# Patient Record
Sex: Female | Born: 1950
Health system: Southern US, Community
[De-identification: ages and names within clinical notes are randomized; demographics above are authoritative.]

## PROBLEM LIST (undated history)

## (undated) DIAGNOSIS — I2510 Atherosclerotic heart disease of native coronary artery without angina pectoris: Secondary | ICD-10-CM

## (undated) DIAGNOSIS — IMO0001 Reserved for inherently not codable concepts without codable children: Secondary | ICD-10-CM

## (undated) DIAGNOSIS — F419 Anxiety disorder, unspecified: Secondary | ICD-10-CM

## (undated) DIAGNOSIS — M2550 Pain in unspecified joint: Secondary | ICD-10-CM

## (undated) DIAGNOSIS — I509 Heart failure, unspecified: Secondary | ICD-10-CM

## (undated) DIAGNOSIS — G43019 Migraine without aura, intractable, without status migrainosus: Secondary | ICD-10-CM

## (undated) DIAGNOSIS — D751 Secondary polycythemia: Secondary | ICD-10-CM

## (undated) DIAGNOSIS — G43909 Migraine, unspecified, not intractable, without status migrainosus: Secondary | ICD-10-CM

## (undated) DIAGNOSIS — D649 Anemia, unspecified: Secondary | ICD-10-CM

## (undated) DIAGNOSIS — M533 Sacrococcygeal disorders, not elsewhere classified: Secondary | ICD-10-CM

## (undated) DIAGNOSIS — Z9289 Personal history of other medical treatment: Secondary | ICD-10-CM

## (undated) DIAGNOSIS — I7300 Raynaud's syndrome without gangrene: Secondary | ICD-10-CM

## (undated) DIAGNOSIS — T8859XA Other complications of anesthesia, initial encounter: Secondary | ICD-10-CM

## (undated) DIAGNOSIS — Z860100 Personal history of colon polyps, unspecified: Secondary | ICD-10-CM

## (undated) DIAGNOSIS — R200 Anesthesia of skin: Secondary | ICD-10-CM

## (undated) DIAGNOSIS — R112 Nausea with vomiting, unspecified: Secondary | ICD-10-CM

## (undated) DIAGNOSIS — I219 Acute myocardial infarction, unspecified: Secondary | ICD-10-CM

## (undated) DIAGNOSIS — Z9889 Other specified postprocedural states: Secondary | ICD-10-CM

## (undated) DIAGNOSIS — F329 Major depressive disorder, single episode, unspecified: Secondary | ICD-10-CM

## (undated) DIAGNOSIS — M797 Fibromyalgia: Secondary | ICD-10-CM

## (undated) DIAGNOSIS — K219 Gastro-esophageal reflux disease without esophagitis: Secondary | ICD-10-CM

## (undated) DIAGNOSIS — Z973 Presence of spectacles and contact lenses: Secondary | ICD-10-CM

## (undated) DIAGNOSIS — M419 Scoliosis, unspecified: Secondary | ICD-10-CM

## (undated) DIAGNOSIS — K589 Irritable bowel syndrome without diarrhea: Secondary | ICD-10-CM

## (undated) DIAGNOSIS — M2540 Effusion, unspecified joint: Secondary | ICD-10-CM

## (undated) DIAGNOSIS — F32A Depression, unspecified: Secondary | ICD-10-CM

## (undated) DIAGNOSIS — Z87442 Personal history of urinary calculi: Secondary | ICD-10-CM

## (undated) HISTORY — PX: TONSILLECTOMY AND ADENOIDECTOMY: SUR1326

## (undated) HISTORY — DX: Acute myocardial infarction, unspecified: I21.9

## (undated) HISTORY — PX: ABDOMINAL HYSTERECTOMY: SHX81

## (undated) HISTORY — PX: COLONOSCOPY: SHX174

## (undated) HISTORY — PX: KNEE DEBRIDEMENT: SHX1894

## (undated) HISTORY — PX: DEBRIDEMENT TENNIS ELBOW: SHX1442

## (undated) HISTORY — PX: DILATION AND CURETTAGE OF UTERUS: SHX78

## (undated) HISTORY — PX: STAPEDECTOMY: SHX2435

## (undated) HISTORY — PX: LAPAROTOMY: SHX154

## (undated) HISTORY — PX: TYMPANOPLASTY: SHX33

## (undated) HISTORY — PX: DIAGNOSTIC LAPAROSCOPY: SUR761

## (undated) HISTORY — PX: TUBAL LIGATION: SHX77

## (undated) HISTORY — DX: Migraine without aura, intractable, without status migrainosus: G43.019

## (undated) HISTORY — PX: CARDIAC CATHETERIZATION: SHX172

## (undated) HISTORY — PX: ELBOW SURGERY: SHX618

## (undated) HISTORY — PX: APPENDECTOMY: SHX54

---

## 1999-09-17 ENCOUNTER — Other Ambulatory Visit: Admission: RE | Admit: 1999-09-17 | Discharge: 1999-09-17 | Payer: Self-pay | Admitting: Orthopedic Surgery

## 2000-11-30 ENCOUNTER — Encounter (INDEPENDENT_AMBULATORY_CARE_PROVIDER_SITE_OTHER): Payer: Self-pay | Admitting: *Deleted

## 2000-11-30 ENCOUNTER — Ambulatory Visit (HOSPITAL_COMMUNITY): Admission: RE | Admit: 2000-11-30 | Discharge: 2000-11-30 | Payer: Self-pay | Admitting: Gastroenterology

## 2002-02-06 ENCOUNTER — Encounter: Admission: RE | Admit: 2002-02-06 | Discharge: 2002-02-06 | Payer: Self-pay | Admitting: Otolaryngology

## 2002-02-06 ENCOUNTER — Encounter: Payer: Self-pay | Admitting: Otolaryngology

## 2002-10-10 ENCOUNTER — Encounter: Payer: Self-pay | Admitting: Gastroenterology

## 2002-10-10 ENCOUNTER — Encounter: Admission: RE | Admit: 2002-10-10 | Discharge: 2002-10-10 | Payer: Self-pay | Admitting: Gastroenterology

## 2002-10-28 ENCOUNTER — Encounter (INDEPENDENT_AMBULATORY_CARE_PROVIDER_SITE_OTHER): Payer: Self-pay

## 2002-10-28 ENCOUNTER — Observation Stay (HOSPITAL_COMMUNITY): Admission: RE | Admit: 2002-10-28 | Discharge: 2002-10-29 | Payer: Self-pay | Admitting: Surgery

## 2002-10-28 HISTORY — PX: CHOLECYSTECTOMY: SHX55

## 2003-04-14 ENCOUNTER — Encounter: Payer: Self-pay | Admitting: Gastroenterology

## 2003-04-14 ENCOUNTER — Ambulatory Visit (HOSPITAL_COMMUNITY): Admission: RE | Admit: 2003-04-14 | Discharge: 2003-04-14 | Payer: Self-pay | Admitting: Gastroenterology

## 2003-04-30 ENCOUNTER — Ambulatory Visit (HOSPITAL_COMMUNITY): Admission: RE | Admit: 2003-04-30 | Discharge: 2003-04-30 | Payer: Self-pay | Admitting: Gastroenterology

## 2003-06-23 ENCOUNTER — Encounter: Payer: Self-pay | Admitting: Gastroenterology

## 2003-06-23 ENCOUNTER — Encounter: Admission: RE | Admit: 2003-06-23 | Discharge: 2003-06-23 | Payer: Self-pay | Admitting: Gastroenterology

## 2004-08-06 ENCOUNTER — Other Ambulatory Visit: Admission: RE | Admit: 2004-08-06 | Discharge: 2004-08-06 | Payer: Self-pay | Admitting: Obstetrics and Gynecology

## 2007-03-09 ENCOUNTER — Ambulatory Visit (HOSPITAL_COMMUNITY): Admission: RE | Admit: 2007-03-09 | Discharge: 2007-03-09 | Payer: Self-pay | Admitting: Gastroenterology

## 2007-03-09 ENCOUNTER — Encounter (INDEPENDENT_AMBULATORY_CARE_PROVIDER_SITE_OTHER): Payer: Self-pay | Admitting: Gastroenterology

## 2007-11-02 ENCOUNTER — Emergency Department (HOSPITAL_COMMUNITY): Admission: EM | Admit: 2007-11-02 | Discharge: 2007-11-02 | Payer: Self-pay | Admitting: Emergency Medicine

## 2008-01-08 ENCOUNTER — Encounter: Admission: RE | Admit: 2008-01-08 | Discharge: 2008-01-08 | Payer: Self-pay | Admitting: Internal Medicine

## 2008-02-29 ENCOUNTER — Emergency Department (HOSPITAL_COMMUNITY): Admission: EM | Admit: 2008-02-29 | Discharge: 2008-02-29 | Payer: Self-pay | Admitting: Emergency Medicine

## 2009-03-30 ENCOUNTER — Inpatient Hospital Stay (HOSPITAL_COMMUNITY): Admission: EM | Admit: 2009-03-30 | Discharge: 2009-03-31 | Payer: Self-pay | Admitting: Emergency Medicine

## 2010-04-28 ENCOUNTER — Encounter (HOSPITAL_COMMUNITY)
Admission: RE | Admit: 2010-04-28 | Discharge: 2010-05-28 | Payer: Self-pay | Source: Home / Self Care | Admitting: Oncology

## 2010-05-14 ENCOUNTER — Ambulatory Visit (HOSPITAL_COMMUNITY): Payer: Self-pay | Admitting: Oncology

## 2010-10-10 ENCOUNTER — Encounter: Payer: Self-pay | Admitting: Gastroenterology

## 2010-10-10 ENCOUNTER — Encounter (HOSPITAL_COMMUNITY): Payer: Self-pay | Admitting: Oncology

## 2010-12-03 LAB — COMPREHENSIVE METABOLIC PANEL
Albumin: 4.1 g/dL (ref 3.5–5.2)
Alkaline Phosphatase: 88 U/L (ref 39–117)
BUN: 13 mg/dL (ref 6–23)
Calcium: 8.9 mg/dL (ref 8.4–10.5)
Glucose, Bld: 111 mg/dL — ABNORMAL HIGH (ref 70–99)
Potassium: 4 mEq/L (ref 3.5–5.1)
Sodium: 136 mEq/L (ref 135–145)
Total Protein: 6.6 g/dL (ref 6.0–8.3)

## 2010-12-03 LAB — URINALYSIS, MICROSCOPIC ONLY
Glucose, UA: NEGATIVE mg/dL
Hgb urine dipstick: NEGATIVE
Leukocytes, UA: NEGATIVE
Specific Gravity, Urine: >1.03 — ABNORMAL HIGH
pH: 5.5

## 2010-12-03 LAB — BLOOD GAS, ARTERIAL
FIO2: 0.21 %
Patient temperature: 37
pCO2 arterial: 34.6 mmHg — ABNORMAL LOW (ref 35.0–45.0)
pH, Arterial: 7.404 — ABNORMAL HIGH (ref 7.350–7.400)

## 2010-12-03 LAB — DIFFERENTIAL
Basophils Relative: 1 % (ref 0–1)
Lymphs Abs: 2.1 10*3/uL (ref 0.7–4.0)
Monocytes Absolute: 0.5 10*3/uL (ref 0.1–1.0)
Monocytes Relative: 10 % (ref 3–12)
Neutro Abs: 2.6 10*3/uL (ref 1.7–7.7)
Neutrophils Relative %: 48 % (ref 43–77)

## 2010-12-03 LAB — CBC
MCHC: 34 g/dL (ref 30.0–36.0)
MCV: 87.8 fL (ref 78.0–100.0)
Platelets: 214 10*3/uL (ref 150–400)
RDW: 13.1 % (ref 11.5–15.5)
WBC: 5.4 10*3/uL (ref 4.0–10.5)

## 2010-12-26 LAB — COMPREHENSIVE METABOLIC PANEL
Albumin: 3.8 g/dL (ref 3.5–5.2)
Alkaline Phosphatase: 102 U/L (ref 39–117)
BUN: 11 mg/dL (ref 6–23)
BUN: 12 mg/dL (ref 6–23)
CO2: 27 mEq/L (ref 19–32)
Calcium: 7.7 mg/dL — ABNORMAL LOW (ref 8.4–10.5)
Chloride: 104 mEq/L (ref 96–112)
Creatinine, Ser: 0.72 mg/dL (ref 0.4–1.2)
Glucose, Bld: 105 mg/dL — ABNORMAL HIGH (ref 70–99)
Glucose, Bld: 174 mg/dL — ABNORMAL HIGH (ref 70–99)
Potassium: 4.5 mEq/L (ref 3.5–5.1)
Sodium: 138 mEq/L (ref 135–145)
Total Bilirubin: 0.8 mg/dL (ref 0.3–1.2)
Total Protein: 5.7 g/dL — ABNORMAL LOW (ref 6.0–8.3)

## 2010-12-26 LAB — CBC
HCT: 40 % (ref 36.0–46.0)
HCT: 45.3 % (ref 36.0–46.0)
Hemoglobin: 13.6 g/dL (ref 12.0–15.0)
MCHC: 34 g/dL (ref 30.0–36.0)
MCV: 90 fL (ref 78.0–100.0)
Platelets: 184 10*3/uL (ref 150–400)
RBC: 5.09 MIL/uL (ref 3.87–5.11)
RDW: 14 % (ref 11.5–15.5)
WBC: 7.9 10*3/uL (ref 4.0–10.5)

## 2010-12-26 LAB — SEDIMENTATION RATE: Sed Rate: 2 mm/hr (ref 0–22)

## 2010-12-26 LAB — URINE CULTURE

## 2010-12-26 LAB — LIPID PANEL
Cholesterol: 170 mg/dL (ref 0–200)
LDL Cholesterol: 99 mg/dL (ref 0–99)
Total CHOL/HDL Ratio: 2.9 RATIO
Triglycerides: 59 mg/dL (ref <150)
VLDL: 12 mg/dL (ref 0–40)

## 2010-12-26 LAB — POCT I-STAT 3, ART BLOOD GAS (G3+)
Acid-base deficit: 3 mmol/L — ABNORMAL HIGH (ref 0.0–2.0)
Bicarbonate: 25 mEq/L — ABNORMAL HIGH (ref 20.0–24.0)
O2 Saturation: 97 %
TCO2: 27 mmol/L (ref 0–100)
pO2, Arterial: 89 mmHg (ref 80.0–100.0)

## 2010-12-26 LAB — PROTIME-INR
INR: 1 (ref 0.00–1.49)
Prothrombin Time: 13 seconds (ref 11.6–15.2)

## 2010-12-26 LAB — URINALYSIS, ROUTINE W REFLEX MICROSCOPIC
Hgb urine dipstick: NEGATIVE
Nitrite: NEGATIVE
Protein, ur: NEGATIVE mg/dL
Specific Gravity, Urine: 1.025 (ref 1.005–1.030)
Urobilinogen, UA: 0.2 mg/dL (ref 0.0–1.0)

## 2010-12-26 LAB — DIFFERENTIAL
Lymphocytes Relative: 21 % (ref 12–46)
Lymphs Abs: 1.2 10*3/uL (ref 0.7–4.0)
Lymphs Abs: 1.6 10*3/uL (ref 0.7–4.0)
Monocytes Relative: 5 % (ref 3–12)
Monocytes Relative: 6 % (ref 3–12)
Neutro Abs: 6.7 10*3/uL (ref 1.7–7.7)
Neutrophils Relative %: 70 % (ref 43–77)
Neutrophils Relative %: 80 % — ABNORMAL HIGH (ref 43–77)

## 2010-12-26 LAB — BASIC METABOLIC PANEL
BUN: 13 mg/dL (ref 6–23)
GFR calc Af Amer: >60 mL/min (ref >60)
GFR calc non Af Amer: >60 mL/min (ref >60)
Potassium: 4.2 mEq/L (ref 3.5–5.1)

## 2010-12-26 LAB — GLUCOSE, CAPILLARY

## 2010-12-26 LAB — TSH: TSH: 3.191 u[IU]/mL (ref 0.350–4.500)

## 2010-12-26 LAB — LACTIC ACID, PLASMA: Lactic Acid, Venous: 1.2 mmol/L (ref 0.5–2.2)

## 2010-12-26 LAB — HEMOGLOBIN A1C: Hgb A1c MFr Bld: 5.8 % (ref 4.6–6.1)

## 2010-12-26 LAB — CULTURE, BLOOD (ROUTINE X 2): Culture: NO GROWTH

## 2010-12-26 LAB — MRSA PCR SCREENING: MRSA by PCR: NEGATIVE

## 2010-12-29 ENCOUNTER — Inpatient Hospital Stay (INDEPENDENT_AMBULATORY_CARE_PROVIDER_SITE_OTHER)
Admission: RE | Admit: 2010-12-29 | Discharge: 2010-12-29 | Disposition: A | Discharge status: Home / Self Care | Payer: 59 | Source: Ambulatory Visit | Attending: Emergency Medicine | Admitting: Emergency Medicine

## 2010-12-29 DIAGNOSIS — J019 Acute sinusitis, unspecified: Secondary | ICD-10-CM

## 2010-12-29 DIAGNOSIS — J40 Bronchitis, not specified as acute or chronic: Secondary | ICD-10-CM

## 2010-12-29 DIAGNOSIS — J309 Allergic rhinitis, unspecified: Secondary | ICD-10-CM

## 2011-02-01 NOTE — Discharge Summary (Signed)
NAMEJAWANDA, Stacey Lawrence              ACCOUNT NO.:  [id number]   MEDICAL RECORD NO.:  [id number]          PATIENT TYPE:  INP   LOCATION:  2105                         FACILITY:  [facility]   PHYSICIAN:  Michelene Gardener, MD    DATE OF BIRTH:  11-Dec-1950   DATE OF ADMISSION:  03/30/2009  DATE OF DISCHARGE:  03/31/2009                               DISCHARGE SUMMARY   DISCHARGE DIAGNOSES:  1. Hypothermia of unknown etiology.  2. Right ureteric stone with mild to moderate hydroureter and      hydronephrosis and patient passed the stone during this      hospitalization.  3. Chronic hypotension with baseline systolic blood pressure in the      90s.  4. First-degree heart block.  5. Fibromyalgia.  6. History of diabetes mellitus which is rate controlled.   DISCHARGE MEDICATIONS:  Patient will go home on all home medications  without new admissions that include:  1. Flexeril 10 mg 3 times daily as needed.  2. Phenergan 25 mg every 6 hours as needed.  3. Zoloft 50 mg once a day.  4. Pamelor 25 mg at bedtime.  5. Timolol 10 mg twice daily.  Timolol will be discontinued.   CONSULTATIONS:  None.   PROCEDURES:  None.   DIAGNOSTIC STUDIES:  1. CT scan of the abdomen without contrast showed mild to moderate      right hydronephrosis and hydroureter and a 1.5-cm right lobe liver      mass.  2. CT scan of the pelvis without contrast showed 4-mm distal right      ureteral calculus.  3. Abdominal ultrasound on March 30, 2009, showed a right lobe liver      1.6 x 1.5-cm echogenic mass suggestive of hemangioma.  Follow up      with the primary doctor within a week.   COURSE OF HOSPITALIZATION:  1. Hypothermia.  This is of unclear etiology and that only lasted for      a short period of time following her vomiting.  The patient was      monitored in the ICU and did not require further intervention.  2. Nephrolithiasis with right hydroureter and right hydronephrosis.      Patient has been treated  with IV fluids and pain medications.  She      passed the stone around 3 a.m. on March 31, 2009.  Prior to that,      she underwent evaluation with CT scan of the pelvis, abdomen, and      abdominal ultrasound and detailed results were mentioned above.      Currently, she is totally asymptomatic and does not require pain      medications.  3. First-degree heart block, might be related to beta-blockers.  I      stopped her timolol and I suggested that she get an echocardiogram.      Patient refused to get the echocardiogram at the present and she      preferred to do that with her primary doctor.  4. Liver mass.  This is most likely hemangioma.  It  was seen initially      in her CT scan of the abdomen and pelvis and then was further      evaluated by abdominal ultrasound and it showed hemangioma.   Otherwise, other medical conditions remained stable.  Patient will be  discharged home today and will follow with her primary doctor within a  week.   TOTAL ASSESSMENT TIME:  40 minutes.      Michelene Gardener, MD  Electronically Signed     NAE/MEDQ  D:  03/31/2009  T:  03/31/2009  Job:  [id number]

## 2011-02-01 NOTE — H&P (Signed)
NAMELAVEAH, GLOSTER NO.:  [id number]   MEDICAL RECORD NO.:  [id number]          PATIENT TYPE:  INP   LOCATION:  2105                         FACILITY:  [facility]   PHYSICIAN:  Oswald Hillock, MD        DATE OF BIRTH:  [birth year]   DATE OF ADMISSION:  03/30/2009  DATE OF DISCHARGE:                              HISTORY & PHYSICAL   CHIEF COMPLAINT:  Right flank pain.   HISTORY OF PRESENT ILLNESS:  The patient is a 60 year old Caucasian  female with known history of fibromyalgia, migraine headaches, and  irritable bowel syndrome who presents to the emergency room with  complaints of right flank pain that started earlier today, intermittent  in nature, radiating to the right lower quadrant.  Pain is described as  severe by the patient associated with nausea and vomiting.  No reports  of any chest pain, shortness of breath, palpitations, dizziness, loss of  consciousness, or any focal weakness of any part of the body.  No  history of similar episodes in the past.   PAST MEDICAL HISTORY:  Significant for:  1. Fibromyalgia.  2. Irritable bowel syndrome.  3. Sjogren disease.  4. Migraine headaches.   PAST SURGICAL HISTORY:  1. Appendectomy.  2. Hysterectomy.   CURRENT MEDICATIONS:  1. Flexeril 10 mg 3 times a day.  2. Phenergan 25 mg every 6 hours as needed.  3. Zoloft 50 mg at bedtime.  4. Pamelor 25 mg bedtime.  5. Timolol 10 mg 1 p.o. b.i.d.   SOCIAL HISTORY:  The patient denies any alcohol, tobacco, or drug use.  Lives with family.   ALLERGIES:  CODEINE.   FAMILY HISTORY:  Maternal grandmother has a history of hypothyroidism.  No history of diabetes, coronary artery disease, or hypertension in the  family.  No history of kidney stones in the family either.   REVIEW OF SYSTEMS:  An extensive review of system was done.  All systems  are negative except for the positives mentioned in the history of  present illness.  The patient does admit to being  borderline diabetic.   EMERGENCY ROOM COURSE:  Initial eval in the ER included a CT scan of the  abdomen and pelvis that revealed a right lobe liver mass measuring about  1.5 cm in addition to 4-mm distal right ureteral calculus causing mild-  to-moderate right hydronephrosis and hydroureter.  The patient developed  hypothermia in the ER as her temperature drop down to 93.  She was put  on a Lawyer and at the time of this interview continues to be  hypothermic.   PHYSICAL EXAMINATION:  VITAL SIGNS:  On admission, pulse 49, blood  pressure 147/77, respiratory rate of 16, and temperature was initially  97 and then dropped to 93 rectally.  GENERAL:  The patient is somnolent though easily arousable, alert and  oriented to time, place, and person, in no acute distress at the time of  this interview.  HEENT:  No scleral icterus.  No pallor.  Ears negative.  No significant  oropharyngeal lesions.  NECK:  Supple.  No  lymphadenopathy.  No JVD.  No carotid bruits.  CHEST:  Breath sounds heard bilaterally.  Good air entry and diminished  breath sounds at bases.  No added sounds.  CVS:  S1 and S2 plus regular bradycardia.  No gallop, rub, or murmur  appreciated.  ABDOMEN:  Soft, nontender, and nondistended.  Bowel sounds are present.  No hepatosplenomegaly.  EXTREMITIES:  No cyanosis, clubbing, or edema.  NEUROLOGIC:  Cranial nerves II through XII are grossly intact.  The  patient has no focal motor or sensory deficits on gross examination.  Plantars are bilaterally downgoing.   LABORATORY DATA:  Her sodium is 136, potassium 4.2, chloride 104, CO2 of  26, glucose 191, BUN 13, and creatinine 0.92.  WBC count is 7.9,  hemoglobin 15.6, hematocrit 45.3, and platelet count of 184.  Urine  pregnancy was negative.  Her CT abdomen and pelvis showed mild-to-  moderate right hydronephrosis and hydroureter, 1.5-cm lobe liver mass,  mild bilateral atelectasis or scarring, and 4-mm distal right  ureteral  calculus.  Urinalysis was negative.  EKG showed sinus rhythm with first-  degree AV block, poor R-wave progression in anterior leads, RSR pattern  in leads V1 and V2 consistent with the right bundle-branch block.  No  changes consistent with hypothermia noted on EKG.   IMPRESSION AND PLAN:  This is a case of 60 year old Caucasian female who  presents with right ureterocolic with hydronephrosis and hydroureter and  significant hypothermia.  1. Hypothermia.  Etiology is unclear.  However, the patient did      mention that she has had spells wherein she has felt her body to be      very cold in the past but never been hospitalized or evaluated for      it.  She is bradycardic though that could be likely from the beta-      blocker, timolol, that she is on twice a day for migraine      prophylaxis.  We will order a panculture.  She will empirically get      a dose of ciprofloxacin and we will monitor in the step-down unit      overnight.  She is on a Lawyer at this time and we will      continue that until her temperature is above 98.5.  She does have a      liver mass which will be qualified further by getting an ultrasound      and we will follow up with the results.  We will also check      baseline labs including a TSH, ESR, CRP level, lactic acid level,      and PT/INR in addition to baseline arterial blood gas.  We will      also get a cortisone level and follow up with the results.  2. Right ureterocolic.  We will need to consult Urology in the morning      for their input.  However, for now, we will hold off on putting her      on prednisone.  We will use Vicodin and/or Dilaudid for pain      control.  3. Fibromyalgia.  Continue current medications.  4. Hyperglycemia/diabetes mellitus.  The patient has had history of      elevated blood sugars in the recent past.  We will put her on      sliding scale insulin coverage with Regular insulin sensitive scale      and  check hemoglobin  A1c.  5. Deep vein thrombosis/seizure prophylaxis.  Protonix and subcu      heparin.  6. Code status.  Full code.   Case was discussed in detail with the patient and her husband who is  present at bedside.  The differential of her current condition, i.e.,  hypothymia was discussed in detail and the seriousness of the condition  was made clear to the family.      Oswald Hillock, MD  Electronically Signed     BA/MEDQ  D:  03/30/2009  T:  03/31/2009  Job:  [id number]

## 2011-02-01 NOTE — Op Note (Signed)
NAME:  Stacey Lawrence, Stacey Lawrence             ACCOUNT NO.:  [id number]   MEDICAL RECORD NO.:  [id number]          PATIENT TYPE:  AMB   LOCATION:  ENDO                         FACILITY:  [facility]   PHYSICIAN:  Anselmo Rod, M.D.  DATE OF BIRTH:  12/04/1950   DATE OF PROCEDURE:  03/09/2007  DATE OF DISCHARGE:                               OPERATIVE REPORT   PROCEDURE PERFORMED:  Esophagogastroduodenoscopy with cold biopsies x 3.   ENDOSCOPIST:  Dr. Anselmo Rod.   INSTRUMENT USED:  Pentax video panendoscope.   INDICATIONS FOR PROCEDURE:  60 year old white female undergoing EGD for  abdominal pain, epigastric burning not responding to PPI, rule out  peptic ulcer disease, esophagitis, gastritis, etc.   PREPROCEDURE PREPARATION:  Informed consent was procured from the  patient.  The patient was fasted for 8 hours prior to the procedure.  Risks and benefits of the procedure were discussed with the patient in  detail.   PREPROCEDURE PHYSICAL:  The patient had stable vital signs.  NECK:  Supple.  Chest clear to auscultation. S1 and S2 regular.  Abdomen soft  with normal bowel sounds.   DESCRIPTION OF PROCEDURE:  The patient was placed in the left lateral  decubitus position and sedated with 50 mcg of Fentanyl and 6 mg of  Versed given intravenously in slow incremental doses. Once the patient  was adequately sedated and maintained on low-flow oxygen and continuous  cardiac monitoring the Pentax video panendoscope was advanced through  the mouthpiece over the tongue into the esophagus under direct vision.  The entire esophagus widely patent with no evidence of ring, stricture,  mass, esophagitis, Barrett's mucosa.  The Z-line appeared healthy.  The  scope was then advanced into the stomach.  A small hiatal hernia was  seen on high retroflexion. Mild gastritis was noted.  Biopsies were done  to rule out presence of H pylori by pathology.  The proximal small bowel  appeared normal.  No ulcers or  masses were seen.  There were no polyps  noted.  A couple of antral erosions were present.  The patient tolerated  the procedure well without complications.   IMPRESSION:  1. Healthy appearing esophagus and Z-line. No stricture, esophagitis      or Barrett's mucosa noted.  2. Small hiatal hernia seen on high retroflexion.  3. Few antral erosions with mild gastritis.  Biopsies done for H      pylori.  4. Normal proximal small bowel.   RECOMMENDATIONS:  1. Continue PPI's.  2. Avoid nonsteroidals.  3. Antibiotics if H pylori present on biopsies.  4. Outpatient follow-up in the next 4 weeks, earlier if need be.      Anselmo Rod, M.D.  Electronically Signed     JNM/MEDQ  D:  03/09/2007  T:  03/09/2007  Job:  [id number]   cc:   Olene Craven, M.D.  Fax: [phone]

## 2011-02-04 NOTE — Op Note (Signed)
NAME:  Stacey Lawrence, Stacey Lawrence                       ACCOUNT NO.:  [id number]   MEDICAL RECORD NO.:  [id number]                   PATIENT TYPE:  AMB   LOCATION:  ENDO                                 FACILITY:  [facility]   PHYSICIAN:  Anselmo Rod, M.D.               DATE OF BIRTH:  August 18, 1951   DATE OF PROCEDURE:  04/30/2003  DATE OF DISCHARGE:                                 OPERATIVE REPORT   PROCEDURE PERFORMED:  Colonoscopy.   ENDOSCOPIST:  Anselmo Rod, M.D.   INSTRUMENT USED:  Olympus video colonoscope.   INDICATION FOR PROCEDURE:  Family history of colon cancer in mother and  history of rectal bleeding with personal history of colonic polyps in a 61-  year-old white female.  Rule out recurrent polyps.   PREPROCEDURE PREPARATION:  Informed consent was procured from the patient.  The patient was fasted for eight hours prior to the procedure and prepped  with a bottle of magnesium citrate and a gallon of GoLYTELY the night prior  to the procedure.   PREPROCEDURE PHYSICAL:  VITAL SIGNS:  The patient had stable vital signs.  NECK:  Supple.  CHEST:  Clear to auscultation.  S1, S2 regular.  ABDOMEN:  Soft with some right lower quadrant tenderness on palpation with  guarding, no rebound or rigidity, no hepatosplenomegaly.   DESCRIPTION OF PROCEDURE:  The patient was placed in the left lateral  decubitus position.  Given 80 mg of Demerol and 8 mg of Versed  intravenously.  Once the patient was adequately sedate and maintained on low-  flow oxygen and continuous cardiac monitoring, the Olympus video colonoscope  was advanced from the rectum to the cecum.  The patient had a fairly good  prep.  The appendiceal orifice and the ileocecal valve were clearly  visualized and photographed.  A diverticulum was seen in the left colon.  Small internal and external hemorrhoids were present.  No masses or polyps  were seen.  The patient tolerated the procedure well without complication.   IMPRESSION:  1. Small internal and external hemorrhoid.  2. Isolated diverticulum in the left colon.  3. No masses or polyps seen.    RECOMMENDATIONS:  1. A high-fiber diet with liberal fluid intake.  2. Repeat CRC screening in the next five years unless the patient develops     any abnormal symptoms in the interim.  3. Outpatient follow-up in the next two weeks for further recommendations.                                               Anselmo Rod, M.D.    JNM/MEDQ  D:  04/30/2003  T:  05/01/2003  Job:  [id number]   cc:   Olene Craven, M.D.  [address]  [address]  [place]  [place] [address]  Fax: [phone]

## 2011-02-04 NOTE — Procedures (Signed)
[place]  Patient:    Stacey Lawrence, Stacey Lawrence                       MRN: [id number] Proc. Date: 11/30/00 Adm. Date:  [id number] Attending:  Charna Elizabeth CC:         Keturah Barre, M.D.   Procedure Report  DATE OF BIRTH: [birth year]  PROCEDURE: Colonoscopy with hot biopsy x 2.  ENDOSCOPIST: Anselmo Rod, M.D.  INSTRUMENT USED: Olympus video colonoscope.  INDICATIONS FOR PROCEDURE: Occasional blood in stool with change in bowel habits in a 60 year old white female with a history of severe IBS.  Rule out colonic polyps, masses, hemorrhoids, etc.  PREPROCEDURE PREPARATION: Informed consent was procured from the patient.  The patient fasted for eight hours prior to the procedure and was prepped with a bottle of magnesium citrate and a gallon of NuLytely the night prior to the procedure.  PREPROCEDURE PHYSICAL EXAMINATION:  VITAL SIGNS: Stable.  NECK: Supple.  CHEST: Clear to auscultation.  HEART: S1 and S2, regular.  ABDOMEN: Soft, normal bowel sounds.  DESCRIPTION OF PROCEDURE: The patient was placed in the left lateral decubitus position and sedated with 50 mg of Demerol and 5 mg of Versed intravenously. Once the patient was adequately sedated and maintained on low-flow oxygen and continuous cardiac monitoring the Olympus video colonoscope was advanced from the rectum to the cecum and terminal ileum without difficulty.  The entire colonic mucosa appeared healthy.  A small flat polyp was seen at 30 cm and this was biopsied for pathology.  There were no other masses, polyps, erosions, ulcerations, or diverticula seen.  The patient tolerated the procedure well without complications.  Small internal hemorrhoids were appreciated on retroflexion in the rectum.  IMPRESSION:  1. Normal colonoscopy up to terminal ileum except for small flap polyp     removed by hot biopsy from 30 cm.  2. Small nonbleeding internal  hemorrhoids.  RECOMMENDATIONS:  1. I suspect the patient has a problem with irritable bowel syndrome and a     high fiber diet with liberal fluid intake has been advocated.  2. Await pathology results.  3. Repeat colorectal cancer screening depending on pathology results.  4. Outpatient follow-up in the next two to four weeks. DD:  11/30/00 TD:  12/01/00 Job: [id number]

## 2011-02-04 NOTE — Op Note (Signed)
NAME:  Stacey Lawrence, Stacey Lawrence                       ACCOUNT NO.:  [id number]   MEDICAL RECORD NO.:  [id number]                   PATIENT TYPE:  AMB   LOCATION:  DAY                                  FACILITY:  [facility]   PHYSICIAN:  Abigail Miyamoto, M.D.              DATE OF BIRTH:  12-20-1950   DATE OF PROCEDURE:  10/28/2002  DATE OF DISCHARGE:                                 OPERATIVE REPORT   PREOPERATIVE DIAGNOSIS:  Symptomatic cholelithiasis.   POSTOPERATIVE DIAGNOSIS:  Symptomatic cholelithiasis.   OPERATION PERFORMED:  Laparoscopic cholecystectomy.   SURGEON:  Douglas A. Magnus Ivan, M.D.   ASSISTANT:  Rose Phi. Maple Hudson, M.D.   ANESTHESIA:  General endotracheal.   ESTIMATED BLOOD LOSS:  Minimal.   OPERATIVE FINDINGS:  The patient was found to have a chronically scarred-  appearing gallbladder with small gallstones.   DESCRIPTION OF PROCEDURE:  The patient was brought to the operating room and  identified.  She was placed supine on the operating table and general  anesthesia was induced.  Her abdomen was then prepped and draped in the  usual sterile fashion.  Using a #15 blade, a small transverse incision was  made below the umbilicus through a previous incision.  The incision was  carried down through the fascia which was then opened with a scalpel.  A  hemostat was then used to pass into the peritoneal cavity.  Next, a 0 Vicryl  pursestring suture was placed around the fascia opening.  The Hasson port  was placed through the opening and insufflation of the abdomen was begun.  Next, an 11 mm port was placed in the patient's epigastrium and two 5 mm  ports were placed in the patient's right flank under direct vision.  The  gallbladder was then grasped and retracted above the liver bed.  Dissection  was then carried out at the base of the gallbladder.  The cystic duct was  then easily dissected out and then clipped three times proximally and once  distally and transected with  scissors.  The cystic artery was identified and  clipped twice proximally and once distally and transected as well.  The  gallbladder was then easily dissected free from the liver bed with the  electrocautery.  Hemostasis appeared to be achieved in the liver bed with  the cautery.  Once the gallbladder was freed from the liver bed, it was  pulled out through the incision at the umbilicus.  The 0 Vicryl at the  umbilicus was tied in place closing the fascial defects.  Again, the liver  bed was examined and hemostasis was achieved.  All ports were then removed  under direct vision and the abdomen was deflated.  All incisions were then  anesthetized with 0.25% Marcaine and then closed with 4-0 Monocryl  subcuticular sutures.  Steri-Strips, gauze and tape were then applied.  The  patient tolerated the procedure well.  All sponge, needle and  instrument  counts were correct at the end of the procedure.  The patient was then  extubated in the operating room and taken in stable condition to the  recovery room.                                                   Abigail Miyamoto, M.D.    DB/MEDQ  D:  10/28/2002  T:  10/28/2002  Job:  [id number]   cc:   Anselmo Rod, M.D.  [address]  [address]  [place]  [place] [address]  Fax: [phone]   Olene Craven, M.D.  [address]  [address]  [place]  [place] [address]  Fax: [phone]

## 2011-05-18 ENCOUNTER — Ambulatory Visit (HOSPITAL_COMMUNITY): Payer: Self-pay | Admitting: Oncology

## 2011-06-16 LAB — POCT I-STAT, CHEM 8
BUN: 16
Calcium, Ion: 1.2
Chloride: 105
Creatinine, Ser: 1
Glucose, Bld: 91
HCT: 44
Hemoglobin: 15
Potassium: 4.2
Sodium: 138
TCO2: 27

## 2011-06-16 LAB — DIFFERENTIAL
Basophils Absolute: 0.1
Eosinophils Relative: 3
Lymphocytes Relative: 37
Monocytes Absolute: 0.6
Monocytes Relative: 8

## 2011-06-16 LAB — CBC
HCT: 42.5
Hemoglobin: 14.3
MCHC: 33.6
RBC: 4.89
RDW: 13.1

## 2011-07-04 ENCOUNTER — Other Ambulatory Visit (HOSPITAL_COMMUNITY): Payer: Self-pay | Admitting: Obstetrics and Gynecology

## 2011-07-04 DIAGNOSIS — Z139 Encounter for screening, unspecified: Secondary | ICD-10-CM

## 2011-07-25 ENCOUNTER — Ambulatory Visit (HOSPITAL_COMMUNITY)
Admission: RE | Admit: 2011-07-25 | Discharge: 2011-07-25 | Disposition: A | Discharge status: Home / Self Care | Payer: 59 | Source: Ambulatory Visit | Attending: Obstetrics and Gynecology | Admitting: Obstetrics and Gynecology

## 2011-07-25 DIAGNOSIS — Z139 Encounter for screening, unspecified: Secondary | ICD-10-CM

## 2011-07-25 DIAGNOSIS — Z1231 Encounter for screening mammogram for malignant neoplasm of breast: Secondary | ICD-10-CM | POA: Insufficient documentation

## 2011-07-27 ENCOUNTER — Encounter: Payer: Self-pay | Admitting: *Deleted

## 2011-07-27 ENCOUNTER — Emergency Department (HOSPITAL_COMMUNITY)
Admission: EM | Admit: 2011-07-27 | Discharge: 2011-07-27 | Disposition: A | Discharge status: Home / Self Care | Payer: 59 | Source: Home / Self Care | Attending: Family Medicine | Admitting: Family Medicine

## 2011-07-27 ENCOUNTER — Emergency Department (INDEPENDENT_AMBULATORY_CARE_PROVIDER_SITE_OTHER): Payer: 59

## 2011-07-27 DIAGNOSIS — S5002XA Contusion of left elbow, initial encounter: Secondary | ICD-10-CM

## 2011-07-27 DIAGNOSIS — S139XXA Sprain of joints and ligaments of unspecified parts of neck, initial encounter: Secondary | ICD-10-CM

## 2011-07-27 DIAGNOSIS — S5000XA Contusion of unspecified elbow, initial encounter: Secondary | ICD-10-CM

## 2011-07-27 DIAGNOSIS — S161XXA Strain of muscle, fascia and tendon at neck level, initial encounter: Secondary | ICD-10-CM

## 2011-07-27 HISTORY — DX: Fibromyalgia: M79.7

## 2011-07-27 MED ORDER — CYCLOBENZAPRINE HCL 5 MG PO TABS: 5.0000 mg | ORAL_TABLET | Freq: Three times a day (TID) | ORAL | Status: AC | PRN

## 2011-07-27 MED ORDER — NAPROXEN 500 MG PO TABS: 500.0000 mg | ORAL_TABLET | Freq: Two times a day (BID) | ORAL | Status: DC

## 2011-07-27 NOTE — ED Provider Notes (Signed)
History     CSN: [id number] Arrival date & time: 07/27/2011  4:02 PM   First MD Initiated Contact with Patient 07/27/11 1645      Chief Complaint  Patient presents with  . Arm Injury  . Neck Injury    (Consider location/radiation/quality/duration/timing/severity/associated sxs/prior treatment) Patient is a 60 y.o. female presenting with arm injury and neck injury. The history is provided by the patient.  Arm Injury  The incident occurred more than 2 days ago. The incident occurred at home. The injury mechanism was a fall. There is an injury to the left shoulder and left elbow. The pain is moderate. Associated symptoms include nausea, neck pain and tingling. Pertinent negatives include no visual disturbance, no headaches, no focal weakness and no weakness. The neck pain is moderate. The quality of the neck pain is shooting. There is left-sided and posterior neck pain.  Neck Injury Pertinent negatives include no headaches.    Past Medical History  Diagnosis Date  . Fibromyalgia     Past Surgical History  Procedure Date  . Tonsillectomy   . Knee arthroscopy   . Cholecystectomy     History reviewed. No pertinent family history.  History  Substance Use Topics  . Smoking status: Never Smoker   . Smokeless tobacco: Not on file  . Alcohol Use: No    OB History    Grav Para Term Preterm Abortions TAB SAB Ect Mult Living                  Review of Systems  HENT: Positive for neck pain.   Eyes: Negative for visual disturbance.  Gastrointestinal: Positive for nausea.  Neurological: Positive for tingling. Negative for focal weakness, weakness and headaches.    Allergies  Codeine and Dilaudid  Home Medications   Current Outpatient Rx  Name Route Sig Dispense Refill  . IBUPROFEN 200 MG PO TABS Oral Take 200 mg by mouth every 6 (six) hours as needed.        BP 139/70  Pulse 72  Temp(Src) 98.1 F (36.7 C) (Oral)  Resp 17  SpO2 98%  Physical Exam    Constitutional: She is oriented to person, place, and time. She appears well-developed and well-nourished.  HENT:  Head: Normocephalic and atraumatic.  Neck: Muscular tenderness present. No spinous process tenderness present. Decreased range of motion present. No edema present.  Musculoskeletal:       Left shoulder: She exhibits pain. She exhibits normal range of motion and normal strength.  Neurological: She is alert and oriented to person, place, and time. She has normal strength. No cranial nerve deficit or sensory deficit. GCS eye subscore is 4. GCS verbal subscore is 5. GCS motor subscore is 6.    ED Course  Procedures (including critical care time)  Labs Reviewed - No data to display No results found.   No diagnosis found.    MDM  Cervical xrays: no acute findings LEFT elbow xray: no acute findings        Richardo Priest, MD 07/27/11 1750

## 2011-07-27 NOTE — ED Notes (Signed)
Pt fell at home on Sunday and injured neck, left arm, and left leg.  Pt states increasing pain and stiffness in left neck with shooting pain and occasional numbness in left arm.

## 2011-08-16 ENCOUNTER — Encounter (HOSPITAL_BASED_OUTPATIENT_CLINIC_OR_DEPARTMENT_OTHER): Payer: Self-pay | Admitting: *Deleted

## 2011-08-16 NOTE — Pre-Procedure Instructions (Signed)
EKG req. from Dr. Jennye Boroughs office

## 2011-08-19 ENCOUNTER — Ambulatory Visit (HOSPITAL_BASED_OUTPATIENT_CLINIC_OR_DEPARTMENT_OTHER): Payer: 59 | Admitting: Certified Registered"

## 2011-08-19 ENCOUNTER — Encounter (HOSPITAL_BASED_OUTPATIENT_CLINIC_OR_DEPARTMENT_OTHER): Payer: Self-pay

## 2011-08-19 ENCOUNTER — Encounter (HOSPITAL_BASED_OUTPATIENT_CLINIC_OR_DEPARTMENT_OTHER)
Admission: RE | Disposition: A | Discharge status: Home / Self Care | Payer: Self-pay | Source: Ambulatory Visit | Attending: Orthopedic Surgery

## 2011-08-19 ENCOUNTER — Encounter (HOSPITAL_BASED_OUTPATIENT_CLINIC_OR_DEPARTMENT_OTHER): Payer: Self-pay | Admitting: Certified Registered"

## 2011-08-19 ENCOUNTER — Ambulatory Visit (HOSPITAL_BASED_OUTPATIENT_CLINIC_OR_DEPARTMENT_OTHER)
Admission: RE | Admit: 2011-08-19 | Discharge: 2011-08-19 | Disposition: A | Discharge status: Home / Self Care | Payer: 59 | Source: Ambulatory Visit | Attending: Orthopedic Surgery | Admitting: Orthopedic Surgery

## 2011-08-19 DIAGNOSIS — M65849 Other synovitis and tenosynovitis, unspecified hand: Secondary | ICD-10-CM | POA: Insufficient documentation

## 2011-08-19 DIAGNOSIS — R51 Headache: Secondary | ICD-10-CM | POA: Insufficient documentation

## 2011-08-19 DIAGNOSIS — K219 Gastro-esophageal reflux disease without esophagitis: Secondary | ICD-10-CM | POA: Insufficient documentation

## 2011-08-19 DIAGNOSIS — IMO0001 Reserved for inherently not codable concepts without codable children: Secondary | ICD-10-CM | POA: Insufficient documentation

## 2011-08-19 DIAGNOSIS — M65839 Other synovitis and tenosynovitis, unspecified forearm: Secondary | ICD-10-CM | POA: Insufficient documentation

## 2011-08-19 HISTORY — DX: Other specified postprocedural states: R11.2

## 2011-08-19 HISTORY — PX: TRIGGER FINGER RELEASE: SHX641

## 2011-08-19 HISTORY — DX: Gastro-esophageal reflux disease without esophagitis: K21.9

## 2011-08-19 HISTORY — DX: Other specified postprocedural states: Z98.890

## 2011-08-19 SURGERY — RELEASE, A1 PULLEY, FOR TRIGGER FINGER
Anesthesia: Monitor Anesthesia Care | Site: Hand | Laterality: Right | Wound class: Clean

## 2011-08-19 MED ORDER — BUPIVACAINE HCL (PF) 0.25 % IJ SOLN
INTRAMUSCULAR | Status: DC | PRN
  Administered 2011-08-19: 5 mg

## 2011-08-19 MED ORDER — DROPERIDOL 2.5 MG/ML IJ SOLN: 0.6250 mg | INTRAMUSCULAR | Status: DC | PRN

## 2011-08-19 MED ORDER — FENTANYL CITRATE 0.05 MG/ML IJ SOLN: 100.0000 ug | INTRAMUSCULAR | Status: DC | PRN

## 2011-08-19 MED ORDER — PROPOFOL 10 MG/ML IV EMUL
INTRAVENOUS | Status: DC | PRN
  Administered 2011-08-19: 130 ug/kg/min via INTRAVENOUS

## 2011-08-19 MED ORDER — HYDROCODONE-ACETAMINOPHEN 5-500 MG PO TABS: 2.0000 | ORAL_TABLET | ORAL | Status: AC | PRN

## 2011-08-19 MED ORDER — MIDAZOLAM HCL 5 MG/5ML IJ SOLN
INTRAMUSCULAR | Status: DC | PRN
  Administered 2011-08-19: 2 mg via INTRAVENOUS

## 2011-08-19 MED ORDER — HYDROMORPHONE HCL PF 1 MG/ML IJ SOLN: 0.2500 mg | INTRAMUSCULAR | Status: DC | PRN

## 2011-08-19 MED ORDER — CEFAZOLIN SODIUM 1-5 GM-% IV SOLN
1.0000 g | Freq: Once | INTRAVENOUS | Status: AC
  Administered 2011-08-19: 1 g via INTRAVENOUS

## 2011-08-19 MED ORDER — LACTATED RINGERS IV SOLN
INTRAVENOUS | Status: DC
  Administered 2011-08-19: 07:00:00 via INTRAVENOUS

## 2011-08-19 MED ORDER — ONDANSETRON HCL 4 MG/2ML IJ SOLN
INTRAMUSCULAR | Status: DC | PRN
  Administered 2011-08-19: 4 mg via INTRAVENOUS

## 2011-08-19 MED ORDER — TRAMADOL HCL 50 MG PO TABS: ORAL_TABLET | ORAL | Status: DC

## 2011-08-19 MED ORDER — FENTANYL CITRATE 0.05 MG/ML IJ SOLN
INTRAMUSCULAR | Status: DC | PRN
  Administered 2011-08-19: 25 ug via INTRAVENOUS

## 2011-08-19 MED ORDER — LIDOCAINE HCL (PF) 1 % IJ SOLN
INTRAMUSCULAR | Status: DC | PRN
  Administered 2011-08-19: 5 mL via SUBCUTANEOUS

## 2011-08-19 SURGICAL SUPPLY — 51 items
APL SKNCLS STERI-STRIP NONHPOA (GAUZE/BANDAGES/DRESSINGS) ×1
BANDAGE CONFORM 3  STR LF (GAUZE/BANDAGES/DRESSINGS) ×2 IMPLANT
BANDAGE ELASTIC 3 VELCRO ST LF (GAUZE/BANDAGES/DRESSINGS) ×2 IMPLANT
BENZOIN TINCTURE PRP APPL 2/3 (GAUZE/BANDAGES/DRESSINGS) ×2 IMPLANT
BLADE SURG 15 STRL LF DISP TIS (BLADE) ×2 IMPLANT
BLADE SURG 15 STRL SS (BLADE) ×4
BRUSH SCRUB EZ PLAIN DRY (MISCELLANEOUS) ×2 IMPLANT
CLOTH BEACON ORANGE TIMEOUT ST (SAFETY) ×2 IMPLANT
CORDS BIPOLAR (ELECTRODE) ×1 IMPLANT
COVER MAYO STAND STRL (DRAPES) ×2 IMPLANT
COVER TABLE BACK 60X90 (DRAPES) ×2 IMPLANT
CUFF TOURNIQUET SINGLE 18IN (TOURNIQUET CUFF) IMPLANT
DRAPE EXTREMITY T 121X128X90 (DRAPE) ×2 IMPLANT
DRAPE SURG 17X23 STRL (DRAPES) ×2 IMPLANT
DRSG EMULSION OIL 3X3 NADH (GAUZE/BANDAGES/DRESSINGS) ×2 IMPLANT
GAUZE SPONGE 4X4 16PLY XRAY LF (GAUZE/BANDAGES/DRESSINGS) IMPLANT
GLOVE BIOGEL M STRL SZ7.5 (GLOVE) ×2 IMPLANT
GLOVE EXAM NITRILE MD LF STRL (GLOVE) ×1 IMPLANT
GLOVE SKINSENSE NS SZ7.0 (GLOVE) ×1
GLOVE SKINSENSE STRL SZ7.0 (GLOVE) IMPLANT
GLOVE SS BIOGEL STRL SZ 8 (GLOVE) ×1 IMPLANT
GLOVE SUPERSENSE BIOGEL SZ 8 (GLOVE) ×1
GOWN PREVENTION PLUS XLARGE (GOWN DISPOSABLE) ×3 IMPLANT
GOWN PREVENTION PLUS XXLARGE (GOWN DISPOSABLE) ×1 IMPLANT
NDL HYPO 25X1 1.5 SAFETY (NEEDLE) ×1 IMPLANT
NDL SAFETY ECLIPSE 18X1.5 (NEEDLE) IMPLANT
NEEDLE HYPO 18GX1.5 SHARP (NEEDLE) ×2
NEEDLE HYPO 25X1 1.5 SAFETY (NEEDLE) ×4 IMPLANT
NS IRRIG 1000ML POUR BTL (IV SOLUTION) ×2 IMPLANT
PACK BASIN DAY SURGERY FS (CUSTOM PROCEDURE TRAY) ×2 IMPLANT
PAD CAST 3X4 CTTN HI CHSV (CAST SUPPLIES) ×2 IMPLANT
PADDING CAST ABS 3INX4YD NS (CAST SUPPLIES)
PADDING CAST ABS 4INX4YD NS (CAST SUPPLIES) ×1
PADDING CAST ABS COTTON 3X4 (CAST SUPPLIES) IMPLANT
PADDING CAST ABS COTTON 4X4 ST (CAST SUPPLIES) ×1 IMPLANT
PADDING CAST COTTON 3X4 STRL (CAST SUPPLIES) ×2
SPLINT PLASTER CAST XFAST 3X15 (CAST SUPPLIES) IMPLANT
SPLINT PLASTER XTRA FASTSET 3X (CAST SUPPLIES)
SPONGE GAUZE 4X4 12PLY (GAUZE/BANDAGES/DRESSINGS) ×1 IMPLANT
STOCKINETTE 4X48 STRL (DRAPES) ×2 IMPLANT
STOCKINETTE TUBULAR COTT 4X25 (GAUZE/BANDAGES/DRESSINGS) IMPLANT
STOCKINETTE TUBULAR COTTON 2IN (GAUZE/BANDAGES/DRESSINGS) IMPLANT
STRIP CLOSURE SKIN 1/2X4 (GAUZE/BANDAGES/DRESSINGS) ×2 IMPLANT
SUT PROLENE 4 0 PS 2 18 (SUTURE) ×2 IMPLANT
SYR BULB 3OZ (MISCELLANEOUS) ×2 IMPLANT
SYR CONTROL 10ML LL (SYRINGE) ×3 IMPLANT
TOWEL OR 17X24 6PK STRL BLUE (TOWEL DISPOSABLE) ×2 IMPLANT
TOWEL OR NON WOVEN STRL DISP B (DISPOSABLE) ×2 IMPLANT
TRAY DSU PREP LF (CUSTOM PROCEDURE TRAY) ×2 IMPLANT
UNDERPAD 30X30 INCONTINENT (UNDERPADS AND DIAPERS) ×2 IMPLANT
WATER STERILE IRR 1000ML POUR (IV SOLUTION) ×2 IMPLANT

## 2011-08-19 NOTE — Anesthesia Postprocedure Evaluation (Signed)
Anesthesia Post Note  Patient: Stacey Lawrence  Procedure(s) Performed:  RELEASE TRIGGER FINGER/A-1 PULLEY - right middle finger a-1 pulley release with tenosynovectomy  Anesthesia type: MAC  Patient location: PACU  Post pain: Pain level controlled  Post assessment: Patient's Cardiovascular Status Stable  Last Vitals:  Filed Vitals:   08/19/11 0854  BP:   Pulse: 67  Temp:   Resp: 14    Post vital signs: Reviewed and stable  Level of consciousness: sedated  Complications: No apparent anesthesia complications

## 2011-08-19 NOTE — Anesthesia Procedure Notes (Addendum)
Procedure Name: MAC Date/Time: 08/19/2011 8:01 AM Performed by: Radford Pax Pre-anesthesia Checklist: Patient identified, Emergency Drugs available, Suction available, Patient being monitored and Timeout performed Patient Re-evaluated:Patient Re-evaluated prior to inductionOxygen Delivery Method: Simple face mask Intubation Type: IV induction

## 2011-08-19 NOTE — Anesthesia Preprocedure Evaluation (Signed)
Anesthesia Evaluation  Patient identified by MRN, date of birth, ID band Patient awake    Reviewed: Allergy & Precautions, H&P , NPO status , Patient's Chart, lab work & pertinent test results  History of Anesthesia Complications (+) PONV and Family history of anesthesia reaction  Airway       Dental   Pulmonary          Cardiovascular     Neuro/Psych  Headaches,    GI/Hepatic GERD-  Controlled,  Endo/Other    Renal/GU      Musculoskeletal  (+) Fibromyalgia -  Abdominal   Peds  Hematology   Anesthesia Other Findings   Reproductive/Obstetrics                           Anesthesia Physical Anesthesia Plan  ASA: II  Anesthesia Plan: Bier Block and MAC   Post-op Pain Management:    Induction:   Airway Management Planned:   Additional Equipment:   Intra-op Plan:   Post-operative Plan:   Informed Consent: I have reviewed the patients History and Physical, chart, labs and discussed the procedure including the risks, benefits and alternatives for the proposed anesthesia with the patient or authorized representative who has indicated his/her understanding and acceptance.     Plan Discussed with: Anesthesiologist and Surgeon  Anesthesia Plan Comments:         Anesthesia Quick Evaluation

## 2011-08-19 NOTE — H&P (Signed)
Stacey Lawrence is an 60 y.o. female.   Chief Complaint: Right middle finger pain  Presents for surgeryHPI: Pleasant female who is here for right middle finger reconstruction.   Past Medical History  Diagnosis Date  . PONV (postoperative nausea and vomiting)   . Headache     migraines and cluster  . Fibromyalgia   . GERD (gastroesophageal reflux disease)     OTC as needed  . Kidney stones     Past Surgical History  Procedure Date  . Abdominal hysterectomy     part. hyst.  . Tubal ligation   . Cholecystectomy 10/28/2002    lap.  . Tonsillectomy and adenoidectomy   . Debridement tennis elbow   . Elbow surgery     golfer's elbow  . Knee debridement     left  . Appendectomy     with explor. lap.  . Stapedectomy     right    History reviewed. No pertinent family history. Social History:  reports that she has never smoked. She has never used smokeless tobacco. She reports that she drinks alcohol. She reports that she does not use illicit drugs.  Allergies:  Allergies  Allergen Reactions  . Dilaudid (Hydromorphone Hcl) Other (See Comments)    Resp. arrest  . Codeine Nausea And Vomiting and Rash    Medications Prior to Admission  Medication Dose Route Frequency Provider Last Rate Last Dose  . lactated ringers infusion   Intravenous Continuous Constance Goltz, MD 20 mL/hr at 08/19/11 [phone]    . DISCONTD: HYDROmorphone (DILAUDID) injection 0.25-0.5 mg  0.25-0.5 mg Intravenous Q5 min PRN Remonia Richter, MD       Medications Prior to Admission  Medication Sig Dispense Refill  . guaiFENesin 200 MG tablet Take 400 mg by mouth as needed.          No results found for this or any previous visit (from the past 48 hour(s)). No results found.  ROS  Blood pressure 114/68, pulse 69, temperature 98.5 F (36.9 C), temperature source Oral, resp. rate 18, height 5\' 8"  (1.727 m), weight 74.844 kg (165 lb), SpO2 96.00%. Physical Exam Right middle finger A1 pulley  tenosynovitis with locking and catching heent wnl Lungs CTA ABD Nl BLE WNL  Assessment/Plan Right middle finger A 1 pulley release and tenosynovectomy Will proceed accordingly  Ruwayda Curet III,Ragnar Waas M 08/19/2011, 7:37 AM

## 2011-08-19 NOTE — Brief Op Note (Signed)
08/19/2011  8:35 AM  PATIENT:  Stacey Lawrence  60 y.o. female  PRE-OPERATIVE DIAGNOSIS:  A-1 pulley index finger right hand  POST-OPERATIVE DIAGNOSIS:  A-1 pulley index finger right hand  PROCEDURE:  Procedure(s): RELEASE TRIGGER FINGER/A-1 PULLEY  SURGEON:  Surgeon(s): Oletta Cohn III  PHYSICIAN ASSISTANT:   ASSISTANTS: none   ANESTHESIA:   IV sedation  EBL:  Total I/O In: 600 [I.V.:600] Out: -   BLOOD ADMINISTERED:none  DRAINS: none   LOCAL MEDICATIONS USED:  MARCAINE 5 CC and LIDOCAINE 5 CC  SPECIMEN:  No Specimen  DISPOSITION OF SPECIMEN:  N/A  COUNTS:  YES  TOURNIQUET:   Total Tourniquet Time Documented: Upper Arm (Right) - 4 minutes  DICTATION: .Other Dictation: Dictation Number [id number]  PLAN OF CARE: Discharge to home after PACU  PATIENT DISPOSITION:  PACU - hemodynamically stable.   Delay start of Pharmacological VTE agent (>24hrs) due to surgical blood loss or risk of bleeding:  {YES/NO/NOT APPLICABLE:20182

## 2011-08-19 NOTE — Progress Notes (Signed)
Called in Rx for Pt per their request.  Spoke with Control and instrumentation engineer at [facility].  Vicodin 5/500 2 tabs q 4 hours as needed for pain. 45 dispensed no refills Ultram 50 mg tab, 1-2 q6hrs as needed for lesser pain.  No refills, 45 dispensed.

## 2011-08-19 NOTE — Transfer of Care (Signed)
Immediate Anesthesia Transfer of Care Note  Patient: Stacey Lawrence  Procedure(s) Performed:  RELEASE TRIGGER FINGER/A-1 PULLEY - right middle finger a-1 pulley release with tenosynovectomy  Patient Location: PACU  Anesthesia Type: MAC  Level of Consciousness: awake, alert , oriented and patient cooperative  Airway & Oxygen Therapy: Patient Spontanous Breathing  Post-op Assessment: Report given to PACU RN and Post -op Vital signs reviewed and stable  Post vital signs: Reviewed and stable  Complications: No apparent anesthesia complications

## 2011-08-22 NOTE — Op Note (Signed)
Stacey Lawrence, Stacey Lawrence NO.:  [id number]  MEDICAL RECORD NO.:  [id number]  LOCATION:                                 FACILITY:  PHYSICIAN:  Dionne Ano. Vaniah Chambers, M.D.DATE OF BIRTH:  03-03-1951  DATE OF PROCEDURE: DATE OF DISCHARGE:                              OPERATIVE REPORT   PREOPERATIVE DIAGNOSIS:  Right middle finger chronic A1 pulley tenosynovitis.  POSTOP DIAGNOSIS:  Right middle finger chronic A1 pulley tenosynovitis.  PROCEDURE: 1. A1 pulley release and extensor tenosynovectomy, flexor digitorum     profundus and flexor digitorum superficialis tendons, right middle     finger. 2. Manipulation under anesthesia, right middle finger PIP and DIP     joints.  SURGEON:  Dionne Ano. Amanda Pea, M.D.  ASSISTANTS:  None.  COMPLICATIONS:  None.  ANESTHESIA:  Local with IV sedation.  TOURNIQUET TIME:  Less than 10 minutes.  INDICATIONS FOR THE PROCEDURE:  A 60 year old female who presents with above-mentioned diagnosis.  I have counseled in regard to risks and benefits of the surgery.  She desires to proceed with the above- mentioned operative intervention.  All questions have been encouraged and answered preoperatively.  OPERATIVE PROCEDURE:  The patient was seen by myself and Anesthesia, taken to the operative suite and underwent smooth induction of intermetacarpal/field block.  Under IV sedation, she was prepped and draped in a usual sterile fashion.  Arm was marked, time-out was called. All questions were encouraged and answered preoperatively.  Once under a sterile field after prepped and draped, a modified Bruner incision was made.  Dissection was carried down.  A1 pulley was released, and following this, tenosynovectomy of the flexor digitorum profundus, followed by flexor digitorum superficialis tendons were accomplished about the right middle finger.  Once this was complete, I then performed manipulation under anesthesia of the PIP and DIP joints  eliminating the chronic flexion contracture.  She tolerated this well.  Tourniquet was deflated.  Hemostasis secured.  The wound closed after hemostasis was secured with Prolene suture.  She was dressed sterilely, taken to recovery room.  All sponge, needle, and instrument counts were correct. She was given Ancef preoperatively.  Postoperatively, she will be discharged home on Vicodin for severe pain, tramadol for less severe pain.  Notify me should any problems occur.  These notes have been discussed and all questions have been encouraged and answered.  We will see her back in the office in 7 days, therapy in 12 days.     Dionne Ano. Amanda Pea, M.D.     [facility]  D:  08/19/2011  T:  08/19/2011  Job:  [id number]

## 2011-08-23 ENCOUNTER — Encounter (HOSPITAL_BASED_OUTPATIENT_CLINIC_OR_DEPARTMENT_OTHER): Payer: Self-pay | Admitting: Orthopedic Surgery

## 2011-09-30 ENCOUNTER — Ambulatory Visit (HOSPITAL_COMMUNITY)
Admission: RE | Admit: 2011-09-30 | Discharge: 2011-09-30 | Disposition: A | Discharge status: Home / Self Care | Payer: 59 | Source: Ambulatory Visit | Attending: Orthopedic Surgery | Admitting: Orthopedic Surgery

## 2011-09-30 DIAGNOSIS — IMO0001 Reserved for inherently not codable concepts without codable children: Secondary | ICD-10-CM | POA: Insufficient documentation

## 2011-09-30 DIAGNOSIS — M25449 Effusion, unspecified hand: Secondary | ICD-10-CM | POA: Insufficient documentation

## 2011-09-30 DIAGNOSIS — M25549 Pain in joints of unspecified hand: Secondary | ICD-10-CM | POA: Insufficient documentation

## 2011-09-30 DIAGNOSIS — M25649 Stiffness of unspecified hand, not elsewhere classified: Secondary | ICD-10-CM | POA: Insufficient documentation

## 2011-09-30 NOTE — Patient Instructions (Addendum)
Patient instructed to get a paraffin machine for self heating before movement Instructed in use of Kinesio to wear 3 days and shower with it on and gentle AROM of hand.

## 2011-09-30 NOTE — Progress Notes (Signed)
Occupational Therapy Evaluation  Patient Details  Name: Stacey Lawrence MRN: [id number] Date of Birth: 1951-03-14  Today's Date: 09/30/2011 Time: 2952-8413 OT Evaluation 2440-1027 20' Manual Therapy Scar Massage, edema control 1125-1135 10' Therapeutic Exercise 1135-1145 10' Time Calculation (min): 40 min  Visit#: 1  of 4   Re-eval: 10/06/11  Assessment Diagnosis:  (s/p Right Middle Finger A1 pully Release) Surgical Date:  (08/19/11) Next MD Visit:  (10/06/10) Prior Therapy:  (Home Exercises only)  Past Medical History:  Past Medical History  Diagnosis Date  . PONV (postoperative nausea and vomiting)   . Headache     migraines and cluster  . Fibromyalgia   . GERD (gastroesophageal reflux disease)     OTC as needed  . Kidney stones    Past Surgical History:  Past Surgical History  Procedure Date  . Abdominal hysterectomy     part. hyst.  . Tubal ligation   . Cholecystectomy 10/28/2002    lap.  . Tonsillectomy and adenoidectomy   . Debridement tennis elbow   . Elbow surgery     golfer's elbow  . Knee debridement     left  . Appendectomy     with explor. lap.  . Stapedectomy     right  . Trigger finger release 08/19/2011    Procedure: RELEASE TRIGGER FINGER/A-1 PULLEY;  Surgeon: Oletta Cohn III;  Location: [facility];  Service: Orthopedics;  Laterality: Right;  right middle finger a-1 pulley release with tenosynovectomy    Subjective Symptoms/Limitations Symptoms: S: "My finger is swollen, painful and can not close my hand (stiff). Pain Assessment Currently in Pain?: Yes Pain Score:   8 Pain Location: Finger (Comment which one) (right middle) Pain Type: Chronic pain;Surgical pain;Acute pain Pain Onset: More than a month ago Pain Frequency: Intermittent Pain Relieving Factors: rest Effect of Pain on Daily Activities: movement, use and stretch increase pain.  Prior Functioning Independent with all ADL's; IADL's and Driving      Assessment ADL/Vision/Perception Vision - History Baseline Vision: Wears contacts Vision - Assessment Eye Alignment: Within Functional Limits Perception Perception: Within Functional Limits Praxis Praxis: Intact  Cognition/Observation Cognition Overall Cognitive Status: Appears within functional limits for tasks assessed Arousal/Alertness: Awake/alert Orientation Level: Oriented X4 Observation/Other Assessments Observations:  (Swelling 1 cm base, .5 cm middle , and .25 at tip of R MF)  Sensation/Coordination/Edema Sensation Light Touch: Appears Intact Stereognosis: Appears Intact Hot/Cold: Appears Intact Proprioception: Appears Intact Coordination Gross Motor Movements are Fluid and Coordinated: Yes Fine Motor Movements are Fluid and Coordinated: No Edema Edema: Slight edema through RMF 1-.25 cm   Additional Assessments RUE Assessment RUE Assessment: Exceptions to [facility] RUE AROM (degrees) Right Composite Finger Extension: 25% (R MF DIP Flex 40 ; PIP Flex 90) Right Composite Finger Flexion: 25% LUE Assessment LUE Assessment: Within Functional Limits  Exercise/Treatments Hand Exercises MCPJ Flexion: AROM;AAROM;15 reps;Self ROM MCPJ Extension: AROM;AAROM;15 reps;Strengthening PIPJ Flexion: AROM;AAROM;Self ROM;15 reps PIPJ Extension: AROM;AAROM;Self ROM;15 reps DIPJ Flexion: AROM;AAROM;Self ROM;15 reps DIPJ Extension: AROM;AAROM;Self ROM;15 reps     Manual Therapy Manual Therapy: Edema management Edema Management: scar and edema massage; placement of Kinesio tape to R MF Massage: Light Joint ROM  Occupational Therapy Assessment and Plan OT Assessment and Plan Clinical Impression Statement: A: Patient has increased swelling. scar adhesions, pain on movement and decreased A/PROM R MF s/p A1 realease Rehab Potential: Excellent Clinical Impairments Affecting Rehab Potential: Pateint unable to use right hand fully and has decreased ability to perform ADL's and all  work with pain OT Frequency: Min 2X/week OT Duration: 2 weeks OT Treatment/Interventions: Therapeutic exercise;Manual therapy;Self-care/ADL training;Therapeutic activities;Patient/family education OT Plan: P: OT to see 2x a week x 2 weeks to desensitize, increase A/PROM, decrease edema and pain and return to prior  function   Goals Home Exercise Program Pt will Perform Home Exercise Program: Independently Short Term Goals Time to Complete Short Term Goals: 2 weeks Short Term Goal 1: Resolved edema R MF Short Term Goal 2: Decrease pain with movement 1-1/10 Short Term Goal 3: Resolved sensitivity over R MF and palm Short Term Goal 4: Return of full functioanal mobilit with full composite finger flexion Short Term Goal 5: Softened and lengthened scar tissue over incision site Right palm Additional Short Term Goals?: Yes Short Term Goal 6: Return to full functional use during ADL's and normal job activities  Problem List There is no problem list on file for this patient.   End of Session Equipment Utilized During Treatment: Paraffin; kinesio tape Activity Tolerance: Patient tolerated treatment well General Behavior During Session: [facility] for tasks performed Cognition: [facility] for tasks performed OT Plan of Care OT Home Exercise Plan: Heat with paraffin, gentle AROM and edema control and desensitization and pain control  with keniso tape Consulted and Agree with Plan of Care: Patient   Lisa Roca OTR/L 09/30/2011, 12:17 PM  Physician Documentation Your signature is required to indicate approval of the treatment plan as stated above.  Please sign and either send electronically or make a copy of this report for your files and return this physician signed original.  Please mark one 1.__approve of plan  2. ___approve of plan with the following conditions.   ______________________________                                                          _____________________ Physician Signature                                                                                                              Date

## 2011-10-05 ENCOUNTER — Ambulatory Visit (HOSPITAL_COMMUNITY): Payer: 59 | Admitting: Occupational Therapy

## 2011-10-06 ENCOUNTER — Ambulatory Visit (HOSPITAL_COMMUNITY)
Admission: RE | Admit: 2011-10-06 | Discharge: 2011-10-06 | Disposition: A | Discharge status: Home / Self Care | Payer: 59 | Source: Ambulatory Visit | Attending: Internal Medicine | Admitting: Internal Medicine

## 2011-10-06 NOTE — Progress Notes (Signed)
Occupational Therapy Treatment  Patient Details  Name: Stacey Lawrence MRN: [id number] Date of Birth: 10-28-1950  Today's Date: 10/06/2011 Time: 6295-2841 Time Calculation (min): 45 min  Visit#: 2  of 4   Re-eval: 10/13/11 [facility] Therapy [phone] 02' Therapeutic Exercise [phone] 19'     Subjective Symptoms/Limitations Symptoms: S:It locked two nights ago, it hurt very bad 10/10. Pain Assessment Currently in Pain?: No/denies  Precautions/Restrictions     Exercise/Treatments Hand Exercises MCPJ Flexion: AROM;10 reps MCPJ Extension: AROM;10 reps PIPJ Flexion: AROM;10 reps PIPJ Extension: AROM;10 reps DIPJ Flexion: AROM;10 reps DIPJ Extension: AROM;10 reps Theraputty - Flatten: yellow Theraputty - Roll: yellow Theraputty - Grip: yellow Theraputty - Pinch: yellow Theraputty - Locate Pegs: yellow x 12 Sponges: 20,21     Modalities Modalities: Moist Heat Manual Therapy Manual Therapy: Myofascial release Myofascial Release: MFR and manual stretching to increase ROM, decrease restrictions to increase strength and decrease pain. Moist Heat Therapy Number Minutes Moist Heat: 1 Minutes Moist Heat Location: Hand;Other (comment) (with paraffin)  Occupational Therapy Assessment and Plan OT Assessment and Plan Clinical Impression Statement: A:  Added sponges and yellow theraputty ex. Rehab Potential: Excellent OT Plan: P: Con't to continue to increase prom and decrease restrictions.   Goals Home Exercise Program Pt will Perform Home Exercise Program: Independently Short Term Goals Time to Complete Short Term Goals: 2 weeks Short Term Goal 1: Resolved edema R MF Short Term Goal 2: Decrease pain with movement 1-1/10 Short Term Goal 3: Resolved sensitivity over R MF and palm Short Term Goal 4: Return of full functioanal mobilit with full composite finger flexion Short Term Goal 5: Softened and lengthened scar tissue over incision site Right palm Additional Short Term  Goals?: Yes Short Term Goal 6: Return to full functional use during ADL's and normal job activities  Problem List There is no problem list on file for this patient.   End of Session Activity Tolerance: Patient tolerated treatment well General Behavior During Session: [facility] for tasks performed Cognition: [facility] for tasks performed   Sriansh Farra L. Delisha Peaden, COTA/L  10/06/2011, 4:42 PM

## 2011-10-10 ENCOUNTER — Ambulatory Visit (HOSPITAL_COMMUNITY)
Admission: RE | Admit: 2011-10-10 | Discharge: 2011-10-10 | Disposition: A | Discharge status: Home / Self Care | Payer: 59 | Source: Ambulatory Visit | Attending: Internal Medicine | Admitting: Internal Medicine

## 2011-10-10 NOTE — Progress Notes (Signed)
Occupational Therapy Treatment  Patient Details  Name: Stacey Lawrence MRN: [id number] Date of Birth: 1951-09-15  Today's Date: 10/10/2011 Time: 4098-1191 Time Calculation (min): 60 min Paraffin 1525-1530 5' Manual Therapy 4782-9562  25' Splinting and Patient instruction [phone] 21' Visit#: 3  of 4   Re-eval: 10/13/11   Subjective Symptoms/Limitations Symptoms: S: Patient states that she hasn't had any locking since last visits but taht she still has pain and triggering only this time it is at the PIP joint. Patient notes that the scar has softened and lengthened. Repetition: Decreases Symptoms Pain Assessment Currently in Pain?: Yes Pain Score:   4 Pain Location: Finger (Comment which one) Pain Orientation: Right (middle) Pain Type: Chronic pain;Surgical pain;Acute pain Pain Onset: More than a month ago Pain Frequency: Intermittent Pain Relieving Factors: Rest and decreased full extension of digit  Precautions/Restrictions     Exercise/Treatments    Modalities Modalities:  (paraffin to hand) Manual Therapy Manual Therapy: Edema management Edema Management: edema and scar massage. Massage: gentle Active joint ROM Splinting Splinting: fabrication of a ring splint to keep her from fully extending her hand or another to wear to keep her from fully flexing. trigger happens with full ext @ MP. most  Occupational Therapy Assessment and Plan OT Assessment and Plan Clinical Impression Statement: A: Trained patient not to actkively trigger finger and attempted to fabricate a ring spint to assist this. Rehab Potential: Excellent Clinical Impairments Affecting Rehab Potential: Patient has decreased trigger with wearing of ring splint and may need silver ring anti trigger splint needs re-eval at next visit. OT Frequency: Min 2X/week OT Duration: 2 weeks OT Treatment/Interventions: Self-care/ADL training;Therapeutic exercise;Therapeutic activities;Splinting;Manual  therapy;Patient/family education OT Plan: P: increase Active use without triggering.   Goals Home Exercise Program Pt will Perform Home Exercise Program: Independently Short Term Goals Time to Complete Short Term Goals: 2 weeks Short Term Goal 1: Resolved edema R MF Short Term Goal 1 Progress: Progressing toward goal Short Term Goal 2: Decrease pain with movement 1-1/10 Short Term Goal 2 Progress: Progressing toward goal Short Term Goal 3: Resolved sensitivity over R MF and palm Short Term Goal 3 Progress: Met Short Term Goal 4: Return of full functioanal mobilit with full composite finger flexion Short Term Goal 4 Progress: Progressing toward goal Short Term Goal 5: Softened and lengthened scar tissue over incision site Right palm Short Term Goal 5 Progress: Met Additional Short Term Goals?: Yes Short Term Goal 6: Return to full functional use during ADL's and normal job activities  Problem List There is no problem list on file for this patient.   End of Session Activity Tolerance: Patient tolerated treatment well General Behavior During Session: [facility] for tasks performed Cognition: [facility] for tasks performed OT Plan of Care OT Home Exercise Plan: Patient trained in wear, care, donn and doff and function of ring splints. demonstrates understanding. Consulted and Agree with Plan of Care: Patient   Lisa Roca OTR/L 10/10/2011, 4:37 PM

## 2011-10-12 ENCOUNTER — Ambulatory Visit (HOSPITAL_COMMUNITY)
Admission: RE | Admit: 2011-10-12 | Discharge: 2011-10-12 | Disposition: A | Discharge status: Home / Self Care | Payer: 59 | Source: Ambulatory Visit | Attending: Orthopedic Surgery | Admitting: Orthopedic Surgery

## 2011-10-12 NOTE — Progress Notes (Signed)
Occupational Therapy Treatment  Patient Details  Name: Stacey Lawrence MRN: [id number] Date of Birth: [birth year]  Today's Date: 10/12/2011 Time: 7829-5621 Paraffin Treatment 1350-1400 10' Manual Therapy 1400-1420 20' Therapeutic Exercise 1420-1435 15' Time Calculation (min): 45 min  Visit#: 4  of 4   Re-eval: 10/12/11   Subjective Symptoms/Limitations Symptoms: S: Pateint has been wearing anti-locking splint and keeping finger from locking. Repetition: Decreases Symptoms Pain Assessment Currently in Pain?: Yes Pain Score:   3 Pain Location: Hand Pain Orientation: Right Pain Type: Chronic pain Pain Onset: More than a month ago Pain Frequency: Intermittent  Precautions/Restrictions No Triggering    Exercise/Treatments Hand Exercises Patient given blocking exercises, AAROM and PROM to digits and was able to bring finger to palm by end of treatment         Occupational Therapy Assessment and Plan OT Assessment and Plan Clinical Impression Statement: A: Pateint no longer actively triggering.   Met Goals for decreased swelling ,decreased pain, increased AROM DIP 50, PIP104 and MP 80 Rehab Potential: Excellent Clinical Impairments Affecting Rehab Potential: Pateint unable to use right hand fully and has decreased ability to perform ADL's and all work with pain OT Frequency: Min 2X/week OT Duration: 2 weeks OT Treatment/Interventions: Therapeutic exercise;Manual therapy;Self-care/ADL training;Therapeutic activities;Patient/family education OT Plan: P: OT to see 2x a week x 2 weeks to desensitize, increase A/PROM, decrease edema and pain and return to prior  function      Problem List There is no problem list on file for this patient.   End of Session Equipment Utilized During Treatment: Paraffin; kinesio tape Activity Tolerance: Patient tolerated treatment well General Behavior During Session: [facility] for tasks performed Cognition: [facility] for tasks performed OT Plan of  Care OT Home Exercise Plan: Heat with paraffin, gentle AROM and edema control and desensitization and pain control  with keniso tape Consulted and Agree with Plan of Care: Patient   Lisa Roca OTR/L 10/12/2011, 6:38 PM

## 2011-10-17 ENCOUNTER — Ambulatory Visit (HOSPITAL_COMMUNITY)
Admission: RE | Admit: 2011-10-17 | Discharge: 2011-10-17 | Disposition: A | Discharge status: Home / Self Care | Payer: 59 | Source: Ambulatory Visit | Attending: Internal Medicine | Admitting: Internal Medicine

## 2011-10-17 NOTE — Progress Notes (Signed)
Occupational Therapy Treatment  Patient Details  Name: SHARNITA BOGUCKI MRN: [id number] Date of Birth: 1950-11-21  Today's Date: 10/17/2011 Time: 1478-2956 Time Calculation (min): 24 min  Visit#: 5  of 4   Re-eval: 10/12/11    Subjective Symptoms/Limitations Symptoms: S;  The doctor said it is his last day.  I have a cyst in my finger. Pain Assessment Currently in Pain?: No/denies  Precautions/Restrictions     Exercise/Treatments Hand Exercises       Modalities Modalities: Moist Heat Moist Heat Therapy Number Minutes Moist Heat: 10 Minutes (with paraffin bath) Moist Heat Location: Hand  Occupational Therapy Assessment and Plan OT Assessment and Plan Clinical Impression Statement: A: Patient independent with HEP.  MD going to aspirate cyst whe it gets large enough. Rehab Potential: Excellent OT Plan: P: D/C   Goals Home Exercise Program Pt will Perform Home Exercise Program: Independently Short Term Goals Time to Complete Short Term Goals: 2 weeks Short Term Goal 1: Resolved edema R MF Short Term Goal 1 Progress: Met Short Term Goal 2: Decrease pain with movement 1-1/10 Short Term Goal 2 Progress: Met Short Term Goal 3: Resolved sensitivity over R MF and palm Short Term Goal 3 Progress: Met Short Term Goal 4: Return of full functioanal mobilit with full composite finger flexion Short Term Goal 4 Progress: Met Short Term Goal 5: Softened and lengthened scar tissue over incision site Right palm Short Term Goal 5 Progress: Met Additional Short Term Goals?: Yes Short Term Goal 6: Return to full functional use during ADL's and normal job activities Short Term Goal 6 Progress: Met  Problem List There is no problem list on file for this patient.   End of Session Activity Tolerance: Patient tolerated treatment well General Behavior During Session: [facility] for tasks performed Cognition: [facility] for tasks performed  Noa Constante L. Kamilo Och, COTA/L  10/17/2011, 6:09 PM  No  charge for this visit.  Patient administered her own paraffin bath and is independent with HEP.

## 2011-10-19 ENCOUNTER — Ambulatory Visit (HOSPITAL_COMMUNITY): Payer: 59 | Admitting: Occupational Therapy

## 2011-10-24 ENCOUNTER — Ambulatory Visit (HOSPITAL_COMMUNITY): Payer: 59 | Admitting: Occupational Therapy

## 2011-10-26 ENCOUNTER — Ambulatory Visit (HOSPITAL_COMMUNITY): Payer: 59 | Admitting: Occupational Therapy

## 2011-10-31 ENCOUNTER — Ambulatory Visit (HOSPITAL_COMMUNITY): Payer: 59 | Admitting: Occupational Therapy

## 2011-11-02 ENCOUNTER — Ambulatory Visit (HOSPITAL_COMMUNITY): Payer: 59 | Admitting: Occupational Therapy

## 2012-02-11 ENCOUNTER — Encounter (HOSPITAL_COMMUNITY): Payer: Self-pay | Admitting: *Deleted

## 2012-02-11 ENCOUNTER — Emergency Department (HOSPITAL_COMMUNITY)
Admission: EM | Admit: 2012-02-11 | Discharge: 2012-02-11 | Disposition: A | Discharge status: Home / Self Care | Payer: 59 | Attending: Emergency Medicine | Admitting: Emergency Medicine

## 2012-02-11 DIAGNOSIS — W268XXA Contact with other sharp object(s), not elsewhere classified, initial encounter: Secondary | ICD-10-CM | POA: Insufficient documentation

## 2012-02-11 DIAGNOSIS — S61219A Laceration without foreign body of unspecified finger without damage to nail, initial encounter: Secondary | ICD-10-CM

## 2012-02-11 DIAGNOSIS — S61209A Unspecified open wound of unspecified finger without damage to nail, initial encounter: Secondary | ICD-10-CM | POA: Insufficient documentation

## 2012-02-11 MED ORDER — TRAMADOL HCL 50 MG PO TABS: 50.0000 mg | ORAL_TABLET | Freq: Four times a day (QID) | ORAL | Status: AC | PRN

## 2012-02-11 MED ORDER — LIDOCAINE HCL (PF) 1 % IJ SOLN
INTRAMUSCULAR | Status: AC
  Filled 2012-02-11: qty 5

## 2012-02-11 NOTE — Discharge Instructions (Signed)

## 2012-02-11 NOTE — ED Notes (Signed)
Lac to right thumb caused by a piece of glass while washing dishes

## 2012-02-11 NOTE — ED Notes (Signed)
Pt has laceration on her right thumb. Pt was washing dishes and a glass broke. Bleeding controlled at present.

## 2012-02-11 NOTE — ED Provider Notes (Signed)
History     CSN: [id number]  Arrival date & time 02/11/12  1147   First MD Initiated Contact with Patient 02/11/12 1237      Chief Complaint  Patient presents with  . Extremity Laceration    (Consider location/radiation/quality/duration/timing/severity/associated sxs/prior treatment) Patient is a 61 y.o. female presenting with skin laceration. The history is provided by the patient.  Laceration  The incident occurred 1 to 2 hours ago. Pain location: right thumb. The laceration is 3 cm in size. The laceration mechanism was a broken glass. The pain is mild. The pain has been constant since onset. She reports no foreign bodies present. Her tetanus status is UTD.    Past Medical History  Diagnosis Date  . PONV (postoperative nausea and vomiting)   . Headache     migraines and cluster  . Fibromyalgia   . GERD (gastroesophageal reflux disease)     OTC as needed  . Kidney stones     Past Surgical History  Procedure Date  . Abdominal hysterectomy     part. hyst.  . Tubal ligation   . Cholecystectomy 10/28/2002    lap.  . Tonsillectomy and adenoidectomy   . Debridement tennis elbow   . Elbow surgery     golfer's elbow  . Knee debridement     left  . Appendectomy     with explor. lap.  . Stapedectomy     right  . Trigger finger release 08/19/2011    Procedure: RELEASE TRIGGER FINGER/A-1 PULLEY;  Surgeon: Oletta Cohn III;  Location: [facility];  Service: Orthopedics;  Laterality: Right;  right middle finger a-1 pulley release with tenosynovectomy    History reviewed. No pertinent family history.  History  Substance Use Topics  . Smoking status: Never Smoker   . Smokeless tobacco: Never Used  . Alcohol Use: Yes     rare    OB History    Grav Para Term Preterm Abortions TAB SAB Ect Mult Living                  Review of Systems  Musculoskeletal: Negative for joint swelling and arthralgias.  Skin: Negative for color change.       Laceration  of the right thumb  Neurological: Negative for weakness and numbness.  All other systems reviewed and are negative.    Allergies  Dilaudid and Codeine  Home Medications   Current Outpatient Rx  Name Route Sig Dispense Refill  . DOXYCYCLINE HYCLATE 100 MG PO TABS Oral Take 100 mg by mouth 2 (two) times daily. Three week course for tick fever    . GUAIFENESIN 200 MG PO TABS Oral Take 400 mg by mouth as needed. For allergies    . IBUPROFEN 200 MG PO TABS Oral Take 400 mg by mouth every 6 (six) hours as needed. For pain/headache      BP 121/68  Pulse 76  Temp(Src) 98.1 F (36.7 C) (Oral)  Resp 16  Ht 5\' 8"  (1.727 m)  Wt 165 lb (74.844 kg)  BMI 25.09 kg/m2  SpO2 99%  Physical Exam  Nursing note and vitals reviewed. Constitutional: She is oriented to person, place, and time. She appears well-developed and well-nourished. No distress.  HENT:  Head: Normocephalic and atraumatic.  Cardiovascular: Normal rate, regular rhythm and normal heart sounds.   Pulmonary/Chest: Effort normal and breath sounds normal.  Musculoskeletal: She exhibits tenderness. She exhibits no edema.       Right hand: She exhibits  laceration. She exhibits normal range of motion, no bony tenderness, normal two-point discrimination, normal capillary refill, no deformity and no swelling. normal sensation noted. Normal strength noted.       Hands:      3 cm superficial laceration to the right thumb. Bleeding controlled  Radial pulse is brisk, sensation intact.  CR< 2 sec.  No bruising or deformity.  Patient has full ROM.  Neurological: She is alert and oriented to person, place, and time. She exhibits normal muscle tone. Coordination normal.  Skin: Skin is warm and dry.    ED Course  Procedures (including critical care time)       MDM    Wound(s) explored with adequate hemostasis through ROM, no apparent gross foreign body retained, no significant involvement of deep structures such as bone / joint /  tendon / or neurovascular involvement noted.  Baseline Strength and Sensation to affected extremity(ies) with normal light touch for Pt, distal NVI with CR< 2 secs and pulse(s) intact to affected extremity(ies).   LACERATION REPAIR Performed by: TRIPLETT,TAMMY L. Authorized by: Maxwell Caul Consent: Verbal consent obtained. Risks and benefits: risks, benefits and alternatives were discussed Consent given by: patient Patient identity confirmed: provided demographic data Prepped and Draped in normal sterile fashion Wound explored  Laceration Location:right thumb  Laceration Length: 3cm  No Foreign Bodies seen or palpated  Anesthesia: local infiltration  Local anesthetic: lidocaine 1% w/o epinephrine  Anesthetic total: 2 ml  Irrigation method: syringe Amount of cleaning: standard  Skin closure: 4-0 ethilon  Number of sutures: 6 Technique: simple interrupted Patient tolerance: Patient tolerated the procedure well with no immediate complications.      Patient / Family / Caregiver understand and agree with initial ED impression and plan with expectations set for ED visit. Pt stable in ED with no significant deterioration in condition. Pt feels improved after observation and/or treatment in ED.     Tammy L. Trisha Mangle, [place] 02/17/12 1359                  Medical screening examination/treatment/procedure(s) were performed by non-physician practitioner and as supervising physician I was immediately available for consultation/collaboration.   Benny Lennert, MD 02/17/12 [phone]

## 2012-02-23 ENCOUNTER — Other Ambulatory Visit: Payer: Self-pay | Admitting: Orthopedic Surgery

## 2012-02-24 ENCOUNTER — Encounter (HOSPITAL_COMMUNITY): Payer: Self-pay | Admitting: Respiratory Therapy

## 2012-02-24 ENCOUNTER — Inpatient Hospital Stay (HOSPITAL_COMMUNITY): Admission: RE | Admit: 2012-02-24 | Discharge: 2012-02-24 | Payer: 59 | Source: Ambulatory Visit

## 2012-02-24 ENCOUNTER — Encounter (HOSPITAL_COMMUNITY): Payer: Self-pay | Admitting: *Deleted

## 2012-02-24 LAB — CBC
HCT: 45.3 % (ref 36.0–46.0)
MCHC: 33.6 g/dL (ref 30.0–36.0)
MCV: 83.7 fL (ref 78.0–100.0)
Platelets: 213 10*3/uL (ref 150–400)
RDW: 14.1 % (ref 11.5–15.5)

## 2012-02-24 LAB — BASIC METABOLIC PANEL
BUN: 14 mg/dL (ref 6–23)
CO2: 26 mEq/L (ref 19–32)
Calcium: 10.3 mg/dL (ref 8.4–10.5)
Chloride: 101 mEq/L (ref 96–112)
Creatinine, Ser: 0.84 mg/dL (ref 0.50–1.10)

## 2012-02-24 MED ORDER — CEFAZOLIN SODIUM 1-5 GM-% IV SOLN
1.0000 g | INTRAVENOUS | Status: AC
  Administered 2012-02-25: 1 g via INTRAVENOUS
  Filled 2012-02-24: qty 50

## 2012-02-24 NOTE — Progress Notes (Signed)
Pt doesn't have a cardiologist  Denies ever having a stress test/echo/heart cath  Medical MD is Dr Dwana Melena  cxr . 46yr ago

## 2012-02-24 NOTE — Pre-Procedure Instructions (Signed)
20 Stacey Lawrence  02/24/2012   Your procedure is scheduled on:  Sat, June 8 @ 7:30 AM  Report to [facility] at 6:00 AM.  Call this number if you have problems the morning of surgery: [phone]   Remember:   Do not eat food:After Midnight.  May have clear liquids: up to 4 Hours before arrival.(until 2:00 am)  Clear liquids include soda, tea, black coffee, apple or grape juice, broth,water  Take these medicines the morning of surgery with A SIP OF WATER:    Do not wear jewelry, make-up or nail polish.  Do not wear lotions, powders, or perfumes.  Do not shave 48 hours prior to surgery.   Do not bring valuables to the hospital.  Contacts, dentures or bridgework may not be worn into surgery.  Leave suitcase in the car. After surgery it may be brought to your room.  For patients admitted to the hospital, checkout time is 11:00 AM the day of discharge.   Patients discharged the day of surgery will not be allowed to drive home.  Special Instructions: CHG Shower Use Special Wash: 1/2 bottle night before surgery and 1/2 bottle morning of surgery.

## 2012-02-25 ENCOUNTER — Encounter (HOSPITAL_COMMUNITY): Payer: Self-pay | Admitting: Critical Care Medicine

## 2012-02-25 ENCOUNTER — Ambulatory Visit (HOSPITAL_COMMUNITY)
Admission: RE | Admit: 2012-02-25 | Discharge: 2012-02-25 | Disposition: A | Discharge status: Home / Self Care | Payer: 59 | Source: Ambulatory Visit | Attending: Orthopedic Surgery | Admitting: Orthopedic Surgery

## 2012-02-25 ENCOUNTER — Encounter (HOSPITAL_COMMUNITY)
Admission: RE | Disposition: A | Discharge status: Home / Self Care | Payer: Self-pay | Source: Ambulatory Visit | Attending: Orthopedic Surgery

## 2012-02-25 ENCOUNTER — Encounter (HOSPITAL_COMMUNITY): Payer: Self-pay | Admitting: *Deleted

## 2012-02-25 ENCOUNTER — Ambulatory Visit (HOSPITAL_COMMUNITY): Payer: 59 | Admitting: Critical Care Medicine

## 2012-02-25 DIAGNOSIS — X58XXXA Exposure to other specified factors, initial encounter: Secondary | ICD-10-CM | POA: Insufficient documentation

## 2012-02-25 DIAGNOSIS — IMO0001 Reserved for inherently not codable concepts without codable children: Secondary | ICD-10-CM | POA: Insufficient documentation

## 2012-02-25 DIAGNOSIS — S5420XA Injury of radial nerve at forearm level, unspecified arm, initial encounter: Secondary | ICD-10-CM | POA: Insufficient documentation

## 2012-02-25 DIAGNOSIS — K219 Gastro-esophageal reflux disease without esophagitis: Secondary | ICD-10-CM | POA: Insufficient documentation

## 2012-02-25 HISTORY — DX: Irritable bowel syndrome, unspecified: K58.9

## 2012-02-25 HISTORY — DX: Pain in unspecified joint: M25.50

## 2012-02-25 HISTORY — DX: Personal history of colonic polyps: Z86.010

## 2012-02-25 HISTORY — PX: I & D EXTREMITY: SHX5045

## 2012-02-25 HISTORY — DX: Secondary polycythemia: D75.1

## 2012-02-25 HISTORY — DX: Scoliosis, unspecified: M41.9

## 2012-02-25 HISTORY — DX: Personal history of other medical treatment: Z92.89

## 2012-02-25 HISTORY — DX: Personal history of colon polyps, unspecified: Z86.0100

## 2012-02-25 HISTORY — DX: Effusion, unspecified joint: M25.40

## 2012-02-25 SURGERY — IRRIGATION AND DEBRIDEMENT EXTREMITY
Anesthesia: General | Site: Thumb | Laterality: Right | Wound class: Clean

## 2012-02-25 MED ORDER — EPHEDRINE SULFATE 50 MG/ML IJ SOLN
INTRAMUSCULAR | Status: DC | PRN
  Administered 2012-02-25 (×2): 5 mg via INTRAVENOUS

## 2012-02-25 MED ORDER — LIDOCAINE HCL (CARDIAC) 20 MG/ML IV SOLN
INTRAVENOUS | Status: DC | PRN
  Administered 2012-02-25: 100 mg via INTRAVENOUS

## 2012-02-25 MED ORDER — CHLORHEXIDINE GLUCONATE 4 % EX LIQD: 60.0000 mL | Freq: Once | CUTANEOUS | Status: DC

## 2012-02-25 MED ORDER — MUPIROCIN 2 % EX OINT
TOPICAL_OINTMENT | Freq: Two times a day (BID) | CUTANEOUS | Status: DC
  Filled 2012-02-25: qty 22

## 2012-02-25 MED ORDER — PROPOFOL 10 MG/ML IV EMUL
INTRAVENOUS | Status: DC | PRN
  Administered 2012-02-25: 200 mg via INTRAVENOUS

## 2012-02-25 MED ORDER — ONDANSETRON HCL 4 MG/2ML IJ SOLN
INTRAMUSCULAR | Status: DC | PRN
  Administered 2012-02-25 (×2): 4 mg via INTRAVENOUS

## 2012-02-25 MED ORDER — MUPIROCIN 2 % EX OINT
TOPICAL_OINTMENT | CUTANEOUS | Status: AC
  Filled 2012-02-25: qty 22

## 2012-02-25 MED ORDER — SODIUM CHLORIDE 0.45 % IV SOLN: INTRAVENOUS | Status: DC

## 2012-02-25 MED ORDER — PROMETHAZINE HCL 25 MG/ML IJ SOLN
12.5000 mg | Freq: Once | INTRAMUSCULAR | Status: AC
  Administered 2012-02-25: 12.5 mg via INTRAVENOUS
  Filled 2012-02-25: qty 1

## 2012-02-25 MED ORDER — DEXAMETHASONE SODIUM PHOSPHATE 4 MG/ML IJ SOLN
INTRAMUSCULAR | Status: DC | PRN
  Administered 2012-02-25: 4 mg via INTRAVENOUS

## 2012-02-25 MED ORDER — LACTATED RINGERS IV SOLN
INTRAVENOUS | Status: DC | PRN
  Administered 2012-02-25: 07:00:00 via INTRAVENOUS

## 2012-02-25 MED ORDER — DROPERIDOL 2.5 MG/ML IJ SOLN
0.6250 mg | INTRAMUSCULAR | Status: DC | PRN
  Administered 2012-02-25: 0.625 mg via INTRAVENOUS

## 2012-02-25 MED ORDER — FENTANYL CITRATE 0.05 MG/ML IJ SOLN: 25.0000 ug | INTRAMUSCULAR | Status: DC | PRN

## 2012-02-25 MED ORDER — SODIUM CHLORIDE 0.9 % IR SOLN
Status: DC | PRN
  Administered 2012-02-25: 1000 mL

## 2012-02-25 MED ORDER — SODIUM CHLORIDE 0.9 % IV SOLN
INTRAVENOUS | Status: DC
  Administered 2012-02-25: 11:00:00 via INTRAVENOUS

## 2012-02-25 MED ORDER — FENTANYL CITRATE 0.05 MG/ML IJ SOLN
INTRAMUSCULAR | Status: DC | PRN
  Administered 2012-02-25: 100 ug via INTRAVENOUS
  Administered 2012-02-25: 25 ug via INTRAVENOUS

## 2012-02-25 MED ORDER — DROPERIDOL 2.5 MG/ML IJ SOLN
INTRAMUSCULAR | Status: AC
  Filled 2012-02-25: qty 2

## 2012-02-25 MED ORDER — MIDAZOLAM HCL 5 MG/5ML IJ SOLN
INTRAMUSCULAR | Status: DC | PRN
  Administered 2012-02-25: 2 mg via INTRAVENOUS

## 2012-02-25 MED ORDER — BUPIVACAINE HCL (PF) 0.25 % IJ SOLN
INTRAMUSCULAR | Status: DC | PRN
  Administered 2012-02-25: 10 mL

## 2012-02-25 SURGICAL SUPPLY — 47 items
BANDAGE CONFORM 2  STR LF (GAUZE/BANDAGES/DRESSINGS) IMPLANT
BANDAGE ELASTIC 4 VELCRO ST LF (GAUZE/BANDAGES/DRESSINGS) ×2 IMPLANT
BANDAGE GAUZE 4  KLING STR (GAUZE/BANDAGES/DRESSINGS) ×1 IMPLANT
BANDAGE GAUZE ELAST BULKY 4 IN (GAUZE/BANDAGES/DRESSINGS) ×2 IMPLANT
CLOTH BEACON ORANGE TIMEOUT ST (SAFETY) ×2 IMPLANT
CORDS BIPOLAR (ELECTRODE) ×2 IMPLANT
CUFF TOURNIQUET SINGLE 18IN (TOURNIQUET CUFF) ×2 IMPLANT
CUFF TOURNIQUET SINGLE 24IN (TOURNIQUET CUFF) IMPLANT
CUFF TOURNIQUET SINGLE 34IN LL (TOURNIQUET CUFF) IMPLANT
CUFF TOURNIQUET SINGLE 44IN (TOURNIQUET CUFF) IMPLANT
DRSG ADAPTIC 3X8 NADH LF (GAUZE/BANDAGES/DRESSINGS) ×2 IMPLANT
DRSG EMULSION OIL 3X3 NADH (GAUZE/BANDAGES/DRESSINGS) ×1 IMPLANT
ELECT REM PT RETURN 9FT ADLT (ELECTROSURGICAL)
ELECTRODE REM PT RTRN 9FT ADLT (ELECTROSURGICAL) IMPLANT
GAUZE XEROFORM 1X8 LF (GAUZE/BANDAGES/DRESSINGS) ×2 IMPLANT
GLOVE BIOGEL M STRL SZ7.5 (GLOVE) ×2 IMPLANT
GLOVE SS BIOGEL STRL SZ 8 (GLOVE) ×1 IMPLANT
GLOVE SUPERSENSE BIOGEL SZ 8 (GLOVE) ×1
GOWN PREVENTION PLUS XLARGE (GOWN DISPOSABLE) ×2 IMPLANT
GOWN STRL NON-REIN LRG LVL3 (GOWN DISPOSABLE) ×6 IMPLANT
GOWN STRL REIN XL XLG (GOWN DISPOSABLE) ×4 IMPLANT
HANDPIECE INTERPULSE COAX TIP (DISPOSABLE)
KIT BASIN OR (CUSTOM PROCEDURE TRAY) ×2 IMPLANT
KIT ROOM TURNOVER OR (KITS) ×2 IMPLANT
MANIFOLD NEPTUNE II (INSTRUMENTS) ×2 IMPLANT
NDL HYPO 25GX1X1/2 BEV (NEEDLE) IMPLANT
NEEDLE HYPO 25GX1X1/2 BEV (NEEDLE) IMPLANT
NS IRRIG 1000ML POUR BTL (IV SOLUTION) ×2 IMPLANT
PACK ORTHO EXTREMITY (CUSTOM PROCEDURE TRAY) ×2 IMPLANT
PAD ARMBOARD 7.5X6 YLW CONV (MISCELLANEOUS) ×4 IMPLANT
PAD CAST 4YDX4 CTTN HI CHSV (CAST SUPPLIES) ×1 IMPLANT
PADDING CAST COTTON 4X4 STRL (CAST SUPPLIES) ×2
SET HNDPC FAN SPRY TIP SCT (DISPOSABLE) IMPLANT
SPLINT FIBERGLASS 3X35 (CAST SUPPLIES) ×1 IMPLANT
SPLINT FINGER (SOFTGOODS) ×1 IMPLANT
SPONGE GAUZE 4X4 12PLY (GAUZE/BANDAGES/DRESSINGS) ×2 IMPLANT
SPONGE LAP 18X18 X RAY DECT (DISPOSABLE) ×2 IMPLANT
SPONGE LAP 4X18 X RAY DECT (DISPOSABLE) ×2 IMPLANT
SUT ETHILON 8 0 BV130 4 (SUTURE) ×2 IMPLANT
SUT PROLENE 5 0 PS 2 (SUTURE) ×1 IMPLANT
SYR CONTROL 10ML LL (SYRINGE) IMPLANT
TOWEL OR 17X24 6PK STRL BLUE (TOWEL DISPOSABLE) ×2 IMPLANT
TOWEL OR 17X26 10 PK STRL BLUE (TOWEL DISPOSABLE) ×2 IMPLANT
TUBE ANAEROBIC SPECIMEN COL (MISCELLANEOUS) IMPLANT
TUBE CONNECTING 12X1/4 (SUCTIONS) ×2 IMPLANT
WATER STERILE IRR 1000ML POUR (IV SOLUTION) ×2 IMPLANT
YANKAUER SUCT BULB TIP NO VENT (SUCTIONS) ×2 IMPLANT

## 2012-02-25 NOTE — Preoperative (Signed)
Beta Blockers   Reason not to administer Beta Blockers:Not Applicable 

## 2012-02-25 NOTE — Progress Notes (Signed)
Patient reports that nausea is improved. Color is improved from being pale to pink. Patient reports no pain. Patient sitting on side of bed with husband at bedside. Patient drinking ginger ale.

## 2012-02-25 NOTE — Progress Notes (Signed)
Patient remains with nausea intermittently resting and sipping on ginger ale

## 2012-02-25 NOTE — H&P (Signed)
Stacey Lawrence is an 61 y.o. female.   Chief Complaint: right thumb laceration with nerve HPI: Marland KitchenMarland KitchenPatient presents for evaluation and treatment of the of their upper extremity predicament. The patient denies neck back chest or of abdominal pain. The patient notes that they have no lower extremity problems. The patient from primarily complains of the upper extremity pain noted.  Patient presents for right thumb repair-digital nerve  Past Medical History  Diagnosis Date  . PONV (postoperative nausea and vomiting)   . Fibromyalgia   . Headache     migraines and cluster;last migraine 3 mon ago  . Joint pain   . Joint swelling   . Scoliosis   . GERD (gastroesophageal reflux disease)     hasn't started taking any meds  . IBS (irritable bowel syndrome)     constipation  . Hx of colonic polyps   . Kidney stones 03/2010  . History of blood transfusion     74yrs ago  . Polycythemia     Past Surgical History  Procedure Date  . Abdominal hysterectomy     part. hyst.  . Tubal ligation   . Cholecystectomy 10/28/2002    lap.  . Debridement tennis elbow   . Elbow surgery     golfer's elbow-bilateral  . Knee debridement     right  . Appendectomy     with explor. lap.  . Stapedectomy     right  . Trigger finger release 08/19/2011    Procedure: RELEASE TRIGGER FINGER/A-1 PULLEY;  Surgeon: Oletta Cohn III;  Location: [facility];  Service: Orthopedics;  Laterality: Right;  right middle finger a-1 pulley release with tenosynovectomy  . Tonsillectomy and adenoidectomy   . Diagnostic laparoscopy   . Dilation and curettage of uterus   . Tympanoplasty   . Colonoscopy     No family history on file. Social History:  reports that she has never smoked. She has never used smokeless tobacco. She reports that she does not drink alcohol or use illicit drugs.  Allergies:  Allergies  Allergen Reactions  . Dilaudid (Hydromorphone Hcl) Other (See Comments)    Resp. arrest  .  Morphine And Related Nausea And Vomiting  . Codeine Nausea And Vomiting and Rash    Medications Prior to Admission  Medication Sig Dispense Refill  . doxycycline (VIBRA-TABS) 100 MG tablet Take 100 mg by mouth 2 (two) times daily. Three week course for tick fever        Results for orders placed during the hospital encounter of 02/24/12 (from the past 48 hour(s))  SURGICAL PCR SCREEN     Status: Normal   Collection Time   02/24/12 11:25 AM      Component Value Range Comment   MRSA, PCR NEGATIVE  NEGATIVE     Staphylococcus aureus NEGATIVE  NEGATIVE    BASIC METABOLIC PANEL     Status: Abnormal   Collection Time   02/24/12 12:00 PM      Component Value Range Comment   Sodium 138  135 - 145 (mEq/L)    Potassium 3.9  3.5 - 5.1 (mEq/L)    Chloride 101  96 - 112 (mEq/L)    CO2 26  19 - 32 (mEq/L)    Glucose, Bld 109 (*) 70 - 99 (mg/dL)    BUN 14  6 - 23 (mg/dL)    Creatinine, Ser 1.91  0.50 - 1.10 (mg/dL)    Calcium 47.8  8.4 - 10.5 (mg/dL)  GFR calc non Af Amer 75 (*) >90 (mL/min)    GFR calc Af Amer 86 (*) >90 (mL/min)   CBC     Status: Abnormal   Collection Time   02/24/12 12:00 PM      Component Value Range Comment   WBC 6.9  4.0 - 10.5 (K/uL)    RBC 5.41 (*) 3.87 - 5.11 (MIL/uL)    Hemoglobin 15.2 (*) 12.0 - 15.0 (g/dL)    HCT 11.9  14.7 - 82.9 (%)    MCV 83.7  78.0 - 100.0 (fL)    MCH 28.1  26.0 - 34.0 (pg)    MCHC 33.6  30.0 - 36.0 (g/dL)    RDW 56.2  13.0 - 86.5 (%)    Platelets 213  150 - 400 (K/uL)    No results found.  Review of Systems  HENT: Negative.   Eyes: Negative.   Cardiovascular: Negative.   Gastrointestinal: Negative.   Genitourinary: Negative.   Neurological: Negative.   Endo/Heme/Allergies: Negative.   Psychiatric/Behavioral: Negative.     Blood pressure 120/75, pulse 70, temperature 97.7 F (36.5 C), temperature source Oral, resp. rate 18, height 5\' 8"  (1.727 m), weight 73.936 kg (163 lb), SpO2 97.00%. Physical Exam ..The patient is alert and  oriented in no acute distress the patient complains of pain in the affected upper extremity. The patient is noted to have a normal HEENT exam. Lung fields show equal chest expansion and no shortness of breath abdomen exam is nontender without distention. Lower extremity examination does not show any fracture dislocation or blood clot symptoms. Pelvis is stable neck and back are stable and nontender  Right thumb SRN laceration  Assessment/Plan .Marland KitchenWe are planning surgery for your upper extremity. The risk and benefits of surgery include risk of bleeding infection anesthesia damage to normal structures and failure of the surgery to accomplish its intended goals of relieving symptoms and restoring function with this in mind we'll going to proceed. I have specifically discussed with the patient the pre-and postoperative regime and the does and don'ts and risk and benefits in great detail. Risk and benefits of surgery also include risk of dystrophy chronic nerve pain failure of the healing process to go onto completion and other inherent risks of surgery The relavent the pathophysiology of the disease/injury process, as well as the alternatives for treatment and postoperative course of action has been discussed in great detail with the patient who desires to proceed.  We will do everything in our power to help you (the patient) restore function to the upper extremity. Is a pleasure to see this patient today.   Karen Chafe 02/25/2012, 7:25 AM

## 2012-02-25 NOTE — Anesthesia Postprocedure Evaluation (Signed)
Anesthesia Post Note  Patient: Stacey Lawrence  Procedure(s) Performed: Procedure(s) (LRB): IRRIGATION AND DEBRIDEMENT EXTREMITY (Right)  Anesthesia type: general  Patient location: PACU  Post pain: Pain level controlled  Post assessment: Patient's Cardiovascular Status Stable  Last Vitals:  Filed Vitals:   02/25/12 0850  BP: 126/63  Pulse: 102  Temp: 36.1 C  Resp: 10    Post vital signs: Reviewed and stable  Level of consciousness: sedated  Complications: No apparent anesthesia complications

## 2012-02-25 NOTE — Transfer of Care (Signed)
Immediate Anesthesia Transfer of Care Note  Patient: Stacey Lawrence  Procedure(s) Performed: Procedure(s) (LRB): IRRIGATION AND DEBRIDEMENT EXTREMITY (Right)  Patient Location: PACU  Anesthesia Type: General  Level of Consciousness: awake, alert  and oriented  Airway & Oxygen Therapy: Patient Spontanous Breathing and Patient connected to nasal cannula oxygen  Post-op Assessment: Report given to PACU RN, Post -op Vital signs reviewed and stable and Patient moving all extremities X 4  Post vital signs: Reviewed and stable  Complications: No apparent anesthesia complications

## 2012-02-25 NOTE — Op Note (Signed)
See dictation #[id number] Dominica Severin MD

## 2012-02-25 NOTE — Discharge Instructions (Signed)
We recommend that you to take vitamin C 1000 mg a day to promote healing we also recommend that if you require her pain medicine that he take a stool softener to prevent constipation as most pain medicines will have constipation side effects. We recommend either Peri-Colace or Senokot and recommend that you also consider adding MiraLAX to prevent the constipation affects from pain medicine if you are required to use them. These medicines are over the counter and maybe purchased at a local pharmacy.  Keep bandage clean and dry.  Call for any problems.  No smoking.  Criteria for driving a car: you should be off your pain medicine for 7-8 hours, able to drive one handed(confident), thinking clearly and feeling able in your judgement to drive. Continue elevation as it will decrease swelling.  If instructed by MD move your fingers within the confines of the bandage/splint.  Use ice if instructed by your MD. Call immediately for any sudden loss of feeling in your hand/arm or change in functional abilities of the extremity.   Your surgery went great

## 2012-02-25 NOTE — Progress Notes (Signed)
Patient arrives from PACU with nausea patient given ginger ale. Will monitor

## 2012-02-25 NOTE — Op Note (Signed)
Stacey Lawrence, Stacey Lawrence NO.:  [id number]  MEDICAL RECORD NO.:  [id number]  LOCATION:  [facility]                         FACILITY:  [facility]  PHYSICIAN:  Dionne Ano. [name], M.D.DATE OF BIRTH:  July 28, 1951  DATE OF PROCEDURE: DATE OF DISCHARGE:                              OPERATIVE REPORT   PREOPERATIVE DIAGNOSIS:  Right thumb superficial radial nerve laceration.  POSTOPERATIVE DIAGNOSIS:  Right thumb superficial radial nerve laceration.  PROCEDURE:  Irrigation and debridement with exploration, right thumb dorsal wound.  The patient had some partial injury to the extensor apparatus but the EPL was intact.  She had a complete superficial radial nerve laceration.  SURGICAL PROCEDURE PERFORMED: 1. Irrigation and debridement of skin, subcutaneous tissue, tendon and     associated soft tissues about the dorsal aspect of the thumb with     exploration of the tendon and nerve apparatus. 2. Superficial radial nerve repair with epineurial microsurgical     technique.  SURGEON:  Dionne Ano. Amanda Pea, MD  ASSISTANT:  Karie Chimera, who was instrumental with retraction and handling of the nerve as an assistant.  TOURNIQUET TIME:  Less than an hour.  INDICATIONS FOR THE PROCEDURE:  This patient is a pleasant female, presents with above-mentioned diagnosis.  I have counseled in regards to risks and benefits of surgery.  She desires to proceed with the above mentioned operative intervention.  OPERATION DETAILS:  The patient was seen by myself and Anesthesia, taken to the operative suite, underwent smooth induction of general anesthesia, laid supine, appropriately padded, prepped and draped in usual sterile fashion with Betadine scrub and paint.  Following this, a 1 mm rim of skin on either side of the laceration was excised sharply with knife blade under 4.0 loupe magnification.  Dissection was carried down.  I very carefully explored the area.  The proximal and distal aspects  of the superficial radial nerve were completely lacerated. There was some partial fraying to the extensor apparatus but there was not an injury to the EPL. This was more of the extensor hood and it was stable.  I did not choose to perform any surgical debridement here.  Following this, the patient underwent copious irrigation and debridement of the wound.  This was an excisional debridement of course.  Following this, the patient then underwent coaptation of the superficial radial nerve.  I took straight serrated scissors and under loupe magnification, cut back until fascicles were stable.  Following this, the nerve was repaired with a 8-0 nylon interrupted microsurgical repair technique of the epineurium.  Three stitches were placed to coapt the nerve ends under no tension.  The patient tolerated this well.  Following repair of the nerve, I took pictures intraoperatively for the patient and closed the wound with tourniquet deflated and hemostasis secured.  The patient was placed in a thumb spica splint with the thumb in extension.  She will remain in this until first postop visit.  At first postop visit, we will have a therapy appointment so that we can make her a splint.  I want to keep the IP joint at 0 to slight hyperextension for 4 weeks so that we will have a tension-free repair of  the nerve at 4 weeks.  Begin active range of motion as tolerated.  It was a pleasure to see her.  This note was discussed and all questions have been encouraged and answered.     Dionne Ano. Amanda Pea, M.D.     [facility]  D:  02/25/2012  T:  02/25/2012  Job:  [id number]

## 2012-02-25 NOTE — Anesthesia Preprocedure Evaluation (Addendum)
Anesthesia Evaluation  Patient identified by MRN, date of birth, ID band Patient awake    History of Anesthesia Complications (+) PONV  Airway Mallampati: I  Neck ROM: Full    Dental  (+) Dental Advisory Given and Teeth Intact   Pulmonary    Pulmonary exam normal       Cardiovascular     Neuro/Psych    GI/Hepatic Neg liver ROS, GERD-  ,  Endo/Other  negative endocrine ROS  Renal/GU negative Renal ROS     Musculoskeletal  (+) Fibromyalgia -  Abdominal   Peds  Hematology negative hematology ROS (+) polycythemia   Anesthesia Other Findings   Reproductive/Obstetrics                         Anesthesia Physical Anesthesia Plan  ASA: II  Anesthesia Plan: General   Post-op Pain Management:    Induction: Intravenous  Airway Management Planned: LMA  Additional Equipment:   Intra-op Plan:   Post-operative Plan: Extubation in OR  Informed Consent: I have reviewed the patients History and Physical, chart, labs and discussed the procedure including the risks, benefits and alternatives for the proposed anesthesia with the patient or authorized representative who has indicated his/her understanding and acceptance.   Dental advisory given  Plan Discussed with: Anesthesiologist, Surgeon and CRNA  Anesthesia Plan Comments:        Anesthesia Quick Evaluation

## 2012-02-25 NOTE — Anesthesia Procedure Notes (Signed)
Procedure Name: LMA Insertion Date/Time: 02/25/2012 7:37 AM Performed by: Elon Alas Pre-anesthesia Checklist: Patient identified, Timeout performed, Emergency Drugs available, Suction available and Patient being monitored Patient Re-evaluated:Patient Re-evaluated prior to inductionOxygen Delivery Method: Circle system utilized Preoxygenation: Pre-oxygenation with 100% oxygen Intubation Type: IV induction Ventilation: Mask ventilation without difficulty LMA: LMA inserted LMA Size: 4.0 Number of attempts: 1 Placement Confirmation: positive ETCO2 and breath sounds checked- equal and bilateral Tube secured with: Tape Dental Injury: Teeth and Oropharynx as per pre-operative assessment

## 2012-02-27 ENCOUNTER — Encounter (HOSPITAL_COMMUNITY): Payer: Self-pay | Admitting: Orthopedic Surgery

## 2012-03-27 ENCOUNTER — Ambulatory Visit (HOSPITAL_COMMUNITY)
Admission: RE | Admit: 2012-03-27 | Discharge: 2012-03-27 | Disposition: A | Discharge status: Home / Self Care | Payer: 59 | Source: Ambulatory Visit | Attending: Orthopedic Surgery | Admitting: Orthopedic Surgery

## 2012-03-27 DIAGNOSIS — M6281 Muscle weakness (generalized): Secondary | ICD-10-CM | POA: Insufficient documentation

## 2012-03-27 DIAGNOSIS — IMO0001 Reserved for inherently not codable concepts without codable children: Secondary | ICD-10-CM | POA: Insufficient documentation

## 2012-03-27 DIAGNOSIS — R279 Unspecified lack of coordination: Secondary | ICD-10-CM | POA: Insufficient documentation

## 2012-03-27 DIAGNOSIS — M25649 Stiffness of unspecified hand, not elsewhere classified: Secondary | ICD-10-CM | POA: Insufficient documentation

## 2012-03-27 DIAGNOSIS — S5420XA Injury of radial nerve at forearm level, unspecified arm, initial encounter: Secondary | ICD-10-CM | POA: Insufficient documentation

## 2012-03-27 NOTE — Evaluation (Signed)
Occupational Therapy Evaluation  Patient Details  Name: Stacey Lawrence MRN: [id number] Date of Birth: Sep 14, 1951  Today's Date: 03/27/2012 Time: 4098-1191 OT Time Calculation (min): 40 min OT Eval 20' Manual Therapy 20' Visit#: 1  of 12   Re-eval: 04/24/12  Assessment Diagnosis: SP Right Thumb I&D of Superficial Radial Nerve Surgical Date: 02/25/12 Next MD Visit: 04/11/12 Prior Therapy: not for this injury  Past Medical History:  Past Medical History  Diagnosis Date  . PONV (postoperative nausea and vomiting)   . Fibromyalgia   . Headache     migraines and cluster;last migraine 3 mon ago  . Joint pain   . Joint swelling   . Scoliosis   . GERD (gastroesophageal reflux disease)     hasn't started taking any meds  . IBS (irritable bowel syndrome)     constipation  . Hx of colonic polyps   . Kidney stones 03/2010  . History of blood transfusion     34yrs ago  . Polycythemia    Past Surgical History:  Past Surgical History  Procedure Date  . Abdominal hysterectomy     part. hyst.  . Tubal ligation   . Cholecystectomy 10/28/2002    lap.  . Debridement tennis elbow   . Elbow surgery     golfer's elbow-bilateral  . Knee debridement     right  . Appendectomy     with explor. lap.  . Stapedectomy     right  . Trigger finger release 08/19/2011    Procedure: RELEASE TRIGGER FINGER/A-1 PULLEY;  Surgeon: Oletta Cohn III;  Location: [facility];  Service: Orthopedics;  Laterality: Right;  right middle finger a-1 pulley release with tenosynovectomy  . Tonsillectomy and adenoidectomy   . Diagnostic laparoscopy   . Dilation and curettage of uterus   . Tympanoplasty   . Colonoscopy   . I&d extremity 02/25/2012    Procedure: IRRIGATION AND DEBRIDEMENT EXTREMITY;  Surgeon: Dominica Severin, MD;  Location: [facility] OR;  Service: Orthopedics;  Laterality: Right;  Irrigation and Debridement and Repair Right Thumb Nerve Laceration and Assoiated Structures      Subjective S:  I was washing dishes on 02/11/12 and a glass broke and cut my thumb open. Pertinent History: Mrs. Neomia Dear was washing dishes on 02/11/12, when a broken glass cut her thumb.  She went to the ER and had it sewn up.  She consulted with Dr. Amanda Pea 2 weeks later and had emergency surgery on 02/25/12 to repair the superficial radial nerve, which was completely severed, and a ligament and tendon that were partially severed.  After her surgery, her hand was casted x 2 weeks and she has been in a removable thumb spica splint for 1 week.  Per Dr. Carlos Levering order on 03/21/12, she is to transition to a neoprene splint in 2 weeks (04/04/12).  Initial orders are for gentle IROM out of splint to IPJ, MCPJ, and CMC of right thumb.  She returns to the MD on 04/11/12 and we will progress with her therapy once additional orders are recieved. Patient Stated Goals: I want to get it done and get back to work.  I don't like missing work. Pain Assessment Currently in Pain?: No/denies  Precautions/Restrictions  Precautions Precautions:  (IROM out of splint x 2 weeks)  Prior Functioning  Home Living Lives With: Spouse Available Help at Discharge: Family Prior Function Comments: Independent with all B/IADLS, work, leisure, driving.  Enjoys sewing, cooking, gardening, and spending time with her grandchildren.  Assessment ADL/Vision/Perception ADL ADL Comments: Unable to use her right thumb when manipulating objects.  She is able to use her IF and LF to pick up items by adducting the digits together. Dominant Hand: Right Vision - History Baseline Vision: No visual deficits Perception Perception: Within Functional Limits Praxis Praxis: Intact  Cognition/Observation Cognition Overall Cognitive Status: Appears within functional limits for tasks assessed Observation/Other Assessments Observations: Surgical incision is healing well. Other Assessments: Administer  DASH  Sensation/Coordination/Edema Sensation Light Touch: Impaired Detail Light Touch Impaired Details:  (4.31 (dimished protective sensation) in thumb, distal to inc) Coordination Fine Motor Movements are Fluid and Coordinated:  (webspace R:  4.8 cm L: 6.0 cm) 9 Hole Peg Test: will administer at 6 weeks postop Edema Edema: assessed at wrist crease:  Right 15.9 cm Left 15.5 cm.  IPJ of thumb: Right 6.5 cm  Left 6.0 cm.  Additional Assessments RUE AROM (degrees) RUE Overall AROM Comments: Assessed in seated, PROM WFL Right Wrist Extension: 34 Degrees Right Wrist Flexion: 30 Degrees Right Composite Finger Extension:  (thumb MCPJ 30 IPJ 20) Right Thumb Opposition: Digit 4 RUE Strength RUE Overall Strength Comments: strength testing deferred until 6 weeks post op Palpation Palpation: moderate fascial and scar restrictions noted in her right thumb, thenar eminence and wrist     Exercise/Treatments    Manual Therapy Manual Therapy: Myofascial release Myofascial Release: MFR and gentle scar release to right flexor and extensor forearm, wrist, and thumb with very gentle PROM within available ROM to wrist, MCPJ, and IPJ.  Occupational Therapy Assessment and Plan OT Assessment and Plan Clinical Impression Statement: A:  61 year old with laceration of superficial radial nerve along right thumb.  Patient unable to use her right thumb actively with any daily activities.  Patient will benefit from skilled OT to address following deficits:   Pt will benefit from skilled therapeutic intervention in order to improve on the following deficits: Decreased range of motion;Decreased strength;Impaired sensation;Increased fascial restricitons;Increased edema;Decreased coordination;Pain Rehab Potential: Excellent OT Frequency: Min 2X/week OT Duration: 6 weeks OT Treatment/Interventions: Self-care/ADL training;Therapeutic exercise;Neuromuscular education;Manual therapy;Splinting;Patient/family  education OT Plan: P:  Skilled OT intervention to decrease pain, edema, and fascial restrictions and increase ROM, strength, coordination in RUE in order to use RUE as dominant with all daily activities.  Treatment Plan:  MFR and very gentle PROM to flexor and extensor forearm, wrist, and thumb.  AROM/IROM of wrist and thumb.     Goals Short Term Goals Time to Complete Short Term Goals: 3 weeks Short Term Goal 1: Patient will be educated on HEP. Short Term Goal 2: FMC, grip and pinch strength will be assessed. Short Term Goal 3: Decrease fascial restrictions from moderate to min-mod in her right forearm, wrist, and thumb. Short Term Goal 4: Increase sensation along superficial radial nerve path from decreased protective sensation to diminished light touch sensation for increased safety when cooking. Short Term Goal 5: Decrease edema by .15 in her right hand and wrist. Long Term Goals Time to Complete Long Term Goals: 6 weeks Long Term Goal 1: Patient will return to prior level of I with all B/IADLs, work, and leisure activities using right hand as dominant. Long Term Goal 2: Patient will increase right wrist and thumb AROM to WNL for increased ability to thread items through scopes at work. Long Term Goal 4: Patient will increase right grip and pinch strength to 90% of left for increased ability to squeeze out washclothes. Long Term Goal 5: Patient will complete NHPT  in 25 seconds or less for increased ability to fasten earrings. Additional Long Term Goals?: Yes Long Term Goal 6: Patient will increase sensation along radial nerve path to diminished light touch sensation or better for increased safety during cooking and cleaning tasks. Long Term Goal 7: Patient will decrease edema by .25 cm in her right hand.  Problem List Patient Active Problem List  Diagnosis  . Radial nerve laceration  . Lack of coordination  . Muscle weakness (generalized)    End of Session Activity Tolerance:  Patient tolerated treatment well General Behavior During Session: [facility] for tasks performed Cognition: [facility] for tasks performed OT Plan of Care OT Home Exercise Plan: Educated patient on HEP For wrist AROM and thumb IROM. Consulted and Agree with Plan of Care: Patient  GO    Shirlean Mylar, OTR/L  03/27/2012, 3:17 PM  Physician Documentation Your signature is required to indicate approval of the treatment plan as stated above.  Please sign and either send electronically or make a copy of this report for your files and return this physician signed original.  Please mark one 1.__approve of plan  2. ___approve of plan with the following conditions.   ______________________________                                                          _____________________ Physician Signature                                                                                                             Date

## 2012-03-28 ENCOUNTER — Ambulatory Visit (HOSPITAL_COMMUNITY)
Admission: RE | Admit: 2012-03-28 | Discharge: 2012-03-28 | Disposition: A | Discharge status: Home / Self Care | Payer: 59 | Source: Ambulatory Visit | Attending: Internal Medicine | Admitting: Internal Medicine

## 2012-03-28 DIAGNOSIS — R279 Unspecified lack of coordination: Secondary | ICD-10-CM

## 2012-03-28 DIAGNOSIS — M6281 Muscle weakness (generalized): Secondary | ICD-10-CM

## 2012-03-28 DIAGNOSIS — S5420XA Injury of radial nerve at forearm level, unspecified arm, initial encounter: Secondary | ICD-10-CM

## 2012-03-28 NOTE — Progress Notes (Signed)
Note reviewed by clinical instructor and accurately reflects treatment session.  Ernestina Joe L. Maximilliano Kersh, COTA/L  

## 2012-03-28 NOTE — Progress Notes (Signed)
Occupational Therapy Treatment Patient Details  Name: Stacey Lawrence MRN: [id number] Date of Birth: [birth year]  Today's Date: 03/28/2012 Time: 4098-1191 OT Time Calculation (min): 37 min  Manual Therapy: 4782-9562 22' Therapeutic Exercise: 1323-1340 15' Visit#: 2  of 12   Re-eval: 04/24/12    Subjective Symptoms/Limitations Symptoms: S: My thumb has a weird sensation, like caterpillars are crawling all over it. I'm sure to be real careful with this hand at home. Pain Assessment Currently in Pain?: No/denies  Precautions/Restrictions   IROM out of splint x 2 weeks   Exercise/Treatments Wrist Exercises Forearm Supination: AROM;10 reps Forearm Pronation: AROM;10 reps Wrist Flexion: AROM;10 reps Wrist Extension: AROM;10 reps Wrist Radial Deviation: AROM;10 reps Wrist Ulnar Deviation: AROM;10 reps   Sponges: 6,9,7      Hand Exercises MCPJ Flexion: PROM;Other (comment);10 reps (IROM) MCPJ Extension: PROM;Other (comment);10 reps (IROM) PIPJ Flexion: PROM;Other (comment);10 reps (IROM) PIPJ Extension: PROM;Other (comment);10 reps (IROM) DIPJ Flexion: PROM;Other (comment);10 reps (IROM) DIPJ Extension: PROM;Other (comment);10 reps (IROM) Sponges: 6,9,7     Manual Therapy Manual Therapy: Myofascial release Myofascial Release: MFR and gentle scar release to right flexor and extensor forearm, anterior and posterior wrist, and thumb with very gentle PROM within available ROM to wrist, MCPJ, and IPJ.  Occupational Therapy Assessment and Plan OT Assessment and Plan Clinical Impression Statement: A: IROM of all thumb joints (CMC, MCP, PIP) within patient's tolerance. Gentle PROM also performed. Patient tolerated movements well; no reports of pain just a tingling/ticking sensation in thumb with P/IROM and MFR. Patient has difficulty keeping sponges in her palm when picking up more sponges.  OT Plan: P: Continue with P/IROM within patient's tolerance.    Goals Short Term  Goals Time to Complete Short Term Goals: 3 weeks Short Term Goal 1: Patient will be educated on HEP. Short Term Goal 1 Progress: Progressing toward goal Short Term Goal 2: FMC, grip and pinch strength will be assessed. Short Term Goal 2 Progress: Progressing toward goal Short Term Goal 3: Decrease fascial restrictions from moderate to min-mod in her right forearm, wrist, and thumb. Short Term Goal 3 Progress: Progressing toward goal Short Term Goal 4: Increase sensation along superficial radial nerve path from decreased protective sensation to diminished light touch sensation for increased safety when cooking. Short Term Goal 4 Progress: Progressing toward goal Short Term Goal 5: Decrease edema by .15 in her right hand and wrist. Short Term Goal 5 Progress: Progressing toward goal Long Term Goals Time to Complete Long Term Goals: 6 weeks Long Term Goal 1: Patient will return to prior level of I with all B/IADLs, work, and leisure activities using right hand as dominant. Long Term Goal 1 Progress: Progressing toward goal Long Term Goal 2: Patient will increase right wrist and thumb AROM to WNL for increased ability to thread items through scopes at work. Long Term Goal 2 Progress: Progressing toward goal Long Term Goal 3 Progress: Progressing toward goal Long Term Goal 4: Patient will increase right grip and pinch strength to 90% of left for increased ability to squeeze out washclothes. Long Term Goal 4 Progress: Progressing toward goal Long Term Goal 5: Patient will complete NHPT in 25 seconds or less for increased ability to fasten earrings. Long Term Goal 5 Progress: Progressing toward goal Additional Long Term Goals?: Yes Long Term Goal 6: Patient will increase sensation along radial nerve path to diminished light touch sensation or better for increased safety during cooking and cleaning tasks. Long Term Goal 6 Progress: Progressing toward  goal Long Term Goal 7: Patient will decrease  edema by .25 cm in her right hand. Long Term Goal 7 Progress: Progressing toward goal  Problem List Patient Active Problem List  Diagnosis  . Radial nerve laceration  . Lack of coordination  . Muscle weakness (generalized)    End of Session Activity Tolerance: Patient tolerated treatment well General Behavior During Session: [facility] for tasks performed Cognition: [facility] for tasks performed  GO   Laverta Baltimore, OTS Occupational Therapy Student  03/28/2012, 3:33 PM

## 2012-04-02 ENCOUNTER — Ambulatory Visit (HOSPITAL_COMMUNITY)
Admission: RE | Admit: 2012-04-02 | Discharge: 2012-04-02 | Disposition: A | Discharge status: Home / Self Care | Payer: 59 | Source: Ambulatory Visit | Attending: Specialist | Admitting: Specialist

## 2012-04-02 DIAGNOSIS — M6281 Muscle weakness (generalized): Secondary | ICD-10-CM

## 2012-04-02 DIAGNOSIS — S5420XA Injury of radial nerve at forearm level, unspecified arm, initial encounter: Secondary | ICD-10-CM

## 2012-04-02 DIAGNOSIS — R279 Unspecified lack of coordination: Secondary | ICD-10-CM

## 2012-04-02 NOTE — Progress Notes (Signed)
Occupational Therapy Treatment Patient Details  Name: Stacey Lawrence MRN: [id number] Date of Birth: [birth year]  Today's Date: 04/02/2012 Time: 4098-1191 OT Time Calculation (min): 43 min  Manual Therapy: 4782-9562 23' Therapeutic Exercise: 1405-1425 20' Visit#: 3  of 12   Re-eval: 04/24/12    Subjective Symptoms/Limitations Symptoms: S: I hit my R hand as I was reaching into the freezer last night - right at the base of my thumb. It hurt for about a minute but then went away. Pain Assessment Currently in Pain?: Other (Comment) (No pain; an "odd" tingling sensation)  Precautions/Restrictions   P/IROM until 7/22  Exercise/Treatments Wrist Exercises Forearm Supination: AROM;Other reps (comment);Seated (12) Forearm Pronation: AROM;Other reps (comment);Seated (12) Wrist Flexion: AROM;Other reps (comment);Seated (12) Wrist Extension: AROM;Other reps (comment);Seated (12) Wrist Radial Deviation: AROM;Other reps (comment);Seated (12) Wrist Ulnar Deviation: AROM;Other reps (comment);Seated (12)   Sponges: 10, 6, 7     Hand Exercises MCPJ Flexion: PROM;Other (comment) (IROM, 12 reps) MCPJ Extension: PROM;Other (comment) (IROM, 12 reps) PIPJ Flexion: PROM;Other (comment) (IROM, 12 reps) PIPJ Extension: PROM;Other (comment) (IROM, 12 reps) DIPJ Flexion: PROM;Other (comment) (IROM, 12 reps) DIPJ Extension: PROM;Other (comment) (IROM, 12 reps) Sponges: 10, 6, 7 Large Pegboard: x 12 placed and took out Other Hand Exercises: Groove pegboard - put all pegs in then took out     Manual Therapy Manual Therapy: Myofascial release Myofascial Release: MFR and gentle scar release to right flexor and extensor forearm, anterior and posterior wrist and thumb. Increased focus on posterior wrist this visit as patient reported pain and tightness from that region. Very gentle PROM also performed within available ROM to wrist, MCP and IP joints of R thumb.  Occupational Therapy Assessment and  Plan OT Assessment and Plan Clinical Impression Statement: A: Patient demonstrates increase in IROM and is able to oppose thumb to all digits. Added 2 fine motor coordination and in-hand manipulation exercises this visit. Patient reports extreme sensitivity to pressure from DIP joint to tip of thumb, sending a painful ticking sesnation down thumb and into wrist. Very gentle MFR and PROM performed in this region due to sensitivity. OT Plan: P: Continue with P/IROM until 7/22. Administer 9-hole peg test to assess [facility].    Goals Short Term Goals Time to Complete Short Term Goals: 3 weeks Short Term Goal 1: Patient will be educated on HEP. Short Term Goal 1 Progress: Progressing toward goal Short Term Goal 2: FMC, grip and pinch strength will be assessed. Short Term Goal 2 Progress: Progressing toward goal Short Term Goal 3: Decrease fascial restrictions from moderate to min-mod in her right forearm, wrist, and thumb. Short Term Goal 3 Progress: Progressing toward goal Short Term Goal 4: Increase sensation along superficial radial nerve path from decreased protective sensation to diminished light touch sensation for increased safety when cooking. Short Term Goal 4 Progress: Progressing toward goal Short Term Goal 5: Decrease edema by .15 in her right hand and wrist. Short Term Goal 5 Progress: Progressing toward goal Long Term Goals Time to Complete Long Term Goals: 6 weeks Long Term Goal 1: Patient will return to prior level of I with all B/IADLs, work, and leisure activities using right hand as dominant. Long Term Goal 1 Progress: Progressing toward goal Long Term Goal 2: Patient will increase right wrist and thumb AROM to WNL for increased ability to thread items through scopes at work. Long Term Goal 2 Progress: Progressing toward goal Long Term Goal 4: Patient will increase right grip and pinch strength to  90% of left for increased ability to squeeze out washclothes. Long Term Goal 4  Progress: Progressing toward goal Long Term Goal 5: Patient will complete NHPT in 25 seconds or less for increased ability to fasten earrings. Long Term Goal 5 Progress: Progressing toward goal Additional Long Term Goals?: Yes Long Term Goal 6: Patient will increase sensation along radial nerve path to diminished light touch sensation or better for increased safety during cooking and cleaning tasks. Long Term Goal 6 Progress: Progressing toward goal Long Term Goal 7: Patient will decrease edema by .25 cm in her right hand. Long Term Goal 7 Progress: Progressing toward goal  Problem List Patient Active Problem List  Diagnosis  . Radial nerve laceration  . Lack of coordination  . Muscle weakness (generalized)    End of Session Activity Tolerance: Patient tolerated treatment well General Behavior During Session: [facility] for tasks performed Cognition: [facility] for tasks performed  GO   Laverta Baltimore, OTS Occupational Therapy Student  04/02/2012, 3:35 PM

## 2012-04-02 NOTE — Progress Notes (Signed)
Note reviewed by clinical instructor and accurately reflects treatment session.  Natacha Jepsen H. Marlyss Cissell, OTR/L  

## 2012-04-03 ENCOUNTER — Ambulatory Visit (HOSPITAL_COMMUNITY): Payer: 59 | Admitting: Occupational Therapy

## 2012-04-05 ENCOUNTER — Ambulatory Visit (HOSPITAL_COMMUNITY)
Admission: RE | Admit: 2012-04-05 | Discharge: 2012-04-05 | Disposition: A | Discharge status: Home / Self Care | Payer: 59 | Source: Ambulatory Visit | Attending: Internal Medicine | Admitting: Internal Medicine

## 2012-04-05 DIAGNOSIS — M6281 Muscle weakness (generalized): Secondary | ICD-10-CM

## 2012-04-05 DIAGNOSIS — S5420XA Injury of radial nerve at forearm level, unspecified arm, initial encounter: Secondary | ICD-10-CM

## 2012-04-05 DIAGNOSIS — R279 Unspecified lack of coordination: Secondary | ICD-10-CM

## 2012-04-05 NOTE — Progress Notes (Signed)
Occupational Therapy Treatment Patient Details  Name: Stacey Lawrence MRN: [id number] Date of Birth: [birth year]  Today's Date: 04/05/2012 Time: 6578-4696 OT Time Calculation (min): 45 min Manual Therapy 1030-1057 27' Therapeutic Exercises [phone] 18' Visit#: 4  of 12   Re-eval: 04/24/12 (Reassess next visit for MD appointment)    Subjective S:  I haven't worn the neoprene brace yet because I went to [employer].  I'll start wearing it today. Pain Assessment Currently in Pain?: No/denies  Precautions/Restrictions   PROM and IROM through 04/09/12  Exercise/Treatments Wrist Exercises Forearm Supination: AROM;Right;15 reps;Seated Forearm Pronation: AROM;Right;15 reps;Seated Wrist Flexion: AROM;Right;15 reps;Seated Wrist Extension: AROM;Right;15 reps;Seated Wrist Radial Deviation: AROM;Right;15 reps;Seated Wrist Ulnar Deviation: AROM;Right;15 reps;Seated   Sponges: 10, 13, 15  Hand Exercises MCPJ Flexion: PROM;Self ROM;10 reps MCPJ Extension: PROM;Self ROM;10 reps PIPJ Flexion: PROM;Self ROM;10 reps PIPJ Extension: PROM;Self ROM;10 reps Sponges: 10, 13, 15 Large Pegboard: x 15 - minimal discomfort when placing pegs Small Pegboard: grooved pegboard without difficulty - add in hand manipulation of pegs at next visit Fine Motor Coordination: Small Pegboard     Manual Therapy Manual Therapy: Myofascial release Myofascial Release: MFR and manual stretching to right flexor, extensor, and radial border of forearm, wrist joint, and thumb to decrease pain and restrictions and increase PROM.  Gentle PROM into wrist flexion, extension, radial deviation, thumb MCPJ and IPJ flexion. 1030-1057 Splinting Splinting: Applied neoprene CMC brace provided by physicians office today.  Patient demonstrated independence donning the brace and reported that it felt "different" than her previous splint.  Occupational Therapy Assessment and Plan OT Assessment and Plan Clinical Impression Statement:  A:  Assessed FMC with Nine Hole Peg Test:  Right 22.59" Left 18.43".  Transitioned to neoprene CMC brace from hard thumb spica splint this date. OT Plan: P:  Continue PROM through 04/09/12, Reassess for MD visit, Add weighted stretch for wrist flexion, extension, radial deviation.   Goals Short Term Goals Time to Complete Short Term Goals: 3 weeks Short Term Goal 1: Patient will be educated on HEP. Short Term Goal 2: FMC, grip and pinch strength will be assessed. Short Term Goal 3: Decrease fascial restrictions from moderate to min-mod in her right forearm, wrist, and thumb. Short Term Goal 4: Increase sensation along superficial radial nerve path from decreased protective sensation to diminished light touch sensation for increased safety when cooking. Short Term Goal 5: Decrease edema by .15 in her right hand and wrist. Long Term Goals Time to Complete Long Term Goals: 6 weeks Long Term Goal 1: Patient will return to prior level of I with all B/IADLs, work, and leisure activities using right hand as dominant. Long Term Goal 2: Patient will increase right wrist and thumb AROM to WNL for increased ability to thread items through scopes at work. Long Term Goal 4: Patient will increase right grip and pinch strength to 90% of left for increased ability to squeeze out washclothes. Long Term Goal 5: Patient will complete NHPT in 25 seconds or less for increased ability to fasten earrings. Additional Long Term Goals?: Yes Long Term Goal 6: Patient will increase sensation along radial nerve path to diminished light touch sensation or better for increased safety during cooking and cleaning tasks. Long Term Goal 7: Patient will decrease edema by .25 cm in her right hand.  Problem List Patient Active Problem List  Diagnosis  . Radial nerve laceration  . Lack of coordination  . Muscle weakness (generalized)    End of Session Activity Tolerance: Patient  tolerated treatment well General Behavior  During Session: [facility] for tasks performed Cognition: [facility] for tasks performed  GO    Shirlean Mylar, OTR/L  04/05/2012, 11:56 AM

## 2012-04-10 ENCOUNTER — Ambulatory Visit (HOSPITAL_COMMUNITY)
Admission: RE | Admit: 2012-04-10 | Discharge: 2012-04-10 | Disposition: A | Discharge status: Home / Self Care | Payer: 59 | Source: Ambulatory Visit | Attending: Internal Medicine | Admitting: Internal Medicine

## 2012-04-10 NOTE — Progress Notes (Signed)
Note reviewed by clinical instructor and accurately reflects treatment session.  Bethany H. Murray, OTR/L  

## 2012-04-10 NOTE — Progress Notes (Addendum)
Occupational Therapy Treatment Patient Details  Name: Stacey Lawrence MRN: [id number] Date of Birth: [birth year]  Today's Date: 04/10/2012 Time: 2130-8657 OT Time Calculation (min): 43 min Manual Therapy: 20 min Therapeutic Exercise: 23 min   Visit#: 5  of 12   Re-eval: 05/08/12     Subjective Symptoms/Limitations Symptoms: S: I had to cut my splint because it was too tight. Special Tests: DASH score: 45.45 Pain Assessment Currently in Pain?: No/denies  Precautions/Restrictions     Exercise/Treatments Wrist Exercises Forearm Supination: AROM;Right;15 reps;Seated Forearm Pronation: AROM;Right;15 reps;Seated Wrist Flexion: AROM;Right;15 reps;Seated Wrist Extension: AROM;Right;15 reps;Seated Wrist Radial Deviation: AROM;Right;15 reps;Seated Wrist Ulnar Deviation: AROM;Right;15 reps;Seated    Wrist Weighted Stretch Wrist Flexion - Weighted Stretch: 60 seconds (1lb) Wrist Extension - Weighted Stretch: 60 seconds (1lb) Radial Deviation - Weighted Stretch: 60 seconds;Other (comment) (1lb)  Fine Motor Coordination  Hand Exercises Sponges: 15,18,12 Fine Motor Coordination:  (Screwed and unscrewed small and large nuts. Min difficulty)     Manual Therapy Manual Therapy: Myofascial release Myofascial Release: MFR and manual stretching to right flexor, extensor, and radial border of forearm, wrist joint, and thumb to decrease pain and restrictions and increase PROM.  Occupational Therapy Assessment and Plan OT Assessment and Plan Clinical Impression Statement: A: Refer to MD progress note for details. Added weighted stretch for wrist to gain mobility. OT Plan: P: Continue 2x/week for 4 weeks. Begin strengthening and continue to increase thumb ROM.   Goals Short Term Goals Time to Complete Short Term Goals: 3 weeks Short Term Goal 1: Patient will be educated on HEP. Short Term Goal 1 Progress: Met Short Term Goal 2: FMC, grip and pinch strength will be assessed. Short Term  Goal 2 Progress: Progressing toward goal Short Term Goal 3: Decrease fascial restrictions from moderate to min-mod in her right forearm, wrist, and thumb. Short Term Goal 3 Progress: Met Short Term Goal 4: Increase sensation along superficial radial nerve path from decreased protective sensation to diminished light touch sensation for increased safety when cooking. Short Term Goal 4 Progress: Met Short Term Goal 5: Decrease edema by .15 in her right hand and wrist. Short Term Goal 5 Progress: Met Long Term Goals Time to Complete Long Term Goals: 6 weeks Long Term Goal 1: Patient will return to prior level of I with all B/IADLs, work, and leisure activities using right hand as dominant. Long Term Goal 1 Progress: Progressing toward goal Long Term Goal 2: Patient will increase right wrist and thumb AROM to WNL for increased ability to thread items through scopes at work. Long Term Goal 2 Progress: Progressing toward goal Long Term Goal 4: Patient will increase right grip and pinch strength to 90% of left for increased ability to squeeze out washclothes. Long Term Goal 4 Progress: Progressing toward goal Long Term Goal 5: Patient will complete NHPT in 25 seconds or less for increased ability to fasten earrings. Long Term Goal 5 Progress: Progressing toward goal Additional Long Term Goals?: Yes Long Term Goal 6: Patient will increase sensation along radial nerve path to diminished light touch sensation or better for increased safety during cooking and cleaning tasks. Long Term Goal 6 Progress: Progressing toward goal Long Term Goal 7: Patient will decrease edema by .25 cm in her right hand. Long Term Goal 7 Progress: Progressing toward goal  Problem List Patient Active Problem List  Diagnosis  . Radial nerve laceration  . Lack of coordination  . Muscle weakness (generalized)    End of Session Activity  Tolerance: Patient tolerated treatment well General Behavior During Session: [facility] for  tasks performed Cognition: [facility] for tasks performed   Laverta Baltimore 04/10/2012, 3:24 PM

## 2012-04-12 ENCOUNTER — Ambulatory Visit (HOSPITAL_COMMUNITY)
Admission: RE | Admit: 2012-04-12 | Discharge: 2012-04-12 | Disposition: A | Discharge status: Home / Self Care | Payer: 59 | Source: Ambulatory Visit | Attending: Internal Medicine | Admitting: Internal Medicine

## 2012-04-12 DIAGNOSIS — S5420XA Injury of radial nerve at forearm level, unspecified arm, initial encounter: Secondary | ICD-10-CM

## 2012-04-12 DIAGNOSIS — R279 Unspecified lack of coordination: Secondary | ICD-10-CM

## 2012-04-12 DIAGNOSIS — M6281 Muscle weakness (generalized): Secondary | ICD-10-CM

## 2012-04-12 NOTE — Progress Notes (Signed)
Occupational Therapy Treatment Patient Details  Name: Stacey Lawrence MRN: [id number] Date of Birth: Jul 10, 1951  Today's Date: 04/12/2012 Time: 6295-2841 OT Time Calculation (min): 32 min Manual Therapy: 1305-1320 15 min Therapeutic Activities: 1320-1337 17 min  Visit#: 6  of 12    Subjective Symptoms/Limitations Symptoms: S: The doctor told me to take my splint off and use my hand as much as possible. I go back to work next week.   Exercise/Treatments Wrist Exercises Wrist Flexion: AROM;Right;15 reps;Seated Wrist Extension: AROM;Right;15 reps;Seated Wrist Radial Deviation: AROM;Right;15 reps;Seated Wrist Ulnar Deviation: AROM;Right;15 reps;Seated   Wrist Weighted Stretch Wrist Flexion - Weighted Stretch: 60 seconds (2lb) Wrist Extension - Weighted Stretch: 60 seconds (2lb) Radial Deviation - Weighted Stretch: 60 seconds;Other (comment) (2lb)   Hand Exercises Sponges: 32,44,01 Fine Motor Coordination:  (Pinched wooden and yellow clothespins; RUE; 10 reps; 1 set) Other Hand Exercises: Picked up 10 large beads and 15 small beads with right hand. Finger to palm manipulation then palm of hand to finger bead sequence. min difficulty with large beads. mod difficulty with small beads. Other Hand Exercises: 2 silver Chinese balls; 5 complete rotations made in palm of hand.     Manual Therapy Manual Therapy: Myofascial release Myofascial Release: MFR and manual stretching to right flexor, extensor, and radial border of forearm, wrist joint, and thumb to decrease pain and restrictions and increase PROM.  Occupational Therapy Assessment and Plan OT Assessment and Plan Clinical Impression Statement: A: Increased edema in thumb this visit. Due to increased edema defer strengthening exercises until next session. Patient had difficulty tolerating sponges activity due to edema.  OT Plan: P: Begin strengthening and continue to increase thumb ROM. Try small in hand manipulation task (ie.  pennies).   Goals Short Term Goals Time to Complete Short Term Goals: 3 weeks Short Term Goal 1: Patient will be educated on HEP. Short Term Goal 2: FMC, grip and pinch strength will be assessed. Short Term Goal 3: Decrease fascial restrictions from moderate to min-mod in her right forearm, wrist, and thumb. Short Term Goal 4: Increase sensation along superficial radial nerve path from decreased protective sensation to diminished light touch sensation for increased safety when cooking. Short Term Goal 5: Decrease edema by .15 in her right hand and wrist. Long Term Goals Time to Complete Long Term Goals: 6 weeks Long Term Goal 1: Patient will return to prior level of I with all B/IADLs, work, and leisure activities using right hand as dominant. Long Term Goal 2: Patient will increase right wrist and thumb AROM to WNL for increased ability to thread items through scopes at work. Long Term Goal 4: Patient will increase right grip and pinch strength to 90% of left for increased ability to squeeze out washclothes. Long Term Goal 5: Patient will complete NHPT in 25 seconds or less for increased ability to fasten earrings. Additional Long Term Goals?: Yes Long Term Goal 6: Patient will increase sensation along radial nerve path to diminished light touch sensation or better for increased safety during cooking and cleaning tasks. Long Term Goal 7: Patient will decrease edema by .25 cm in her right hand.  Problem List Patient Active Problem List  Diagnosis  . Radial nerve laceration  . Lack of coordination  . Muscle weakness (generalized)    End of Session Activity Tolerance: Patient tolerated treatment well General Behavior During Session: [facility] for tasks performed Cognition: [facility] for tasks performed   Laverta Baltimore 04/12/2012, 1:42 PM

## 2012-04-12 NOTE — Progress Notes (Signed)
Note reviewed by clinical instructor and accurately reflects treatment session.  Bethany H. Murray, OTR/L  

## 2012-04-17 ENCOUNTER — Inpatient Hospital Stay (HOSPITAL_COMMUNITY): Admission: RE | Admit: 2012-04-17 | Payer: 59 | Source: Ambulatory Visit | Admitting: Occupational Therapy

## 2012-04-20 ENCOUNTER — Inpatient Hospital Stay (HOSPITAL_COMMUNITY)
Admission: RE | Admit: 2012-04-20 | Discharge: 2012-04-20 | Payer: 59 | Source: Ambulatory Visit | Attending: Occupational Therapy | Admitting: Occupational Therapy

## 2012-04-20 DIAGNOSIS — R279 Unspecified lack of coordination: Secondary | ICD-10-CM

## 2012-04-20 DIAGNOSIS — M6281 Muscle weakness (generalized): Secondary | ICD-10-CM

## 2012-04-20 DIAGNOSIS — S5420XA Injury of radial nerve at forearm level, unspecified arm, initial encounter: Secondary | ICD-10-CM

## 2012-06-19 ENCOUNTER — Other Ambulatory Visit (HOSPITAL_COMMUNITY): Payer: Self-pay | Admitting: Obstetrics and Gynecology

## 2012-06-19 DIAGNOSIS — Z139 Encounter for screening, unspecified: Secondary | ICD-10-CM

## 2012-07-23 ENCOUNTER — Ambulatory Visit (HOSPITAL_COMMUNITY)
Admission: RE | Admit: 2012-07-23 | Discharge: 2012-07-23 | Disposition: A | Discharge status: Home / Self Care | Payer: 59 | Source: Ambulatory Visit | Attending: Obstetrics and Gynecology | Admitting: Obstetrics and Gynecology

## 2012-07-23 ENCOUNTER — Telehealth: Payer: Self-pay | Admitting: Gastroenterology

## 2012-07-23 DIAGNOSIS — Z139 Encounter for screening, unspecified: Secondary | ICD-10-CM

## 2012-07-23 DIAGNOSIS — Z1231 Encounter for screening mammogram for malignant neoplasm of breast: Secondary | ICD-10-CM | POA: Insufficient documentation

## 2012-07-23 MED ORDER — LUBIPROSTONE 24 MCG PO CAPS: ORAL_CAPSULE | ORAL | Status: DC

## 2012-07-23 NOTE — Telephone Encounter (Signed)
PT C/O CONSTIPATION. FAILED 8 MCG BID.

## 2012-08-22 ENCOUNTER — Other Ambulatory Visit (INDEPENDENT_AMBULATORY_CARE_PROVIDER_SITE_OTHER): Payer: Self-pay | Admitting: *Deleted

## 2012-08-22 DIAGNOSIS — R1013 Epigastric pain: Secondary | ICD-10-CM

## 2012-08-22 DIAGNOSIS — R634 Abnormal weight loss: Secondary | ICD-10-CM

## 2012-08-27 ENCOUNTER — Encounter (HOSPITAL_COMMUNITY): Payer: Self-pay | Admitting: Pharmacy Technician

## 2012-09-10 ENCOUNTER — Encounter (HOSPITAL_COMMUNITY)
Admission: RE | Disposition: A | Discharge status: Home / Self Care | Payer: Self-pay | Source: Ambulatory Visit | Attending: Internal Medicine

## 2012-09-10 ENCOUNTER — Ambulatory Visit (HOSPITAL_COMMUNITY)
Admission: RE | Admit: 2012-09-10 | Discharge: 2012-09-10 | Disposition: A | Discharge status: Home / Self Care | Payer: 59 | Source: Ambulatory Visit | Attending: Internal Medicine | Admitting: Internal Medicine

## 2012-09-10 ENCOUNTER — Encounter (HOSPITAL_COMMUNITY): Payer: Self-pay

## 2012-09-10 DIAGNOSIS — R1013 Epigastric pain: Secondary | ICD-10-CM

## 2012-09-10 DIAGNOSIS — K2940 Chronic atrophic gastritis without bleeding: Secondary | ICD-10-CM | POA: Insufficient documentation

## 2012-09-10 DIAGNOSIS — R634 Abnormal weight loss: Secondary | ICD-10-CM

## 2012-09-10 DIAGNOSIS — K228 Other specified diseases of esophagus: Secondary | ICD-10-CM

## 2012-09-10 DIAGNOSIS — D131 Benign neoplasm of stomach: Secondary | ICD-10-CM

## 2012-09-10 DIAGNOSIS — K449 Diaphragmatic hernia without obstruction or gangrene: Secondary | ICD-10-CM

## 2012-09-10 DIAGNOSIS — K2289 Other specified disease of esophagus: Secondary | ICD-10-CM

## 2012-09-10 DIAGNOSIS — R110 Nausea: Secondary | ICD-10-CM

## 2012-09-10 HISTORY — PX: ESOPHAGOGASTRODUODENOSCOPY: SHX5428

## 2012-09-10 SURGERY — EGD (ESOPHAGOGASTRODUODENOSCOPY)
Anesthesia: Moderate Sedation

## 2012-09-10 MED ORDER — DICYCLOMINE HCL 10 MG PO CAPS: 10.0000 mg | ORAL_CAPSULE | Freq: Two times a day (BID) | ORAL | Status: DC

## 2012-09-10 MED ORDER — PANTOPRAZOLE SODIUM 40 MG PO TBEC: 40.0000 mg | DELAYED_RELEASE_TABLET | Freq: Every day | ORAL | Status: DC

## 2012-09-10 MED ORDER — MIDAZOLAM HCL 5 MG/5ML IJ SOLN
INTRAMUSCULAR | Status: AC
  Filled 2012-09-10: qty 10

## 2012-09-10 MED ORDER — STERILE WATER FOR IRRIGATION IR SOLN
Status: DC | PRN
  Administered 2012-09-10: 08:00:00

## 2012-09-10 MED ORDER — MEPERIDINE HCL 25 MG/ML IJ SOLN
INTRAMUSCULAR | Status: DC | PRN
  Administered 2012-09-10: 25 mg via INTRAVENOUS

## 2012-09-10 MED ORDER — SODIUM CHLORIDE 0.45 % IV SOLN
INTRAVENOUS | Status: DC
  Administered 2012-09-10: 07:00:00 via INTRAVENOUS

## 2012-09-10 MED ORDER — BUTAMBEN-TETRACAINE-BENZOCAINE 2-2-14 % EX AERO
INHALATION_SPRAY | CUTANEOUS | Status: DC | PRN
  Administered 2012-09-10: 1 via TOPICAL

## 2012-09-10 MED ORDER — MIDAZOLAM HCL 5 MG/5ML IJ SOLN
INTRAMUSCULAR | Status: DC | PRN
  Administered 2012-09-10: 1 mg via INTRAVENOUS
  Administered 2012-09-10: 2 mg via INTRAVENOUS

## 2012-09-10 MED ORDER — MEPERIDINE HCL 50 MG/ML IJ SOLN
INTRAMUSCULAR | Status: AC
  Filled 2012-09-10: qty 1

## 2012-09-10 NOTE — Op Note (Signed)
EGD PROCEDURE REPORT  PATIENT:  Stacey Lawrence  MR#:  [id number] Birthdate:  01-11-1951, 61 y.o., female Endoscopist:  Dr. Malissa Hippo, MD Referred By:  Dr. Dwana Melena, MD Procedure Date: 09/10/2012  Procedure:   EGD  Indications:  Patient is 76 Caucasian female who presents with 6 month history of intermittent epigastric pain nausea anorexia and 30 pound weight loss. She had lab studies Dr. Catalina Pizza these were within normal limits including H. pylori serology. She is undergoing diagnostic EGD.            Informed Consent:  The risks, benefits, alternatives & imponderables which include, but are not limited to, bleeding, infection, perforation, drug reaction and potential missed lesion have been reviewed.  The potential for biopsy, lesion removal, esophageal dilation, etc. have also been discussed.  Questions have been answered.  All parties agreeable.  Please see history & physical in medical record for more information.  Medications:  Demerol 25 mg IV Versed 3 mg IV Cetacaine spray topically for oropharyngeal anesthesia  Description of procedure:  The endoscope was introduced through the mouth and advanced to the second portion of the duodenum without difficulty or limitations. The mucosal surfaces were surveyed very carefully during advancement of the scope and upon withdrawal.  Findings:  Esophagus:  Mucosa of the esophagus was normal. GE junction was unremarkable. GEJ:  38 cm Hiatus:  40 cm Stomach:  Stomach was empty and distended very well insufflation. Folds in the proximal stomach were normal. There was 3 mm hyperplastic appearing polyp in gastric body which was left alone. 2 small antral scars noted along with erythema and granularity. Pyloric channel was patent. Angularis fundus and cardia were examined by retroflexing the scope and were normal. Duodenum:  Normal bulbar and post bulbar mucosa.  Therapeutic/Diagnostic Maneuvers Performed:  Antral biopsies are taken for  routine histology.  Complications:  None  Impression: Small sliding heart hernia. 3 mm hyperplastic appearing polyp at gastric body which was left alone. Nonerosive antral gastritis along with 2 scars. Gastric biopsy taken from antrum for routine histology. These findings would not explain patient's symptoms.  Recommendations:  Pantoprazole 40 mg by mouth every morning. Dicyclomine 10 mg by mouth twice a day. I will be contacting patient with results of biopsy and further recommendations.  REHMAN,NAJEEB U  09/10/2012  8:03 AM  CC: Dr. Dwana Melena, MD & Dr. Bonnetta Barry ref. provider found

## 2012-09-10 NOTE — H&P (Addendum)
Stacey Lawrence is an 61 y.o. female.   Chief Complaint:  Patient is here for EGD. HPI: Patient is 61 year old Caucasian female who presents with 6 month history of intermittent nausea epigastric pain anorexia and 30 pound weight loss. She did try OTC medications without and relief of her symptoms. She's also been seen by Dr. Dwana Melena and lab studies been unremarkable. She denies melena or rectal bleeding. He has chronic constipation and takes Metamucil as well as OTC laxatives or stool softeners. She was tried on Amitiza but it didn't work.  Past Medical History  Diagnosis Date  . PONV (postoperative nausea and vomiting)   . Fibromyalgia   . Headache     migraines and cluster;last migraine 3 mon ago  . Joint pain   . Joint swelling   . Scoliosis   . GERD (gastroesophageal reflux disease)     hasn't started taking any meds  . IBS (irritable bowel syndrome)     constipation  . Hx of colonic polyps   . Kidney stones 03/2010  . History of blood transfusion     67yrs ago  . Polycythemia     Past Surgical History  Procedure Date  . Abdominal hysterectomy     part. hyst.  . Tubal ligation   . Cholecystectomy 10/28/2002    lap.  . Debridement tennis elbow   . Elbow surgery     golfer's elbow-bilateral  . Knee debridement     right  . Appendectomy     with explor. lap.  . Stapedectomy     right  . Trigger finger release 08/19/2011    Procedure: RELEASE TRIGGER FINGER/A-1 PULLEY;  Surgeon: Oletta Cohn III;  Location: [facility];  Service: Orthopedics;  Laterality: Right;  right middle finger a-1 pulley release with tenosynovectomy  . Tonsillectomy and adenoidectomy   . Diagnostic laparoscopy   . Dilation and curettage of uterus   . Tympanoplasty   . Colonoscopy   . I&d extremity 02/25/2012    Procedure: IRRIGATION AND DEBRIDEMENT EXTREMITY;  Surgeon: Dominica Severin, MD;  Location[facility] OR;  Service: Orthopedics;  Laterality: Right;  Irrigation and Debridement and  Repair Right Thumb Nerve Laceration and Assoiated Structures   . Laparotomy     History reviewed. No pertinent family history. Social History:  reports that she has never smoked. She has never used smokeless tobacco. She reports that she drinks alcohol. She reports that she does not use illicit drugs.  Allergies:  Allergies  Allergen Reactions  . Dilaudid (Hydromorphone Hcl) Other (See Comments)    Resp. arrest  . Morphine And Related Nausea And Vomiting  . Codeine Nausea And Vomiting and Rash    Medications Prior to Admission  Medication Sig Dispense Refill  . ibuprofen (ADVIL,MOTRIN) 200 MG tablet Take 200 mg by mouth every 6 (six) hours as needed. Pain.      . lubiprostone (AMITIZA) 24 MCG capsule Take 24 mcg by mouth 2 (two) times daily with a meal.        No results found for this or any previous visit (from the past 48 hour(s)). No results found.  ROS  Blood pressure 112/64, pulse 64, temperature 97.6 F (36.4 C), temperature source Oral, resp. rate 16, height 5\' 8"  (1.727 m), weight 163 lb (73.936 kg), SpO2 95.00%. Physical Exam  Constitutional: She appears well-developed and well-nourished.  HENT:  Mouth/Throat: Oropharynx is clear and moist.  Eyes: Conjunctivae normal are normal. No scleral icterus.  Neck: No  thyromegaly present.  Cardiovascular: Normal rate, regular rhythm and normal heart sounds.   No murmur heard. Respiratory: Effort normal and breath sounds normal.  GI: She exhibits no mass.  Musculoskeletal: She exhibits no edema.  Lymphadenopathy:    She has no cervical adenopathy.  Neurological: She is alert.  Skin: Skin is warm and dry.     Assessment/Plan Recurrent epigastric pain with nausea anorexia and weight loss. Diagnostic EGD.  REHMAN,NAJEEB U 09/10/2012, 7:37 AM

## 2012-09-14 ENCOUNTER — Encounter (HOSPITAL_COMMUNITY): Payer: Self-pay | Admitting: Internal Medicine

## 2012-09-17 ENCOUNTER — Ambulatory Visit (HOSPITAL_COMMUNITY)
Admission: RE | Admit: 2012-09-17 | Discharge: 2012-09-17 | Disposition: A | Discharge status: Home / Self Care | Payer: 59 | Source: Ambulatory Visit | Attending: Internal Medicine | Admitting: Internal Medicine

## 2012-09-17 ENCOUNTER — Telehealth (INDEPENDENT_AMBULATORY_CARE_PROVIDER_SITE_OTHER): Payer: Self-pay | Admitting: Internal Medicine

## 2012-09-17 DIAGNOSIS — R1013 Epigastric pain: Secondary | ICD-10-CM | POA: Insufficient documentation

## 2012-09-17 DIAGNOSIS — D1809 Hemangioma of other sites: Secondary | ICD-10-CM | POA: Insufficient documentation

## 2012-09-17 DIAGNOSIS — R630 Anorexia: Secondary | ICD-10-CM

## 2012-09-17 DIAGNOSIS — R634 Abnormal weight loss: Secondary | ICD-10-CM | POA: Insufficient documentation

## 2012-09-17 MED ORDER — IOHEXOL 300 MG/ML  SOLN
100.0000 mL | Freq: Once | INTRAMUSCULAR | Status: AC | PRN
  Administered 2012-09-17: 100 mL via INTRAVENOUS

## 2012-09-17 NOTE — Telephone Encounter (Signed)
Orders for CT abdomen/pelvis with CM

## 2012-09-17 NOTE — Telephone Encounter (Signed)
Creatinine ordered for Ct abdomen/pelvis

## 2012-09-19 DIAGNOSIS — I219 Acute myocardial infarction, unspecified: Secondary | ICD-10-CM

## 2012-09-19 HISTORY — DX: Acute myocardial infarction, unspecified: I21.9

## 2012-09-21 ENCOUNTER — Encounter (INDEPENDENT_AMBULATORY_CARE_PROVIDER_SITE_OTHER): Payer: Self-pay | Admitting: *Deleted

## 2013-01-16 ENCOUNTER — Emergency Department (HOSPITAL_COMMUNITY): Payer: 59

## 2013-01-16 ENCOUNTER — Encounter (HOSPITAL_COMMUNITY)
Admission: EM | Disposition: A | Discharge status: Home / Self Care | Payer: Self-pay | Source: Home / Self Care | Attending: Internal Medicine

## 2013-01-16 ENCOUNTER — Inpatient Hospital Stay (HOSPITAL_COMMUNITY)
Admission: EM | Admit: 2013-01-16 | Discharge: 2013-01-18 | DRG: 282 | Disposition: A | Discharge status: Home / Self Care | Payer: 59 | Attending: Internal Medicine | Admitting: Internal Medicine

## 2013-01-16 ENCOUNTER — Encounter (HOSPITAL_COMMUNITY): Payer: Self-pay

## 2013-01-16 ENCOUNTER — Inpatient Hospital Stay (HOSPITAL_COMMUNITY): Payer: 59

## 2013-01-16 DIAGNOSIS — Z79899 Other long term (current) drug therapy: Secondary | ICD-10-CM

## 2013-01-16 DIAGNOSIS — M797 Fibromyalgia: Secondary | ICD-10-CM | POA: Diagnosis present

## 2013-01-16 DIAGNOSIS — Z860100 Personal history of colon polyps, unspecified: Secondary | ICD-10-CM

## 2013-01-16 DIAGNOSIS — IMO0002 Reserved for concepts with insufficient information to code with codable children: Secondary | ICD-10-CM

## 2013-01-16 DIAGNOSIS — I2510 Atherosclerotic heart disease of native coronary artery without angina pectoris: Secondary | ICD-10-CM

## 2013-01-16 DIAGNOSIS — R7989 Other specified abnormal findings of blood chemistry: Secondary | ICD-10-CM

## 2013-01-16 DIAGNOSIS — I214 Non-ST elevation (NSTEMI) myocardial infarction: Principal | ICD-10-CM | POA: Diagnosis present

## 2013-01-16 DIAGNOSIS — R072 Precordial pain: Secondary | ICD-10-CM

## 2013-01-16 DIAGNOSIS — M6281 Muscle weakness (generalized): Secondary | ICD-10-CM

## 2013-01-16 DIAGNOSIS — K219 Gastro-esophageal reflux disease without esophagitis: Secondary | ICD-10-CM | POA: Diagnosis present

## 2013-01-16 DIAGNOSIS — R079 Chest pain, unspecified: Secondary | ICD-10-CM

## 2013-01-16 DIAGNOSIS — G43909 Migraine, unspecified, not intractable, without status migrainosus: Secondary | ICD-10-CM | POA: Diagnosis present

## 2013-01-16 HISTORY — PX: LEFT HEART CATHETERIZATION WITH CORONARY ANGIOGRAM: SHX5451

## 2013-01-16 HISTORY — DX: Atherosclerotic heart disease of native coronary artery without angina pectoris: I25.10

## 2013-01-16 LAB — BASIC METABOLIC PANEL
BUN: 18 mg/dL (ref 6–23)
CO2: 23 mEq/L (ref 19–32)
Calcium: 8.8 mg/dL (ref 8.4–10.5)
GFR calc non Af Amer: 89 mL/min — ABNORMAL LOW (ref >90)
Glucose, Bld: 173 mg/dL — ABNORMAL HIGH (ref 70–99)

## 2013-01-16 LAB — TSH: TSH: 2.09 u[IU]/mL (ref 0.350–4.500)

## 2013-01-16 LAB — PROTIME-INR: Prothrombin Time: 12.3 seconds (ref 11.6–15.2)

## 2013-01-16 LAB — CBC WITH DIFFERENTIAL/PLATELET
Eosinophils Absolute: 0.2 10*3/uL (ref 0.0–0.7)
Eosinophils Relative: 3 % (ref 0–5)
HCT: 43.9 % (ref 36.0–46.0)
Hemoglobin: 15.4 g/dL — ABNORMAL HIGH (ref 12.0–15.0)
Lymphs Abs: 2.3 10*3/uL (ref 0.7–4.0)
MCH: 30.9 pg (ref 26.0–34.0)
MCV: 88 fL (ref 78.0–100.0)
Monocytes Relative: 6 % (ref 3–12)
RBC: 4.99 MIL/uL (ref 3.87–5.11)

## 2013-01-16 LAB — TROPONIN I: Troponin I: 2.77 ng/mL

## 2013-01-16 LAB — CBC
Hemoglobin: 16.2 g/dL — ABNORMAL HIGH (ref 12.0–15.0)
MCHC: 35.2 g/dL (ref 30.0–36.0)
Platelets: 201 10*3/uL (ref 150–400)
RBC: 5.29 MIL/uL — ABNORMAL HIGH (ref 3.87–5.11)

## 2013-01-16 LAB — CREATININE, SERUM
Creatinine, Ser: 0.69 mg/dL (ref 0.50–1.10)
GFR calc non Af Amer: >90 mL/min (ref >90)

## 2013-01-16 SURGERY — LEFT HEART CATHETERIZATION WITH CORONARY ANGIOGRAM
Anesthesia: LOCAL

## 2013-01-16 MED ORDER — ONDANSETRON HCL 4 MG/2ML IJ SOLN
4.0000 mg | Freq: Four times a day (QID) | INTRAMUSCULAR | Status: DC | PRN
  Administered 2013-01-17: 4 mg via INTRAVENOUS
  Filled 2013-01-16 (×2): qty 2

## 2013-01-16 MED ORDER — IBUPROFEN 200 MG PO TABS
200.0000 mg | ORAL_TABLET | Freq: Four times a day (QID) | ORAL | Status: DC | PRN
  Administered 2013-01-18: 200 mg via ORAL
  Filled 2013-01-16 (×2): qty 1

## 2013-01-16 MED ORDER — HEPARIN BOLUS VIA INFUSION
4000.0000 [IU] | Freq: Once | INTRAVENOUS | Status: AC
  Administered 2013-01-16: 4000 [IU] via INTRAVENOUS
  Filled 2013-01-16: qty 4000

## 2013-01-16 MED ORDER — SODIUM CHLORIDE 0.9 % IJ SOLN: 3.0000 mL | INTRAMUSCULAR | Status: DC | PRN

## 2013-01-16 MED ORDER — CLOPIDOGREL BISULFATE 75 MG PO TABS
75.0000 mg | ORAL_TABLET | Freq: Every day | ORAL | Status: DC
  Administered 2013-01-16 – 2013-01-18 (×3): 75 mg via ORAL
  Filled 2013-01-16 (×5): qty 1

## 2013-01-16 MED ORDER — ACETAMINOPHEN 325 MG PO TABS
650.0000 mg | ORAL_TABLET | ORAL | Status: DC | PRN
  Administered 2013-01-16 – 2013-01-17 (×2): 650 mg via ORAL

## 2013-01-16 MED ORDER — MIDAZOLAM HCL 2 MG/2ML IJ SOLN
INTRAMUSCULAR | Status: AC
  Filled 2013-01-16: qty 2

## 2013-01-16 MED ORDER — SODIUM CHLORIDE 0.9 % IV SOLN: INTRAVENOUS | Status: AC

## 2013-01-16 MED ORDER — HEPARIN SODIUM (PORCINE) 1000 UNIT/ML IJ SOLN
INTRAMUSCULAR | Status: AC
  Filled 2013-01-16: qty 1

## 2013-01-16 MED ORDER — ASPIRIN EC 325 MG PO TBEC
325.0000 mg | DELAYED_RELEASE_TABLET | Freq: Every day | ORAL | Status: DC
  Filled 2013-01-16: qty 1

## 2013-01-16 MED ORDER — METOPROLOL TARTRATE 12.5 MG HALF TABLET
12.5000 mg | ORAL_TABLET | Freq: Two times a day (BID) | ORAL | Status: DC
  Administered 2013-01-16 – 2013-01-18 (×3): 12.5 mg via ORAL
  Filled 2013-01-16 (×5): qty 1

## 2013-01-16 MED ORDER — FENTANYL CITRATE 0.05 MG/ML IJ SOLN
INTRAMUSCULAR | Status: AC
  Filled 2013-01-16: qty 2

## 2013-01-16 MED ORDER — SODIUM CHLORIDE 0.9 % IJ SOLN
3.0000 mL | Freq: Two times a day (BID) | INTRAMUSCULAR | Status: DC
  Administered 2013-01-16 – 2013-01-17 (×2): 3 mL via INTRAVENOUS
  Administered 2013-01-18: 10:00:00 via INTRAVENOUS

## 2013-01-16 MED ORDER — ATORVASTATIN CALCIUM 80 MG PO TABS
80.0000 mg | ORAL_TABLET | Freq: Every day | ORAL | Status: DC
  Administered 2013-01-16 – 2013-01-17 (×2): 80 mg via ORAL
  Filled 2013-01-16 (×3): qty 1

## 2013-01-16 MED ORDER — ASPIRIN 81 MG PO CHEW
324.0000 mg | CHEWABLE_TABLET | Freq: Once | ORAL | Status: AC
  Administered 2013-01-16: 324 mg via ORAL
  Filled 2013-01-16: qty 4

## 2013-01-16 MED ORDER — PANTOPRAZOLE SODIUM 40 MG PO TBEC
40.0000 mg | DELAYED_RELEASE_TABLET | Freq: Every day | ORAL | Status: DC
  Administered 2013-01-16 – 2013-01-18 (×3): 40 mg via ORAL
  Filled 2013-01-16 (×3): qty 1

## 2013-01-16 MED ORDER — ASPIRIN 81 MG PO CHEW: 324.0000 mg | CHEWABLE_TABLET | ORAL | Status: DC

## 2013-01-16 MED ORDER — HEPARIN SODIUM (PORCINE) 5000 UNIT/ML IJ SOLN
5000.0000 [IU] | Freq: Three times a day (TID) | INTRAMUSCULAR | Status: DC
  Administered 2013-01-16 – 2013-01-18 (×5): 5000 [IU] via SUBCUTANEOUS
  Filled 2013-01-16 (×8): qty 1

## 2013-01-16 MED ORDER — NITROGLYCERIN 0.4 MG SL SUBL
0.4000 mg | SUBLINGUAL_TABLET | SUBLINGUAL | Status: AC | PRN
  Administered 2013-01-16 – 2013-01-17 (×4): 0.4 mg via SUBLINGUAL
  Filled 2013-01-16: qty 75
  Filled 2013-01-16 (×2): qty 25

## 2013-01-16 MED ORDER — VERAPAMIL HCL 2.5 MG/ML IV SOLN
INTRAVENOUS | Status: AC
  Filled 2013-01-16: qty 2

## 2013-01-16 MED ORDER — SODIUM CHLORIDE 0.9 % IV SOLN: 1.0000 mL/kg/h | INTRAVENOUS | Status: DC

## 2013-01-16 MED ORDER — ACETAMINOPHEN 325 MG PO TABS
650.0000 mg | ORAL_TABLET | ORAL | Status: DC | PRN
  Administered 2013-01-17 – 2013-01-18 (×2): 650 mg via ORAL
  Filled 2013-01-16 (×4): qty 2

## 2013-01-16 MED ORDER — SODIUM CHLORIDE 0.9 % IV SOLN: 250.0000 mL | INTRAVENOUS | Status: DC | PRN

## 2013-01-16 MED ORDER — SODIUM CHLORIDE 0.9 % IJ SOLN: 3.0000 mL | Freq: Two times a day (BID) | INTRAMUSCULAR | Status: DC

## 2013-01-16 MED ORDER — HEPARIN SODIUM (PORCINE) 5000 UNIT/ML IJ SOLN: 5000.0000 [IU] | Freq: Three times a day (TID) | INTRAMUSCULAR | Status: DC

## 2013-01-16 MED ORDER — ONDANSETRON HCL 4 MG/2ML IJ SOLN
4.0000 mg | Freq: Three times a day (TID) | INTRAMUSCULAR | Status: AC | PRN
  Administered 2013-01-16: 4 mg via INTRAVENOUS

## 2013-01-16 MED ORDER — LIDOCAINE HCL (PF) 1 % IJ SOLN
INTRAMUSCULAR | Status: AC
  Filled 2013-01-16: qty 30

## 2013-01-16 MED ORDER — HEPARIN (PORCINE) IN NACL 2-0.9 UNIT/ML-% IJ SOLN
INTRAMUSCULAR | Status: AC
  Filled 2013-01-16: qty 1000

## 2013-01-16 MED ORDER — IOHEXOL 350 MG/ML SOLN
100.0000 mL | Freq: Once | INTRAVENOUS | Status: AC | PRN
  Administered 2013-01-16: 100 mL via INTRAVENOUS

## 2013-01-16 MED ORDER — HEPARIN (PORCINE) IN NACL 100-0.45 UNIT/ML-% IJ SOLN
800.0000 [IU]/h | INTRAMUSCULAR | Status: DC
  Administered 2013-01-16: 800 [IU]/h via INTRAVENOUS
  Filled 2013-01-16: qty 250

## 2013-01-16 NOTE — H&P (View-Only) (Signed)
CARDIOLOGY CONSULT NOTE  Patient ID: Stacey Lawrence MRN: [id number] DOB/AGE: 03/15/1951 62 y.o.  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 The pain radiated to the left shoulder down the left arm with numbness and tingling. She went straight to the emergency room she was treated with nitroglycerin, and aspirin after chest discomfort had persisted for 90 minutes, resulting in resolution of her discomfort.  CT scan of the chest was negative for PE, CT of the abdomen was negative as well. I-stat was negative. Chest x-ray revealed underlying emphysema but no edema or consolidation. Initial cardiac enzyme on first set was found to be elevated at 2.77 with an EKG demonstrating T wave flattening in V2 with T-wave inversion noted in V4 with flattening in V5, heart rate 56 beats per minute with an incomplete right bundle branch block.  She is since been started on heparin after IV bolus, and is pain-free at this time.  She has been transferred to ICU.    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   Review of systems complete and found to be negative unless listed above   Past Medical History  Diagnosis Date  . PONV (postoperative nausea and vomiting)   . Fibromyalgia   . Headache     migraines and cluster;last migraine 3 mon ago  .  Joint pain   . Joint swelling   . Scoliosis   . GERD (gastroesophageal reflux disease)     hasn't started taking any meds  . IBS (irritable bowel syndrome)     constipation  . Hx of colonic polyps   . Kidney stones 03/2010  . History of blood transfusion     25yrs ago  . Polycythemia     Family History  Problem Relation Age of Onset  . Cancer Mother     Deceased with lung CA  . Cancer Father     Deceased with lung CA    History   Social History  . Marital Status: Married    Spouse Name: N/A    Number of Children: N/A  . Years of Education: N/A   Occupational History  . Not on file.   Social History Main Topics  . Smoking status: Never Smoker   . Smokeless tobacco: Never Used  . Alcohol Use: Yes     Comment: rarely  . Drug Use: No  . Sexually Active: Yes    Birth Control/ Protection: Surgical   Other Topics Concern  . Not on file   Social History Narrative  . No narrative on file    Past Surgical History  Procedure Laterality Date  . Abdominal hysterectomy      part. hyst.  . Tubal ligation    . Cholecystectomy  10/28/2002    lap.  . Debridement tennis elbow    . Elbow surgery      golfer's elbow-bilateral  .   Knee debridement      right  . Appendectomy      with explor. lap.  . Stapedectomy      right  . Trigger finger release  08/19/2011    Procedure: RELEASE TRIGGER FINGER/A-1 PULLEY;  Surgeon: William M Gramig III;  Location: [facility];  Service: Orthopedics;  Laterality: Right;  right middle finger a-1 pulley release with tenosynovectomy  . Tonsillectomy and adenoidectomy    . Diagnostic laparoscopy    . Dilation and curettage of uterus    . Tympanoplasty    . Colonoscopy    . I&d extremity  02/25/2012    Procedure: IRRIGATION AND DEBRIDEMENT EXTREMITY;  Surgeon: William Gramig, MD;  Location: [facility] OR;  Service: Orthopedics;  Laterality: Right;  Irrigation and Debridement and Repair Right Thumb Nerve Laceration and Assoiated Structures    . Laparotomy    . Esophagogastroduodenoscopy  09/10/2012    Procedure: ESOPHAGOGASTRODUODENOSCOPY (EGD);  Surgeon: Najeeb U Rehman, MD;  Location: [facility];  Service: Endoscopy;  Laterality: N/A;  730     Prescriptions prior to admission  Medication Sig Dispense Refill  . pantoprazole (PROTONIX) 40 MG tablet Take 1 tablet (40 mg total) by mouth daily.  30 tablet  2  . ibuprofen (ADVIL,MOTRIN) 200 MG tablet Take 200 mg by mouth every 6 (six) hours as needed. Pain.       Physical Exam: Blood pressure 123/66, pulse 62, temperature 97.8 F (36.6 C), temperature source Oral, resp. rate 18, height 5' 8" (1.727 m), weight 150 lb (68.04 kg), SpO2 98.00%.  General: Well developed, well nourished, in no acute distress Head: Eyes PERRLA, No xanthomas.   Normal cephalic and atramatic  Lungs: Clear bilaterally to auscultation and percussion. Heart: HRRR S1 S2, without MRG.  Pulses are 2+ & equal.            No carotid bruit. No JVD.  No abdominal bruits. No femoral bruits. Abdomen: Bowel sounds are positive, abdomen soft and non-tender without masses or                  Hernia's noted. Msk:  Back normal, normal gait. Normal strength and tone for age. Extremities: No clubbing, cyanosis or edema.  DP +1 Neuro: Alert and oriented X 3. Psych:  Good affect, responds appropriately  Echocardiogram: Small akinetic segment in the distal inferolateral wall; normal overall EF; no valvular abnormalities.  Labs:   Lab Results  Component Value Date   WBC 7.1 01/16/2013   HGB 15.4* 01/16/2013   HCT 43.9 01/16/2013   MCV 88.0 01/16/2013   PLT 188 01/16/2013     Recent Labs Lab 01/16/13 0719  NA 135  K 3.9  CL 102  CO2 23  BUN 18  CREATININE 0.79  CALCIUM 8.8  GLUCOSE 173*   Lab Results  Component Value Date   TROPONINI 2.77* 01/16/2013    Lipids: 170,?TG, 59, 99  Radiology:  Dg Chest  01/16/2013  Underlying emphysema.  No edema or consolidation.   Ct Angio Chest  01/16/2013   Unremarkable CTA of the chest, abdomen and pelvis.   EKG: Tracing performed in the ED not available for review. EKG of 01/16/13: Sinus bradycardia; first-degree AV block; left atrial abnormality; delayed R-wave progression; right ventricular conduction delay; no previous tracing for comparison.  ASSESSMENT AND PLAN:   1. NSTEMI: Patient with symptoms of left-sided chest pain, positive troponin of 2.77, with EKG changes.  She has been pain-free since nitroglycerin sublingual relieve   chest discomfort in the ED. Will most likely need to be transferred to [place] for cardiac catheterization. Pressure is stable at 123/66, creatinine of 0.79  Cardiac cath procedure, risks and benefits have been discussed with the patient. She is willing to be transferred for cardiac cath. Discussed case with Dr.Luca Burston.  2. History of Fibromyalgia: Recently treated with steroid Dosepak.  3. History of migraine  4. Hx of GERD  Kathryn M. Lawrence NP [facility] 01/16/2013, 12:13 PM  Cardiology Attending Patient interviewed and examined. Discussed with Kathryn Lawrence, NP.  Above note annotated and modified based upon my findings.  Patient presents with acute coronary syndrome, minor troponin elevation, no prominent EKG abnormalities and a small segmental wall motion abnormality.  Although mildly hyperglycemic at admission, she has no history of diabetes, hypertension or hyperlipidemia, nor do she use tobacco products. Although coronary artery disease is the underlying entity causing acute coronary syndrome is statistically most likely, this presentation could reflect an alternative mechanism such as her nares dissection, coronary embolization or spasm. Cardiac catheterization scheduled for today to further elucidate the nature magnitude of her cardiac disease.  Maziyah Vessel, MD 01/16/2013, 1:25 PM        

## 2013-01-16 NOTE — H&P (Signed)
[facility] History and Physical  Stacey Lawrence ZOX:[id number] DOB: 1951/05/04 DOA: 01/16/2013  Referring physician: ER physician. PCP: Dwana Melena, MD    Chief Complaint: Left chest pain.  HPI: Stacey Lawrence is a 62 y.o. female complains of left sided chest pain which started suddenly at 6:15 AM this morning on her way to work. It was a sharp pain which lasted at least 3 hours or so eventually. It has subsided to some degree with sublingual nitroglycerin. It radiated down the left arm making her left arm feel normal. In fact she also noticed that the left arm and left leg have become weaker. The pain was associated with sweating and dyspnea. There was no cough. Yesterday she had been moving some boxes, although not heavy in her house. She has not been doing any other heavy lifting to speak of. She has been evaluated in the emergency room and CT angiogram of the chest does not show evidence of pulmonary embolism or any of the aortic problem. CT brain scan is unremarkable. She does not particularly have coronary risk factors apart from being postmenopausal.   Review of Systems:  Apart from a symptoms above, other systems negative.  Past Medical History  Diagnosis Date  . PONV (postoperative nausea and vomiting)   . Fibromyalgia   . Headache     migraines and cluster;last migraine 3 mon ago  . Joint pain   . Joint swelling   . Scoliosis   . GERD (gastroesophageal reflux disease)     hasn't started taking any meds  . IBS (irritable bowel syndrome)     constipation  . Hx of colonic polyps   . Kidney stones 03/2010  . History of blood transfusion     74yrs ago  . Polycythemia    Past Surgical History  Procedure Laterality Date  . Abdominal hysterectomy      part. hyst.  . Tubal ligation    . Cholecystectomy  10/28/2002    lap.  . Debridement tennis elbow    . Elbow surgery      golfer's elbow-bilateral  . Knee debridement      right  . Appendectomy      with explor.  lap.  . Stapedectomy      right  . Trigger finger release  08/19/2011    Procedure: RELEASE TRIGGER FINGER/A-1 PULLEY;  Surgeon: Oletta Cohn III;  Location: [facility];  Service: Orthopedics;  Laterality: Right;  right middle finger a-1 pulley release with tenosynovectomy  . Tonsillectomy and adenoidectomy    . Diagnostic laparoscopy    . Dilation and curettage of uterus    . Tympanoplasty    . Colonoscopy    . I&d extremity  02/25/2012    Procedure: IRRIGATION AND DEBRIDEMENT EXTREMITY;  Surgeon: Dominica Severin, MD;  Location: [facility] OR;  Service: Orthopedics;  Laterality: Right;  Irrigation and Debridement and Repair Right Thumb Nerve Laceration and Assoiated Structures   . Laparotomy    . Esophagogastroduodenoscopy  09/10/2012    Procedure: ESOPHAGOGASTRODUODENOSCOPY (EGD);  Surgeon: Malissa Hippo, MD;  [facility];  Service: Endoscopy;  Laterality: N/A;  730   Social History:  She is married and lives with her husband. She does not smoke cigarettes. She does not drink alcohol on a regular basis. She works as a Magazine features editor at [employer].  Past Medical History  Diagnosis Date  . PONV (postoperative nausea and vomiting)   . Fibromyalgia   . Headache  migraines and cluster;last migraine 3 mon ago  . Joint pain   . Joint swelling   . Scoliosis   . GERD (gastroesophageal reflux disease)     hasn't started taking any meds  . IBS (irritable bowel syndrome)     constipation  . Hx of colonic polyps   . Kidney stones 03/2010  . History of blood transfusion     42yrs ago  . Polycythemia    Past Surgical History  Procedure Laterality Date  . Abdominal hysterectomy      part. hyst.  . Tubal ligation    . Cholecystectomy  10/28/2002    lap.  . Debridement tennis elbow    . Elbow surgery      golfer's elbow-bilateral  . Knee debridement      right  . Appendectomy      with explor. lap.  . Stapedectomy      right  . Trigger finger  release  08/19/2011    Procedure: RELEASE TRIGGER FINGER/A-1 PULLEY;  Surgeon: Oletta Cohn III;  Location: [facility];  Service: Orthopedics;  Laterality: Right;  right middle finger a-1 pulley release with tenosynovectomy  . Tonsillectomy and adenoidectomy    . Diagnostic laparoscopy    . Dilation and curettage of uterus    . Tympanoplasty    . Colonoscopy    . I&d extremity  02/25/2012    Procedure: IRRIGATION AND DEBRIDEMENT EXTREMITY;  Surgeon: Dominica Severin, MD;  Location: [facility] OR;  Service: Orthopedics;  Laterality: Right;  Irrigation and Debridement and Repair Right Thumb Nerve Laceration and Assoiated Structures   . Laparotomy    . Esophagogastroduodenoscopy  09/10/2012    Procedure: ESOPHAGOGASTRODUODENOSCOPY (EGD);  Surgeon: Malissa Hippo, MD;  [facility];  Service: Endoscopy;  Laterality: N/A;  730          Allergies:  Allergies  Allergen Reactions  . Dilaudid (Hydromorphone Hcl) Other (See Comments)    Resp. arrest  . Morphine And Related Nausea And Vomiting  . Codeine Nausea And Vomiting and Rash    Medications Prior to Admission  Medication Sig Dispense Refill  . pantoprazole (PROTONIX) 40 MG tablet Take 1 tablet (40 mg total) by mouth daily.  30 tablet  2  . ibuprofen (ADVIL,MOTRIN) 200 MG tablet Take 200 mg by mouth every 6 (six) hours as needed. Pain.          ?  Allergies  Allergen Reactions  . Dilaudid (Hydromorphone Hcl) Other (See Comments)    Resp. arrest  . Morphine And Related Nausea And Vomiting  . Codeine Nausea And Vomiting and Rash    No family history on file. noncontributory.   Prior to Admission medications   Medication Sig Start Date End Date Taking? Authorizing Provider  pantoprazole (PROTONIX) 40 MG tablet Take 1 tablet (40 mg total) by mouth daily. 09/10/12  Yes Malissa Hippo, MD  ibuprofen (ADVIL,MOTRIN) 200 MG tablet Take 200 mg by mouth every 6 (six) hours as needed. Pain.    Historical  Provider, MD   Physical Exam: Filed Vitals:   01/16/13 0654 01/16/13 0756 01/16/13 0845 01/16/13 0953  BP: 140/76 128/71 115/71 123/66  Pulse: 83 85 64 62  Temp: 97.5 F (36.4 C)   97.8 F (36.6 C)  TempSrc: Oral     Resp: 20 16 18 18   Height: 5\' 8"  (1.727 m)     Weight: 68.04 kg (150 lb)     SpO2: 100% 100% 100% 98%  General:  She looks systemically well. She does not appear to be in pain at the present time.  Eyes: No jaundice. No pallor.  ENT: No known allergies.  Neck: Tender in the left sternocleidomastoid. No lymphadenopathy.  Cardiovascular: Heart sounds are present and normal without murmurs or pericardial rubs.  Respiratory: Lung fields are clear. There is no pleural rub.  Abdomen: Soft, nontender. No masses. No hepatosplenomegaly.  Skin: No rash.  Musculoskeletal: Tender in the left anterior chest wall, reproducing her pain.  Psychiatric: Appropriate affect.  Neurologic: She appears to be weak in the left arm and left leg. There are no cranial nerve abnormalities. She is alert and orientated.  Labs on Admission:  Basic Metabolic Panel:  Recent Labs Lab 01/16/13 0719  NA 135  K 3.9  CL 102  CO2 23  GLUCOSE 173*  BUN 18  CREATININE 0.79  CALCIUM 8.8   Liver Function Tests: No results found for this basename: AST, ALT, ALKPHOS, BILITOT, PROT, ALBUMIN,  in the last 168 hours No results found for this basename: LIPASE, AMYLASE,  in the last 168 hours No results found for this basename: AMMONIA,  in the last 168 hours CBC:  Recent Labs Lab 01/16/13 0719  WBC 7.1  NEUTROABS 4.1  HGB 15.4*  HCT 43.9  MCV 88.0  PLT 188   Cardiac Enzymes: No results found for this basename: CKTOTAL, CKMB, CKMBINDEX, TROPONINI,  in the last 168 hours  BNP (last 3 results) No results found for this basename: PROBNP,  in the last 8760 hours CBG: No results found for this basename: GLUCAP,  in the last 168 hours  Radiological Exams on Admission: Ct Head  Wo Contrast  01/16/2013  *RADIOLOGY REPORT*  Clinical Data: Headache.  CT HEAD WITHOUT CONTRAST  Technique:  Contiguous axial images were obtained from the base of the skull through the vertex without contrast.  Comparison: 01/29/2008  Findings:  No acute intracranial abnormality.  Specifically, no hemorrhage, hydrocephalus, mass lesion, acute infarction, or significant intracranial injury.  No acute calvarial abnormality. Visualized paranasal sinuses and mastoids clear.  Orbital soft tissues unremarkable.  IMPRESSION: Normal study.   Original Report Authenticated By: Charlett Nose, M.D.    Dg Chest Portable 1 View  01/16/2013  *RADIOLOGY REPORT*  Clinical Data: Chest pain  PORTABLE CHEST - 1 VIEW  Comparison: May 11, 2010  Findings:  There is underlying emphysematous change.  There is no edema or consolidation.  The heart size is normal.  The pulmonary vascularity reflects underlying emphysema.  No adenopathy.  No bone lesions.  IMPRESSION: Underlying emphysema.  No edema or consolidation.   Original Report Authenticated By: Bretta Bang, M.D.    Ct Angio Chest Aorta W/cm &/or Wo/cm  01/16/2013  *RADIOLOGY REPORT*  Clinical Data:  Chest pain radiating into arm.  CT ANGIOGRAPHY CHEST, ABDOMEN AND PELVIS  Technique:  Multidetector CT imaging through the chest, abdomen and pelvis was performed using the standard protocol during bolus administration of intravenous contrast.  Multiplanar reconstructed images including MIPs were obtained and reviewed to evaluate the vascular anatomy.  Contrast: OMNIPAQUE IOHEXOL 350 MG/ML SOLN  Comparison:  CT of the abdomen and pelvis dated 09/17/2012  CTA CHEST  Findings:  The thoracic aorta is well opacified, as are the pulmonary arteries.  There is no evidence of aortic dissection or thoracic aortic aneurysmal disease.  Pulmonary arteries are normally patent.  Lung windows demonstrate scattered areas of parenchymal scarring.  There is no evidence of pulmonary  infiltrate,  edema, pneumothorax or nodule.  No pleural or pericardial fluid is seen.  The heart size is normal. The bony thorax demonstrates no focal lesions.  There is a minimal rightward convex scoliosis of the thoracic spine.   Review of the MIP images confirms the above findings.  IMPRESSION: Unremarkable CTA of the chest demonstrating no evidence of aortic pathology.  The pulmonary arteries are also well evaluated and there is no evidence of pulmonary embolism.  CTA ABDOMEN AND PELVIS  Findings:  The abdominal aorta is normally patent and shows no evidence of aneurysmal disease or dissection.  Major branch vessels including the celiac axis, superior mesenteric artery, bilateral single renal arteries and the inferior mesenteric arteries show normal patency.  Iliac and femoral arteries in the pelvis are within normal limits and normally patent.  Nonvascular evaluation during arterial phase imaging shows an unremarkable appearance to the solid organs including the liver, pancreas, spleen, adrenal glands and kidneys.  The gallbladder has been removed.  No biliary ductal dilatation is identified.  No masses or enlarged lymph nodes are seen.  Unopacified bowel loops are unremarkable.  No abnormal fluid collections.  The bladder appears normal.  The uterus appears to have been removed previously.  No hernias are identified.  Bony structures are unremarkable.   Review of the MIP images confirms the above findings.  IMPRESSION: Normal CTA of the abdomen and pelvis.   Original Report Authenticated By: Irish Lack, M.D.    Ct Cta Abd/pel W/cm &/or W/o Cm  01/16/2013  *RADIOLOGY REPORT*  Clinical Data:  Chest pain radiating into arm.  CT ANGIOGRAPHY CHEST, ABDOMEN AND PELVIS  Technique:  Multidetector CT imaging through the chest, abdomen and pelvis was performed using the standard protocol during bolus administration of intravenous contrast.  Multiplanar reconstructed images including MIPs were obtained and reviewed  to evaluate the vascular anatomy.  Contrast: OMNIPAQUE IOHEXOL 350 MG/ML SOLN  Comparison:  CT of the abdomen and pelvis dated 09/17/2012  CTA CHEST  Findings:  The thoracic aorta is well opacified, as are the pulmonary arteries.  There is no evidence of aortic dissection or thoracic aortic aneurysmal disease.  Pulmonary arteries are normally patent.  Lung windows demonstrate scattered areas of parenchymal scarring.  There is no evidence of pulmonary infiltrate, edema, pneumothorax or nodule.  No pleural or pericardial fluid is seen.  The heart size is normal. The bony thorax demonstrates no focal lesions.  There is a minimal rightward convex scoliosis of the thoracic spine.   Review of the MIP images confirms the above findings.  IMPRESSION: Unremarkable CTA of the chest demonstrating no evidence of aortic pathology.  The pulmonary arteries are also well evaluated and there is no evidence of pulmonary embolism.  CTA ABDOMEN AND PELVIS  Findings:  The abdominal aorta is normally patent and shows no evidence of aneurysmal disease or dissection.  Major branch vessels including the celiac axis, superior mesenteric artery, bilateral single renal arteries and the inferior mesenteric arteries show normal patency.  Iliac and femoral arteries in the pelvis are within normal limits and normally patent.  Nonvascular evaluation during arterial phase imaging shows an unremarkable appearance to the solid organs including the liver, pancreas, spleen, adrenal glands and kidneys.  The gallbladder has been removed.  No biliary ductal dilatation is identified.  No masses or enlarged lymph nodes are seen.  Unopacified bowel loops are unremarkable.  No abnormal fluid collections.  The bladder appears normal.  The uterus appears to have been removed previously.  No hernias are identified.  Bony structures are unremarkable.   Review of the MIP images confirms the above findings.  IMPRESSION: Normal CTA of the abdomen and pelvis.    Original Report Authenticated By: Irish Lack, M.D.     EKG: Independently reviewed. Normal sinus rhythm, no acute ST-T wave changes.  Assessment/Plan Active Problems:   Chest pain   Muscle weakness (generalized)   1. Left-sided chest pain, appears to be musculoskeletal at this point. 2. Left arm and left leg weakness, unclear etiology.  Plan: 1. Admit to telemetry. 2. Stable cardiac enzymes. 3. MRI of the cervical spine. 4. Neurology consultation. Further recommendations will depend on patient's hospital progress.   Code Status: Full code.  Family Communication: Discussed the plan with patient at the bedside.   Disposition Plan: Home when medically stable.   Time spent: 45 minutes.  Wilson Singer [facility] Pager [phone].  If 7PM-7AM, please contact night-coverage [email or web address] Password [facility] 01/16/2013, 10:42 AM

## 2013-01-16 NOTE — Consult Note (Signed)
CARDIOLOGY CONSULT NOTE  Patient ID: Stacey Lawrence MRN: [id number] DOB/AGE: Jan 30, 1951 62 y.o.  Admit date: 01/16/2013 Referring Physician:Gosrani Primary Dierdre Forth, MD Primary Cardiologist:(New) [place] Bing MD Reason for Consultation: Chest Pain with positive troponin and abnormal EKG  HPI: 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 The pain radiated to the left shoulder down the left arm with numbness and tingling. She went straight to the emergency room she was treated with nitroglycerin, and aspirin after chest discomfort had persisted for 90 minutes, resulting in resolution of her discomfort.  CT scan of the chest was negative for PE, CT of the abdomen was negative as well. I-stat was negative. Chest x-ray revealed underlying emphysema but no edema or consolidation. Initial cardiac enzyme on first set was found to be elevated at 2.77 with an EKG demonstrating T wave flattening in V2 with T-wave inversion noted in V4 with flattening in V5, heart rate 56 beats per minute with an incomplete right bundle branch block.  She is since been started on heparin after IV bolus, and is pain-free at this time.  She has been transferred to ICU.    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   Review of systems complete and found to be negative unless listed above   Past Medical History  Diagnosis Date  . PONV (postoperative nausea and vomiting)   . Fibromyalgia   . Headache     migraines and cluster;last migraine 3 mon ago  .  Joint pain   . Joint swelling   . Scoliosis   . GERD (gastroesophageal reflux disease)     hasn't started taking any meds  . IBS (irritable bowel syndrome)     constipation  . Hx of colonic polyps   . Kidney stones 03/2010  . History of blood transfusion     52yrs ago  . Polycythemia     Family History  Problem Relation Age of Onset  . Cancer Mother     Deceased with lung CA  . Cancer Father     Deceased with lung CA    History   Social History  . Marital Status: Married    Spouse Name: N/A    Number of Children: N/A  . Years of Education: N/A   Occupational History  . Not on file.   Social History Main Topics  . Smoking status: Never Smoker   . Smokeless tobacco: Never Used  . Alcohol Use: Yes     Comment: rarely  . Drug Use: No  . Sexually Active: Yes    Birth Control/ Protection: Surgical   Other Topics Concern  . Not on file   Social History Narrative  . No narrative on file    Past Surgical History  Procedure Laterality Date  . Abdominal hysterectomy      part. hyst.  . Tubal ligation    . Cholecystectomy  10/28/2002    lap.  . Debridement tennis elbow    . Elbow surgery      golfer's elbow-bilateral  .  Knee debridement      right  . Appendectomy      with explor. lap.  . Stapedectomy      right  . Trigger finger release  08/19/2011    Procedure: RELEASE TRIGGER FINGER/A-1 PULLEY;  Surgeon: Oletta Cohn III;  Location: [facility];  Service: Orthopedics;  Laterality: Right;  right middle finger a-1 pulley release with tenosynovectomy  . Tonsillectomy and adenoidectomy    . Diagnostic laparoscopy    . Dilation and curettage of uterus    . Tympanoplasty    . Colonoscopy    . I&d extremity  02/25/2012    Procedure: IRRIGATION AND DEBRIDEMENT EXTREMITY;  Surgeon: Dominica Severin, MD;  Location: [facility] OR;  Service: Orthopedics;  Laterality: Right;  Irrigation and Debridement and Repair Right Thumb Nerve Laceration and Assoiated Structures    . Laparotomy    . Esophagogastroduodenoscopy  09/10/2012    Procedure: ESOPHAGOGASTRODUODENOSCOPY (EGD);  Surgeon: Malissa Hippo, MD;  [facility];  Service: Endoscopy;  Laterality: N/A;  730     Prescriptions prior to admission  Medication Sig Dispense Refill  . pantoprazole (PROTONIX) 40 MG tablet Take 1 tablet (40 mg total) by mouth daily.  30 tablet  2  . ibuprofen (ADVIL,MOTRIN) 200 MG tablet Take 200 mg by mouth every 6 (six) hours as needed. Pain.       Physical Exam: Blood pressure 123/66, pulse 62, temperature 97.8 F (36.6 C), temperature source Oral, resp. rate 18, height 5\' 8"  (1.727 m), weight 150 lb (68.04 kg), SpO2 98.00%.  General: Well developed, well nourished, in no acute distress Head: Eyes PERRLA, No xanthomas.   Normal cephalic and atramatic  Lungs: Clear bilaterally to auscultation and percussion. Heart: HRRR S1 S2, without MRG.  Pulses are 2+ & equal.            No carotid bruit. No JVD.  No abdominal bruits. No femoral bruits. Abdomen: Bowel sounds are positive, abdomen soft and non-tender without masses or                  Hernia's noted. Msk:  Back normal, normal gait. Normal strength and tone for age. Extremities: No clubbing, cyanosis or edema.  DP +1 Neuro: Alert and oriented X 3. Psych:  Good affect, responds appropriately  Echocardiogram: Small akinetic segment in the distal inferolateral wall; normal overall EF; no valvular abnormalities.  Labs:   Lab Results  Component Value Date   WBC 7.1 01/16/2013   HGB 15.4* 01/16/2013   HCT 43.9 01/16/2013   MCV 88.0 01/16/2013   PLT 188 01/16/2013     Recent Labs Lab 01/16/13 0719  NA 135  K 3.9  CL 102  CO2 23  BUN 18  CREATININE 0.79  CALCIUM 8.8  GLUCOSE 173*   Lab Results  Component Value Date   TROPONINI 2.77* 01/16/2013    Lipids: 170,?TG, 59, 99  Radiology:  Dg Chest  01/16/2013  Underlying emphysema.  No edema or consolidation.   Ct Angio Chest  01/16/2013   Unremarkable CTA of the chest, abdomen and pelvis.   EKG: Tracing performed in the ED not available for review. EKG of 01/16/13: Sinus bradycardia; first-degree AV block; left atrial abnormality; delayed R-wave progression; right ventricular conduction delay; no previous tracing for comparison.  ASSESSMENT AND PLAN:   1. NSTEMI: Patient with symptoms of left-sided chest pain, positive troponin of 2.77, with EKG changes.  She has been pain-free since nitroglycerin sublingual relieve  chest discomfort in the ED. Will most likely need to be transferred to [facility] hospital for cardiac catheterization. Pressure is stable at 123/66, creatinine of 0.79  Cardiac cath procedure, risks and benefits have been discussed with the patient. She is willing to be transferred for cardiac cath. Discussed case with Dr.Sayla Golonka.  2. History of Fibromyalgia: Recently treated with steroid Dosepak.  3. History of migraine  4. Hx of GERD  Bettey Mare. Lyman Bishop NP [facility] 01/16/2013, 12:13 PM  Cardiology Attending Patient interviewed and examined. Discussed with Joni Reining, NP.  Above note annotated and modified based upon my findings.  Patient presents with acute coronary syndrome, minor troponin elevation, no prominent EKG abnormalities and a small segmental wall motion abnormality.  Although mildly hyperglycemic at admission, she has no history of diabetes, hypertension or hyperlipidemia, nor do she use tobacco products. Although coronary artery disease is the underlying entity causing acute coronary syndrome is statistically most likely, this presentation could reflect an alternative mechanism such as her nares dissection, coronary embolization or spasm. Cardiac catheterization scheduled for today to further elucidate the nature magnitude of her cardiac disease.  Butler Bing, MD 01/16/2013, 1:25 PM

## 2013-01-16 NOTE — ED Provider Notes (Signed)
History    This chart was scribed for Vida Roller, MD by Marlyne Beards, ED Scribe. The patient was seen in room APA02/APA02. Patient's care was started at 7:03 AM.    CSN: [id number]  Arrival date & time 01/16/13  7829   None     Chief Complaint  Patient presents with  . Chest Pain    (Consider location/radiation/quality/duration/timing/severity/associated sxs/prior treatment) The history is provided by the patient. No language interpreter was used.   Stacey Lawrence is a 62 y.o. female with h/o migraines who presents to the Emergency Department complaining of moderate constant chest pain onset earlier this morning. Pt states that she began having left center chest pain this morning on her way to work. She states the pain is tight and burning and reports that it radiates into her left arm. She complains that there is some numbness and tingling in her left arm. Pt denies fever, chills, cough, nausea, vomiting, diarrhea, SOB, weakness, and any other associated symptoms. Pt states that she was seen in the hospital for kidney stones in the past and was given Dilaudid. Shortly after pt when into respiratory arrest . Pt is also allergic to Morphine and Codeine. Pt denies having any medical hx, but does state that her parents both died of lung cancer. Pt's current PCP is Dr. Margo Aye. Pt currently has a reading of 149/85 in the ED.  62 year old female with no significant past medical history presents with a complaint of chest tightness which she states started just prior to arrival while she was driving to work. This was acute in onset, persistent, severe and associated with nausea and diaphoresis. She has no shortness of breath, no fevers, no cough, no swelling of her legs, no recent travel or immobilization, surgery or trauma. She denies headaches but does have a history of migraines, none of which presented this way. In addition to her chest pain she complains of an abnormal feeling in her left arm  with some numbness tingling and weakness.  She has no history of cardiac disease, does not take any medication for hypertension diabetes cholesterol and has never been a smoker. She has no family history of heart disease either. She does report lifting heavy items in her attic yesterday but does not remember injuring her chest or pulling a muscle.  Past Medical History  Diagnosis Date  . PONV (postoperative nausea and vomiting)   . Fibromyalgia   . Headache     migraines and cluster;last migraine 3 mon ago  . Joint pain   . Joint swelling   . Scoliosis   . GERD (gastroesophageal reflux disease)     hasn't started taking any meds  . IBS (irritable bowel syndrome)     constipation  . Hx of colonic polyps   . Kidney stones 03/2010  . History of blood transfusion     73yrs ago  . Polycythemia     Past Surgical History  Procedure Laterality Date  . Abdominal hysterectomy      part. hyst.  . Tubal ligation    . Cholecystectomy  10/28/2002    lap.  . Debridement tennis elbow    . Elbow surgery      golfer's elbow-bilateral  . Knee debridement      right  . Appendectomy      with explor. lap.  . Stapedectomy      right  . Trigger finger release  08/19/2011    Procedure: RELEASE TRIGGER FINGER/A-1 PULLEY;  Surgeon:  Oletta Cohn III;  Location: [facility];  Service: Orthopedics;  Laterality: Right;  right middle finger a-1 pulley release with tenosynovectomy  . Tonsillectomy and adenoidectomy    . Diagnostic laparoscopy    . Dilation and curettage of uterus    . Tympanoplasty    . Colonoscopy    . I&d extremity  02/25/2012    Procedure: IRRIGATION AND DEBRIDEMENT EXTREMITY;  Surgeon: Dominica Severin, MD;  Location: [facility] OR;  Service: Orthopedics;  Laterality: Right;  Irrigation and Debridement and Repair Right Thumb Nerve Laceration and Assoiated Structures   . Laparotomy    . Esophagogastroduodenoscopy  09/10/2012    Procedure: ESOPHAGOGASTRODUODENOSCOPY (EGD);   Surgeon: Malissa Hippo, MD;  Location: [facility];  Service: Endoscopy;  Laterality: N/A;  730    No family history on file.  History  Substance Use Topics  . Smoking status: Never Smoker   . Smokeless tobacco: Never Used  . Alcohol Use: Yes     Comment: rarely    OB History   Grav Para Term Preterm Abortions TAB SAB Ect Mult Living                  Review of Systems  Respiratory: Positive for chest tightness.   Cardiovascular: Positive for chest pain.  Neurological: Positive for weakness and numbness.  All other systems reviewed and are negative.    Allergies  Dilaudid; Morphine and related; and Codeine  Home Medications   Current Outpatient Rx  Name  Route  Sig  Dispense  Refill  . pantoprazole (PROTONIX) 40 MG tablet   Oral   Take 1 tablet (40 mg total) by mouth daily.   30 tablet   2   . ibuprofen (ADVIL,MOTRIN) 200 MG tablet   Oral   Take 200 mg by mouth every 6 (six) hours as needed. Pain.           BP 140/76  Pulse 83  Temp(Src) 97.5 F (36.4 C) (Oral)  Resp 20  Ht 5\' 8"  (1.727 m)  Wt 150 lb (68.04 kg)  BMI 22.81 kg/m2  SpO2 100%  Physical Exam  Nursing note and vitals reviewed. Constitutional: She is oriented to person, place, and time. She appears well-developed and well-nourished.  Uncomfortable appearing  HENT:  Head: Normocephalic and atraumatic.  Right Ear: External ear normal.  Left Ear: External ear normal.  Nose: Nose normal.  Mouth/Throat: Oropharynx is clear and moist.  Eyes: Conjunctivae and EOM are normal. Pupils are equal, round, and reactive to light.  Neck: Neck supple. No tracheal deviation present.  Cardiovascular: Normal rate.   No murmurs rubs or gallops, no JVD, normal pulses at the radial arteries  Pulmonary/Chest: Effort normal. No respiratory distress. She exhibits tenderness ( Mild chest tenderness over the left sternal border).  Abdominal: Soft. Bowel sounds are normal.  Musculoskeletal: Normal range of  motion.  Neurological: She is alert and oriented to person, place, and time.  Left upper extremity and left lower extremity with some giveaway weakness, slight decreased sensation to the left upper and left lower extremities.  Cranial nerves III through XII intact though she has some difficulty with shoulder shrug on the left  Skin: Skin is warm and dry.  Psychiatric: She has a normal mood and affect. Her behavior is normal.    ED Course  Procedures (including critical care time) DIAGNOSTIC STUDIES: Oxygen Saturation is 100% on room air, normal by my interpretation.    COORDINATION OF CARE: 7:12  AM Discussed ED treatment with pt and pt agrees.     Labs Reviewed  CBC WITH DIFFERENTIAL - Abnormal; Notable for the following:    Hemoglobin 15.4 (*)    All other components within normal limits  BASIC METABOLIC PANEL - Abnormal; Notable for the following:    Glucose, Bld 173 (*)    GFR calc non Af Amer 89 (*)    All other components within normal limits  POCT I-STAT TROPONIN I   Ct Head Wo Contrast  01/16/2013  *RADIOLOGY REPORT*  Clinical Data: Headache.  CT HEAD WITHOUT CONTRAST  Technique:  Contiguous axial images were obtained from the base of the skull through the vertex without contrast.  Comparison: 01/29/2008  Findings:  No acute intracranial abnormality.  Specifically, no hemorrhage, hydrocephalus, mass lesion, acute infarction, or significant intracranial injury.  No acute calvarial abnormality. Visualized paranasal sinuses and mastoids clear.  Orbital soft tissues unremarkable.  IMPRESSION: Normal study.   Original Report Authenticated By: Charlett Nose, M.D.    Dg Chest Portable 1 View  01/16/2013  *RADIOLOGY REPORT*  Clinical Data: Chest pain  PORTABLE CHEST - 1 VIEW  Comparison: May 11, 2010  Findings:  There is underlying emphysematous change.  There is no edema or consolidation.  The heart size is normal.  The pulmonary vascularity reflects underlying emphysema.  No  adenopathy.  No bone lesions.  IMPRESSION: Underlying emphysema.  No edema or consolidation.   Original Report Authenticated By: Bretta Bang, M.D.    Ct Angio Chest Aorta W/cm &/or Wo/cm  01/16/2013  *RADIOLOGY REPORT*  Clinical Data:  Chest pain radiating into arm.  CT ANGIOGRAPHY CHEST, ABDOMEN AND PELVIS  Technique:  Multidetector CT imaging through the chest, abdomen and pelvis was performed using the standard protocol during bolus administration of intravenous contrast.  Multiplanar reconstructed images including MIPs were obtained and reviewed to evaluate the vascular anatomy.  Contrast: OMNIPAQUE IOHEXOL 350 MG/ML SOLN  Comparison:  CT of the abdomen and pelvis dated 09/17/2012  CTA CHEST  Findings:  The thoracic aorta is well opacified, as are the pulmonary arteries.  There is no evidence of aortic dissection or thoracic aortic aneurysmal disease.  Pulmonary arteries are normally patent.  Lung windows demonstrate scattered areas of parenchymal scarring.  There is no evidence of pulmonary infiltrate, edema, pneumothorax or nodule.  No pleural or pericardial fluid is seen.  The heart size is normal. The bony thorax demonstrates no focal lesions.  There is a minimal rightward convex scoliosis of the thoracic spine.   Review of the MIP images confirms the above findings.  IMPRESSION: Unremarkable CTA of the chest demonstrating no evidence of aortic pathology.  The pulmonary arteries are also well evaluated and there is no evidence of pulmonary embolism.  CTA ABDOMEN AND PELVIS  Findings:  The abdominal aorta is normally patent and shows no evidence of aneurysmal disease or dissection.  Major branch vessels including the celiac axis, superior mesenteric artery, bilateral single renal arteries and the inferior mesenteric arteries show normal patency.  Iliac and femoral arteries in the pelvis are within normal limits and normally patent.  Nonvascular evaluation during arterial phase imaging shows an  unremarkable appearance to the solid organs including the liver, pancreas, spleen, adrenal glands and kidneys.  The gallbladder has been removed.  No biliary ductal dilatation is identified.  No masses or enlarged lymph nodes are seen.  Unopacified bowel loops are unremarkable.  No abnormal fluid collections.  The bladder appears normal.  The  uterus appears to have been removed previously.  No hernias are identified.  Bony structures are unremarkable.   Review of the MIP images confirms the above findings.  IMPRESSION: Normal CTA of the abdomen and pelvis.   Original Report Authenticated By: Irish Lack, M.D.    Ct Cta Abd/pel W/cm &/or W/o Cm  01/16/2013  *RADIOLOGY REPORT*  Clinical Data:  Chest pain radiating into arm.  CT ANGIOGRAPHY CHEST, ABDOMEN AND PELVIS  Technique:  Multidetector CT imaging through the chest, abdomen and pelvis was performed using the standard protocol during bolus administration of intravenous contrast.  Multiplanar reconstructed images including MIPs were obtained and reviewed to evaluate the vascular anatomy.  Contrast: OMNIPAQUE IOHEXOL 350 MG/ML SOLN  Comparison:  CT of the abdomen and pelvis dated 09/17/2012  CTA CHEST  Findings:  The thoracic aorta is well opacified, as are the pulmonary arteries.  There is no evidence of aortic dissection or thoracic aortic aneurysmal disease.  Pulmonary arteries are normally patent.  Lung windows demonstrate scattered areas of parenchymal scarring.  There is no evidence of pulmonary infiltrate, edema, pneumothorax or nodule.  No pleural or pericardial fluid is seen.  The heart size is normal. The bony thorax demonstrates no focal lesions.  There is a minimal rightward convex scoliosis of the thoracic spine.   Review of the MIP images confirms the above findings.  IMPRESSION: Unremarkable CTA of the chest demonstrating no evidence of aortic pathology.  The pulmonary arteries are also well evaluated and there is no evidence of pulmonary  embolism.  CTA ABDOMEN AND PELVIS  Findings:  The abdominal aorta is normally patent and shows no evidence of aneurysmal disease or dissection.  Major branch vessels including the celiac axis, superior mesenteric artery, bilateral single renal arteries and the inferior mesenteric arteries show normal patency.  Iliac and femoral arteries in the pelvis are within normal limits and normally patent.  Nonvascular evaluation during arterial phase imaging shows an unremarkable appearance to the solid organs including the liver, pancreas, spleen, adrenal glands and kidneys.  The gallbladder has been removed.  No biliary ductal dilatation is identified.  No masses or enlarged lymph nodes are seen.  Unopacified bowel loops are unremarkable.  No abnormal fluid collections.  The bladder appears normal.  The uterus appears to have been removed previously.  No hernias are identified.  Bony structures are unremarkable.   Review of the MIP images confirms the above findings.  IMPRESSION: Normal CTA of the abdomen and pelvis.   Original Report Authenticated By: Irish Lack, M.D.      1. Chest pain       MDM  The patient has no risk factors for heart disease, her blood pressure is 149 over 80, no tachycardia, no fever, no hypoxia. Her EKG is normal, no signs of acute coronary syndrome, no signs of right heart strain and no tachycardia. She has no risk factors for pulmonary embolism, no risk factors for acute coronary syndrome but does have chest pain with left upper and left lower extremity symptoms. Focal neurologic deficits with chest pain mandated rule out for aortic dissection. Anticoagulation held until CT scan performed. The patient does have a history of migraines though she does not complain of headache at this time thus would be difficult to correlate this with migraine. She also has some acid reflux in the past but states this is different than her indigestion.  ED ECG REPORT  I personally interpreted this  EKG   Date: 01/16/2013  Rate: 86  Rhythm: normal sinus rhythm  QRS Axis: normal  Intervals: normal  ST/T Wave abnormalities: normal  Conduction Disutrbances:none  Narrative Interpretation:   Old EKG Reviewed: none available   Pt has no findings to suggest aortic dissection or pulmonary embolism on the CT angiogram of the chest abdomen and pelvis. Her CT scan of the head revealed no acute infarct. The patient is having ongoing symptoms which have slightly improved with nitroglycerin and the she will be admitted to the hospital for further evaluation of her chest pain. I have discussed her care with the hospitalist who agrees.  Labs do not reveal any significant abnormalities including troponin, basic metabolic panel or CBC. Aspirin has been given in the emergency department  I personally performed the services described in this documentation, which was scribed in my presence. The recorded information has been reviewed and is accurate.           Vida Roller, MD 01/16/13 [phone]

## 2013-01-16 NOTE — ED Notes (Signed)
Pt returned fron ct scan, iv rechecked per jennifer request in ct, no signs of infiltration, fluids infusing. Pt denies any pain to the area.   She does c/o pain to the chest wall area. Sl ntg given.

## 2013-01-16 NOTE — ED Notes (Signed)
Pt ambulated to bathroom, voices not complaints.

## 2013-01-16 NOTE — Progress Notes (Signed)
ANTICOAGULATION CONSULT NOTE - Initial Consult  Pharmacy Consult for Heparin Indication: chest pain/ACS  Allergies  Allergen Reactions  . Dilaudid (Hydromorphone Hcl) Other (See Comments)    Resp. arrest  . Morphine And Related Nausea And Vomiting  . Codeine Nausea And Vomiting and Rash   Patient Measurements: Height: 5\' 8"  (172.7 cm) Weight: 150 lb (68.04 kg) IBW/kg (Calculated) : 63.9  Vital Signs: Temp: 97.8 F (36.6 C) (04/30 0953) Temp src: Oral (04/30 0654) BP: 123/66 mmHg (04/30 0953) Pulse Rate: 62 (04/30 0953)  Labs:  Recent Labs  01/16/13 0719 01/16/13 1046  HGB 15.4*  --   HCT 43.9  --   PLT 188  --   CREATININE 0.79  --   TROPONINI  --  2.77*   Estimated Creatinine Clearance: 75.4 ml/min (by C-G formula based on Cr of 0.79).  Medical History: Past Medical History  Diagnosis Date  . PONV (postoperative nausea and vomiting)   . Fibromyalgia   . Headache     migraines and cluster;last migraine 3 mon ago  . Joint pain   . Joint swelling   . Scoliosis   . GERD (gastroesophageal reflux disease)     hasn't started taking any meds  . IBS (irritable bowel syndrome)     constipation  . Hx of colonic polyps   . Kidney stones 03/2010  . History of blood transfusion     41yrs ago  . Polycythemia    Medications:  Infusions:  . heparin      Assessment: 62yo female c/o left sided chest pain that radiated down the left arm. Goal of Therapy:  Heparin level 0.3-0.7 units/ml Monitor platelets by anticoagulation protocol: Yes   Plan:  Heparin 4000 unit bolus x 1 now Heparin infusion at 12 units/Kg/hr Check heparin level in 6-8 hours then daily while on heparin CBC daily while on Heparin  Margo Aye, Earline Stiner A 01/16/2013,11:54 AM

## 2013-01-16 NOTE — Progress Notes (Signed)
Called and gave report to receiving nurse getting pt going to 2916. Family is at bedside and aware of transfer. Personal belongings were given to spouse. Pt transferred via [facility].

## 2013-01-16 NOTE — Progress Notes (Signed)
Report called to Sherron in Stepdown/ICU.

## 2013-01-16 NOTE — Progress Notes (Signed)
*  PRELIMINARY RESULTS* Echocardiogram 2D Echocardiogram has been performed.  Stacey Lawrence 01/16/2013, 12:28 PM

## 2013-01-16 NOTE — Care Management Note (Signed)
    Page 1 of 1   01/16/2013     3:28:50 PM   CARE MANAGEMENT NOTE 01/16/2013  Patient:  Stacey Lawrence, Stacey Lawrence   Account Number:  [id number]  Date Initiated:  01/16/2013  Documentation initiated by:  Stacey Lawrence  Subjective/Objective Assessment:   adm w mi     Action/Plan:   lives w husband, pcp dr zack hall   Anticipated DC Date:     Anticipated DC Plan:        DC Planning Services  CM consult      Choice offered to / List presented to:             Status of service:   Medicare Important Message given?   (If response is "NO", the following Medicare IM given date fields will be blank) Date Medicare IM given:   Date Additional Medicare IM given:    Discharge Disposition:    Per UR Regulation:  Reviewed for med. necessity/level of care/duration of stay  If discussed at Long Length of Stay Meetings, dates discussed:    Comments:  4/30 1527 Stacey Gemma Ruan rn,bsn

## 2013-01-16 NOTE — CV Procedure (Signed)
Cardiac Cath Procedure Note:  Indication: NSTEMI  Procedures performed:  1) Selective coronary angiography 2) Left heart catheterization 3) Left ventriculogram  Description of procedure:   The risks and indication of the procedure were explained. Consent was signed and placed on the chart. An appropriate timeout was taken prior to the procedure. After a normal Allen's test was confirmed, the right wrist was prepped and draped in the routine sterile fashion and anesthetized with 1% local lidocaine.   A 5 FR arterial sheath was then placed in the right radial artery using a modified Seldinger technique. Systemic heparin was administered. 3mg  IV verapamil was given through the sheath. Standard catheters including a JL 3.5, JR4 and straight pigtail were used. All catheter exchanges were made over a wire.  Complications:  None apparent  Total contrast: 90 cc  Findings:  Ao Pressure: 129/78 (102) LV Pressure:  126/2/8 There was no signficant gradient across the aortic valve on pullback.  Left main: Anomalous. Arising in the right cusp anterior to RCA origin. Long. No stenosis. Gives off LAD, Ramus and LCX.   LAD:  Gives off 2 diagonals. There is an intramyocardial segment (bridge) in the mid LAD with 30% plaque. Otherwise normal.  RAMUS: Normal  LCX:  Gives off small OM-1, OM-2 and OM-3. OM-2 is a very small caliber vessel which extends out to the distal anterolateral wall and apex. In the middle to distal OM-2 the vessel is subtotally occluded with thrombus  RCA: Large dominant vessel gives off large PDA and PL.  LV-gram done in the RAO projection: Ejection fraction = 55% focal area of hypokinesis in mid to distal anterolateral wall  Assessment: 1. CAD with occlusion of a small OM-2 2. 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 EF 55% with focal area of hypokinesis in mid to distal anterolateral wall  Plan/Discussion:  The OM-2 is too small  for PCI. Will treat medically with aggressive RF modification. Start Plavix.  Stacey Lawrence 6:41 PM

## 2013-01-16 NOTE — ED Notes (Signed)
Ice chips given to pt.  

## 2013-01-16 NOTE — Interval H&P Note (Signed)
History and Physical Interval Note:  01/16/2013 5:56 PM  Stacey Lawrence  has presented today for surgery, with the diagnosis of NSTEMI  The various methods of treatment have been discussed with the patient and family. After consideration of risks, benefits and other options for treatment, the patient has consented to  Procedure(s): LEFT HEART CATHETERIZATION WITH CORONARY ANGIOGRAM (N/A) as a surgical intervention .  The patient's history has been reviewed, patient examined, no change in status, stable for surgery.  I have reviewed the patient's chart and labs.  Questions were answered to the patient's satisfaction.     Nikolaus Pienta

## 2013-01-16 NOTE — ED Notes (Signed)
Pt reports driving to work this am and began having severe cp that radiates to left arm, +nausea, no vomiting, sob a times.

## 2013-01-16 NOTE — ED Notes (Signed)
Pt now stating that she thinks that NTG did help her pain and "eased it some", took a while to dissolve completely because she had a dry mouth. No pain at present, chest "feels heavy", does not want o2 at present, "my breathing is ok", husband remains at bedside.

## 2013-01-16 NOTE — Progress Notes (Signed)
CRITICAL VALUE ALERT  Critical value received:  Troponin 2.77 1137  Date of notification:  [id number]  Time of notification:  1137  Critical value read back:yes  Nurse who received alert:  Fara Chute, RN  MD notified (1st page):  Dr. Karilyn Cota   Time of first page:  1137  MD notified (2nd page):  Time of second page:  Responding MD:  Dr. Karilyn Cota  Time MD responded:  1140  Dr. Karilyn Cota gave orders for patient to receive stat ECG, Cardiology consult stat and move patient to stepdown.

## 2013-01-16 NOTE — ED Notes (Signed)
Pt stated that NTG did not help her pain, no c/o headache. Husband at bedside.

## 2013-01-17 DIAGNOSIS — I214 Non-ST elevation (NSTEMI) myocardial infarction: Secondary | ICD-10-CM

## 2013-01-17 LAB — CBC
Hemoglobin: 15.5 g/dL — ABNORMAL HIGH (ref 12.0–15.0)
MCH: 30 pg (ref 26.0–34.0)
MCV: 87.6 fL (ref 78.0–100.0)
Platelets: 208 10*3/uL (ref 150–400)
RBC: 5.17 MIL/uL — ABNORMAL HIGH (ref 3.87–5.11)
WBC: 7 10*3/uL (ref 4.0–10.5)

## 2013-01-17 LAB — TROPONIN I: Troponin I: 8.43 ng/mL (ref <0.30)

## 2013-01-17 MED ORDER — ASPIRIN EC 81 MG PO TBEC
81.0000 mg | DELAYED_RELEASE_TABLET | Freq: Every day | ORAL | Status: DC
  Administered 2013-01-17 – 2013-01-18 (×2): 81 mg via ORAL
  Filled 2013-01-17 (×2): qty 1

## 2013-01-17 MED ORDER — PROMETHAZINE HCL 25 MG/ML IJ SOLN
12.5000 mg | Freq: Four times a day (QID) | INTRAMUSCULAR | Status: DC | PRN
  Administered 2013-01-17: 12.5 mg via INTRAVENOUS
  Filled 2013-01-17: qty 1

## 2013-01-17 MED ORDER — PROMETHAZINE HCL 25 MG PO TABS
25.0000 mg | ORAL_TABLET | Freq: Four times a day (QID) | ORAL | Status: DC | PRN
  Administered 2013-01-17 – 2013-01-18 (×3): 25 mg via ORAL
  Filled 2013-01-17 (×3): qty 1

## 2013-01-17 NOTE — Progress Notes (Signed)
  Reviewed cath films today with Dr. Excell Seltzer who feels OM lesion and NSTEMI related to SCAD (spontaneous coronary artery dissection). Continue current therapy.   Truman Hayward 5:35 PM

## 2013-01-17 NOTE — Progress Notes (Signed)
Brief Nutrition Note:   RD pulled to pt for malnutrition screening tool report of unintentional weight loss. Pt s/p cardiac cath, with a headache and unable to talk at this time. Spoke with family in room who states pt has been cutting back on the amount she eats to help with GI distress. Pt is being followed by GI for recent stomach pain.  Wt Readings from Last 5 Encounters:  01/16/13 148 lb 5.9 oz (67.3 kg)  01/16/13 148 lb 5.9 oz (67.3 kg)  09/10/12 163 lb (73.936 kg)  09/10/12 163 lb (73.936 kg)  02/24/12 163 lb (73.936 kg)   Body mass index is 22.56 kg/(m^2). WNL  Chart reviewed, no nutrition interventions warranted at this time. Please consult as needed.    Clarene Duke RD, LDN Pager [phone] After Hours pager [phone]

## 2013-01-17 NOTE — Progress Notes (Signed)
CARDIAC REHAB PHASE I   PRE:  Rate/Rhythm: 78 SR  BP:  Supine: 107/65  Sitting:   Standing:    SaO2:   MODE:  Ambulation: 350 ft   POST:  Rate/Rhythm: 88 SR  BP:  Supine:   Sitting: 120/59  Standing:    SaO2:  1330-1420 On arrival pt continues with c/o of migraine  Headache. She is willing to ambulate. Gait steady, slow pace. Pt denies any cp or SOB. VS stable. Pt back to bed after walk with call light in reach. Started MI education with pt. She voices understanding. Discussed Outpt. CRP with pt, she agrees to referral to [facility]. Will follow pt tomorrow to continue education.  Melina Copa RN 01/17/2013 2:10 PM

## 2013-01-17 NOTE — Progress Notes (Signed)
   Subjective:  The patient had chest discomfort twice during the night which was relieved by sublingual nitroglycerin.  Today she is experiencing a migraine headache.  Normally she will take a Phenergan and go to sleep and when she wakes up the headache will be gone.  Objective:  Vital Signs in the last 24 hours: Temp:  [97.8 F (36.6 C)-99 F (37.2 C)] 98.1 F (36.7 C) (05/01 0700) Pulse Rate:  [62-90] 72 (05/01 0700) Resp:  [11-19] 15 (05/01 0700) BP: (99-140)/(54-73) 105/68 mmHg (05/01 0700) SpO2:  [97 %-100 %] 99 % (05/01 0700) Weight:  [148 lb 5.9 oz (67.3 kg)] 148 lb 5.9 oz (67.3 kg) (04/30 1510)  Intake/Output from previous day: 04/30 0701 - 05/01 0700 In: 1784.9 [P.O.:1080; I.V.:704.9] Out: -  Intake/Output from this shift:    . aspirin EC  81 mg Oral Daily  . atorvastatin  80 mg Oral q1800  . clopidogrel  75 mg Oral Q breakfast  . heparin  5,000 Units Subcutaneous Q8H  . metoprolol tartrate  12.5 mg Oral BID  . pantoprazole  40 mg Oral Daily  . sodium chloride  3 mL Intravenous Q12H      Physical Exam: The patient appears to be in no distress.  Head and neck exam reveals that the pupils are equal and reactive.  The extraocular movements are full.  There is no scleral icterus.  Mouth and pharynx are benign.  No lymphadenopathy.  No carotid bruits.  The jugular venous pressure is normal.  Thyroid is not enlarged or tender.  Chest is clear to percussion and auscultation.  No rales or rhonchi.  Expansion of the chest is symmetrical.  Heart reveals no abnormal lift or heave.  First and second heart sounds are normal.  There is no murmur gallop rub or click.  The abdomen is soft and nontender.  Bowel sounds are normoactive.  There is no hepatosplenomegaly or mass.  There are no abdominal bruits.  Extremities reveal no phlebitis or edema.  Pedal pulses are good.  There is no cyanosis or clubbing.  Neurologic exam is normal strength and no lateralizing weakness.  No  sensory deficits.  Integument reveals no rash  Lab Results:  Recent Labs  01/16/13 1956 01/17/13 0450  WBC 9.0 7.0  HGB 16.2* 15.5*  PLT 201 208    Recent Labs  01/16/13 0719 01/16/13 1956  NA 135  --   K 3.9  --   CL 102  --   CO2 23  --   GLUCOSE 173*  --   BUN 18  --   CREATININE 0.79 0.69    Recent Labs  01/16/13 1046  TROPONINI 2.77*   Hepatic Function Panel No results found for this basename: PROT, ALBUMIN, AST, ALT, ALKPHOS, BILITOT, BILIDIR, IBILI,  in the last 72 hours No results found for this basename: CHOL,  in the last 72 hours No results found for this basename: PROTIME,  in the last 72 hours  Imaging: Imaging results have been reviewed  Cardiac Studies:  Telemetry shows normal sinus rhythm Assessment/Plan:  1. NSTEMI: Doing well after cardiac catheterization.  Medical treatment indicated. 2. History of Fibromyalgia: Recently treated with steroid Dosepak.  3. History of migraine we will treat with Phenergan.  4. Hx of GERD  Plan: Cardiac rehabilitation to see.  Ambulate in the hall today.  Anticipate home tomorrow on dual antiplatelet therapy.  LOS: 1 day    Cassell Clement 01/17/2013, 8:11 AM

## 2013-01-18 ENCOUNTER — Encounter (HOSPITAL_COMMUNITY): Payer: Self-pay | Admitting: Nurse Practitioner

## 2013-01-18 ENCOUNTER — Telehealth: Payer: Self-pay | Admitting: Adult Health

## 2013-01-18 DIAGNOSIS — M797 Fibromyalgia: Secondary | ICD-10-CM | POA: Diagnosis present

## 2013-01-18 DIAGNOSIS — I2510 Atherosclerotic heart disease of native coronary artery without angina pectoris: Secondary | ICD-10-CM | POA: Diagnosis present

## 2013-01-18 DIAGNOSIS — G43909 Migraine, unspecified, not intractable, without status migrainosus: Secondary | ICD-10-CM | POA: Diagnosis present

## 2013-01-18 DIAGNOSIS — K219 Gastro-esophageal reflux disease without esophagitis: Secondary | ICD-10-CM | POA: Diagnosis present

## 2013-01-18 LAB — CBC
HCT: 44.6 % (ref 36.0–46.0)
MCH: 30.5 pg (ref 26.0–34.0)
MCHC: 35 g/dL (ref 30.0–36.0)
RDW: 13.6 % (ref 11.5–15.5)

## 2013-01-18 MED ORDER — METOPROLOL TARTRATE 25 MG PO TABS: 12.5000 mg | ORAL_TABLET | Freq: Two times a day (BID) | ORAL | Status: DC

## 2013-01-18 MED ORDER — ATORVASTATIN CALCIUM 80 MG PO TABS: 80.0000 mg | ORAL_TABLET | Freq: Every day | ORAL | Status: DC

## 2013-01-18 MED ORDER — CLOPIDOGREL BISULFATE 75 MG PO TABS: 75.0000 mg | ORAL_TABLET | Freq: Every day | ORAL | Status: DC

## 2013-01-18 MED ORDER — ASPIRIN 81 MG PO TBEC: 81.0000 mg | DELAYED_RELEASE_TABLET | Freq: Every day | ORAL | Status: DC

## 2013-01-18 MED ORDER — NITROGLYCERIN 0.4 MG SL SUBL: 0.4000 mg | SUBLINGUAL_TABLET | SUBLINGUAL | Status: DC | PRN

## 2013-01-18 NOTE — Telephone Encounter (Signed)
TCM BEING D/C'D ON 01/18/13 / TGS

## 2013-01-18 NOTE — Progress Notes (Signed)
0905-1000 Cardiac Rehab On arrival pt up in room at sink, brushing teeth. States that she feels better but still has a headache. Completed discharge education with pt. She voices understanding. Pt states that she felt like she was having some indigestion, then stated she had pain in her left jaw. BP 114/64 placed O2 on pt 2L, reported to RN. RN in room with pt.  Melina Copa RN

## 2013-01-18 NOTE — Discharge Summary (Signed)
Patient ID: Stacey Lawrence,  MRN: [id number], DOB/AGE: September 16, 1951 62 y.o.  Admit date: 01/16/2013 Discharge date: 01/18/2013  Primary Care Provider: [facility] Primary Cardiologist: R. Dietrich Pates, MD  Discharge Diagnoses Principal Problem:   Acute non-STEMI < 8 weeks prior, subsequent admission after initial  **s/p cath this admission revealing subtotal, thrombotic occlusion of a small second obtuse marginal, which was felt to be secondary to spontaneous coronary artery dissection->Med Rx.  Active Problems:   CAD (coronary artery disease)   Fibromyalgia   GERD (gastroesophageal reflux disease)   Migraine headache  Allergies Allergies  Allergen Reactions  . Dilaudid (Hydromorphone Hcl) Other (See Comments)    Resp. arrest  . Morphine And Related Nausea And Vomiting  . Codeine Nausea And Vomiting and Rash   Procedures  Cardiac Catheterization 01/16/2013  Findings:  Ao Pressure: 129/78 (102) LV Pressure:  126/2/8 There was no signficant gradient across the aortic valve on pullback.  Left main: Anomalous. Arising in the right cusp anterior to RCA origin. Long. No stenosis. Gives off LAD, Ramus and LCX.  LAD:  Gives off 2 diagonals. There is an intramyocardial segment (bridge) in the mid LAD with 30% plaque. Otherwise normal. RAMUS: Normal LCX:  Gives off small OM-1, OM-2 and OM-3. OM-2 is a very small caliber vessel which extends out to the distal anterolateral wall and apex. In the middle to distal OM-2 the vessel is subtotally occluded with thrombus RCA: Large dominant vessel gives off large PDA and PL.  LV-gram done in the RAO projection: Ejection fraction = 55% focal area of hypokinesis in mid to distal anterolateral wall  Assessment: 1. CAD with occlusion of a small OM-2 - felt to be secondary to spontaneous coronary artery dissection. 2. 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 EF 55% with focal area of hypokinesis in mid to distal  anterolateral wall  Plan/Discussion:  The OM-2 is too small for PCI. Will treat medically with aggressive RF modification. Start Plavix _____________  History of Present Illness  62 year old female without prior history of coronary artery disease who was in her usual state of health until 01/16/2013 when while driving to work she had sudden onset of substernal chest heaviness associated with diaphoresis, nausea, and mild dyspnea. She presented to the emergency department at [facility] where CT of the chest and abdomen were negative. Troponin was found to be elevated at 2.77. ECG showed anterior ST and T changes. Cardiology was consulted and felt that she required transfer to [facility] for further evaluation and management of non-ST segment elevation myocardial infarction.  Hospital Course  Following arrival to [facility], patient underwent diagnostic cardiac catheterization which revealed a subtotal occlusion of the distal second obtuse marginal with evidence of thrombus burden. This was a very small vessel and was not felt to be amenable to PCI. She otherwise had nonobstructive CAD and normal LV function. Films were reviewed and it was felt that her distal obtuse marginal disease likely represented spontaneous coronary artery dissection however because of the small size of the vessel, continue medical therapy was recommended. As a result, she was maintained on aspirin, Plavix, statin, and low-dose beta blocker therapy as her blood pressure tolerated. Long-acting nitrates were avoided in the setting of prior history of migraine headaches with headaches occurring after catheterization.  She was monitored in the cardiac step down where she eventually peaked her troponin at 11.02.  She was seen by cardiac rehabilitation and was able to ambulate without difficulty.  An outpatient referral for cardiac rehabilitation has been made. We plan to discharge Stacey Lawrence today in good condition. We have  arranged for followup in our [place] office within the next 7 days.  Discharge Vitals Blood pressure 123/42, pulse 67, temperature 98.1 F (36.7 C), temperature source Oral, resp. rate 11, height 5\' 8"  (1.727 m), weight 148 lb 5.9 oz (67.3 kg), SpO2 96.00%.  Filed Weights   01/16/13 0654 01/16/13 1510  Weight: 150 lb (68.04 kg) 148 lb 5.9 oz (67.3 kg)   Labs  CBC  Recent Labs  01/16/13 0719  01/17/13 0450 01/18/13 0444  WBC 7.1  < > 7.0 7.9  NEUTROABS 4.1  --   --   --   HGB 15.4*  < > 15.5* 15.6*  HCT 43.9  < > 45.3 44.6  MCV 88.0  < > 87.6 87.1  PLT 188  < > 208 182  < > = values in this interval not displayed. Basic Metabolic Panel  Recent Labs  01/16/13 0719 01/16/13 1956  NA 135  --   K 3.9  --   CL 102  --   CO2 23  --   GLUCOSE 173*  --   BUN 18  --   CREATININE 0.79 0.69  CALCIUM 8.8  --    Cardiac Enzymes  Recent Labs  01/16/13 1046 01/17/13 0815 01/17/13 1429  TROPONINI 2.77* 11.02* 8.43*   Thyroid Function Tests  Recent Labs  01/16/13 1046  TSH 2.090   Disposition  Pt is being discharged home today in good condition.  Follow-up Plans & Appointments  Follow-up Information   Follow up with Joni Reining, NP On 01/24/2013. (1 PM)    Contact information:   [address] [place] [address] [phone]       Follow up with [facility], MD. (as scheduled)    Contact information:   [address] [place] [address] [phone]     Discharge Medications    Medication List    STOP taking these medications       ibuprofen 200 MG tablet  Commonly known as:  ADVIL,MOTRIN      TAKE these medications       aspirin 81 MG EC tablet  Take 1 tablet (81 mg total) by mouth daily.     atorvastatin 80 MG tablet  Commonly known as:  LIPITOR  Take 1 tablet (80 mg total) by mouth daily at 6 PM.     clopidogrel 75 MG tablet  Commonly known as:  PLAVIX  Take 1 tablet (75 mg total) by mouth daily with breakfast.      metoprolol tartrate 25 MG tablet  Commonly known as:  LOPRESSOR  Take 0.5 tablets (12.5 mg total) by mouth 2 (two) times daily.     nitroGLYCERIN 0.4 MG SL tablet  Commonly known as:  NITROSTAT  Place 1 tablet (0.4 mg total) under the tongue every 5 (five) minutes as needed for chest pain.     pantoprazole 40 MG tablet  Commonly known as:  PROTONIX  Take 1 tablet (40 mg total) by mouth daily.      Outstanding Labs/Studies  F/u lipids/lft's in 8 wks.  Duration of Discharge Encounter   Greater than 30 minutes including physician time.  Signed, Nicolasa Ducking NP 01/18/2013, 10:27 AM

## 2013-01-18 NOTE — Progress Notes (Signed)
Came to visit patient on behalf of [employer] as a benefit of having [employer]. However, she was already discharged. Will follow up post discharge telephonically.   Raiford Noble, MSN- Ed, RN,BSN- [facility] [phone]

## 2013-01-18 NOTE — Progress Notes (Signed)
   Subjective:  No chest pain since yesterday morning. Still has headache which is not unusual for her.  Tolerated ambulation yesterday. Plans to enroll in outpatient rehab in [place].  Objective:  Vital Signs in the last 24 hours: Temp:  [97.9 F (36.6 C)-98.3 F (36.8 C)] 98.1 F (36.7 C) (05/01 2330) Pulse Rate:  [63-86] 67 (05/02 0555) Resp:  [11-23] 14 (05/02 0555) BP: (87-124)/(42-58) 96/51 mmHg (05/02 0555) SpO2:  [96 %-100 %] 96 % (05/02 0555)  Intake/Output from previous day: 05/01 0701 - 05/02 0700 In: 330 [P.O.:330] Out: -  Intake/Output from this shift:    . aspirin EC  81 mg Oral Daily  . atorvastatin  80 mg Oral q1800  . clopidogrel  75 mg Oral Q breakfast  . heparin  5,000 Units Subcutaneous Q8H  . metoprolol tartrate  12.5 mg Oral BID  . pantoprazole  40 mg Oral Daily  . sodium chloride  3 mL Intravenous Q12H      Physical Exam: The patient appears to be in no distress.  Head and neck exam reveals that the pupils are equal and reactive.  The extraocular movements are full.  There is no scleral icterus.  Mouth and pharynx are benign.  No lymphadenopathy.  No carotid bruits.  The jugular venous pressure is normal.  Thyroid is not enlarged or tender.  Chest is clear to percussion and auscultation.  No rales or rhonchi.  Expansion of the chest is symmetrical.  Heart reveals no abnormal lift or heave.  First and second heart sounds are normal.  There is no murmur gallop rub or click.  The abdomen is soft and nontender.  Bowel sounds are normoactive.  There is no hepatosplenomegaly or mass.  There are no abdominal bruits.  Extremities reveal no phlebitis or edema.  Pedal pulses are good.  There is no cyanosis or clubbing.  Neurologic exam is normal strength and no lateralizing weakness.  No sensory deficits.  Integument reveals no rash  Lab Results:  Recent Labs  01/17/13 0450 01/18/13 0444  WBC 7.0 7.9  HGB 15.5* 15.6*  PLT 208 182     Recent Labs  01/16/13 0719 01/16/13 1956  NA 135  --   K 3.9  --   CL 102  --   CO2 23  --   GLUCOSE 173*  --   BUN 18  --   CREATININE 0.79 0.69    Recent Labs  01/17/13 0815 01/17/13 1429  TROPONINI 11.02* 8.43*   Hepatic Function Panel No results found for this basename: PROT, ALBUMIN, AST, ALT, ALKPHOS, BILITOT, BILIDIR, IBILI,  in the last 72 hours No results found for this basename: CHOL,  in the last 72 hours No results found for this basename: PROTIME,  in the last 72 hours  Imaging: Imaging results have been reviewed  Cardiac Studies:  Telemetry shows normal sinus rhythm Assessment/Plan:  1. NSTEMI: Doing well after cardiac catheterization.  Medical treatment indicated. Troponins trending down. Her MI was secondary to SCAD per Dr. Earmon Phoenix review of films. BP still soft.  Continue BB 2. History of Fibromyalgia: Recently treated with steroid Dosepak.  3. History of migraine Continue phenergan which she uses chronically at home. Follow-up with PCP 4. Hx of GERD  Plan: Okay for discharge today.  Follow-up with Dr. Dietrich Pates in several weeks.   LOS: 2 days    Cassell Clement 01/18/2013, 7:35 AM

## 2013-01-21 NOTE — Telephone Encounter (Signed)
Called pt home number and noted someone picked up phone, hung up, times 2, called mobile and noted no vm capability noted, will try to call again   Patient contacted regarding discharge from [facility]  Patient understands to follow up with provider KL 01-22-13 2PM [place] OFFICE Patient understands discharge instructions?  Patient understands medications and regiment?  Patient understands to bring all medications to this visit?

## 2013-01-21 NOTE — Telephone Encounter (Signed)
Called pt on mobile and home and still noted no pick up

## 2013-01-22 ENCOUNTER — Ambulatory Visit (INDEPENDENT_AMBULATORY_CARE_PROVIDER_SITE_OTHER): Payer: 59 | Admitting: Adult Health

## 2013-01-22 ENCOUNTER — Encounter: Payer: Self-pay | Admitting: Adult Health

## 2013-01-22 VITALS — BP 108/76 | HR 65 | Ht 68.0 in | Wt 148.1 lb

## 2013-01-22 DIAGNOSIS — I2510 Atherosclerotic heart disease of native coronary artery without angina pectoris: Secondary | ICD-10-CM

## 2013-01-22 DIAGNOSIS — M797 Fibromyalgia: Secondary | ICD-10-CM

## 2013-01-22 DIAGNOSIS — IMO0001 Reserved for inherently not codable concepts without codable children: Secondary | ICD-10-CM

## 2013-01-22 NOTE — Patient Instructions (Addendum)
Your physician recommends that you continue on your current medications as directed. Please refer to the Current Medication list given to you today.  You have been referred to [facility]   Your physician recommends that you schedule a follow-up appointment in: ONE MONTH WITH Alveria Apley, NP

## 2013-01-22 NOTE — Progress Notes (Signed)
HPI Stacey Lawrence is a 62 year old female patient of Dr. Buxton Bing we are following posthospitalization in the setting of non-ST elevation MI, after transfer from [facility] in the setting of chest pain. Patient underwent cardiac catheterization demonstrating a subtotal thrombotic occlusion of the small second obtuse marginal which was felt to be secondary to spontaneous coronary artery dissection. Due to its small size, the patient was to be treated medically, and started on dual antiplatelet therapy with aspirin and Plavix. The patient was also started low-dose beta blocker and nitrates were avoided in the setting of prior history of migraines. She was placed on a statin as well.   She comes today without cardiac complaints. She is concerned about returning to work and restrictions. He is interested in cardiac rehabilitation. She is not having taken a nitroglycerin sublingual secondary to discomfort, and is reluctant to do so secondary to history of migraines.     Allergies  Allergen Reactions  . Dilaudid (Hydromorphone Hcl) Other (See Comments)    Resp. arrest  . Morphine And Related Nausea And Vomiting  . Codeine Nausea And Vomiting and Rash    Current Outpatient Prescriptions  Medication Sig Dispense Refill  . aspirin EC 81 MG EC tablet Take 1 tablet (81 mg total) by mouth daily.      Marland Kitchen atorvastatin (LIPITOR) 80 MG tablet Take 1 tablet (80 mg total) by mouth daily at 6 PM.  30 tablet  6  . clopidogrel (PLAVIX) 75 MG tablet Take 1 tablet (75 mg total) by mouth daily with breakfast.  30 tablet  6  . metoprolol tartrate (LOPRESSOR) 25 MG tablet Take 0.5 tablets (12.5 mg total) by mouth 2 (two) times daily.  30 tablet  6  . nitroGLYCERIN (NITROSTAT) 0.4 MG SL tablet Place 1 tablet (0.4 mg total) under the tongue every 5 (five) minutes as needed for chest pain.  25 tablet  3  . pantoprazole (PROTONIX) 40 MG tablet Take 1 tablet (40 mg total) by mouth daily.  30 tablet  2   No current  facility-administered medications for this visit.    Past Medical History  Diagnosis Date  . PONV (postoperative nausea and vomiting)   . Fibromyalgia   . Migraine headache     migraines and cluster;last migraine 3 mon ago  . Joint pain   . Joint swelling   . Scoliosis   . GERD (gastroesophageal reflux disease)     hasn't started taking any meds  . IBS (irritable bowel syndrome)     constipation  . Hx of colonic polyps   . Kidney stones 03/2010  . History of blood transfusion     69yrs ago  . Polycythemia   . CAD (coronary artery disease)     a. 12/2012 NSTEMI/Cath: LM anomalous, arising in R cusp ant to RCA, otw nl, LAD 47m with bridge, RI nl, LCX nl, OM2 small, subtl occl w/ thrombus distally - felt to be spont dissection, too small for PCI->Med Rx, RCA large, dom, nl, PDA/PD nl, EF 55% w/ focal HK in mid-dist antlat wall.    Past Surgical History  Procedure Laterality Date  . Abdominal hysterectomy      part. hyst.  . Tubal ligation    . Cholecystectomy  10/28/2002    lap.  . Debridement tennis elbow    . Elbow surgery      golfer's elbow-bilateral  . Knee debridement      right  . Appendectomy  with explor. lap.  . Stapedectomy      right  . Trigger finger release  08/19/2011    Procedure: RELEASE TRIGGER FINGER/A-1 PULLEY;  Surgeon: Oletta Cohn III;  Location: [facility];  Service: Orthopedics;  Laterality: Right;  right middle finger a-1 pulley release with tenosynovectomy  . Tonsillectomy and adenoidectomy    . Diagnostic laparoscopy    . Dilation and curettage of uterus    . Tympanoplasty    . Colonoscopy    . I&d extremity  02/25/2012    Procedure: IRRIGATION AND DEBRIDEMENT EXTREMITY;  Surgeon: Dominica Severin, MD;  Location: [facility] OR;  Service: Orthopedics;  Laterality: Right;  Irrigation and Debridement and Repair Right Thumb Nerve Laceration and Assoiated Structures   . Laparotomy    . Esophagogastroduodenoscopy  09/10/2012    Procedure:  ESOPHAGOGASTRODUODENOSCOPY (EGD);  Surgeon: Malissa Hippo, MD;  Location: [facility];  Service: Endoscopy;  Laterality: N/A;  730    ZOX:WRUEAV of systems complete and found to be negative unless listed above  PHYSICAL EXAM BP 108/76  Pulse 65  Ht 5\' 8"  (1.727 m)  Wt 148 lb 1.9 oz (67.187 kg)  BMI 22.53 kg/m2  General: Well developed, well nourished, in no acute distress Head: Eyes PERRLA, No xanthomas.   Normal cephalic and atramatic  Lungs: Clear bilaterally to auscultation and percussion. Heart: HRRR S1 S2, without MRG.  Pulses are 2+ & equal.            No carotid bruit. No JVD.  No abdominal bruits. No femoral bruits. Abdomen: Bowel sounds are positive, abdomen soft and non-tender without masses or                  Hernia's noted. Msk:  Back normal, normal gait. Normal strength and tone for age. Extremities: No clubbing, cyanosis or edema.  DP +1 Neuro: Alert and oriented X 3. Psych:  Good affect, responds appropriately  EKG: Normal sinus rhythm with first degree AV block PR interval 0.234 ms heart rate of 65 beats per minute. Incomplete right bundle branch block.  ASSESSMENT AND PLAN

## 2013-01-22 NOTE — Addendum Note (Signed)
Addended by: Derry Lory A on: 01/22/2013 03:34 PM   Modules accepted: Orders

## 2013-01-22 NOTE — Assessment & Plan Note (Addendum)
The patient is doing well from a cardiac standpoint. She is tolerating dual antiplatelet therapy without evidence of bleeding. There is some bruising noted on her right wrist at catheter insertion site. The patient will be referred to cardiac rehabilitation. FMLA forms are completed. Will continued risk modification She can return to work on May 12th without restriction.

## 2013-01-22 NOTE — Assessment & Plan Note (Signed)
Continue some muscle aches and pains in her lower extremities and arms. We will defer to primary care for ongoing assessment and treatment.

## 2013-01-22 NOTE — Telephone Encounter (Signed)
Noted pt was evaluated today via KL in office visit

## 2013-01-24 ENCOUNTER — Encounter: Payer: 59 | Admitting: Adult Health

## 2013-01-31 ENCOUNTER — Encounter (HOSPITAL_COMMUNITY): Payer: Self-pay

## 2013-01-31 ENCOUNTER — Encounter (HOSPITAL_COMMUNITY)
Admission: RE | Admit: 2013-01-31 | Discharge: 2013-01-31 | Disposition: A | Discharge status: Home / Self Care | Payer: 59 | Source: Ambulatory Visit | Attending: Cardiology | Admitting: Cardiology

## 2013-01-31 VITALS — BP 100/62 | HR 55 | Ht 68.0 in | Wt 153.1 lb

## 2013-01-31 DIAGNOSIS — Z5189 Encounter for other specified aftercare: Secondary | ICD-10-CM | POA: Insufficient documentation

## 2013-01-31 DIAGNOSIS — I214 Non-ST elevation (NSTEMI) myocardial infarction: Secondary | ICD-10-CM

## 2013-01-31 DIAGNOSIS — I252 Old myocardial infarction: Secondary | ICD-10-CM | POA: Insufficient documentation

## 2013-01-31 NOTE — Progress Notes (Addendum)
Referral sent by Dr. Patty Sermons due to NSTEMI 410.70. During orientation advised patient on arrival and appointment times what to wear, what to do before, during and after exercise. Reviewed attendance and class policy. Talked about inclement weather and class consultation policy. Pt is scheduled to start Cardiac Rehab on 02/04/13 at 6:45 am. Pt was advised to come to class 5 minutes before class starts. He was also given instructions on meeting with the dietician and attending the Family Structure classes. Pt is eager to get started. Patient was able to complete 6 minute pre-walk test.

## 2013-01-31 NOTE — Patient Instructions (Signed)
Pt has finished orientation and is scheduled to start CR on 02/04/13 at 6:45 am. Pt has been instructed to arrive to class 15 minutes early for scheduled class. Pt has been instructed to wear comfortable clothing and shoes with rubber soles. Pt has been told to take their medications 1 hour prior to coming to class.  If the patient is not going to attend class, he/she has been instructed to call.

## 2013-02-04 ENCOUNTER — Encounter (HOSPITAL_COMMUNITY)
Admission: RE | Admit: 2013-02-04 | Discharge: 2013-02-04 | Disposition: A | Discharge status: Home / Self Care | Payer: 59 | Source: Ambulatory Visit | Attending: Cardiology | Admitting: Cardiology

## 2013-02-06 ENCOUNTER — Encounter (HOSPITAL_COMMUNITY)
Admission: RE | Admit: 2013-02-06 | Discharge: 2013-02-06 | Disposition: A | Discharge status: Home / Self Care | Payer: 59 | Source: Ambulatory Visit | Attending: Cardiology | Admitting: Cardiology

## 2013-02-08 ENCOUNTER — Encounter (HOSPITAL_COMMUNITY)
Admission: RE | Admit: 2013-02-08 | Discharge: 2013-02-08 | Disposition: A | Discharge status: Home / Self Care | Payer: 59 | Source: Ambulatory Visit | Attending: Cardiology | Admitting: Cardiology

## 2013-02-11 ENCOUNTER — Encounter (HOSPITAL_COMMUNITY): Payer: 59

## 2013-02-13 ENCOUNTER — Encounter (HOSPITAL_COMMUNITY)
Admission: RE | Admit: 2013-02-13 | Discharge: 2013-02-13 | Disposition: A | Discharge status: Home / Self Care | Payer: 59 | Source: Ambulatory Visit | Attending: Cardiology | Admitting: Cardiology

## 2013-02-14 ENCOUNTER — Encounter: Payer: Self-pay | Admitting: Adult Health

## 2013-02-14 NOTE — Progress Notes (Signed)
HPI: Stacey Lawrence is a 62 year old female patient of Dr. [place] Bing we are following posthospitalization in the setting of non-ST elevation MI, after transfer from [facility] in the setting of chest pain. Patient underwent cardiac catheterization demonstrating a subtotal thrombotic occlusion of the small second obtuse marginal which was felt to be secondary to spontaneous coronary artery dissection. Due to its small size, the patient was to be treated medically, and started on dual antiplatelet therapy with aspirin and Plavix. The patient was also started low-dose beta blocker and nitrates were avoided in the setting of prior history of migraines. She was placed on a statin as well. She was last in the office on 01/22/2013 and was doing very well. She is released to return to work on May 12 without restriction.   She comes today with complaints of overall fatigue. No recurrent chest pain. She is back to work full time.    Allergies  Allergen Reactions  . Dilaudid (Hydromorphone Hcl) Other (See Comments)    Resp. arrest  . Morphine And Related Nausea And Vomiting  . Codeine Nausea And Vomiting and Rash    Current Outpatient Prescriptions  Medication Sig Dispense Refill  . aspirin EC 81 MG EC tablet Take 1 tablet (81 mg total) by mouth daily.      Marland Kitchen atorvastatin (LIPITOR) 80 MG tablet Take 1 tablet (80 mg total) by mouth daily at 6 PM.  30 tablet  6  . clopidogrel (PLAVIX) 75 MG tablet Take 1 tablet (75 mg total) by mouth daily with breakfast.  30 tablet  6  . metoprolol tartrate (LOPRESSOR) 25 MG tablet Take 0.5 tablets (12.5 mg total) by mouth 2 (two) times daily.  30 tablet  6  . nitroGLYCERIN (NITROSTAT) 0.4 MG SL tablet Place 1 tablet (0.4 mg total) under the tongue every 5 (five) minutes as needed for chest pain.  25 tablet  3  . pantoprazole (PROTONIX) 40 MG tablet Take 1 tablet (40 mg total) by mouth daily.  30 tablet  2  . metoprolol succinate (TOPROL-XL) 25 MG 24 hr tablet Take 1  tablet (25 mg total) by mouth at bedtime.  90 tablet  1   No current facility-administered medications for this visit.    Past Medical History  Diagnosis Date  . PONV (postoperative nausea and vomiting)   . Fibromyalgia   . Migraine headache     migraines and cluster;last migraine 3 mon ago  . Joint pain   . Joint swelling   . Scoliosis   . GERD (gastroesophageal reflux disease)     hasn't started taking any meds  . IBS (irritable bowel syndrome)     constipation  . Hx of colonic polyps   . Kidney stones 03/2010  . History of blood transfusion     74yrs ago  . Polycythemia   . CAD (coronary artery disease)     a. 12/2012 NSTEMI/Cath: LM anomalous, arising in R cusp ant to RCA, otw nl, LAD 57m with bridge, RI nl, LCX nl, OM2 small, subtl occl w/ thrombus distally - felt to be spont dissection, too small for PCI->Med Rx, RCA large, dom, nl, PDA/PD nl, EF 55% w/ focal HK in mid-dist antlat wall.    Past Surgical History  Procedure Laterality Date  . Abdominal hysterectomy      part. hyst.  . Tubal ligation    . Cholecystectomy  10/28/2002    lap.  . Debridement tennis elbow    . Elbow surgery  golfer's elbow-bilateral  . Knee debridement      right  . Appendectomy      with explor. lap.  . Stapedectomy      right  . Trigger finger release  08/19/2011    Procedure: RELEASE TRIGGER FINGER/A-1 PULLEY;  Surgeon: Oletta Cohn III;  Location: [facility];  Service: Orthopedics;  Laterality: Right;  right middle finger a-1 pulley release with tenosynovectomy  . Tonsillectomy and adenoidectomy    . Diagnostic laparoscopy    . Dilation and curettage of uterus    . Tympanoplasty    . Colonoscopy    . I&d extremity  02/25/2012    Procedure: IRRIGATION AND DEBRIDEMENT EXTREMITY;  Surgeon: Dominica Severin, MD;  Location: [facility];  Service: Orthopedics;  Laterality: Right;  Irrigation and Debridement and Repair Right Thumb Nerve Laceration and Assoiated Structures   .  Laparotomy    . Esophagogastroduodenoscopy  09/10/2012    Procedure: ESOPHAGOGASTRODUODENOSCOPY (EGD);  Surgeon: Malissa Hippo, MD;  [facility];  Service: Endoscopy;  Laterality: N/A;  730    NWG:NFAOZH of systems complete and found to be negative unless listed above  PHYSICAL EXAM BP 116/68  Pulse 64  Ht 5\' 8"  (1.727 m)  Wt 153 lb (69.4 kg)  BMI 23.27 kg/m2  SpO2 97% General: Well developed, well nourished, in no acute distress Head: Eyes PERRLA, No xanthomas.   Normal cephalic and atramatic  Lungs: Clear bilaterally to auscultation and percussion. Heart: HRRR S1 S2, without MRG.  Pulses are 2+ & equal.            No carotid bruit. No JVD.  No abdominal bruits. No femoral bruits. Abdomen: Bowel sounds are positive, abdomen soft and non-tender without masses or                  Hernia's noted. Msk:  Back normal, normal gait. Normal strength and tone for age. Extremities: No clubbing, cyanosis or edema.  DP +1 Neuro: Alert and oriented X 3. Psych:  Good affect, responds appropriately    ASSESSMENT AND PLAN

## 2013-02-15 ENCOUNTER — Ambulatory Visit (INDEPENDENT_AMBULATORY_CARE_PROVIDER_SITE_OTHER): Payer: 59 | Admitting: Adult Health

## 2013-02-15 ENCOUNTER — Encounter: Payer: Self-pay | Admitting: Adult Health

## 2013-02-15 ENCOUNTER — Encounter (HOSPITAL_COMMUNITY)
Admission: RE | Admit: 2013-02-15 | Discharge: 2013-02-15 | Disposition: A | Discharge status: Home / Self Care | Payer: 59 | Source: Ambulatory Visit | Attending: Cardiology | Admitting: Cardiology

## 2013-02-15 VITALS — BP 116/68 | HR 64 | Ht 68.0 in | Wt 153.0 lb

## 2013-02-15 DIAGNOSIS — I2510 Atherosclerotic heart disease of native coronary artery without angina pectoris: Secondary | ICD-10-CM

## 2013-02-15 MED ORDER — METOPROLOL SUCCINATE ER 25 MG PO TB24: 25.0000 mg | ORAL_TABLET | Freq: Every day | ORAL | Status: DC

## 2013-02-15 NOTE — Patient Instructions (Addendum)
Your physician recommends that you schedule a follow-up appointment in: 6 MONTHS  Your physician has recommended you make the following change in your medication:   1) STOP TAKING METOPROLOL 12.5MG  TWICE DAILY 2) START TAKING METOPROLOL XL 25MG  EVERY NIGHT

## 2013-02-15 NOTE — Progress Notes (Deleted)
Name: Stacey Lawrence    DOB: [birth year]  Age: 62 y.o.  MR#: [id number]       PCP:  Catalina Pizza, MD      Insurance: Payor: [employer] / Plan: [employer] / Product Type: *No Product type* /   CC:   No chief complaint on file.   VS Filed Vitals:   02/15/13 1507  BP: 116/68  Pulse: 64  Height: 5\' 8"  (1.727 m)  Weight: 153 lb (69.4 kg)  SpO2: 97%    Weights Current Weight  02/15/13 153 lb (69.4 kg)  01/31/13 153 lb 1.6 oz (69.446 kg)  01/22/13 148 lb 1.9 oz (67.187 kg)    Blood Pressure  BP Readings from Last 3 Encounters:  02/15/13 116/68  01/31/13 100/62  01/22/13 108/76     Admit date:  (Not on file) Last encounter with [facility]:  01/24/2013   Allergy Dilaudid; Morphine and related; and Codeine  Current Outpatient Prescriptions  Medication Sig Dispense Refill  . aspirin EC 81 MG EC tablet Take 1 tablet (81 mg total) by mouth daily.      Marland Kitchen atorvastatin (LIPITOR) 80 MG tablet Take 1 tablet (80 mg total) by mouth daily at 6 PM.  30 tablet  6  . clopidogrel (PLAVIX) 75 MG tablet Take 1 tablet (75 mg total) by mouth daily with breakfast.  30 tablet  6  . metoprolol tartrate (LOPRESSOR) 25 MG tablet Take 0.5 tablets (12.5 mg total) by mouth 2 (two) times daily.  30 tablet  6  . nitroGLYCERIN (NITROSTAT) 0.4 MG SL tablet Place 1 tablet (0.4 mg total) under the tongue every 5 (five) minutes as needed for chest pain.  25 tablet  3  . pantoprazole (PROTONIX) 40 MG tablet Take 1 tablet (40 mg total) by mouth daily.  30 tablet  2   No current facility-administered medications for this visit.    Discontinued Meds:   There are no discontinued medications.  Patient Active Problem List   Diagnosis Date Noted  . CAD (coronary artery disease)   . Fibromyalgia   . GERD (gastroesophageal reflux disease)   . Migraine headache   . Acute non-STEMI < 8 weeks prior, subsequent admission after initial 01/16/2013  . Radial nerve laceration 03/27/2012  . Lack of coordination  03/27/2012  . Muscle weakness (generalized) 03/27/2012    LABS    Component Value Date/Time   NA 135 01/16/2013 0719   NA 138 02/24/2012 1200   NA 136 04/29/2010 1200   K 3.9 01/16/2013 0719   K 3.9 02/24/2012 1200   K 4.0 04/29/2010 1200   CL 102 01/16/2013 0719   CL 101 02/24/2012 1200   CL 107 04/29/2010 1200   CO2 23 01/16/2013 0719   CO2 26 02/24/2012 1200   CO2 23 04/29/2010 1200   GLUCOSE 173* 01/16/2013 0719   GLUCOSE 109* 02/24/2012 1200   GLUCOSE 111* 04/29/2010 1200   BUN 18 01/16/2013 0719   BUN 14 02/24/2012 1200   BUN 13 04/29/2010 1200   CREATININE 0.69 01/16/2013 1956   CREATININE 0.79 01/16/2013 0719   CREATININE 0.88 09/17/2012 0818   CREATININE 0.84 02/24/2012 1200   CALCIUM 8.8 01/16/2013 0719   CALCIUM 10.3 02/24/2012 1200   CALCIUM 8.9 04/29/2010 1200   GFRNONAA >90 01/16/2013 1956   GFRNONAA 89* 01/16/2013 0719   GFRNONAA 75* 02/24/2012 1200   GFRAA >90 01/16/2013 1956   GFRAA >90 01/16/2013 0719   GFRAA 86* 02/24/2012 1200  CMP     Component Value Date/Time   NA 135 01/16/2013 0719   K 3.9 01/16/2013 0719   CL 102 01/16/2013 0719   CO2 23 01/16/2013 0719   GLUCOSE 173* 01/16/2013 0719   BUN 18 01/16/2013 0719   CREATININE 0.69 01/16/2013 1956   CREATININE 0.88 09/17/2012 0818   CALCIUM 8.8 01/16/2013 0719   PROT 6.6 04/29/2010 1200   ALBUMIN 4.1 04/29/2010 1200   AST 20 04/29/2010 1200   ALT 12 04/29/2010 1200   ALKPHOS 88 04/29/2010 1200   BILITOT 0.5 04/29/2010 1200   GFRNONAA >90 01/16/2013 1956   GFRAA >90 01/16/2013 1956       Component Value Date/Time   WBC 7.9 01/18/2013 0444   WBC 7.0 01/17/2013 0450   WBC 9.0 01/16/2013 1956   HGB 15.6* 01/18/2013 0444   HGB 15.5* 01/17/2013 0450   HGB 16.2* 01/16/2013 1956   HCT 44.6 01/18/2013 0444   HCT 45.3 01/17/2013 0450   HCT 46.0 01/16/2013 1956   MCV 87.1 01/18/2013 0444   MCV 87.6 01/17/2013 0450   MCV 87.0 01/16/2013 1956    Lipid Panel     Component Value Date/Time   CHOL  Value: 170        ATP III CLASSIFICATION:  <200     mg/dL    Desirable  409-811  mg/dL   Borderline High  >=914    mg/dL   High        7/82/9562 0310   TRIG 59 03/31/2009 0310   HDL 59 03/31/2009 0310   CHOLHDL 2.9 03/31/2009 0310   VLDL 12 03/31/2009 0310   LDLCALC  Value: 99        Total Cholesterol/HDL:CHD Risk Coronary Heart Disease Risk Table                     Men   Women  1/2 Average Risk   3.4   3.3  Average Risk       5.0   4.4  2 X Average Risk   9.6   7.1  3 X Average Risk  23.4   11.0        Use the calculated Patient Ratio above and the CHD Risk Table to determine the patient's CHD Risk.        ATP III CLASSIFICATION (LDL):  <100     mg/dL   Optimal  130-865  mg/dL   Near or Above                    Optimal  130-159  mg/dL   Borderline  784-696  mg/dL   High  >295     mg/dL   Very High 2/84/1324 4010    ABG    Component Value Date/Time   PHART 7.404* 05/12/2010 1350   PCO2ART 34.6* 05/12/2010 1350   PO2ART 98.3 05/12/2010 1350   HCO3 21.2 05/12/2010 1350   TCO2 18.5 05/12/2010 1350   ACIDBASEDEF 2.8* 05/12/2010 1350   O2SAT 97.9 05/12/2010 1350     Lab Results  Component Value Date   TSH 2.090 01/16/2013   BNP (last 3 results) No results found for this basename: PROBNP,  in the last 8760 hours Cardiac Panel (last 3 results) No results found for this basename: CKTOTAL, CKMB, TROPONINI, RELINDX,  in the last 72 hours  Iron/TIBC/Ferritin No results found for this basename: iron, tibc, ferritin     EKG Orders placed in visit on 01/22/13  .  EKG 12-LEAD     Prior Assessment and Plan Problem List as of 02/15/2013   Radial nerve laceration   Lack of coordination   Muscle weakness (generalized)   Acute non-STEMI < 8 weeks prior, subsequent admission after initial   CAD (coronary artery disease)   Last Assessment & Plan   01/22/2013 Office Visit Edited 01/22/2013  3:13 PM by Jodelle Gross, NP     The patient is doing well from a cardiac standpoint. She is tolerating dual antiplatelet therapy without evidence of bleeding. There is some  bruising noted on her right wrist at catheter insertion site. The patient will be referred to cardiac rehabilitation. FMLA forms are completed. Will continued risk modification She can return to work on May 12th without restriction.    Fibromyalgia   Last Assessment & Plan   01/22/2013 Office Visit Written 01/22/2013  3:12 PM by Jodelle Gross, NP     Continue some muscle aches and pains in her lower extremities and arms. We will defer to primary care for ongoing assessment and treatment.    GERD (gastroesophageal reflux disease)   Migraine headache       Imaging: No results found.

## 2013-02-15 NOTE — Assessment & Plan Note (Signed)
She continues to have fatigue. This may be in relation to BB therapy. I will change her BB to long acting metoprolol 25 mg at HS to assist with daytime fatigue. Otherwise, will continue plavix and ASA. She will follow up in 6 months unless symptomatic.

## 2013-02-18 ENCOUNTER — Encounter (HOSPITAL_COMMUNITY): Payer: 59

## 2013-02-20 ENCOUNTER — Encounter (HOSPITAL_COMMUNITY)
Admission: RE | Admit: 2013-02-20 | Discharge: 2013-02-20 | Disposition: A | Discharge status: Home / Self Care | Payer: 59 | Source: Ambulatory Visit | Attending: Cardiology | Admitting: Cardiology

## 2013-02-20 DIAGNOSIS — I252 Old myocardial infarction: Secondary | ICD-10-CM | POA: Insufficient documentation

## 2013-02-20 DIAGNOSIS — Z5189 Encounter for other specified aftercare: Secondary | ICD-10-CM | POA: Insufficient documentation

## 2013-02-22 ENCOUNTER — Encounter (HOSPITAL_COMMUNITY)
Admission: RE | Admit: 2013-02-22 | Discharge: 2013-02-22 | Disposition: A | Discharge status: Home / Self Care | Payer: 59 | Source: Ambulatory Visit | Attending: Cardiology | Admitting: Cardiology

## 2013-02-25 ENCOUNTER — Encounter (HOSPITAL_COMMUNITY)
Admission: RE | Admit: 2013-02-25 | Discharge: 2013-02-25 | Disposition: A | Discharge status: Home / Self Care | Payer: 59 | Source: Ambulatory Visit | Attending: Cardiology | Admitting: Cardiology

## 2013-02-27 ENCOUNTER — Encounter (HOSPITAL_COMMUNITY)
Admission: RE | Admit: 2013-02-27 | Discharge: 2013-02-27 | Disposition: A | Discharge status: Home / Self Care | Payer: 59 | Source: Ambulatory Visit | Attending: Cardiology | Admitting: Cardiology

## 2013-03-01 ENCOUNTER — Encounter (HOSPITAL_COMMUNITY)
Admission: RE | Admit: 2013-03-01 | Discharge: 2013-03-01 | Disposition: A | Discharge status: Home / Self Care | Payer: 59 | Source: Ambulatory Visit | Attending: Cardiology | Admitting: Cardiology

## 2013-03-04 ENCOUNTER — Encounter (HOSPITAL_COMMUNITY): Payer: 59

## 2013-03-06 ENCOUNTER — Encounter (HOSPITAL_COMMUNITY): Payer: 59

## 2013-03-08 ENCOUNTER — Encounter (HOSPITAL_COMMUNITY)
Admission: RE | Admit: 2013-03-08 | Discharge: 2013-03-08 | Disposition: A | Discharge status: Home / Self Care | Payer: 59 | Source: Ambulatory Visit | Attending: Cardiology | Admitting: Cardiology

## 2013-03-11 ENCOUNTER — Encounter (HOSPITAL_COMMUNITY)
Admission: RE | Admit: 2013-03-11 | Discharge: 2013-03-11 | Disposition: A | Discharge status: Home / Self Care | Payer: 59 | Source: Ambulatory Visit | Attending: Cardiology | Admitting: Cardiology

## 2013-03-13 ENCOUNTER — Encounter (HOSPITAL_COMMUNITY)
Admission: RE | Admit: 2013-03-13 | Discharge: 2013-03-13 | Disposition: A | Discharge status: Home / Self Care | Payer: 59 | Source: Ambulatory Visit | Attending: Cardiology | Admitting: Cardiology

## 2013-03-15 ENCOUNTER — Encounter (HOSPITAL_COMMUNITY): Payer: 59

## 2013-03-18 ENCOUNTER — Encounter (HOSPITAL_COMMUNITY): Payer: 59

## 2013-03-20 ENCOUNTER — Encounter (HOSPITAL_COMMUNITY)
Admission: RE | Admit: 2013-03-20 | Discharge: 2013-03-20 | Disposition: A | Discharge status: Home / Self Care | Payer: 59 | Source: Ambulatory Visit | Attending: Cardiology | Admitting: Cardiology

## 2013-03-20 DIAGNOSIS — Z5189 Encounter for other specified aftercare: Secondary | ICD-10-CM | POA: Insufficient documentation

## 2013-03-20 DIAGNOSIS — I252 Old myocardial infarction: Secondary | ICD-10-CM | POA: Insufficient documentation

## 2013-03-22 ENCOUNTER — Encounter (HOSPITAL_COMMUNITY): Payer: 59

## 2013-03-25 ENCOUNTER — Encounter (HOSPITAL_COMMUNITY)
Admission: RE | Admit: 2013-03-25 | Discharge: 2013-03-25 | Disposition: A | Discharge status: Home / Self Care | Payer: 59 | Source: Ambulatory Visit | Attending: Cardiology | Admitting: Cardiology

## 2013-03-27 ENCOUNTER — Encounter (HOSPITAL_COMMUNITY)
Admission: RE | Admit: 2013-03-27 | Discharge: 2013-03-27 | Disposition: A | Discharge status: Home / Self Care | Payer: 59 | Source: Ambulatory Visit | Attending: Cardiology | Admitting: Cardiology

## 2013-03-29 ENCOUNTER — Encounter (HOSPITAL_COMMUNITY)
Admission: RE | Admit: 2013-03-29 | Discharge: 2013-03-29 | Disposition: A | Discharge status: Home / Self Care | Payer: 59 | Source: Ambulatory Visit | Attending: Cardiology | Admitting: Cardiology

## 2013-04-01 ENCOUNTER — Encounter (HOSPITAL_COMMUNITY)
Admission: RE | Admit: 2013-04-01 | Discharge: 2013-04-01 | Disposition: A | Discharge status: Home / Self Care | Payer: 59 | Source: Ambulatory Visit | Attending: Cardiology | Admitting: Cardiology

## 2013-04-03 ENCOUNTER — Encounter (HOSPITAL_COMMUNITY): Payer: 59

## 2013-04-05 ENCOUNTER — Encounter (HOSPITAL_COMMUNITY)
Admission: RE | Admit: 2013-04-05 | Discharge: 2013-04-05 | Disposition: A | Discharge status: Home / Self Care | Payer: 59 | Source: Ambulatory Visit | Attending: Cardiology | Admitting: Cardiology

## 2013-04-08 ENCOUNTER — Encounter (HOSPITAL_COMMUNITY)
Admission: RE | Admit: 2013-04-08 | Discharge: 2013-04-08 | Disposition: A | Discharge status: Home / Self Care | Payer: 59 | Source: Ambulatory Visit | Attending: Cardiology | Admitting: Cardiology

## 2013-04-10 ENCOUNTER — Encounter (HOSPITAL_COMMUNITY)
Admission: RE | Admit: 2013-04-10 | Discharge: 2013-04-10 | Disposition: A | Discharge status: Home / Self Care | Payer: 59 | Source: Ambulatory Visit | Attending: Cardiology | Admitting: Cardiology

## 2013-04-12 ENCOUNTER — Encounter (HOSPITAL_COMMUNITY)
Admission: RE | Admit: 2013-04-12 | Discharge: 2013-04-12 | Disposition: A | Discharge status: Home / Self Care | Payer: 59 | Source: Ambulatory Visit | Attending: Cardiology | Admitting: Cardiology

## 2013-04-15 ENCOUNTER — Encounter (HOSPITAL_COMMUNITY)
Admission: RE | Admit: 2013-04-15 | Discharge: 2013-04-15 | Disposition: A | Discharge status: Home / Self Care | Payer: 59 | Source: Ambulatory Visit | Attending: Cardiology | Admitting: Cardiology

## 2013-04-17 ENCOUNTER — Encounter (HOSPITAL_COMMUNITY)
Admission: RE | Admit: 2013-04-17 | Discharge: 2013-04-17 | Disposition: A | Discharge status: Home / Self Care | Payer: 59 | Source: Ambulatory Visit | Attending: Cardiology | Admitting: Cardiology

## 2013-04-19 ENCOUNTER — Encounter (HOSPITAL_COMMUNITY)
Admission: RE | Admit: 2013-04-19 | Discharge: 2013-04-19 | Disposition: A | Discharge status: Home / Self Care | Payer: 59 | Source: Ambulatory Visit | Attending: Cardiology | Admitting: Cardiology

## 2013-04-19 DIAGNOSIS — I252 Old myocardial infarction: Secondary | ICD-10-CM | POA: Insufficient documentation

## 2013-04-19 DIAGNOSIS — Z5189 Encounter for other specified aftercare: Secondary | ICD-10-CM | POA: Insufficient documentation

## 2013-04-22 ENCOUNTER — Encounter (HOSPITAL_COMMUNITY): Payer: 59

## 2013-04-24 ENCOUNTER — Encounter (HOSPITAL_COMMUNITY)
Admission: RE | Admit: 2013-04-24 | Discharge: 2013-04-24 | Disposition: A | Discharge status: Home / Self Care | Payer: 59 | Source: Ambulatory Visit | Attending: Cardiology | Admitting: Cardiology

## 2013-04-26 ENCOUNTER — Encounter (HOSPITAL_COMMUNITY)
Admission: RE | Admit: 2013-04-26 | Discharge: 2013-04-26 | Disposition: A | Discharge status: Home / Self Care | Payer: 59 | Source: Ambulatory Visit | Attending: Cardiology | Admitting: Cardiology

## 2013-04-29 ENCOUNTER — Encounter (HOSPITAL_COMMUNITY)
Admission: RE | Admit: 2013-04-29 | Discharge: 2013-04-29 | Disposition: A | Discharge status: Home / Self Care | Payer: 59 | Source: Ambulatory Visit | Attending: Cardiology | Admitting: Cardiology

## 2013-05-01 ENCOUNTER — Encounter (HOSPITAL_COMMUNITY)
Admission: RE | Admit: 2013-05-01 | Discharge: 2013-05-01 | Disposition: A | Discharge status: Home / Self Care | Payer: 59 | Source: Ambulatory Visit | Attending: Cardiology | Admitting: Cardiology

## 2013-05-03 ENCOUNTER — Encounter (HOSPITAL_COMMUNITY)
Admission: RE | Admit: 2013-05-03 | Discharge: 2013-05-03 | Disposition: A | Discharge status: Home / Self Care | Payer: 59 | Source: Ambulatory Visit | Attending: Cardiology | Admitting: Cardiology

## 2013-05-06 ENCOUNTER — Encounter (HOSPITAL_COMMUNITY)
Admission: RE | Admit: 2013-05-06 | Discharge: 2013-05-06 | Disposition: A | Discharge status: Home / Self Care | Payer: 59 | Source: Ambulatory Visit | Attending: Cardiology | Admitting: Cardiology

## 2013-05-08 ENCOUNTER — Encounter (HOSPITAL_COMMUNITY): Payer: 59

## 2013-05-10 ENCOUNTER — Encounter (HOSPITAL_COMMUNITY): Payer: 59

## 2013-05-13 ENCOUNTER — Encounter (HOSPITAL_COMMUNITY): Payer: 59

## 2013-05-15 ENCOUNTER — Encounter (HOSPITAL_COMMUNITY): Payer: 59

## 2013-05-17 ENCOUNTER — Encounter (HOSPITAL_COMMUNITY)
Admission: RE | Admit: 2013-05-17 | Discharge: 2013-05-17 | Disposition: A | Discharge status: Home / Self Care | Payer: 59 | Source: Ambulatory Visit | Attending: Cardiology | Admitting: Cardiology

## 2013-05-20 ENCOUNTER — Encounter (HOSPITAL_COMMUNITY): Payer: 59

## 2013-05-22 ENCOUNTER — Encounter (HOSPITAL_COMMUNITY)
Admission: RE | Admit: 2013-05-22 | Discharge: 2013-05-22 | Disposition: A | Discharge status: Home / Self Care | Payer: 59 | Source: Ambulatory Visit | Attending: Cardiology | Admitting: Cardiology

## 2013-05-22 DIAGNOSIS — I252 Old myocardial infarction: Secondary | ICD-10-CM | POA: Insufficient documentation

## 2013-05-22 DIAGNOSIS — Z5189 Encounter for other specified aftercare: Secondary | ICD-10-CM | POA: Insufficient documentation

## 2013-05-24 ENCOUNTER — Encounter (HOSPITAL_COMMUNITY)
Admission: RE | Admit: 2013-05-24 | Discharge: 2013-05-24 | Disposition: A | Discharge status: Home / Self Care | Payer: 59 | Source: Ambulatory Visit | Attending: Cardiology | Admitting: Cardiology

## 2013-05-27 ENCOUNTER — Encounter (HOSPITAL_COMMUNITY)
Admission: RE | Admit: 2013-05-27 | Discharge: 2013-05-27 | Disposition: A | Discharge status: Home / Self Care | Payer: 59 | Source: Ambulatory Visit | Attending: Cardiology | Admitting: Cardiology

## 2013-05-29 ENCOUNTER — Encounter (HOSPITAL_COMMUNITY)
Admission: RE | Admit: 2013-05-29 | Discharge: 2013-05-29 | Disposition: A | Discharge status: Home / Self Care | Payer: 59 | Source: Ambulatory Visit | Attending: Cardiology | Admitting: Cardiology

## 2013-05-31 ENCOUNTER — Encounter (HOSPITAL_COMMUNITY): Payer: 59

## 2013-06-03 ENCOUNTER — Emergency Department (HOSPITAL_COMMUNITY): Payer: 59

## 2013-06-03 ENCOUNTER — Encounter (HOSPITAL_COMMUNITY): Payer: Self-pay

## 2013-06-03 ENCOUNTER — Ambulatory Visit (HOSPITAL_COMMUNITY): Admit: 2013-06-03 | Payer: Self-pay | Admitting: Cardiovascular Disease

## 2013-06-03 ENCOUNTER — Telehealth: Payer: Self-pay | Admitting: *Deleted

## 2013-06-03 ENCOUNTER — Encounter (HOSPITAL_COMMUNITY)
Admission: EM | Disposition: A | Discharge status: Home / Self Care | Payer: Self-pay | Source: Home / Self Care | Attending: Cardiology

## 2013-06-03 ENCOUNTER — Inpatient Hospital Stay (HOSPITAL_COMMUNITY)
Admission: EM | Admit: 2013-06-03 | Discharge: 2013-06-04 | DRG: 313 | Disposition: A | Discharge status: Home / Self Care | Payer: 59 | Attending: Cardiology | Admitting: Cardiology

## 2013-06-03 DIAGNOSIS — Z79899 Other long term (current) drug therapy: Secondary | ICD-10-CM

## 2013-06-03 DIAGNOSIS — G43909 Migraine, unspecified, not intractable, without status migrainosus: Secondary | ICD-10-CM | POA: Diagnosis present

## 2013-06-03 DIAGNOSIS — I200 Unstable angina: Secondary | ICD-10-CM

## 2013-06-03 DIAGNOSIS — IMO0001 Reserved for inherently not codable concepts without codable children: Secondary | ICD-10-CM | POA: Diagnosis present

## 2013-06-03 DIAGNOSIS — K219 Gastro-esophageal reflux disease without esophagitis: Secondary | ICD-10-CM | POA: Diagnosis present

## 2013-06-03 DIAGNOSIS — R0789 Other chest pain: Secondary | ICD-10-CM

## 2013-06-03 DIAGNOSIS — M4120 Other idiopathic scoliosis, site unspecified: Secondary | ICD-10-CM | POA: Diagnosis present

## 2013-06-03 DIAGNOSIS — K589 Irritable bowel syndrome without diarrhea: Secondary | ICD-10-CM | POA: Diagnosis present

## 2013-06-03 DIAGNOSIS — I252 Old myocardial infarction: Secondary | ICD-10-CM

## 2013-06-03 DIAGNOSIS — M797 Fibromyalgia: Secondary | ICD-10-CM | POA: Diagnosis present

## 2013-06-03 DIAGNOSIS — E71318 Other disorders of fatty-acid oxidation: Secondary | ICD-10-CM | POA: Diagnosis present

## 2013-06-03 DIAGNOSIS — I2510 Atherosclerotic heart disease of native coronary artery without angina pectoris: Secondary | ICD-10-CM | POA: Diagnosis present

## 2013-06-03 DIAGNOSIS — K5900 Constipation, unspecified: Secondary | ICD-10-CM | POA: Diagnosis present

## 2013-06-03 DIAGNOSIS — R072 Precordial pain: Principal | ICD-10-CM | POA: Diagnosis present

## 2013-06-03 DIAGNOSIS — Z7982 Long term (current) use of aspirin: Secondary | ICD-10-CM

## 2013-06-03 DIAGNOSIS — I440 Atrioventricular block, first degree: Secondary | ICD-10-CM | POA: Diagnosis present

## 2013-06-03 LAB — BASIC METABOLIC PANEL
BUN: 11 mg/dL (ref 6–23)
CO2: 28 mEq/L (ref 19–32)
Chloride: 103 mEq/L (ref 96–112)
Creatinine, Ser: 0.86 mg/dL (ref 0.50–1.10)

## 2013-06-03 LAB — PROTIME-INR: INR: 1.07 (ref 0.00–1.49)

## 2013-06-03 LAB — POCT I-STAT TROPONIN I: Troponin i, poc: 0.01 ng/mL (ref 0.00–0.08)

## 2013-06-03 LAB — CBC WITH DIFFERENTIAL/PLATELET
HCT: 44.8 % (ref 36.0–46.0)
Hemoglobin: 15.1 g/dL — ABNORMAL HIGH (ref 12.0–15.0)
Lymphocytes Relative: 35 % (ref 12–46)
MCHC: 33.7 g/dL (ref 30.0–36.0)
MCV: 89.4 fL (ref 78.0–100.0)
Monocytes Absolute: 0.7 10*3/uL (ref 0.1–1.0)
Monocytes Relative: 10 % (ref 3–12)
Neutro Abs: 3.3 10*3/uL (ref 1.7–7.7)
WBC: 6.5 10*3/uL (ref 4.0–10.5)

## 2013-06-03 LAB — D-DIMER, QUANTITATIVE: D-Dimer, Quant: <0.27 ug/mL-FEU (ref 0.00–0.48)

## 2013-06-03 LAB — HEPARIN LEVEL (UNFRACTIONATED): Heparin Unfractionated: 0.69 IU/mL (ref 0.30–0.70)

## 2013-06-03 SURGERY — LEFT HEART CATHETERIZATION WITH CORONARY ANGIOGRAM
Anesthesia: LOCAL

## 2013-06-03 MED ORDER — ASPIRIN 81 MG PO CHEW: 324.0000 mg | CHEWABLE_TABLET | ORAL | Status: DC

## 2013-06-03 MED ORDER — ASPIRIN EC 81 MG PO TBEC: 81.0000 mg | DELAYED_RELEASE_TABLET | Freq: Every day | ORAL | Status: DC

## 2013-06-03 MED ORDER — HEPARIN (PORCINE) IN NACL 100-0.45 UNIT/ML-% IJ SOLN
INTRAMUSCULAR | Status: AC
  Filled 2013-06-03: qty 250

## 2013-06-03 MED ORDER — FENTANYL CITRATE 0.05 MG/ML IJ SOLN
25.0000 ug | INTRAMUSCULAR | Status: DC | PRN
  Administered 2013-06-03 (×2): 25 ug via INTRAVENOUS
  Filled 2013-06-03 (×2): qty 2

## 2013-06-03 MED ORDER — ASPIRIN EC 81 MG PO TBEC
81.0000 mg | DELAYED_RELEASE_TABLET | Freq: Every day | ORAL | Status: DC
  Administered 2013-06-04: 81 mg via ORAL
  Filled 2013-06-03: qty 1

## 2013-06-03 MED ORDER — ASPIRIN 81 MG PO CHEW
324.0000 mg | CHEWABLE_TABLET | Freq: Once | ORAL | Status: AC
  Administered 2013-06-03: 324 mg via ORAL
  Filled 2013-06-03: qty 4

## 2013-06-03 MED ORDER — ONDANSETRON HCL 4 MG/2ML IJ SOLN: 4.0000 mg | Freq: Four times a day (QID) | INTRAMUSCULAR | Status: DC | PRN

## 2013-06-03 MED ORDER — SODIUM CHLORIDE 0.9 % IJ SOLN: 3.0000 mL | INTRAMUSCULAR | Status: DC | PRN

## 2013-06-03 MED ORDER — HEPARIN (PORCINE) IN NACL 100-0.45 UNIT/ML-% IJ SOLN: 12.0000 [IU]/kg/h | INTRAMUSCULAR | Status: DC

## 2013-06-03 MED ORDER — ACETAMINOPHEN 325 MG PO TABS
650.0000 mg | ORAL_TABLET | ORAL | Status: DC | PRN
  Administered 2013-06-03 – 2013-06-04 (×2): 650 mg via ORAL
  Filled 2013-06-03 (×2): qty 2

## 2013-06-03 MED ORDER — CYCLOSPORINE 0.05 % OP EMUL
1.0000 [drp] | Freq: Two times a day (BID) | OPHTHALMIC | Status: DC
  Administered 2013-06-03 – 2013-06-04 (×2): 1 [drp] via OPHTHALMIC
  Filled 2013-06-03 (×3): qty 1

## 2013-06-03 MED ORDER — HEPARIN (PORCINE) IN NACL 100-0.45 UNIT/ML-% IJ SOLN
850.0000 [IU]/h | INTRAMUSCULAR | Status: DC
  Administered 2013-06-03: 850 [IU]/h via INTRAVENOUS
  Filled 2013-06-03: qty 250

## 2013-06-03 MED ORDER — NITROGLYCERIN 0.4 MG SL SUBL: 0.4000 mg | SUBLINGUAL_TABLET | SUBLINGUAL | Status: DC | PRN

## 2013-06-03 MED ORDER — SODIUM CHLORIDE 0.9 % IJ SOLN
3.0000 mL | Freq: Two times a day (BID) | INTRAMUSCULAR | Status: DC
  Administered 2013-06-03: 3 mL via INTRAVENOUS

## 2013-06-03 MED ORDER — METOPROLOL SUCCINATE ER 25 MG PO TB24
25.0000 mg | ORAL_TABLET | Freq: Every day | ORAL | Status: DC
  Administered 2013-06-03: 25 mg via ORAL
  Filled 2013-06-03 (×2): qty 1

## 2013-06-03 MED ORDER — NITROGLYCERIN 2 % TD OINT
0.5000 [in_us] | TOPICAL_OINTMENT | Freq: Once | TRANSDERMAL | Status: AC
  Administered 2013-06-03: 0.5 [in_us] via TOPICAL
  Filled 2013-06-03: qty 1

## 2013-06-03 MED ORDER — OCUVITE PO TABS
1.0000 | ORAL_TABLET | Freq: Every day | ORAL | Status: DC
  Administered 2013-06-03 – 2013-06-04 (×2): 1 via ORAL
  Filled 2013-06-03 (×2): qty 1

## 2013-06-03 MED ORDER — HEPARIN BOLUS VIA INFUSION
4000.0000 [IU] | Freq: Once | INTRAVENOUS | Status: AC
  Administered 2013-06-03: 4000 [IU] via INTRAVENOUS

## 2013-06-03 MED ORDER — PROMETHAZINE HCL 25 MG/ML IJ SOLN
INTRAMUSCULAR | Status: AC
  Filled 2013-06-03: qty 1

## 2013-06-03 MED ORDER — ONDANSETRON HCL 4 MG/2ML IJ SOLN: 4.0000 mg | Freq: Three times a day (TID) | INTRAMUSCULAR | Status: DC | PRN

## 2013-06-03 MED ORDER — CLOPIDOGREL BISULFATE 75 MG PO TABS
75.0000 mg | ORAL_TABLET | Freq: Every day | ORAL | Status: DC
  Administered 2013-06-04: 75 mg via ORAL
  Filled 2013-06-03: qty 1

## 2013-06-03 MED ORDER — PROMETHAZINE HCL 25 MG/ML IJ SOLN
12.5000 mg | INTRAMUSCULAR | Status: DC | PRN
  Administered 2013-06-03: 12.5 mg via INTRAVENOUS
  Filled 2013-06-03: qty 1

## 2013-06-03 MED ORDER — SODIUM CHLORIDE 0.9 % IV SOLN: 250.0000 mL | INTRAVENOUS | Status: DC | PRN

## 2013-06-03 MED ORDER — ATORVASTATIN CALCIUM 80 MG PO TABS
80.0000 mg | ORAL_TABLET | Freq: Every day | ORAL | Status: DC
  Administered 2013-06-03 – 2013-06-04 (×2): 80 mg via ORAL
  Filled 2013-06-03 (×2): qty 1

## 2013-06-03 MED ORDER — PANTOPRAZOLE SODIUM 40 MG PO TBEC: 40.0000 mg | DELAYED_RELEASE_TABLET | Freq: Every day | ORAL | Status: DC | PRN

## 2013-06-03 MED ORDER — PROMETHAZINE HCL 25 MG/ML IJ SOLN
12.5000 mg | Freq: Once | INTRAMUSCULAR | Status: AC
  Administered 2013-06-03: 12.5 mg via INTRAVENOUS

## 2013-06-03 MED ORDER — ASPIRIN 300 MG RE SUPP: 300.0000 mg | RECTAL | Status: DC

## 2013-06-03 NOTE — Telephone Encounter (Addendum)
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 however did not wait stop to take a nitro, pt advised she knows she should have stopped driving but did not want to,thus pt finally took a nitro at and noted this helped slightly after walking around but pt felt like it was more indigestion and took 2 tablespoons of indigestion medication and went to sleep however pt woke again at 4am with another pain, took nitro again with some relief, noted some left arm pain however this did subside, pt is currently working with a notation of dull aching pain in her chest currently, denies SOB/swelling, notes slight headache, pt advised to call her in Endo at 4545 with instructions once received, pt aware to go to the ED with any worsening sxs, pt also noted her EKG was good this am however her heart attacks have not been detectable via EKG, only through Troponin lab draws, please advise

## 2013-06-03 NOTE — Telephone Encounter (Signed)
Verbal orders given from Dr. Purvis Sheffield for pt to go to the ED to be evaluated, this nurse called pt and advised Dr. Purvis Sheffield instructions to go to the ED, pt understood

## 2013-06-03 NOTE — ED Provider Notes (Addendum)
CSN: [id number]     Arrival date & time 06/03/13  0919 History  This chart was scribed for Vida Roller, MD by Quintella Reichert, ED scribe.  This patient was seen in room APA11/[facility]11 and the patient's care was started at 9:41 AM.   Chief Complaint  Patient presents with  . Chest Pain    The history is provided by the patient. No language interpreter was used.    HPI Comments: Stacey Lawrence is a 62 y.o. female with h/o NSTEMI and CAD who presents to the Emergency Department complaining of constant moderate central CP that began last night.  Pt describes pain as a constant tightness in the center of her chest that does not radiate.  She states it feels similar to her prior MI in April 2014.  Symptoms began while she was driving.  She took 1 nitro tablet and 2 tbsp gas medication which relieved pain for a few hours before it returned "like someone punched me in the chest."  She denies SOB, nausea, vomiting or diaphoresis.  Pain is exacerbated by sitting up and relieved slightly by lying down.  She is not sure whether it is exacerbated by exertion.  Pt takes Plavix, Lopressor and Lipitor which were all started in April after her MI.  She denies h/o DM or HTN.  She does not smoke.  She admits to maternal h/o CHF.   Past Medical History  Diagnosis Date  . PONV (postoperative nausea and vomiting)   . Fibromyalgia   . Migraine headache     a. migraines and cluster;last migraine 3 mon ago  . Joint pain   . Joint swelling   . Scoliosis   . GERD (gastroesophageal reflux disease)     hasn't started taking any meds  . IBS (irritable bowel syndrome)     constipation  . Hx of colonic polyps   . Kidney stones 03/2010  . History of blood transfusion     69yrs ago  . Polycythemia   . CAD (coronary artery disease)     a. 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    Past Surgical History  Procedure Laterality Date  . Abdominal hysterectomy      part. hyst.  . Tubal ligation    . Cholecystectomy  10/28/2002    lap.  . Debridement tennis elbow    . Elbow surgery      golfer's elbow-bilateral  . Knee debridement      right  . Appendectomy      with explor. lap.  . Stapedectomy      right  . Trigger finger release  08/19/2011    Procedure: RELEASE TRIGGER FINGER/A-1 PULLEY;  Surgeon: Oletta Cohn III;  Location: [facility];  Service: Orthopedics;  Laterality: Right;  right middle finger a-1 pulley release with tenosynovectomy  . Tonsillectomy and adenoidectomy    . Diagnostic laparoscopy    . Dilation and curettage of uterus    . Tympanoplasty    . Colonoscopy    . I&d extremity  02/25/2012    Procedure: IRRIGATION AND DEBRIDEMENT EXTREMITY;  Surgeon: Dominica Severin, MD;  Location: [facility] OR;  Service: Orthopedics;  Laterality: Right;  Irrigation and  Debridement and Repair Right Thumb Nerve Laceration and Assoiated Structures   . Laparotomy    . Esophagogastroduodenoscopy  09/10/2012    Procedure: ESOPHAGOGASTRODUODENOSCOPY (EGD);  Surgeon: Malissa Hippo, MD;  [facility]: [facility];  Service: Endoscopy;  Laterality: N/A;  730    Family History  Problem Relation Age of Onset  . Cancer Mother     Deceased with lung CA  . Cancer Father     Deceased with lung CA  . Heart failure Mother     History  Substance Use Topics  . Smoking status: Never Smoker   . Smokeless tobacco: Never Used  . Alcohol Use: Yes     Comment: rarely    OB History   Grav Para Term Preterm Abortions TAB SAB Ect Mult Living                  Review of Systems  All other systems reviewed and are negative.     Allergies  Dilaudid; Morphine and related; and Codeine  Home Medications   No current outpatient prescriptions on  file. BP 120/70  Pulse 66  Temp(Src) 97.5 F (36.4 C) (Oral)  Resp 16  SpO2 99%  Physical Exam  Nursing note and vitals reviewed. Constitutional: She appears well-developed and well-nourished. No distress.  HENT:  Head: Normocephalic and atraumatic.  Mouth/Throat: Oropharynx is clear and moist. No oropharyngeal exudate.  Eyes: Conjunctivae and EOM are normal. Pupils are equal, round, and reactive to light. Right eye exhibits no discharge. Left eye exhibits no discharge. No scleral icterus.  Neck: Normal range of motion. Neck supple. No JVD present. No thyromegaly present.  Cardiovascular: Normal rate, regular rhythm, normal heart sounds and intact distal pulses.  Exam reveals no gallop and no friction rub.   No murmur heard. Pulmonary/Chest: Effort normal and breath sounds normal. No respiratory distress. She has no wheezes. She has no rales.  Abdominal: Soft. Bowel sounds are normal. She exhibits no distension and no mass. There is no tenderness.  Musculoskeletal: Normal range of motion. She exhibits no edema and no tenderness.  Lymphadenopathy:    She has no cervical adenopathy.  Neurological: She is alert. Coordination normal.  Skin: Skin is warm and dry. No rash noted. No erythema.  Psychiatric: She has a normal mood and affect. Her behavior is normal.    ED Course  Procedures (including critical care time)  DIAGNOSTIC STUDIES: Oxygen Saturation is 99% on room air, normal by my interpretation.    COORDINATION OF CARE: 9:46 AM-Discussed treatment plan which includes cardiac workup with pt at bedside and pt agreed to plan.    Labs Review Labs Reviewed  CBC WITH DIFFERENTIAL - Abnormal; Notable for the following:    Hemoglobin 15.1 (*)    All other components within normal limits  BASIC METABOLIC PANEL - Abnormal; Notable for the following:    Glucose, Bld 103 (*)    GFR calc non Af Amer 72 (*)    GFR calc Af Amer 83 (*)    All other components within normal limits   MRSA PCR SCREENING  TROPONIN I  APTT  PROTIME-INR  HEPARIN LEVEL (UNFRACTIONATED)  D-DIMER, QUANTITATIVE  TROPONIN I  TROPONIN I  TROPONIN I  POCT I-STAT TROPONIN I    Imaging Review Dg Chest 2 View  06/03/2013   *RADIOLOGY REPORT*  Clinical Data:   chest pain  CHEST - 2 VIEW  Comparison: 4/30 2014  Findings: No pneumothorax. Lungs clear.  Heart size and pulmonary  vascularity normal.  No effusion.  Visualized bones unremarkable.  IMPRESSION: No acute disease   Original Report Authenticated By: D. Andria Rhein, MD    MDM   1. Unstable angina    Trop neg - pt has increased risk given last cath - ongoing pain this AM - ECG with PRWP, consult cardiology, nitro paste, asa given.  Assessment:  1. CAD with occlusion of a small OM-2  2. 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 EF 55% with focal area of hypokinesis in mid to distal anterolateral wall  Plan/Discussion:  The OM-2 is too small for PCI. Will treat medically with aggressive RF modification. Start Plavix.  ED ECG REPORT  I personally interpreted this EKG   Date: 06/03/2013   Rate: 61  Rhythm: normal sinus rhythm  QRS Axis: normal  Intervals: PR prolonged  ST/T Wave abnormalities: normal  Conduction Disutrbances:first-degree A-V block   Narrative Interpretation:   Old EKG Reviewed: Compared with 01/18/2013, no significant changes other than slight prolongation of the PR interval   Pt has ongoing pain, ECG without ST elevation and trop normal - concern that ongoing pain is ischemic given hx and cath above - d/w Dr. Myrtis Ser of cardiology - agrees with bringing to ICU setting initially.  EMTALA form completed.  CRITICAL CARE Performed by: Vida Roller Total critical care time: 35 Critical care time was exclusive of separately billable procedures and treating other patients. Critical care was necessary to treat or prevent imminent or life-threatening deterioration. Critical care was  time spent personally by me on the following activities: development of treatment plan with patient and/or surrogate as well as nursing, discussions with consultants, evaluation of patient's response to treatment, examination of patient, obtaining history from patient or surrogate, ordering and performing treatments and interventions, ordering and review of laboratory studies, ordering and review of radiographic studies, pulse oximetry and re-evaluation of patient's condition.   I personally performed the services described in this documentation, which was scribed in my presence. The recorded information has been reviewed and is accurate.      Vida Roller, MD 06/03/13 1205  Vida Roller, MD 06/03/13 [phone]

## 2013-06-03 NOTE — Telephone Encounter (Signed)
Noted pt was evaluated in the ED and later admitted

## 2013-06-03 NOTE — H&P (Signed)
Patient ID: Stacey Lawrence MRN: [id number], DOB/AGE: Sep 17, 1951   Admit date: 06/03/2013  Primary Physician: Catalina Pizza, MD Primary Cardiologist: previously R. Dietrich Pates, MD  Pt. 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  Problem List  Past Medical History  Diagnosis Date  . PONV (postoperative nausea and vomiting)   . Fibromyalgia   . Migraine headache     a. migraines and cluster;last migraine 3 mon ago  . Joint pain   . Joint swelling   . Scoliosis   . GERD (gastroesophageal reflux disease)     hasn't started taking any meds  . IBS (irritable bowel syndrome)     constipation  . Hx of colonic polyps   . Kidney stones 03/2010  . History of blood transfusion     9yrs ago  . Polycythemia   . CAD (coronary artery disease)     a. 12/2012 NSTEMI/Cath: LM anomalous, arising in R cusp ant to RCA, otw nl, LAD 60m with bridge, RI nl, LCX nl, OM2 small, subtl occl w/ thrombus distally - felt to be spont dissection, too small for PCI->Med Rx, RCA large, dom, nl, PDA/PD nl, EF 55% w/ focal HK in mid-dist antlat wall.    Past Surgical History  Procedure Laterality Date  . Abdominal hysterectomy      part. hyst.  . Tubal ligation    . Cholecystectomy  10/28/2002    lap.  . Debridement tennis elbow    . Elbow surgery      golfer's elbow-bilateral  . Knee debridement      right  . Appendectomy      with explor. lap.  . Stapedectomy      right  . Trigger finger release  08/19/2011    Procedure: RELEASE TRIGGER FINGER/A-1 PULLEY;  Surgeon: Oletta Cohn III;  Location: [facility];  Service: Orthopedics;  Laterality: Right;  right middle finger a-1 pulley release with tenosynovectomy  . Tonsillectomy and adenoidectomy    . Diagnostic laparoscopy    . Dilation and curettage of uterus    . Tympanoplasty    . Colonoscopy    . I&d extremity  02/25/2012   Procedure: IRRIGATION AND DEBRIDEMENT EXTREMITY;  Surgeon: Dominica Severin, MD;  Location: [facility] OR;  Service: Orthopedics;  Laterality: Right;  Irrigation and Debridement and Repair Right Thumb Nerve Laceration and Assoiated Structures   . Laparotomy    . Esophagogastroduodenoscopy  09/10/2012    Procedure: ESOPHAGOGASTRODUODENOSCOPY (EGD);  Surgeon: Malissa Hippo, MD;  Location: [facility];  Service: Endoscopy;  Laterality: N/A;  730    Allergies  Allergies  Allergen Reactions  . Dilaudid [Hydromorphone Hcl] Other (See Comments)    Resp. arrest  . Morphine And Related Nausea And Vomiting  . Codeine Nausea And Vomiting and Rash   HPI  62 y/o female with the above problem list.  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  This was felt to be 2/2 spont coronary artery dissection and was managed medically 2/2 small caliber of the OM2.  She has done well w/o recurrent Ss but last night, while driving back from [place] @ approximately 9:30 PM, she began to experience substernal chest pressure/heaviness w/o associated Ss.  This was worse  with deep breathing and sitting up.  At one point, she stopped a a Sonic and got a cherry Coke from the drive-thru and felt that Ss eased up some but then quickly returned.  She continued on her drive home to [place] and upon arriving home around midnight, she took sl NTG with partial relief ("enough so that [she] could get to sleep.").  She was re-awaked by chest pain @ ~ 4AM, when it recurred and was more sharp and severe (9-10/10).  This abated relatively quickly with sitting up but she was never quite able to get back to sleep.  She went to work later in the morning and called into our office with more mild, yet ongoing, discomfort.  She was advised to present to the [facility] ED where ECG was nonacute and initial troponin was normal.  She was  treated with NTP, which has resulted in headache and nausea.  Heparin was started and she was transferred to [facility] for further evaluation.  She continues to c/o mild chest pressure, headache, and nausea.  Home Medications  Prior to Admission medications   Medication Sig Start Date End Date Taking? Authorizing Provider  aspirin EC 81 MG EC tablet Take 1 tablet (81 mg total) by mouth daily. 01/18/13  Yes Ok Anis, NP  atorvastatin (LIPITOR) 80 MG tablet Take 1 tablet (80 mg total) by mouth daily at 6 PM. 01/18/13  Yes Ok Anis, NP  beta carotene w/minerals (OCUVITE) tablet Take 1 tablet by mouth daily.   Yes Historical Provider, MD  clopidogrel (PLAVIX) 75 MG tablet Take 1 tablet (75 mg total) by mouth daily with breakfast. 01/18/13  Yes Ok Anis, NP  cycloSPORINE (RESTASIS) 0.05 % ophthalmic emulsion Place 1 drop into both eyes 2 (two) times daily.   Yes Historical Provider, MD  ibuprofen (ADVIL,MOTRIN) 200 MG tablet Take 400 mg by mouth every 6 (six) hours as needed for pain.   Yes Historical Provider, MD  metoprolol succinate (TOPROL-XL) 25 MG 24 hr tablet Take 1 tablet (25 mg total) by mouth at bedtime. 02/15/13  Yes Jodelle Gross, NP  nitroGLYCERIN (NITROSTAT) 0.4 MG SL tablet Place 1 tablet (0.4 mg total) under the tongue every 5 (five) minutes as needed for chest pain. 01/18/13  Yes Ok Anis, NP  pantoprazole (PROTONIX) 40 MG tablet Take 40 mg by mouth daily as needed (Heartburn). 09/10/12  Yes Malissa Hippo, MD   Family History  Family History  Problem Relation Age of Onset  . Cancer Mother     Deceased with lung CA  . Cancer Father     Deceased with lung CA  . Heart failure Mother    Social History  History   Social History  . Marital Status: Married    Spouse Name: N/A    Number of Children: N/A  . Years of Education: N/A   Occupational History  . Not on file.   Social History Main Topics  . Smoking status: Never Smoker   .  Smokeless tobacco: Never Used  . Alcohol Use: Yes     Comment: rarely  . Drug Use: No  . Sexual Activity: Yes    Birth Control/ Protection: Surgical   Other Topics Concern  . Not on file   Social History Narrative   Lives in [place] with husband.  Grown children.  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    Review of Systems General:  No chills, fever, night sweats  or weight changes.  Cardiovascular:  +++ chest pain, no dyspnea on exertion, edema, orthopnea, palpitations, paroxysmal nocturnal dyspnea. Dermatological: No rash, lesions/masses Respiratory: No cough, dyspnea Urologic: No hematuria, dysuria Abdominal:   +++ nausea, chronic constipation.  No vomiting, diarrhea, bright red blood per rectum, melena, or hematemesis Neurologic:  No visual changes, wkns, changes in mental status. All other systems reviewed and are otherwise negative except as noted above.  Physical Exam  Blood pressure 119/52, pulse 59, temperature 97.5 F (36.4 C), temperature source Oral, resp. rate 14, height 5' 8.11" (1.73 m), SpO2 98.00%.  General: Pleasant, NAD Psych: flat affect. Neuro: Alert and oriented X 3. Moves all extremities spontaneously. HEENT: Normal  Neck: Supple without bruits or JVD. Lungs:  Resp regular and unlabored, CTA. Heart: RRR no s3, s4, or murmurs. Abdomen: Soft, non-tender, non-distended, BS + x 4.  Extremities: No clubbing, cyanosis or edema. DP/PT/Radials 2+ and equal bilaterally.  Labs   Recent Labs  06/03/13 0957  TROPONINI <0.30   Lab Results  Component Value Date   WBC 6.5 06/03/2013   HGB 15.1* 06/03/2013   HCT 44.8 06/03/2013   MCV 89.4 06/03/2013   PLT 183 06/03/2013     Recent Labs Lab 06/03/13 0957  NA 140  K 3.9  CL 103  CO2 28  BUN 11  CREATININE 0.86  CALCIUM 9.9  GLUCOSE 103*    Radiology/Studies  Dg Chest 2 View  06/03/2013   *RADIOLOGY REPORT*  Clinical Data:   chest pain  CHEST - 2 VIEW  Comparison: 4/30 2014   Findings: No pneumothorax. Lungs clear.  Heart size and pulmonary vascularity normal.  No effusion.  Visualized bones unremarkable.  IMPRESSION: No acute disease   Original Report Authenticated By: D. Hassell III, MD   ECG  Rsr, 61, 1st deg avb, no acute st/t changes.  ASSESSMENT AND PLAN  1.  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  Has been having midsternal chest pain since last night @ approx 9:30 PM, with pleuritic component.  This began after driving back from [place] (5 hr round trip).   Despite prolonged duration of pain, initial troponin is nl and ECG is non-acute.  Admit and cycle CE.  Check D-dimer.  Cont heparin.  No ntg in setting of progressive headache and nausea.  Cont asa, statin, bb, plavix, and ppi.  If she rules in or pain unrelenting, will need to pursue cath.  2.  GERD:  Cont ppi.  3.  Migraine H/A's:  Exacerbated by NTG.  D/C ntp started in ED.  Prn Fentanyl/phenergan.  Signed, Nicolasa Ducking, NP 06/03/2013, 2:30 PM Patient seen and examined. I agree with the assessment and plan as detailed above. See also my additional thoughts below.   The patient is stable at this time. Because she had a small spontaneous dissection previously, we need to be sure that her current symptoms are not representing cardiac ischemia. At this point we are not seeing any proof of ischemia. We will plan to follow her troponins and her overall symptoms. At this time there is no plan for catheterization.  Willa Rough, MD, [facility] 06/03/2013 3:23 PM

## 2013-06-03 NOTE — ED Notes (Signed)
Patient state she has a migraine and needs something as soon as possible for pain. RN made aware.

## 2013-06-03 NOTE — Progress Notes (Signed)
ANTICOAGULATION CONSULT NOTE - Follow Up Consult  Pharmacy Consult for Heparin Indication: chest pain/ACS  Allergies  Allergen Reactions  . Dilaudid [Hydromorphone Hcl] Other (See Comments)    Resp. arrest  . Morphine And Related Nausea And Vomiting  . Codeine Nausea And Vomiting and Rash    Patient Measurements: Height: 5\' 8"  (172.7 cm) Weight: 153 lb 7 oz (69.6 kg) IBW/kg (Calculated) : 63.9 Heparin Dosing Weight: 69.6 kg  Vital Signs: Temp: 98.6 F (37 C) (09/15 2000) Temp src: Oral (09/15 2000) BP: 91/50 mmHg (09/15 2200) Pulse Rate: 80 (09/15 2210)  Labs:  Recent Labs  06/03/13 0957 06/03/13 1643 06/03/13 2140  HGB 15.1*  --   --   HCT 44.8  --   --   PLT 183  --   --   APTT 29  --   --   LABPROT 13.7  --   --   INR 1.07  --   --   HEPARINUNFRC  --   --  0.69  CREATININE 0.86  --   --   TROPONINI <0.30 <0.30  --     Estimated Creatinine Clearance: 70.2 ml/min (by C-G formula based on Cr of 0.86).  Assessment:  Heparin drip begun at [facility] ED today ~1pm with 4000 units IV bolus, then ~12 units/kg/hr = 850 units/hr.  Heparin level is upper-range therapeutic tonight.   Goal of Therapy:  Heparin level 0.3-0.7 units/ml Monitor platelets by anticoagulation protocol: Yes   Plan:   Continue heparin drip at 850 units/hr.  Next heparin level and CBC in am.  Dennie Fetters, RPh Pager: [phone] 06/03/2013,10:30 PM

## 2013-06-03 NOTE — ED Notes (Signed)
Pt reports hx of MI.  Pt reports coming into work today with CP that started last night.  Pt reports taking 1 nitro tablet last night with relief, but woke at 3am with pain again.  Pt reports some jaw pain but denies sob, n/v, diaphoresis.

## 2013-06-03 NOTE — Care Management Note (Signed)
    Page 1 of 1   06/03/2013     2:41:58 PM   CARE MANAGEMENT NOTE 06/03/2013  Patient:  Stacey Lawrence, Stacey Lawrence   Account Number:  [id number]  Date Initiated:  06/03/2013  Documentation initiated by:  Junius Creamer  Subjective/Objective Assessment:   adm w angina     Action/Plan:   lives w husband, pcp dr Ian Malkin hall   Anticipated DC Date:     Anticipated DC Plan:  HOME/SELF CARE      DC Planning Services  CM consult      Choice offered to / List presented to:             Status of service:   Medicare Important Message given?   (If response is "NO", the following Medicare IM given date fields will be blank) Date Medicare IM given:   Date Additional Medicare IM given:    Discharge Disposition:  HOME/SELF CARE  Per UR Regulation:  Reviewed for med. necessity/level of care/duration of stay  If discussed at Long Length of Stay Meetings, dates discussed:    Comments:

## 2013-06-04 ENCOUNTER — Inpatient Hospital Stay (HOSPITAL_COMMUNITY): Payer: 59

## 2013-06-04 ENCOUNTER — Encounter (HOSPITAL_COMMUNITY): Payer: Self-pay | Admitting: Nurse Practitioner

## 2013-06-04 DIAGNOSIS — R079 Chest pain, unspecified: Secondary | ICD-10-CM

## 2013-06-04 DIAGNOSIS — R0789 Other chest pain: Secondary | ICD-10-CM

## 2013-06-04 LAB — CBC
Hemoglobin: 15 g/dL (ref 12.0–15.0)
MCH: 30 pg (ref 26.0–34.0)
MCV: 89.2 fL (ref 78.0–100.0)
Platelets: 171 10*3/uL (ref 150–400)
RBC: 5 MIL/uL (ref 3.87–5.11)
WBC: 7 10*3/uL (ref 4.0–10.5)

## 2013-06-04 LAB — LIPID PANEL
Cholesterol: 122 mg/dL (ref 0–200)
HDL: 68 mg/dL (ref >39)
Total CHOL/HDL Ratio: 1.8 RATIO
VLDL: 11 mg/dL (ref 0–40)

## 2013-06-04 LAB — TROPONIN I: Troponin I: <0.3 ng/mL (ref <0.30)

## 2013-06-04 MED ORDER — TECHNETIUM TC 99M SESTAMIBI GENERIC - CARDIOLITE
10.0000 | Freq: Once | INTRAVENOUS | Status: AC | PRN
  Administered 2013-06-04: 10 via INTRAVENOUS

## 2013-06-04 MED ORDER — TECHNETIUM TC 99M SESTAMIBI GENERIC - CARDIOLITE
30.0000 | Freq: Once | INTRAVENOUS | Status: AC | PRN
  Administered 2013-06-04: 30 via INTRAVENOUS

## 2013-06-04 MED ORDER — REGADENOSON 0.4 MG/5ML IV SOLN
0.4000 mg | Freq: Once | INTRAVENOUS | Status: AC
  Administered 2013-06-04: 0.4 mg via INTRAVENOUS
  Filled 2013-06-04: qty 5

## 2013-06-04 MED ORDER — REGADENOSON 0.4 MG/5ML IV SOLN
INTRAVENOUS | Status: AC
  Administered 2013-06-04: 0.4 mg via INTRAVENOUS
  Filled 2013-06-04: qty 5

## 2013-06-04 MED ORDER — BIOTENE DRY MOUTH MT LIQD
15.0000 mL | Freq: Two times a day (BID) | OROMUCOSAL | Status: DC
  Administered 2013-06-04: 15 mL via OROMUCOSAL

## 2013-06-04 NOTE — Progress Notes (Addendum)
RN discharged patient via wheelchair at 1630. Patients husband had car waiting at main entrance and drove patient home. Patients belongings, AVS, and script for one day out of work were sent with husband when he went to drive car to front of building.

## 2013-06-04 NOTE — Discharge Summary (Signed)
Patient ID: Stacey Lawrence,  MRN: [id number], DOB/AGE: [birth year] 62 y.o.  Admit date: 06/03/2013 Discharge date: 06/04/2013  Primary Care Provider: Margo [facility] Primary Cardiologist: Formerly R. Dietrich Pates, MD -->J. Branch, MD  Discharge Diagnoses Principal Problem:   Midsternal chest pain  **s/p low-risk cardiolite this admission.  Active Problems:   CAD with h/o spontaneous coronary artery dissection   Fibromyalgia   GERD (gastroesophageal reflux disease)   Migraine headache  Allergies Allergies  Allergen Reactions  . Dilaudid [Hydromorphone Hcl] Other (See Comments)    Resp. arrest  . Morphine And Related Nausea And Vomiting  . Codeine Nausea And Vomiting and Rash   Procedures  Lexiscan Cardiolite 9.16.2014  The LV ejection fraction is 77%. Wall motion is vigorous.  There are no wall motion abnormalities.  Impression : Low risk study. Old small inferolateral scar. No ischemia. Normal LV systolic function. _____________   History of Present Illness  62 year old female with prior history of non-ST segment elevation myocardial infarction in April 2014, secondary to spontaneous coronary artery dissection of a small second obtuse marginal branch.  She was medically managed. She did well post MI and was in her usual state of health until the evening prior to admission when while driving back from [facility], she developed substernal chest pressure and heaviness it became worse with deep breathing and sitting up. When she finally returned home, she took sublingual nitroglycerin tablets with just enough relief that she was able to fall off to sleep. Approximately 4 AM on the morning of admission, she awoke with sharp pain in her chest causing her to sit up abruptly but this resolved rather quickly. She was unable to fall back to sleep at that point she went on to work and continued to have chest discomfort and eventually called our office and was advised to present to  the emergency department. She presented to the [facility] emergency department where ECG was nonacute and initial troponin was normal. She was treated with nitroglycerin paste which resulted in headache and nausea. Heparin was also started and she was transferred to [facility] for further evaluation.   Hospital Course  Upon arrival to [facility], she continued to complain of chest pressure, headache, and nausea.  Nitroglycerin paste was discontinued. Heparin was maintained until the patient ruled out for myocardia infarction, which she did. This morning, she had resolution of chest discomfort as well as headache and nausea. In light of negative cardiac markers, decision was made to pursue noninvasive lexiscan Cardiolite. This was performed this morning and showed an EF of 77% with an old, small inferolateral scar and no evidence of acute ischemia. In light of this finding, patient will be discharged home today in good condition.  Discharge Vitals Blood pressure 101/44, pulse 68, temperature 97.6 F (36.4 C), temperature source Oral, resp. rate 11, height 5\' 8"  (1.727 m), weight 153 lb 7 oz (69.6 kg), SpO2 100.00%.  Filed Weights   06/03/13 1650 06/04/13 0600  Weight: 153 lb 7 oz (69.6 kg) 153 lb 7 oz (69.6 kg)   Labs  CBC  Recent Labs  06/03/13 0957 06/04/13 0440  WBC 6.5 7.0  NEUTROABS 3.3  --   HGB 15.1* 15.0  HCT 44.8 44.6  MCV 89.4 89.2  PLT 183 171   Basic Metabolic Panel  Recent Labs  06/03/13 0957  NA 140  K 3.9  CL 103  CO2 28  GLUCOSE 103*  BUN 11  CREATININE 0.86  CALCIUM 9.9  Cardiac Enzymes  Recent Labs  06/03/13 1643 06/03/13 2140 06/04/13 0440  TROPONINI <0.30 <0.30 <0.30   D-Dimer  Recent Labs  06/03/13 1630  DDIMER <0.27   Fasting Lipid Panel  Recent Labs  06/04/13 0440  CHOL 122  HDL 68  LDLCALC 43  TRIG 54  CHOLHDL 1.8   Disposition  Pt is being discharged home today in good condition.  Follow-up Plans &  Appointments  Follow-up Information   Follow up with Dina Rich, MD On 06/26/2013. (9:00 AM)    Contact information:   Architectural technologist [facility]) [address] [place] [address] [phone]      Discharge Medications    Medication List         aspirin 81 MG EC tablet  Take 1 tablet (81 mg total) by mouth daily.     atorvastatin 80 MG tablet  Commonly known as:  LIPITOR  Take 1 tablet (80 mg total) by mouth daily at 6 PM.     beta carotene w/minerals tablet  Take 1 tablet by mouth daily.     clopidogrel 75 MG tablet  Commonly known as:  PLAVIX  Take 1 tablet (75 mg total) by mouth daily with breakfast.     cycloSPORINE 0.05 % ophthalmic emulsion  Commonly known as:  RESTASIS  Place 1 drop into both eyes 2 (two) times daily.     ibuprofen 200 MG tablet  Commonly known as:  ADVIL,MOTRIN  Take 400 mg by mouth every 6 (six) hours as needed for pain.     metoprolol succinate 25 MG 24 hr tablet  Commonly known as:  TOPROL-XL  Take 1 tablet (25 mg total) by mouth at bedtime.     nitroGLYCERIN 0.4 MG SL tablet  Commonly known as:  NITROSTAT  Place 1 tablet (0.4 mg total) under the tongue every 5 (five) minutes as needed for chest pain.     pantoprazole 40 MG tablet  Commonly known as:  PROTONIX  Take 40 mg by mouth daily as needed (Heartburn).       Outstanding Labs/Studies  None  Duration of Discharge Encounter   Greater than 30 minutes including physician time.  Signed, Nicolasa Ducking NP 06/04/2013, 2:46 PM

## 2013-06-04 NOTE — Progress Notes (Signed)
ANTICOAGULATION CONSULT NOTE - Follow Up Consult  Pharmacy Consult for Heparin Indication: chest pain/ACS  Allergies  Allergen Reactions  . Dilaudid [Hydromorphone Hcl] Other (See Comments)    Resp. arrest  . Morphine And Related Nausea And Vomiting  . Codeine Nausea And Vomiting and Rash    Patient Measurements: Height: 5\' 8"  (172.7 cm) Weight: 153 lb 7 oz (69.6 kg) IBW/kg (Calculated) : 63.9 Heparin Dosing Weight: 69.6 kg  Vital Signs: Temp: 98.3 F (36.8 C) (09/16 0400) Temp src: Oral (09/16 0400) BP: 97/53 mmHg (09/16 0600) Pulse Rate: 49 (09/16 0600)  Labs:  Recent Labs  06/03/13 0957 06/03/13 1643 06/03/13 2140 06/04/13 0440  HGB 15.1*  --   --  15.0  HCT 44.8  --   --  44.6  PLT 183  --   --  171  APTT 29  --   --   --   LABPROT 13.7  --   --   --   INR 1.07  --   --   --   HEPARINUNFRC  --   --  0.69 0.64  CREATININE 0.86  --   --   --   TROPONINI <0.30 <0.30 <0.30 <0.30    Estimated Creatinine Clearance: 70.2 ml/min (by C-G formula based on Cr of 0.86).  Assessment: 62 yo female here with CP and history of NSTEMI for r/o ACS on heparin and noted at goal (HL=0.64). Noted no current plans for cath  Goal of Therapy:  Heparin level 0.3-0.7 units/ml Monitor platelets by anticoagulation protocol: Yes   Plan:  -No heparin changes needed -Daily heparin level and CBC

## 2013-06-04 NOTE — Progress Notes (Signed)
RN discussed discharge instructions with patient. All questions answered. Patient and RN signed AVS. Patients husband present during instructions.

## 2013-06-04 NOTE — Progress Notes (Signed)
Patient ID: Stacey Lawrence, female   DOB: [birth year], 62 y.o.   MRN: [id number]    Subjective:  Denies SSCP, palpitations or Dyspnea   Objective:  Filed Vitals:   06/04/13 0300 06/04/13 0400 06/04/13 0500 06/04/13 0600  BP: 108/40 81/36 99/55  97/53  Pulse: 61 53 47 49  Temp:  98.3 F (36.8 C)    TempSrc:  Oral    Resp: 17 15 14 15   Height:      Weight:    153 lb 7 oz (69.6 kg)  SpO2: 98% 95% 99% 99%    Intake/Output from previous day:  Intake/Output Summary (Last 24 hours) at 06/04/13 0803 Last data filed at 06/04/13 0600  Gross per 24 hour  Intake  467.5 ml  Output    775 ml  Net -307.5 ml    Physical Exam: Affect appropriate Healthy:  appears stated age HEENT: normal Neck supple with no adenopathy JVP normal no bruits no thyromegaly Lungs clear with no wheezing and good diaphragmatic motion Heart:  S1/S2 no murmur, no rub, gallop or click PMI normal Abdomen: benighn, BS positve, no tenderness, no AAA no bruit.  No HSM or HJR Distal pulses intact with no bruits No edema Neuro non-focal Skin warm and dry No muscular weakness   Lab Results: Basic Metabolic Panel:  Recent Labs  40/98/11 0957  NA 140  K 3.9  CL 103  CO2 28  GLUCOSE 103*  BUN 11  CREATININE 0.86  CALCIUM 9.9   CBC:  Recent Labs  06/03/13 0957 06/04/13 0440  WBC 6.5 7.0  NEUTROABS 3.3  --   HGB 15.1* 15.0  HCT 44.8 44.6  MCV 89.4 89.2  PLT 183 171   Cardiac Enzymes:  Recent Labs  06/03/13 1643 06/03/13 2140 06/04/13 0440  TROPONINI <0.30 <0.30 <0.30   D-Dimer:  Recent Labs  06/03/13 1630  DDIMER <0.27  Fasting Lipid Panel:  Recent Labs  06/04/13 0440  CHOL 122  HDL 68  LDLCALC 43  TRIG 54  CHOLHDL 1.8    Imaging: Dg Chest 2 View  06/03/2013   *RADIOLOGY REPORT*  Clinical Data:   chest pain  CHEST - 2 VIEW  Comparison: 4/30 2014  Findings: No pneumothorax. Lungs clear.  Heart size and pulmonary vascularity normal.  No effusion.  Visualized bones  unremarkable.  IMPRESSION: No acute disease   Original Report Authenticated By: D. Andria Rhein, MD    Cardiac Studies:  ECG:  SR rate 47 normal    Telemetry:  NSR no arrhythmia   Echo:   Medications:   . antiseptic oral rinse  15 mL Mouth Rinse BID  . aspirin EC  81 mg Oral Daily  . atorvastatin  80 mg Oral q1800  . beta carotene w/minerals  1 tablet Oral Daily  . clopidogrel  75 mg Oral Q breakfast  . cycloSPORINE  1 drop Both Eyes BID  . metoprolol succinate  25 mg Oral QHS  . sodium chloride  3 mL Intravenous Q12H       Assessment/Plan:  Chest Pain:  R/O with normal ECG  Not clear that pain is cardiac CXR and d dimer normal.  D/C heparin and nitro (headache)  Lexiscan myovue today History of small vessel disection and coronary anomaly with last cath just done 4/14 Chol:  Continue statin.    Charlton Haws 06/04/2013, 8:03 AM

## 2013-06-26 ENCOUNTER — Encounter: Payer: 59 | Admitting: Cardiology

## 2013-07-03 ENCOUNTER — Ambulatory Visit (INDEPENDENT_AMBULATORY_CARE_PROVIDER_SITE_OTHER): Payer: 59 | Admitting: Cardiovascular Disease

## 2013-07-03 ENCOUNTER — Encounter: Payer: Self-pay | Admitting: Cardiovascular Disease

## 2013-07-03 VITALS — BP 99/59 | HR 64 | Ht 68.0 in | Wt 155.2 lb

## 2013-07-03 DIAGNOSIS — I2510 Atherosclerotic heart disease of native coronary artery without angina pectoris: Secondary | ICD-10-CM

## 2013-07-03 DIAGNOSIS — R5381 Other malaise: Secondary | ICD-10-CM

## 2013-07-03 DIAGNOSIS — R5383 Other fatigue: Secondary | ICD-10-CM

## 2013-07-03 DIAGNOSIS — R079 Chest pain, unspecified: Secondary | ICD-10-CM

## 2013-07-03 MED ORDER — RANOLAZINE ER 500 MG PO TB12: 500.0000 mg | ORAL_TABLET | Freq: Two times a day (BID) | ORAL | Status: DC

## 2013-07-03 MED ORDER — NITROGLYCERIN 0.4 MG SL SUBL: 0.4000 mg | SUBLINGUAL_TABLET | SUBLINGUAL | Status: DC | PRN

## 2013-07-03 NOTE — Progress Notes (Signed)
Patient ID: Stacey Lawrence, female   DOB: Feb 04, 1951, 62 y.o.   MRN: [id number]      SUBJECTIVE: [name] is a 62 year old female with a prior history of non-ST segment elevation myocardial infarction in April 2014, secondary to spontaneous coronary artery dissection of a small second obtuse marginal branch, which was medically managed. She was recently admitted to [facility] and then transferred to [facility] for chest pain. She ruled out for an ACS and underwent a Lexiscan Cardiolite stress test which revealed a small area of inferolateral scar with no evidence of ischemia, EF 77%.  A relatively recent echocardiogram revealed the following: - Left ventricle: The cavity size was at the upper limits of normal. Wall thickness was normal. Systolic function was normal. The estimated ejection fraction was in the range of 55% to 60%. Akinesis of the distal inferolateral myocardium. - Right ventricle: The cavity size was mildly dilated. Wall thickness was normal.  She is now off of beta blockers due to hypotension. She says her BP normally runs 90/60 mmHg. 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 However, fairly recently, she had another bout of chest pain for which she took 3 SL nitro and the pain subsided after 7 hours. It also caused her to experience a bad headache, and she has a h/o migraines. She also has Raynaud's phenomenon and fibromyalgia.  She denies shortness of breath, lightheadedness, leg swelling, and palpitations. She does feel fatigued more and hopes the cessation of metoprolol will help. She denies snoring or a h/o sleep apnea.  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      Allergies  Allergen Reactions  . Dilaudid [Hydromorphone Hcl] Other (See Comments)    Resp. arrest  . Morphine And Related Nausea And Vomiting  . Codeine Nausea And Vomiting and Rash     Current Outpatient Prescriptions  Medication Sig Dispense Refill  . aspirin EC 81 MG EC tablet Take 1 tablet (81 mg total) by mouth daily.      Marland Kitchen atorvastatin (LIPITOR) 80 MG tablet Take 1 tablet (80 mg total) by mouth daily at 6 PM.  30 tablet  6  . beta carotene w/minerals (OCUVITE) tablet Take 1 tablet by mouth daily.      . clopidogrel (PLAVIX) 75 MG tablet Take 1 tablet (75 mg total) by mouth daily with breakfast.  30 tablet  6  . cycloSPORINE (RESTASIS) 0.05 % ophthalmic emulsion Place 1 drop into both eyes 2 (two) times daily.      Marland Kitchen ibuprofen (ADVIL,MOTRIN) 200 MG tablet Take 400 mg by mouth every 6 (six) hours as needed for pain.      . metoprolol tartrate (LOPRESSOR) 25 MG tablet Take 25 mg by mouth daily.      . nitroGLYCERIN (NITROSTAT) 0.4 MG SL tablet Place 1 tablet (0.4 mg total) under the tongue every 5 (five) minutes as needed for chest pain.  90 tablet  0  . pantoprazole (PROTONIX) 40 MG tablet Take 40 mg by mouth daily as needed (Heartburn).       No current facility-administered medications for this visit.    Past Medical History  Diagnosis Date  . PONV (postoperative nausea and vomiting)   . Fibromyalgia   . Migraine headache     a. migraines and cluster;last migraine 3 mon ago  . Joint pain   .  Joint swelling   . Scoliosis   . GERD (gastroesophageal reflux disease)     hasn't started taking any meds  . IBS (irritable bowel syndrome)     constipation  . Hx of colonic polyps   . Kidney stones 03/2010  . History of blood transfusion     13yrs ago  . Polycythemia   . CAD (coronary artery disease)     a. 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 05/2013 Lexi CL: EF 77%, old small inferolat scar, no ischemia.    Past  Surgical History  Procedure Laterality Date  . Abdominal hysterectomy      part. hyst.  . Tubal ligation    . Cholecystectomy  10/28/2002    lap.  . Debridement tennis elbow    . Elbow surgery      golfer's elbow-bilateral  . Knee debridement      right  . Appendectomy      with explor. lap.  . Stapedectomy      right  . Trigger finger release  08/19/2011    Procedure: RELEASE TRIGGER FINGER/A-1 PULLEY;  Surgeon: Oletta Cohn III;  Location: [facility];  Service: Orthopedics;  Laterality: Right;  right middle finger a-1 pulley release with tenosynovectomy  . Tonsillectomy and adenoidectomy    . Diagnostic laparoscopy    . Dilation and curettage of uterus    . Tympanoplasty    . Colonoscopy    . I&d extremity  02/25/2012    Procedure: IRRIGATION AND DEBRIDEMENT EXTREMITY;  Surgeon: Dominica Severin, MD;  Location: [facility] OR;  Service: Orthopedics;  Laterality: Right;  Irrigation and Debridement and Repair Right Thumb Nerve Laceration and Assoiated Structures   . Laparotomy    . Esophagogastroduodenoscopy  09/10/2012    Procedure: ESOPHAGOGASTRODUODENOSCOPY (EGD);  Surgeon: Malissa Hippo, MD;  Location: [facility];  Service: Endoscopy;  Laterality: N/A;  730    History   Social History  . Marital Status: Married    Spouse Name: N/A    Number of Children: N/A  . Years of Education: N/A   Occupational History  . Not on file.   Social History Main Topics  . Smoking status: Never Smoker   . Smokeless tobacco: Never Used  . Alcohol Use: Yes     Comment: rarely  . Drug Use: No  . Sexual Activity: Yes    Birth Control/ Protection: Surgical   Other Topics Concern  . Not on file   Social History Narrative   Lives in [place] with husband.  Grown children.  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     Filed Vitals:   07/03/13 1515  BP: 99/59  Pulse: 64  Height: 5\' 8"  (1.727 m)  Weight: 155 lb 4 oz (70.421 kg)    PHYSICAL  EXAM General: NAD Neck: No JVD, no thyromegaly or thyroid nodule.  Lungs: Clear to auscultation bilaterally with normal respiratory effort. CV: Nondisplaced PMI.  Heart regular S1/S2, no S3/S4, no murmur.  No peripheral edema.  No carotid bruit.  Normal pedal pulses.  Abdomen: Soft, nontender, no hepatosplenomegaly, no distention.  Neurologic: Alert and oriented x 3.  Psych: Normal affect. Extremities: No  clubbing or cyanosis.   ECG: reviewed and available in electronic records.      ASSESSMENT AND PLAN: 1. CAD: she continues to experience chest pain about once every 2 weeks, and does not tolerate nitrates well, due to her h/o migraines. I will prescribe Ranexa 500 mg bid to see if this helps to alleviate her symptoms. She is off of beta blockers due to hypotension. Continue ASA, Plavix, and high-dose Lipitor.   Prentice Docker, M.D., F.A.C.C.

## 2013-07-03 NOTE — Patient Instructions (Addendum)
Your physician recommends that you schedule a follow-up appointment in: DECEMBER WITH Dr. Purvis Sheffield   Your physician has recommended you make the following change in your medication:   1) START RANEXA 500MG  TWICE DAILY

## 2013-07-17 ENCOUNTER — Other Ambulatory Visit: Payer: Self-pay | Admitting: Obstetrics and Gynecology

## 2013-07-17 DIAGNOSIS — Z139 Encounter for screening, unspecified: Secondary | ICD-10-CM

## 2013-08-12 ENCOUNTER — Ambulatory Visit (HOSPITAL_COMMUNITY)
Admission: RE | Admit: 2013-08-12 | Discharge: 2013-08-12 | Disposition: A | Discharge status: Home / Self Care | Payer: 59 | Source: Ambulatory Visit | Attending: Obstetrics and Gynecology | Admitting: Obstetrics and Gynecology

## 2013-08-12 DIAGNOSIS — Z1231 Encounter for screening mammogram for malignant neoplasm of breast: Secondary | ICD-10-CM | POA: Insufficient documentation

## 2013-08-12 DIAGNOSIS — Z139 Encounter for screening, unspecified: Secondary | ICD-10-CM

## 2013-08-21 ENCOUNTER — Other Ambulatory Visit: Payer: Self-pay | Admitting: Nurse Practitioner

## 2013-08-29 ENCOUNTER — Ambulatory Visit (INDEPENDENT_AMBULATORY_CARE_PROVIDER_SITE_OTHER): Payer: 59 | Admitting: Cardiovascular Disease

## 2013-08-29 ENCOUNTER — Encounter: Payer: Self-pay | Admitting: Cardiovascular Disease

## 2013-08-29 VITALS — BP 104/57 | HR 82 | Ht 68.0 in | Wt 154.0 lb

## 2013-08-29 DIAGNOSIS — R079 Chest pain, unspecified: Secondary | ICD-10-CM

## 2013-08-29 DIAGNOSIS — I2510 Atherosclerotic heart disease of native coronary artery without angina pectoris: Secondary | ICD-10-CM

## 2013-08-29 MED ORDER — ATORVASTATIN CALCIUM 80 MG PO TABS: 80.0000 mg | ORAL_TABLET | Freq: Every day | ORAL | Status: DC

## 2013-08-29 MED ORDER — CLOPIDOGREL BISULFATE 75 MG PO TABS: 75.0000 mg | ORAL_TABLET | Freq: Every day | ORAL | Status: DC

## 2013-08-29 NOTE — Patient Instructions (Addendum)
Your physician wants you to follow-up in: 6 months  You will receive a reminder letter in the mail two months in advance. If you don't receive a letter, please call our office to schedule the follow-up appointment.  Your physician recommends that you continue on your current medications as directed. Please refer to the Current Medication list given to you today.  

## 2013-08-29 NOTE — Progress Notes (Signed)
Patient ID: Stacey Lawrence, female   DOB: [birth year], 62 y.o.   MRN: [id number]      SUBJECTIVE: [name] is a 62 year old female with a prior history of non-ST segment elevation myocardial infarction in April 2014, secondary to spontaneous coronary artery dissection of a small second obtuse marginal branch, which was medically managed.  She was recently admitted to [facility] and then transferred to [facility] for chest pain. She ruled out for an ACS and underwent a Lexiscan Cardiolite stress test which revealed a small area of inferolateral scar with no evidence of ischemia, EF 77%.   A relatively recent echocardiogram revealed the following:  - Left ventricle: The cavity size was at the upper limits of normal. Wall thickness was normal. Systolic function was normal. The estimated ejection fraction was in the range of 55% to 60%. Akinesis of the distal inferolateral myocardium. - Right ventricle: The cavity size was mildly dilated. Wall thickness was normal.   She continues to remain off of beta blockers due to hypotension. She says her BP normally runs 90/60 mmHg. She avoids taking SL nitro as it caused her to experience a bad headache, and she has a h/o migraines.   She also has Raynaud's phenomenon and fibromyalgia.   The Ranexa has alleviated her symptoms considerably. She also denies shortness of breath, lightheadedness, leg swelling, and palpitations.   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  Allergies  Allergen Reactions  . Dilaudid [Hydromorphone Hcl] Other (See Comments)    Resp. arrest  . Morphine And Related Nausea And Vomiting  . Codeine Nausea And Vomiting and Rash    Current Outpatient Prescriptions  Medication Sig Dispense Refill  . aspirin EC 81 MG EC tablet Take 1 tablet (81  mg total) by mouth daily.      Marland Kitchen atorvastatin (LIPITOR) 80 MG tablet TAKE 1 TABLET BY MOUTH DAILY AT 6 PM.  30 tablet  0  . beta carotene w/minerals (OCUVITE) tablet Take 1 tablet by mouth daily.      . clopidogrel (PLAVIX) 75 MG tablet Take 1 tablet (75 mg total) by mouth daily with breakfast.  30 tablet  6  . cycloSPORINE (RESTASIS) 0.05 % ophthalmic emulsion Place 1 drop into both eyes 2 (two) times daily.      Marland Kitchen ibuprofen (ADVIL,MOTRIN) 200 MG tablet Take 400 mg by mouth every 6 (six) hours as needed for pain.      . nitroGLYCERIN (NITROSTAT) 0.4 MG SL tablet Place 1 tablet (0.4 mg total) under the tongue every 5 (five) minutes as needed for chest pain.  90 tablet  0  . pantoprazole (PROTONIX) 40 MG tablet Take 40 mg by mouth daily as needed (Heartburn).      . ranolazine (RANEXA) 500 MG 12 hr tablet Take 1 tablet (500 mg total) by mouth 2 (two) times daily.  180 tablet  1   No current facility-administered medications for this visit.    Past Medical History  Diagnosis Date  . PONV (postoperative nausea and vomiting)   . Fibromyalgia   . Migraine headache     a. migraines and cluster;last migraine 3 mon ago  . Joint pain   . Joint swelling   . Scoliosis   . GERD (gastroesophageal reflux disease)  hasn't started taking any meds  . IBS (irritable bowel syndrome)     constipation  . Hx of colonic polyps   . Kidney stones 03/2010  . History of blood transfusion     41yrs ago  . Polycythemia   . CAD (coronary artery disease)     a. 12/2012 NSTEMI/Cath: LM anomalous, arising in R cusp ant to RCA, otw nl, LAD 27m with bridge, RI nl, LCX nl, OM2 small, subtl occl w/ thrombus distally - felt to be spont dissection, too small for PCI->Med Rx, RCA large, dom, nl, PDA/PD nl, EF 55% w/ focal HK in mid-dist antlat wall;  b. 05/2013 Lexi CL: EF 77%, old small inferolat scar, no ischemia.    Past Surgical History  Procedure Laterality Date  . Abdominal hysterectomy      part. hyst.  .  Tubal ligation    . Cholecystectomy  10/28/2002    lap.  . Debridement tennis elbow    . Elbow surgery      golfer's elbow-bilateral  . Knee debridement      right  . Appendectomy      with explor. lap.  . Stapedectomy      right  . Trigger finger release  08/19/2011    Procedure: RELEASE TRIGGER FINGER/A-1 PULLEY;  Surgeon: Oletta Cohn III;  [facility];  Service: Orthopedics;  Laterality: Right;  right middle finger a-1 pulley release with tenosynovectomy  . Tonsillectomy and adenoidectomy    . Diagnostic laparoscopy    . Dilation and curettage of uterus    . Tympanoplasty    . Colonoscopy    . I&d extremity  02/25/2012    Procedure: IRRIGATION AND DEBRIDEMENT EXTREMITY;  Surgeon: Dominica Severin, MD;  Location: [facility] OR;  Service: Orthopedics;  Laterality: Right;  Irrigation and Debridement and Repair Right Thumb Nerve Laceration and Assoiated Structures   . Laparotomy    . Esophagogastroduodenoscopy  09/10/2012    Procedure: ESOPHAGOGASTRODUODENOSCOPY (EGD);  Surgeon: Malissa Hippo, MD;  Location: [facility];  Service: Endoscopy;  Laterality: N/A;  730    History   Social History  . Marital Status: Married    Spouse Name: N/A    Number of Children: N/A  . Years of Education: N/A   Occupational History  . Not on file.   Social History Main Topics  . Smoking status: Never Smoker   . Smokeless tobacco: Never Used  . Alcohol Use: Yes     Comment: rarely  . Drug Use: No  . Sexual Activity: Yes    Birth Control/ Protection: Surgical   Other Topics Concern  . Not on file   Social History Narrative   Lives in [place] with husband.  Grown children.  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     Filed Vitals:   08/29/13 1520  BP: 104/57  Pulse: 82  Height: 5\' 8"  (1.727 m)  Weight: 154 lb (69.854 kg)    PHYSICAL EXAM General: NAD Neck: No JVD, no thyromegaly or thyroid nodule.  Lungs: Clear to auscultation  bilaterally with normal respiratory effort. CV: Nondisplaced PMI.  Heart regular S1/S2, no S3/S4, no murmur.  No peripheral edema.  No carotid bruit.  Normal pedal pulses.  Abdomen: Soft, nontender, no hepatosplenomegaly, no distention.  Neurologic: Alert and oriented x 3.  Psych: Normal affect. Extremities: No clubbing or cyanosis.   ECG: reviewed and available in electronic records.      ASSESSMENT AND  PLAN: 1. CAD: her chest pain has been considerably alleviated with Ranexa. She does not tolerate nitrates well, due to her h/o migraines, and has not had to use any recently. She is off of beta blockers due to hypotension. Continue ASA, Plavix, and high-dose Lipitor.  Dispo: f/u 6 months.  Prentice Docker, M.D., F.A.C.C.

## 2013-09-09 ENCOUNTER — Telehealth: Payer: Self-pay | Admitting: Cardiovascular Disease

## 2013-09-09 NOTE — Telephone Encounter (Signed)
Spoke to pt to advise results/instructions. Pt understood.  

## 2013-09-09 NOTE — Telephone Encounter (Signed)
Needs okay to stop Plavix for hand surgery scheduled Monday 09/16/13. / tgs

## 2013-09-09 NOTE — Telephone Encounter (Signed)
Please advise 

## 2013-09-09 NOTE — Telephone Encounter (Signed)
Can stop Plavix 5 days prior to surgery and restart afterwards, when deemed feasible by surgeon.

## 2013-09-09 NOTE — Telephone Encounter (Signed)
.  left message to have patient return my call with co-worker

## 2013-09-26 ENCOUNTER — Other Ambulatory Visit (INDEPENDENT_AMBULATORY_CARE_PROVIDER_SITE_OTHER): Payer: Self-pay | Admitting: Internal Medicine

## 2013-10-21 NOTE — Addendum Note (Signed)
Encounter addended by: Norlene Duel, RN on: 10/21/2013  8:03 AM<BR>     Documentation filed: Clinical Notes

## 2013-10-21 NOTE — Addendum Note (Signed)
Encounter addended by: Norlene Duel, RN on: 10/21/2013  3:40 PM<BR>     Documentation filed: Notes Section

## 2013-10-21 NOTE — Progress Notes (Signed)
Cardiac Rehabilitation Program Outcomes Report   Orientation:  01/31/2013 !st week Report: 02/08/2013 Graduate Date:  tbd Discharge Date:  tbd # of sessions completed: 3 DX: CAD/ acute NSTEMI  Cardiologist: Mare Ferrari Family MD:  Wende Neighbors Class Time:  06:45  A.  Exercise Program:  Tolerates exercise @ 2.40 METS for 15 minutes and Walk Test Results:  Pre: Pre walk Test: Rest HR 55, BP 100/62, O2 99%, RPE 6 and RPD 6,  6 minutes HR 74, BP 110/60, O2 98%, RPE 9 and RPD 9, Post HR 46, BP 90/60, O2 100, RPE 6 and RPD 6, Walked 1600 feet,at 3.0 mph, at 3.3METS.  B.  Mental Health:  Good mental attitude  C.  Education/Instruction/Skills  Accurately checks own pulse.  Rest:  63  Exercise:  86, Knows THR for exercise and Uses Perceived Exertion Scale and/or Dyspnea Scale  Uses Perceived Exertion Scale and/or Dyspnea Scale  D.  Nutrition/Weight Control/Body Composition:  Adherence to prescribed nutrition program: good    E.  Blood Lipids    Lab Results  Component Value Date   CHOL 122 06/04/2013   HDL 68 06/04/2013   LDLCALC 43 06/04/2013   TRIG 54 06/04/2013   CHOLHDL 1.8 06/04/2013    F.  Lifestyle Changes:  Making positive lifestyle changes and Not smoking:  Quit Never smoked  G.  Symptoms noted with exercise:  Asymptomatic  Report Completed By:  Oletta Lamas. Daylan Boggess RN   Comments:  This is patients 1st week report. She achieved a peak METS. Her resting HR was 63 and her resting BP was 99/60 Her peak HR was 86 and peak BP was 98/60.. A report will follow upon her 18 th visit.

## 2013-10-21 NOTE — Addendum Note (Signed)
Encounter addended by: Norlene Duel, RN on: 10/21/2013  3:42 PM<BR>     Documentation filed: Clinical Notes

## 2013-10-21 NOTE — Addendum Note (Signed)
Encounter addended by: Norlene Duel, RN on: 10/21/2013  8:02 AM<BR>     Documentation filed: Notes Section

## 2013-10-21 NOTE — Progress Notes (Signed)
Cardiac Rehabilitation Program Outcomes Report   Orientation:  01/31/2013 Halfway Report: 04/01/2013 Graduate Date:  tbd Discharge Date:  tbd # of sessions completed: 22 HX: CAD/ NSTEMI  Cardiologist: Mare Ferrari Family MD:  Wende Neighbors Class Time:  06:45  A.  Exercise Program:  Tolerates exercise @ 3.80 METS for 15 minutes  B.  Mental Health:  Good mental attitude  C.  Education/Instruction/Skills  Accurately checks own pulse.  Rest:  66  Exercise:  94, Knows THR for exercise and Uses Perceived Exertion Scale and/or Dyspnea Scale  Uses Perceived Exertion Scale and/or Dyspnea Scale  D.  Nutrition/Weight Control/Body Composition:  Adherence to prescribed nutrition program: good    E.  Blood Lipids    Lab Results  Component Value Date   CHOL 122 06/04/2013   HDL 68 06/04/2013   LDLCALC 43 06/04/2013   TRIG 54 06/04/2013   CHOLHDL 1.8 06/04/2013    F.  Lifestyle Changes:  Making positive lifestyle changes and Not smoking:  Quit Never smoked  G.  Symptoms noted with exercise:  Asymptomatic  Report Completed By:  Oletta Lamas. Dawne Casali RN   Comments:  This is patients halfway report. She achieved a peak METS of 3.80. Her resting HR was 66 and resting BP was 98/70 and peak HR was 94 and peak BP was 100/70. A report will follow upon patients 36 th visit, her graduation.

## 2013-10-22 NOTE — Progress Notes (Signed)
Cardiac Rehabilitation Program Outcomes Report   Orientation:  01/31/2013 Graduate Date:  05/29/2013 Discharge Date:  05/29/2013 # of sessions completed: 36 DX: CAD/NSTSEMI  Cardiologist: Mare Ferrari Family MD:  Hall,Zack Class Time:  06:45  A.  Exercise Program:  Tolerates exercise @ 4.16 METS for 15 minutes, Walk Test Results:  Post: Post test not performed patient had to go back to work. and Discharged to home exercise program.  Anticipated compliance:  good  B.  Mental Health:  Good mental attitude  C.  Education/Instruction/Skills  Accurately checks own pulse.  Rest:  69  Exercise: 96, Knows THR for exercise, Uses Perceived Exertion Scale and/or Dyspnea Scale and Attended 10 education classes  Uses Perceived Exertion Scale and/or Dyspnea Scale  D.  Nutrition/Weight Control/Body Composition:  Adherence to prescribed nutrition program: good    E.  Blood Lipids    Lab Results  Component Value Date   CHOL 122 06/04/2013   HDL 68 06/04/2013   LDLCALC 43 06/04/2013   TRIG 54 06/04/2013   CHOLHDL 1.8 06/04/2013    F.  Lifestyle Changes:  Making positive lifestyle changes and Not smoking:  Quit never smoked  G.  Symptoms noted with exercise:  Asymptomatic  Report Completed By:  Oletta Lamas. Deneen Slager RN   Comments:  This is patients graduation report. She achieved a peak METS of 4.16. Her resting HR was 69 and resting BP was 98/68, and her peak Hr was 96 and peak BP was 100/70. She has done well while in rehab. She was back to work when she was taking Rehab. A call will be made to patient upon her 1 month, ^ months and 1 year post graduation  To assure compliance with exercise.

## 2013-10-22 NOTE — Addendum Note (Signed)
Encounter addended by: Norlene Duel, RN on: 10/22/2013  6:52 AM<BR>     Documentation filed: Clinical Notes

## 2013-10-22 NOTE — Addendum Note (Signed)
Encounter addended by: Norlene Duel, RN on: 10/22/2013  6:50 AM<BR>     Documentation filed: Notes Section

## 2013-11-29 ENCOUNTER — Other Ambulatory Visit (INDEPENDENT_AMBULATORY_CARE_PROVIDER_SITE_OTHER): Payer: Self-pay | Admitting: Internal Medicine

## 2013-11-29 DIAGNOSIS — K219 Gastro-esophageal reflux disease without esophagitis: Secondary | ICD-10-CM

## 2013-11-29 MED ORDER — PANTOPRAZOLE SODIUM 40 MG PO TBEC: DELAYED_RELEASE_TABLET | ORAL | Status: DC

## 2013-12-24 ENCOUNTER — Other Ambulatory Visit: Payer: Self-pay | Admitting: Cardiovascular Disease

## 2014-01-03 ENCOUNTER — Encounter (HOSPITAL_COMMUNITY): Payer: Self-pay | Admitting: Emergency Medicine

## 2014-01-03 ENCOUNTER — Emergency Department (HOSPITAL_COMMUNITY): Payer: PRIVATE HEALTH INSURANCE

## 2014-01-03 ENCOUNTER — Emergency Department (HOSPITAL_COMMUNITY)
Admission: EM | Admit: 2014-01-03 | Discharge: 2014-01-03 | Disposition: A | Discharge status: Home / Self Care | Payer: PRIVATE HEALTH INSURANCE | Attending: Emergency Medicine | Admitting: Emergency Medicine

## 2014-01-03 DIAGNOSIS — S6390XA Sprain of unspecified part of unspecified wrist and hand, initial encounter: Secondary | ICD-10-CM | POA: Insufficient documentation

## 2014-01-03 DIAGNOSIS — Z79899 Other long term (current) drug therapy: Secondary | ICD-10-CM | POA: Insufficient documentation

## 2014-01-03 DIAGNOSIS — M4120 Other idiopathic scoliosis, site unspecified: Secondary | ICD-10-CM | POA: Insufficient documentation

## 2014-01-03 DIAGNOSIS — R079 Chest pain, unspecified: Secondary | ICD-10-CM | POA: Insufficient documentation

## 2014-01-03 DIAGNOSIS — I2510 Atherosclerotic heart disease of native coronary artery without angina pectoris: Secondary | ICD-10-CM | POA: Insufficient documentation

## 2014-01-03 DIAGNOSIS — Y9289 Other specified places as the place of occurrence of the external cause: Secondary | ICD-10-CM | POA: Insufficient documentation

## 2014-01-03 DIAGNOSIS — Z7982 Long term (current) use of aspirin: Secondary | ICD-10-CM | POA: Insufficient documentation

## 2014-01-03 DIAGNOSIS — Z7901 Long term (current) use of anticoagulants: Secondary | ICD-10-CM | POA: Insufficient documentation

## 2014-01-03 DIAGNOSIS — Z8739 Personal history of other diseases of the musculoskeletal system and connective tissue: Secondary | ICD-10-CM | POA: Insufficient documentation

## 2014-01-03 DIAGNOSIS — R296 Repeated falls: Secondary | ICD-10-CM | POA: Insufficient documentation

## 2014-01-03 DIAGNOSIS — K219 Gastro-esophageal reflux disease without esophagitis: Secondary | ICD-10-CM | POA: Insufficient documentation

## 2014-01-03 DIAGNOSIS — Y9389 Activity, other specified: Secondary | ICD-10-CM | POA: Insufficient documentation

## 2014-01-03 DIAGNOSIS — S63619A Unspecified sprain of unspecified finger, initial encounter: Secondary | ICD-10-CM

## 2014-01-03 DIAGNOSIS — Z87442 Personal history of urinary calculi: Secondary | ICD-10-CM | POA: Insufficient documentation

## 2014-01-03 MED ORDER — ACETAMINOPHEN 500 MG PO TABS
1000.0000 mg | ORAL_TABLET | Freq: Once | ORAL | Status: DC
  Filled 2014-01-03: qty 2

## 2014-01-03 NOTE — Discharge Instructions (Signed)
Your x-ray is negative for fracture or dislocation. Please see your primary physician, or return to the emergency department if swelling and discomfort not improving. Please apply ice. Please use Tylenol extra strength every 4 hours as needed for pain or discomfort.

## 2014-01-03 NOTE — ED Provider Notes (Signed)
CSN: [id number]     Arrival date & time 01/03/14  0806 History   First MD Initiated Contact with Patient 01/03/14 0813     Chief Complaint  Patient presents with  . Hand Pain     (Consider location/radiation/quality/duration/timing/severity/associated sxs/prior Treatment) HPI Comments: Patient sustained a mechanical fall in the parking lot at her job on last evening. She noted soreness involving the left fifth finger. She applied ice last night, but noticed even more swelling this morning. She also notices bruising of the dorsum of the fifth finger of the left hand. No other injury reported. It is of note that the patient is on Plavix. Patient presents at this time for evaluation for possible fracture or dislocation.  Patient is a 63 y.o. female presenting with hand pain. The history is provided by the patient.  Hand Pain This is a new problem. The current episode started yesterday. The problem occurs constantly. The problem has been gradually worsening. Associated symptoms include chest pain and myalgias. Pertinent negatives include no abdominal pain, arthralgias, coughing, nausea, neck pain or vomiting.    Past Medical History  Diagnosis Date  . PONV (postoperative nausea and vomiting)   . Fibromyalgia   . Migraine headache     a. migraines and cluster;last migraine 3 mon ago  . Joint pain   . Joint swelling   . Scoliosis   . GERD (gastroesophageal reflux disease)     hasn't started taking any meds  . IBS (irritable bowel syndrome)     constipation  . Hx of colonic polyps   . Kidney stones 03/2010  . History of blood transfusion     31yrs ago  . Polycythemia   . CAD (coronary artery disease)     a. 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 05/2013 Lexi CL: EF 77%, old small inferolat scar, no ischemia.   Past Surgical History  Procedure Laterality Date  . Abdominal hysterectomy      part. hyst.  . Tubal ligation    . Cholecystectomy  10/28/2002    lap.  . Debridement tennis elbow    . Elbow surgery      golfer's elbow-bilateral  . Knee debridement      right  . Appendectomy      with explor. lap.  . Stapedectomy      right  . Trigger finger release  08/19/2011    Procedure: RELEASE TRIGGER FINGER/A-1 PULLEY;  Surgeon: Willa Frater III;  Location: [place];  Service: Orthopedics;  Laterality: Right;  right middle finger a-1 pulley release with tenosynovectomy  . Tonsillectomy and adenoidectomy    . Diagnostic laparoscopy    . Dilation and curettage of uterus    . Tympanoplasty    . Colonoscopy    . I&d extremity  02/25/2012    Procedure: IRRIGATION AND DEBRIDEMENT EXTREMITY;  Surgeon: Roseanne Kaufman, MD;  Location: [place];  Service: Orthopedics;  Laterality: Right;  Irrigation and Debridement and Repair Right Thumb Nerve Laceration and Assoiated Structures   . Laparotomy    . Esophagogastroduodenoscopy  09/10/2012    Procedure: ESOPHAGOGASTRODUODENOSCOPY (EGD);  Surgeon: Rogene Houston, MD;  [facility];  Service: Endoscopy;  Laterality: N/A;  730   Family History  Problem Relation Age of Onset  . Cancer Mother     Deceased with lung CA  . Cancer Father     Deceased with lung CA  . Heart failure Mother    History  Substance Use Topics  . Smoking status: Never Smoker   . Smokeless tobacco: Never Used  . Alcohol Use: Yes     Comment: rarely   OB History   Grav Para Term Preterm Abortions TAB SAB Ect Mult Living                 Review of Systems  Constitutional: Negative for activity change.       All ROS Neg except as noted in HPI  HENT: Negative for nosebleeds.   Eyes: Negative for photophobia and discharge.  Respiratory: Negative for cough, shortness of breath and  wheezing.   Cardiovascular: Positive for chest pain. Negative for palpitations.  Gastrointestinal: Negative for nausea, vomiting, abdominal pain and blood in stool.  Genitourinary: Negative for dysuria, frequency and hematuria.  Musculoskeletal: Positive for myalgias. Negative for arthralgias, back pain and neck pain.  Skin: Negative.   Neurological: Negative for dizziness, seizures and speech difficulty.  Psychiatric/Behavioral: Negative for hallucinations and confusion.      Allergies  Dilaudid; Morphine and related; and Codeine  Home Medications   Prior to Admission medications   Medication Sig Start Date End Date Taking? Authorizing Provider  Ascorbic Acid (VITAMIN C PO) Take 2 tablets by mouth daily.   Yes Historical Provider, MD  aspirin EC 81 MG EC tablet Take 1 tablet (81 mg total) by mouth daily. 01/18/13  Yes Rogelia Mire, NP  atorvastatin (LIPITOR) 80 MG tablet Take 1 tablet (80 mg total) by mouth daily. 08/29/13  Yes Herminio Commons, MD  beta carotene w/minerals (OCUVITE) tablet Take 1 tablet by mouth daily.   Yes Historical Provider, MD  Calcium Citrate-Vitamin D (CALCITRATE/VITAMIN D PO) Take 1 tablet by mouth daily.   Yes Historical Provider, MD  clopidogrel (PLAVIX) 75 MG tablet Take 1 tablet (75 mg total) by mouth daily with breakfast. 08/29/13  Yes Herminio Commons, MD  cycloSPORINE (RESTASIS) 0.05 % ophthalmic emulsion Place 1 drop into both eyes 2 (two) times daily.   Yes Historical Provider, MD  ibuprofen (ADVIL,MOTRIN) 200 MG tablet Take 400 mg by mouth every 6 (six) hours as needed for pain.   Yes Historical Provider, MD  nitroGLYCERIN (NITROSTAT) 0.4 MG SL tablet Place 1 tablet (0.4 mg total) under the tongue every 5 (five) minutes as needed for chest pain. 07/03/13  Yes Herminio Commons, MD  pantoprazole (PROTONIX) 40 MG tablet TAKE 1 TABLET BY MOUTH ONCE DAILY 11/29/13  Yes Butch Penny, NP  RANEXA 500 MG 12 hr tablet TAKE 1 TABLET BY MOUTH TWICE  DAILY 12/24/13  Yes Herminio Commons, MD   BP 122/72  Pulse 77  Temp(Src) 98.3 F (36.8 C) (Oral)  Resp 16  Ht 5\' 8"  (1.727 m)  Wt 145 lb (65.772 kg)  BMI 22.05 kg/m2  SpO2 98% Physical Exam  Nursing note and vitals reviewed. Constitutional: She is oriented to person, place, and time. She appears well-developed and well-nourished.  Non-toxic appearance.  HENT:  Head: Normocephalic.  Right Ear: Tympanic membrane and external ear normal.  Left Ear: Tympanic membrane and external ear normal.  Eyes: EOM and lids are normal. Pupils are equal, round, and reactive to light.  Neck: Normal range of  motion. Neck supple. Carotid bruit is not present.  Cardiovascular: Normal rate, regular rhythm, normal heart sounds, intact distal pulses and normal pulses.   Pulmonary/Chest: Breath sounds normal. No respiratory distress.  Abdominal: Soft. Bowel sounds are normal. There is no tenderness. There is no guarding.  Musculoskeletal: Normal range of motion.  There is full range of motion of the left shoulder. Full range of motion of the left elbow and wrist. Is no forearm deformity appreciated. The radial pulses are 2+ bilaterally. There is swelling of the left fifth finger, there is bruising of the dorsum of the left fifth finger. There is pain with flexion and extension of the left fifth finger. Good range of motion of all of the fingers of the right and left hand. There is no bruising of the thenar eminence of the right or left hand. Capillary refill is less than 2 seconds bilaterally.  Lymphadenopathy:       Head (right side): No submandibular adenopathy present.       Head (left side): No submandibular adenopathy present.    She has no cervical adenopathy.  Neurological: She is alert and oriented to person, place, and time. She has normal strength. No cranial nerve deficit or sensory deficit.  Skin: Skin is warm and dry.  Psychiatric: She has a normal mood and affect. Her speech is normal.    ED  Course  Procedures (including critical care time) Labs Review Labs Reviewed - No data to display  Imaging Review Dg Finger Little Left  01/03/2014   CLINICAL DATA:  Left fifth finger pain and swelling after fall.  EXAM: LEFT LITTLE FINGER 2+V  COMPARISON:  None.  FINDINGS: There is no evidence of fracture or dislocation. There is no evidence of arthropathy or other focal bone abnormality. Soft tissues are unremarkable.  IMPRESSION: Normal left fifth finger.   Electronically Signed   By: Sabino Dick M.D.   On: 01/03/2014 08:32     EKG Interpretation None      MDM Patient sustained a mechanical fall in the parking lot at her job on last evening. She sustained injury to the left fifth finger. X-ray of the finger is negative for fracture or dislocation. The patient is on Plavix. She has been given instructions to observe this area carefully. She's given a finger splint. She's advised to apply ice and to elevate when possible. She is to return to the emergency department or see your primary physician for any excessive swelling, or not improving in general.    Final diagnoses:  None    *I have reviewed nursing notes, vital signs, and all appropriate lab and imaging results for this patient.Lenox Ahr, PA-C 01/03/14 [phone]

## 2014-01-03 NOTE — ED Notes (Signed)
Patient with no complaints at this time. Respirations even and unlabored. Skin warm/dry. Discharge instructions reviewed with patient at this time. Patient given opportunity to voice concerns/ask questions. Patient discharged at this time and left Emergency Department with steady gait.   

## 2014-01-03 NOTE — ED Notes (Signed)
Fell in parking lot last night, landing on left hand.  Denies hitting head/loc.  Bruising/swelling/deformity noted to left little finger.

## 2014-01-03 NOTE — ED Provider Notes (Signed)
Medical screening examination/treatment/procedure(s) were performed by non-physician practitioner and as supervising physician I was immediately available for consultation/collaboration.   EKG Interpretation None        Alfonzo Feller, DO 01/03/14 1555

## 2014-01-03 NOTE — ED Notes (Signed)
Patient refused for RN to place finger splint in ED prior to discharge because "I'll have OR staff do that because they are better at it than you are since they do them everyday". Finger splint provided to patient prior to discharge.

## 2014-01-24 ENCOUNTER — Telehealth: Payer: Self-pay | Admitting: *Deleted

## 2014-01-24 ENCOUNTER — Encounter: Payer: Self-pay | Admitting: Obstetrics and Gynecology

## 2014-01-24 NOTE — Telephone Encounter (Signed)
Pt needs to know what Rx she could be given by GYN doc to help her sleep. GYN told her to check with [place] first to make sure she would be able to take something

## 2014-01-24 NOTE — Telephone Encounter (Signed)
FYI: From a cardiac standpoint   Please advise

## 2014-01-24 NOTE — Telephone Encounter (Signed)
No further recs

## 2014-01-24 NOTE — Telephone Encounter (Signed)
Pt states she has already tried Ambien and Benadryl. This nurse asked what she has not took to see what we could offer. She states this is the only two medications she has tried. Please advise if any of mediation is recommened.

## 2014-01-24 NOTE — Telephone Encounter (Signed)
She could try taking Ambien. Sometimes simply taking Benadryl will help.

## 2014-02-05 ENCOUNTER — Ambulatory Visit (HOSPITAL_COMMUNITY)
Admission: RE | Admit: 2014-02-05 | Discharge: 2014-02-05 | Disposition: A | Discharge status: Home / Self Care | Payer: PRIVATE HEALTH INSURANCE | Source: Ambulatory Visit | Attending: Orthopedic Surgery | Admitting: Orthopedic Surgery

## 2014-02-05 DIAGNOSIS — IMO0001 Reserved for inherently not codable concepts without codable children: Secondary | ICD-10-CM | POA: Insufficient documentation

## 2014-02-05 DIAGNOSIS — M25649 Stiffness of unspecified hand, not elsewhere classified: Secondary | ICD-10-CM | POA: Diagnosis not present

## 2014-02-05 DIAGNOSIS — M6281 Muscle weakness (generalized): Secondary | ICD-10-CM

## 2014-02-05 DIAGNOSIS — M25549 Pain in joints of unspecified hand: Secondary | ICD-10-CM | POA: Insufficient documentation

## 2014-02-05 DIAGNOSIS — S63637A Sprain of interphalangeal joint of left little finger, initial encounter: Secondary | ICD-10-CM | POA: Insufficient documentation

## 2014-02-05 NOTE — Evaluation (Signed)
Occupational Therapy Evaluation  Patient Details  Name: Stacey Lawrence MRN: [id number] Date of Birth: May 15, 1951  Today's Date: 02/05/2014 Time: 1530-1605 OT Time Calculation (min): 35 min OT Evaluation 1530-1550 20' Manual therapy 1550-1605 15' Visit#: 1 of 12  Re-eval: 03/05/14  Assessment Diagnosis: S/P Left Small Finger PIPJ sprain with severe RCL sprain Next MD Visit: unknown Prior Therapy: not for this injury  Authorization: workers comp    Past Medical History:  Past Medical History  Diagnosis Date  . PONV (postoperative nausea and vomiting)   . Fibromyalgia   . Migraine headache     a. migraines and cluster;last migraine 3 mon ago  . Joint pain   . Joint swelling   . Scoliosis   . GERD (gastroesophageal reflux disease)     hasn't started taking any meds  . IBS (irritable bowel syndrome)     constipation  . Hx of colonic polyps   . Kidney stones 03/2010  . History of blood transfusion     65yrs ago  . Polycythemia   . CAD (coronary artery disease)     a. 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 05/2013 Lexi CL: EF 77%, old small inferolat scar, no ischemia.   Past Surgical History:  Past Surgical History  Procedure Laterality Date  . Abdominal hysterectomy      part. hyst.  . Tubal ligation    . Cholecystectomy  10/28/2002    lap.  . Debridement tennis elbow    . Elbow surgery      golfer's elbow-bilateral  . Knee debridement      right  . Appendectomy      with explor. lap.  . Stapedectomy      right  . Trigger finger release  08/19/2011    Procedure: RELEASE TRIGGER FINGER/A-1 PULLEY;  Surgeon: Willa Frater III;  Location: [place];  Service: Orthopedics;  Laterality: Right;  right middle  finger a-1 pulley release with tenosynovectomy  . Tonsillectomy and adenoidectomy    . Diagnostic laparoscopy    . Dilation and curettage of uterus    . Tympanoplasty    . Colonoscopy    . I&d extremity  02/25/2012    Procedure: IRRIGATION AND DEBRIDEMENT EXTREMITY;  Surgeon: Roseanne Kaufman, MD;  Location: [place];  Service: Orthopedics;  Laterality: Right;  Irrigation and Debridement and Repair Right Thumb Nerve Laceration and Assoiated Structures   . Laparotomy    . Esophagogastroduodenoscopy  09/10/2012    Procedure: ESOPHAGOGASTRODUODENOSCOPY (EGD);  Surgeon: Rogene Houston, MD;  [facility];  Service: Endoscopy;  Laterality: N/A;  730    Subjective  S:  I fell in the parking lot at work walking out at 2 am one morning while they were paving the parkinglot. 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  She reports that on 01/02/14, she was walking out after a 14 hour shift and tripped on uneven asphalt and fell on her outstretched hands, injuring her left small finger.  She consulted with Dr. Amedeo Plenty and was diagnosed with a SF PIPJ sprain and RCL sprain.  She presents today with orders for full ROM, FDS and FDP isolated exercises,  buddy tape RF to SF at all times during therapy. Limitations: buddy tape RF to SF at all times during therapy. Patient Stated Goals: I want to be able to use my pinky again. Pain Assessment Currently in Pain?: Yes Pain Score: 3  Pain Location: Finger (Comment which one) (left small) Pain Orientation: Left Pain Type: Acute pain Pain Onset: More than a month ago  Precautions/Restrictions  Precautions Precautions: None Restrictions Weight Bearing Restrictions: No  Balance Screening Balance Screen Has the patient fallen in the past 6 months: Yes How many times?: 1 Has the patient had a decrease in activity level because of a fear of falling? : No Is the patient reluctant to leave their home because of a fear of  falling? : No  Prior Functioning  Home Living Additional Comments: lives with her husband Prior Function Level of Independence: Independent with basic ADLs;Independent with homemaking with ambulation  Able to Take Stairs?: Reciprically Driving: Yes Vocation: Full time employment Vocation Requirements: endo nurse at [employer] - must use both hands at all times throughout the day Comments: enjoys crocheting and spending time with family  Assessment ADL/Vision/Perception ADL ADL Comments: unable to use small finger to grasp or manipulate any items. Dominant Hand: Right Vision - History Baseline Vision: Wears glasses all the time Perception Perception: Within Functional Limits Praxis Praxis: Intact  Cognition/Observation Cognition Orientation Level: Oriented X4 Observation/Other Assessments Observations: mild bruising and discoloration, slight misalignment ulnarly of digit  Sensation/Coordination/Edema Sensation Light Touch: Appears Intact Coordination Gross Motor Movements are Fluid and Coordinated: Yes Fine Motor Movements are Fluid and Coordinated: Yes Edema Edema: SF DIPJ right 4.2 cm left 5.9  SF PIPJ right 4.9 cm left 5.2 cm  Additional Assessments LUE AROM (degrees) LUE Overall AROM Comments: SF MCPJ 0, PIPJ 38, DIPJ 33 LUE PROM (degrees) LUE Overall PROM Comments: SF MCPJ 40 PIPJ 60 DIPJ 50 LUE Strength Grip (lbs): left 64# right 70# 3 Point Pinch:  (SF and thumb pinch left 2 # right 4 #) Palpation Palpation: moderate fasical restrictions noted in small finger and hand Left Hand Strength - Pinch (lbs) 3 Point Pinch:  (SF and thumb pinch left 2 # right 4 #)   Exercise/Treatments     Manual Therapy Manual Therapy: Myofascial release Myofascial Release: Myofascial release and manual stretching to left volar and dorsal palm, small finger to decrease pain and fascial restrictions and increase pain free mobiilty in her left hand and small finger.   Occupational  Therapy Assessment and Plan OT Assessment and Plan Clinical Impression Statement: A:  Patient presents s/p PIPJ sprain and RCL sprain of left small finger with the following defictis which are effecting her ability to complete B/IADLs, work and leisure activities.  Pt will benefit from skilled therapeutic intervention in order to improve on the following deficits: Decreased strength;Decreased range of motion;Pain;Increased edema;Increased fascial restricitons Rehab Potential: Excellent Clinical Impairments Affecting Rehab Potential: age, motiviation, severity of injury OT Frequency: Min 2X/week OT Duration: 6 weeks OT Treatment/Interventions: Self-care/ADL training;Therapeutic exercise;Modalities;Manual therapy;Therapeutic activities;Patient/family education;Splinting OT Plan: P:  Skilled OT intervention to improve upon the above listed deficits in order to return to prior level of independence with all B/IADLs, work and leisure activities. Treatment Plan:  MFR and manual stretching, PROM, AAROM, AROM with buddy tape in place.  tendon glides, joint blocking, exercises to isolate FDP and FDS with buddy tape in place.   Goals Short Term Goals Time to Complete Short Term Goals: 3 weeks Short Term Goal 1: Patient will  be educated on HEP. Short Term Goal 2: Patient will decrease edema in left small finger by .5 cm. Short Term Goal 3: Patient will improve PROM in left small finger to [facility] for increased ability to actively use small finger with functional activities.  Short Term Goal 4: Patient will use her left small digit when grasping objects 50% of the time. Short Term Goal 5: Patient will decrease pain to 2/10 in her left hand and small finger.  Long Term Goals Time to Complete Long Term Goals: 6 weeks Long Term Goal 1: Patient will return to prior level of independence with all B/IADLs, work, and leisure activities.  Long Term Goal 2: Patient will decrease edema in left small finger by .8 cm or  greater. Long Term Goal 3: Patient will have WNL AROM in her left small finger for increased independence crocheting.  Long Term Goal 4: Patient will have grip strength equal or greater than 70# for increased abilty to open containers and pinch strength of 3 pounds.  Long Term Goal 5: Patient will decrease pain in her hand to 1/10 or better.   Problem List Patient Active Problem List   Diagnosis Date Noted  . Sprain of interphalangeal joint of left little finger 02/05/2014  . Pain in joint, hand 02/05/2014  . Midsternal chest pain 06/04/2013  . CAD (coronary artery disease)   . Fibromyalgia   . GERD (gastroesophageal reflux disease)   . Migraine headache   . Acute non-STEMI < 8 weeks prior, subsequent admission after initial 01/16/2013  . Radial nerve laceration 03/27/2012  . Lack of coordination 03/27/2012  . Muscle weakness (generalized) 03/27/2012    End of Session Activity Tolerance: Patient tolerated treatment well General Behavior During Therapy: [facility] for tasks assessed/performed OT Plan of Care OT Home Exercise Plan: tendon glides, active hand exercises, [facility]  OT Patient Instructions: scanned  [place], OTR/L [phone]  02/05/2014, 5:34 PM  Physician Documentation Your signature is required to indicate approval of the treatment plan as stated above.  Please sign and either send electronically or make a copy of this report for your files and return this physician signed original.  Please mark one 1.__approve of plan  2. ___approve of plan with the following conditions.   ______________________________                                                          _____________________ Physician Signature                                                                                                             Date

## 2014-02-07 ENCOUNTER — Ambulatory Visit (HOSPITAL_COMMUNITY)
Admission: RE | Admit: 2014-02-07 | Discharge: 2014-02-07 | Disposition: A | Discharge status: Home / Self Care | Payer: PRIVATE HEALTH INSURANCE | Source: Ambulatory Visit | Attending: Internal Medicine | Admitting: Internal Medicine

## 2014-02-07 DIAGNOSIS — IMO0001 Reserved for inherently not codable concepts without codable children: Secondary | ICD-10-CM | POA: Diagnosis not present

## 2014-02-07 NOTE — Progress Notes (Signed)
Occupational Therapy Treatment Patient Details  Name: Stacey Lawrence MRN: [id number] Date of Birth: 02/26/1951  Today's Date: 02/07/2014 Time: 1512-1600 OT Time Calculation (min): 48 min Manual 1512-1532 (20') Therapeutic Exercises 1532-1600 (28')  Visit#: 2 of 12  Re-eval: 03/05/14    Authorization: workers comp  Authorization Time Period:    Authorization Visit#:   of    Subjective Symptoms/Limitations Symptoms: "its swollen more today than it was the other day. Taking glvoes onand off all day doesn't help either." Limitations: buddy tape RF to SF at all times during therapy. Pain Assessment Currently in Pain?: Yes Pain Score: 7   Precautions/Restrictions     Exercise/Treatments Hand Exercises MCPJ Flexion: PROM;10 reps MCPJ Extension: PROM;10 reps PIPJ Flexion: PROM;10 reps PIPJ Extension: PROM;10 reps DIPJ Flexion: PROM;10 reps DIPJ Extension: PROM;10 reps Joint Blocking Exercises: 5th left digit. blocked at DIP 10x, DIP 10x x2, and MCP 10x Theraputty - Grip: yellow putty - pinching in palm with 4th and 5th digits.  4th and 5th digit extension with yellow putty ring and thumb. Thumb Opposition: 5th opposition - to sponge at base of thumb 10x Sponges: picked up 1 at a time - all 33 sponges.  AROM with sponge - pressing 45h/5th digits into sponge in palm 20x Tendon Glides: 5x. pt verbalized understanding for tendon glides in HEP     Manual Therapy Manual Therapy: Myofascial release Myofascial Release: Myofascial release and manual stretching to left volar and dorsal palm, small finger to decrease pain and fascial restrictions and increase pain free mobiilty in her left hand and small finger.    Occupational Therapy Assessment and Plan OT Assessment and Plan Clinical Impression Statement: Initiated exercises this session with buddy strap on 4th and 5th digits.  Pt demonstrated good understanding of tendon glides for HEP.  pt had some tenderness with joint blocking,  but tolerated well.  Pt able to complete AROM to sponge on palm and strengthening with yellow putty for flexion and extension.  some increase inflammation this session - pt verbalized using ice early on after injury, but not recently . discussed with pt using ice on steering wheel of mower while mowing her lawn this evening. OT Plan: FDP, FDS exercises (resisted joint blocking of DIP and PIP); finding beads in putty with 4th/5th digits.  Follow up on use of ice for swelling.   Goals Short Term Goals Short Term Goal 1: Patient will be educated on HEP. Short Term Goal 1 Progress: Progressing toward goal Short Term Goal 2: Patient will decrease edema in left small finger by .5 cm. Short Term Goal 2 Progress: Progressing toward goal Short Term Goal 3: Patient will improve PROM in left small finger to [facility] for increased ability to actively use small finger with functional activities.  Short Term Goal 3 Progress: Progressing toward goal Short Term Goal 4: Patient will use her left small digit when grasping objects 50% of the time. Short Term Goal 4 Progress: Progressing toward goal Short Term Goal 5: Patient will decrease pain to 2/10 in her left hand and small finger.  Short Term Goal 5 Progress: Progressing toward goal Long Term Goals Long Term Goal 1: Patient will return to prior level of independence with all B/IADLs, work, and leisure activities.  Long Term Goal 1 Progress: Progressing toward goal Long Term Goal 2: Patient will decrease edema in left small finger by .8 cm or greater. Long Term Goal 2 Progress: Progressing toward goal Long Term Goal 3: Patient will have WNL  AROM in her left small finger for increased independence crocheting.  Long Term Goal 3 Progress: Progressing toward goal Long Term Goal 4: Patient will have grip strength equal or greater than 70# for increased abilty to open containers and pinch strength of 3 pounds.  Long Term Goal 4 Progress: Progressing toward goal Long  Term Goal 5: Patient will decrease pain in her hand to 1/10 or better.  Long Term Goal 5 Progress: Progressing toward goal  Problem List Patient Active Problem List   Diagnosis Date Noted  . Sprain of interphalangeal joint of left little finger 02/05/2014  . Pain in joint, hand 02/05/2014  . Midsternal chest pain 06/04/2013  . CAD (coronary artery disease)   . Fibromyalgia   . GERD (gastroesophageal reflux disease)   . Migraine headache   . Acute non-STEMI < 8 weeks prior, subsequent admission after initial 01/16/2013  . Radial nerve laceration 03/27/2012  . Lack of coordination 03/27/2012  . Muscle weakness (generalized) 03/27/2012    End of Session Activity Tolerance: Patient tolerated treatment well General Behavior During Therapy: [facility] for tasks assessed/performed  GO    Bea Graff, [place], OTR/L [phone]  02/07/2014, 4:20 PM

## 2014-02-11 ENCOUNTER — Ambulatory Visit (HOSPITAL_COMMUNITY): Payer: Self-pay

## 2014-02-19 ENCOUNTER — Ambulatory Visit (HOSPITAL_COMMUNITY)
Admission: RE | Admit: 2014-02-19 | Discharge: 2014-02-19 | Disposition: A | Discharge status: Home / Self Care | Payer: PRIVATE HEALTH INSURANCE | Source: Ambulatory Visit | Attending: Internal Medicine | Admitting: Internal Medicine

## 2014-02-19 DIAGNOSIS — M25549 Pain in joints of unspecified hand: Secondary | ICD-10-CM | POA: Insufficient documentation

## 2014-02-19 DIAGNOSIS — S63637A Sprain of interphalangeal joint of left little finger, initial encounter: Secondary | ICD-10-CM

## 2014-02-19 DIAGNOSIS — IMO0001 Reserved for inherently not codable concepts without codable children: Secondary | ICD-10-CM | POA: Insufficient documentation

## 2014-02-19 DIAGNOSIS — M25649 Stiffness of unspecified hand, not elsewhere classified: Secondary | ICD-10-CM | POA: Diagnosis not present

## 2014-02-19 NOTE — Progress Notes (Signed)
Occupational Therapy Treatment Patient Details  Name: Stacey Lawrence MRN: [id number] Date of Birth: 1951/07/13  Today's Date: 02/19/2014 Time: 1526-1600 OT Time Calculation (min): 34 min MFR 2992-4268 10' Therex 1536-1600 24'  Visit#: 3 of 12  Re-eval: 03/05/14    Authorization: workers comp  Authorization Time Period:    Authorization Visit#:   of    Subjective Symptoms/Limitations Symptoms: S: It was fine until I had a patient pull on it today. Pain Assessment Currently in Pain?: Yes Pain Score: 7  Pain Location: Finger (Comment which one) (5th metacarpal) Pain Orientation: Left Pain Type: Acute pain  Precautions/Restrictions  Precautions Precautions: None  Exercise/Treatments Hand Exercises MCPJ Flexion: PROM;10 reps MCPJ Extension: PROM;10 reps PIPJ Flexion: PROM;10 reps PIPJ Extension: PROM;10 reps DIPJ Flexion: PROM;10 reps DIPJ Extension: PROM;10 reps Joint Blocking Exercises: 5th left digit DIP, PIP, MCP x10 Theraputty - Grip: yellow Theraputty - Locate Pegs: 8 beads using 4th and 5th MCP Sponges: 11,12     Manual Therapy Manual Therapy: Myofascial release Myofascial Release: Myofascial release and manual stretching to left volar and dorsal palm, small finger to decrease pain and fascial restrictions and increase pain free mobiilty in her left hand and small finger.  Occupational Therapy Assessment and Plan OT Assessment and Plan Clinical Impression Statement: A: Attempted to have patient fill out FOTO. Website froze during Leisure centre manager. Patient completed all exercises without difficulty. Added bead locating with yellow putty. OT Plan: P: Have patient complete FOTO. Continue working on decreasing swelling and pain.    Goals Short Term Goals Time to Complete Short Term Goals: 3 weeks Short Term Goal 1: Patient will be educated on HEP. Short Term Goal 1 Progress: Progressing toward goal Short Term Goal 2: Patient will decrease edema in left small finger by .5  cm. Short Term Goal 2 Progress: Progressing toward goal Short Term Goal 3: Patient will improve PROM in left small finger to [facility] for increased ability to actively use small finger with functional activities.  Short Term Goal 3 Progress: Progressing toward goal Short Term Goal 4: Patient will use her left small digit when grasping objects 50% of the time. Short Term Goal 4 Progress: Progressing toward goal Short Term Goal 5: Patient will decrease pain to 2/10 in her left hand and small finger.  Short Term Goal 5 Progress: Progressing toward goal Long Term Goals Time to Complete Long Term Goals: 6 weeks Long Term Goal 1: Patient will return to prior level of independence with all B/IADLs, work, and leisure activities.  Long Term Goal 1 Progress: Progressing toward goal Long Term Goal 2: Patient will decrease edema in left small finger by .8 cm or greater. Long Term Goal 2 Progress: Progressing toward goal Long Term Goal 3: Patient will have WNL AROM in her left small finger for increased independence crocheting.  Long Term Goal 3 Progress: Progressing toward goal Long Term Goal 4: Patient will have grip strength equal or greater than 70# for increased abilty to open containers and pinch strength of 3 pounds.  Long Term Goal 4 Progress: Progressing toward goal Long Term Goal 5: Patient will decrease pain in her hand to 1/10 or better.  Long Term Goal 5 Progress: Progressing toward goal  Problem List Patient Active Problem List   Diagnosis Date Noted  . Sprain of interphalangeal joint of left little finger 02/05/2014  . Pain in joint, hand 02/05/2014  . Midsternal chest pain 06/04/2013  . CAD (coronary artery disease)   . Fibromyalgia   .  GERD (gastroesophageal reflux disease)   . Migraine headache   . Acute non-STEMI < 8 weeks prior, subsequent admission after initial 01/16/2013  . Radial nerve laceration 03/27/2012  . Lack of coordination 03/27/2012  . Muscle weakness (generalized)  03/27/2012    End of Session Activity Tolerance: Patient tolerated treatment well General Behavior During Therapy: [facility] for tasks assessed/performed   Ailene Ravel, OTR/L,CBIS   02/19/2014, 4:18 PM

## 2014-02-21 ENCOUNTER — Ambulatory Visit (HOSPITAL_COMMUNITY)
Admission: RE | Admit: 2014-02-21 | Discharge: 2014-02-21 | Disposition: A | Discharge status: Home / Self Care | Payer: PRIVATE HEALTH INSURANCE | Source: Ambulatory Visit | Attending: Orthopedic Surgery | Admitting: Orthopedic Surgery

## 2014-02-21 DIAGNOSIS — IMO0001 Reserved for inherently not codable concepts without codable children: Secondary | ICD-10-CM | POA: Diagnosis not present

## 2014-02-21 NOTE — Evaluation (Signed)
Occupational Therapy Re-Evaluation  Patient Details  Name: Stacey Lawrence MRN: [id number] Date of Birth: 1951-04-18  Today's Date: 02/21/2014 Time: 1607-3710 OT Time Calculation (min): 55 min Manual 1525-1525 (20') Therapeutic Exercises 1525-1558 (33') Self Care/HEP 1558-1605 (8') ROM Measurements 1605-1620 (15') Visit#: 4 of 12  Re-eval: 03/05/14     Authorization: workers comp  Authorization Time Period:    Authorization Visit#:   of     Past Medical History:  Past Medical History  Diagnosis Date  . PONV (postoperative nausea and vomiting)   . Fibromyalgia   . Migraine headache     a. migraines and cluster;last migraine 3 mon ago  . Joint pain   . Joint swelling   . Scoliosis   . GERD (gastroesophageal reflux disease)     hasn't started taking any meds  . IBS (irritable bowel syndrome)     constipation  . Hx of colonic polyps   . Kidney stones 03/2010  . History of blood transfusion     39yr ago  . Polycythemia   . CAD (coronary artery disease)     a. 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 05/2013 Lexi CL: EF 77%, old small inferolat scar, no ischemia.   Past Surgical History:  Past Surgical History  Procedure Laterality Date  . Abdominal hysterectomy      part. hyst.  . Tubal ligation    . Cholecystectomy  10/28/2002    lap.  . Debridement tennis elbow    . Elbow surgery      golfer's elbow-bilateral  . Knee debridement      right  . Appendectomy      with explor. lap.  . Stapedectomy      right  . Trigger finger release  08/19/2011    Procedure: RELEASE TRIGGER FINGER/A-1 PULLEY;  Surgeon: WiWilla FraterII;  [facility] Service: Orthopedics;  Laterality: Right;  right middle finger  a-1 pulley release with tenosynovectomy  . Tonsillectomy and adenoidectomy    . Diagnostic laparoscopy    . Dilation and curettage of uterus    . Tympanoplasty    . Colonoscopy    . I&d extremity  02/25/2012    Procedure: IRRIGATION AND DEBRIDEMENT EXTREMITY;  Surgeon: WiRoseanne KaufmanMD;  [place] [facility] Service: [facility];  Laterality: Right;  Irrigation and Debridement and Repair Right Thumb Nerve Laceration and Assoiated Structures   . Laparotomy    . Esophagogastroduodenoscopy  09/10/2012    Procedure: ESOPHAGOGASTRODUODENOSCOPY (EGD);  Surgeon: NaRogene HoustonMD;  Location: [facility];  Service: Endoscopy;  Laterality: N/A;  730    Subjective Symptoms/Limitations Symptoms: "I caught it in a trashbag this morning.. this mornign it was a 10" Special Tests: FOTO 59/100 Pain Assessment Currently in Pain?: Yes Pain Score: 7  Pain Location: Finger (Comment which one) (left 5th) Pain Orientation: Left Pain Type: Acute pain   Assessment Sensation/Coordination/Edema Edema Edema: Left 5th digit 4.5 cm DIP, 5.25cm PIP (Previous SF DIPJ right 4.2 cm left 5.9 SF PIPJ right 4.9 cm )  Additional Assessments LUE AROM (degrees) LUE Overall AROM Comments: left 5th digit MCP 87, PIP 71, DIP 47 (Previous SF MCPJ 0, PIPJ 38,  DIPJ 33) LUE PROM (degrees) LUE Overall PROM Comments: Left 5th digit: MCP 90, PIP 84, DIP 52 (SF MCPJ 0, PIPJ 38, DIPJ 33) LUE Strength Grip (lbs): Left 66 ( Previous left 64# right 70# ) 3 Point Pinch: 3 lbs (Previous (SF and thumb pinch left 2 # right 4 #)) Palpation Palpation: Minimal fascial restictions in left 5th digit and hand (moderate fasical restrictions noted in small finger and hand) Left Hand Strength - Pinch (lbs) 3 Point Pinch: 3 lbs (Previous (SF and thumb pinch left 2 # right 4 #))   Exercise/Treatments Hand Exercises MCPJ Flexion: PROM;10 reps MCPJ Extension: PROM;10 reps PIPJ Flexion: PROM;10 reps PIPJ Extension: PROM;10 reps DIPJ  Flexion: PROM;10 reps DIPJ Extension: PROM;10 reps Joint Blocking Exercises: 5th left digit DIP, PIP, MCP x10 with buddy strap on Theraputty - Roll: yellow Theraputty - Grip: yellow Theraputty - Pinch: yellow, with 1st, 4th, and 5th digits Theraputty - Locate Pegs: 10 beads in yellow putty, using 1st, 4th, and 5th digits     Manual Therapy Manual Therapy: Myofascial release Myofascial Release: Myofascial release and manual stretching to left volar and dorsal palm, small finger to decrease pain and fascial restrictions and increase pain free mobiilty in her left hand and small finger.   Activities of Daily Living Activities of Daily Living: Educated pt on contrast bath for decreased inflammation in left 5th digit. Pt verbalized taht she would try this weekend.  Occupational Therapy Assessment and Plan OT Assessment and Plan Clinical Impression Statement: MFR andedema managemnt completed this session due to increased swelling from pulling finger in trashbag today. pt's pain decreased from earlier this AM, but is still increased.  Educated pt on contrast bath to decrease edema for home use - pt verbalized understandinga and that she would use at home. Brief re-evaluation completed this session - pt has met 3/5 STG and is progressing towards remaining 2/5 STG and 5/5 LTG.   OT Plan: follow up on contrast bath and MD appointment.  continue manual therapy and digit strengthening.   Goals Short Term Goals Short Term Goal 1: Patient will be educated on HEP. Short Term Goal 1 Progress: Met Short Term Goal 2: Patient will decrease edema in left small finger by .5 cm. Short Term Goal 2 Progress: Progressing toward goal Short Term Goal 3: Patient will improve PROM in left small finger to [facility] for increased ability to actively use small finger with functional activities.  Short Term Goal 3 Progress: Met Short Term Goal 4: Patient will use her left small digit when grasping objects 50% of the  time. Short Term Goal 4 Progress: Met Short Term Goal 5: Patient will decrease pain to 2/10 in her left hand and small finger.  Short Term Goal 5 Progress: Progressing toward goal Long Term Goals Long Term Goal 1: Patient will return to prior level of independence with all B/IADLs, work, and leisure activities.  Long Term Goal 1 Progress: Progressing toward goal Long Term Goal 2: Patient will decrease edema in left small finger by .8 cm or greater. Long Term Goal 2 Progress: Progressing toward goal Long Term Goal 3: Patient will have WNL AROM in her left small finger for increased independence crocheting.  Long Term Goal 3 Progress: Progressing toward goal Long Term Goal 4: Patient will have grip strength equal or greater than 70# for increased abilty to open containers and pinch strength of 3 pounds.  Long Term Goal 4 Progress: Progressing toward goal Long Term Goal 5: Patient  will decrease pain in her hand to 1/10 or better.  Long Term Goal 5 Progress: Progressing toward goal  Problem List Patient Active Problem List   Diagnosis Date Noted  . Sprain of interphalangeal joint of left little finger 02/05/2014  . Pain in joint, hand 02/05/2014  . Midsternal chest pain 06/04/2013  . CAD (coronary artery disease)   . Fibromyalgia   . GERD (gastroesophageal reflux disease)   . Migraine headache   . Acute non-STEMI < 8 weeks prior, subsequent admission after initial 01/16/2013  . Radial nerve laceration 03/27/2012  . Lack of coordination 03/27/2012  . Muscle weakness (generalized) 03/27/2012    End of Session Activity Tolerance: Patient tolerated treatment well General Behavior During Therapy: WFL for tasks assessed/performed OT Plan of Care OT Home Exercise Plan: contrast bath for decreased 5th digit edema OT Patient Instructions: pt verbalized understanding. provided handout. (scanned) Consulted and Agree with Plan of Care: Patient  [place], [place], OTR/L [phone]  02/21/2014, 4:33 PM  Physician Documentation Your signature is required to indicate approval of the treatment plan as stated above.  Please sign and either send electronically or make a copy of this report for your files and return this physician signed original.  Please mark one 1.__approve of plan  2. ___approve of plan with the following conditions.   ______________________________                                                          _____________________ Physician Signature                                                                                                             Date

## 2014-02-25 ENCOUNTER — Ambulatory Visit (HOSPITAL_COMMUNITY)
Admission: RE | Admit: 2014-02-25 | Discharge: 2014-02-25 | Disposition: A | Discharge status: Home / Self Care | Payer: PRIVATE HEALTH INSURANCE | Source: Ambulatory Visit | Attending: Internal Medicine | Admitting: Internal Medicine

## 2014-02-25 DIAGNOSIS — IMO0001 Reserved for inherently not codable concepts without codable children: Secondary | ICD-10-CM | POA: Diagnosis not present

## 2014-02-25 NOTE — Progress Notes (Signed)
Occupational Therapy Treatment Patient Details  Name: FREDERICKA BOTTCHER MRN: [id number] Date of Birth: 12-23-1950  Today's Date: 02/25/2014 Time: 2992-4268 OT Time Calculation (min): 38 min Manual 1613-1640 (27') Therapeutic Exercises 3419-6222 (11')  Visit#: 5 of 12  Re-eval: 03/05/14    Authorization: workers comp  Authorization Time Period:    Authorization Visit#:   of    Subjective Symptoms/Limitations Symptoms: "he's not happy with how its looking - he says it'll be another 4 months before its right.  He's going to do an x-ray the next time." Limitations: buddy tape RF to SF at all times during therapy. Pain Assessment Currently in Pain?: Yes Pain Score: 3  Pain Location: Finger (Comment which one) (Left 5th digit) Pain Orientation: Left Pain Type: Acute pain  Precautions/Restrictions     Exercise/Treatments Hand Exercises MCPJ Flexion: PROM;10 reps MCPJ Extension: PROM;10 reps PIPJ Flexion: PROM;10 reps PIPJ Extension: PROM;10 reps DIPJ Flexion: PROM;10 reps DIPJ Extension: PROM;10 reps Joint Blocking Exercises: 5th left digit DIP, PIP, MCP x10 with buddy strap on Theraputty - Flatten: yellow putty.  in flattened putty, putting 4th, and 5th digits into putty and extending fingers outward. Theraputty - Grip: yellow Other Hand Exercises: Screw board - unscrewed 7 knuts using 1st, 4th,a nd 5th digits Other Hand Exercises: Resisted clothespins - red, green, and blue - squeexing with 5th, 4th, and 1st digits     Manual Therapy Manual Therapy: Myofascial release Myofascial Release: Myofascial release and manual stretching to left volar and dorsal palm, small finger to decrease pain and fascial restrictions and increase pain free mobiilty in her left hand and small finger.    Occupational Therapy Assessment and Plan OT Assessment and Plan Clinical Impression Statement: Pt verbalized that MD is still concered with edema in 5th digit, and will be taking x-ray at follow  up appointment in 1 month.  pt verbalizes use on contrast bath and that it helps some with swelling. Decreased pain overall, and pt is expressing attempts to be more cautions with use of finger in faily tasks. MD gave new buddy straps and requested to wear 2 at a time for increased support.  Session mocused on MFR and edema management, with introduction of resisted clothespins for strengthening. OT Plan: continue manual therapy and digit strengthening/stretches.   Goals Short Term Goals Short Term Goal 1: Patient will be educated on HEP. Short Term Goal 1 Progress: Met Short Term Goal 2: Patient will decrease edema in left small finger by .5 cm. Short Term Goal 2 Progress: Progressing toward goal Short Term Goal 3: Patient will improve PROM in left small finger to [facility] for increased ability to actively use small finger with functional activities.  Short Term Goal 3 Progress: Met Short Term Goal 4: Patient will use her left small digit when grasping objects 50% of the time. Short Term Goal 4 Progress: Met Short Term Goal 5: Patient will decrease pain to 2/10 in her left hand and small finger.  Short Term Goal 5 Progress: Progressing toward goal Long Term Goals Long Term Goal 1: Patient will return to prior level of independence with all B/IADLs, work, and leisure activities.  Long Term Goal 1 Progress: Progressing toward goal Long Term Goal 2: Patient will decrease edema in left small finger by .8 cm or greater. Long Term Goal 2 Progress: Progressing toward goal Long Term Goal 3: Patient will have WNL AROM in her left small finger for increased independence crocheting.  Long Term Goal 3 Progress: Progressing toward  goal Long Term Goal 4: Patient will have grip strength equal or greater than 70# for increased abilty to open containers and pinch strength of 3 pounds.  Long Term Goal 4 Progress: Progressing toward goal Long Term Goal 5: Patient will decrease pain in her hand to 1/10 or better.   Long Term Goal 5 Progress: Progressing toward goal  Problem List Patient Active Problem List   Diagnosis Date Noted  . Sprain of interphalangeal joint of left little finger 02/05/2014  . Pain in joint, hand 02/05/2014  . Midsternal chest pain 06/04/2013  . CAD (coronary artery disease)   . Fibromyalgia   . GERD (gastroesophageal reflux disease)   . Migraine headache   . Acute non-STEMI < 8 weeks prior, subsequent admission after initial 01/16/2013  . Radial nerve laceration 03/27/2012  . Lack of coordination 03/27/2012  . Muscle weakness (generalized) 03/27/2012    End of Session Activity Tolerance: Patient tolerated treatment well General Behavior During Therapy: [facility] for tasks assessed/performed  GO    Bea Graff, MS, OTR/L [phone]  02/25/2014, 4:57 PM

## 2014-02-26 ENCOUNTER — Inpatient Hospital Stay (HOSPITAL_COMMUNITY): Admission: RE | Admit: 2014-02-26 | Payer: Self-pay | Source: Ambulatory Visit | Admitting: Specialist

## 2014-03-31 ENCOUNTER — Ambulatory Visit (HOSPITAL_COMMUNITY)
Admission: RE | Admit: 2014-03-31 | Discharge: 2014-03-31 | Disposition: A | Discharge status: Home / Self Care | Payer: PRIVATE HEALTH INSURANCE | Source: Ambulatory Visit | Attending: Internal Medicine | Admitting: Internal Medicine

## 2014-03-31 DIAGNOSIS — M25549 Pain in joints of unspecified hand: Secondary | ICD-10-CM | POA: Insufficient documentation

## 2014-03-31 DIAGNOSIS — IMO0001 Reserved for inherently not codable concepts without codable children: Secondary | ICD-10-CM | POA: Insufficient documentation

## 2014-03-31 DIAGNOSIS — M25649 Stiffness of unspecified hand, not elsewhere classified: Secondary | ICD-10-CM | POA: Insufficient documentation

## 2014-03-31 NOTE — Evaluation (Signed)
Occupational Therapy Re-Evaluation  Patient Details  Name: Stacey Lawrence MRN: [id number] Date of Birth: 02-26-1951  Today's Date: 03/31/2014 Time: 6195-0932 OT Time Calculation (min): 37 min ROM Testing 1525-1535 (10') Manual 1535-1550 (15') Therapeutic Exercises 1550-1602 (12')  Visit#: 6 of 12  Re-eval: 03/05/14  Assessment Diagnosis: S/P Left Small Finger PIPJ sprain with severe RCL sprain Next MD Visit: 3 weeks - august 3rd  Authorization: workers comp  Authorization Time Period:    Authorization Visit#:   of     Past Medical History:  Past Medical History  Diagnosis Date  . PONV (postoperative nausea and vomiting)   . Fibromyalgia   . Migraine headache     a. migraines and cluster;last migraine 3 mon ago  . Joint pain   . Joint swelling   . Scoliosis   . GERD (gastroesophageal reflux disease)     hasn't started taking any meds  . IBS (irritable bowel syndrome)     constipation  . Hx of colonic polyps   . Kidney stones 03/2010  . History of blood transfusion     16yrs ago  . Polycythemia   . CAD (coronary artery disease)     a. 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 05/2013 Lexi CL: EF 77%, old small inferolat scar, no ischemia.   Past Surgical History:  Past Surgical History  Procedure Laterality Date  . Abdominal hysterectomy      part. hyst.  . Tubal ligation    . Cholecystectomy  10/28/2002    lap.  . Debridement tennis elbow    . Elbow surgery      golfer's elbow-bilateral  . Knee debridement      right  . Appendectomy      with explor. lap.  . Stapedectomy      right  . Trigger finger release  08/19/2011    Procedure: RELEASE TRIGGER FINGER/A-1 PULLEY;  Surgeon: Willa Frater III;  Location: [place];  Service: Orthopedics;  Laterality: Right;  right middle finger a-1 pulley release with tenosynovectomy  . Tonsillectomy and adenoidectomy    . Diagnostic laparoscopy    . Dilation and curettage of uterus    . Tympanoplasty    . Colonoscopy    . I&d extremity  02/25/2012    Procedure: IRRIGATION AND DEBRIDEMENT EXTREMITY;  Surgeon: Roseanne Kaufman, MD;  Location: [place];  Service: Orthopedics;  Laterality: Right;  Irrigation and Debridement and Repair Right Thumb Nerve Laceration and Assoiated Structures   . Laparotomy    . Esophagogastroduodenoscopy  09/10/2012    Procedure: ESOPHAGOGASTRODUODENOSCOPY (EGD);  Surgeon: Rogene Houston, MD;  [facility]: [facility];  Service: Endoscopy;  Laterality: N/A;  730    Subjective Symptoms/Limitations Symptoms: "they're not better, if not they're worse. he's going to do an MRI on it on 3 weeks." Pain Assessment Currently in Pain?: Yes Pain Score: 3  Pain Location: Finger (Comment which one) (Left 5th) Pain Orientation: Left Pain Type: Acute pain Pain Onset: More than a month ago  Assessment ADL/Vision/Perception ADL ADL Comments: Pt still reports difficulty manipulating objects with 5th digit - especially pills  Cognition/Observation Observation/Other Assessments Observations: trace bruising on medial and ulnar edge of  left 5th PIP. Slight misalignment oulnarly of digit  Sensation/Coordination/Edema Sensation Light Touch: Appears Intact Edema Edema: Left 5th digit 4.75 cm DIP, 5.5 cm PIP (02/21/14: Left 5th digit 4.5 cm DIP, 5.25cm PIP)  Additional Assessments LUE AROM (degrees) LUE Overall AROM Comments: left 5th digit MCP 98, PIP 64, DIP 52 (02/21/14: Left 5th digit: MCP 90, PIP 84, DIP 52) LUE PROM (degrees) LUE Overall PROM Comments: Left 5th digit: MCP 96, PIP 85, DIP 54 (02/21/14: Left 5th digit: MCP 90, PIP 84, DIP 52) LUE Strength Grip (lbs): Left 66 (Left 66) 3 Point Pinch: 3 lbs (02/21/14:  3#) Palpation Palpation: Moderate fascial restricions in left 5th digit. Trace restrictions in left hand. Left Hand Strength - Pinch (lbs) 3 Point Pinch: 3 lbs (02/21/14: 3#)   Exercise/Treatments Hand Exercises MCPJ Flexion: PROM;10 reps MCPJ Extension: PROM;10 reps PIPJ Flexion: PROM;10 reps PIPJ Extension: PROM;10 reps DIPJ Flexion: PROM;10 reps DIPJ Extension: PROM;10 reps Sponges: AROM to indent sponge - 15x each to firsta and second palmar creases and to thenar eminence     Manual Therapy Manual Therapy: Myofascial release Myofascial Release: Myofascial release and manual stretching to left volar and dorsal palm, small finger to decrease pain and fascial restrictions and increase pain free mobiilty in her left hand and small finger.     Occupational Therapy Assessment and Plan OT Assessment and Plan Clinical Impression Statement: Re-Evaluation completed this session.  Pt had not been seen in therapy since 6/9, due to being on vacation and schedulgin conflicts.  Pt reports no imporvement in her left 5th digit in that time, despite good use of edema management techniques and HEP.  Pt states she will be scheduled for an MRI in the next week and would like to continue therapy through when the results are in to determine next steps.  pt also has follow up appointment with MD in 3 weeks. Pt will benefit from skilled therapeutic intervention in order to improve on the following deficits: Decreased strength;Decreased range of motion;Pain;Increased edema;Increased fascial restricitons Rehab Potential: Good OT Frequency: Min 2X/week OT Duration: 4 weeks OT Treatment/Interventions: Self-care/ADL training;Therapeutic exercise;Modalities;Manual therapy;Therapeutic activities;Patient/family education;Splinting OT Plan: continue manual therapy and digit strengthening/stretches   Goals Short Term Goals Short Term Goal 1: Patient will be educated on HEP. Short Term Goal 1 Progress: Met Short  Term Goal 2: Patient will decrease edema in left small finger by .5 cm. Short Term Goal 2 Progress: Progressing toward goal Short Term Goal 3: Patient will improve PROM in left small finger to [facility] for increased ability to actively use small finger with functional activities.  Short Term Goal 3 Progress: Met Short Term Goal 4: Patient will use her left small digit when grasping objects 50% of the time. Short Term Goal 4 Progress: Progressing toward goal Short Term Goal 5: Patient will decrease pain to 2/10 in her left hand and small finger.  Short Term Goal 5 Progress: Progressing toward goal Long Term Goals Long Term Goal 1: Patient will return to prior level of independence with all B/IADLs, work, and leisure activities.  Long Term Goal 1 Progress: Progressing toward goal Long Term Goal 2: Patient will decrease edema in left small finger by .8 cm or greater. Long Term Goal 2 Progress: Progressing toward goal Long Term Goal 3: Patient will have WNL AROM in her left small finger for increased independence crocheting.  Long Term Goal 3 Progress: Progressing toward goal Long Term Goal 4: Patient will have grip strength equal or greater  than 70# for increased abilty to open containers and pinch strength of 3 pounds.  Long Term Goal 4 Progress: Progressing toward goal Long Term Goal 5: Patient will decrease pain in her hand to 1/10 or better.  Long Term Goal 5 Progress: Progressing toward goal  Problem List Patient Active Problem List   Diagnosis Date Noted  . Sprain of interphalangeal joint of left little finger 02/05/2014  . Pain in joint, hand 02/05/2014  . Midsternal chest pain 06/04/2013  . CAD (coronary artery disease)   . Fibromyalgia   . GERD (gastroesophageal reflux disease)   . Migraine headache   . Acute non-STEMI < 8 weeks prior, subsequent admission after initial 01/16/2013  . Radial nerve laceration 03/27/2012  . Lack of coordination 03/27/2012  . Muscle weakness  (generalized) 03/27/2012    End of Session Activity Tolerance: Patient tolerated treatment well  [place], [place], OTR/L [phone]  03/31/2014, 4:15 PM  Physician Documentation Your signature is required to indicate approval of the treatment plan as stated above.  Please sign and either send electronically or make a copy of this report for your files and return this physician signed original.  Please mark one 1.__approve of plan  2. ___approve of plan with the following conditions.   ______________________________                                                          _____________________ Physician Signature                                                                                                             Date

## 2014-04-03 ENCOUNTER — Ambulatory Visit (HOSPITAL_COMMUNITY): Payer: Self-pay | Admitting: Physical Therapy

## 2014-04-04 ENCOUNTER — Encounter: Payer: Self-pay | Admitting: Cardiovascular Disease

## 2014-04-04 ENCOUNTER — Ambulatory Visit (INDEPENDENT_AMBULATORY_CARE_PROVIDER_SITE_OTHER): Payer: 59 | Admitting: Cardiovascular Disease

## 2014-04-04 ENCOUNTER — Ambulatory Visit (HOSPITAL_COMMUNITY): Payer: Self-pay

## 2014-04-04 ENCOUNTER — Other Ambulatory Visit (HOSPITAL_COMMUNITY): Payer: Self-pay | Admitting: Orthopedic Surgery

## 2014-04-04 VITALS — BP 110/62 | HR 68 | Ht 68.0 in | Wt 150.0 lb

## 2014-04-04 DIAGNOSIS — I8393 Asymptomatic varicose veins of bilateral lower extremities: Secondary | ICD-10-CM

## 2014-04-04 DIAGNOSIS — I2510 Atherosclerotic heart disease of native coronary artery without angina pectoris: Secondary | ICD-10-CM

## 2014-04-04 DIAGNOSIS — G479 Sleep disorder, unspecified: Secondary | ICD-10-CM

## 2014-04-04 DIAGNOSIS — R079 Chest pain, unspecified: Secondary | ICD-10-CM

## 2014-04-04 DIAGNOSIS — M797 Fibromyalgia: Secondary | ICD-10-CM

## 2014-04-04 DIAGNOSIS — I8390 Asymptomatic varicose veins of unspecified lower extremity: Secondary | ICD-10-CM

## 2014-04-04 DIAGNOSIS — IMO0001 Reserved for inherently not codable concepts without codable children: Secondary | ICD-10-CM

## 2014-04-04 DIAGNOSIS — M79642 Pain in left hand: Secondary | ICD-10-CM

## 2014-04-04 DIAGNOSIS — Z79899 Other long term (current) drug therapy: Secondary | ICD-10-CM

## 2014-04-04 MED ORDER — ZOLPIDEM TARTRATE 5 MG PO TABS: 5.0000 mg | ORAL_TABLET | Freq: Every evening | ORAL | Status: DC | PRN

## 2014-04-04 MED ORDER — ZOLPIDEM TARTRATE 5 MG PO TABS
5.0000 mg | ORAL_TABLET | Freq: Every evening | ORAL | Status: DC | PRN
Start: 2014-04-04 — End: 2014-04-04

## 2014-04-04 NOTE — Progress Notes (Signed)
Patient ID: Stacey Lawrence, female   DOB: [birth year], 63 y.o.   MRN: [id number]      SUBJECTIVE: Stacey Lawrence is a 63 year old female with a prior history of non-ST segment elevation myocardial infarction in April 2014, secondary to subtotal occlusion of a small second obtuse marginal branch, which was medically managed as it was too small a vessel for percutaneous intervention. She also had a mid-LAD intramyocardial bridge with 30% disease. She was admitted to [facility] in 05/2013 and then transferred to [facility] for chest pain. She ruled out for an ACS and underwent a Lexiscan Cardiolite stress test which revealed a small area of inferolateral scar with no evidence of ischemia, EF 77%.   An echocardiogram in 12/2012 revealed the following:   - Left ventricle: The cavity size was at the upper limits of normal. Wall thickness was normal. Systolic function was normal. The estimated ejection fraction was in the range of 55% to 60%. Akinesis of the distal inferolateral myocardium. - Right ventricle: The cavity size was mildly dilated. Wall thickness was normal.   She continues to remain off of beta blockers due to hypotension. She avoids taking SL nitro as it caused her to experience a bad headache, and she has a h/o migraines.  She has chest pain roughly twice a week which lasts a few minutes and spontaneously resolves. It is in the right parasternal region and is associated with occasional tenderness.  She also has Raynaud's phenomenon and fibromyalgia.  The Ranexa has alleviated her symptoms considerably. She also denies shortness of breath, lightheadedness, leg swelling, and palpitations.  She does have varicose veins bilaterally but denies a history of thrombophlebitis and DVT. She has pain associated with them in bilateral popliteal fossa and lateral aspect of left thigh if she squats for too long while gardening. This may sometimes occur if she is standing in the OR too long.  She also  complains of difficulty sleeping.   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   Allergies  Allergen Reactions  . Dilaudid [Hydromorphone Hcl] Other (See Comments)    Resp. arrest  . Morphine And Related Nausea And Vomiting  . Codeine Nausea And Vomiting and Rash    Current Outpatient Prescriptions  Medication Sig Dispense Refill  . Ascorbic Acid (VITAMIN C PO) Take 2 tablets by mouth daily.      Marland Kitchen aspirin EC 81 MG EC tablet Take 1 tablet (81 mg total) by mouth daily.      Marland Kitchen atorvastatin (LIPITOR) 80 MG tablet Take 1 tablet (80 mg total) by mouth daily.  90 tablet  4  . beta carotene w/minerals (OCUVITE) tablet Take 1 tablet by mouth daily.      . Calcium Citrate-Vitamin D (CALCITRATE/VITAMIN D PO) Take 1 tablet by mouth daily.      Marland Kitchen ibuprofen (ADVIL,MOTRIN) 200 MG tablet Take 400 mg by mouth every 6 (six) hours as needed for pain.      . nitroGLYCERIN (NITROSTAT) 0.4 MG SL tablet Place 1 tablet (0.4 mg total) under the tongue every 5 (five) minutes as needed for chest pain.  90 tablet  0  . pantoprazole (PROTONIX) 40 MG tablet TAKE 1 TABLET BY MOUTH ONCE DAILY  30 tablet  6  . RANEXA 500 MG 12 hr tablet TAKE 1 TABLET BY MOUTH TWICE DAILY  180 tablet  3   No current facility-administered medications for this visit.    Past Medical History  Diagnosis Date  . PONV (postoperative nausea and vomiting)   . Fibromyalgia   . Migraine headache     a. migraines and cluster;last migraine 3 mon ago  . Joint pain   . Joint swelling   . Scoliosis   . GERD (gastroesophageal reflux disease)     hasn't started taking any meds  . IBS (irritable bowel syndrome)     constipation  . Hx of colonic polyps   . Kidney stones 03/2010  . History of blood  transfusion     59yrs ago  . Polycythemia   . CAD (coronary artery disease)     a. 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 05/2013 Lexi CL: EF 77%, old small inferolat scar, no ischemia.    Past Surgical History  Procedure Laterality Date  . Abdominal hysterectomy      part. hyst.  . Tubal ligation    . Cholecystectomy  10/28/2002    lap.  . Debridement tennis elbow    . Elbow surgery      golfer's elbow-bilateral  . Knee debridement      right  . Appendectomy      with explor. lap.  . Stapedectomy      right  . Trigger finger release  08/19/2011    Procedure: RELEASE TRIGGER FINGER/A-1 PULLEY;  Surgeon: Willa Frater III;  Location: [place];  Service: Orthopedics;  Laterality: Right;  right middle finger a-1 pulley release with tenosynovectomy  . Tonsillectomy and adenoidectomy    . Diagnostic laparoscopy    . Dilation and curettage of uterus    . Tympanoplasty    . Colonoscopy    . I&d extremity  02/25/2012    Procedure: IRRIGATION AND DEBRIDEMENT EXTREMITY;  Surgeon: Roseanne Kaufman, MD;  Location: [place];  Service: Orthopedics;  Laterality: Right;  Irrigation and Debridement and Repair Right Thumb Nerve Laceration and Assoiated Structures   . Laparotomy    . Esophagogastroduodenoscopy  09/10/2012    Procedure: ESOPHAGOGASTRODUODENOSCOPY (EGD);  Surgeon: Rogene Houston, MD;  [facility]: [facility];  Service: Endoscopy;  Laterality: N/A;  730    History   Social History  . Marital Status: Married    Spouse Name: N/A    Number of Children: N/A  . Years of Education: N/A   Occupational History  . Not on file.   Social History Main Topics  . Smoking status: Never Smoker   . Smokeless tobacco: Never Used  .  Alcohol Use: Yes     Comment: rarely  . Drug Use: No  . Sexual Activity: Yes    Birth Control/ Protection: Surgical   Other Topics Concern  . Not on file   Social History Narrative   Lives in [place] with husband.  Grown children.  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     Filed Vitals:   04/04/14 1620  BP: 110/62  Pulse: 68  Height: 5\' 8"  (1.727 m)  Weight: 150 lb (68.04 kg)    PHYSICAL EXAM General: NAD Neck: No JVD, no thyromegaly. Lungs: Clear to auscultation bilaterally with normal respiratory effort. CV: Nondisplaced PMI.  Regular rate and rhythm, normal S1/S2, no S3/S4, no murmur. No pretibial or periankle edema.  No carotid bruit.  Normal pedal pulses.  Abdomen: Soft, nontender, no hepatosplenomegaly, no distention.  Neurologic: Alert and oriented x 3.  Psych: Normal affect. Extremities: No clubbing or cyanosis. Varicosities in bilateral lower extremities. Mild tenderness to palpation in popliteal fossa b/l and along left lateral varicosities, no palpable cords, no erythema or warmth.  ECG: reviewed and available in electronic records.   Cath (01-16-2013): Left main: Anomalous. Arising in the right cusp anterior to RCA origin. Long. No stenosis. Gives off LAD, Ramus and LCX.  LAD: Gives off 2 diagonals. There is an intramyocardial segment (bridge) in the mid LAD with 30% plaque. Otherwise normal.  RAMUS: Normal  LCX: Gives off small OM-1, OM-2 and OM-3. OM-2 is a very small caliber vessel which extends out to the distal anterolateral wall and apex. In the middle to distal OM-2 the vessel is subtotally occluded with thrombus  RCA: Large dominant vessel gives off large PDA and PL.  LV-gram done in the RAO projection: Ejection fraction = 55% focal area of hypokinesis in mid to distal anterolateral wall   Assessment:  1. CAD with occlusion of a small OM-2  2. 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 EF 55% with  focal area of hypokinesis in mid to distal anterolateral wall    ASSESSMENT AND PLAN: 1. CAD: Her chest pain continues to be considerably alleviated with Ranexa. Her right parasternal pain is unlikely related to her CAD. She does not tolerate nitrates well, due to her h/o migraines, and has not had to use any recently. She is off of beta blockers due to hypotension. Continue ASA high-dose Lipitor. Will discontinue Plavix as it has been over 1 year since her NSTEMI. Will check lipids and LFT's.  2. Venous varicosities: No evidence for thrombophlebitis or DVT. Will continue to monitor.  3. Sleep disorder: I educated her on proper sleep hygiene. I will also prescribe Ambien 5 mg prn q pm for sleep.   Dispo: f/u 6 months.   Kate Sable, M.D., F.A.C.C.

## 2014-04-04 NOTE — Patient Instructions (Addendum)
Your physician wants you to follow-up in: 6 months You will receive a reminder letter in the mail two months in advance. If you don't receive a letter, please call our office to schedule the follow-up appointment.     Your physician has recommended you make the following change in your medication:    STOP Plavix   Please get fasting  blood work (Lipid,LFT'S)

## 2014-04-07 ENCOUNTER — Ambulatory Visit (HOSPITAL_COMMUNITY)
Admission: RE | Admit: 2014-04-07 | Discharge: 2014-04-07 | Disposition: A | Discharge status: Home / Self Care | Payer: PRIVATE HEALTH INSURANCE | Source: Ambulatory Visit | Attending: Internal Medicine | Admitting: Internal Medicine

## 2014-04-07 ENCOUNTER — Ambulatory Visit (HOSPITAL_COMMUNITY): Payer: Self-pay | Admitting: Physical Therapy

## 2014-04-07 NOTE — Progress Notes (Signed)
Occupational Therapy Treatment Patient Details  Name: Stacey Lawrence MRN: [id number] Date of Birth: 1951-05-03  Today's Date: 04/07/2014 Time: 4462-8638 OT Time Calculation (min): 38 min Manual 1771-1657 (24') Therapeutic Exercises [phone] (14')  Visit#: 7 of 12  Re-eval: 04/28/14    Authorization: workers comp  Authorization Time Period:    Authorization Visit#:   of    Subjective Symptoms/Limitations Symptoms: "Well, considering someone shook my handlast night at church, its not feeling too good." Limitations: buddy tape RF to SF at all times during therapy. Pain Assessment Currently in Pain?: Yes Pain Score: 7  Pain Location: Finger (Comment which one) (Left 5th) Pain Orientation: Left Pain Type: Acute pain  Precautions/Restrictions     Exercise/Treatments Hand Exercises MCPJ Flexion: PROM;10 reps MCPJ Extension: PROM;10 reps PIPJ Flexion: PROM;10 reps PIPJ Extension: PROM;10 reps DIPJ Flexion: PROM;10 reps DIPJ Extension: PROM;10 reps Joint Blocking Exercises: 5th left digit DIP, PIP, MCP x10 with buddy strap on Theraputty - Grip: yellow putty - pinching in palm with 4th and 5th digits. 4th and 5th digit extension with yellow putty ring and thumb. Theraputty - Pinch: yellow - with 1st, 4th, and 5th - flexing into putty in pal. 5 reps yellow. 10 reps red Other Hand Exercises: Resisted clothespins - green, blue, and black. pinching with 5th, 4th, and 1st digits.     Manual Therapy Manual Therapy: Myofascial release Myofascial Release: Myofascial release and manual stretching to left volar and dorsal palm, small finger to decrease pain and fascial restrictions and increase pain free mobiilty in her left hand and small finger.    Occupational Therapy Assessment and Plan OT Assessment and Plan Clinical Impression Statement: Pt is scheudled to receiv MRI on figner on July 31st (but it trying to get it scheduled sooner at [facility]).  Continues to have incresed pain and  edema in left 5th digit, exacerbated by occassional patient care or accidental bumps. MFR, manula stretching, and ther-ex compelted this session. pt able to tolerate black resisted clothespins.  Encouraged pt to share MRI results after they are in. pt verbalizes use of ice at home. OT Plan: Continue manual therapy/edema massage. Follow up on MRI appointment.   Goals Short Term Goals Short Term Goal 1: Patient will be educated on HEP. Short Term Goal 1 Progress: Met Short Term Goal 2: Patient will decrease edema in left small finger by .5 cm. Short Term Goal 2 Progress: Progressing toward goal Short Term Goal 3: Patient will improve PROM in left small finger to [facility] for increased ability to actively use small finger with functional activities.  Short Term Goal 3 Progress: Met Short Term Goal 4: Patient will use her left small digit when grasping objects 50% of the time. Short Term Goal 4 Progress: Progressing toward goal Short Term Goal 5: Patient will decrease pain to 2/10 in her left hand and small finger.  Short Term Goal 5 Progress: Progressing toward goal Long Term Goals Long Term Goal 1: Patient will return to prior level of independence with all B/IADLs, work, and leisure activities.  Long Term Goal 1 Progress: Progressing toward goal Long Term Goal 2: Patient will decrease edema in left small finger by .8 cm or greater. Long Term Goal 2 Progress: Progressing toward goal Long Term Goal 3: Patient will have WNL AROM in her left small finger for increased independence crocheting.  Long Term Goal 3 Progress: Progressing toward goal Long Term Goal 4: Patient will have grip strength equal or greater than 70# for increased  abilty to open containers and pinch strength of 3 pounds.  Long Term Goal 4 Progress: Progressing toward goal Long Term Goal 5: Patient will decrease pain in her hand to 1/10 or better.  Long Term Goal 5 Progress: Progressing toward goal  Problem List Patient Active  Problem List   Diagnosis Date Noted  . Sprain of interphalangeal joint of left little finger 02/05/2014  . Pain in joint, hand 02/05/2014  . Midsternal chest pain 06/04/2013  . CAD (coronary artery disease)   . Fibromyalgia   . GERD (gastroesophageal reflux disease)   . Migraine headache   . Acute non-STEMI < 8 weeks prior, subsequent admission after initial 01/16/2013  . Radial nerve laceration 03/27/2012  . Lack of coordination 03/27/2012  . Muscle weakness (generalized) 03/27/2012    End of Session Activity Tolerance: Patient tolerated treatment well General Behavior During Therapy: [facility] for tasks assessed/performed  GO    Bea Graff, MS, OTR/L [phone]  04/07/2014, 4:20 PM

## 2014-04-08 ENCOUNTER — Other Ambulatory Visit: Payer: Self-pay

## 2014-04-08 ENCOUNTER — Other Ambulatory Visit: Payer: 59

## 2014-04-08 DIAGNOSIS — E785 Hyperlipidemia, unspecified: Secondary | ICD-10-CM

## 2014-04-08 LAB — LIPID PANEL
Cholesterol: 123 mg/dL (ref 0–200)
HDL: 70 mg/dL (ref >39.00)
LDL Cholesterol: 47 mg/dL (ref 0–99)
NONHDL: 53
TRIGLYCERIDES: 32 mg/dL (ref 0.0–149.0)
Total CHOL/HDL Ratio: 2
VLDL: 6.4 mg/dL (ref 0.0–40.0)

## 2014-04-08 LAB — HEPATIC FUNCTION PANEL
ALK PHOS: 83 U/L (ref 39–117)
ALT: 20 U/L (ref 0–35)
AST: 27 U/L (ref 0–37)
Albumin: 3.8 g/dL (ref 3.5–5.2)
Bilirubin, Direct: 0.2 mg/dL (ref 0.0–0.3)
TOTAL PROTEIN: 6.5 g/dL (ref 6.0–8.3)
Total Bilirubin: 1 mg/dL (ref 0.2–1.2)

## 2014-04-09 ENCOUNTER — Other Ambulatory Visit (HOSPITAL_COMMUNITY): Payer: Self-pay | Admitting: Orthopedic Surgery

## 2014-04-09 DIAGNOSIS — M79642 Pain in left hand: Secondary | ICD-10-CM

## 2014-04-15 ENCOUNTER — Ambulatory Visit (HOSPITAL_COMMUNITY)
Admission: RE | Admit: 2014-04-15 | Discharge: 2014-04-15 | Disposition: A | Discharge status: Home / Self Care | Payer: PRIVATE HEALTH INSURANCE | Source: Ambulatory Visit | Attending: Orthopedic Surgery | Admitting: Orthopedic Surgery

## 2014-04-15 DIAGNOSIS — M79642 Pain in left hand: Secondary | ICD-10-CM

## 2014-04-15 DIAGNOSIS — M79609 Pain in unspecified limb: Secondary | ICD-10-CM | POA: Insufficient documentation

## 2014-04-17 ENCOUNTER — Ambulatory Visit (HOSPITAL_COMMUNITY): Payer: 59

## 2014-07-10 ENCOUNTER — Telehealth: Payer: Self-pay | Admitting: Cardiovascular Disease

## 2014-07-10 ENCOUNTER — Telehealth: Payer: Self-pay | Admitting: *Deleted

## 2014-07-10 DIAGNOSIS — M791 Myalgia, unspecified site: Secondary | ICD-10-CM

## 2014-07-10 NOTE — Telephone Encounter (Signed)
Check total CK. Reduce Lipitor to 40 mg daily. See if symptoms resolve after 7-10 days.

## 2014-07-10 NOTE — Telephone Encounter (Signed)
Pt called

## 2014-07-10 NOTE — Telephone Encounter (Signed)
Spoke with pt to ascertain more details. Asked pt to reduce Lipitor to 40 mg daily to see if this helps to resolve leg/feet cramping and pain. Pt noted to have worked a 12 hour shift yesterday which may have also led to lower extremity muscle fatigue. Informed that will be checking a serum CK level, BMET, and UA as well to see if there is any evidence of rhabdomyolysis. Informed pt I would be able to evaluate her in clinic next week in [place].

## 2014-07-10 NOTE — Telephone Encounter (Signed)
Pt thinks lipitor is causing pain in legs and feet, dark urine, confusion. Recommended pt see primary Dr to get UA. Pt wanted symptoms relayed to Dr. Bronson Ing. Will forward

## 2014-07-10 NOTE — Telephone Encounter (Signed)
Patient states that she is having the following problems and thinks that it is medication related.; Pain in legs and feet Urine is dark yellow to brown Memory loss and confusion

## 2014-07-11 MED ORDER — ATORVASTATIN CALCIUM 40 MG PO TABS: 40.0000 mg | ORAL_TABLET | Freq: Every day | ORAL | Status: DC

## 2014-07-11 NOTE — Telephone Encounter (Signed)
Lab orders placed,changed medication profile to reflect decrease in Lipitor dose,note that message was sent by Dr.koneswaran to [facility] staff to schedule apt for patient next week in [place] office, left message for pt to call back

## 2014-07-11 NOTE — Telephone Encounter (Signed)
Spoke with pt,she has company in town and will probably not get blood work done today.She also has number to [place] office to make f/u apt next week

## 2014-07-11 NOTE — Addendum Note (Signed)
Addended by: Barbarann Ehlers A on: 07/11/2014 07:53 AM   Modules accepted: Orders

## 2014-07-15 ENCOUNTER — Ambulatory Visit (INDEPENDENT_AMBULATORY_CARE_PROVIDER_SITE_OTHER): Payer: 59 | Admitting: Cardiovascular Disease

## 2014-07-15 ENCOUNTER — Encounter: Payer: Self-pay | Admitting: Cardiovascular Disease

## 2014-07-15 VITALS — BP 111/71 | HR 68 | Ht 68.0 in | Wt 148.0 lb

## 2014-07-15 DIAGNOSIS — M791 Myalgia, unspecified site: Secondary | ICD-10-CM

## 2014-07-15 DIAGNOSIS — R8299 Other abnormal findings in urine: Secondary | ICD-10-CM

## 2014-07-15 DIAGNOSIS — E785 Hyperlipidemia, unspecified: Secondary | ICD-10-CM

## 2014-07-15 DIAGNOSIS — Z79899 Other long term (current) drug therapy: Secondary | ICD-10-CM

## 2014-07-15 DIAGNOSIS — I2510 Atherosclerotic heart disease of native coronary artery without angina pectoris: Secondary | ICD-10-CM

## 2014-07-15 DIAGNOSIS — I8393 Asymptomatic varicose veins of bilateral lower extremities: Secondary | ICD-10-CM

## 2014-07-15 DIAGNOSIS — R82998 Other abnormal findings in urine: Secondary | ICD-10-CM

## 2014-07-15 NOTE — Patient Instructions (Signed)
   Continue Lipitor at 40mg  daily  Continue all other medications.   Labs for CK, UA, & BMET Office will contact with results via phone or letter.   Follow up in  January

## 2014-07-15 NOTE — Progress Notes (Signed)
Patient ID: Stacey Lawrence, female   DOB: 1951/01/07, 63 y.o.   MRN: [id number]      SUBJECTIVE: The patient recently experienced leg and feet cramping as well as pain, dark urine, and confusion. I had spoken with her over the phone and it appeared she had worked a 12 hour shift and I thought part of her symptoms may have been due to lower extremity muscle fatigue. I obtained a serum CK level, BMET, and urinalysis to evaluate for rhabdomyolysis, and had her reduce her statin to 40 mg daily. She is yet to have UA or blood tests. She has feet pain and also believes she has a left heel spur. She has not had any leg or ankle swelling. Her chest pain has been controlled with Ranexa.  ECG performed in the office today demonstrates normal sinus rhythm, with no ischemic ST segment or T-wave abnormalities noted.  Review of Systems: As per "subjective", otherwise negative.  Allergies  Allergen Reactions  . Dilaudid [Hydromorphone Hcl] Other (See Comments)    Resp. arrest  . Morphine And Related Nausea And Vomiting  . Codeine Nausea And Vomiting and Rash    Current Outpatient Prescriptions  Medication Sig Dispense Refill  . Ascorbic Acid (VITAMIN C PO) Take 2 tablets by mouth daily.      Marland Kitchen aspirin EC 81 MG EC tablet Take 1 tablet (81 mg total) by mouth daily.      Marland Kitchen atorvastatin (LIPITOR) 40 MG tablet Take 1 tablet (40 mg total) by mouth daily.  90 tablet  4  . beta carotene w/minerals (OCUVITE) tablet Take 1 tablet by mouth daily.      . Calcium Citrate-Vitamin D (CALCITRATE/VITAMIN D PO) Take 1 tablet by mouth daily.      Marland Kitchen ibuprofen (ADVIL,MOTRIN) 200 MG tablet Take 400 mg by mouth every 6 (six) hours as needed for pain.      . nitroGLYCERIN (NITROSTAT) 0.4 MG SL tablet Place 1 tablet (0.4 mg total) under the tongue every 5 (five) minutes as needed for chest pain.  90 tablet  0  . pantoprazole (PROTONIX) 40 MG tablet TAKE 1 TABLET BY MOUTH ONCE DAILY  30 tablet  6  . RANEXA 500 MG 12 hr tablet  TAKE 1 TABLET BY MOUTH TWICE DAILY  180 tablet  3  . zolpidem (AMBIEN) 5 MG tablet Take 1 tablet (5 mg total) by mouth at bedtime as needed for sleep.  30 tablet  3   No current facility-administered medications for this visit.    Past Medical History  Diagnosis Date  . PONV (postoperative nausea and vomiting)   . Fibromyalgia   . Migraine headache     a. migraines and cluster;last migraine 3 mon ago  . Joint pain   . Joint swelling   . Scoliosis   . GERD (gastroesophageal reflux disease)     hasn't started taking any meds  . IBS (irritable bowel syndrome)     constipation  . Hx of colonic polyps   . Kidney stones 03/2010  . History of blood transfusion     60yrs ago  . Polycythemia   . CAD (coronary artery disease)     a. 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 05/2013 [facility] CL: EF 77%, old small inferolat scar, no ischemia.    Past Surgical History  Procedure Laterality Date  . Abdominal hysterectomy      part. hyst.  . Tubal ligation    . Cholecystectomy  10/28/2002    lap.  . Debridement tennis elbow    . Elbow surgery      golfer's elbow-bilateral  . Knee debridement      right  . Appendectomy      with explor. lap.  . Stapedectomy      right  . Trigger finger release  08/19/2011    Procedure: RELEASE TRIGGER FINGER/A-1 PULLEY;  Surgeon: Willa Frater III;  Location: [facility];  Service: Orthopedics;  Laterality: Right;  right middle finger a-1 pulley release with tenosynovectomy  . Tonsillectomy and adenoidectomy    . Diagnostic laparoscopy    . Dilation and curettage of uterus    . Tympanoplasty    . Colonoscopy    . I&d extremity  02/25/2012    Procedure: IRRIGATION AND DEBRIDEMENT EXTREMITY;  Surgeon:  Roseanne Kaufman, MD;  Location: [facility];  Service: Orthopedics;  Laterality: Right;  Irrigation and Debridement and Repair Right Thumb Nerve Laceration and Assoiated Structures   . Laparotomy    . Esophagogastroduodenoscopy  09/10/2012    Procedure: ESOPHAGOGASTRODUODENOSCOPY (EGD);  Surgeon: Rogene Houston, MD;  [facility];  Service: Endoscopy;  Laterality: N/A;  730    History   Social History  . Marital Status: Married    Spouse Name: N/A    Number of Children: N/A  . Years of Education: N/A   Occupational History  . Not on file.   Social History Main Topics  . Smoking status: Never Smoker   . Smokeless tobacco: Never Used  . Alcohol Use: Yes     Comment: rarely  . Drug Use: No  . Sexual Activity: Yes    Birth Control/ Protection: Surgical   Other Topics Concern  . Not on file   Social History Narrative   Lives in [place] with husband.  Grown children.  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     Filed Vitals:   07/15/14 1620  BP: 111/71  Pulse: 68  Height: 5\' 8"  (1.727 m)  Weight: 148 lb (67.132 kg)    PHYSICAL EXAM General: NAD  Neck: No JVD, no thyromegaly.  Lungs: Clear to auscultation bilaterally with normal respiratory effort.  CV: Nondisplaced PMI. Regular rate and rhythm, normal S1/S2, no S3/S4, no murmur. No pretibial or periankle edema. No carotid bruit. Normal pedal pulses.  Abdomen: Soft, nontender, no hepatosplenomegaly, no distention.  Neurologic: Alert and oriented x 3.  Psych: Normal affect.  Extremities: No clubbing or cyanosis. Varicosities in bilateral lower extremities. TTP arch of feet b/l. Mild tenderness to palpation in popliteal fossa b/l and along left lateral varicosities, no palpable cords, no erythema or warmth.   ECG: Most recent ECG reviewed.      ASSESSMENT AND PLAN: 1. CAD: Her chest pain continues to be considerably alleviated with Ranexa. If it increased in frequency or severity, I would  consider increasing to 1000 mg bid. She does not tolerate nitrates well, due to her h/o migraines, and has not had to use any recently. She is off of beta blockers due to hypotension. Continue ASA and reduced dose of Lipitor 40 mg.   2. Venous varicosities: No evidence for thrombophlebitis or DVT. Will continue  to monitor.  3. Hyperlipidemia: HDL 70, LDL 47 in 03/2014. Will need to recheck at next visit given reduction of statin dose. 4. Dark urine with lower extremity muscle fatigue/cramping: This is likely secondary to fatigue from prolonged standing at work. However, given the dark urine she has been experiencing for two months, will obtain a UA, BMET, and total serum CK to evaluate for rhabdomyolysis.  Dispo: f/u January.   Kate Sable, M.D., F.A.C.C.

## 2014-07-16 ENCOUNTER — Other Ambulatory Visit: Payer: Self-pay | Admitting: Cardiovascular Disease

## 2014-07-17 LAB — BASIC METABOLIC PANEL
BUN: 13 mg/dL (ref 6–23)
CALCIUM: 9.4 mg/dL (ref 8.4–10.5)
CO2: 26 meq/L (ref 19–32)
Chloride: 103 mEq/L (ref 96–112)
Creat: 0.79 mg/dL (ref 0.50–1.10)
Glucose, Bld: 105 mg/dL — ABNORMAL HIGH (ref 70–99)
Potassium: 4.1 mEq/L (ref 3.5–5.3)
SODIUM: 139 meq/L (ref 135–145)

## 2014-07-17 LAB — URINALYSIS
Bilirubin Urine: NEGATIVE
Glucose, UA: NEGATIVE mg/dL
Hgb urine dipstick: NEGATIVE
KETONES UR: NEGATIVE mg/dL
LEUKOCYTES UA: NEGATIVE
NITRITE: NEGATIVE
PH: 5.5 (ref 5.0–8.0)
Protein, ur: NEGATIVE mg/dL
SPECIFIC GRAVITY, URINE: 1.022 (ref 1.005–1.030)
Urobilinogen, UA: 0.2 mg/dL (ref 0.0–1.0)

## 2014-07-17 LAB — CK TOTAL AND CKMB (NOT AT ARMC)
CK, MB: <0.7 ng/mL (ref 0.0–5.0)
Total CK: 69 U/L (ref 7–177)

## 2014-07-24 ENCOUNTER — Telehealth: Payer: Self-pay | Admitting: *Deleted

## 2014-07-24 NOTE — Telephone Encounter (Signed)
Notes Recorded by Laurine Blazer, LPN on 95/11/2021 at 3:43 PM Patient notified.

## 2014-07-24 NOTE — Telephone Encounter (Signed)
Notes Recorded by Laurine Blazer, LPN on 17/03/1164 at 79:03 AM Left message to return call.

## 2014-07-24 NOTE — Telephone Encounter (Signed)
-----   Message from Herminio Commons, MD sent at 07/17/2014 10:07 AM EDT ----- Please inform pt of normal results.

## 2014-08-12 ENCOUNTER — Encounter: Payer: Self-pay | Admitting: *Deleted

## 2014-08-28 ENCOUNTER — Encounter (HOSPITAL_COMMUNITY): Payer: Self-pay | Admitting: Internal Medicine

## 2014-09-30 ENCOUNTER — Telehealth: Payer: Self-pay | Admitting: Gastroenterology

## 2014-09-30 MED ORDER — AMOXICILLIN-POT CLAVULANATE 500-125 MG PO TABS: ORAL_TABLET | ORAL | Status: DC

## 2014-09-30 NOTE — Telephone Encounter (Signed)
PT UNABLE TO SIT DOWN DUE TO PAIN IN BUTTOCKS. EXAMINED AREA. FURUNCLE PRESENCE WITH MILD SURROUNDING ERYTHEMA. LESION LANCED WITH STERILE DEVICE. SML AMNT OF PURULENT DRAINAGE EXPRESSED. SEROSANGUINOUS DRAINAGE THEREAFTER.  AUGMENTIN 500 MG BID FOR 5 DAYS.

## 2014-10-13 ENCOUNTER — Other Ambulatory Visit (HOSPITAL_COMMUNITY): Payer: Self-pay | Admitting: Internal Medicine

## 2014-10-13 DIAGNOSIS — Z1231 Encounter for screening mammogram for malignant neoplasm of breast: Secondary | ICD-10-CM

## 2014-10-14 MED ORDER — DOXYCYCLINE HYCLATE 100 MG PO CAPS: 100.0000 mg | ORAL_CAPSULE | Freq: Two times a day (BID) | ORAL | Status: DC

## 2014-10-14 NOTE — Addendum Note (Signed)
Addended by: Danie Binder on: 10/14/2014 01:28 PM   Modules accepted: Orders

## 2014-10-14 NOTE — Telephone Encounter (Signed)
PARTIAL RESPONSE TO AUGMENTIN. SX PERSIST AFTER 5 DAYS ABX. CHANGE TO DOXYCYCLINE. IF SX RESOLVE BUT RETURN PT SHOULD SEE PCP TO DISCUSS SWAB FOR [facility] WITH PLAN TO ERADICATE.

## 2014-10-15 ENCOUNTER — Ambulatory Visit (INDEPENDENT_AMBULATORY_CARE_PROVIDER_SITE_OTHER): Payer: 59 | Admitting: Cardiovascular Disease

## 2014-10-15 ENCOUNTER — Encounter: Payer: Self-pay | Admitting: Cardiovascular Disease

## 2014-10-15 VITALS — BP 96/72 | HR 72 | Ht 68.0 in | Wt 148.0 lb

## 2014-10-15 DIAGNOSIS — R5383 Other fatigue: Secondary | ICD-10-CM

## 2014-10-15 DIAGNOSIS — I8393 Asymptomatic varicose veins of bilateral lower extremities: Secondary | ICD-10-CM

## 2014-10-15 DIAGNOSIS — Z79899 Other long term (current) drug therapy: Secondary | ICD-10-CM

## 2014-10-15 DIAGNOSIS — R631 Polydipsia: Secondary | ICD-10-CM

## 2014-10-15 DIAGNOSIS — E785 Hyperlipidemia, unspecified: Secondary | ICD-10-CM

## 2014-10-15 DIAGNOSIS — I2510 Atherosclerotic heart disease of native coronary artery without angina pectoris: Secondary | ICD-10-CM

## 2014-10-15 NOTE — Patient Instructions (Signed)
Your physician recommends that you schedule a follow-up appointment in: 3 months with Dr. Bronson Ing  Your physician recommends that you continue on your current medications as directed. Please refer to the Current Medication list given to you today.  Your physician recommends that you return for lab work in: Fasting  Thank you for choosing [facility]!

## 2014-10-15 NOTE — Progress Notes (Signed)
Patient ID: Stacey Lawrence, female   DOB: 1950-12-03, 64 y.o.   MRN: [id number]      SUBJECTIVE: The patient presents for follow up of nonobstructive CAD with h/o NSTEMI, venous varicosities, and hyperlipidemia. She occasionally has right-sided chest pain which is nonexertional. She denies shortness of breath and palpitations. She also denies leg swelling. She has had injections in her left heel and toes for feet pain. She has also been experiencing fatigue. She says she is constantly dehydrated in spite of drinking large amounts of water, tea, and orange juice. She said diabetes runs in her family.   Review of Systems: As per "subjective", otherwise negative.  Allergies  Allergen Reactions  . Dilaudid [Hydromorphone Hcl] Other (See Comments)    Resp. arrest  . Morphine And Related Nausea And Vomiting  . Codeine Nausea And Vomiting and Rash    Current Outpatient Prescriptions  Medication Sig Dispense Refill  . amoxicillin-clavulanate (AUGMENTIN) 500-125 MG per tablet 1 PO BID FOR 5 DAYS 10 tablet 0  . Ascorbic Acid (VITAMIN C PO) Take 2 tablets by mouth daily.    Marland Kitchen aspirin EC 81 MG EC tablet Take 1 tablet (81 mg total) by mouth daily.    Marland Kitchen atorvastatin (LIPITOR) 40 MG tablet Take 1 tablet (40 mg total) by mouth daily. 90 tablet 4  . beta carotene w/minerals (OCUVITE) tablet Take 1 tablet by mouth daily.    . Calcium Citrate-Vitamin D (CALCITRATE/VITAMIN D PO) Take 1 tablet by mouth daily.    Marland Kitchen doxycycline (VIBRAMYCIN) 100 MG capsule Take 1 capsule (100 mg total) by mouth 2 (two) times daily. 14 capsule 0  . ibuprofen (ADVIL,MOTRIN) 200 MG tablet Take 400 mg by mouth every 6 (six) hours as needed for pain.    . nitroGLYCERIN (NITROSTAT) 0.4 MG SL tablet Place 1 tablet (0.4 mg total) under the tongue every 5 (five) minutes as needed for chest pain. 90 tablet 0  . pantoprazole (PROTONIX) 40 MG tablet TAKE 1 TABLET BY MOUTH ONCE DAILY 30 tablet 6  . RANEXA 500 MG 12 hr tablet TAKE 1 TABLET  BY MOUTH TWICE DAILY 180 tablet 3  . zolpidem (AMBIEN) 5 MG tablet Take 1 tablet (5 mg total) by mouth at bedtime as needed for sleep. 30 tablet 3   No current facility-administered medications for this visit.    Past Medical History  Diagnosis Date  . PONV (postoperative nausea and vomiting)   . Fibromyalgia   . Migraine headache     a. migraines and cluster;last migraine 3 mon ago  . Joint pain   . Joint swelling   . Scoliosis   . GERD (gastroesophageal reflux disease)     hasn't started taking any meds  . IBS (irritable bowel syndrome)     constipation  . Hx of colonic polyps   . Kidney stones 03/2010  . History of blood transfusion     69yrs ago  . Polycythemia   . CAD (coronary artery disease)     a. 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 05/2013 [facility] CL: EF 77%, old small inferolat scar, no ischemia.    Past Surgical History  Procedure Laterality Date  . Abdominal hysterectomy  part. hyst.  . Tubal ligation    . Cholecystectomy  10/28/2002    lap.  . Debridement tennis elbow    . Elbow surgery      golfer's elbow-bilateral  . Knee debridement      right  . Appendectomy      with explor. lap.  . Stapedectomy      right  . Trigger finger release  08/19/2011    Procedure: RELEASE TRIGGER FINGER/A-1 PULLEY;  Surgeon: Willa Frater III;  Location: [facility];  Service: Orthopedics;  Laterality: Right;  right middle finger a-1 pulley release with tenosynovectomy  . Tonsillectomy and adenoidectomy    . Diagnostic laparoscopy    . Dilation and curettage of uterus    . Tympanoplasty    . Colonoscopy    . I&d extremity  02/25/2012    Procedure: IRRIGATION AND DEBRIDEMENT EXTREMITY;  Surgeon: Roseanne Kaufman,  MD;  Location: [facility];  Service: Orthopedics;  Laterality: Right;  Irrigation and Debridement and Repair Right Thumb Nerve Laceration and Assoiated Structures   . Laparotomy    . Esophagogastroduodenoscopy  09/10/2012    Procedure: ESOPHAGOGASTRODUODENOSCOPY (EGD);  Surgeon: Rogene Houston, MD;  [facility];  Service: Endoscopy;  Laterality: N/A;  730  . Left heart catheterization with coronary angiogram N/A 01/16/2013    Procedure: LEFT HEART CATHETERIZATION WITH CORONARY ANGIOGRAM;  Surgeon: Jolaine Artist, MD;  Location: [facility];  Service: Cardiovascular;  Laterality: N/A;    History   Social History  . Marital Status: Married    Spouse Name: N/A    Number of Children: N/A  . Years of Education: N/A   Occupational History  . Not on file.   Social History Main Topics  . Smoking status: Never Smoker   . Smokeless tobacco: Never Used  . Alcohol Use: Yes     Comment: rarely  . Drug Use: No  . Sexual Activity: Yes    Birth Control/ Protection: Surgical   Other Topics Concern  . Not on file   Social History Narrative   Lives in [place] with husband.  Grown children.  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    Weight 148 lb (67.132 kg) Height 5\' 8"  (1.727 m)  BP 96/72  Pulse 72  SpO2 97%    PHYSICAL EXAM General: NAD  Neck: No JVD, no thyromegaly.  Lungs: Clear to auscultation bilaterally with normal respiratory effort.  CV: Nondisplaced PMI. Regular rate and rhythm, normal S1/S2, no S3/S4, no murmur. No pretibial or periankle edema. No carotid bruit. Normal pedal pulses.  Abdomen: Soft, nontender, no hepatosplenomegaly, no distention.  Neurologic: Alert and oriented x 3.  Psych: Normal affect.  Extremities: No clubbing or cyanosis. Varicosities in bilateral lower extremities. Mild TTP arch of feet b/l. Mild tenderness to palpation in popliteal fossa b/l and along left lateral varicosities, no palpable cords, no erythema or  warmth.  Skin: Normal. Musculoskeletal: Normal range of motion, no gross deformities.   ECG: Most recent ECG reviewed.      ASSESSMENT AND PLAN: 1. CAD: Symptomatically stable with Ranexa. If it increased in frequency or severity, I would consider increasing to 1000 mg bid. She does not tolerate nitrates well, due to her history of migrainesmand has not had to use any recently. She is off of beta blockers due to hypotension. Continue ASA and reduced dose of Lipitor 40 mg.  2. Venous varicosities: No evidence for thrombophlebitis or DVT.  Will continue to monitor.  3. Hyperlipidemia: HDL 70, LDL 47 in 03/2014. Will need to recheck given reduction of statin dose at prior visit in 10/15. 4. Fatigue and dehydration with low BP with polydipsia: Will check HbA1C as per her request given family history of diabetes and her symptoms. Will also check TSH and free T4.  Dispo: f/u 3 months.  Kate Sable, M.D., F.A.C.C.

## 2014-10-17 ENCOUNTER — Encounter: Payer: Self-pay | Admitting: Cardiovascular Disease

## 2014-10-17 ENCOUNTER — Ambulatory Visit (INDEPENDENT_AMBULATORY_CARE_PROVIDER_SITE_OTHER): Payer: 59 | Admitting: Cardiovascular Disease

## 2014-10-17 VITALS — BP 112/76 | HR 71 | Ht 68.0 in | Wt 148.0 lb

## 2014-10-17 DIAGNOSIS — I25118 Atherosclerotic heart disease of native coronary artery with other forms of angina pectoris: Secondary | ICD-10-CM

## 2014-10-17 DIAGNOSIS — R079 Chest pain, unspecified: Secondary | ICD-10-CM

## 2014-10-17 MED ORDER — RANEXA 500 MG PO TB12: 1000.0000 mg | ORAL_TABLET | Freq: Two times a day (BID) | ORAL | Status: DC

## 2014-10-17 NOTE — Patient Instructions (Signed)
Your physician recommends that you schedule a follow-up appointment in: next week with Dr.Koneswaran    INCREASE Ranexa to 1,000 mg twice a day     Please call back later and give [place] your condition update     Thank you for choosing [facility] !

## 2014-10-17 NOTE — Progress Notes (Signed)
Patient ID: Stacey Lawrence, female   DOB: 30-Jul-1951, 64 y.o.   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 She began belching but this did not alleviate the pain. It was not made worse with deep inspiration. More recently her chest pains have been right-sided. She was afraid to take nitroglycerin because of her migraine headaches. She had a 3-lead ECG done which demonstrated normal sinus rhythm with no ischemic ST segment or T-wave abnormalities. She denies shortness of breath, palpitations, and lightheadedness. She feels some left hand numbness but no pain. She said the pain had been severe when at its onset approximately 40 minutes ago, but now rates it at 5/10.   Review of Systems: As per "subjective", otherwise negative.  Allergies  Allergen Reactions  . Dilaudid [Hydromorphone Hcl] Other (See Comments)    Resp. arrest  . Morphine And Related Nausea And Vomiting  . Codeine Nausea And Vomiting and Rash    Current Outpatient Prescriptions  Medication Sig Dispense Refill  . amoxicillin-clavulanate (AUGMENTIN) 500-125 MG per tablet 1 PO BID FOR 5 DAYS 10 tablet 0  . Ascorbic Acid (VITAMIN C PO) Take 2 tablets by mouth daily.    Marland Kitchen aspirin EC 81 MG EC tablet Take 1 tablet (81 mg total) by mouth daily.    Marland Kitchen atorvastatin (LIPITOR) 40 MG tablet Take 1 tablet (40 mg total) by mouth daily. 90 tablet 4  . beta carotene w/minerals (OCUVITE) tablet Take 1 tablet by mouth daily.    . Calcium Citrate-Vitamin D (CALCITRATE/VITAMIN D PO) Take 1 tablet by mouth daily.    Marland Kitchen doxycycline (VIBRAMYCIN) 100 MG capsule Take 1 capsule (100 mg total) by mouth 2 (two) times daily. 14 capsule 0  . ibuprofen (ADVIL,MOTRIN) 200 MG tablet Take 400 mg by mouth every 6 (six) hours as needed for pain.    . nitroGLYCERIN (NITROSTAT) 0.4 MG SL tablet Place 1  tablet (0.4 mg total) under the tongue every 5 (five) minutes as needed for chest pain. 90 tablet 0  . pantoprazole (PROTONIX) 40 MG tablet TAKE 1 TABLET BY MOUTH ONCE DAILY 30 tablet 6  . RANEXA 500 MG 12 hr tablet TAKE 1 TABLET BY MOUTH TWICE DAILY 180 tablet 3  . zolpidem (AMBIEN) 5 MG tablet Take 1 tablet (5 mg total) by mouth at bedtime as needed for sleep. 30 tablet 3   No current facility-administered medications for this visit.    Past Medical History  Diagnosis Date  . PONV (postoperative nausea and vomiting)   . Fibromyalgia   . Migraine headache     a. migraines and cluster;last migraine 3 mon ago  . Joint pain   . Joint swelling   . Scoliosis   . GERD (gastroesophageal reflux disease)     hasn't started taking any meds  . IBS (irritable bowel syndrome)     constipation  . Hx of colonic polyps   . Kidney stones 03/2010  . History of blood transfusion     34yrs ago  . Polycythemia   . CAD (coronary artery disease)     a. 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 05/2013 [facility] CL: EF 77%, old small inferolat scar, no ischemia.    Past Surgical History  Procedure Laterality Date  . Abdominal hysterectomy      part. hyst.  . Tubal ligation    . Cholecystectomy  10/28/2002    lap.  . Debridement tennis elbow    . Elbow surgery      golfer's elbow-bilateral  . Knee debridement      right  . Appendectomy      with explor. lap.  . Stapedectomy      right  . Trigger finger release  08/19/2011    Procedure: RELEASE TRIGGER FINGER/A-1 PULLEY;  Surgeon: Willa Frater III;  Location: [facility];  Service: Orthopedics;  Laterality: Right;  right middle finger a-1 pulley release with tenosynovectomy  . Tonsillectomy  and adenoidectomy    . Diagnostic laparoscopy    . Dilation and curettage of uterus    . Tympanoplasty    . Colonoscopy    . I&d extremity  02/25/2012    Procedure: IRRIGATION AND DEBRIDEMENT EXTREMITY;  Surgeon: Roseanne Kaufman, MD;  Location: [facility];  Service: Orthopedics;  Laterality: Right;  Irrigation and Debridement and Repair Right Thumb Nerve Laceration and Assoiated Structures   . Laparotomy    . Esophagogastroduodenoscopy  09/10/2012    Procedure: ESOPHAGOGASTRODUODENOSCOPY (EGD);  Surgeon: Rogene Houston, MD;  [facility];  Service: Endoscopy;  Laterality: N/A;  730  . Left heart catheterization with coronary angiogram N/A 01/16/2013    Procedure: LEFT HEART CATHETERIZATION WITH CORONARY ANGIOGRAM;  Surgeon: Jolaine Artist, MD;  Location: [facility] CATH [facility];  Service: Cardiovascular;  Laterality: N/A;    History   Social History  . Marital Status: Married    Spouse Name: N/A    Number of Children: N/A  . Years of Education: N/A   Occupational History  . Not on file.   Social History Main Topics  . Smoking status: Never Smoker   . Smokeless tobacco: Never Used  . Alcohol Use: Yes     Comment: rarely  . Drug Use: No  . Sexual Activity: Yes    Birth Control/ Protection: Surgical   Other Topics Concern  . Not on file   Social History Narrative   Lives in [place] with husband.  Grown children.  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     Filed Vitals:   10/17/14 1330  BP: 112/76  Pulse: 71  Height: 5\' 8"  (1.727 m)  Weight: 148 lb (67.132 kg)    PHYSICAL EXAM General: NAD HEENT: Normal. Neck: No JVD, no thyromegaly. Lungs: Clear to auscultation bilaterally with normal respiratory effort. CV: Nondisplaced PMI.  Regular rate and rhythm, normal S1/S2, no S3/S4, no murmur. No pretibial or periankle edema.  No carotid bruit.  Normal pedal pulses.  Abdomen: Soft, nontender, no hepatosplenomegaly, no distention.  Neurologic: Alert and  oriented x 3.  Psych: Normal affect, slightly tearful. Skin: Normal. Musculoskeletal: Normal range of motion, no gross deformities. Extremities: No clubbing or cyanosis.   ECG: Most recent ECG reviewed.      ASSESSMENT AND PLAN: 1. Chest pain in the context of CAD: I have asked her to take Tylenol and a full glass of water, and then to take SL nitroglycerin if the pain does not abate. I have asked her to lie down in the endoscopy area and  informed her should the pain not dissipate completely, to go to the ED. I will also increase Ranexa to 1000 mg bid. In the past, she has not tolerate nitrates well, due to her history of migraines. She is off of beta blockers due to hypotension. Continue ASA and Lipitor 40 mg.  2. Venous varicosities: No evidence for thrombophlebitis or DVT. Will continue to monitor.  3. Hyperlipidemia: HDL 70, LDL 47 in 03/2014. Will need to recheck given reduction of statin dose at prior visit in 10/15. 4. Fatigue and dehydration with low BP with polydipsia: Will check HbA1C as per her request given family history of diabetes and her symptoms. Will also check TSH and free T4.  Dispo: f/u next week.   Kate Sable, M.D., F.A.C.C.

## 2014-10-20 ENCOUNTER — Other Ambulatory Visit: Payer: Self-pay | Admitting: Cardiovascular Disease

## 2014-10-20 LAB — LIPID PANEL
Cholesterol: 140 mg/dL (ref 0–200)
HDL: 73 mg/dL (ref >39)
LDL CALC: 52 mg/dL (ref 0–99)
Total CHOL/HDL Ratio: 1.9 Ratio
Triglycerides: 76 mg/dL (ref <150)
VLDL: 15 mg/dL (ref 0–40)

## 2014-10-20 LAB — HEMOGLOBIN A1C
Hgb A1c MFr Bld: 5.7 % — ABNORMAL HIGH (ref <5.7)
Mean Plasma Glucose: 117 mg/dL — ABNORMAL HIGH (ref <117)

## 2014-10-20 LAB — HEPATIC FUNCTION PANEL
ALK PHOS: 101 U/L (ref 39–117)
ALT: 16 U/L (ref 0–35)
AST: 17 U/L (ref 0–37)
Albumin: 4 g/dL (ref 3.5–5.2)
Bilirubin, Direct: 0.1 mg/dL (ref 0.0–0.3)
Indirect Bilirubin: 0.6 mg/dL (ref 0.2–1.2)
TOTAL PROTEIN: 6.2 g/dL (ref 6.0–8.3)
Total Bilirubin: 0.7 mg/dL (ref 0.2–1.2)

## 2014-10-21 LAB — TSH: TSH: 3.061 u[IU]/mL (ref 0.350–4.500)

## 2014-10-21 LAB — T4, FREE: FREE T4: 1.06 ng/dL (ref 0.80–1.80)

## 2014-10-24 ENCOUNTER — Telehealth: Payer: Self-pay | Admitting: Cardiovascular Disease

## 2014-10-24 MED ORDER — RANEXA 500 MG PO TB12: 1000.0000 mg | ORAL_TABLET | Freq: Two times a day (BID) | ORAL | Status: DC

## 2014-10-24 NOTE — Telephone Encounter (Signed)
rx to [facility]

## 2014-10-27 ENCOUNTER — Ambulatory Visit (HOSPITAL_COMMUNITY)
Admission: RE | Admit: 2014-10-27 | Discharge: 2014-10-27 | Disposition: A | Discharge status: Home / Self Care | Payer: 59 | Source: Ambulatory Visit | Attending: Internal Medicine | Admitting: Internal Medicine

## 2014-10-27 DIAGNOSIS — Z1231 Encounter for screening mammogram for malignant neoplasm of breast: Secondary | ICD-10-CM | POA: Insufficient documentation

## 2014-11-10 ENCOUNTER — Telehealth: Payer: Self-pay | Admitting: *Deleted

## 2014-11-10 NOTE — Telephone Encounter (Signed)
Pt was told to increase renexa and she is having low BP every time she bends over and she has been feeling dizzy

## 2014-11-10 NOTE — Telephone Encounter (Signed)
Patient called and given Dr.Koneswaran notes. All questions answered. Patient voiced understanding.

## 2014-11-10 NOTE — Telephone Encounter (Signed)
Can reduce back to 500 mg bid and see how she does clinically with respect to chest pain.

## 2014-11-10 NOTE — Telephone Encounter (Signed)
Forward to Dr Koneswaran 

## 2014-11-11 ENCOUNTER — Telehealth (INDEPENDENT_AMBULATORY_CARE_PROVIDER_SITE_OTHER): Payer: Self-pay | Admitting: Internal Medicine

## 2014-11-11 DIAGNOSIS — K648 Other hemorrhoids: Secondary | ICD-10-CM

## 2014-11-11 MED ORDER — HYDROCORTISONE ACE-PRAMOXINE 1-1 % RE FOAM: 1.0000 | Freq: Two times a day (BID) | RECTAL | Status: DC

## 2014-11-11 NOTE — Telephone Encounter (Signed)
Called with c/o hemorrhoids

## 2014-11-17 ENCOUNTER — Other Ambulatory Visit (INDEPENDENT_AMBULATORY_CARE_PROVIDER_SITE_OTHER): Payer: Self-pay | Admitting: *Deleted

## 2014-11-17 DIAGNOSIS — Z1211 Encounter for screening for malignant neoplasm of colon: Secondary | ICD-10-CM

## 2014-11-19 ENCOUNTER — Ambulatory Visit: Payer: Self-pay | Admitting: Cardiovascular Disease

## 2014-11-21 ENCOUNTER — Encounter (INDEPENDENT_AMBULATORY_CARE_PROVIDER_SITE_OTHER): Payer: Self-pay

## 2014-12-05 ENCOUNTER — Telehealth (INDEPENDENT_AMBULATORY_CARE_PROVIDER_SITE_OTHER): Payer: Self-pay | Admitting: *Deleted

## 2014-12-05 NOTE — Telephone Encounter (Signed)
agree

## 2014-12-05 NOTE — Telephone Encounter (Signed)
Referring MD/PCP: hall   Procedure: tcs  Reason/Indication:  screening  Has patient had this procedure before?  Yes, 2010 -- scanned  If so, when, by whom and where?    Is there a family history of colon cancer?  no  Who?  What age when diagnosed?    Is patient diabetic?   no      Does patient have prosthetic heart valve?  no  Do you have a pacemaker?  no  Has patient ever had endocarditis? no  Has patient had joint replacement within last 12 months?  no  Does patient tend to be constipated or take laxatives? ocassionally  Is patient on Coumadin, Plavix and/or Aspirin? yes  Medications: see epic  Allergies: see epic  Medication Adjustment: asa 2 days  Procedure date & time: 12/29/14 at 730

## 2014-12-29 ENCOUNTER — Encounter (HOSPITAL_COMMUNITY): Admission: RE | Payer: Self-pay | Source: Ambulatory Visit

## 2014-12-29 ENCOUNTER — Ambulatory Visit (HOSPITAL_COMMUNITY): Admission: RE | Admit: 2014-12-29 | Payer: 59 | Source: Ambulatory Visit | Admitting: Internal Medicine

## 2014-12-29 SURGERY — COLONOSCOPY
Anesthesia: Moderate Sedation

## 2015-01-01 ENCOUNTER — Ambulatory Visit: Payer: Self-pay | Admitting: Cardiovascular Disease

## 2015-02-06 ENCOUNTER — Telehealth: Payer: Self-pay | Admitting: Cardiovascular Disease

## 2015-02-06 NOTE — Telephone Encounter (Signed)
[facility] patient - forwarding to MD for advice.  Patient was last seen 10/17/14 and was supposed to follow up in 1 week.  Patient cancelled on 3/2 (had to work) & no showed on 01/01/2015.  Also, looks like she has visit scheduled with Dr. Sherren Mocha Early for 03/24/15 for new varicose vein.

## 2015-02-06 NOTE — Telephone Encounter (Signed)
Will call her today

## 2015-02-06 NOTE — Telephone Encounter (Signed)
Still having leg pain  Wanted to know if she needs to start back on plavix. Call cell first then home number if you dont reach her on cell Cell # [phone] Home [phone]

## 2015-02-10 ENCOUNTER — Other Ambulatory Visit: Payer: Self-pay | Admitting: *Deleted

## 2015-02-10 DIAGNOSIS — I83893 Varicose veins of bilateral lower extremities with other complications: Secondary | ICD-10-CM

## 2015-02-11 ENCOUNTER — Other Ambulatory Visit: Payer: Self-pay | Admitting: *Deleted

## 2015-02-11 DIAGNOSIS — I83893 Varicose veins of bilateral lower extremities with other complications: Secondary | ICD-10-CM

## 2015-02-12 ENCOUNTER — Encounter: Payer: Self-pay | Admitting: Cardiovascular Disease

## 2015-02-12 ENCOUNTER — Ambulatory Visit (INDEPENDENT_AMBULATORY_CARE_PROVIDER_SITE_OTHER): Payer: 59 | Admitting: Cardiovascular Disease

## 2015-02-12 VITALS — BP 124/70 | HR 81 | Ht 68.0 in | Wt 150.0 lb

## 2015-02-12 DIAGNOSIS — R252 Cramp and spasm: Secondary | ICD-10-CM | POA: Diagnosis not present

## 2015-02-12 DIAGNOSIS — R079 Chest pain, unspecified: Secondary | ICD-10-CM

## 2015-02-12 DIAGNOSIS — I8393 Asymptomatic varicose veins of bilateral lower extremities: Secondary | ICD-10-CM | POA: Diagnosis not present

## 2015-02-12 DIAGNOSIS — I25118 Atherosclerotic heart disease of native coronary artery with other forms of angina pectoris: Secondary | ICD-10-CM

## 2015-02-12 NOTE — Progress Notes (Signed)
Patient ID: Stacey Lawrence, female   DOB: 1951/06/30, 64 y.o.   MRN: [id number]      SUBJECTIVE: Stacey Lawrence presents for follow-up for varicose veins, chest pain, and coronary disease. She also has a history of migraine headaches. She has been having pain and swelling with regards to venous varicosities, and is scheduled to see vascular surgery in July. The pain has worsened bilaterally behind her knees, calves, and up to the lateral aspect of the mid thigh. She has also had bilateral feet cramping and has been rolling them over Coke cans after work and occasionally a tennis ball for relief. She had a venous Doppler performed by a colleague which confirmed superficial thrombus. She has been applying ice packs.  Her chest pain has been stable. She has been having intermittent shortness of breath and thinks some of it may be due to allergies.   Review of Systems: As per "subjective", otherwise negative.  Allergies  Allergen Reactions  . Dilaudid [Hydromorphone Hcl] Other (See Comments)    Resp. arrest  . Morphine And Related Nausea And Vomiting  . Codeine Nausea And Vomiting and Rash    Current Outpatient Prescriptions  Medication Sig Dispense Refill  . amoxicillin-clavulanate (AUGMENTIN) 500-125 MG per tablet 1 PO BID FOR 5 DAYS 10 tablet 0  . Ascorbic Acid (VITAMIN C PO) Take 2 tablets by mouth daily.    Marland Kitchen aspirin EC 81 MG EC tablet Take 1 tablet (81 mg total) by mouth daily.    Marland Kitchen atorvastatin (LIPITOR) 40 MG tablet Take 1 tablet (40 mg total) by mouth daily. 90 tablet 4  . beta carotene w/minerals (OCUVITE) tablet Take 1 tablet by mouth daily.    . Calcium Citrate-Vitamin D (CALCITRATE/VITAMIN D PO) Take 1 tablet by mouth daily.    Marland Kitchen doxycycline (VIBRAMYCIN) 100 MG capsule Take 1 capsule (100 mg total) by mouth 2 (two) times daily. 14 capsule 0  . hydrocortisone-pramoxine (PROCTOFOAM HC) rectal foam Place 1 applicator rectally 2 (two) times daily. 10 g 0  . ibuprofen  (ADVIL,MOTRIN) 200 MG tablet Take 400 mg by mouth every 6 (six) hours as needed for pain.    . nitroGLYCERIN (NITROSTAT) 0.4 MG SL tablet Place 1 tablet (0.4 mg total) under the tongue every 5 (five) minutes as needed for chest pain. 90 tablet 0  . pantoprazole (PROTONIX) 40 MG tablet TAKE 1 TABLET BY MOUTH ONCE DAILY 30 tablet 6  . RANEXA 500 MG 12 hr tablet Take 2 tablets (1,000 mg total) by mouth 2 (two) times daily. 360 tablet 3  . zolpidem (AMBIEN) 5 MG tablet Take 1 tablet (5 mg total) by mouth at bedtime as needed for sleep. 30 tablet 3   No current facility-administered medications for this visit.    Past Medical History  Diagnosis Date  . PONV (postoperative nausea and vomiting)   . Fibromyalgia   . Migraine headache     a. migraines and cluster;last migraine 3 mon ago  . Joint pain   . Joint swelling   . Scoliosis   . GERD (gastroesophageal reflux disease)     hasn't started taking any meds  . IBS (irritable bowel syndrome)     constipation  . Hx of colonic polyps   . Kidney stones 03/2010  . History of blood transfusion     82yrs ago  . Polycythemia   . CAD (coronary artery disease)     a. 12/2012 NSTEMI/Cath: LM anomalous, arising in R cusp ant to RCA, otw  nl, LAD 43m with bridge, RI nl, LCX nl, OM2 small, subtl occl w/ thrombus distally - felt to be spont dissection, too small for PCI->Med Rx, RCA large, dom, nl, PDA/PD nl, EF 55% w/ focal HK in mid-dist antlat wall;  b. 05/2013 Lexi CL: EF 77%, old small inferolat scar, no ischemia.    Past Surgical History  Procedure Laterality Date  . Abdominal hysterectomy      part. hyst.  . Tubal ligation    . Cholecystectomy  10/28/2002    lap.  . Debridement tennis elbow    . Elbow surgery      golfer's elbow-bilateral  . Knee debridement      right  . Appendectomy      with explor. lap.  . Stapedectomy      right  . Trigger finger release  08/19/2011    Procedure: RELEASE TRIGGER FINGER/A-1 PULLEY;  Surgeon: Willa Frater III;  Location: [place];  Service: Orthopedics;  Laterality: Right;  right middle finger a-1 pulley release with tenosynovectomy  . Tonsillectomy and adenoidectomy    . Diagnostic laparoscopy    . Dilation and curettage of uterus    . Tympanoplasty    . Colonoscopy    . I&d extremity  02/25/2012    Procedure: IRRIGATION AND DEBRIDEMENT EXTREMITY;  Surgeon: Roseanne Kaufman, MD;  Location: [place];  Service: Orthopedics;  Laterality: Right;  Irrigation and Debridement and Repair Right Thumb Nerve Laceration and Assoiated Structures   . Laparotomy    . Esophagogastroduodenoscopy  09/10/2012    Procedure: ESOPHAGOGASTRODUODENOSCOPY (EGD);  Surgeon: Rogene Houston, MD;  [facility]: [facility];  Service: Endoscopy;  Laterality: N/A;  730  . Left heart catheterization with coronary angiogram N/A 01/16/2013    Procedure: LEFT HEART CATHETERIZATION WITH CORONARY ANGIOGRAM;  Surgeon: Jolaine Artist, MD;  Location: [facility] C[facility] LAB;  Service: Cardiovascular;  Laterality: N/A;    History   Social History  . Marital Status: Married    Spouse Name: N/A  . Number of Children: N/A  . Years of Education: N/A   Occupational History  . Not on file.   Social History Main Topics  . Smoking status: Never Smoker   . Smokeless tobacco: Never Used  . Alcohol Use: Yes     Comment: rarely  . Drug Use: No  . Sexual Activity: Yes    Birth Control/ Protection: Surgical   Other Topics Concern  . Not on file   Social History Narrative   Lives in [place] with husband.  Grown children.  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     Filed Vitals:   02/12/15 1123  BP: 124/70  Pulse: 81  Height: 5\' 8"  (1.727 m)  Weight: 150 lb (68.04 kg)  SpO2: 95%    PHYSICAL EXAM General: NAD HEENT: Normal. Neck: No JVD, no thyromegaly. Lungs: Clear to auscultation bilaterally with normal respiratory effort. CV: Nondisplaced PMI.  Regular rate and rhythm, normal S1/S2, no  S3/S4, no murmur. No pretibial or periankle edema.  No carotid bruit.  Normal pedal pulses.  Abdomen: Soft, nontender, no hepatosplenomegaly, no distention.  Neurologic: Alert and oriented x 3.  Psych: Normal affect. Skin: Multiple superficial regions of venous thrombophlebitis on both legs, popliteal fossa, and lateral aspect of mid-thigh bilaterally. Musculoskeletal: Normal range of motion, no gross deformities. Extremities: No clubbing or cyanosis.   ECG: Most recent ECG reviewed.      ASSESSMENT AND PLAN: 1.  Chest pain in the context of CAD: Symptomatically stable. Continue Ranexa 1000 mg bid. In the past, she has not tolerated nitrates well, due to her history of migraines. She is off of beta blockers due to hypotension. Continue ASA and Lipitor 40 mg.   2. Venous varicosities: Previously no evidence for DVT. Having significant pain and swelling, worse by the end of the day. Scheduled to see vascular surgery in July. Will see if she can be seen sooner. She may need vein sclerotherapy.  3. Hyperlipidemia: HDL 73, LDL 52 on 10/20/2014. Continue Lipitor 40 mg.  Dispo: f/u July.   Kate Sable, M.D., F.A.C.C.

## 2015-02-12 NOTE — Patient Instructions (Addendum)
Your physician recommends that you schedule a follow-up appointment in: July with Dr Joline Salt moved up to see Dr Ruta Hinds on June 8th at 10:15 am  Your physician recommends that you continue on your current medications as directed. Please refer to the Current Medication list given to you today.   Thank you for choosing [facility] !

## 2015-02-20 ENCOUNTER — Other Ambulatory Visit: Payer: Self-pay

## 2015-02-20 MED ORDER — ATORVASTATIN CALCIUM 40 MG PO TABS: 40.0000 mg | ORAL_TABLET | Freq: Every day | ORAL | Status: DC

## 2015-02-23 ENCOUNTER — Encounter: Payer: Self-pay | Admitting: Vascular Surgery

## 2015-02-24 ENCOUNTER — Encounter: Payer: Self-pay | Admitting: Vascular Surgery

## 2015-02-25 ENCOUNTER — Ambulatory Visit (INDEPENDENT_AMBULATORY_CARE_PROVIDER_SITE_OTHER): Payer: 59 | Admitting: Vascular Surgery

## 2015-02-25 ENCOUNTER — Encounter: Payer: Self-pay | Admitting: Vascular Surgery

## 2015-02-25 ENCOUNTER — Ambulatory Visit (HOSPITAL_COMMUNITY)
Admission: RE | Admit: 2015-02-25 | Discharge: 2015-02-25 | Disposition: A | Discharge status: Home / Self Care | Payer: 59 | Source: Ambulatory Visit | Attending: Vascular Surgery | Admitting: Vascular Surgery

## 2015-02-25 VITALS — BP 127/81 | HR 63 | Ht 68.0 in | Wt 150.6 lb

## 2015-02-25 DIAGNOSIS — I83813 Varicose veins of bilateral lower extremities with pain: Secondary | ICD-10-CM

## 2015-02-25 DIAGNOSIS — I83893 Varicose veins of bilateral lower extremities with other complications: Secondary | ICD-10-CM | POA: Insufficient documentation

## 2015-02-25 NOTE — Progress Notes (Signed)
[facility] [place] [facility] HISTORY AND PHYSICAL   History of Present Illness:  Patient is a 64 y.o. year old female who presents for evaluation of painful varicose veins. The patient complains of swelling achiness and fullness in both lower extremities that progresses as the day goes on. 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 She primarily complains of pain over the right anterior tibial region. She states that if she elevates her legs and applies ice this does help somewhat. She takes about 6 Aleve per day. She has worn compression stockings in the past but stopped wearing them about 3 years ago. She states her legs are better in the morning. She denies claudication. She did have a surface lasering of some veins on her left anterior thigh several years ago. She has a history of varicose veins in her mother and father.  Other medical problems include fibromyalgia, migraines, reflux, irritable bowel syndrome, coronary artery disease all of which are currently stable. She denies history of diabetes hypertension or elevated cholesterol. She has never used tobacco products. She denies history of DVT.  Past Medical History  Diagnosis Date  . PONV (postoperative nausea and vomiting)   . Fibromyalgia   . Migraine headache     a. migraines and cluster;last migraine 3 mon ago  . Joint pain   . Joint swelling   . Scoliosis   . GERD (gastroesophageal reflux disease)     hasn't started taking any meds  . IBS (irritable bowel syndrome)     constipation  . Hx of colonic polyps   . Kidney stones 03/2010  . History of blood transfusion     17yrs ago  . Polycythemia   . CAD (coronary artery disease)     a. 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 05/2013 Lexi CL: EF 77%, old small inferolat scar, no ischemia.  . Myocardial infarction     Past Surgical History  Procedure Laterality Date  . Abdominal hysterectomy      part. hyst.  . Tubal ligation    . Cholecystectomy  10/28/2002    lap.  . Debridement tennis elbow    . Elbow surgery      golfer's elbow-bilateral  . Knee debridement      right  . Appendectomy      with explor. lap.  . Stapedectomy      right  . Trigger finger release  08/19/2011    Procedure: RELEASE TRIGGER FINGER/A-1 PULLEY;  Surgeon: Willa Frater III;  Location: [place];  Service: Orthopedics;  Laterality: Right;  right middle finger a-1 pulley release with tenosynovectomy  . Tonsillectomy and adenoidectomy    . Diagnostic laparoscopy    . Dilation and curettage of uterus    . Tympanoplasty    . Colonoscopy    . I&d extremity  02/25/2012    Procedure: IRRIGATION AND DEBRIDEMENT EXTREMITY;  Surgeon: Roseanne Kaufman, MD;  Location: [place];  Service: Orthopedics;  Laterality: Right;  Irrigation and Debridement and Repair Right Thumb Nerve Laceration and Assoiated Structures   . Laparotomy    . Esophagogastroduodenoscopy  09/10/2012    Procedure: ESOPHAGOGASTRODUODENOSCOPY (EGD);  Surgeon: Rogene Houston, MD;  [facility];  Service: Endoscopy;  Laterality: N/A;  730  . Left heart catheterization with coronary angiogram N/A 01/16/2013    Procedure: LEFT HEART CATHETERIZATION WITH CORONARY ANGIOGRAM;  Surgeon: Jolaine Artist, MD;  [facility] C[facility];  Service: Cardiovascular;  Laterality: N/A;    Social History History  Substance Use Topics  . Smoking status: Never Smoker   . Smokeless tobacco: Never Used  . Alcohol Use: 0.0 oz/week    0 Standard drinks or equivalent per week     Comment: rarely    Family History Family History  Problem Relation Age of Onset  . Cancer Mother     Deceased with lung CA  . Heart failure Mother   . Heart  disease Mother   . COPD Mother   . Varicose Veins Mother   . Cancer Father     Deceased with lung CA  . Heart disease Father   . Varicose Veins Father     Allergies  Allergies  Allergen Reactions  . Dilaudid [Hydromorphone Hcl] Other (See Comments)    Resp. arrest  . Morphine And Related Nausea And Vomiting  . Codeine Nausea And Vomiting and Rash     Current Outpatient Prescriptions  Medication Sig Dispense Refill  . ALPRAZolam (XANAX) 0.25 MG tablet Take 0.25 mg by mouth at bedtime as needed for anxiety. Takes 1/2 tab hs    . Ascorbic Acid (VITAMIN C PO) Take 2 tablets by mouth daily.    Marland Kitchen aspirin EC 81 MG EC tablet Take 1 tablet (81 mg total) by mouth daily.    Marland Kitchen atorvastatin (LIPITOR) 40 MG tablet Take 1 tablet (40 mg total) by mouth daily. 90 tablet 4  . beta carotene w/minerals (OCUVITE) tablet Take 1 tablet by mouth daily.    . Calcium Citrate-Vitamin D (CALCITRATE/VITAMIN D PO) Take 1 tablet by mouth daily.    Marland Kitchen doxycycline (VIBRAMYCIN) 100 MG capsule Take 1 capsule (100 mg total) by mouth 2 (two) times daily. 14 capsule 0  . hydrocortisone-pramoxine (PROCTOFOAM HC) rectal foam Place 1 applicator rectally 2 (two) times daily. 10 g 0  . ibuprofen (ADVIL,MOTRIN) 200 MG tablet Take 400 mg by mouth every 6 (six) hours as needed for pain.    . nitroGLYCERIN (NITROSTAT) 0.4 MG SL tablet Place 1 tablet (0.4 mg total) under the tongue every 5 (five) minutes as needed for chest pain. 90 tablet 0  . RANEXA 500 MG 12 hr tablet Take 2 tablets (1,000 mg total) by mouth 2 (two) times daily. 360 tablet 3  . amoxicillin-clavulanate (AUGMENTIN) 500-125 MG per tablet 1 PO BID FOR 5 DAYS (Patient not taking: Reported on 02/25/2015) 10 tablet 0  . pantoprazole (PROTONIX) 40 MG tablet TAKE 1 TABLET BY MOUTH ONCE DAILY (Patient not taking: Reported on 02/25/2015) 30 tablet 6   No current facility-administered medications for this visit.    ROS:   General:  No weight loss, Fever,  chills  HEENT: No recent headaches, no nasal bleeding, no visual changes, no sore throat  Neurologic: No dizziness, blackouts, seizures. No recent symptoms of stroke or mini- stroke. No recent episodes of slurred speech, or temporary blindness.  Cardiac: + recent episodes of chest pain/pressure, no shortness of breath at rest.  No shortness of breath with exertion.  Denies history of atrial fibrillation or irregular heartbeat  Vascular: No history of rest pain in feet.  No history of claudication.  No history of non-healing  ulcer, No history of DVT   Pulmonary: No home oxygen, no productive cough, no hemoptysis,  No asthma or wheezing  Musculoskeletal:  [ ]  Arthritis, [ ]  Low back pain,  [ ]  Joint pain  Hematologic:No history of hypercoagulable state.  No history of easy bleeding.  No history of anemia  Gastrointestinal: No hematochezia or melena,  No gastroesophageal reflux, no trouble swallowing  Urinary: [ ]  chronic Kidney disease, [ ]  on HD - [ ]  MWF or [ ]  TTHS, [ ]  Burning with urination, [ ]  Frequent urination, [ ]  Difficulty urinating;   Skin: No rashes  Psychological: No history of anxiety,  + history of depression   Physical Examination  Filed Vitals:   02/25/15 1158 02/25/15 1204  BP: 146/91 127/81  Pulse: 63   Height: 5\' 8"  (1.727 m)   Weight: 68.312 kg (150 lb 9.6 oz)   SpO2: 99%     Body mass index is 22.9 kg/(m^2).  General:  Alert and oriented, no acute distress HEENT: Normal Neck: No bruit or JVD Pulmonary: Clear to auscultation bilaterally Cardiac: Regular Rate and Rhythm without murmur Abdomen: Soft, non-tender, non-distended, no mass, no scars Skin: No rash, multiple reticular and spider-type varicosities along the posterior knee and thigh diffuse reticular is along the medial ankle bilaterally greater saphenous vein not palpable Extremity Pulses:  2+ radial, brachial, femoral, dorsalis pedis, posterior tibial pulses bilaterally Musculoskeletal: No  deformity or edema  Neurologic: Upper and lower extremity motor 5/5 and symmetric  DATA:  Patient had bilateral lower extremity venous duplex exam today. This showed evidence of reflux in the common femoral vein however the remainder of the deep and superficial system was competent. There is no evidence of DVT. I reviewed and interpreted this study.   ASSESSMENT:  Bilateral varicose veins symptomatic. However, the patient does not have evidence of superficial venous reflux. I discussed with the patient today that there really no good interventions for deep vein reflux. I did offer her potential sclerotherapy to obliterate some of the varicosities that she states causes her pain. I also offered her bilateral lower extremity compression stockings. I did explain to her that these would give her symptomatic relief.   PLAN:  Patient given a prescription today for 20-30 mm knee-high compression stockings. She will consider going to 30-40 mmHg if these do not help. She will also consider sclerotherapy of her symptomatic varicose veins in the future if she wishes. She will otherwise follow-up on a when necessary basis.  Ruta Hinds, MD [facility] Office: [phone] Pager: [phone]

## 2015-02-26 ENCOUNTER — Telehealth: Payer: Self-pay | Admitting: Cardiovascular Disease

## 2015-02-26 NOTE — Telephone Encounter (Signed)
Patient wants to know if she can have Day[facility] called in for leg pain / tg

## 2015-02-26 NOTE — Telephone Encounter (Signed)
Lm for pt to call back-cc

## 2015-02-26 NOTE — Telephone Encounter (Signed)
Increased risk of MI and stroke with this class of medications. For this reason, would prefer compression stockings are initially tried as recommended by vascular surgery. Could try low dose ibuprofen as needed, but would also not use daily.

## 2015-02-26 NOTE — Telephone Encounter (Signed)
Will forward to Dr. Koneswaran  

## 2015-03-06 ENCOUNTER — Other Ambulatory Visit: Payer: Self-pay | Admitting: Cardiovascular Disease

## 2015-03-06 ENCOUNTER — Other Ambulatory Visit (INDEPENDENT_AMBULATORY_CARE_PROVIDER_SITE_OTHER): Payer: Self-pay | Admitting: Internal Medicine

## 2015-03-24 ENCOUNTER — Encounter: Payer: Self-pay | Admitting: Vascular Surgery

## 2015-03-24 ENCOUNTER — Encounter (HOSPITAL_COMMUNITY): Payer: Self-pay

## 2015-03-30 ENCOUNTER — Encounter: Payer: Self-pay | Admitting: Cardiovascular Disease

## 2015-03-30 ENCOUNTER — Other Ambulatory Visit (HOSPITAL_COMMUNITY)
Admission: RE | Admit: 2015-03-30 | Discharge: 2015-03-30 | Disposition: A | Discharge status: Home / Self Care | Payer: 59 | Source: Ambulatory Visit | Attending: Cardiovascular Disease | Admitting: Cardiovascular Disease

## 2015-03-30 ENCOUNTER — Ambulatory Visit (INDEPENDENT_AMBULATORY_CARE_PROVIDER_SITE_OTHER): Payer: 59 | Admitting: Cardiovascular Disease

## 2015-03-30 VITALS — BP 122/62 | HR 84 | Ht 68.0 in | Wt 149.0 lb

## 2015-03-30 DIAGNOSIS — E785 Hyperlipidemia, unspecified: Secondary | ICD-10-CM

## 2015-03-30 DIAGNOSIS — I25118 Atherosclerotic heart disease of native coronary artery with other forms of angina pectoris: Secondary | ICD-10-CM | POA: Diagnosis not present

## 2015-03-30 DIAGNOSIS — R079 Chest pain, unspecified: Secondary | ICD-10-CM | POA: Diagnosis not present

## 2015-03-30 DIAGNOSIS — Z79899 Other long term (current) drug therapy: Secondary | ICD-10-CM | POA: Insufficient documentation

## 2015-03-30 DIAGNOSIS — I83893 Varicose veins of bilateral lower extremities with other complications: Secondary | ICD-10-CM

## 2015-03-30 DIAGNOSIS — M25559 Pain in unspecified hip: Secondary | ICD-10-CM

## 2015-03-30 DIAGNOSIS — R252 Cramp and spasm: Secondary | ICD-10-CM

## 2015-03-30 DIAGNOSIS — Z791 Long term (current) use of non-steroidal anti-inflammatories (NSAID): Secondary | ICD-10-CM

## 2015-03-30 LAB — BASIC METABOLIC PANEL
ANION GAP: 7 (ref 5–15)
BUN: 12 mg/dL (ref 6–20)
CO2: 29 mmol/L (ref 22–32)
CREATININE: 0.91 mg/dL (ref 0.44–1.00)
Calcium: 8.8 mg/dL — ABNORMAL LOW (ref 8.9–10.3)
Chloride: 102 mmol/L (ref 101–111)
Glucose, Bld: 109 mg/dL — ABNORMAL HIGH (ref 65–99)
Potassium: 4.2 mmol/L (ref 3.5–5.1)
Sodium: 138 mmol/L (ref 135–145)

## 2015-03-30 MED ORDER — ISOSORBIDE DINITRATE 10 MG PO TABS: 10.0000 mg | ORAL_TABLET | Freq: Three times a day (TID) | ORAL | Status: DC

## 2015-03-30 NOTE — Patient Instructions (Addendum)
Your physician recommends that you schedule a follow-up appointment in: 1 month with Dr.Koneswaran  STOP Ranexa  START Isordil 10 mg three times a day    Get BMET labs      Thank you for choosing [facility] !

## 2015-03-30 NOTE — Progress Notes (Signed)
Patient ID: Stacey Lawrence, female   DOB: Nov 22, 1950, 64 y.o.   MRN: [id number]      SUBJECTIVE: Stacey Lawrence presents for follow-up for varicose veins, chest pain, and coronary disease. She also has a history of migraine headaches.  She saw Dr. Oneida Alar in vascular surgery. He recommended compression stockings and possibly sclerotherapy to obliterate symptomatic varicose veins.  She has been bothered by bilateral hip pain and recently saw orthopedic surgery (Dr. Gladstone Lighter) who injected her hips.  She has been having chest pain frequently over the last few days and taking sublingual nitroglycerin, which she attributes to anxiety and stress over her orthopedic and pain related to her varicose veins. She has taken Aleve with nitroglycerin and her headaches have been present but not as severe.   She has right calf and bilateral feet cramping. She has leg and ankle swelling and has been using compression stockings 20-30 mmHg.  She is applying for disability, as recommended by several of her healthcare providers.   Review of Systems: As per "subjective", otherwise negative.  Allergies  Allergen Reactions  . Dilaudid [Hydromorphone Hcl] Other (See Comments)    Resp. arrest  . Morphine And Related Nausea And Vomiting  . Codeine Nausea And Vomiting and Rash    Current Outpatient Prescriptions  Medication Sig Dispense Refill  . ALPRAZolam (XANAX) 0.25 MG tablet Take 0.25 mg by mouth at bedtime as needed for anxiety. Takes 1/2 tab hs    . Ascorbic Acid (VITAMIN C PO) Take 2 tablets by mouth daily.    Marland Kitchen aspirin EC 81 MG EC tablet Take 1 tablet (81 mg total) by mouth daily.    Marland Kitchen atorvastatin (LIPITOR) 40 MG tablet Take 1 tablet (40 mg total) by mouth daily. 90 tablet 4  . Calcium Citrate-Vitamin D (CALCITRATE/VITAMIN D PO) Take 1 tablet by mouth daily.    Marland Kitchen ibuprofen (ADVIL,MOTRIN) 200 MG tablet Take 400 mg by mouth every 6 (six) hours as needed for pain.    Marland Kitchen NITROSTAT 0.4 MG SL tablet PLACE 1  TABLET UNDER THE TONGUE EVERY 5 MINUTES AS NEEDED FOR CHEST PAIN 100 tablet 1  . pantoprazole (PROTONIX) 40 MG tablet TAKE 1 TABLET BY MOUTH ONCE DAILY 30 tablet 5  . RANEXA 500 MG 12 hr tablet Take 2 tablets (1,000 mg total) by mouth 2 (two) times daily. 360 tablet 3   No current facility-administered medications for this visit.    Past Medical History  Diagnosis Date  . PONV (postoperative nausea and vomiting)   . Fibromyalgia   . Migraine headache     a. migraines and cluster;last migraine 3 mon ago  . Joint pain   . Joint swelling   . Scoliosis   . GERD (gastroesophageal reflux disease)     hasn't started taking any meds  . IBS (irritable bowel syndrome)     constipation  . Hx of colonic polyps   . Kidney stones 03/2010  . History of blood transfusion     31yrs ago  . Polycythemia   . CAD (coronary artery disease)     a. 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 05/2013 Lexi CL: EF 77%, old small inferolat scar, no ischemia.  Marland Kitchen  Myocardial infarction     Past Surgical History  Procedure Laterality Date  . Abdominal hysterectomy      part. hyst.  . Tubal ligation    . Cholecystectomy  10/28/2002    lap.  . Debridement tennis elbow    . Elbow surgery      golfer's elbow-bilateral  . Knee debridement      right  . Appendectomy      with explor. lap.  . Stapedectomy      right  . Trigger finger release  08/19/2011    Procedure: RELEASE TRIGGER FINGER/A-1 PULLEY;  Surgeon: Willa Frater III;  Location: [facility];  Service: Orthopedics;  Laterality: Right;  right middle finger a-1 pulley release with tenosynovectomy  . Tonsillectomy and adenoidectomy    . Diagnostic laparoscopy    . Dilation and curettage of  uterus    . Tympanoplasty    . Colonoscopy    . I&d extremity  02/25/2012    Procedure: IRRIGATION AND DEBRIDEMENT EXTREMITY;  Surgeon: Roseanne Kaufman, MD;  Location: [facility];  Service: Orthopedics;  Laterality: Right;  Irrigation and Debridement and Repair Right Thumb Nerve Laceration and Assoiated Structures   . Laparotomy    . Esophagogastroduodenoscopy  09/10/2012    Procedure: ESOPHAGOGASTRODUODENOSCOPY (EGD);  Surgeon: Rogene Houston, MD;  [facility]: [facility];  Service: Endoscopy;  Laterality: Stacey Lawrence;  730  . Left heart catheterization with coronary angiogram Stacey Lawrence 01/16/2013    Procedure: LEFT HEART CATHETERIZATION WITH CORONARY ANGIOGRAM;  Surgeon: Jolaine Artist, MD;  Location: [facility] CATH LAB;  Service: Cardiovascular;  Laterality: Stacey Lawrence;    History   Social History  . Marital Status: Married    Spouse Name: Stacey Lawrence  . Number of Children: Stacey Lawrence  . Years of Education: Stacey Lawrence   Occupational History  . Not on file.   Social History Main Topics  . Smoking status: Never Smoker   . Smokeless tobacco: Never Used  . Alcohol Use: 0.0 oz/week    0 Standard drinks or equivalent per week     Comment: rarely  . Drug Use: No  . Sexual Activity: Yes    Birth Control/ Protection: Surgical   Other Topics Concern  . Not on file   Social History Narrative   Lives in [place] with husband.  Grown children.  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     Filed Vitals:   03/30/15 1615  BP: 122/62  Pulse: 84  Height: 5\' 8"  (1.727 m)  Weight: 149 lb (67.586 kg)  SpO2: 96%    PHYSICAL EXAM General: NAD HEENT: Normal. Neck: No JVD, no thyromegaly. Lungs: Clear to auscultation bilaterally with normal respiratory effort. CV: Nondisplaced PMI. Regular rate and rhythm, normal S1/S2, no S3/S4, no murmur. No pretibial or periankle edema. No carotid bruit. Normal pedal pulses.  Abdomen: Soft, nontender, no hepatosplenomegaly, no distention.  Neurologic: Alert and oriented x 3.   Psych: Normal affect. Skin: Multiple superficial regions of venous thrombophlebitis on both legs, popliteal fossa, and lateral aspect of mid-thigh bilaterally. Musculoskeletal: Normal range of motion, no gross deformities. Extremities: No clubbing or cyanosis.   ECG: Most recent ECG reviewed.      ASSESSMENT AND PLAN: 1. Chest pain in the context of CAD: Given chest pain which has been more frequent as of late, I will d/c Ranexa and try isosorbide dinitrate 10 mg three times a day. In the past, she has not tolerated nitrates well, due to her  history of migraines. She is off of beta blockers due to hypotension. Continue ASA and Lipitor 40 mg.   2. Venous varicosities: Saw vascular surgery. Wearing compression stockings 20-30 mmHg which provides some relief, and offered sclerotherapy for symptoms.  3. Hyperlipidemia: HDL 73, LDL 52 on 10/20/2014. Continue Lipitor 40 mg.  4. Multiple medical problems: Applying for disability.  5. Hip pain: Given frequent use of NSAID's (discouraged to do so), will check BMET to assess renal function.  Dispo: f/u 1 month.  Time spent: 40 minutes, of which greater than 50% was spent reviewing symptoms, relevant blood tests and studies, and discussing management plan with the patient.   Kate Sable, M.D., F.A.C.C.

## 2015-04-24 ENCOUNTER — Encounter: Payer: Self-pay | Admitting: Cardiovascular Disease

## 2015-04-24 ENCOUNTER — Ambulatory Visit (INDEPENDENT_AMBULATORY_CARE_PROVIDER_SITE_OTHER): Payer: 59 | Admitting: Cardiovascular Disease

## 2015-04-24 VITALS — BP 120/70 | HR 87 | Ht 68.0 in | Wt 147.0 lb

## 2015-04-24 DIAGNOSIS — I8393 Asymptomatic varicose veins of bilateral lower extremities: Secondary | ICD-10-CM

## 2015-04-24 DIAGNOSIS — I25118 Atherosclerotic heart disease of native coronary artery with other forms of angina pectoris: Secondary | ICD-10-CM

## 2015-04-24 DIAGNOSIS — I83893 Varicose veins of bilateral lower extremities with other complications: Secondary | ICD-10-CM

## 2015-04-24 DIAGNOSIS — M797 Fibromyalgia: Secondary | ICD-10-CM

## 2015-04-24 DIAGNOSIS — R252 Cramp and spasm: Secondary | ICD-10-CM

## 2015-04-24 DIAGNOSIS — R079 Chest pain, unspecified: Secondary | ICD-10-CM | POA: Diagnosis not present

## 2015-04-24 DIAGNOSIS — Z79899 Other long term (current) drug therapy: Secondary | ICD-10-CM | POA: Diagnosis not present

## 2015-04-24 DIAGNOSIS — E785 Hyperlipidemia, unspecified: Secondary | ICD-10-CM

## 2015-04-24 NOTE — Progress Notes (Signed)
Patient ID: RIANNON MUKHERJEE, female   DOB: 07/21/1951, 64 y.o.   MRN: [id number]      SUBJECTIVE: [name] presents for follow-up for varicose veins, chest pain, and coronary disease. She also has a history of migraine headaches.  She previously saw Dr. Oneida Alar in vascular surgery. He recommended compression stockings and possibly sclerotherapy to obliterate symptomatic varicose veins.  She was bothered by headaches and nausea with isosorbide dinitrate. She would like to go back to taking Ranexa 1000 mg bid.  However, she now has chest wall tenderness. She has a history of Raynaud's phenomenon as well, with cold nose, hands, and feet.   She has been having chest pain which she primarily attributes to work-related stress, each episode lasting up to 30 seconds.  She has right calf and bilateral feet cramping. She has leg and ankle swelling and has been using knee-high compression stockings 20-30 mmHg, but now requests waist-high due to inner thigh pain from venous varicosities.  She is applying for disability, as recommended by several of her healthcare providers.   Review of Systems: As per "subjective", otherwise negative.  Allergies  Allergen Reactions  . Dilaudid [Hydromorphone Hcl] Other (See Comments)    Resp. arrest  . Morphine And Related Nausea And Vomiting  . Codeine Nausea And Vomiting and Rash    Current Outpatient Prescriptions  Medication Sig Dispense Refill  . ALPRAZolam (XANAX) 0.25 MG tablet Take 0.25 mg by mouth at bedtime as needed for anxiety. Takes 1/2 tab hs    . Ascorbic Acid (VITAMIN C PO) Take 2 tablets by mouth daily.    Marland Kitchen aspirin EC 81 MG EC tablet Take 1 tablet (81 mg total) by mouth daily.    Marland Kitchen atorvastatin (LIPITOR) 40 MG tablet Take 1 tablet (40 mg total) by mouth daily. 90 tablet 4  . Calcium Citrate-Vitamin D (CALCITRATE/VITAMIN D PO) Take 1 tablet by mouth daily.    Marland Kitchen ibuprofen (ADVIL,MOTRIN) 200 MG tablet Take 400 mg by mouth every 6 (six) hours  as needed for pain.    Marland Kitchen NITROSTAT 0.4 MG SL tablet PLACE 1 TABLET UNDER THE TONGUE EVERY 5 MINUTES AS NEEDED FOR CHEST PAIN 100 tablet 1  . pantoprazole (PROTONIX) 40 MG tablet TAKE 1 TABLET BY MOUTH ONCE DAILY 30 tablet 5   No current facility-administered medications for this visit.    Past Medical History  Diagnosis Date  . PONV (postoperative nausea and vomiting)   . Fibromyalgia   . Migraine headache     a. migraines and cluster;last migraine 3 mon ago  . Joint pain   . Joint swelling   . Scoliosis   . GERD (gastroesophageal reflux disease)     hasn't started taking any meds  . IBS (irritable bowel syndrome)     constipation  . Hx of colonic polyps   . Kidney stones 03/2010  . History of blood transfusion     80yrs ago  . Polycythemia   . CAD (coronary artery disease)     a. 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 05/2013 Lexi CL: EF 77%, old small inferolat scar, no ischemia.  . Myocardial infarction     Past Surgical History  Procedure Laterality  Date  . Abdominal hysterectomy      part. hyst.  . Tubal ligation    . Cholecystectomy  10/28/2002    lap.  . Debridement tennis elbow    . Elbow surgery      golfer's elbow-bilateral  . Knee debridement      right  . Appendectomy      with explor. lap.  . Stapedectomy      right  . Trigger finger release  08/19/2011    Procedure: RELEASE TRIGGER FINGER/A-1 PULLEY;  Surgeon: Willa Frater III;  Location: [facility];  Service: Orthopedics;  Laterality: Right;  right middle finger a-1 pulley release with tenosynovectomy  . Tonsillectomy and adenoidectomy    . Diagnostic laparoscopy    . Dilation and curettage of uterus    . Tympanoplasty    . Colonoscopy    .  I&d extremity  02/25/2012    Procedure: IRRIGATION AND DEBRIDEMENT EXTREMITY;  Surgeon: Roseanne Kaufman, MD;  Location: [facility];  Service: Orthopedics;  Laterality: Right;  Irrigation and Debridement and Repair Right Thumb Nerve Laceration and Assoiated Structures   . Laparotomy    . Esophagogastroduodenoscopy  09/10/2012    Procedure: ESOPHAGOGASTRODUODENOSCOPY (EGD);  Surgeon: Rogene Houston, MD;  [facility]: [facility];  Service: Endoscopy;  Laterality: N/A;  730  . Left heart catheterization with coronary angiogram N/A 01/16/2013    Procedure: LEFT HEART CATHETERIZATION WITH CORONARY ANGIOGRAM;  Surgeon: Jolaine Artist, MD;  Location: [facility] CATH LAB;  Service: Cardiovascular;  Laterality: N/A;    History   Social History  . Marital Status: Married    Spouse Name: N/A  . Number of Children: N/A  . Years of Education: N/A   Occupational History  . Not on file.   Social History Main Topics  . Smoking status: Never Smoker   . Smokeless tobacco: Never Used  . Alcohol Use: 0.0 oz/week    0 Standard drinks or equivalent per week     Comment: rarely  . Drug Use: No  . Sexual Activity: Yes    Birth Control/ Protection: Surgical   Other Topics Concern  . Not on file   Social History Narrative   Lives in [place] with husband.  Grown children.  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     Filed Vitals:   04/24/15 1323  BP: 120/70  Pulse: 87  Height: 5\' 8"  (1.727 m)  Weight: 147 lb (66.679 kg)  SpO2: 96%    PHYSICAL EXAM General: NAD HEENT: Normal. Neck: No JVD, no thyromegaly. Lungs: Clear to auscultation bilaterally with normal respiratory effort. CV: Nondisplaced PMI. Regular rate and rhythm, normal S1/S2, no S3/S4, no murmur. No pretibial or periankle edema. No carotid bruit. Normal pedal pulses.  Abdomen: Soft, nontender, no hepatosplenomegaly, no distention.  Neurologic: Alert and oriented x 3.  Psych: Normal affect. Skin: Multiple superficial  regions of venous thrombophlebitis on both legs, popliteal fossa, and lateral aspect of mid-thigh bilaterally. Musculoskeletal: Normal range of motion, no gross deformities. Extremities: No clubbing or cyanosis.   ECG: Most recent ECG reviewed.      ASSESSMENT AND PLAN: 1. Chest pain in the context of CAD: Developed side effects from short-acting nitrates. Will d/c and restart Ranexa 1000 mg bid as per her request. However, she also has concomitant chest wall tenderness and I believe her pain is musculoskeletal and stress-related to a large degree.  She is off of beta blockers due to prior  issues with hypotension. Continue ASA and Lipitor 40 mg.   2. Venous varicosities: Saw vascular surgery. Wearing compression stockings 20-30 mmHg which provides some relief, and offered sclerotherapy for symptoms. Requests waist-high stockings due to medial thigh pain, which I will order. Has lidocaine ointment which she may try and use as well.  3. Hyperlipidemia: HDL 73, LDL 52 on 10/20/2014. Continue Lipitor 40 mg.  4. Multiple medical problems: Applying for disability.   Dispo: f/u 2 months.   Kate Sable, M.D., F.A.C.C.

## 2015-04-24 NOTE — Patient Instructions (Addendum)
Your physician recommends that you schedule a follow-up appointment in:  8 weeks with Dr Bronson Ing    STOP Isordil   Re-start Ranexa 1,000 mg twice a day    We have provided you with Rx for compression hose    Thank you for choosing [facility] !

## 2015-06-09 ENCOUNTER — Other Ambulatory Visit (HOSPITAL_COMMUNITY): Payer: Self-pay | Admitting: Orthopedic Surgery

## 2015-06-09 DIAGNOSIS — M5440 Lumbago with sciatica, unspecified side: Secondary | ICD-10-CM

## 2015-06-11 ENCOUNTER — Ambulatory Visit (HOSPITAL_COMMUNITY)
Admission: RE | Admit: 2015-06-11 | Discharge: 2015-06-11 | Disposition: A | Discharge status: Home / Self Care | Payer: 59 | Source: Ambulatory Visit | Attending: Orthopedic Surgery | Admitting: Orthopedic Surgery

## 2015-06-11 ENCOUNTER — Telehealth: Payer: Self-pay | Admitting: Cardiovascular Disease

## 2015-06-11 DIAGNOSIS — M5440 Lumbago with sciatica, unspecified side: Secondary | ICD-10-CM

## 2015-06-11 DIAGNOSIS — M545 Low back pain: Secondary | ICD-10-CM | POA: Diagnosis present

## 2015-06-11 NOTE — Telephone Encounter (Signed)
[place] paper work in Dr. Marthann Schiller folder to be filled out

## 2015-06-17 ENCOUNTER — Emergency Department (HOSPITAL_COMMUNITY): Payer: 59

## 2015-06-17 ENCOUNTER — Encounter (HOSPITAL_COMMUNITY): Payer: Self-pay | Admitting: Emergency Medicine

## 2015-06-17 ENCOUNTER — Emergency Department (HOSPITAL_COMMUNITY)
Admission: EM | Admit: 2015-06-17 | Discharge: 2015-06-17 | Disposition: A | Discharge status: Home / Self Care | Payer: 59 | Attending: Emergency Medicine | Admitting: Emergency Medicine

## 2015-06-17 DIAGNOSIS — M419 Scoliosis, unspecified: Secondary | ICD-10-CM | POA: Diagnosis not present

## 2015-06-17 DIAGNOSIS — Z9889 Other specified postprocedural states: Secondary | ICD-10-CM | POA: Insufficient documentation

## 2015-06-17 DIAGNOSIS — Z7982 Long term (current) use of aspirin: Secondary | ICD-10-CM | POA: Insufficient documentation

## 2015-06-17 DIAGNOSIS — Z79899 Other long term (current) drug therapy: Secondary | ICD-10-CM | POA: Insufficient documentation

## 2015-06-17 DIAGNOSIS — I252 Old myocardial infarction: Secondary | ICD-10-CM | POA: Insufficient documentation

## 2015-06-17 DIAGNOSIS — Z87442 Personal history of urinary calculi: Secondary | ICD-10-CM | POA: Diagnosis not present

## 2015-06-17 DIAGNOSIS — Z86010 Personal history of colonic polyps: Secondary | ICD-10-CM | POA: Insufficient documentation

## 2015-06-17 DIAGNOSIS — K219 Gastro-esophageal reflux disease without esophagitis: Secondary | ICD-10-CM | POA: Diagnosis not present

## 2015-06-17 DIAGNOSIS — I25118 Atherosclerotic heart disease of native coronary artery with other forms of angina pectoris: Secondary | ICD-10-CM

## 2015-06-17 DIAGNOSIS — R079 Chest pain, unspecified: Secondary | ICD-10-CM | POA: Diagnosis not present

## 2015-06-17 DIAGNOSIS — Z862 Personal history of diseases of the blood and blood-forming organs and certain disorders involving the immune mechanism: Secondary | ICD-10-CM | POA: Insufficient documentation

## 2015-06-17 DIAGNOSIS — I2510 Atherosclerotic heart disease of native coronary artery without angina pectoris: Secondary | ICD-10-CM | POA: Diagnosis not present

## 2015-06-17 DIAGNOSIS — I2119 ST elevation (STEMI) myocardial infarction involving other coronary artery of inferior wall: Secondary | ICD-10-CM | POA: Insufficient documentation

## 2015-06-17 LAB — COMPREHENSIVE METABOLIC PANEL
ALBUMIN: 3.8 g/dL (ref 3.5–5.0)
ALT: 16 U/L (ref 14–54)
ANION GAP: 10 (ref 5–15)
AST: 21 U/L (ref 15–41)
Alkaline Phosphatase: 76 U/L (ref 38–126)
BILIRUBIN TOTAL: 1.1 mg/dL (ref 0.3–1.2)
BUN: 15 mg/dL (ref 6–20)
CALCIUM: 9.1 mg/dL (ref 8.9–10.3)
CO2: 24 mmol/L (ref 22–32)
Chloride: 103 mmol/L (ref 101–111)
Creatinine, Ser: 0.85 mg/dL (ref 0.44–1.00)
GFR calc Af Amer: >60 mL/min (ref >60)
Glucose, Bld: 126 mg/dL — ABNORMAL HIGH (ref 65–99)
POTASSIUM: 3.6 mmol/L (ref 3.5–5.1)
Sodium: 137 mmol/L (ref 135–145)
TOTAL PROTEIN: 6.2 g/dL — AB (ref 6.5–8.1)

## 2015-06-17 LAB — CBC
HEMATOCRIT: 45.5 % (ref 36.0–46.0)
Hemoglobin: 15.6 g/dL — ABNORMAL HIGH (ref 12.0–15.0)
MCH: 31.4 pg (ref 26.0–34.0)
MCHC: 34.3 g/dL (ref 30.0–36.0)
MCV: 91.5 fL (ref 78.0–100.0)
PLATELETS: 194 10*3/uL (ref 150–400)
RBC: 4.97 MIL/uL (ref 3.87–5.11)
RDW: 12.6 % (ref 11.5–15.5)
WBC: 7.5 10*3/uL (ref 4.0–10.5)

## 2015-06-17 LAB — TROPONIN I

## 2015-06-17 LAB — I-STAT TROPONIN, ED: Troponin i, poc: 0 ng/mL (ref 0.00–0.08)

## 2015-06-17 NOTE — H&P (Signed)
Stacey Lawrence is an 64 y.o. female.    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 EF 55% LAD with 30% stenosis.  nuc several months later with EF 77% and low risk study, old sm inferolateral scar.   On Echo from 2014 EF 55-60% and PA pk pressure of 34 mmHg.  She has varicosities, migraines and was having chest pain when last saw Dr. Jacinta Shoe in August.  Ranexa was increased from once a day to twice a day at that time.  She also had chest wall tenderness.  She is off BB due to hypotension.  Lipids are controlled on lipitor 40.    Today presents with increased chest tightness 10/10 that occurred today.  first she had nausea and significant leg cramping then diaphoresis then chest pain with associated SOB.  Her HR went up to 90 usually not that fast.  Her hands began to tingle.  She talked to office nurse and EMS was called.  This pain was much more intense than her daily chest pain.  She normally has a few episode daily that only last seconds to minutes.  Over the last month she has taken a total of 4-5 NTG.  Other than today no SOB.  She does not like to take NTG due to her hx of migraines.  Currently she is pain free.  She was given 3 baby asa by EMS.    She has also rec'd zofran for nausea.    Troponin negative.  EKG without acute changes 1st degree AV block.   Past Medical History  Diagnosis Date  . PONV (postoperative nausea and vomiting)   . Fibromyalgia   . Migraine headache     a. migraines and cluster;last migraine 3 mon ago  . Joint pain   . Joint swelling   . Scoliosis   . GERD (gastroesophageal reflux disease)     hasn't started taking any meds  . IBS (irritable bowel syndrome)     constipation  . Hx of colonic polyps     . Kidney stones 03/2010  . History of blood transfusion     64yr ago  . Polycythemia   . CAD (coronary artery disease)     a. 12/2012 NSTEMI/Cath: LM anomalous, arising in R cusp ant to RCA, otw nl, LAD [address] bridge, RI nl, LCX nl, OM2 small, subtl occl w/ thrombus distally - felt to be spont dissection, too small for PCI->Med Rx, RCA large, dom, nl, PDA/PD nl, EF 55% w/ focal HK in mid-dist antlat wall;  b. 05/2013 Lexi CL: EF 77%, old small inferolat scar, no ischemia.  . Myocardial infarction     Past Surgical History  Procedure Laterality Date  . Abdominal hysterectomy      part. hyst.  . Tubal ligation    . Cholecystectomy  10/28/2002    lap.  . Debridement tennis elbow    . Elbow surgery      golfer's elbow-bilateral  . Knee debridement      right  . Appendectomy      with explor. lap.  . Stapedectomy      right  . Trigger finger release  08/19/2011    Procedure:  RELEASE TRIGGER FINGER/A-1 PULLEY;  Surgeon: Willa Frater III;  Location: [facility];  Service: Orthopedics;  Laterality: Right;  right middle finger a-1 pulley release with tenosynovectomy  . Tonsillectomy and adenoidectomy    . Diagnostic laparoscopy    . Dilation and curettage of uterus    . Tympanoplasty    . Colonoscopy    . I&d extremity  02/25/2012    Procedure: IRRIGATION AND DEBRIDEMENT EXTREMITY;  Surgeon: Roseanne Kaufman, MD;  Location: [facility];  Service: Orthopedics;  Laterality: Right;  Irrigation and Debridement and Repair Right Thumb Nerve Laceration and Assoiated Structures   . Laparotomy    . Esophagogastroduodenoscopy  09/10/2012    Procedure: ESOPHAGOGASTRODUODENOSCOPY (EGD);  Surgeon: Rogene Houston, MD;  [facility];  Service: Endoscopy;  Laterality: N/A;  730  . Left heart catheterization with coronary angiogram N/A 01/16/2013    Procedure: LEFT HEART CATHETERIZATION WITH CORONARY ANGIOGRAM;  Surgeon: Jolaine Artist, MD;  Location: [facility] C[facility];  Service:  Cardiovascular;  Laterality: N/A;    Family History  Problem Relation Age of Onset  . Cancer Mother     Deceased with lung CA  . Heart failure Mother   . Heart disease Mother   . COPD Mother   . Varicose Veins Mother   . Cancer Father     Deceased with lung CA  . Heart disease Father   . Varicose Veins Father    Social History:  reports that she has never smoked. She has never used smokeless tobacco. She reports that she drinks alcohol. She reports that she does not use illicit drugs.  Allergies:  Allergies  Allergen Reactions  . Dilaudid [Hydromorphone Hcl] Other (See Comments)    Resp. arrest  . Morphine And Related Nausea And Vomiting  . Codeine Nausea And Vomiting and Rash    OUTPATIENT MEDICATIONS: No current facility-administered medications on file prior to encounter.   Current Outpatient Prescriptions on File Prior to Encounter  Medication Sig Dispense Refill  . ALPRAZolam (XANAX) 0.25 MG tablet Take 0.25 mg by mouth at bedtime as needed for anxiety. Takes 1/2 tab qhs prn sleep    . Ascorbic Acid (VITAMIN C PO) Take 2 tablets by mouth daily.    Marland Kitchen aspirin EC 81 MG EC tablet Take 1 tablet (81 mg total) by mouth daily.    Marland Kitchen atorvastatin (LIPITOR) 40 MG tablet Take 1 tablet (40 mg total) by mouth daily. 90 tablet 4  . Calcium Citrate-Vitamin D (CALCITRATE/VITAMIN D PO) Take 1 tablet by mouth daily.    Marland Kitchen ibuprofen (ADVIL,MOTRIN) 200 MG tablet Take 400 mg by mouth every 6 (six) hours as needed for pain.    Marland Kitchen NITROSTAT 0.4 MG SL tablet PLACE 1 TABLET UNDER THE TONGUE EVERY 5 MINUTES AS NEEDED FOR CHEST PAIN 100 tablet 1  . pantoprazole (PROTONIX) 40 MG tablet TAKE 1 TABLET BY MOUTH ONCE DAILY 30 tablet 5  . ranolazine (RANEXA) 1000 MG SR tablet Take 1,000 mg by mouth 2 (two) times daily.       Results for orders placed or performed during the hospital encounter of 06/17/15 (from the past 48 hour(s))  I-stat troponin, ED (not at [facility])     Status: None   Collection  Time: 06/17/15  2:05 PM  Result Value Ref Range   Troponin i, poc 0.00 0.00 - 0.08 ng/mL   Comment 3            Comment: Due to  the release kinetics of cTnI, a negative result within the first hours of the onset of symptoms does not rule out myocardial infarction with certainty. If myocardial infarction is still suspected, repeat the test at appropriate intervals.   CBC     Status: Abnormal   Collection Time: 06/17/15  2:08 PM  Result Value Ref Range   WBC 7.5 4.0 - 10.5 K/uL   RBC 4.97 3.87 - 5.11 MIL/uL   Hemoglobin 15.6 (H) 12.0 - 15.0 g/dL   HCT 45.5 36.0 - 46.0 %   MCV 91.5 78.0 - 100.0 fL   MCH 31.4 26.0 - 34.0 pg   MCHC 34.3 30.0 - 36.0 g/dL   RDW 12.6 11.5 - 15.5 %   Platelets 194 150 - 400 K/uL  Comprehensive metabolic panel     Status: Abnormal   Collection Time: 06/17/15  2:08 PM  Result Value Ref Range   Sodium 137 135 - 145 mmol/L   Potassium 3.6 3.5 - 5.1 mmol/L   Chloride 103 101 - 111 mmol/L   CO2 24 22 - 32 mmol/L   Glucose, Bld 126 (H) 65 - 99 mg/dL   BUN 15 6 - 20 mg/dL   Creatinine, Ser 0.85 0.44 - 1.00 mg/dL   Calcium 9.1 8.9 - 10.3 mg/dL   Total Protein 6.2 (L) 6.5 - 8.1 g/dL   Albumin 3.8 3.5 - 5.0 g/dL   AST 21 15 - 41 U/L   ALT 16 14 - 54 U/L   Alkaline Phosphatase 76 38 - 126 U/L   Total Bilirubin 1.1 0.3 - 1.2 mg/dL   GFR calc non Af Amer >60 >60 mL/min   GFR calc Af Amer >60 >60 mL/min    Comment: (NOTE) The eGFR has been calculated using the CKD EPI equation. This calculation has not been validated in all clinical situations. eGFR's persistently <60 mL/min signify possible Chronic Kidney Disease.    Anion gap 10 5 - 15   Dg Chest 2 View  06/17/2015   CLINICAL DATA:  Chest pain, shortness of breath and vomiting today.  EXAM: CHEST  2 VIEW  COMPARISON:  PA and lateral chest 06/03/2013.  FINDINGS: The lungs are clear. Heart size is normal. No pneumothorax or pleural effusion. No focal bony abnormality.  IMPRESSION: Negative chest.    Electronically Signed   By: Inge Rise M.D.   On: 06/17/2015 15:20    ROS: General:no colds or fevers, no weight changes, only sleeps 4 hours a night even with xanax Skin:no rashes or ulcers HEENT:no blurred vision, no congestion CV:see HPI PUL:see HPI GI:no diarrhea constipation or melena, no indigestion GU:no hematuria, no dysuria MS:no joint pain, recent back pain, no claudication Neuro:no syncope, no lightheadedness Endo:no diabetes, no thyroid disease   Blood pressure 119/67, pulse 73, temperature 98.2 F (36.8 C), temperature source Oral, resp. rate 16, height 5' 8"  (1.727 m), weight 146 lb (66.225 kg), SpO2 97 %. PE:  General:Pleasant affect, NAD Skin:Warm and dry, brisk capillary refill HEENT:normocephalic, sclera clear, mucus membranes moist Neck:supple, no JVD, no bruits  Heart:S1S2 RRR without murmur, gallup, rub or click Lungs:clear without rales, rhonchi, or wheezes WVP:XTGG, non tender, + BS, do not palpate liver spleen or masses Ext:no lower ext edema, 2+ pedal pulses, 2+ radial pulses Neuro:alert and oriented X 3, MAE, follows commands, + facial symmetry   Assessment/Plan Principal Problem:   Chest pain at rest- began with nausea and leg cramps.  Chest pain came later.  Will check another troponin  now   Active Problems:   CAD (coronary artery disease)    Hx MI with closure of OM2 in 2014- too small to intervene upon.  nuc 05/2013 without ischemia but + scar.    [place] Nurse Practitioner Certified [place] Pager [phone] or after 5pm or weekends call [phone] 06/17/2015, 3:59 PM  Patient seen and examined and history reviewed. Agree with above findings and plan. Pleasant 64 yo WF with history noted above. Today after bending over she developed acute nausea and leg cramps. She then complained of diaphoresis and sweating. She then developed chest pain that was more severe than her usual pain. She is now pain free. Noted  symptoms were improving when she came to ED. She does have chronic chest pain on a daily basis. This usually lasts less than 2 minutes. She does use occasional Ntg. She has a history of GERD but states she stays on top of this with a PPI.  On exam she has clear lungs. No gallop, murmur, or rub. No abdominal or chest wall pain to palpation. Ecg is normal. Initial troponin is normal.  Her pain is quite atypical. I doubt this is cardiac related. Sounds more GI in etiology. She does have an interesting cardiac history with small inferolateral MI in April 2014. I reviewed her films and I suspect she may have had spontaneous coronary dissection (SCAD) of the OM. Follow up Myoview 5 months later showed a small scar with no ischemia. 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 Review of her CT at that time indicates there is a posterior course around the aorta and not intra-arterial. We will check another troponin level now. If normal then she can be DC from the ED. She has follow up appointment with Dr Bronson Ing on Friday.  Peter Martinique, [place] 06/17/2015 5:19 PM

## 2015-06-17 NOTE — ED Notes (Signed)
Gave pt sprite. Pt resting in bed with family at bedside

## 2015-06-17 NOTE — ED Provider Notes (Signed)
CSN: [id number]     Arrival date & time 06/17/15  1332 History   First MD Initiated Contact with Patient 06/17/15 1348     Chief Complaint  Patient presents with  . Chest Pain      Patient is a 64 y.o. female presenting with chest pain. The history is provided by the patient. No language interpreter was used.  Chest Pain  Ms. Darryl Lent has a history of coronary artery disease is not amenable to stenting. Today around 1210 she developed nausea diaphoresis, central chest discomfort described as a tightness. Symptoms occurred with minimal activity. She did have associated shortness of breath. Her symptoms stopped on ED arrival. She did receive 4 baby aspirin by EMS prior to ED arrival. Symptoms are moderate, ,  Currently resolved. Past Medical History  Diagnosis Date  . PONV (postoperative nausea and vomiting)   . Fibromyalgia   . Migraine headache     a. migraines and cluster;last migraine 3 mon ago  . Joint pain   . Joint swelling   . Scoliosis   . GERD (gastroesophageal reflux disease)     hasn't started taking any meds  . IBS (irritable bowel syndrome)     constipation  . Hx of colonic polyps   . Kidney stones 03/2010  . History of blood transfusion     42yrs ago  . Polycythemia   . CAD (coronary artery disease)     a. 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 05/2013 Lexi CL: EF 77%, old small inferolat scar, no ischemia.  . Myocardial infarction    Past Surgical History  Procedure Laterality Date  . Abdominal hysterectomy      part. hyst.  . Tubal ligation    . Cholecystectomy  10/28/2002    lap.  . Debridement tennis elbow    . Elbow surgery      golfer's elbow-bilateral  . Knee debridement      right  . Appendectomy      with  explor. lap.  . Stapedectomy      right  . Trigger finger release  08/19/2011    Procedure: RELEASE TRIGGER FINGER/A-1 PULLEY;  Surgeon: Willa Frater III;  Location: [facility];  Service: Orthopedics;  Laterality: Right;  right middle finger a-1 pulley release with tenosynovectomy  . Tonsillectomy and adenoidectomy    . Diagnostic laparoscopy    . Dilation and curettage of uterus    . Tympanoplasty    . Colonoscopy    . I&d extremity  02/25/2012    Procedure: IRRIGATION AND DEBRIDEMENT EXTREMITY;  Surgeon: Roseanne Kaufman, MD;  Location: [facility];  Service: Orthopedics;  Laterality: Right;  Irrigation and Debridement and Repair Right Thumb Nerve Laceration and Assoiated Structures   . Laparotomy    . Esophagogastroduodenoscopy  09/10/2012    Procedure: ESOPHAGOGASTRODUODENOSCOPY (EGD);  Surgeon: Rogene Houston, MD;  [facility];  Service: Endoscopy;  Laterality: N/A;  730  . Left heart catheterization with coronary angiogram N/A 01/16/2013    Procedure: LEFT HEART CATHETERIZATION WITH CORONARY ANGIOGRAM;  Surgeon: Jolaine Artist, MD;  Location: [facility] CATH LAB;  Service: Cardiovascular;  Laterality: N/A;   Family History  Problem Relation Age  of Onset  . Cancer Mother     Deceased with lung CA  . Heart failure Mother   . Heart disease Mother   . COPD Mother   . Varicose Veins Mother   . Cancer Father     Deceased with lung CA  . Heart disease Father   . Varicose Veins Father    Social History  Substance Use Topics  . Smoking status: Never Smoker   . Smokeless tobacco: Never Used  . Alcohol Use: 0.0 oz/week    0 Standard drinks or equivalent per week     Comment: rarely   OB History    No data available     Review of Systems  Cardiovascular: Positive for chest pain.  All other systems reviewed and are negative.     Allergies  Dilaudid; Morphine and related; and Codeine  Home Medications   Prior to Admission medications   Medication Sig  Start Date End Date Taking? Authorizing Provider  ALPRAZolam Duanne Moron) 0.25 MG tablet Take 0.25 mg by mouth at bedtime as needed for anxiety. Takes 1/2 tab hs    Historical Provider, MD  Ascorbic Acid (VITAMIN C PO) Take 2 tablets by mouth daily.    Historical Provider, MD  aspirin EC 81 MG EC tablet Take 1 tablet (81 mg total) by mouth daily. 01/18/13   Rogelia Mire, NP  atorvastatin (LIPITOR) 40 MG tablet Take 1 tablet (40 mg total) by mouth daily. 02/20/15   Herminio Commons, MD  Calcium Citrate-Vitamin D (CALCITRATE/VITAMIN D PO) Take 1 tablet by mouth daily.    Historical Provider, MD  ibuprofen (ADVIL,MOTRIN) 200 MG tablet Take 400 mg by mouth every 6 (six) hours as needed for pain.    Historical Provider, MD  NITROSTAT 0.4 MG SL tablet PLACE 1 TABLET UNDER THE TONGUE EVERY 5 MINUTES AS NEEDED FOR CHEST PAIN 03/06/15   Herminio Commons, MD  pantoprazole (PROTONIX) 40 MG tablet TAKE 1 TABLET BY MOUTH ONCE DAILY 03/06/15   Rogene Houston, MD  ranolazine (RANEXA) 1000 MG SR tablet Take 1,000 mg by mouth 2 (two) times daily.    Historical Provider, MD   BP 131/76 mmHg  Temp(Src) 98.2 F (36.8 C) (Oral)  Resp 12  Ht 5\' 8"  (1.727 m)  Wt 146 lb (66.225 kg)  BMI 22.20 kg/m2  SpO2 99% Physical Exam  Constitutional: She is oriented to person, place, and time. She appears well-developed and well-nourished.  HENT:  Head: Normocephalic and atraumatic.  Cardiovascular: Normal rate and regular rhythm.   No murmur heard. Pulmonary/Chest: Effort normal and breath sounds normal. No respiratory distress.  Abdominal: Soft. There is no tenderness. There is no rebound and no guarding.  Musculoskeletal: She exhibits no edema or tenderness.  Neurological: She is alert and oriented to person, place, and time.  Skin: Skin is warm and dry.  Psychiatric: She has a normal mood and affect. Her behavior is normal.  Nursing note and vitals reviewed.   ED Course  Procedures (including critical care  time) Labs Review Labs Reviewed  CBC - Abnormal; Notable for the following:    Hemoglobin 15.6 (*)    All other components within normal limits  COMPREHENSIVE METABOLIC PANEL - Abnormal; Notable for the following:    Glucose, Bld 126 (*)    Total Protein 6.2 (*)    All other components within normal limits  Randolm Idol, ED    Imaging Review Dg Chest 2 View  06/17/2015   CLINICAL  DATA:  Chest pain, shortness of breath and vomiting today.  EXAM: CHEST  2 VIEW  COMPARISON:  PA and lateral chest 06/03/2013.  FINDINGS: The lungs are clear. Heart size is normal. No pneumothorax or pleural effusion. No focal bony abnormality.  IMPRESSION: Negative chest.   Electronically Signed   By: Inge Rise M.D.   On: 06/17/2015 15:20   I have personally reviewed and evaluated these images and lab results as part of my medical decision-making.   EKG Interpretation   Date/Time:  Wednesday June 17 2015 13:46:53 EDT Ventricular Rate:  67 PR Interval:  217 QRS Duration: 108 QT Interval:  402 QTC Calculation: 424 R Axis:   -36 Text Interpretation:  Sinus rhythm Borderline prolonged PR interval Left  axis deviation RSR' in V1 or V2, right VCD or RVH Confirmed by Hazle Coca  [phone]) on 06/17/2015 2:13:06 PM      MDM   Final diagnoses:  Chest pain, unspecified chest pain type    Patient here with chest pain, has a history of coronary artery disease that is not amenable to stenting. Her pain is free on emergency department evaluation. Cardiology consultation for evaluation. On repeat evaluation in the emergency Department she continues to be pain-free. History and presentation is not consistent with pulmonary embolism, CHF.    Quintella Reichert, MD 06/17/15 [phone]

## 2015-06-17 NOTE — ED Notes (Signed)
Pt has sudden onset of nausea then pain that shot down to her legs. Pt complains of chest tightness. Pt felt SOB and diaphoretic. Pt received 324mg  ASA, 4mg  zofran, refused nitro due to it causing migraine. BP 125/76, HR 76, no EKG changes per EMS

## 2015-06-17 NOTE — Discharge Instructions (Signed)

## 2015-06-18 ENCOUNTER — Telehealth: Payer: Self-pay | Admitting: Cardiovascular Disease

## 2015-06-18 NOTE — Telephone Encounter (Signed)
Per Dr Bronson Ing, pt does not need to repeat  ,pt made aware

## 2015-06-18 NOTE — Telephone Encounter (Signed)
Potassium yesterday 3.6,does it need repeating

## 2015-06-18 NOTE — Telephone Encounter (Signed)
Pt was at [facility] all day yesterday and she would like her potassium drawn prior to her apt tomorrow

## 2015-06-19 ENCOUNTER — Encounter: Payer: Self-pay | Admitting: Cardiovascular Disease

## 2015-06-19 ENCOUNTER — Ambulatory Visit (INDEPENDENT_AMBULATORY_CARE_PROVIDER_SITE_OTHER): Payer: 59 | Admitting: Cardiovascular Disease

## 2015-06-19 ENCOUNTER — Ambulatory Visit (HOSPITAL_COMMUNITY): Payer: 59

## 2015-06-19 VITALS — BP 110/64 | HR 73 | Ht 68.0 in | Wt 144.6 lb

## 2015-06-19 DIAGNOSIS — I25118 Atherosclerotic heart disease of native coronary artery with other forms of angina pectoris: Secondary | ICD-10-CM | POA: Diagnosis not present

## 2015-06-19 DIAGNOSIS — R079 Chest pain, unspecified: Secondary | ICD-10-CM

## 2015-06-19 DIAGNOSIS — E785 Hyperlipidemia, unspecified: Secondary | ICD-10-CM | POA: Diagnosis not present

## 2015-06-19 DIAGNOSIS — Z79899 Other long term (current) drug therapy: Secondary | ICD-10-CM

## 2015-06-19 DIAGNOSIS — I83893 Varicose veins of bilateral lower extremities with other complications: Secondary | ICD-10-CM

## 2015-06-19 DIAGNOSIS — Z9289 Personal history of other medical treatment: Secondary | ICD-10-CM

## 2015-06-19 DIAGNOSIS — Z87898 Personal history of other specified conditions: Secondary | ICD-10-CM

## 2015-06-19 MED ORDER — RANOLAZINE ER 500 MG PO TB12: 500.0000 mg | ORAL_TABLET | Freq: Two times a day (BID) | ORAL | Status: DC

## 2015-06-19 NOTE — Progress Notes (Signed)
Patient ID: Stacey Lawrence, female   DOB: July 10, 1951, 64 y.o.   MRN: [id number]      SUBJECTIVE: Stacey Lawrence presents for follow-up for varicose veins, chest pain, and coronary disease. She also has a history of migraine headaches.  She previously saw Stacey Lawrence in vascular surgery. He recommended compression stockings and possibly sclerotherapy to obliterate symptomatic varicose veins.  She was bothered by headaches and nausea with isosorbide dinitrate and is taking Ranexa 1000 mg bid.  She also has a history of Raynaud's phenomenon as well, with cold nose, hands, and feet.   She has right calf and bilateral feet cramping. She has leg and ankle swelling and has been using waist-high compression stockings due to inner thigh pain from venous varicosities.  She is applying for disability, as recommended by several of her healthcare providers.  She was evaluated in the ED two days ago for chest pain, diaphoresis, and bilateral hand tingling/numbness which was deemed atypical for a cardiac etiology. Stacey Lawrence evaluated her. Troponins were normal as was chest xray. Upon further discussion, she had had a very emotionally stressful week as she had helped arrange a wedding as well as cook for the wedding reception. When asked her if it could've been a panic attack, she says it was not similar to prior panic attacks.   Review of Systems: As per "subjective", otherwise negative.  Allergies  Allergen Reactions  . Dilaudid [Hydromorphone Hcl] Other (See Comments)    Resp. arrest  . Morphine And Related Nausea And Vomiting  . Codeine Nausea And Vomiting and Rash    Current Outpatient Prescriptions  Medication Sig Dispense Refill  . ALPRAZolam (XANAX) 0.25 MG tablet Take 0.25 mg by mouth at bedtime as needed for anxiety. Takes 1/2 tab qhs prn sleep    . Ascorbic Acid (VITAMIN C PO) Take 2 tablets by mouth daily.    Marland Kitchen aspirin EC 81 MG EC tablet Take 1 tablet (81 mg total) by mouth daily.    Marland Kitchen  atorvastatin (LIPITOR) 40 MG tablet Take 1 tablet (40 mg total) by mouth daily. 90 tablet 4  . Calcium Citrate-Vitamin D (CALCITRATE/VITAMIN D PO) Take 1 tablet by mouth daily.    Marland Kitchen ibuprofen (ADVIL,MOTRIN) 200 MG tablet Take 400 mg by mouth every 6 (six) hours as needed for pain.    Marland Kitchen NITROSTAT 0.4 MG SL tablet PLACE 1 TABLET UNDER THE TONGUE EVERY 5 MINUTES AS NEEDED FOR CHEST PAIN 100 tablet 1  . pantoprazole (PROTONIX) 40 MG tablet TAKE 1 TABLET BY MOUTH ONCE DAILY 30 tablet 5  . ranolazine (RANEXA) 1000 MG SR tablet Take 1,000 mg by mouth 2 (two) times daily.     No current facility-administered medications for this visit.    Past Medical History  Diagnosis Date  . PONV (postoperative nausea and vomiting)   . Fibromyalgia   . Migraine headache     a. migraines and cluster;last migraine 3 mon ago  . Joint pain   . Joint swelling   . Scoliosis   . GERD (gastroesophageal reflux disease)     hasn't started taking any meds  . IBS (irritable bowel syndrome)     constipation  . Hx of colonic polyps   . Kidney stones 03/2010  . History of blood transfusion     34yrs ago  . Polycythemia   . CAD (coronary artery disease)     a. 12/2012 NSTEMI/Cath: LM anomalous, arising in R cusp ant to RCA, otw nl, LAD 88m  with bridge, RI nl, LCX nl, OM2 small, subtl occl w/ thrombus distally - felt to be spont dissection, too small for PCI->Med Rx, RCA large, dom, nl, PDA/PD nl, EF 55% w/ focal HK in mid-dist antlat wall;  b. 05/2013 Lexi CL: EF 77%, old small inferolat scar, no ischemia.  . Myocardial infarction     Past Surgical History  Procedure Laterality Date  . Abdominal hysterectomy      part. hyst.  . Tubal ligation    . Cholecystectomy  10/28/2002    lap.  . Debridement tennis elbow    . Elbow surgery      golfer's elbow-bilateral  . Knee debridement      right  . Appendectomy      with explor. lap.  . Stapedectomy      right  . Trigger finger release  08/19/2011    Procedure:  RELEASE TRIGGER FINGER/A-1 PULLEY;  Surgeon: Stacey Lawrence;  Location: [place];  Service: Orthopedics;  Laterality: Right;  right middle finger a-1 pulley release with tenosynovectomy  . Tonsillectomy and adenoidectomy    . Diagnostic laparoscopy    . Dilation and curettage of uterus    . Tympanoplasty    . Colonoscopy    . I&d extremity  02/25/2012    Procedure: IRRIGATION AND DEBRIDEMENT EXTREMITY;  Surgeon: Stacey Kaufman, MD;  Location: [facility];  Service: Orthopedics;  Laterality: Right;  Irrigation and Debridement and Repair Right Thumb Nerve Laceration and Assoiated Structures   . Laparotomy    . Esophagogastroduodenoscopy  09/10/2012    Procedure: ESOPHAGOGASTRODUODENOSCOPY (EGD);  Surgeon: Stacey Houston, MD;  [facility];  Service: Endoscopy;  Laterality: N/A;  730  . Left heart catheterization with coronary angiogram N/A 01/16/2013    Procedure: LEFT HEART CATHETERIZATION WITH CORONARY ANGIOGRAM;  Surgeon: Stacey Artist, MD;  Location: [facility] CATH LAB;  Service: Cardiovascular;  Laterality: N/A;    Social History   Social History  . Marital Status: Married    Spouse Name: N/A  . Number of Children: N/A  . Years of Education: N/A   Occupational History  . Not on file.   Social History Main Topics  . Smoking status: Never Smoker   . Smokeless tobacco: Never Used  . Alcohol Use: 0.0 oz/week    0 Standard drinks or equivalent per week     Comment: rarely  . Drug Use: No  . Sexual Activity: Yes    Birth Control/ Protection: Surgical   Other Topics Concern  . Not on file   Social History Narrative   Lives in [place] with husband.  Grown children.  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     Filed Vitals:   06/19/15 1356  BP: 110/64  Pulse: 73  Height: 5\' 8"  (1.727 m)  Weight: 144 lb 9.6 oz (65.59 kg)  SpO2: 98%    PHYSICAL EXAM General: NAD HEENT: Normal. Neck: No JVD, no thyromegaly. Lungs: Clear to  auscultation bilaterally with normal respiratory effort. CV: Nondisplaced PMI. Regular rate and rhythm, normal S1/S2, no S3/S4, no murmur. No pretibial or periankle edema. No carotid bruit. Normal pedal pulses.  Abdomen: Soft, nontender, no hepatosplenomegaly, no distention.  Neurologic: Alert and oriented x 3.  Psych: Normal affect. Skin: Wearing compression stockings. Musculoskeletal: Normal range of motion, no gross deformities. Extremities: No clubbing or cyanosis.   ECG: Most recent ECG reviewed.      ASSESSMENT AND PLAN: 1. Chest  pain in the context of CAD: Chest pain earlier this week atypical for cardiac etiology. It may very well have been a panic attack given the prior week's emotion stressors. Taking Ranexa 1000 mg bid but wants to try 500 mg bid. She is off of beta blockers due to prior issues with hypotension. Continue ASA and Lipitor 40 mg.   2. Venous varicosities: Saw vascular surgery. Wears waist-high compression stockings 20-30 mmHg when temps are colder (now wearing knee-high) which provides some relief, and offered sclerotherapy for symptoms.   3. Hyperlipidemia: HDL 73, LDL 52 on 10/20/2014. Continue Lipitor 40 mg.  4. Multiple medical problems: Applying for disability.   Dispo: f/u 6 months.    Kate Sable, M.D., F.A.C.C.

## 2015-06-19 NOTE — Patient Instructions (Signed)
Your physician wants you to follow-up in: 6 months You will receive a reminder letter in the mail two months in advance. If you don't receive a letter, please call our office to schedule the follow-up appointment.    DECREASE Ranexa to 500 mg twice a day    Thank you for choosing [facility] !

## 2015-06-26 ENCOUNTER — Other Ambulatory Visit (HOSPITAL_COMMUNITY): Payer: 59

## 2015-08-20 DIAGNOSIS — Z0279 Encounter for issue of other medical certificate: Secondary | ICD-10-CM | POA: Diagnosis not present

## 2015-09-23 MED FILL — CELECOXIB 200 MG CAPSULE: 200 | 90 days supply | Qty: 90 | Fill #0

## 2015-09-24 ENCOUNTER — Telehealth: Payer: Self-pay | Admitting: *Deleted

## 2015-09-24 NOTE — Telephone Encounter (Signed)
Stacey Lawrence calling in requesting prescription be sent to [facility] for knee high compression hose 20-30 mm HG.  Stacey Lawrence saw Dr. Oneida Alar on 02-25-2015 for painful varicose veins with deep venous reflux and he recommended knee high compression hose 20-30 mm HG.  Prescription faxed to [facility] [phone].

## 2015-09-30 MED FILL — NITROGLYCERIN 0.4 MG TAB SL: 0.4 | 90 days supply | Qty: 100 | Fill #1

## 2015-10-01 ENCOUNTER — Telehealth: Payer: Self-pay | Admitting: Cardiovascular Disease

## 2015-10-01 NOTE — Telephone Encounter (Signed)
Ok to change maker of NTG

## 2015-10-01 NOTE — Telephone Encounter (Signed)
Manuela Schwartz the pharmacist from [facility] called and said Nitro Stat tabs are no preferred on our ins any longer due to generic. She's wanting to know if she can substitue to Nitro .[place] can be reached @ [phone]

## 2015-10-02 ENCOUNTER — Telehealth: Payer: Self-pay | Admitting: Cardiovascular Disease

## 2015-10-02 NOTE — Telephone Encounter (Signed)
Stacey Lawrence called to let us know she does not quality to make enough on disability and she needs to be cleared to go back to work starting on February 1st, 2017 for 2 half days (4-6 hours) each week.  Dr. Bronson Ing approved of this via staff message. Cathey, RN will draft a letter for Dr. Raliegh Ip to sign giving this patient clearance to work part-time starting 10/21/15.

## 2015-10-07 DIAGNOSIS — M79641 Pain in right hand: Secondary | ICD-10-CM | POA: Diagnosis not present

## 2015-10-07 DIAGNOSIS — Z9889 Other specified postprocedural states: Secondary | ICD-10-CM | POA: Diagnosis not present

## 2015-10-10 DIAGNOSIS — M5441 Lumbago with sciatica, right side: Secondary | ICD-10-CM | POA: Diagnosis not present

## 2015-10-10 DIAGNOSIS — M5442 Lumbago with sciatica, left side: Secondary | ICD-10-CM | POA: Diagnosis not present

## 2015-10-12 ENCOUNTER — Other Ambulatory Visit: Payer: Self-pay | Admitting: Orthopedic Surgery

## 2015-10-12 DIAGNOSIS — M5441 Lumbago with sciatica, right side: Principal | ICD-10-CM

## 2015-10-12 DIAGNOSIS — G8929 Other chronic pain: Secondary | ICD-10-CM

## 2015-10-12 DIAGNOSIS — M5442 Lumbago with sciatica, left side: Principal | ICD-10-CM

## 2015-10-12 MED FILL — predniSONE 10 MG (21) TBPK: 10 | 6 days supply | Qty: 21 | Fill #0

## 2015-10-12 MED FILL — traMADol HCL 50 MG TABS: 50 | 7 days supply | Qty: 40 | Fill #0

## 2015-10-19 ENCOUNTER — Ambulatory Visit
Admission: RE | Admit: 2015-10-19 | Discharge: 2015-10-19 | Disposition: A | Discharge status: Home / Self Care | Payer: 59 | Source: Ambulatory Visit | Attending: Orthopedic Surgery | Admitting: Orthopedic Surgery

## 2015-10-19 DIAGNOSIS — M4806 Spinal stenosis, lumbar region: Secondary | ICD-10-CM | POA: Diagnosis not present

## 2015-10-19 DIAGNOSIS — M5441 Lumbago with sciatica, right side: Principal | ICD-10-CM

## 2015-10-19 DIAGNOSIS — G8929 Other chronic pain: Secondary | ICD-10-CM

## 2015-10-19 DIAGNOSIS — M5442 Lumbago with sciatica, left side: Principal | ICD-10-CM

## 2015-10-19 MED ORDER — IOHEXOL 180 MG/ML  SOLN
15.0000 mL | Freq: Once | INTRAMUSCULAR | Status: AC | PRN
  Administered 2015-10-19: 15 mL via EPIDURAL

## 2015-10-19 MED ORDER — DIAZEPAM 5 MG PO TABS
10.0000 mg | ORAL_TABLET | Freq: Once | ORAL | Status: AC
  Administered 2015-10-19: 10 mg via ORAL

## 2015-10-19 MED FILL — PROMETHAZINE 25 MG TABLET: 25 | 3 days supply | Qty: 10 | Fill #0

## 2015-10-19 NOTE — Progress Notes (Signed)
Pt states she took the last dose of Tramadol on Friday.   Discharge instructions explained to pt.

## 2015-10-19 NOTE — Discharge Instructions (Signed)
Myelogram Discharge Instructions  1. Go home and rest quietly for the next 24 hours.  It is important to lie flat for the next 24 hours.  Get up only to go to the restroom.  You may lie in the bed or on a couch on your back, your stomach, your left side or your right side.  You may have one pillow under your head.  You may have pillows between your knees while you are on your side or under your knees while you are on your back.  2. DO NOT drive today.  Recline the seat as far back as it will go, while still wearing your seat belt, on the way home.  3. You may get up to go to the bathroom as needed.  You may sit up for 10 minutes to eat.  You may resume your normal diet and medications unless otherwise indicated.  Drink lots of extra fluids today and tomorrow.  4. The incidence of headache, nausea, or vomiting is about 5% (one in 20 patients).  If you develop a headache, lie flat and drink plenty of fluids until the headache goes away.  Caffeinated beverages may be helpful.  If you develop severe nausea and vomiting or a headache that does not go away with flat bed rest, call [phone].  5. You may resume normal activities after your 24 hours of bed rest is over; however, do not exert yourself strongly or do any heavy lifting tomorrow. If when you get up you have a headache when standing, go back to bed and force fluids for another 24 hours.  6. Call your physician for a follow-up appointment.  The results of your myelogram will be sent directly to your physician by the following day.  7. If you have any questions or if complications develop after you arrive home, please call [phone].  Discharge instructions have been explained to the patient.  The patient, or the person responsible for the patient, fully understands these instructions.       May resume Tramadol on Jan. 31, 2017, after 1:00 pm

## 2015-10-26 DIAGNOSIS — M5442 Lumbago with sciatica, left side: Secondary | ICD-10-CM | POA: Diagnosis not present

## 2015-10-26 DIAGNOSIS — M7071 Other bursitis of hip, right hip: Secondary | ICD-10-CM | POA: Diagnosis not present

## 2015-10-26 DIAGNOSIS — M5441 Lumbago with sciatica, right side: Secondary | ICD-10-CM | POA: Diagnosis not present

## 2015-10-26 MED FILL — MELOXICAM 15 MG TABLET: 15 | 30 days supply | Qty: 30 | Fill #0

## 2015-10-26 MED FILL — ALPRAZolam 0.25 MG TABS: 0.25 | 30 days supply | Qty: 30 | Fill #0

## 2015-10-29 ENCOUNTER — Encounter: Payer: Self-pay | Admitting: Orthopaedic Surgery

## 2015-10-29 ENCOUNTER — Ambulatory Visit (INDEPENDENT_AMBULATORY_CARE_PROVIDER_SITE_OTHER): Payer: 59 | Admitting: Orthopaedic Surgery

## 2015-10-29 ENCOUNTER — Ambulatory Visit: Payer: 59 | Admitting: Orthopaedic Surgery

## 2015-10-29 VITALS — BP 143/75 | HR 75 | Temp 97.7°F | Ht 68.0 in | Wt 150.2 lb

## 2015-10-29 DIAGNOSIS — M7061 Trochanteric bursitis, right hip: Secondary | ICD-10-CM | POA: Diagnosis not present

## 2015-10-29 DIAGNOSIS — M25552 Pain in left hip: Secondary | ICD-10-CM

## 2015-10-29 DIAGNOSIS — M7062 Trochanteric bursitis, left hip: Secondary | ICD-10-CM | POA: Diagnosis not present

## 2015-10-29 DIAGNOSIS — M25551 Pain in right hip: Secondary | ICD-10-CM

## 2015-10-29 NOTE — Patient Instructions (Signed)
We will send order for tens unit to the therapy dept  No lifting greater than 35 lbs, no prolonged standing, no twisting or turning, no prolonged sitting, no squatting

## 2015-10-31 DIAGNOSIS — M7061 Trochanteric bursitis, right hip: Secondary | ICD-10-CM | POA: Insufficient documentation

## 2015-10-31 DIAGNOSIS — M7062 Trochanteric bursitis, left hip: Secondary | ICD-10-CM

## 2015-10-31 NOTE — Progress Notes (Signed)
Patient Stacey Lawrence, female DOB:08/23/1951, 65 y.o. [id number]  Chief Complaint  Patient presents with  . Back Pain  . Hip Pain    HPI  Stacey Lawrence is a 65 y.o. female who presents with multiple problems of bilateral hip pain from chronic unrelenting bursitis of the hips trochanteric area and back pain.  HPI  She has significant trochanteric bursitis of both hips, about equal.  She has been evaluated multiple times and has had multiple treatments including ice, heat, injections, physical therapy, rest with no relief.  She is followed by Dr. Louanne Skye.  I have reviewed his reports.  He has told her that he wants to try ionophoresis in physical therapy and if that does not work, he recommends excision of the bursas.  She wants another opinion.  She has read that the surgery may or may not be successful.  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  She has also developed back pain in the last three to four years.  MRI shows degeneration of the discs present with no HNP or nerve root involvement.  She is unable to move patients in the endoscopy area now secondary to the back pain.  She has no paresthesias, no bowel or bladder problems and no weakness.  Her pain is sharp, dull, throbbing, burning, stabbing and concentrated in the lateral hip areas.  Her pan is constant and more at night and with activity or bending or twisting.  She says the pain is 8 of 10 at times but more at a 5 to 6 of 10 most of the time.  Again she has had CT, MRI and PT.  Nothing seems to make her pain of her back or hips any better.  She is very frustrated.  Body mass index is 22.84 kg/(m^2).  Review of Systems  Patient does not have Diabetes Mellitus. Patient does not have hypertension. Patient has COPD or shortness of breath. Patient does not have BMI > 35. Patient does not have current smoking history.  Review of Systems   Constitutional: Positive for fatigue.  HENT: Positive for hearing loss (right ear) and tinnitus.   Respiratory: Positive for shortness of breath.   Gastrointestinal: Positive for nausea, abdominal pain and constipation.  Endocrine: Positive for cold intolerance and heat intolerance.  Musculoskeletal: Positive for myalgias, back pain, joint swelling and neck pain.  Neurological: Positive for numbness.  Psychiatric/Behavioral: The patient is nervous/anxious.   She has had six myocardial infarctions and is seeing cardiologist.  Past Medical History  Diagnosis Date  . PONV (postoperative nausea and vomiting)   . Fibromyalgia   . Migraine headache     a. migraines and cluster;last migraine 3 mon ago  . Joint pain   . Joint swelling   . Scoliosis   . GERD (gastroesophageal reflux disease)     hasn't started taking any meds  . IBS (irritable bowel syndrome)     constipation  . Hx of colonic polyps   . Kidney stones 03/2010  . History of blood transfusion     64yrs ago  . Polycythemia   . CAD (coronary artery disease)     a. 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 05/2013 Lexi CL: EF 77%, old small inferolat scar, no ischemia.  . Myocardial infarction [facility])     Past Surgical History  Procedure Laterality Date  . Abdominal hysterectomy      part. hyst.  . Tubal ligation    . Cholecystectomy  10/28/2002    lap.  . Debridement tennis elbow    . Elbow surgery      golfer's elbow-bilateral  . Knee debridement      right  . Appendectomy      with explor. lap.  . Stapedectomy      right  . Trigger finger release  08/19/2011    Procedure: RELEASE TRIGGER FINGER/A-1 PULLEY;  Surgeon: Willa Frater III;  Location: [place];   Service: Orthopedics;  Laterality: Right;  right middle finger a-1 pulley release with tenosynovectomy  . Tonsillectomy and adenoidectomy    . Diagnostic laparoscopy    . Dilation and curettage of uterus    . Tympanoplasty    . Colonoscopy    . I&d extremity  02/25/2012    Procedure: IRRIGATION AND DEBRIDEMENT EXTREMITY;  Surgeon: Roseanne Kaufman, MD;  Location: [place];  Service: Orthopedics;  Laterality: Right;  Irrigation and Debridement and Repair Right Thumb Nerve Laceration and Assoiated Structures   . Laparotomy    . Esophagogastroduodenoscopy  09/10/2012    Procedure: ESOPHAGOGASTRODUODENOSCOPY (EGD);  Surgeon: Rogene Houston, MD;  [facility];  Service: Endoscopy;  Laterality: N/A;  730  . Left heart catheterization with coronary angiogram N/A 01/16/2013    Procedure: LEFT HEART CATHETERIZATION WITH CORONARY ANGIOGRAM;  Surgeon: Jolaine Artist, MD;  Location: [facility] CATH LAB;  Service: Cardiovascular;  Laterality: N/A;    Family History  Problem Relation Age of Onset  . Cancer Mother     Deceased with lung CA  . Heart failure Mother   . Heart disease Mother   . COPD Mother   . Varicose Veins Mother   . Cancer Father     Deceased with lung CA  . Heart disease Father   . Varicose Veins Father     Social History Social History  Substance Use Topics  . Smoking status: Never Smoker   . Smokeless tobacco: Never Used  . Alcohol Use: 0.0 oz/week    0 Standard drinks or equivalent per week     Comment: rarely    Allergies  Allergen Reactions  . Dilaudid [Hydromorphone Hcl] Other (See Comments)    Resp. arrest  . Codeine Nausea And Vomiting and Rash  . Morphine And Related Nausea And Vomiting    Current Outpatient Prescriptions  Medication Sig Dispense Refill  . ALPRAZolam (XANAX) 0.25 MG tablet Take 0.25 mg by mouth at bedtime as needed for anxiety. Takes 1/2 tab qhs prn sleep    . Ascorbic Acid (VITAMIN C PO) Take 2 tablets by mouth daily.    Marland Kitchen aspirin EC  81 MG EC tablet Take 1 tablet (81 mg total) by mouth daily.    Marland Kitchen atorvastatin (LIPITOR) 40 MG tablet Take 1 tablet (40 mg total) by mouth daily. 90 tablet 4  . Calcium Citrate-Vitamin D (CALCITRATE/VITAMIN D PO) Take 1 tablet by mouth daily.    . indomethacin (INDOCIN) 25 MG capsule   2  . meloxicam (MOBIC) 15 MG tablet Take 15 mg by mouth daily.    Marland Kitchen NITROSTAT 0.4 MG SL tablet PLACE 1 TABLET UNDER THE TONGUE EVERY  5 MINUTES AS NEEDED FOR CHEST PAIN 100 tablet 1  . pantoprazole (PROTONIX) 40 MG tablet TAKE 1 TABLET BY MOUTH ONCE DAILY 30 tablet 5  . ranolazine (RANEXA) 500 MG 12 hr tablet Take 1 tablet (500 mg total) by mouth 2 (two) times daily. 180 tablet 3  . traMADol (ULTRAM) 50 MG tablet Take by mouth every 6 (six) hours as needed.    . celecoxib (CELEBREX) 200 MG capsule Reported on 10/29/2015  4  . ibuprofen (ADVIL,MOTRIN) 200 MG tablet Take 400 mg by mouth every 6 (six) hours as needed for pain. Reported on 10/29/2015     No current facility-administered medications for this visit.     Physical Exam  Blood pressure 143/75, pulse 75, temperature 97.7 F (36.5 C), height 5\' 8"  (1.727 m), weight 150 lb 3.2 oz (68.13 kg).  Constitutional: overall normal hygiene, normal nutrition, well developed, normal grooming, normal body habitus. Assistive device:none  Musculoskeletal: gait and station Limp none, muscle tone and strength are normal, no tremors or atrophy is present.  .  Neurological: coordination overall normal.  Deep tendon reflex/nerve stretch intact.  Sensation normal.  Cranial nerves II-XII intact.   Skin:normal overall no scars, lesions, ulcers or rash es. No psoriasis.  Psychiatric: Alert and oriented x 3.  Recent memory intact, remote memory unclear.  Normal mood and affect. Well groomed.  Good eye contact.  Cardiovascular: overall no swelling, no varicosities, no edema bilaterally, normal temperatures of the legs and arms, no clubbing, cyanosis and good capillary  refill.  Lymphatic: palpation is normal.   Extremities:Both hips are very tender over the trochanteric areas.  She has no redness.  She has no wound or drainage.  Her back is diffusely tender in the lumbar area with no spasm or deformity. Inspection normal of the hips and back Strength and tone normal Range of motion full of her hips bilaterally.  Her back has flexion of only 30 and lateral bend of only 5 in each direction and extension of only 5.  Reflexes are normal.  Additional services performed: Review of MRI and CT; Review of notes from other physicians.  I have told her that she has indeed tried most everything for her hip pains.  I told her to also add TENS unit as a trial to see if it helps her hips and her back.  It is worth a try.  She may be successful then that is great.  If not, then surgery or no surgery is her options.  I will see how she does with the TENS and PT and see her back int two weeks.  PLAN Call if any problems.  Precautions discussed.  Continue current medications.   Return to clinic 2 wks

## 2015-11-06 DIAGNOSIS — M2550 Pain in unspecified joint: Secondary | ICD-10-CM | POA: Diagnosis not present

## 2015-11-06 DIAGNOSIS — I2510 Atherosclerotic heart disease of native coronary artery without angina pectoris: Secondary | ICD-10-CM | POA: Diagnosis not present

## 2015-11-06 DIAGNOSIS — M25559 Pain in unspecified hip: Secondary | ICD-10-CM | POA: Diagnosis not present

## 2015-11-06 DIAGNOSIS — G589 Mononeuropathy, unspecified: Secondary | ICD-10-CM | POA: Diagnosis not present

## 2015-11-06 DIAGNOSIS — F5101 Primary insomnia: Secondary | ICD-10-CM | POA: Diagnosis not present

## 2015-11-06 DIAGNOSIS — G4700 Insomnia, unspecified: Secondary | ICD-10-CM | POA: Diagnosis not present

## 2015-11-06 DIAGNOSIS — G43001 Migraine without aura, not intractable, with status migrainosus: Secondary | ICD-10-CM | POA: Diagnosis not present

## 2015-11-06 MED FILL — CARISOPRODOL 350 MG TABLET: 350 | 20 days supply | Qty: 60 | Fill #0

## 2015-11-06 MED FILL — ALPRAZolam 0.5 MG TABS: 0.5 | 30 days supply | Qty: 60 | Fill #0

## 2015-11-09 ENCOUNTER — Ambulatory Visit (HOSPITAL_COMMUNITY): Payer: 59 | Attending: Orthopaedic Surgery | Admitting: Physical Therapy

## 2015-11-09 DIAGNOSIS — R29898 Other symptoms and signs involving the musculoskeletal system: Secondary | ICD-10-CM | POA: Diagnosis not present

## 2015-11-09 DIAGNOSIS — M25552 Pain in left hip: Secondary | ICD-10-CM | POA: Insufficient documentation

## 2015-11-09 DIAGNOSIS — M25551 Pain in right hip: Secondary | ICD-10-CM | POA: Diagnosis not present

## 2015-11-09 DIAGNOSIS — M25659 Stiffness of unspecified hip, not elsewhere classified: Secondary | ICD-10-CM | POA: Diagnosis not present

## 2015-11-09 DIAGNOSIS — R6889 Other general symptoms and signs: Secondary | ICD-10-CM | POA: Insufficient documentation

## 2015-11-09 NOTE — Patient Instructions (Signed)
Hamstring Stretch: Active    Support behind right knee. Starting with knee bent, attempt to straighten knee until a comfortable stretch is felt in back of thigh. Hold __30__ seconds. Repeat ___3_ times per set. Do __1__ sets per session. Do _2___ sessions per day.   .  http://orth.exer.us/94   Copyright  VHI. All rights reserved.  On Elbows (Prone)    Rise up on elbows as high as possible, keeping hips on floor. Hold __60__ seconds. Repeat 1____ times per set. Do __1__ sets per session. Do _2___ sessions per day.  http://orth.exer.us/92   Copyright  VHI. All rights reserved.  Isometric Abdominal    Lying on back with knees bent, tighten stomach by pressing elbows down. Hold __3__ seconds. Repeat __10__ times per set. Do __1__ sets per session. Do ____ sessions per day. 3 http://orth.exer.us/1086   Copyright  VHI. All rights reserved.  Bridging    Slowly raise buttocks from floor, keeping stomach tight. Repeat _10___ times per set. Do __1__ sets per session. Do 2____ sessions per day.  http://orth.exer.us/1096   Copyright  VHI. All rights reserved.  Backward Bend (Standing)    Arch backward to make hollow of back deeper. Hold _2___ seconds. Repeat ___5_ times per set. Do _1___ sets per session. Do __3__ sessions per day.  http://orth.exer.us/178   Copyright  VHI. All rights reserved.

## 2015-11-09 NOTE — Therapy (Signed)
[place], [name], [address] Phone: [phone]   Fax:  [phone]  Physical Therapy Evaluation  Patient Details  Name: Stacey Lawrence MRN: [id number] Date of Birth: [birth year] Referring Provider: Sanjuana Kava   Encounter Date: 11/09/2015      PT End of Session - 11/09/15 0940    Visit Number 1   Number of Visits 16   Date for PT Re-Evaluation 12/09/15   Authorization Type UMR   Authorization - Visit Number 1   Authorization - Number of Visits 10   PT Start Time 0830   PT Stop Time 0930   PT Time Calculation (min) 60 min   Activity Tolerance Patient tolerated treatment well   Behavior During Therapy [facility] for tasks assessed/performed      Past Medical History  Diagnosis Date  . PONV (postoperative nausea and vomiting)   . Fibromyalgia   . Migraine headache     a. migraines and cluster;last migraine 3 mon ago  . Joint pain   . Joint swelling   . Scoliosis   . GERD (gastroesophageal reflux disease)     hasn't started taking any meds  . IBS (irritable bowel syndrome)     constipation  . Hx of colonic polyps   . Kidney stones 03/2010  . History of blood transfusion     104yrs ago  . Polycythemia   . CAD (coronary artery disease)     a. 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 05/2013 Lexi CL: EF 77%, old small inferolat scar, no ischemia.  . Myocardial infarction [facility])     Past Surgical History  Procedure Laterality Date  . Abdominal hysterectomy      part. hyst.  . Tubal ligation    . Cholecystectomy  10/28/2002    lap.  . Debridement tennis elbow    . Elbow surgery      golfer's elbow-bilateral  . Knee debridement      right  . Appendectomy       with explor. lap.  . Stapedectomy      right  . Trigger finger release  08/19/2011    Procedure: RELEASE TRIGGER FINGER/A-1 PULLEY;  Surgeon: Willa Frater III;  Location: [place];  Service: Orthopedics;  Laterality: Right;  right middle finger a-1 pulley release with tenosynovectomy  . Tonsillectomy and adenoidectomy    . Diagnostic laparoscopy    . Dilation and curettage of uterus    . Tympanoplasty    . Colonoscopy    . I&d extremity  02/25/2012    Procedure: IRRIGATION AND DEBRIDEMENT EXTREMITY;  Surgeon: Roseanne Kaufman, MD;  Location: [place];  Service: Orthopedics;  Laterality: Right;  Irrigation and Debridement and Repair Right Thumb Nerve Laceration and Assoiated Structures   . Laparotomy    . Esophagogastroduodenoscopy  09/10/2012    Procedure: ESOPHAGOGASTRODUODENOSCOPY (EGD);  Surgeon: Rogene Houston, MD;  [facility];  Service: Endoscopy;  Laterality: N/A;  730  . Left heart catheterization with coronary angiogram N/A 01/16/2013    Procedure: LEFT HEART CATHETERIZATION WITH CORONARY ANGIOGRAM;  Surgeon: Jolaine Artist, MD;  Location: [facility] CATH LAB;  Service: Cardiovascular;  Laterality:  N/A;    There were no vitals filed for this visit.  Visit Diagnosis:  Leg weakness, bilateral - Plan: PT plan of care cert/re-cert  Hip pain, bilateral - Plan: PT plan of care cert/re-cert  Stiffness of hip joint, unspecified laterality - Plan: PT plan of care cert/re-cert  Decreased activity tolerance - Plan: PT plan of care cert/re-cert      Subjective Assessment - 11/09/15 1005    Subjective [name] states that she has been having hip pain for over seven  years now.  She states that her left hip initially was injured seven years ago when she fell down her steps.  She has had seven  injections in her left hip and her physician does not want her to have any more injections.  Her right hip began bothering her approximately three years ago and has had five  injections in her right hip.  She states that due to her pain she can only sleep for one hour at a time.  She is now being referred to physical therapy to try and decrease her hip pain.  She states she has applied for disability but has been denied.  She has a 35# llifting limitation due to previous MI's   She has been referred to physical therapy to attempt to decrease her pain.   Pertinent History five MI's; HNP, varicose insufficiency,    How long can you sit comfortably? 20-30 minutes then she needs to move due to back and hip pain    How long can you stand comfortably? Stands for 15-20 minutes due to back and hip pain.    How long can you walk comfortably? Pt is able to walk for about 10-15 mintues before she needs to sit down.    Patient Stated Goals be able to sleep longer, decrease pain,    Currently in Pain? Yes   Pain Score 8    Pain Location Hip   Pain Orientation Right;Left   Pain Descriptors / Indicators Aching;Burning;Throbbing   Pain Onset More than a month ago   Pain Frequency Intermittent   Aggravating Factors  walking , standing   Pain Relieving Factors lying down, ice packs.    Effect of Pain on Daily Activities increases             [facility] PT Assessment - 11/09/15 0001    Assessment   Medical Diagnosis Bilateral  hip bursitis    Referring Provider Sanjuana Kava    Onset Date/Surgical Date 11/08/08  with acute exacerbation 07/21/2015   Hand Dominance Right   Next MD Visit 11/14/2015   Prior Therapy none    Precautions   Precautions Other (comment)   Precaution Comments --  35# lifting limitation due to heart attacks   Restrictions   Weight Bearing Restrictions No   Balance Screen   Has the patient fallen in the past 6 months No   Has the patient had a decrease in activity level because of a fear of falling?  No   Is the patient reluctant to leave their home because of a fear of falling?  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    Leisure sewing, woodwork, walking    Cognition   Overall Cognitive Status Within Functional Limits for tasks assessed  Observation/Other Assessments   Focus on Therapeutic Outcomes (FOTO)  35   ROM / Strength   AROM / PROM / Strength AROM;Strength   AROM   AROM Assessment Site Hip;Lumbar   Right/Left Hip Right;Left   Lumbar Flexion 45    Lumbar Extension 12   Strength   Strength Assessment Site Hip;Knee;Ankle   Right/Left Hip Right;Left   Right Hip Flexion 3+/5   Right Hip Extension 3/5   Right Hip ABduction 3+/5   Left Hip Flexion 4-/5   Left Hip Extension 3+/5   Right/Left Knee Right;Left   Right Knee Flexion 3/5   Right Knee Extension 3/5   Left Knee Flexion 3+/5   Left Knee Extension 5/5   Right/Left Ankle Right;Left   Right Ankle Dorsiflexion 3/5   Left Ankle Dorsiflexion 4-/5   Flexibility   Soft Tissue Assessment /Muscle Length yes   Hamstrings moderate restriction    Piriformis Lt 48; Rt  40    Palpation   Palpation comment tight paraspinal mm bilateral    6 Minute Walk- Baseline   6 Minute Walk- Baseline yes  900 ft in 6 minutes                    [facility] Adult PT Treatment/Exercise - 11/09/15 0001    Exercises   Exercises Lumbar;Knee/Hip   Lumbar Exercises: Stretches   Active Hamstring Stretch 3 reps;30 seconds   Standing Extension 5 reps   Prone on Elbows Stretch 2 reps;60 seconds                PT Education - 11/09/15 0938    Education provided Yes   Education Details HEP    Person(s) Educated Patient   Methods Explanation;Tactile cues;Handout   Comprehension Verbalized understanding          PT Short Term Goals - 11/09/15 0951    PT SHORT TERM GOAL #1   Title Pt will improve ROM to within functional limits to be able to pick items off the floor independently.    Time 3    Period Weeks   Status New   PT SHORT TERM GOAL #2   Title Pt will improve LE strenth to 4/5 to allow pt to ambulate 1,000 feet with equal step length in six minutes to feel confident going walking in her yard.   Time 3   Period Weeks   Status New   PT SHORT TERM GOAL #3   Title Pt to be knowledgable about engaging core musculature while completing household activities to allow pt to be able to mop and vacuum .   Time 3   Period Weeks   PT SHORT TERM GOAL #4   Title Pt pain to decrease to 6/10 to allow pt to be able to sleep for two hours at a time.    Time 3   Period Weeks           PT Long Term Goals - 11/09/15 1012    PT LONG TERM GOAL #1   Title Pt strength to be at least 4+/5 to allow pt to walk on uneven surfaces for 20 minutes at a time    Time 8   Period Weeks   Status New   PT LONG TERM GOAL #2   Title PT to be able to demonstrate good body mechanics with lifting to prevent increase stress on back and hips while lifting.    Period Weeks   Status New   PT  LONG TERM GOAL #3   Title Pt to be able to verbalize ways to decrease her pain besides medication to include but not limited to TENS, ice, stretching, postioning to allow minimal medication intake.    Time 8   Period Weeks   Status New   PT LONG TERM GOAL #4   Title Pt to be able to ambulate 1200 ft in six minutes to demonstrate improved activity tolerance    Time 8   Period Weeks   Status New   PT LONG TERM GOAL #5   Title Patient pain to be no greater than 4/10 to allow pt to be able to sleep three hours straight.    Time 8   Period Weeks   Status New               Plan - 11/09/15 0941    Clinical Impression Statement [name] is a 65 yo female who has chronic bilateral hip pain.  Her physician states that she has had too many injections and therefore he has referred her to physical therapy.  Examination demonstrates decreased ROM, decreased strength, decreased activity tolerance and increased  pain.  She will benefit from skilled physical therapy to improve her ROM to allow her to complete household chores, improve her strength to allow her to walk for prolong periods for community ambulation, decrease her pain to allow her to sleep several hours at a time, and increase her functional activity tolerance to allow her to complete her hobbies.     Pt will benefit from skilled therapeutic intervention in order to improve on the following deficits Decreased activity tolerance;Decreased mobility;Decreased strength;Difficulty walking;Impaired perceived functional ability;Impaired flexibility;Pain   Rehab Potential Fair   PT Frequency 2x / week   PT Duration 8 weeks   PT Treatment/Interventions Patient/family education;Gait training;Functional mobility training;Therapeutic activities;Therapeutic exercise;Balance training;Ultrasound;Moist Heat;Electrical Stimulation;Cryotherapy;Iontophoresis 4mg /ml Dexamethasone   PT Next Visit Plan Begin piriformis stretching, Iliotibial band stretching, Single leg stance, education on bed mobility, and pulsed ultrasound to bilateral hips.  Progress to advance stabilization, 3-D hip excursion, and proper lifting techniques.    Consulted and Agree with Plan of Care Patient         Problem List Patient Active Problem List   Diagnosis Date Noted  . Trochanteric bursitis of both hips 10/31/2015  . Chest pain at rest 06/17/2015  . Inferior myocardial infarction ([place]) 06/17/2015  . Sprain of interphalangeal joint of left little finger 02/05/2014  . Pain in joint, hand 02/05/2014  . Midsternal chest pain 06/04/2013  . CAD (coronary artery disease)   . Fibromyalgia   . GERD (gastroesophageal reflux disease)   . Migraine headache   . Acute non-STEMI < 8 weeks prior, subsequent admission after initial 01/16/2013  . Radial nerve laceration 03/27/2012  . Lack of coordination 03/27/2012  . Muscle weakness (generalized) 03/27/2012   Rayetta Humphrey, PT  CLT [phone] 11/09/2015, 10:12 AM  [place] [address] [place], [name], [address] Phone: [phone]   Fax:  [phone]  Name: AKYIA DAULTON MRN: [id number] Date of Birth: [birth year]

## 2015-11-11 ENCOUNTER — Encounter: Payer: Self-pay | Admitting: Orthopaedic Surgery

## 2015-11-12 ENCOUNTER — Ambulatory Visit (INDEPENDENT_AMBULATORY_CARE_PROVIDER_SITE_OTHER): Payer: 59 | Admitting: Orthopaedic Surgery

## 2015-11-12 ENCOUNTER — Encounter: Payer: Self-pay | Admitting: Orthopaedic Surgery

## 2015-11-12 VITALS — BP 125/81 | HR 69 | Temp 98.1°F | Resp 16 | Ht 68.0 in | Wt 150.0 lb

## 2015-11-12 DIAGNOSIS — M7061 Trochanteric bursitis, right hip: Secondary | ICD-10-CM

## 2015-11-12 DIAGNOSIS — M7062 Trochanteric bursitis, left hip: Secondary | ICD-10-CM | POA: Diagnosis not present

## 2015-11-12 NOTE — Progress Notes (Signed)
Patient [id number] Stacey Lawrence, female DOB:12/24/1950, 65 y.o. [id number]  Chief Complaint  Patient presents with  . Follow-up    follow up bilateral hip and back pain    HPI  Stacey Lawrence is a 65 y.o. female who has history of pain and tenderness of both hips from unrelenting pain in the trohanteric areas.  She has been to physical therapy and has has slight relief but not long lasting in her back pain and her hip pains.  She did get a TENS unit and that has helped some. She has limited it to only three times a day for thirty minutes.  I told her she could use it more and as long as she wanted.  It is helping some.  As far as her hip pain, she has tried most everything and has not had much relief.  She may need to consider the surgery for bursa removal. She will think about this. HPI  Body mass index is 22.81 kg/(m^2).   Review of Systems  Constitutional: Positive for fatigue.  HENT: Positive for hearing loss (right ear) and tinnitus.   Respiratory: Positive for shortness of breath.   Gastrointestinal: Positive for nausea, abdominal pain and constipation.  Endocrine: Positive for cold intolerance and heat intolerance.  Musculoskeletal: Positive for myalgias, back pain, joint swelling and neck pain.  Neurological: Positive for numbness.  Psychiatric/Behavioral: The patient is nervous/anxious.     Past Medical History  Diagnosis Date  . PONV (postoperative nausea and vomiting)   . Fibromyalgia   . Migraine headache     a. migraines and cluster;last migraine 3 mon ago  . Joint pain   . Joint swelling   . Scoliosis   . GERD (gastroesophageal reflux disease)     hasn't started taking any meds  . IBS (irritable bowel syndrome)     constipation  . Hx of colonic polyps   . Kidney stones 03/2010  . History of blood transfusion     34yrs ago  . Polycythemia   . CAD (coronary artery disease)     a. 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 05/2013 Lexi CL: EF 77%, old small inferolat scar, no ischemia.  . Myocardial infarction [facility])     Past Surgical History  Procedure Laterality Date  . Abdominal hysterectomy      part. hyst.  . Tubal ligation    . Cholecystectomy  10/28/2002    lap.  . Debridement tennis elbow    . Elbow surgery      golfer's elbow-bilateral  . Knee debridement      right  . Appendectomy      with explor. lap.  . Stapedectomy      right  . Trigger finger release  08/19/2011    Procedure: RELEASE TRIGGER FINGER/A-1 PULLEY;  Surgeon: Willa Frater III;  Location: [place];  Service: Orthopedics;  Laterality: Right;  right middle finger a-1 pulley release with tenosynovectomy  . Tonsillectomy and adenoidectomy    . Diagnostic laparoscopy    . Dilation and curettage of uterus    . Tympanoplasty    . Colonoscopy    . I&d extremity  02/25/2012    Procedure: IRRIGATION  AND DEBRIDEMENT EXTREMITY;  Surgeon: Roseanne Kaufman, MD;  Location: [place];  Service: Orthopedics;  Laterality: Right;  Irrigation and Debridement and Repair Right Thumb Nerve Laceration and Assoiated Structures   . Laparotomy    . Esophagogastroduodenoscopy  09/10/2012    Procedure: ESOPHAGOGASTRODUODENOSCOPY (EGD);  Surgeon: Rogene Houston, MD;  [facility];  Service: Endoscopy;  Laterality: N/A;  730  . Left heart catheterization with coronary angiogram N/A 01/16/2013    Procedure: LEFT HEART CATHETERIZATION WITH CORONARY ANGIOGRAM;  Surgeon: Jolaine Artist, MD;  Location: [facility] CATH LAB;  Service: Cardiovascular;  Laterality: N/A;    Family History  Problem Relation Age of Onset  . Cancer Mother     Deceased with lung CA  . Heart failure Mother   . Heart disease Mother   . COPD Mother   .  Varicose Veins Mother   . Cancer Father     Deceased with lung CA  . Heart disease Father   . Varicose Veins Father     Social History Social History  Substance Use Topics  . Smoking status: Never Smoker   . Smokeless tobacco: Never Used  . Alcohol Use: 0.0 oz/week    0 Standard drinks or equivalent per week     Comment: rarely    Allergies  Allergen Reactions  . Dilaudid [Hydromorphone Hcl] Other (See Comments)    Resp. arrest  . Codeine Nausea And Vomiting and Rash  . Morphine And Related Nausea And Vomiting    Current Outpatient Prescriptions  Medication Sig Dispense Refill  . ALPRAZolam (XANAX) 0.25 MG tablet Take 0.25 mg by mouth at bedtime as needed for anxiety. Takes 1/2 tab qhs prn sleep    . Ascorbic Acid (VITAMIN C PO) Take 2 tablets by mouth daily.    Marland Kitchen aspirin EC 81 MG EC tablet Take 1 tablet (81 mg total) by mouth daily.    Marland Kitchen atorvastatin (LIPITOR) 40 MG tablet Take 1 tablet (40 mg total) by mouth daily. 90 tablet 4  . Calcium Citrate-Vitamin D (CALCITRATE/VITAMIN D PO) Take 1 tablet by mouth daily.    . celecoxib (CELEBREX) 200 MG capsule Reported on 10/29/2015  4  . ibuprofen (ADVIL,MOTRIN) 200 MG tablet Take 400 mg by mouth every 6 (six) hours as needed for pain. Reported on 10/29/2015    . indomethacin (INDOCIN) 25 MG capsule   2  . meloxicam (MOBIC) 15 MG tablet Take 15 mg by mouth daily.    Marland Kitchen NITROSTAT 0.4 MG SL tablet PLACE 1 TABLET UNDER THE TONGUE EVERY 5 MINUTES AS NEEDED FOR CHEST PAIN 100 tablet 1  . pantoprazole (PROTONIX) 40 MG tablet TAKE 1 TABLET BY MOUTH ONCE DAILY 30 tablet 5  . ranolazine (RANEXA) 500 MG 12 hr tablet Take 1 tablet (500 mg total) by mouth 2 (two) times daily. 180 tablet 3  . traMADol (ULTRAM) 50 MG tablet Take by mouth every 6 (six) hours as needed.     No current facility-administered medications for this visit.     Physical Exam  Blood pressure 125/81, pulse 69, temperature 98.1 F (36.7 C), resp. rate 16, height 5\' 8"   (1.727 m), weight 150 lb (68.04 kg).  Constitutional: overall normal hygiene, normal nutrition, well developed, normal grooming, normal body habitus. Assistive device:she is using a TENS unit for her pain  Musculoskeletal: gait and station Limp none, muscle tone and strength are normal, no tremors or atrophy is present.  .  Neurological: coordination overall normal.  Deep  tendon reflex/nerve stretch intact.  Sensation normal.  Cranial nerves II-XII intact.   Skin:   normal overall no scars, lesions, ulcers or rashes. No psoriasis.  Psychiatric: Alert and oriented x 3.  Recent memory intact, remote memory unclear.  Normal mood and affect. Well groomed.  Good eye contact.  Cardiovascular: overall no swelling, no varicosities, no edema bilaterally, normal temperatures of the legs and arms, no clubbing, cyanosis and good capillary refill.  Lymphatic: palpation is normal.   Extremities:Patient has tenderness of both hips, trochanteric areas, very tender.  TENS unit is in place. Inspection per above Strength and tone normal Range of motion full of hips  The patient has been educated about the nature of the problem(s) and counseled on treatment options.  The patient appeared to understand what I have discussed and is in agreement with it.  PLAN Call if any problems.  Precautions discussed.  Continue current medications. Continue TENS and therapy as needed.  Return to clinic PRN

## 2015-11-12 NOTE — Patient Instructions (Signed)
Continue tens unit and therapy

## 2015-11-13 ENCOUNTER — Encounter: Payer: Self-pay | Admitting: Orthopedic Surgery

## 2015-11-13 ENCOUNTER — Ambulatory Visit (HOSPITAL_COMMUNITY): Payer: 59 | Admitting: Physical Therapy

## 2015-11-13 DIAGNOSIS — R6889 Other general symptoms and signs: Secondary | ICD-10-CM

## 2015-11-13 DIAGNOSIS — M25551 Pain in right hip: Secondary | ICD-10-CM

## 2015-11-13 DIAGNOSIS — M25552 Pain in left hip: Secondary | ICD-10-CM | POA: Diagnosis not present

## 2015-11-13 DIAGNOSIS — R29898 Other symptoms and signs involving the musculoskeletal system: Secondary | ICD-10-CM | POA: Diagnosis not present

## 2015-11-13 DIAGNOSIS — M25659 Stiffness of unspecified hip, not elsewhere classified: Secondary | ICD-10-CM | POA: Diagnosis not present

## 2015-11-13 NOTE — Patient Instructions (Signed)
  Copyright  VHI. All rights reserved.  Iliotibial Band Stretch, Standing    Stand, one hand on wall, same-side leg behind other leg. Lean hips toward wall, bending front knee, keeping back knee straight. Hold __30_ seconds. Change foot position, lean to same-side. Hold __30_ seconds. Repeat __3_ times per session. Do _2__ sessions per day.  Copyright  VHI. All rights reserved.  Piriformis Stretch, Sitting    Sit, one ankle on opposite knee, same-side hand on crossed knee. Push down on knee, keeping spine straight. Lean torso forward, with flat back, until tension is felt in hamstrings and gluteals of crossed-leg side. Hold 30___ seconds.  Repeat _0__ times per session. Do _2__ sessions per day.  Copyright  VHI. All rights reserved.  Bent Leg Lift (Hook-Lying)    Tighten stomach and slowly raise right leg ___3_ inches from floor. Keep trunk rigid. Hold _5___ seconds. Repeat _10___ times per set. Do _1___ sets per session. Do ___2_ sessions per day.  http://orth.exer.us/1090   Copyright  VHI. All rights reserved.  Straight Leg Raise    Tighten stomach and slowly raise locked right leg _15___ inches from floor. Repeat _10___ times per set. Do __1__ sets per session. Do ___2_ sessions per day.  http://orth.exer.us/1102   Copyright  VHI. All rights reserved.

## 2015-11-13 NOTE — Therapy (Addendum)
[facility] [address] [place], [name], [address] Phone: [phone]   Fax:  [phone]  Physical Therapy Treatment  Patient Details  Name: Stacey Lawrence MRN: [id number] Date of Birth: [birth year] Referring Provider: Sanjuana Kava   Encounter Date: 11/13/2015      PT End of Session - 11/13/15 1005    Visit Number 2   Number of Visits 16   Date for PT Re-Evaluation 12/09/15   Authorization - Visit Number 2   Authorization - Number of Visits 10   PT Start Time [id number]   PT Stop Time 1020   PT Time Calculation (min) 43 min   Activity Tolerance Patient tolerated treatment well   Behavior During Therapy [facility] for tasks assessed/performed      Past Medical History  Diagnosis Date  . PONV (postoperative nausea and vomiting)   . Fibromyalgia   . Migraine headache     a. migraines and cluster;last migraine 3 mon ago  . Joint pain   . Joint swelling   . Scoliosis   . GERD (gastroesophageal reflux disease)     hasn't started taking any meds  . IBS (irritable bowel syndrome)     constipation  . Hx of colonic polyps   . Kidney stones 03/2010  . History of blood transfusion     35yrs ago  . Polycythemia   . CAD (coronary artery disease)     a. 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 05/2013 Lexi CL: EF 77%, old small inferolat scar, no ischemia.  . Myocardial infarction [facility]     Past Surgical History  Procedure Laterality Date  . Abdominal hysterectomy      part. hyst.  . Tubal ligation    . Cholecystectomy  10/28/2002    lap.  . Debridement tennis elbow    . Elbow surgery      golfer's elbow-bilateral  . Knee debridement      right  . Appendectomy      with explor. lap.  .  Stapedectomy      right  . Trigger finger release  08/19/2011    Procedure: RELEASE TRIGGER FINGER/A-1 PULLEY;  Surgeon: Willa Frater III;  Location: [place];  Service: Orthopedics;  Laterality: Right;  right middle finger a-1 pulley release with tenosynovectomy  . Tonsillectomy and adenoidectomy    . Diagnostic laparoscopy    . Dilation and curettage of uterus    . Tympanoplasty    . Colonoscopy    . I&d extremity  02/25/2012    Procedure: IRRIGATION AND DEBRIDEMENT EXTREMITY;  Surgeon: Roseanne Kaufman, MD;  Location: [place];  Service: Orthopedics;  Laterality: Right;  Irrigation and Debridement and Repair Right Thumb Nerve Laceration and Assoiated Structures   . Laparotomy    . Esophagogastroduodenoscopy  09/10/2012    Procedure: ESOPHAGOGASTRODUODENOSCOPY (EGD);  Surgeon: Rogene Houston, MD;  [facility];  Service: Endoscopy;  Laterality: N/A;  730  . Left heart catheterization with coronary angiogram N/A 01/16/2013    Procedure: LEFT HEART CATHETERIZATION WITH CORONARY ANGIOGRAM;  Surgeon: Jolaine Artist, MD;  Location: [facility] CATH LAB;  Service: Cardiovascular;  Laterality: N/A;    There  were no vitals filed for this visit.  Visit Diagnosis:  Leg weakness, bilateral  Hip pain, bilateral  Stiffness of hip joint, unspecified laterality  Decreased activity tolerance      Subjective Assessment - 11/13/15 0946    Subjective Pt states that the exercises she was given were painful but she did them.     Currently in Pain? Yes   Pain Score 8    Pain Location Hip   Pain Orientation Right;Left   Pain Descriptors / Indicators Aching;Burning;Throbbing   Pain Type Chronic pain   Pain Onset More than a month ago   Pain Frequency Constant   Aggravating Factors  walking, standing    Pain Relieving Factors ice                          [facility] Adult PT Treatment/Exercise - 11/13/15 0001    Exercises   Exercises Lumbar;Knee/Hip   Lumbar  Exercises: Stretches   Active Hamstring Stretch 2 reps;30 seconds   Single Knee to Chest Stretch 2 reps;30 seconds   ITB Stretch 2 reps;30 seconds   ITB Stretch Limitations bilateral    Piriformis Stretch 3 reps;30 seconds   Piriformis Stretch Limitations sitting    Lumbar Exercises: Standing   Other Standing Lumbar Exercises 3 D hip excursion    Lumbar Exercises: Supine   Ab Set 5 reps   Clam 10 reps   Bent Knee Raise 10 reps   Bridge 10 reps   Straight Leg Raise 10 reps   Modalities   Modalities Ultrasound   Ultrasound   Ultrasound Location B hip    Ultrasound Parameters pulsed 1.25 w/cm 2 x 5' each LE   done with pt .4 dexamethasone ordered by MD    Ultrasound Goals Pain                PT Education - 11/13/15 1003    Education provided Yes   Education Details new stretches and stabilizaton for HEP    Person(s) Educated Patient   Methods Explanation;Tactile cues;Verbal cues;Handout   Comprehension Verbalized understanding;Returned demonstration          PT Short Term Goals - 11/13/15 1008    PT SHORT TERM GOAL #1   Title Pt will improve ROM to within functional limits to be able to pick items off the floor independently as well as allowig pt to  don shoes and socks with ease    Time 3   Period Weeks   Status On-going   PT SHORT TERM GOAL #2   Title Pt will improve LE strenth to 4/5 to allow pt to ambulate 1,000 feet with equal step length in six minutes to feel confident going walking in her yard.   Time 3   Period Weeks   Status On-going   PT SHORT TERM GOAL #3   Title Pt to be knowledgable about engaging core musculature while completing household activities to allow pt to be able to mop and vacuum .   Time 3   Period Weeks   Status On-going   PT SHORT TERM GOAL #4   Title Pt pain to decrease to 6/10 to allow pt to be able to sleep for two hours at a time.    Time 3   Period Weeks   Status On-going           PT Long Term Goals - 11/13/15 1009     PT LONG TERM GOAL #1  Title Pt strength to be at least 4+/5 to allow pt to walk on uneven surfaces for 20 minutes at a time    Time 8   Period Weeks   Status On-going   PT LONG TERM GOAL #2   Title PT to be able to demonstrate good body mechanics with lifting to prevent increase stress on back and hips while lifting.    Time 8   Period Weeks   Status On-going   PT LONG TERM GOAL #3   Title Pt to be able to verbalize ways to decrease her pain besides medication to include but not limited to TENS, ice, stretching, postioning to allow minimal medication intake.    Time 8   Period Weeks   Status On-going   PT LONG TERM GOAL #4   Title Pt to be able to ambulate 1200 ft in six minutes to demonstrate improved activity tolerance    Time 8   Period Weeks   Status On-going   PT LONG TERM GOAL #5   Title Patient pain to be no greater than 4/10 to allow pt to be able to sleep three hours straight.    Time 8   Period Weeks   Status On-going               Plan - 11/13/15 1006    Clinical Impression Statement Evaluation given and reviewed with patient.  Pt instructed in new exercises with verbal cuing needed for proper technique.  Pt able to demonstrate good form with constant verbal cuing.  Began phonophoresis with .4 mg dexamethasone which a secondary MD has prescribed but current MD is aware of and acknowledges.  Pt will bring this to treatment sessions.  Pt has decreased ROM in hips and lower back verbalizing that this causes her to have difficulty putting her shoes and socks on  pt shown stretches to improve mobility    Pt will benefit from skilled therapeutic intervention in order to improve on the following deficits Decreased activity tolerance;Decreased mobility;Decreased strength;Difficulty walking;Impaired perceived functional ability;Impaired flexibility;Pain   PT Duration 8 weeks   PT Next Visit Plan begin prone and standing stabilization exercises.          Problem  List Patient Active Problem List   Diagnosis Date Noted  . Trochanteric bursitis of both hips 10/31/2015  . Chest pain at rest 06/17/2015  . Inferior myocardial infarction ([place]) 06/17/2015  . Sprain of interphalangeal joint of left little finger 02/05/2014  . Pain in joint, hand 02/05/2014  . Midsternal chest pain 06/04/2013  . CAD (coronary artery disease)   . Fibromyalgia   . GERD (gastroesophageal reflux disease)   . Migraine headache   . Acute non-STEMI < 8 weeks prior, subsequent admission after initial 01/16/2013  . Radial nerve laceration 03/27/2012  . Lack of coordination 03/27/2012  . Muscle weakness (generalized) 03/27/2012  Rayetta Humphrey, PT CLT [phone] 11/13/2015, 10:32 AM  [facility] [address] [place], [name], [address] Phone: [phone]   Fax:  [phone]  Name: Stacey Lawrence MRN: [id number] Date of Birth: 06-Mar-1951

## 2015-11-16 ENCOUNTER — Ambulatory Visit (HOSPITAL_COMMUNITY): Payer: 59

## 2015-11-16 ENCOUNTER — Ambulatory Visit (HOSPITAL_COMMUNITY): Payer: 59 | Admitting: Physical Therapy

## 2015-11-16 DIAGNOSIS — M25551 Pain in right hip: Secondary | ICD-10-CM

## 2015-11-16 DIAGNOSIS — M25552 Pain in left hip: Secondary | ICD-10-CM

## 2015-11-16 DIAGNOSIS — R29898 Other symptoms and signs involving the musculoskeletal system: Secondary | ICD-10-CM | POA: Diagnosis not present

## 2015-11-16 DIAGNOSIS — R6889 Other general symptoms and signs: Secondary | ICD-10-CM | POA: Diagnosis not present

## 2015-11-16 DIAGNOSIS — M25659 Stiffness of unspecified hip, not elsewhere classified: Secondary | ICD-10-CM | POA: Diagnosis not present

## 2015-11-16 NOTE — Therapy (Signed)
[facility] [address] [place], [name], [address] Phone: [phone]   Fax:  [phone]  Physical Therapy Treatment  Patient Details  Name: Stacey Lawrence MRN: [id number] Date of Birth: [birth year] Referring Provider: Sanjuana Kava   Encounter Date: 11/16/2015      PT End of Session - 11/16/15 1300    Visit Number 3   Number of Visits 16   Date for PT Re-Evaluation 12/09/15   Authorization - Visit Number 3   Authorization - Number of Visits 10   PT Start Time [phone]   PT Stop Time 1035   PT Time Calculation (min) 57 min   Activity Tolerance Patient tolerated treatment well   Behavior During Therapy [facility] for tasks assessed/performed      Past Medical History  Diagnosis Date  . PONV (postoperative nausea and vomiting)   . Fibromyalgia   . Migraine headache     a. migraines and cluster;last migraine 3 mon ago  . Joint pain   . Joint swelling   . Scoliosis   . GERD (gastroesophageal reflux disease)     hasn't started taking any meds  . IBS (irritable bowel syndrome)     constipation  . Hx of colonic polyps   . Kidney stones 03/2010  . History of blood transfusion     23yrs ago  . Polycythemia   . CAD (coronary artery disease)     a. 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 05/2013 Lexi CL: EF 77%, old small inferolat scar, no ischemia.  . Myocardial infarction [facility])     Past Surgical History  Procedure Laterality Date  . Abdominal hysterectomy      part. hyst.  . Tubal ligation    . Cholecystectomy  10/28/2002    lap.  . Debridement tennis elbow    . Elbow surgery      golfer's elbow-bilateral  . Knee debridement      right  . Appendectomy      with explor. lap.  .  Stapedectomy      right  . Trigger finger release  08/19/2011    Procedure: RELEASE TRIGGER FINGER/A-1 PULLEY;  Surgeon: Willa Frater III;  Location: [place];  Service: Orthopedics;  Laterality: Right;  right middle finger a-1 pulley release with tenosynovectomy  . Tonsillectomy and adenoidectomy    . Diagnostic laparoscopy    . Dilation and curettage of uterus    . Tympanoplasty    . Colonoscopy    . I&d extremity  02/25/2012    Procedure: IRRIGATION AND DEBRIDEMENT EXTREMITY;  Surgeon: Roseanne Kaufman, MD;  Location: [place];  Service: Orthopedics;  Laterality: Right;  Irrigation and Debridement and Repair Right Thumb Nerve Laceration and Assoiated Structures   . Laparotomy    . Esophagogastroduodenoscopy  09/10/2012    Procedure: ESOPHAGOGASTRODUODENOSCOPY (EGD);  Surgeon: Rogene Houston, MD;  [facility];  Service: Endoscopy;  Laterality: N/A;  730  . Left heart catheterization with coronary angiogram N/A 01/16/2013    Procedure: LEFT HEART CATHETERIZATION WITH CORONARY ANGIOGRAM;  Surgeon: Jolaine Artist, MD;  Location: [facility] CATH LAB;  Service: Cardiovascular;  Laterality: N/A;    There  were no vitals filed for this visit.  Visit Diagnosis:  Leg weakness, bilateral  Hip pain, bilateral  Stiffness of hip joint, unspecified laterality  Decreased activity tolerance      Subjective Assessment - 11/16/15 1529    Subjective PT states her hips are still hurting the same.  8/10 in bilateral hips today.   Currently in Pain? Yes   Pain Score 8    Pain Location Hip   Pain Orientation Right;Left   Pain Descriptors / Indicators Aching;Burning;Throbbing                         [facility] Adult PT Treatment/Exercise - 11/16/15 1528    Lumbar Exercises: Stretches   Active Hamstring Stretch 30 seconds;3 reps   Single Knee to Chest Stretch 30 seconds;3 reps   ITB Stretch 30 seconds;3 reps   ITB Stretch Limitations bilateral    Lumbar  Exercises: Supine   Ab Set 10 reps   Clam 10 reps   Bent Knee Raise 10 reps   Bridge 10 reps   Straight Leg Raise 10 reps   Modalities   Modalities Ultrasound   Ultrasound   Ultrasound Location bilateral hips   Ultrasound Parameters pulsed 1.2w/cm2 pulsed with dexamethasone   Ultrasound Goals Pain                  PT Short Term Goals - 11/13/15 1008    PT SHORT TERM GOAL #1   Title Pt will improve ROM to within functional limits to be able to pick items off the floor independently as well as allowig pt to  don shoes and socks with ease    Time 3   Period Weeks   Status On-going   PT SHORT TERM GOAL #2   Title Pt will improve LE strenth to 4/5 to allow pt to ambulate 1,000 feet with equal step length in six minutes to feel confident going walking in her yard.   Time 3   Period Weeks   Status On-going   PT SHORT TERM GOAL #3   Title Pt to be knowledgable about engaging core musculature while completing household activities to allow pt to be able to mop and vacuum .   Time 3   Period Weeks   Status On-going   PT SHORT TERM GOAL #4   Title Pt pain to decrease to 6/10 to allow pt to be able to sleep for two hours at a time.    Time 3   Period Weeks   Status On-going           PT Long Term Goals - 11/13/15 1009    PT LONG TERM GOAL #1   Title Pt strength to be at least 4+/5 to allow pt to walk on uneven surfaces for 20 minutes at a time    Time 8   Period Weeks   Status On-going   PT LONG TERM GOAL #2   Title PT to be able to demonstrate good body mechanics with lifting to prevent increase stress on back and hips while lifting.    Time 8   Period Weeks   Status On-going   PT LONG TERM GOAL #3   Title Pt to be able to verbalize ways to decrease her pain besides medication to include but not limited to TENS, ice, stretching, postioning to allow minimal medication intake.    Time 8   Period Weeks   Status On-going   PT  LONG TERM GOAL #4   Title Pt to be  able to ambulate 1200 ft in six minutes to demonstrate improved activity tolerance    Time 8   Period Weeks   Status On-going   PT LONG TERM GOAL #5   Title Patient pain to be no greater than 4/10 to allow pt to be able to sleep three hours straight.    Time 8   Period Weeks   Status On-going               Plan - 11/16/15 1302    Clinical Impression Statement Continued with exercise instruction with minimal cues needed to complete in correct form . No new exercises added this session due to time.  [place] completed by PT technician with close supervision Lenora Boys) using dexamethasone as ordered by MD.  Pt reported no immediate effects from treatment but wtihout increased pain at end of session.     Pt will benefit from skilled therapeutic intervention in order to improve on the following deficits Decreased activity tolerance;Decreased mobility;Decreased strength;Difficulty walking;Impaired perceived functional ability;Impaired flexibility;Pain   PT Duration 8 weeks   PT Next Visit Plan begin prone and standing stabilization exercises next session.  continue to progress reps of estabilished therex.         Problem List Patient Active Problem List   Diagnosis Date Noted  . Trochanteric bursitis of both hips 10/31/2015  . Chest pain at rest 06/17/2015  . Inferior myocardial infarction ([place]) 06/17/2015  . Sprain of interphalangeal joint of left little finger 02/05/2014  . Pain in joint, hand 02/05/2014  . Midsternal chest pain 06/04/2013  . CAD (coronary artery disease)   . Fibromyalgia   . GERD (gastroesophageal reflux disease)   . Migraine headache   . Acute non-STEMI < 8 weeks prior, subsequent admission after initial 01/16/2013  . Radial nerve laceration 03/27/2012  . Lack of coordination 03/27/2012  . Muscle weakness (generalized) 03/27/2012    Teena Irani, PTA/CLT [phone]  11/16/2015, 3:32 PM  [facility] [address] [place], [name], [address] Phone: [phone]   Fax:  [phone]  Name: Stacey Lawrence MRN: [id number] Date of Birth: [birth year]

## 2015-11-19 ENCOUNTER — Ambulatory Visit (HOSPITAL_COMMUNITY): Payer: 59 | Attending: Orthopaedic Surgery

## 2015-11-19 DIAGNOSIS — M25659 Stiffness of unspecified hip, not elsewhere classified: Secondary | ICD-10-CM | POA: Insufficient documentation

## 2015-11-19 DIAGNOSIS — M25552 Pain in left hip: Secondary | ICD-10-CM | POA: Insufficient documentation

## 2015-11-19 DIAGNOSIS — M25551 Pain in right hip: Secondary | ICD-10-CM | POA: Diagnosis not present

## 2015-11-19 DIAGNOSIS — M6281 Muscle weakness (generalized): Secondary | ICD-10-CM | POA: Insufficient documentation

## 2015-11-19 DIAGNOSIS — R6889 Other general symptoms and signs: Secondary | ICD-10-CM | POA: Insufficient documentation

## 2015-11-19 DIAGNOSIS — R29898 Other symptoms and signs involving the musculoskeletal system: Secondary | ICD-10-CM | POA: Insufficient documentation

## 2015-11-19 NOTE — Therapy (Signed)
[place], [name], [address] Phone: [phone]   Fax:  [phone]  Physical Therapy Treatment  Patient Details  Name: Stacey Lawrence MRN: [id number] Date of Birth: 04-24-1951 Referring Provider: Sanjuana Kava   Encounter Date: 11/19/2015      PT End of Session - 11/19/15 1450    Visit Number 4   Number of Visits 16   Date for PT Re-Evaluation 12/09/15   Authorization Type UMR   Authorization - Visit Number 4   Authorization - Number of Visits 10   PT Start Time [id number]   PT Stop Time 1536   PT Time Calculation (min) 49 min   Activity Tolerance Patient tolerated treatment well   Behavior During Therapy [facility] for tasks assessed/performed      Past Medical History  Diagnosis Date  . PONV (postoperative nausea and vomiting)   . Fibromyalgia   . Migraine headache     a. migraines and cluster;last migraine 3 mon ago  . Joint pain   . Joint swelling   . Scoliosis   . GERD (gastroesophageal reflux disease)     hasn't started taking any meds  . IBS (irritable bowel syndrome)     constipation  . Hx of colonic polyps   . Kidney stones 03/2010  . History of blood transfusion     28yrs ago  . Polycythemia   . CAD (coronary artery disease)     a. 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 05/2013 [facility] CL: EF 77%, old small inferolat scar, no ischemia.  . Myocardial infarction [facility])     Past Surgical History  Procedure Laterality Date  . Abdominal hysterectomy      part. hyst.  . Tubal ligation    . Cholecystectomy  10/28/2002    lap.  . Debridement tennis elbow    . Elbow surgery      golfer's elbow-bilateral  . Knee debridement      right  . Appendectomy      with  explor. lap.  . Stapedectomy      right  . Trigger finger release  08/19/2011    Procedure: RELEASE TRIGGER FINGER/A-1 PULLEY;  Surgeon: Willa Frater III;  Location: [place];  Service: Orthopedics;  Laterality: Right;  right middle finger a-1 pulley release with tenosynovectomy  . Tonsillectomy and adenoidectomy    . Diagnostic laparoscopy    . Dilation and curettage of uterus    . Tympanoplasty    . Colonoscopy    . I&d extremity  02/25/2012    Procedure: IRRIGATION AND DEBRIDEMENT EXTREMITY;  Surgeon: Roseanne Kaufman, MD;  Location: [place];  Service: Orthopedics;  Laterality: Right;  Irrigation and Debridement and Repair Right Thumb Nerve Laceration and Assoiated Structures   . Laparotomy    . Esophagogastroduodenoscopy  09/10/2012    Procedure: ESOPHAGOGASTRODUODENOSCOPY (EGD);  Surgeon: Rogene Houston, MD;  [facility];  Service: Endoscopy;  Laterality: N/A;  730  . Left heart catheterization with coronary angiogram N/A 01/16/2013    Procedure: LEFT HEART CATHETERIZATION WITH CORONARY ANGIOGRAM;  Surgeon: Jolaine Artist, MD;  Location: [facility] CATH LAB;  Service: Cardiovascular;  Laterality:  N/A;    There were no vitals filed for this visit.  Visit Diagnosis:  Leg weakness, bilateral  Hip pain, bilateral  Stiffness of hip joint, unspecified laterality  Decreased activity tolerance      Subjective Assessment - 11/19/15 1452    Subjective LBP and B hip pain were rated a 9/10 on a VAS. Pt noted that she had to run after her dog earlier today and as a result her hips and back " are killing me".    Pertinent History five MI's; HNP, varicose insufficiency,    Limitations Standing;Walking;Lifting   Patient Stated Goals be able to sleep longer, decrease pain,    Currently in Pain? Yes   Pain Score 9    Pain Location Hip   Pain Orientation Right;Left   Pain Descriptors / Indicators Aching;Burning;Throbbing   Pain Type Chronic pain   Pain Onset More  than a month ago   Pain Frequency Constant   Aggravating Factors  limiting her ability to ambulate long distances and prolonged standing   Pain Relieving Factors ice and rest    Multiple Pain Sites Yes   Pain Score 9   Pain Location Back   Pain Orientation Left;Right;Lower   Pain Descriptors / Indicators Aching;Burning;Sharp   Pain Type Acute pain   Pain Onset More than a month ago   Pain Frequency Constant   Aggravating Factors  limiting her ability to ambulate long distances, prolonged standing, and bending forward   Pain Relieving Factors ice, TENS, and rest    Effect of Pain on Daily Activities limiting her ability to ambulate long distances                          [facility] Adult PT Treatment/Exercise - 11/19/15 0001    Exercises   Exercises Lumbar;Knee/Hip   Lumbar Exercises: Stretches   Active Hamstring Stretch 30 seconds;3 reps   Active Hamstring Stretch Limitations bilateral (HS 90/90 position)   Single Knee to Chest Stretch 30 seconds;3 reps   Single Knee to Chest Stretch Limitations bilateral    Prone on Elbows Stretch 2 reps;60 seconds   Press Ups 1 rep;Other (comment)  10 reps   ITB Stretch 30 seconds;3 reps   ITB Stretch Limitations bilateral    Piriformis Stretch 3 reps;30 seconds   Piriformis Stretch Limitations bilateral; supine; manually    Lumbar Exercises: Supine   Clam 10 reps   Clam Limitations B hip IR/ER isometrics in hooklying position    Bent Knee Raise 15 reps   Bent Knee Raise Limitations 5 sec hold    Bridge 2 seconds;15 reps   Straight Leg Raises Limitations Held due to positive SLR test   Manual Therapy   Manual Therapy Neural Stretch;Passive ROM   Manual therapy comments completed at the end of PT tx seperate from other interventions    Passive ROM B hip PROM into IR/ER/flex/ext x 10 reps    Neural Stretch 2 sets of 20 reps; completed nerve mobs with ankle DF/PF at ~20 degrees SLR position, bilaterally                 PT Education - 11/19/15 1522    Education provided Yes   Education Details Educated pt on current HEP, core bracing with functional mobility activities, and avoidance of bending forward and heavy lifting    Person(s) Educated Patient   Methods Explanation   Comprehension Verbalized understanding  PT Short Term Goals - 11/13/15 1008    PT SHORT TERM GOAL #1   Title Pt will improve ROM to within functional limits to be able to pick items off the floor independently as well as allowig pt to  don shoes and socks with ease    Time 3   Period Weeks   Status On-going   PT SHORT TERM GOAL #2   Title Pt will improve LE strenth to 4/5 to allow pt to ambulate 1,000 feet with equal step length in six minutes to feel confident going walking in her yard.   Time 3   Period Weeks   Status On-going   PT SHORT TERM GOAL #3   Title Pt to be knowledgable about engaging core musculature while completing household activities to allow pt to be able to mop and vacuum .   Time 3   Period Weeks   Status On-going   PT SHORT TERM GOAL #4   Title Pt pain to decrease to 6/10 to allow pt to be able to sleep for two hours at a time.    Time 3   Period Weeks   Status On-going           PT Long Term Goals - 11/13/15 1009    PT LONG TERM GOAL #1   Title Pt strength to be at least 4+/5 to allow pt to walk on uneven surfaces for 20 minutes at a time    Time 8   Period Weeks   Status On-going   PT LONG TERM GOAL #2   Title PT to be able to demonstrate good body mechanics with lifting to prevent increase stress on back and hips while lifting.    Time 8   Period Weeks   Status On-going   PT LONG TERM GOAL #3   Title Pt to be able to verbalize ways to decrease her pain besides medication to include but not limited to TENS, ice, stretching, postioning to allow minimal medication intake.    Time 8   Period Weeks   Status On-going   PT LONG TERM GOAL #4   Title Pt to be able to ambulate 1200 ft  in six minutes to demonstrate improved activity tolerance    Time 8   Period Weeks   Status On-going   PT LONG TERM GOAL #5   Title Patient pain to be no greater than 4/10 to allow pt to be able to sleep three hours straight.    Time 8   Period Weeks   Status On-going               Plan - 11/19/15 1451    Clinical Impression Statement PT tx was focused on core stabilization ther ex, LE stretches, added LE nerve glides, and pt education. Pt c/o radicular pain in B LE once LE was brought into SLR position at ~30 degrees. Added nerve glides with good tolerance reported. Active SLR was held this visit due to neural tension. Reviewed and completed core stabilization in supine with alternating hip flexion with addition of contralateral alternating shoulder flexion. Good tolerance reported with progression. Standing core stabilization was not initiated this visit due to increased LBP upon arrival. Will defer standing core stabilization ther ex to next visit if appropriate. Added B hip IR/ER isometrics in hooklying position with fair tolerance reported. Pt responded well to prone position with LBP reduced from 9/10 to 6/10 on a VAS. B hip pain was rated a 8/10  on a VAS at the completion of PT visit. No changes in symptoms reported with one set of repeated prone press-ups. Continue with current POC and progress as tolerated.    Pt will benefit from skilled therapeutic intervention in order to improve on the following deficits Decreased activity tolerance;Decreased mobility;Decreased strength;Difficulty walking;Impaired perceived functional ability;Impaired flexibility;Pain   Rehab Potential Fair   PT Frequency 2x / week   PT Duration 8 weeks   PT Treatment/Interventions Patient/family education;Gait training;Functional mobility training;Therapeutic activities;Therapeutic exercise;Balance training;Ultrasound;Moist Heat;Electrical Stimulation;Cryotherapy;Iontophoresis 4mg /ml Dexamethasone   PT Next  Visit Plan Next visit to focus core stabilization in supine, LE stretches, reassess prone press-up progression, and modalities to manage pain.    Consulted and Agree with Plan of Care Patient        Problem List Patient Active Problem List   Diagnosis Date Noted  . Trochanteric bursitis of both hips 10/31/2015  . Chest pain at rest 06/17/2015  . Inferior myocardial infarction ([place]) 06/17/2015  . Sprain of interphalangeal joint of left little finger 02/05/2014  . Pain in joint, hand 02/05/2014  . Midsternal chest pain 06/04/2013  . CAD (coronary artery disease)   . Fibromyalgia   . GERD (gastroesophageal reflux disease)   . Migraine headache   . Acute non-STEMI < 8 weeks prior, subsequent admission after initial 01/16/2013  . Radial nerve laceration 03/27/2012  . Lack of coordination 03/27/2012  . Muscle weakness (generalized) 03/27/2012    Garen Lah, PT, DPT  11/19/2015, 5:39 PM  [place] [address] [place], [name], [address] Phone: [phone]   Fax:  [phone]  Name: ALYSSON RESSLER MRN: [id number] Date of Birth: Nov 08, 1950

## 2015-11-20 ENCOUNTER — Encounter: Payer: Self-pay | Admitting: Orthopaedic Surgery

## 2015-11-20 ENCOUNTER — Telehealth: Payer: Self-pay | Admitting: Orthopaedic Surgery

## 2015-11-20 DIAGNOSIS — K5909 Other constipation: Secondary | ICD-10-CM | POA: Diagnosis not present

## 2015-11-20 DIAGNOSIS — I2510 Atherosclerotic heart disease of native coronary artery without angina pectoris: Secondary | ICD-10-CM | POA: Diagnosis not present

## 2015-11-20 DIAGNOSIS — E559 Vitamin D deficiency, unspecified: Secondary | ICD-10-CM | POA: Diagnosis not present

## 2015-11-20 DIAGNOSIS — M2550 Pain in unspecified joint: Secondary | ICD-10-CM | POA: Diagnosis not present

## 2015-11-20 DIAGNOSIS — G894 Chronic pain syndrome: Secondary | ICD-10-CM | POA: Diagnosis not present

## 2015-11-20 NOTE — Telephone Encounter (Signed)
Patient called and wanted to know if we could revise her work note.  She needs the note to state the restrictions that Dr. Luna Glasgow and she agreed on but to state that these restrictions would last for 2 1/2 years.  I spoke to Dr. Luna Glasgow and he said this was fine.  Note has been written.

## 2015-11-24 ENCOUNTER — Ambulatory Visit (HOSPITAL_COMMUNITY): Payer: 59

## 2015-11-24 DIAGNOSIS — R6889 Other general symptoms and signs: Secondary | ICD-10-CM | POA: Diagnosis not present

## 2015-11-24 DIAGNOSIS — M25659 Stiffness of unspecified hip, not elsewhere classified: Secondary | ICD-10-CM | POA: Diagnosis not present

## 2015-11-24 DIAGNOSIS — R29898 Other symptoms and signs involving the musculoskeletal system: Secondary | ICD-10-CM | POA: Diagnosis not present

## 2015-11-24 DIAGNOSIS — M25551 Pain in right hip: Secondary | ICD-10-CM | POA: Diagnosis not present

## 2015-11-24 DIAGNOSIS — M25552 Pain in left hip: Secondary | ICD-10-CM | POA: Diagnosis not present

## 2015-11-24 DIAGNOSIS — M6281 Muscle weakness (generalized): Secondary | ICD-10-CM

## 2015-11-24 NOTE — Therapy (Signed)
[place], [name], [address] Phone: [phone]   Fax:  [phone]  Physical Therapy Treatment  Patient Details  Name: Stacey Lawrence MRN: [id number] Date of Birth: [birth year] Referring Provider: Sanjuana Kava   Encounter Date: 11/24/2015      PT End of Session - 11/24/15 0846    Visit Number 5   Number of Visits 16   Date for PT Re-Evaluation 12/09/15   Authorization Type UMR   Authorization - Visit Number 5   Authorization - Number of Visits 10   PT Start Time [phone]   PT Stop Time 0925   PT Time Calculation (min) 50 min   Activity Tolerance Patient tolerated treatment well;Patient limited by pain   Behavior During Therapy [facility] for tasks assessed/performed      Past Medical History  Diagnosis Date  . PONV (postoperative nausea and vomiting)   . Fibromyalgia   . Migraine headache     a. migraines and cluster;last migraine 3 mon ago  . Joint pain   . Joint swelling   . Scoliosis   . GERD (gastroesophageal reflux disease)     hasn't started taking any meds  . IBS (irritable bowel syndrome)     constipation  . Hx of colonic polyps   . Kidney stones 03/2010  . History of blood transfusion     48yrs ago  . Polycythemia   . CAD (coronary artery disease)     a. 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 05/2013 [facility] CL: EF 77%, old small inferolat scar, no ischemia.  . Myocardial infarction [facility])     Past Surgical History  Procedure Laterality Date  . Abdominal hysterectomy      part. hyst.  . Tubal ligation    . Cholecystectomy  10/28/2002    lap.  . Debridement tennis elbow    . Elbow surgery      golfer's elbow-bilateral  . Knee debridement      right  .  Appendectomy      with explor. lap.  . Stapedectomy      right  . Trigger finger release  08/19/2011    Procedure: RELEASE TRIGGER FINGER/A-1 PULLEY;  Surgeon: Willa Frater III;  Location: [place];  Service: Orthopedics;  Laterality: Right;  right middle finger a-1 pulley release with tenosynovectomy  . Tonsillectomy and adenoidectomy    . Diagnostic laparoscopy    . Dilation and curettage of uterus    . Tympanoplasty    . Colonoscopy    . I&d extremity  02/25/2012    Procedure: IRRIGATION AND DEBRIDEMENT EXTREMITY;  Surgeon: Roseanne Kaufman, MD;  Location: [place];  Service: Orthopedics;  Laterality: Right;  Irrigation and Debridement and Repair Right Thumb Nerve Laceration and Assoiated Structures   . Laparotomy    . Esophagogastroduodenoscopy  09/10/2012    Procedure: ESOPHAGOGASTRODUODENOSCOPY (EGD);  Surgeon: Rogene Houston, MD;  [facility];  Service: Endoscopy;  Laterality: N/A;  730  . Left heart catheterization with coronary angiogram N/A 01/16/2013    Procedure: LEFT HEART CATHETERIZATION WITH CORONARY ANGIOGRAM;  Surgeon: Jolaine Artist, MD;  Location: [facility] CATH LAB;  Service:  Cardiovascular;  Laterality: N/A;    There were no vitals filed for this visit.  Visit Diagnosis:  Leg weakness, bilateral  Hip pain, bilateral  Stiffness of hip joint, unspecified laterality  Decreased activity tolerance  Muscle weakness (generalized)      Subjective Assessment - 11/24/15 0835    Subjective Pt stated LBP and Bil hip pain scale 7-8/10, reports Rt>Lt LE tingling and LBP burning both sides.  Reports she has been in bed over sinus problems   Pertinent History five MI's; HNP, varicose insufficiency,    Patient Stated Goals be able to sleep longer, decrease pain,    Currently in Pain? Yes   Pain Score 8    Pain Location Back   Pain Orientation Right;Left   Pain Descriptors / Indicators Burning;Aching   Pain Type Chronic pain   Pain Onset More than  a month ago   Pain Frequency Constant   Aggravating Factors  limiting her ability to ambulate long distances and prolonged standing; bending over to pick up trash out of ban   Pain Relieving Factors ice and rest   Effect of Pain on Daily Activities increases   Multiple Pain Sites Yes   Pain Score 8   Pain Location Hip   Pain Orientation Right;Left   Pain Descriptors / Indicators Dull;Tingling;Aching;Burning   Pain Type Acute pain   Pain Onset More than a month ago   Pain Frequency Constant   Aggravating Factors  limiting her ability to ambulate long distances, prolonged standing, and bending forward   Pain Relieving Factors ice, TENS, and rest   Effect of Pain on Daily Activities limiting her ability to ambulate long distances                  [facility] Adult PT Treatment/Exercise - 11/24/15 0001    Exercises   Exercises Lumbar   Lumbar Exercises: Stretches   Active Hamstring Stretch --   Active Hamstring Stretch Limitations --   Passive Hamstring Stretch 3 reps;30 seconds   Passive Hamstring Stretch Limitations bilateral (HS 90/90 position); supine   Single Knee to Chest Stretch 30 seconds;3 reps   Single Knee to Chest Stretch Limitations bilateral    Prone on Elbows Stretch 2 reps;60 seconds   Press Ups 1 rep;Other (comment)  10 reps   ITB Stretch 30 seconds;3 reps   ITB Stretch Limitations bilateral passive   Piriformis Stretch 3 reps;30 seconds   Piriformis Stretch Limitations bilateral; supine; manually    Lumbar Exercises: Supine   Clam 10 reps   Clam Limitations B hip IR/ER isometrics in hooklying position    Bent Knee Raise 15 reps   Bent Knee Raise Limitations 5 sec hold    Bridge 15 reps;3 seconds   Straight Leg Raises Limitations Held due to positive SLR test   Manual Therapy   Manual Therapy Neural Stretch;Passive ROM   Manual therapy comments completed at the end of PT tx seperate from other interventions    Passive ROM B hip PROM into IR/ER/flex/ext x  10 reps    Neural Stretch 1 sets of 10 reps Rt LE reports of increased pain today, not comlete on Lt LE; completed nerve mobs with ankle DF/PF at ~20 degrees SLR position, bilaterally                  PT Short Term Goals - 11/13/15 1008    PT SHORT TERM GOAL #1   Title Pt will improve ROM to within functional limits to  be able to pick items off the floor independently as well as allowig pt to  don shoes and socks with ease    Time 3   Period Weeks   Status On-going   PT SHORT TERM GOAL #2   Title Pt will improve LE strenth to 4/5 to allow pt to ambulate 1,000 feet with equal step length in six minutes to feel confident going walking in her yard.   Time 3   Period Weeks   Status On-going   PT SHORT TERM GOAL #3   Title Pt to be knowledgable about engaging core musculature while completing household activities to allow pt to be able to mop and vacuum .   Time 3   Period Weeks   Status On-going   PT SHORT TERM GOAL #4   Title Pt pain to decrease to 6/10 to allow pt to be able to sleep for two hours at a time.    Time 3   Period Weeks   Status On-going           PT Long Term Goals - 11/13/15 1009    PT LONG TERM GOAL #1   Title Pt strength to be at least 4+/5 to allow pt to walk on uneven surfaces for 20 minutes at a time    Time 8   Period Weeks   Status On-going   PT LONG TERM GOAL #2   Title PT to be able to demonstrate good body mechanics with lifting to prevent increase stress on back and hips while lifting.    Time 8   Period Weeks   Status On-going   PT LONG TERM GOAL #3   Title Pt to be able to verbalize ways to decrease her pain besides medication to include but not limited to TENS, ice, stretching, postioning to allow minimal medication intake.    Time 8   Period Weeks   Status On-going   PT LONG TERM GOAL #4   Title Pt to be able to ambulate 1200 ft in six minutes to demonstrate improved activity tolerance    Time 8   Period Weeks   Status On-going    PT LONG TERM GOAL #5   Title Patient pain to be no greater than 4/10 to allow pt to be able to sleep three hours straight.    Time 8   Period Weeks   Status On-going               Plan - 11/24/15 0846    Clinical Impression Statement Session focus on improving core stabilization and functional stretches to reduce radicular symptoms.  Pt limited by high levels of pain in lower back and Bil LE Rt>Lt LE radicular symptoms this session.  Pt reports increased irritation following manual nerve stretches.  Pt tolerated well with manual hamstring, ITB and piriformis stretches as well as PROM.  Reports of relief with POE, noted decreased lumbar extension mobility with reports of increased irritation with prone press-up this session. No reports of increased pain at end of session.     Pt will benefit from skilled therapeutic intervention in order to improve on the following deficits Decreased activity tolerance;Decreased mobility;Decreased strength;Difficulty walking;Impaired perceived functional ability;Impaired flexibility;Pain   Rehab Potential Fair   PT Frequency 2x / week   PT Duration 8 weeks   PT Treatment/Interventions Patient/family education;Gait training;Functional mobility training;Therapeutic activities;Therapeutic exercise;Balance training;Ultrasound;Moist Heat;Electrical Stimulation;Cryotherapy;Iontophoresis 4mg /ml Dexamethasone   PT Next Visit Plan Next visit to focus core stabilization in  supine, LE stretches, reassess prone press-up progression, and modalities to manage pain.         Problem List Patient Active Problem List   Diagnosis Date Noted  . Trochanteric bursitis of both hips 10/31/2015  . Chest pain at rest 06/17/2015  . Inferior myocardial infarction ([place]) 06/17/2015  . Sprain of interphalangeal joint of left little finger 02/05/2014  . Pain in joint, hand 02/05/2014  . Midsternal chest pain 06/04/2013  . CAD (coronary artery disease)   . Fibromyalgia   .  GERD (gastroesophageal reflux disease)   . Migraine headache   . Acute non-STEMI < 8 weeks prior, subsequent admission after initial 01/16/2013  . Radial nerve laceration 03/27/2012  . Lack of coordination 03/27/2012  . Muscle weakness (generalized) 03/27/2012   Ihor Austin, [place]; [place]  Aldona Lento 11/24/2015, 11:39 AM  [place], [name], [address] Phone: [phone]   Fax:  [phone]  Name: NATACHIA MASTROMARINO MRN: [id number] Date of Birth: 27-Apr-1951

## 2015-11-26 ENCOUNTER — Ambulatory Visit (HOSPITAL_COMMUNITY): Payer: 59 | Admitting: Physical Therapy

## 2015-11-26 DIAGNOSIS — R6889 Other general symptoms and signs: Secondary | ICD-10-CM

## 2015-11-26 DIAGNOSIS — M25551 Pain in right hip: Secondary | ICD-10-CM | POA: Diagnosis not present

## 2015-11-26 DIAGNOSIS — M6281 Muscle weakness (generalized): Secondary | ICD-10-CM | POA: Diagnosis not present

## 2015-11-26 DIAGNOSIS — R29898 Other symptoms and signs involving the musculoskeletal system: Secondary | ICD-10-CM | POA: Diagnosis not present

## 2015-11-26 DIAGNOSIS — M25552 Pain in left hip: Secondary | ICD-10-CM

## 2015-11-26 DIAGNOSIS — M25659 Stiffness of unspecified hip, not elsewhere classified: Secondary | ICD-10-CM

## 2015-11-26 NOTE — Therapy (Signed)
[place], [name], [address] Phone: [phone]   Fax:  [phone]  Physical Therapy Treatment  Patient Details  Name: TUNJA IVINS MRN: [id number] Date of Birth: 09/10/1951 Referring Provider: Sanjuana Kava   Encounter Date: 11/26/2015      PT End of Session - 11/26/15 0902    Visit Number 6   Number of Visits 16   Date for PT Re-Evaluation 12/09/15   Authorization Type UMR   Authorization - Visit Number 6   Authorization - Number of Visits 10   PT Start Time 0845   PT Stop Time 0927   PT Time Calculation (min) 42 min   Activity Tolerance Patient tolerated treatment well      Past Medical History  Diagnosis Date  . PONV (postoperative nausea and vomiting)   . Fibromyalgia   . Migraine headache     a. migraines and cluster;last migraine 3 mon ago  . Joint pain   . Joint swelling   . Scoliosis   . GERD (gastroesophageal reflux disease)     hasn't started taking any meds  . IBS (irritable bowel syndrome)     constipation  . Hx of colonic polyps   . Kidney stones 03/2010  . History of blood transfusion     37yrs ago  . Polycythemia   . CAD (coronary artery disease)     a. 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 05/2013 Lexi CL: EF 77%, old small inferolat scar, no ischemia.  . Myocardial infarction [facility])     Past Surgical History  Procedure Laterality Date  . Abdominal hysterectomy      part. hyst.  . Tubal ligation    . Cholecystectomy  10/28/2002    lap.  . Debridement tennis elbow    . Elbow surgery      golfer's elbow-bilateral  . Knee debridement      right  . Appendectomy      with explor. lap.  . Stapedectomy      right  . Trigger  finger release  08/19/2011    Procedure: RELEASE TRIGGER FINGER/A-1 PULLEY;  Surgeon: Willa Frater III;  Location: [place];  Service: Orthopedics;  Laterality: Right;  right middle finger a-1 pulley release with tenosynovectomy  . Tonsillectomy and adenoidectomy    . Diagnostic laparoscopy    . Dilation and curettage of uterus    . Tympanoplasty    . Colonoscopy    . I&d extremity  02/25/2012    Procedure: IRRIGATION AND DEBRIDEMENT EXTREMITY;  Surgeon: Roseanne Kaufman, MD;  Location: [place];  Service: Orthopedics;  Laterality: Right;  Irrigation and Debridement and Repair Right Thumb Nerve Laceration and Assoiated Structures   . Laparotomy    . Esophagogastroduodenoscopy  09/10/2012    Procedure: ESOPHAGOGASTRODUODENOSCOPY (EGD);  Surgeon: Rogene Houston, MD;  [facility];  Service: Endoscopy;  Laterality: N/A;  730  . Left heart catheterization with coronary angiogram N/A 01/16/2013    Procedure: LEFT HEART CATHETERIZATION WITH CORONARY ANGIOGRAM;  Surgeon: Jolaine Artist, MD;  Location: [facility] CATH LAB;  Service: Cardiovascular;  Laterality: N/A;    There were no vitals filed  for this visit.  Visit Diagnosis:  Leg weakness, bilateral  Hip pain, bilateral  Stiffness of hip joint, unspecified laterality  Decreased activity tolerance  Muscle weakness (generalized)      Subjective Assessment - 11/26/15 0850    Subjective Pt states that she was feeling better until her granddaughter ran into her left hip this morning.    Pertinent History five MI's; HNP, varicose insufficiency,    Limitations Standing;Walking;Lifting   Currently in Pain? Yes   Pain Score 9    Pain Location Hip   Pain Orientation Right;Left   Pain Descriptors / Indicators Burning;Aching   Pain Type Chronic pain   Pain Onset More than a month ago   Pain Frequency Constant   Pain Score 7   Pain Location Back   Pain Orientation Lower   Pain Descriptors / Indicators Aching   Pain  Onset More than a month ago   Pain Frequency Constant                         [facility] Adult PT Treatment/Exercise - 11/26/15 0001    Exercises   Exercises Lumbar   Lumbar Exercises: Stretches   Passive Hamstring Stretch 3 reps;30 seconds   Passive Hamstring Stretch Limitations bilateral (HS 90/90 position); supine   Single Knee to Chest Stretch 30 seconds;3 reps   Single Knee to Chest Stretch Limitations bilateral    Prone on Elbows Stretch 5 reps;10 seconds   Press Ups 1 rep;Other (comment)  10 reps   ITB Stretch 30 seconds;3 reps   ITB Stretch Limitations bilateral passive   Piriformis Stretch 3 reps;30 seconds   Piriformis Stretch Limitations bilateral; supine; manually    Lumbar Exercises: Standing   Other Standing Lumbar Exercises 3 D hip excursion    Lumbar Exercises: Supine   Clam --   Clam Limitations --   Bent Knee Raise 15 reps   Bent Knee Raise Limitations 5 sec hold    Bridge 15 reps;3 seconds   Straight Leg Raises Limitations --   Lumbar Exercises: Sidelying   Clam 5 reps   Lumbar Exercises: Prone   Straight Leg Raise 5 reps   Other Prone Lumbar Exercises glut set x10    Modalities   Modalities Ultrasound   Ultrasound   Ultrasound Location Bilateral    Ultrasound Parameters pulsed 1.3 w/cm 2 for 5 minutes on both hips with dexamethason .4 mg    Ultrasound Goals Pain   Manual Therapy   Manual Therapy --   Manual therapy comments --   Passive ROM --   Neural Stretch --                  PT Short Term Goals - 11/13/15 1008    PT SHORT TERM GOAL #1   Title Pt will improve ROM to within functional limits to be able to pick items off the floor independently as well as allowig pt to  don shoes and socks with ease    Time 3   Period Weeks   Status On-going   PT SHORT TERM GOAL #2   Title Pt will improve LE strenth to 4/5 to allow pt to ambulate 1,000 feet with equal step length in six minutes to feel confident going walking in her  yard.   Time 3   Period Weeks   Status On-going   PT SHORT TERM GOAL #3   Title Pt to be knowledgable about engaging core musculature while  completing household activities to allow pt to be able to mop and vacuum .   Time 3   Period Weeks   Status On-going   PT SHORT TERM GOAL #4   Title Pt pain to decrease to 6/10 to allow pt to be able to sleep for two hours at a time.    Time 3   Period Weeks   Status On-going           PT Long Term Goals - 11/13/15 1009    PT LONG TERM GOAL #1   Title Pt strength to be at least 4+/5 to allow pt to walk on uneven surfaces for 20 minutes at a time    Time 8   Period Weeks   Status On-going   PT LONG TERM GOAL #2   Title PT to be able to demonstrate good body mechanics with lifting to prevent increase stress on back and hips while lifting.    Time 8   Period Weeks   Status On-going   PT LONG TERM GOAL #3   Title Pt to be able to verbalize ways to decrease her pain besides medication to include but not limited to TENS, ice, stretching, postioning to allow minimal medication intake.    Time 8   Period Weeks   Status On-going   PT LONG TERM GOAL #4   Title Pt to be able to ambulate 1200 ft in six minutes to demonstrate improved activity tolerance    Time 8   Period Weeks   Status On-going   PT LONG TERM GOAL #5   Title Patient pain to be no greater than 4/10 to allow pt to be able to sleep three hours straight.    Time 8   Period Weeks   Status On-going               Plan - 11/26/15 0903    Clinical Impression Statement Pt continues to have significant pain.  Added gluteal stengthening exercises.  She is doing her exercies twice a day.  Pt encouraged to obtain a heel lift for her Rt LE as pt had on growing up due to leg length difference.  Leg length checked with 1/4 " difference.    PT Next Visit Plan continue with prone stab and progress to sit to stand and standing stabiliztion.  Inquire if pt obtained heel lift          Problem List Patient Active Problem List   Diagnosis Date Noted  . Trochanteric bursitis of both hips 10/31/2015  . Chest pain at rest 06/17/2015  . Inferior myocardial infarction ([place]) 06/17/2015  . Sprain of interphalangeal joint of left little finger 02/05/2014  . Pain in joint, hand 02/05/2014  . Midsternal chest pain 06/04/2013  . CAD (coronary artery disease)   . Fibromyalgia   . GERD (gastroesophageal reflux disease)   . Migraine headache   . Acute non-STEMI < 8 weeks prior, subsequent admission after initial 01/16/2013  . Radial nerve laceration 03/27/2012  . Lack of coordination 03/27/2012  . Muscle weakness (generalized) 03/27/2012    Rayetta Humphrey, PT CLT [phone] 11/26/2015, 9:33 AM  [place] [address] [place], [name], [address] Phone: [phone]   Fax:  [phone]  Name: SHALYSE KEARLEY MRN: [id number] Date of Birth: Apr 17, 1951

## 2015-12-01 ENCOUNTER — Ambulatory Visit (HOSPITAL_COMMUNITY): Payer: 59

## 2015-12-01 DIAGNOSIS — M6281 Muscle weakness (generalized): Secondary | ICD-10-CM

## 2015-12-01 DIAGNOSIS — M25551 Pain in right hip: Secondary | ICD-10-CM | POA: Diagnosis not present

## 2015-12-01 DIAGNOSIS — M25659 Stiffness of unspecified hip, not elsewhere classified: Secondary | ICD-10-CM | POA: Diagnosis not present

## 2015-12-01 DIAGNOSIS — M25552 Pain in left hip: Secondary | ICD-10-CM | POA: Diagnosis not present

## 2015-12-01 DIAGNOSIS — R6889 Other general symptoms and signs: Secondary | ICD-10-CM

## 2015-12-01 DIAGNOSIS — R29898 Other symptoms and signs involving the musculoskeletal system: Secondary | ICD-10-CM

## 2015-12-01 MED FILL — MELOXICAM 15 MG TABLET: 15 | 30 days supply | Qty: 30 | Fill #1

## 2015-12-01 NOTE — Therapy (Signed)
[name], [name], [address] Phone: [phone]   Fax:  [phone]  Physical Therapy Treatment  Patient Details  Name: Stacey Lawrence MRN: [id number] Date of Birth: 01/15/1951 Referring Provider: Sanjuana Kava   Encounter Date: 12/01/2015      PT End of Session - 12/01/15 0857    Visit Number 7   Number of Visits 16   Date for PT Re-Evaluation 12/09/15   Authorization Type UMR   Authorization - Visit Number 7   Authorization - Number of Visits 10   PT Start Time [id number]   PT Stop Time 0936   PT Time Calculation (min) 47 min   Activity Tolerance Patient tolerated treatment well   Behavior During Therapy [facility] for tasks assessed/performed      Past Medical History  Diagnosis Date  . PONV (postoperative nausea and vomiting)   . Fibromyalgia   . Migraine headache     a. migraines and cluster;last migraine 3 mon ago  . Joint pain   . Joint swelling   . Scoliosis   . GERD (gastroesophageal reflux disease)     hasn't started taking any meds  . IBS (irritable bowel syndrome)     constipation  . Hx of colonic polyps   . Kidney stones 03/2010  . History of blood transfusion     32yrs ago  . Polycythemia   . CAD (coronary artery disease)     a. 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 05/2013 Lexi CL: EF 77%, old small inferolat scar, no ischemia.  . Myocardial infarction [facility])     Past Surgical History  Procedure Laterality Date  . Abdominal hysterectomy      part. hyst.  . Tubal ligation    . Cholecystectomy  10/28/2002    lap.  . Debridement tennis elbow    . Elbow surgery      golfer's elbow-bilateral  . Knee debridement      right  . Appendectomy     with explor. lap.  . Stapedectomy      right  . Trigger finger release  08/19/2011    Procedure: RELEASE TRIGGER FINGER/A-1 PULLEY;  Surgeon: Willa Frater III;  Location: [place];  Service: Orthopedics;  Laterality: Right;  right middle finger a-1 pulley release with tenosynovectomy  . Tonsillectomy and adenoidectomy    . Diagnostic laparoscopy    . Dilation and curettage of uterus    . Tympanoplasty    . Colonoscopy    . I&d extremity  02/25/2012    Procedure: IRRIGATION AND DEBRIDEMENT EXTREMITY;  Surgeon: Roseanne Kaufman, MD;  Location: [place];  Service: Orthopedics;  Laterality: Right;  Irrigation and Debridement and Repair Right Thumb Nerve Laceration and Assoiated Structures   . Laparotomy    . Esophagogastroduodenoscopy  09/10/2012    Procedure: ESOPHAGOGASTRODUODENOSCOPY (EGD);  Surgeon: Rogene Houston, MD;  [facility];  Service: Endoscopy;  Laterality: N/A;  730  . Left heart catheterization with coronary angiogram N/A 01/16/2013    Procedure: LEFT HEART CATHETERIZATION WITH CORONARY ANGIOGRAM;  Surgeon: Jolaine Artist, MD;  [facility] C[facility];  Service: Cardiovascular;  Laterality: N/A;  There were no vitals filed for this visit.  Visit Diagnosis:  Leg weakness, bilateral  Hip pain, bilateral  Stiffness of hip joint, unspecified laterality  Muscle weakness (generalized)  Decreased activity tolerance      Subjective Assessment - 12/01/15 0845    Subjective Pt stated she continues to be limited by high levels of pain, pain scale 9/10 Bil hip today.  Reports hip pain relief following [place] for 1 hour then high pain scale resumes.  Continues to use TENS unit on lower back   Pertinent History five MI's; HNP, varicose insufficiency,    Patient Stated Goals be able to sleep longer, decrease pain,    Currently in Pain? Yes   Pain Score 9    Pain Location Hip   Pain Orientation Right;Left   Pain Descriptors / Indicators  Burning;Aching;Shooting   Pain Type Chronic pain   Pain Onset More than a month ago   Pain Frequency Constant   Aggravating Factors  limiting her ability to ambulate long distances and prolonged standing; bending over to pick up trash out of ban   Pain Relieving Factors ice and rest   Effect of Pain on Daily Activities increases   Pain Score 7   Pain Location Back   Pain Orientation Lower;Right   Pain Descriptors / Indicators Dull;Aching            [facility] Adult PT Treatment/Exercise - 12/01/15 0001    Lumbar Exercises: Stretches   Active Hamstring Stretch 30 seconds;3 reps   Active Hamstring Stretch Limitations BLE supine   Single Knee to Chest Stretch 30 seconds;3 reps   Single Knee to Chest Stretch Limitations bilateral    Prone on Elbows Stretch 5 reps;10 seconds   Press Ups Limitations   Press Ups Limitations 10 pressups   Piriformis Stretch 3 reps;30 seconds   Piriformis Stretch Limitations BLE supine   Lumbar Exercises: Supine   Bridge 15 reps;3 seconds   Lumbar Exercises: Prone   Straight Leg Raise 10 reps   Other Prone Lumbar Exercises glut set x10    Modalities   Modalities Ultrasound   Ultrasound   Ultrasound Location Bil hip   Ultrasound Parameters 3MHz pulsed 1.3w/cm2 x 5' each LE sidelying position with pillow between knees for comfort   Ultrasound Goals Pain              PT Short Term Goals - 11/13/15 1008    PT SHORT TERM GOAL #1   Title Pt will improve ROM to within functional limits to be able to pick items off the floor independently as well as allowig pt to  don shoes and socks with ease    Time 3   Period Weeks   Status On-going   PT SHORT TERM GOAL #2   Title Pt will improve LE strenth to 4/5 to allow pt to ambulate 1,000 feet with equal step length in six minutes to feel confident going walking in her yard.   Time 3   Period Weeks   Status On-going   PT SHORT TERM GOAL #3   Title Pt to be knowledgable about engaging core musculature  while completing household activities to allow pt to be able to mop and vacuum .   Time 3   Period Weeks   Status On-going   PT SHORT TERM GOAL #4   Title Pt pain to decrease to 6/10 to allow pt to be able to sleep for two hours at a time.    Time  3   Period Weeks   Status On-going           PT Long Term Goals - 11/13/15 1009    PT LONG TERM GOAL #1   Title Pt strength to be at least 4+/5 to allow pt to walk on uneven surfaces for 20 minutes at a time    Time 8   Period Weeks   Status On-going   PT LONG TERM GOAL #2   Title PT to be able to demonstrate good body mechanics with lifting to prevent increase stress on back and hips while lifting.    Time 8   Period Weeks   Status On-going   PT LONG TERM GOAL #3   Title Pt to be able to verbalize ways to decrease her pain besides medication to include but not limited to TENS, ice, stretching, postioning to allow minimal medication intake.    Time 8   Period Weeks   Status On-going   PT LONG TERM GOAL #4   Title Pt to be able to ambulate 1200 ft in six minutes to demonstrate improved activity tolerance    Time 8   Period Weeks   Status On-going   PT LONG TERM GOAL #5   Title Patient pain to be no greater than 4/10 to allow pt to be able to sleep three hours straight.    Time 8   Period Weeks   Status On-going               Plan - 12/01/15 1334    Clinical Impression Statement Pt continued to have signifiant pain.  Continued with stretches and gluteal strenghtening exercises.  No progressing toward standing exercises this session due to pain scale 9/10 with increased pain with weight bearing.  Pt did obtain 1/4in heel support for Rt LLD with reports of reduced pain during gait.  Ended session with [place] with reports of pain reduced to 5-6/10 Bil hips.     Pt will benefit from skilled therapeutic intervention in order to improve on the following deficits Decreased activity tolerance;Decreased mobility;Decreased  strength;Difficulty walking;Impaired perceived functional ability;Impaired flexibility;Pain   Rehab Potential Fair   PT Frequency 2x / week   PT Duration 8 weeks   PT Treatment/Interventions Patient/family education;Gait training;Functional mobility training;Therapeutic activities;Therapeutic exercise;Balance training;Ultrasound;Moist Heat;Electrical Stimulation;Cryotherapy;Iontophoresis 4mg /ml Dexamethasone   PT Next Visit Plan continue with prone stab and progress to sit to stand and standing stabiliztion.          Problem List Patient Active Problem List   Diagnosis Date Noted  . Trochanteric bursitis of both hips 10/31/2015  . Chest pain at rest 06/17/2015  . Inferior myocardial infarction ([place]) 06/17/2015  . Sprain of interphalangeal joint of left little finger 02/05/2014  . Pain in joint, hand 02/05/2014  . Midsternal chest pain 06/04/2013  . CAD (coronary artery disease)   . Fibromyalgia   . GERD (gastroesophageal reflux disease)   . Migraine headache   . Acute non-STEMI < 8 weeks prior, subsequent admission after initial 01/16/2013  . Radial nerve laceration 03/27/2012  . Lack of coordination 03/27/2012  . Muscle weakness (generalized) 03/27/2012   Ihor Austin, [place]; [place]  Aldona Lento 12/01/2015, 1:43 PM  [place], [name], [address] Phone: [phone]   Fax:  [phone]  Name: LATAIJA BLIGEN MRN: [id number] Date of Birth: 01/31/1951

## 2015-12-03 ENCOUNTER — Encounter (HOSPITAL_COMMUNITY): Payer: 59 | Admitting: Physical Therapy

## 2015-12-03 ENCOUNTER — Telehealth (HOSPITAL_COMMUNITY): Payer: Self-pay | Admitting: Physical Therapy

## 2015-12-03 NOTE — Telephone Encounter (Signed)
Pt did not show for appointment.  Called patient who stated she had a bad migraine.  Reminded about NS policy and next appt on Tues 8:45am. Teena Irani, PTA/CLT [phone]

## 2015-12-07 DIAGNOSIS — M7071 Other bursitis of hip, right hip: Secondary | ICD-10-CM | POA: Diagnosis not present

## 2015-12-07 DIAGNOSIS — M5442 Lumbago with sciatica, left side: Secondary | ICD-10-CM | POA: Diagnosis not present

## 2015-12-07 DIAGNOSIS — M7072 Other bursitis of hip, left hip: Secondary | ICD-10-CM | POA: Diagnosis not present

## 2015-12-07 MED FILL — CARISOPRODOL 350 MG TABLET: 350 | 20 days supply | Qty: 60 | Fill #0

## 2015-12-08 ENCOUNTER — Telehealth (HOSPITAL_COMMUNITY): Payer: Self-pay | Admitting: Physical Therapy

## 2015-12-08 ENCOUNTER — Encounter (HOSPITAL_COMMUNITY): Payer: 59 | Admitting: Physical Therapy

## 2015-12-08 NOTE — Telephone Encounter (Signed)
Called patient re: no show.  Pt second no show.  Informed pt that she has an appointment on 12/10/15 but all other appointments have been cancelled per policy.  Rayetta Humphrey, [place] CLT [phone]

## 2015-12-09 ENCOUNTER — Telehealth: Payer: Self-pay | Admitting: Orthopaedic Surgery

## 2015-12-09 NOTE — Telephone Encounter (Signed)
12/09/2015 11:12 AM Phone (Incoming)  Patient spoke with you earlier - asking for OV notes for last 2 visits since signing release. Sending documents to her attorney since denied Disability. Saw Dr. Luna Glasgow for 2nd opinion. Please return call    By Bethany back to patient to review and further discuss, as notes had been previously provided.  Left voice mail message to return call and ask for myself.

## 2015-12-10 ENCOUNTER — Other Ambulatory Visit (HOSPITAL_COMMUNITY): Payer: Self-pay | Admitting: Orthopedic Surgery

## 2015-12-10 ENCOUNTER — Encounter (HOSPITAL_COMMUNITY): Payer: Self-pay

## 2015-12-10 ENCOUNTER — Ambulatory Visit (HOSPITAL_COMMUNITY): Payer: 59

## 2015-12-10 DIAGNOSIS — R29898 Other symptoms and signs involving the musculoskeletal system: Secondary | ICD-10-CM | POA: Diagnosis not present

## 2015-12-10 DIAGNOSIS — R6889 Other general symptoms and signs: Secondary | ICD-10-CM | POA: Diagnosis not present

## 2015-12-10 DIAGNOSIS — M25551 Pain in right hip: Secondary | ICD-10-CM

## 2015-12-10 DIAGNOSIS — M6281 Muscle weakness (generalized): Secondary | ICD-10-CM | POA: Diagnosis not present

## 2015-12-10 DIAGNOSIS — M25552 Pain in left hip: Secondary | ICD-10-CM | POA: Diagnosis not present

## 2015-12-10 DIAGNOSIS — M25659 Stiffness of unspecified hip, not elsewhere classified: Secondary | ICD-10-CM

## 2015-12-10 DIAGNOSIS — M7072 Other bursitis of hip, left hip: Secondary | ICD-10-CM

## 2015-12-10 NOTE — Therapy (Signed)
[place], [name], [address] Phone: [phone]   Fax:  [phone]  Physical Therapy Treatment/ 30-day Progress Note  Patient Details  Name: Stacey Lawrence MRN: [id number] Date of Birth: Nov 08, 1950 Referring Provider: Dr. Sanjuana Kava  Encounter Date: 12/10/2015      PT End of Session - 12/10/15 0855    Visit Number 8   Number of Visits 16   Date for PT Re-Evaluation 01/07/16   Authorization Type UMR   Authorization Time Period 11/09/15 to 01/07/16   Authorization - Visit Number 7   Authorization - Number of Visits 10   PT Start Time 0839   PT Stop Time 0928   PT Time Calculation (min) 49 min   Activity Tolerance Patient tolerated treatment well   Behavior During Therapy [facility] for tasks assessed/performed      Past Medical History  Diagnosis Date  . PONV (postoperative nausea and vomiting)   . Fibromyalgia   . Migraine headache     a. migraines and cluster;last migraine 3 mon ago  . Joint pain   . Joint swelling   . Scoliosis   . GERD (gastroesophageal reflux disease)     hasn't started taking any meds  . IBS (irritable bowel syndrome)     constipation  . Hx of colonic polyps   . Kidney stones 03/2010  . History of blood transfusion     63yr ago  . Polycythemia   . CAD (coronary artery disease)     a. 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 05/2013 Lexi CL: EF 77%, old small inferolat scar, no ischemia.  . [facility]    Past Surgical History  Procedure Laterality Date  . Abdominal hysterectomy      part. hyst.  . Tubal ligation    . Cholecystectomy  10/28/2002    lap.  . Debridement tennis elbow    . Elbow surgery      golfer's  elbow-bilateral  . Knee debridement      right  . Appendectomy      with explor. lap.  . Stapedectomy      right  . Trigger finger release  08/19/2011    Procedure: RELEASE TRIGGER FINGER/A-1 PULLEY;  Surgeon: WiWilla FraterII;  [place] Service: Orthopedics;  Laterality: Right;  right middle finger a-1 pulley release with tenosynovectomy  . Tonsillectomy and adenoidectomy    . Diagnostic laparoscopy    . Dilation and curettage of uterus    . Tympanoplasty    . Colonoscopy    . I&d extremity  02/25/2012    Procedure: IRRIGATION AND DEBRIDEMENT EXTREMITY;  Surgeon: WiRoseanne KaufmanMD;  [place]: [place];  Laterality: Right;  Irrigation and Debridement and Repair Right Thumb Nerve Laceration and Assoiated Structures   . Laparotomy    . Esophagogastroduodenoscopy  09/10/2012    Procedure: ESOPHAGOGASTRODUODENOSCOPY (EGD);  Surgeon: NaRogene HoustonMD;  [facility];  Service: Endoscopy;  Laterality: N/A;  730  . Left heart catheterization with coronary angiogram N/A 01/16/2013    Procedure: LEFT HEART CATHETERIZATION WITH CORONARY ANGIOGRAM;  Surgeon: DaShaune Pascalensimhon,  MD;  Location: [place] CATH LAB;  Service: Cardiovascular;  Laterality: N/A;    There were no vitals filed for this visit.  Visit Diagnosis:  Leg weakness, bilateral  Hip pain, bilateral  Stiffness of hip joint, unspecified laterality  Muscle weakness (generalized)      Subjective Assessment - 12/10/15 0846    Subjective Pt reported that her R hip and low back are hurting today with pain rated a 7/10 on a VAS. Pt reported that she has noticed that her strength has improved, but her pain has been unchanged since beginning PT. She reports that therapeutic ultrasound treatment helps to reduce her pain, but only for ~1 hour after the treatment is completed. Pt reports that her MD ordered a MRI to further assess her R hip due to her symptoms not improving. Pt  admitted inconsistent compliance with her current HEP and noted "I'm just busy with my granddaughter".    Pertinent History five MI's; HNP, varicose insufficiency,    Limitations Standing;Walking;Lifting   How long can you sit comfortably? 20-30 minutes then she needs to move due to back and hip pain    How long can you stand comfortably? 20 minutes   How long can you walk comfortably? Pt is able to walk for about 15-20 mintues before she needs to sit down.    Patient Stated Goals be able to sleep longer, decrease pain,    Currently in Pain? Yes   Pain Score 7   B hip pain ranges between a 6-10/10 on a VAS   Pain Location Hip   Pain Orientation Right;Left   Pain Descriptors / Indicators Burning;Aching   Pain Type Chronic pain   Pain Onset More than a month ago   Pain Frequency Constant   Aggravating Factors  limiting her ability to ambulate long distances and prolonged standing   Pain Relieving Factors ice and rest    Effect of Pain on Daily Activities limited with household chores and [place] ambulation    Multiple Pain Sites Yes   Pain Score 7   Pain Location Back  B hip pain ranges between a 6-10/10 on a VAS   Pain Orientation Right;Left   Pain Descriptors / Indicators Dull;Aching   Pain Type Acute pain   Pain Onset More than a month ago   Pain Frequency Constant   Aggravating Factors  limiting her ability to ambulate long distances, forward bending, and prolonged standing   Pain Relieving Factors ice, TENS, and rest    Effect of Pain on Daily Activities limited with household chores and [place] ambulation             [facility] PT Assessment - 12/10/15 0001    Assessment   Medical Diagnosis Bilateral  hip bursitis    Referring Provider Dr. 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    Leisure sewing, woodwork, walking    Cognition   Overall Cognitive Status  Within Functional Limits for tasks assessed   Observation/Other Assessments   Focus on Therapeutic Outcomes (FOTO)  Not completed this vist    ROM / Strength   AROM / PROM / Strength AROM   AROM   Lumbar Flexion [facility]   Lumbar Extension 15   Strength   Strength Assessment Site Hip;Knee;Ankle   Right/Left Hip Right;Left   Right Hip Flexion 3+/5   Right Hip Extension 3/5   Right Hip ABduction 3+/5   Left Hip Flexion 3+/5   Left Hip Extension 3+/5   Right/Left Knee Right;Left   Right Knee Flexion 3+/5   Right Knee Extension 3+/5   Left Knee Flexion 3+/5   Left Knee Extension 5/5   Right/Left Ankle Right;Left   Right Ankle Dorsiflexion 3+/5   Left Ankle Dorsiflexion 4-/5   Flexibility   Soft Tissue Assessment /Muscle Length yes   Hamstrings moderate restriction    Piriformis --   Palpation   Palpation comment tight paraspinal mm bilateral    6 Minute Walk- Baseline   6 Minute Walk- Baseline --  not formally assessed                      [facility] Adult PT Treatment/Exercise - 12/10/15 0001    Exercises   Exercises Lumbar   Lumbar Exercises: Stretches   Active Hamstring Stretch 30 seconds;3 reps   Active Hamstring Stretch Limitations BLE supine   Single Knee to Chest Stretch 30 seconds;3 reps   Single Knee to Chest Stretch Limitations bilateral    Prone on Elbows Stretch Other (comment)  5 minutes    Piriformis Stretch 3 reps;30 seconds   Piriformis Stretch Limitations BLE supine   Lumbar Exercises: Supine   Ab Set 10 reps   Clam 15 reps   Clam Limitations B hip IR/ER isometrics  in hooklying position    Lumbar Exercises: Sidelying   Clam 10 reps;Other (comment)   Clam Limitations bilateral    Lumbar Exercises: Prone   Straight Leg Raise 10 reps   Straight Leg Raises Limitations bilateral   Other Prone Lumbar Exercises glut set x10    Knee/Hip Exercises: Aerobic   Recumbent Bike 8 minutes, level 2-3   BP prior to using bike= 122/80 mmhg of L UE    Manual Therapy   Manual therapy comments completed at the end of PT tx seperate from other interventions    Passive ROM B hip PROM into IR/ER/flex/ext x 10 reps                 PT Education - 12/10/15 0852    Education provided Yes   Education Details Educated pt on current HEP including correct stretching/strengtehning parameters, core bracing with functional mobiltiy activities, and avoidance with heavy lifting and forward bending    Person(s) Educated Patient   Methods Explanation;Demonstration;Verbal cues   Comprehension Verbalized understanding          PT Short Term Goals - 12/10/15 0840    PT SHORT TERM GOAL #1   Title Pt will improve ROM to within functional limits to be able to pick items off the floor independently as well as allowig pt to  don shoes and socks with ease    Baseline Lumbar flexion AROM is WFL. Pt is limited by HS flexibility    Time 3   Period Weeks   Status Achieved   PT SHORT TERM GOAL #2   Title Pt will improve LE strenth to 4/5 to  allow pt to ambulate 1,000 feet with equal step length in six minutes to feel confident going walking in her yard.   Baseline Refer to MMT grades    Time 3   Period Weeks   Status On-going   PT SHORT TERM GOAL #3   Title Pt to be knowledgable about engaging core musculature while completing household activities to allow pt to be able to mop and vacuum .   Baseline Requires occasional cues when completing ther ex during PT tx    Time 3   Period Weeks   Status On-going   PT SHORT TERM GOAL #4   Title Pt pain to decrease to 6/10 to allow pt  to be able to sleep for two hours at a time.    Baseline Pt is able to occasionally sleep for 2 hours at a time with R hip pain rangeing between a 6-10/10 on a VAS    Time 3   Period Weeks   Status Partially Met           PT Long Term Goals - 12/10/15 0814    PT LONG TERM GOAL #1   Title Pt strength to be at least 4+/5 to allow pt to walk on uneven surfaces for 20 minutes at a time    Time 8   Period Weeks   Status On-going   PT LONG TERM GOAL #2   Title PT to be able to demonstrate good body mechanics with lifting to prevent increase stress on back and hips while lifting.    Baseline Pt requires reminders to keep core braced and not to bend forward    Time 8   Period Weeks   Status On-going   PT LONG TERM GOAL #3   Title Pt to be able to verbalize ways to decrease her pain besides medication to include but not limited to TENS, ice, stretching, postioning to allow minimal medication intake.    Time 8   Period Weeks   Status Achieved   PT LONG TERM GOAL #4   Title Pt to be able to ambulate 1200 ft in six minutes to demonstrate improved activity tolerance    Baseline Not assessed this visit   Time 8   Period Weeks   PT LONG TERM GOAL #5   Title Patient pain to be no greater than 4/10 to allow pt to be able to sleep three hours straight.    Baseline R hip pain ranges between a 6-10/10 on a VAS   Time 8   Period Weeks   Status On-going               Plan - 12/10/15 4818    Clinical Impression Statement [name] is a 65 yo female who has been seen for a total of 8 PT visits with initial complaints of lateral B hip pain ( R worse than L) that is associated to hip bursitis. The pt has demo minimal to no improvement since beginning with PT. Her pain levels continue to be consistently high and she presents with significant weakness of B hips. Her B hip pain and low back pain levels range between a 6-10/10 on a VAS. The pt continues to present with palpable tenderness over  the R ITB and over the greater trochanter.  Pt demo poor understanding with HEP frequency and parameters and was thoroughly educated on stretching parameters ( 3 sets of 30 sec hold) and strengthening parameters to tolerance. Pt verbalized understanding with teach  back method. She will continue to require education and instructions in regards to HEP in order to improve compliance and understanding. Pt has made minor progress towards stated PT goals. Goals were reassessed with no additional goals added this visit. The pt continues to present with impairments including B hip weakness, abdominal/erector spinae weakness, R quad/HS weakness, impaired HS/piriformis/ITB flexibility, and B hip pain. The pt would benefit from continued skilled PT with focus on LE stretching and strengthening of B hips and core musculature in order to improve performance with daily activities and ambulation. Instructed pt on hip SLR 4-way in standing with instructions to complete 1 set of 10 reps, 3x/day, as tolerated, as part of her HEP. Will plan to increase reps/sets once tolerance improves. Pt is in agreement with continued skilled PT. Continue with current POC.    Pt will benefit from skilled therapeutic intervention in order to improve on the following deficits Decreased activity tolerance;Decreased mobility;Decreased strength;Difficulty walking;Impaired perceived functional ability;Impaired flexibility;Pain   Rehab Potential Fair   PT Frequency 2x / week   PT Duration 8 weeks   PT Treatment/Interventions Patient/family education;Gait training;Functional mobility training;Therapeutic activities;Therapeutic exercise;Balance training;Ultrasound;Moist Heat;Electrical Stimulation;Cryotherapy;Iontophoresis 63m/ml Dexamethasone   PT Next Visit Plan continue with prone stab and progress to sit to stand and standing stabiliztion.     PT Home Exercise Plan Update HEP with addition of seated core stabilization ther ex   Consulted and  Agree with Plan of Care Patient        Problem List Patient Active Problem List   Diagnosis Date Noted  . Trochanteric bursitis of both hips 10/31/2015  . Chest pain at rest 06/17/2015  . [place] 06/17/[place]  . Sprain of interphalangeal joint of left little finger 02/05/2014  . Pain in joint, hand 02/05/2014  . Midsternal chest pain 06/04/2013  . CAD (coronary artery disease)   . Fibromyalgia   . GERD (gastroesophageal reflux disease)   . Migraine headache   . Acute non-STEMI < 8 weeks prior, subsequent admission after initial 01/16/2013  . Radial nerve laceration 03/27/2012  . Lack of coordination 03/27/2012  . Muscle weakness (generalized) 03/27/2012    GGaren Lah PT, DPT  12/10/2015, 12:46 PM  [place] [address]Phone: [phone]  Fax:  [phone] Name: FNAKAYLA RORABAUGHMRN: [id number] of Birth: 4October 22, 1952

## 2015-12-11 ENCOUNTER — Ambulatory Visit (HOSPITAL_COMMUNITY)
Admission: RE | Admit: 2015-12-11 | Discharge: 2015-12-11 | Disposition: A | Discharge status: Home / Self Care | Payer: 59 | Source: Ambulatory Visit | Attending: Orthopedic Surgery | Admitting: Orthopedic Surgery

## 2015-12-11 DIAGNOSIS — M25552 Pain in left hip: Secondary | ICD-10-CM | POA: Diagnosis not present

## 2015-12-11 DIAGNOSIS — M7072 Other bursitis of hip, left hip: Secondary | ICD-10-CM | POA: Diagnosis not present

## 2015-12-11 DIAGNOSIS — M25551 Pain in right hip: Secondary | ICD-10-CM | POA: Diagnosis not present

## 2015-12-11 DIAGNOSIS — K5730 Diverticulosis of large intestine without perforation or abscess without bleeding: Secondary | ICD-10-CM | POA: Insufficient documentation

## 2015-12-15 ENCOUNTER — Ambulatory Visit (HOSPITAL_COMMUNITY): Payer: 59 | Admitting: Physical Therapy

## 2015-12-15 ENCOUNTER — Encounter (HOSPITAL_COMMUNITY): Payer: 59

## 2015-12-15 DIAGNOSIS — M25552 Pain in left hip: Secondary | ICD-10-CM | POA: Diagnosis not present

## 2015-12-15 DIAGNOSIS — M25659 Stiffness of unspecified hip, not elsewhere classified: Secondary | ICD-10-CM | POA: Diagnosis not present

## 2015-12-15 DIAGNOSIS — R6889 Other general symptoms and signs: Secondary | ICD-10-CM

## 2015-12-15 DIAGNOSIS — M6281 Muscle weakness (generalized): Secondary | ICD-10-CM

## 2015-12-15 DIAGNOSIS — M25551 Pain in right hip: Secondary | ICD-10-CM | POA: Diagnosis not present

## 2015-12-15 DIAGNOSIS — R29898 Other symptoms and signs involving the musculoskeletal system: Secondary | ICD-10-CM

## 2015-12-15 NOTE — Telephone Encounter (Signed)
Spoke with patient 12/10/15.  Records picked up here 12/15/15.

## 2015-12-15 NOTE — Therapy (Addendum)
[place], [name], [address] Phone: [phone]   Fax:  [phone]  Physical Therapy Treatment  Patient Details  Name: Stacey Lawrence MRN: [id number] Date of Birth: 10-Oct-1950 Referring Provider: Dr. Sanjuana Kava  Encounter Date: 12/15/2015      PT End of Session - 12/15/15 1548    Visit Number 9   Number of Visits 16   Date for PT Re-Evaluation 01/07/16   Authorization Type UMR   Authorization Time Period 11/09/15 to 01/07/16   Authorization - Visit Number 9   Authorization - Number of Visits 16   PT Start Time 1520   PT Stop Time 1600   PT Time Calculation (min) 40 min   Equipment Utilized During Treatment Gait belt   Activity Tolerance Patient tolerated treatment well   Behavior During Therapy [facility] for tasks assessed/performed      Past Medical History  Diagnosis Date  . PONV (postoperative nausea and vomiting)   . Fibromyalgia   . Migraine headache     a. migraines and cluster;last migraine 3 mon ago  . Joint pain   . Joint swelling   . Scoliosis   . GERD (gastroesophageal reflux disease)     hasn't started taking any meds  . IBS (irritable bowel syndrome)     constipation  . Hx of colonic polyps   . Kidney stones 03/2010  . History of blood transfusion     36yr ago  . Polycythemia   . CAD (coronary artery disease)     a. 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 05/2013 Lexi CL: EF 77%, old small inferolat scar, no ischemia.  . [facility]    Past Surgical History  Procedure Laterality Date  . Abdominal hysterectomy      part. hyst.  . Tubal ligation    . Cholecystectomy  10/28/2002    lap.  . Debridement tennis elbow    .  Elbow surgery      golfer's elbow-bilateral  . Knee debridement      right  . Appendectomy      with explor. lap.  . Stapedectomy      right  . Trigger finger release  08/19/2011    Procedure: RELEASE TRIGGER FINGER/A-1 PULLEY;  Surgeon: WiWilla FraterII;  [place]: [place];  Laterality: Right;  right middle finger a-1 pulley release with tenosynovectomy  . Tonsillectomy and adenoidectomy    . Diagnostic laparoscopy    . Dilation and curettage of uterus    . Tympanoplasty    . Colonoscopy    . I&d extremity  02/25/2012    Procedure: IRRIGATION AND DEBRIDEMENT EXTREMITY;  Surgeon: WiRoseanne KaufmanMD;  [place] Service: [place]opedics;  Laterality: Right;  Irrigation and Debridement and Repair Right Thumb Nerve Laceration and Assoiated Structures   . Laparotomy    . Esophagogastroduodenoscopy  09/10/2012    Procedure: ESOPHAGOGASTRODUODENOSCOPY (EGD);  Surgeon: NaRogene HoustonMD;  [facility];  Service: Endoscopy;  Laterality: N/A;  730  . Left heart catheterization with coronary angiogram N/A 01/16/2013    Procedure: LEFT HEART CATHETERIZATION WITH CORONARY ANGIOGRAM;  Surgeon: Jolaine Artist, MD;  Location: [facility] CATH LAB;  Service: Cardiovascular;  Laterality: N/A;    There were no vitals filed for this visit.  Visit Diagnosis:  Leg weakness, bilateral  Hip pain, bilateral  Stiffness of hip joint, unspecified laterality  Muscle weakness (generalized)  Decreased activity tolerance      Subjective Assessment - 12/15/15 1522    Subjective Pt states that she had another MRI  due to her hips increasing in pain.  They are still sore.     Currently in Pain? Yes   Pain Score 10-Worst pain ever   Pain Location Hip   Pain Orientation Right;Left   Pain Descriptors / Indicators Burning   Pain Type Chronic pain   Pain Onset More than a month ago   Pain Frequency Constant   Aggravating Factors  limiting her activity     Pain Relieving Factors ice and rest                          [facility] Adult PT Treatment/Exercise - 12/15/15 0001    Exercises   Exercises Lumbar   Lumbar Exercises: Stretches   Active Hamstring Stretch 30 seconds;3 reps   Active Hamstring Stretch Limitations BLE supine   Single Knee to Chest Stretch 30 seconds;3 reps   Single Knee to Chest Stretch Limitations bilateral    Pelvic Tilt 5 reps   Standing Extension 5 reps   Prone on Elbows Stretch Other (comment)  5 minutes    Press Ups 5 reps   ITB Stretch 3 reps;30 seconds   Piriformis Stretch 3 reps;30 seconds   Piriformis Stretch Limitations BLE supine   Lumbar Exercises: Standing   Heel Raises 10 reps   Functional Squats 10 reps   Other Standing Lumbar Exercises 3 D hip excursion    Lumbar Exercises: Supine   Ab Set 10 reps   Clam --   Clam Limitations --   Straight Leg Raise 10 reps   Lumbar Exercises: Sidelying   Clam 10 reps;Other (comment)   Clam Limitations bilateral    Hip Abduction 10 reps   Lumbar Exercises: Prone   Straight Leg Raise 10 reps   Straight Leg Raises Limitations bilateral   Other Prone Lumbar Exercises glut set x10    Knee/Hip Exercises: Aerobic   Recumbent Bike --   Manual Therapy   Manual therapy comments --   Passive ROM --                  PT Short Term Goals - 12/10/15 0840    PT SHORT TERM GOAL #1   Title Pt will improve ROM to within functional limits to be able to pick items off the floor independently as well as allowig pt to  don shoes and socks with ease    Baseline Lumbar flexion AROM is WFL. Pt is limited by HS flexibility    Time 3   Period Weeks   Status Achieved   PT SHORT TERM GOAL #2   Title Pt will improve LE strenth to 4/5 to allow pt to ambulate 1,000 feet with equal step length in six minutes to feel confident going walking in her yard.   Baseline Refer to MMT grades    Time 3   Period Weeks   Status On-going   PT SHORT TERM GOAL #3   Title  Pt to be knowledgable about engaging core musculature while completing household activities to allow pt  to be able to mop and vacuum .   Baseline Requires occasional cues when completing ther ex during PT tx    Time 3   Period Weeks   Status On-going   PT SHORT TERM GOAL #4   Title Pt pain to decrease to 6/10 to allow pt to be able to sleep for two hours at a time.    Baseline Pt is able to occasionally sleep for 2 hours at a time with R hip pain rangeing between a 6-10/10 on a VAS    Time 3   Period Weeks   Status Partially Met           PT Long Term Goals - 12/10/15 9811    PT LONG TERM GOAL #1   Title Pt strength to be at least 4+/5 to allow pt to walk on uneven surfaces for 20 minutes at a time    Time 8   Period Weeks   Status On-going   PT LONG TERM GOAL #2   Title PT to be able to demonstrate good body mechanics with lifting to prevent increase stress on back and hips while lifting.    Baseline Pt requires reminders to keep core braced and not to bend forward    Time 8   Period Weeks   Status On-going   PT LONG TERM GOAL #3   Title Pt to be able to verbalize ways to decrease her pain besides medication to include but not limited to TENS, ice, stretching, postioning to allow minimal medication intake.    Time 8   Period Weeks   Status Achieved   PT LONG TERM GOAL #4   Title Pt to be able to ambulate 1200 ft in six minutes to demonstrate improved activity tolerance    Baseline Not assessed this visit   Time 8   Period Weeks   PT LONG TERM GOAL #5   Title Patient pain to be no greater than 4/10 to allow pt to be able to sleep three hours straight.    Baseline R hip pain ranges between a 6-10/10 on a VAS   Time 8   Period Weeks   Status On-going               Plan - 12/15/15 1549    Clinical Impression Statement [name] latest MRI is (-).  She continues to have weakness but pain with exercises prohibits advancement of exercise.  She did not benefit from  the ultrasound treatments with dexamethasone.  Added functional squats, pelvic tilt exercises. We will continue to progress pt as able through a strengththening exercise program    Pt will benefit from skilled therapeutic intervention in order to improve on the following deficits Decreased activity tolerance;Decreased mobility;Decreased strength;Difficulty walking;Impaired perceived functional ability;Impaired flexibility;Pain   PT Frequency 2x / week   PT Duration 8 weeks   PT Next Visit Plan begin sit to stand and steps         Problem List Patient Active Problem List   Diagnosis Date Noted  . Trochanteric bursitis of both hips 10/31/2015  . Chest pain at rest 06/17/2015  . Inferior myocardial infarction ([place]) 06/17/2015  . Sprain of interphalangeal joint of left little finger 02/05/2014  . Pain in joint, hand 02/05/2014  . Midsternal chest pain 06/04/2013  . CAD (coronary artery disease)   . Fibromyalgia   . GERD (gastroesophageal reflux disease)   . Migraine headache   . Acute non-STEMI < 8 weeks prior,  subsequent admission after initial 01/16/2013  . Radial nerve laceration 03/27/2012  . Lack of coordination 03/27/2012  . Muscle weakness (generalized) 03/27/2012   Rayetta Humphrey, PT CLT [phone] 12/15/2015, 3:59 PM  [place] [address] [place], [name], [address] Phone: [phone]   Fax:  [phone]  Name: Stacey Lawrence MRN: [id number] Date of Birth: October 24, 1950  PHYSICAL THERAPY DISCHARGE SUMMARY  Visits from Start of Care: 9  Current functional level related to goals / functional outcomes: As above    Remaining deficits: Pain limiting ADL's   Education / Equipment: HEP Plan: Patient agrees to discharge.  Patient goals were partially met. Patient is being discharged due to not returning since the last visit.  ?????        Rayetta Humphrey, [place] CLT [phone]

## 2015-12-17 ENCOUNTER — Encounter (HOSPITAL_COMMUNITY): Payer: 59

## 2015-12-18 ENCOUNTER — Other Ambulatory Visit (HOSPITAL_COMMUNITY): Payer: 59

## 2015-12-21 DIAGNOSIS — M7061 Trochanteric bursitis, right hip: Secondary | ICD-10-CM | POA: Diagnosis not present

## 2015-12-21 DIAGNOSIS — M5442 Lumbago with sciatica, left side: Secondary | ICD-10-CM | POA: Diagnosis not present

## 2015-12-22 ENCOUNTER — Encounter (HOSPITAL_COMMUNITY): Payer: 59

## 2015-12-23 MED FILL — ALPRAZolam 0.5 MG TABS: 0.5 | 30 days supply | Qty: 60 | Fill #0

## 2015-12-24 ENCOUNTER — Encounter (HOSPITAL_COMMUNITY): Payer: 59 | Admitting: Physical Therapy

## 2015-12-29 ENCOUNTER — Ambulatory Visit (HOSPITAL_COMMUNITY): Payer: 59 | Admitting: Physical Therapy

## 2015-12-29 ENCOUNTER — Ambulatory Visit (HOSPITAL_COMMUNITY): Payer: 59 | Attending: Orthopaedic Surgery | Admitting: Physical Therapy

## 2015-12-29 DIAGNOSIS — M545 Low back pain: Secondary | ICD-10-CM | POA: Diagnosis not present

## 2015-12-29 DIAGNOSIS — M25551 Pain in right hip: Secondary | ICD-10-CM | POA: Diagnosis not present

## 2015-12-29 DIAGNOSIS — M25552 Pain in left hip: Secondary | ICD-10-CM | POA: Diagnosis not present

## 2015-12-30 ENCOUNTER — Other Ambulatory Visit (HOSPITAL_COMMUNITY): Payer: Self-pay | Admitting: Geriatric Medicine

## 2015-12-30 DIAGNOSIS — Z1231 Encounter for screening mammogram for malignant neoplasm of breast: Secondary | ICD-10-CM

## 2016-01-01 ENCOUNTER — Telehealth (HOSPITAL_COMMUNITY): Payer: Self-pay | Admitting: Physical Therapy

## 2016-01-01 DIAGNOSIS — M545 Low back pain: Secondary | ICD-10-CM | POA: Diagnosis not present

## 2016-01-01 DIAGNOSIS — M25551 Pain in right hip: Secondary | ICD-10-CM | POA: Diagnosis not present

## 2016-01-01 DIAGNOSIS — M25552 Pain in left hip: Secondary | ICD-10-CM | POA: Diagnosis not present

## 2016-01-01 NOTE — Telephone Encounter (Signed)
Reminded pt of missed apt on Tues 12/30/15. Notified her that it was her last scheduled apt with [place] and that she needs to call to reschedule her apts.    2:15 PM,01/01/2016 Elly Modena PT, DPT [facility] [phone]

## 2016-01-04 ENCOUNTER — Ambulatory Visit (HOSPITAL_COMMUNITY)
Admission: RE | Admit: 2016-01-04 | Discharge: 2016-01-04 | Disposition: A | Discharge status: Home / Self Care | Payer: 59 | Source: Ambulatory Visit | Attending: Geriatric Medicine | Admitting: Geriatric Medicine

## 2016-01-04 ENCOUNTER — Ambulatory Visit (HOSPITAL_COMMUNITY): Payer: 59

## 2016-01-04 DIAGNOSIS — Z1231 Encounter for screening mammogram for malignant neoplasm of breast: Secondary | ICD-10-CM | POA: Insufficient documentation

## 2016-01-05 DIAGNOSIS — Z6823 Body mass index (BMI) 23.0-23.9, adult: Secondary | ICD-10-CM | POA: Diagnosis not present

## 2016-01-05 DIAGNOSIS — Z01419 Encounter for gynecological examination (general) (routine) without abnormal findings: Secondary | ICD-10-CM | POA: Diagnosis not present

## 2016-01-07 DIAGNOSIS — M545 Low back pain: Secondary | ICD-10-CM | POA: Diagnosis not present

## 2016-01-07 DIAGNOSIS — M25551 Pain in right hip: Secondary | ICD-10-CM | POA: Diagnosis not present

## 2016-01-07 DIAGNOSIS — M25552 Pain in left hip: Secondary | ICD-10-CM | POA: Diagnosis not present

## 2016-01-08 ENCOUNTER — Encounter: Payer: Self-pay | Admitting: Cardiovascular Disease

## 2016-01-08 ENCOUNTER — Ambulatory Visit (INDEPENDENT_AMBULATORY_CARE_PROVIDER_SITE_OTHER): Payer: 59 | Admitting: Cardiovascular Disease

## 2016-01-08 VITALS — BP 122/64 | HR 75 | Ht 70.0 in | Wt 152.0 lb

## 2016-01-08 DIAGNOSIS — E785 Hyperlipidemia, unspecified: Secondary | ICD-10-CM

## 2016-01-08 DIAGNOSIS — I83893 Varicose veins of bilateral lower extremities with other complications: Secondary | ICD-10-CM | POA: Diagnosis not present

## 2016-01-08 DIAGNOSIS — R079 Chest pain, unspecified: Secondary | ICD-10-CM | POA: Diagnosis not present

## 2016-01-08 DIAGNOSIS — I25118 Atherosclerotic heart disease of native coronary artery with other forms of angina pectoris: Secondary | ICD-10-CM

## 2016-01-08 MED ORDER — RANOLAZINE ER 1000 MG PO TB12: 1000.0000 mg | ORAL_TABLET | Freq: Two times a day (BID) | ORAL | Status: DC

## 2016-01-08 MED FILL — RANEXA ER 1,000 MG TABLET: 1000 | 90 days supply | Qty: 180 | Fill #0

## 2016-01-08 NOTE — Patient Instructions (Signed)
Your physician wants you to follow-up in: 1 year with Dr Virgina Jock will receive a reminder letter in the mail two months in advance. If you don't receive a letter, please call our office to schedule the follow-up appointment.     INCREASE Ranexa to 1,000 mg twice a day      Thank you for choosing [facility] !

## 2016-01-08 NOTE — Progress Notes (Signed)
Patient ID: Stacey Lawrence   DOB: 1951/04/08, 65 y.o.   MRN: [id number]      SUBJECTIVE: Stacey Lawrence presents for follow-up for varicose veins, chest pain, and coronary disease. She also has a history of migraine headaches. She was bothered by headaches and nausea with isosorbide dinitrate and is taking Ranexa 1000 mg bid.  She also has a history of Raynaud's phenomenon as well, with cold nose, hands, and feet.   She says she is soon to undergo hip and lumbar spine surgery. While she wears compression stockings she feels they do not help. She sleeps 2 hours per night. She is riddled with anxiety. She has chronic chest pain and uses anywhere from 2-6 nitroglycerin tablets per week. She also has thoracic spine arthritis.   Review of Systems: As per "subjective", otherwise negative.  Allergies  Allergen Reactions  . Dilaudid [Hydromorphone Hcl] Other (See Comments)    Resp. arrest  . Codeine Nausea And Vomiting and Rash  . Morphine And Related Nausea And Vomiting    Current Outpatient Prescriptions  Medication Sig Dispense Refill  . ALPRAZolam (XANAX) 0.25 MG tablet Take 0.25 mg by mouth at bedtime as needed for anxiety. Takes 1/2 tab qhs prn sleep    . Ascorbic Acid (VITAMIN C PO) Take 2 tablets by mouth daily.    Marland Kitchen aspirin EC 81 MG EC tablet Take 1 tablet (81 mg total) by mouth daily.    Marland Kitchen atorvastatin (LIPITOR) 40 MG tablet Take 1 tablet (40 mg total) by mouth daily. 90 tablet 4  . Calcium Citrate-Vitamin D (CALCITRATE/VITAMIN D PO) Take 1 tablet by mouth daily.    . celecoxib (CELEBREX) 200 MG capsule Reported on 10/29/2015  4  . ibuprofen (ADVIL,MOTRIN) 200 MG tablet Take 400 mg by mouth every 6 (six) hours as needed for pain. Reported on 10/29/2015    . indomethacin (INDOCIN) 25 MG capsule   2  . meloxicam (MOBIC) 15 MG tablet Take 15 mg by mouth daily.    Marland Kitchen NITROSTAT 0.4 MG SL tablet PLACE 1 TABLET UNDER THE TONGUE EVERY 5 MINUTES AS NEEDED FOR CHEST PAIN 100 tablet 1  .  pantoprazole (PROTONIX) 40 MG tablet TAKE 1 TABLET BY MOUTH ONCE DAILY 30 tablet 5  . ranolazine (RANEXA) 500 MG 12 hr tablet Take 1 tablet (500 mg total) by mouth 2 (two) times daily. 180 tablet 3  . traMADol (ULTRAM) 50 MG tablet Take by mouth every 6 (six) hours as needed.     No current facility-administered medications for this visit.    Past Medical History  Diagnosis Date  . PONV (postoperative nausea and vomiting)   . Fibromyalgia   . Migraine headache     a. migraines and cluster;last migraine 3 mon ago  . Joint pain   . Joint swelling   . Scoliosis   . GERD (gastroesophageal reflux disease)     hasn't started taking any meds  . IBS (irritable bowel syndrome)     constipation  . Hx of colonic polyps   . Kidney stones 03/2010  . History of blood transfusion     39yrs ago  . Polycythemia   . CAD (coronary artery disease)     a. 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 05/2013 Lexi CL: EF 77%, old small inferolat scar, no ischemia.  . Myocardial infarction [facility])     Past Surgical History  Procedure Laterality Date  . Abdominal hysterectomy      part. hyst.  . Tubal ligation    . Cholecystectomy  10/28/2002    lap.  . Debridement tennis elbow    . Elbow surgery      golfer's elbow-bilateral  . Knee debridement      right  . Appendectomy      with explor. lap.  . Stapedectomy      right  . Trigger finger release  08/19/2011    Procedure: RELEASE TRIGGER FINGER/A-1 PULLEY;  Surgeon: Willa Frater III;  Location: [place];  Service: Orthopedics;  Laterality: Right;  right middle finger a-1 pulley release with tenosynovectomy  . Tonsillectomy and adenoidectomy    . Diagnostic laparoscopy    . Dilation and  curettage of uterus    . Tympanoplasty    . Colonoscopy    . I&d extremity  02/25/2012    Procedure: IRRIGATION AND DEBRIDEMENT EXTREMITY;  Surgeon: Roseanne Kaufman, MD;  Location: [place];  Service: Orthopedics;  Laterality: Right;  Irrigation and Debridement and Repair Right Thumb Nerve Laceration and Assoiated Structures   . Laparotomy    . Esophagogastroduodenoscopy  09/10/2012    Procedure: ESOPHAGOGASTRODUODENOSCOPY (EGD);  Surgeon: Rogene Houston, MD;  [facility];  Service: Endoscopy;  Laterality: N/A;  730  . Left heart catheterization with coronary angiogram N/A 01/16/2013    Procedure: LEFT HEART CATHETERIZATION WITH CORONARY ANGIOGRAM;  Surgeon: Jolaine Artist, MD;  Location: [facility] C[facility] LAB;  Service: Cardiovascular;  Laterality: N/A;    Social History   Social History  . Marital Status: Married    Spouse Name: N/A  . Number of Children: N/A  . Years of Education: N/A   Occupational History  . Not on file.   Social History Main Topics  . Smoking status: Never Smoker   . Smokeless tobacco: Never Used  . Alcohol Use: 0.0 oz/week    0 Standard drinks or equivalent per week     Comment: rarely  . Drug Use: No  . Sexual Activity: Yes    Birth Control/ Protection: Surgical   Other Topics Concern  . Not on file   Social History Narrative   Lives in [place] with husband.  Grown children.  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     Filed Vitals:   01/08/16 1001  BP: 122/64  Pulse: 75  Height: 5\' 10"  (1.778 m)  Weight: 152 lb (68.947 kg)  SpO2: 98%    PHYSICAL EXAM General: NAD HEENT: Normal. Neck: No JVD, no thyromegaly. Lungs: Clear to auscultation bilaterally with normal respiratory effort. CV: Nondisplaced PMI.  Regular rate and rhythm, normal S1/S2, no S3/S4, no murmur. No pretibial or periankle edema.  Compression stockings noted. No carotid bruit.   Abdomen: Soft, nontender, no distention.  Neurologic: Alert and oriented.    Psych: Normal affect. Skin: Normal. Musculoskeletal: No gross deformities.  ECG: Most recent ECG reviewed.      ASSESSMENT AND PLAN: 1. CAD: Frequent chest pains, non-exertional, doubt cardiac in etiology. However, will increase Ranexa to 1000 mg bid. She is not on beta blockers due to prior issues with hypotension. Continue ASA and Lipitor 40 mg.   2. Venous varicosities: Saw vascular surgery last year.  Says compression stockings have not provided much relief, and previously offered sclerotherapy for symptoms.   3. Hyperlipidemia: TC 204, TG 70, HDL 81, LDL 109. For the time being, continue Lipitor 40 mg.  Dispo: fu 1 year.  Kate Sable, M.D., F.A.C.C.

## 2016-01-14 MED FILL — CARISOPRODOL 350 MG TABLET: 350 | 20 days supply | Qty: 60 | Fill #0

## 2016-01-25 DIAGNOSIS — M7062 Trochanteric bursitis, left hip: Secondary | ICD-10-CM | POA: Diagnosis not present

## 2016-01-25 DIAGNOSIS — M7061 Trochanteric bursitis, right hip: Secondary | ICD-10-CM | POA: Diagnosis not present

## 2016-01-27 ENCOUNTER — Other Ambulatory Visit: Payer: Self-pay | Admitting: Orthopaedic Surgery

## 2016-01-28 ENCOUNTER — Other Ambulatory Visit: Payer: Self-pay | Admitting: Physician Assistant

## 2016-01-29 NOTE — Pre-Procedure Instructions (Signed)
    Stacey Lawrence  01/29/2016      [facility] - [place] [place] [address] Phone: [phone] Fax: [phone]  [place], [name] - [address] [name] [address] Phone: [phone] Fax: [phone]    Your procedure is scheduled on Thursday, May 18th, 2017.  Report to [facility] Admitting at 8:30 A.M.   Call this number if you have problems the morning of surgery:  [phone]   Remember:  Do not eat food or drink liquids after midnight.   Take these medicines the morning of surgery with A SIP OF WATER: Acetaminophen (Tylenol) if needed, Alprazolam (Xanax), Pantoprazole (Protonix), Promethazine (Phenergan) if needed, Ranolazine (Ranexa), Nitrostat if needed.  Stop taking: Aspirin, NSAIDS, Aleve, naproxen, Ibuprofen, Advil, Motrin, BC's, Goody's, Fish oil, all herbal medications, and all vitamins.    Do not wear jewelry, make-up or nail polish.  Do not wear lotions, powders, or perfumes.    Do not shave 48 hours prior to surgery.    Do not bring valuables to the hospital.   [facility] is not responsible for any belongings or valuables.  Contacts, dentures or bridgework may not be worn into surgery.  Leave your suitcase in the car.  After surgery it may be brought to your room.  For patients admitted to the hospital, discharge time will be determined by your treatment team.  Patients discharged the day of surgery will not be allowed to drive home.   Special instructions:  See attached.   Please read over the following fact sheets that you were given. Pain Booklet, Coughing and Deep Breathing and Surgical Site Infection Prevention

## 2016-02-01 ENCOUNTER — Encounter (HOSPITAL_COMMUNITY): Payer: Self-pay

## 2016-02-01 ENCOUNTER — Encounter (HOSPITAL_COMMUNITY)
Admission: RE | Admit: 2016-02-01 | Discharge: 2016-02-01 | Disposition: A | Discharge status: Home / Self Care | Payer: 59 | Source: Ambulatory Visit | Attending: Orthopaedic Surgery | Admitting: Orthopaedic Surgery

## 2016-02-01 DIAGNOSIS — I2510 Atherosclerotic heart disease of native coronary artery without angina pectoris: Secondary | ICD-10-CM | POA: Diagnosis not present

## 2016-02-01 DIAGNOSIS — Z79899 Other long term (current) drug therapy: Secondary | ICD-10-CM | POA: Diagnosis not present

## 2016-02-01 DIAGNOSIS — K589 Irritable bowel syndrome without diarrhea: Secondary | ICD-10-CM | POA: Diagnosis not present

## 2016-02-01 DIAGNOSIS — F419 Anxiety disorder, unspecified: Secondary | ICD-10-CM | POA: Diagnosis not present

## 2016-02-01 DIAGNOSIS — M7062 Trochanteric bursitis, left hip: Secondary | ICD-10-CM | POA: Diagnosis not present

## 2016-02-01 DIAGNOSIS — K219 Gastro-esophageal reflux disease without esophagitis: Secondary | ICD-10-CM | POA: Diagnosis not present

## 2016-02-01 DIAGNOSIS — M419 Scoliosis, unspecified: Secondary | ICD-10-CM | POA: Diagnosis not present

## 2016-02-01 DIAGNOSIS — M797 Fibromyalgia: Secondary | ICD-10-CM | POA: Diagnosis not present

## 2016-02-01 DIAGNOSIS — F329 Major depressive disorder, single episode, unspecified: Secondary | ICD-10-CM | POA: Diagnosis not present

## 2016-02-01 DIAGNOSIS — Z7982 Long term (current) use of aspirin: Secondary | ICD-10-CM | POA: Diagnosis not present

## 2016-02-01 DIAGNOSIS — G43909 Migraine, unspecified, not intractable, without status migrainosus: Secondary | ICD-10-CM | POA: Diagnosis not present

## 2016-02-01 DIAGNOSIS — I252 Old myocardial infarction: Secondary | ICD-10-CM | POA: Diagnosis not present

## 2016-02-01 HISTORY — DX: Raynaud's syndrome without gangrene: I73.00

## 2016-02-01 HISTORY — DX: Anxiety disorder, unspecified: F41.9

## 2016-02-01 HISTORY — DX: Anesthesia of skin: R20.0

## 2016-02-01 HISTORY — DX: Depression, unspecified: F32.A

## 2016-02-01 HISTORY — DX: Major depressive disorder, single episode, unspecified: F32.9

## 2016-02-01 HISTORY — DX: Reserved for inherently not codable concepts without codable children: IMO0001

## 2016-02-01 LAB — BASIC METABOLIC PANEL
Anion gap: 9 (ref 5–15)
BUN: 10 mg/dL (ref 6–20)
CALCIUM: 10 mg/dL (ref 8.9–10.3)
CO2: 29 mmol/L (ref 22–32)
CREATININE: 0.86 mg/dL (ref 0.44–1.00)
Chloride: 104 mmol/L (ref 101–111)
GFR calc Af Amer: >60 mL/min (ref >60)
GLUCOSE: 113 mg/dL — AB (ref 65–99)
Potassium: 4.4 mmol/L (ref 3.5–5.1)
Sodium: 142 mmol/L (ref 135–145)

## 2016-02-01 LAB — CBC
HCT: 45 % (ref 36.0–46.0)
Hemoglobin: 14.9 g/dL (ref 12.0–15.0)
MCH: 29.7 pg (ref 26.0–34.0)
MCHC: 33.1 g/dL (ref 30.0–36.0)
MCV: 89.8 fL (ref 78.0–100.0)
PLATELETS: 200 10*3/uL (ref 150–400)
RBC: 5.01 MIL/uL (ref 3.87–5.11)
RDW: 13.3 % (ref 11.5–15.5)
WBC: 5.5 10*3/uL (ref 4.0–10.5)

## 2016-02-01 NOTE — Progress Notes (Signed)
PCP - Dr. Allyn Kenner Cardiologist - Dr. Bronson Ing  EKG- 06/18/15 CXR - denies  Echo - 2014 Stress test - 2014 Cardiac Cath - 2014  Patient denies chest pain and shortness of breath at PAT appointment.

## 2016-02-02 NOTE — Progress Notes (Signed)
Anesthesia Chart Review:  Pt is a 65 year old female scheduled for L hip trochanteric bursectomy on 02/04/2016 with Dr. Kathrynn Speed.   Cardiologist is Dr. Kate Sable; he is aware of upcoming surgery.   PMH includes:  CAD (NSTEMI, cath 2014: OM2 small, subtl occl w/ thrombus distally - felt to be spont dissection, too small for PCI), Raynaud's disease, anemia, polycythemia, post-op N/V, GERD. Never smoker. BMI 23.5  Medications include: ASA, lipitor, protonix, ranexa  Preoperative labs reviewed.    Chest x-ray 06/17/15 reviewed. No acute abnormality  EKG 06/17/15: Sinus rhythm. Borderline prolonged PR interval. Left axis deviation. RSR' in V1 or V2, right VCD or RVH  Nuclear stress test 06/04/13: Low risk study. Old small inferolateral scar. No ischemia. Normal LV systolic function.  Echo 01/16/13:  - Left ventricle: The cavity size was at the upper limits of normal. Wall thickness was normal. Systolic function was normal. The estimated ejection fraction was in the range of 55% to 60%. Akinesis of the distalinferolateral myocardium. - Right ventricle: The cavity size was mildly dilated. Wall thickness was normal. - Atrial septum: No defect or patent foramen ovale wasidentified. - Pulmonary arteries: PA peak pressure: 7mm Hg (S).  Cardiac cath 01/16/13:  1. CAD with occlusion of a small OM-2 2. 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 EF 55% with focal area of hypokinesis in mid to distal anterolateral wall  If no changes, I anticipate pt can proceed with surgery as scheduled.   Willeen Cass, FNP-BC [facility]/Anesthesiology Phone: [phone] 02/02/2016 12:54 PM

## 2016-02-04 ENCOUNTER — Ambulatory Visit (HOSPITAL_COMMUNITY): Payer: 59 | Admitting: Emergency Medicine

## 2016-02-04 ENCOUNTER — Encounter (HOSPITAL_COMMUNITY): Payer: Self-pay | Admitting: General Practice

## 2016-02-04 ENCOUNTER — Encounter (HOSPITAL_COMMUNITY)
Admission: RE | Disposition: A | Discharge status: Home / Self Care | Payer: Self-pay | Source: Ambulatory Visit | Attending: Orthopaedic Surgery

## 2016-02-04 ENCOUNTER — Ambulatory Visit (HOSPITAL_COMMUNITY): Payer: 59 | Admitting: Anesthesiology

## 2016-02-04 ENCOUNTER — Observation Stay (HOSPITAL_COMMUNITY)
Admission: RE | Admit: 2016-02-04 | Discharge: 2016-02-05 | DRG: 502 | Disposition: A | Discharge status: Home / Self Care | Payer: 59 | Source: Ambulatory Visit | Attending: Orthopaedic Surgery | Admitting: Orthopaedic Surgery

## 2016-02-04 DIAGNOSIS — M797 Fibromyalgia: Secondary | ICD-10-CM | POA: Diagnosis not present

## 2016-02-04 DIAGNOSIS — F419 Anxiety disorder, unspecified: Secondary | ICD-10-CM | POA: Insufficient documentation

## 2016-02-04 DIAGNOSIS — K219 Gastro-esophageal reflux disease without esophagitis: Secondary | ICD-10-CM | POA: Insufficient documentation

## 2016-02-04 DIAGNOSIS — M419 Scoliosis, unspecified: Secondary | ICD-10-CM | POA: Diagnosis not present

## 2016-02-04 DIAGNOSIS — F329 Major depressive disorder, single episode, unspecified: Secondary | ICD-10-CM | POA: Diagnosis not present

## 2016-02-04 DIAGNOSIS — K589 Irritable bowel syndrome without diarrhea: Secondary | ICD-10-CM | POA: Diagnosis not present

## 2016-02-04 DIAGNOSIS — I2510 Atherosclerotic heart disease of native coronary artery without angina pectoris: Secondary | ICD-10-CM | POA: Insufficient documentation

## 2016-02-04 DIAGNOSIS — M71552 Other bursitis, not elsewhere classified, left hip: Secondary | ICD-10-CM | POA: Diagnosis not present

## 2016-02-04 DIAGNOSIS — M7062 Trochanteric bursitis, left hip: Secondary | ICD-10-CM | POA: Diagnosis not present

## 2016-02-04 DIAGNOSIS — Z79899 Other long term (current) drug therapy: Secondary | ICD-10-CM | POA: Insufficient documentation

## 2016-02-04 DIAGNOSIS — Z7982 Long term (current) use of aspirin: Secondary | ICD-10-CM | POA: Insufficient documentation

## 2016-02-04 DIAGNOSIS — G43909 Migraine, unspecified, not intractable, without status migrainosus: Secondary | ICD-10-CM | POA: Diagnosis not present

## 2016-02-04 DIAGNOSIS — I252 Old myocardial infarction: Secondary | ICD-10-CM | POA: Diagnosis not present

## 2016-02-04 HISTORY — DX: Anemia, unspecified: D64.9

## 2016-02-04 HISTORY — PX: TROCHANTERIC BURSA EXCISION: SHX2581

## 2016-02-04 HISTORY — DX: Migraine, unspecified, not intractable, without status migrainosus: G43.909

## 2016-02-04 HISTORY — PX: EXCISION/RELEASE BURSA HIP: SHX5014

## 2016-02-04 SURGERY — RELEASE, BURSA, TROCHANTERIC
Anesthesia: General | Site: Hip | Laterality: Left

## 2016-02-04 MED ORDER — CEFAZOLIN SODIUM-DEXTROSE 2-4 GM/100ML-% IV SOLN
INTRAVENOUS | Status: AC
  Administered 2016-02-04: 2 g via INTRAVENOUS
  Filled 2016-02-04: qty 100

## 2016-02-04 MED ORDER — HYDROMORPHONE HCL 1 MG/ML IJ SOLN
0.2500 mg | INTRAMUSCULAR | Status: DC | PRN
  Administered 2016-02-04 (×2): 0.5 mg via INTRAVENOUS

## 2016-02-04 MED ORDER — FENTANYL CITRATE (PF) 100 MCG/2ML IJ SOLN
INTRAMUSCULAR | Status: DC | PRN
  Administered 2016-02-04: 100 ug via INTRAVENOUS

## 2016-02-04 MED ORDER — PROPOFOL 10 MG/ML IV BOLUS
INTRAVENOUS | Status: DC | PRN
  Administered 2016-02-04: 135 mg via INTRAVENOUS

## 2016-02-04 MED ORDER — LACTATED RINGERS IV SOLN: INTRAVENOUS | Status: DC

## 2016-02-04 MED ORDER — SODIUM CHLORIDE 0.9 % IV SOLN: INTRAVENOUS | Status: DC

## 2016-02-04 MED ORDER — ACETAMINOPHEN 650 MG RE SUPP: 650.0000 mg | Freq: Four times a day (QID) | RECTAL | Status: DC | PRN

## 2016-02-04 MED ORDER — PROMETHAZINE HCL 12.5 MG PO TABS: 12.5000 mg | ORAL_TABLET | Freq: Four times a day (QID) | ORAL | Status: DC | PRN

## 2016-02-04 MED ORDER — ONDANSETRON HCL 4 MG/2ML IJ SOLN
INTRAMUSCULAR | Status: AC
  Administered 2016-02-04: 4 mg via INTRAVENOUS
  Filled 2016-02-04: qty 2

## 2016-02-04 MED ORDER — HYDROMORPHONE HCL 1 MG/ML IJ SOLN
INTRAMUSCULAR | Status: AC
  Administered 2016-02-04: 0.5 mg via INTRAVENOUS
  Filled 2016-02-04: qty 1

## 2016-02-04 MED ORDER — HYDROCODONE-ACETAMINOPHEN 5-325 MG PO TABS
1.0000 | ORAL_TABLET | ORAL | Status: DC | PRN
  Administered 2016-02-05: 2 via ORAL
  Filled 2016-02-04: qty 2

## 2016-02-04 MED ORDER — FENTANYL CITRATE (PF) 250 MCG/5ML IJ SOLN
INTRAMUSCULAR | Status: AC
  Filled 2016-02-04: qty 5

## 2016-02-04 MED ORDER — METOCLOPRAMIDE HCL 5 MG/ML IJ SOLN
INTRAMUSCULAR | Status: AC
  Administered 2016-02-04: 10 mg via INTRAVENOUS
  Filled 2016-02-04: qty 2

## 2016-02-04 MED ORDER — MIDAZOLAM HCL 2 MG/2ML IJ SOLN
INTRAMUSCULAR | Status: AC
  Filled 2016-02-04: qty 2

## 2016-02-04 MED ORDER — BUPIVACAINE HCL 0.25 % IJ SOLN
INTRAMUSCULAR | Status: DC | PRN
  Administered 2016-02-04: 10 mL

## 2016-02-04 MED ORDER — SUGAMMADEX SODIUM 200 MG/2ML IV SOLN
INTRAVENOUS | Status: DC | PRN
  Administered 2016-02-04: 200 mg via INTRAVENOUS

## 2016-02-04 MED ORDER — SODIUM CHLORIDE 0.9 % IR SOLN
Status: DC | PRN
  Administered 2016-02-04: 1000 mL

## 2016-02-04 MED ORDER — LIDOCAINE HCL (CARDIAC) 20 MG/ML IV SOLN
INTRAVENOUS | Status: DC | PRN
  Administered 2016-02-04: 30 mg via INTRAVENOUS

## 2016-02-04 MED ORDER — METOCLOPRAMIDE HCL 5 MG/ML IJ SOLN
10.0000 mg | Freq: Once | INTRAMUSCULAR | Status: AC
Start: 2016-02-04 — End: 2016-02-04
  Administered 2016-02-04: 10 mg via INTRAVENOUS

## 2016-02-04 MED ORDER — LACTATED RINGERS IV SOLN
INTRAVENOUS | Status: DC | PRN
  Administered 2016-02-04 (×2): via INTRAVENOUS

## 2016-02-04 MED ORDER — ACETAMINOPHEN 325 MG PO TABS: 650.0000 mg | ORAL_TABLET | Freq: Four times a day (QID) | ORAL | Status: DC | PRN

## 2016-02-04 MED ORDER — ROCURONIUM BROMIDE 100 MG/10ML IV SOLN
INTRAVENOUS | Status: DC | PRN
  Administered 2016-02-04: 40 mg via INTRAVENOUS

## 2016-02-04 MED ORDER — DEXAMETHASONE SODIUM PHOSPHATE 10 MG/ML IJ SOLN
INTRAMUSCULAR | Status: DC | PRN
  Administered 2016-02-04: 10 mg via INTRAVENOUS

## 2016-02-04 MED ORDER — ALPRAZOLAM 0.5 MG PO TABS: 0.5000 mg | ORAL_TABLET | Freq: Two times a day (BID) | ORAL | Status: DC | PRN

## 2016-02-04 MED ORDER — PROMETHAZINE HCL 25 MG PO TABS
12.5000 mg | ORAL_TABLET | Freq: Three times a day (TID) | ORAL | Status: AC | PRN
  Administered 2016-02-04: 12.5 mg via ORAL
  Filled 2016-02-04: qty 1

## 2016-02-04 MED ORDER — ONDANSETRON HCL 4 MG/2ML IJ SOLN
4.0000 mg | Freq: Once | INTRAMUSCULAR | Status: AC
  Administered 2016-02-04: 4 mg via INTRAVENOUS

## 2016-02-04 MED ORDER — HYDROCODONE-ACETAMINOPHEN 5-325 MG PO TABS: 1.0000 | ORAL_TABLET | ORAL | Status: DC | PRN

## 2016-02-04 MED ORDER — MIDAZOLAM HCL 5 MG/5ML IJ SOLN
INTRAMUSCULAR | Status: DC | PRN
  Administered 2016-02-04: 2 mg via INTRAVENOUS

## 2016-02-04 MED ORDER — PHENYLEPHRINE HCL 10 MG/ML IJ SOLN
INTRAMUSCULAR | Status: DC | PRN
  Administered 2016-02-04 (×3): 80 ug via INTRAVENOUS

## 2016-02-04 MED ORDER — ARTIFICIAL TEARS OP OINT
TOPICAL_OINTMENT | OPHTHALMIC | Status: DC | PRN
  Administered 2016-02-04: 1 via OPHTHALMIC

## 2016-02-04 MED ORDER — ONDANSETRON HCL 4 MG/2ML IJ SOLN
INTRAMUSCULAR | Status: DC | PRN
  Administered 2016-02-04: 4 mg via INTRAVENOUS

## 2016-02-04 MED ORDER — PROCHLORPERAZINE EDISYLATE 5 MG/ML IJ SOLN
10.0000 mg | Freq: Four times a day (QID) | INTRAMUSCULAR | Status: DC | PRN
  Administered 2016-02-04: 10 mg via INTRAVENOUS
  Filled 2016-02-04 (×2): qty 2

## 2016-02-04 MED ORDER — PROMETHAZINE HCL 25 MG RE SUPP
12.5000 mg | Freq: Four times a day (QID) | RECTAL | Status: DC | PRN
  Filled 2016-02-04: qty 1

## 2016-02-04 MED ORDER — BUPIVACAINE HCL (PF) 0.25 % IJ SOLN
INTRAMUSCULAR | Status: AC
  Filled 2016-02-04: qty 30

## 2016-02-04 SURGICAL SUPPLY — 44 items
BAG DECANTER FOR FLEXI CONT (MISCELLANEOUS) IMPLANT
CLSR STERI-STRIP ANTIMIC 1/2X4 (GAUZE/BANDAGES/DRESSINGS) ×1 IMPLANT
COVER SURGICAL LIGHT HANDLE (MISCELLANEOUS) ×2 IMPLANT
DRAPE IMP U-DRAPE 54X76 (DRAPES) ×2 IMPLANT
DRAPE ORTHO SPLIT 77X108 STRL (DRAPES) ×4
DRAPE SURG ORHT 6 SPLT 77X108 (DRAPES) ×2 IMPLANT
DRAPE U-SHAPE 47X51 STRL (DRAPES) ×2 IMPLANT
DRSG ADAPTIC 3X8 NADH LF (GAUZE/BANDAGES/DRESSINGS) ×2 IMPLANT
DRSG AQUACEL AG ADV 3.5X10 (GAUZE/BANDAGES/DRESSINGS) ×1 IMPLANT
DRSG PAD ABDOMINAL 8X10 ST (GAUZE/BANDAGES/DRESSINGS) ×2 IMPLANT
DURAPREP 26ML APPLICATOR (WOUND CARE) ×2 IMPLANT
ELECT CAUTERY BLADE 6.4 (BLADE) IMPLANT
ELECT REM PT RETURN 9FT ADLT (ELECTROSURGICAL)
ELECTRODE REM PT RTRN 9FT ADLT (ELECTROSURGICAL) IMPLANT
GAUZE SPONGE 4X4 12PLY STRL (GAUZE/BANDAGES/DRESSINGS) ×2 IMPLANT
GLOVE BIO SURGEON STRL SZ8 (GLOVE) ×2 IMPLANT
GLOVE BIOGEL PI IND STRL 8 (GLOVE) ×1 IMPLANT
GLOVE BIOGEL PI INDICATOR 8 (GLOVE) ×1
GLOVE ORTHO TXT STRL SZ7.5 (GLOVE) ×2 IMPLANT
GOWN STRL REUS W/ TWL LRG LVL3 (GOWN DISPOSABLE) ×1 IMPLANT
GOWN STRL REUS W/ TWL XL LVL3 (GOWN DISPOSABLE) ×4 IMPLANT
GOWN STRL REUS W/TWL LRG LVL3 (GOWN DISPOSABLE) ×2
GOWN STRL REUS W/TWL XL LVL3 (GOWN DISPOSABLE) ×8
HANDPIECE INTERPULSE COAX TIP (DISPOSABLE)
KIT BASIN OR (CUSTOM PROCEDURE TRAY) ×2 IMPLANT
KIT ROOM TURNOVER OR (KITS) ×2 IMPLANT
MANIFOLD NEPTUNE II (INSTRUMENTS) ×2 IMPLANT
NS IRRIG 1000ML POUR BTL (IV SOLUTION) ×2 IMPLANT
PACK TOTAL JOINT (CUSTOM PROCEDURE TRAY) ×2 IMPLANT
PACK UNIVERSAL I (CUSTOM PROCEDURE TRAY) ×2 IMPLANT
PAD ARMBOARD 7.5X6 YLW CONV (MISCELLANEOUS) ×4 IMPLANT
SET HNDPC FAN SPRY TIP SCT (DISPOSABLE) IMPLANT
SPONGE LAP 18X18 X RAY DECT (DISPOSABLE) ×2 IMPLANT
STAPLER VISISTAT 35W (STAPLE) ×2 IMPLANT
SUT ETHILON 2 0 FS 18 (SUTURE) ×2 IMPLANT
SUT VIC AB 1 CTB1 27 (SUTURE) ×4 IMPLANT
SUT VIC AB 2-0 CT1 27 (SUTURE) ×4
SUT VIC AB 2-0 CT1 TAPERPNT 27 (SUTURE) ×2 IMPLANT
SUT VICRYL 0 CT 1 36IN (SUTURE) ×2 IMPLANT
TOWEL OR 17X24 6PK STRL BLUE (TOWEL DISPOSABLE) ×2 IMPLANT
TOWEL OR 17X26 10 PK STRL BLUE (TOWEL DISPOSABLE) ×2 IMPLANT
TUBE ANAEROBIC SPECIMEN COL (MISCELLANEOUS) IMPLANT
UNDERPAD 30X30 INCONTINENT (UNDERPADS AND DIAPERS) ×2 IMPLANT
WATER STERILE IRR 1000ML POUR (IV SOLUTION) ×2 IMPLANT

## 2016-02-04 NOTE — H&P (Signed)
Stacey Lawrence is an 65 y.o. female.   Chief Complaint: chronic left hip pain HPI:   65 yo active female with chronic bilateral hip trochanteric bursitis that has slowly worsened over the years.  She has done everything she can to try to get this to resolve and has failed all forms of conservative treatment including NSAIDS, rest, activity modification, physical therapy, ice, heat, and multiple injections.  Past Medical History  Diagnosis Date  . PONV (postoperative nausea and vomiting)   . Fibromyalgia   . Migraine headache     a. migraines and cluster;last migraine 3 mon ago  . Joint pain   . Joint swelling   . Scoliosis   . GERD (gastroesophageal reflux disease)     hasn't started taking any meds  . IBS (irritable bowel syndrome)     constipation  . Hx of colonic polyps   . Kidney stones 03/2010  . History of blood transfusion     88yrs ago  . Polycythemia   . CAD (coronary artery disease)     a. 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 05/2013 Lexi CL: EF 77%, old small inferolat scar, no ischemia.  . Myocardial infarction ([place] [facility])   . Raynaud's disease   . Shortness of breath dyspnea     with exertion  . Anxiety   . Depression   . History of anemia   . Numbness     right leg    Past Surgical History  Procedure Laterality Date  . Abdominal hysterectomy      part. hyst.  . Tubal ligation    . Cholecystectomy  10/28/2002    lap.  . Debridement tennis elbow    . Elbow surgery      golfer's elbow-bilateral  . Knee debridement      right  . Appendectomy      with explor. lap.  . Stapedectomy      right  . Trigger finger release  08/19/2011    Procedure: RELEASE TRIGGER FINGER/A-1 PULLEY;  Surgeon: Willa Frater III;   Location: [place];  Service: Orthopedics;  Laterality: Right;  right middle finger a-1 pulley release with tenosynovectomy  . Tonsillectomy and adenoidectomy    . Diagnostic laparoscopy    . Dilation and curettage of uterus    . Tympanoplasty    . Colonoscopy    . I&d extremity  02/25/2012    Procedure: IRRIGATION AND DEBRIDEMENT EXTREMITY;  Surgeon: Roseanne Kaufman, MD;  Location: [place];  Service: Orthopedics;  Laterality: Right;  Irrigation and Debridement and Repair Right Thumb Nerve Laceration and Assoiated Structures   . Laparotomy    . Esophagogastroduodenoscopy  09/10/2012    Procedure: ESOPHAGOGASTRODUODENOSCOPY (EGD);  Surgeon: Rogene Houston, MD;  [facility];  Service: Endoscopy;  Laterality: N/A;  730  . Left heart catheterization with coronary angiogram N/A 01/16/2013    Procedure: LEFT HEART CATHETERIZATION WITH CORONARY ANGIOGRAM;  Surgeon: Jolaine Artist, MD;  Location: [facility] CATH LAB;  Service: Cardiovascular;  Laterality: N/A;  . Cardiac catheterization      Family History  Problem Relation Age of Onset  . Cancer Mother     Deceased with lung CA  .  Heart failure Mother   . Heart disease Mother   . COPD Mother   . Varicose Veins Mother   . Cancer Father     Deceased with lung CA  . Heart disease Father   . Varicose Veins Father    Social History:  reports that she has never smoked. She has never used smokeless tobacco. She reports that she drinks alcohol. She reports that she does not use illicit drugs.  Allergies:  Allergies  Allergen Reactions  . Dilaudid [Hydromorphone Hcl] Other (See Comments)    Resp. arrest  . Codeine Nausea And Vomiting and Rash  . Morphine And Related Nausea And Vomiting    Medications Prior to Admission  Medication Sig Dispense Refill  . acetaminophen (TYLENOL) 325 MG tablet Take 650 mg by mouth every 6 (six) hours as needed for mild pain.    Marland Kitchen ALPRAZolam (XANAX) 0.5 MG tablet Take 0.5 mg by mouth 2  (two) times daily as needed. anxiety  0  . Ascorbic Acid (VITAMIN C PO) Take 2 tablets by mouth daily.    Marland Kitchen aspirin EC 81 MG EC tablet Take 1 tablet (81 mg total) by mouth daily.    Marland Kitchen atorvastatin (LIPITOR) 40 MG tablet Take 1 tablet (40 mg total) by mouth daily. 90 tablet 4  . Biotin (BIOTIN 5000) 5 MG CAPS Take 1 capsule by mouth daily.    . Calcium Citrate-Vitamin D (CALCITRATE/VITAMIN D PO) Take 1 tablet by mouth daily.    . carisoprodol (SOMA) 350 MG tablet Take 350 mg by mouth 2 (two) times daily.    Marland Kitchen docusate sodium (COLACE) 100 MG capsule Take 100 mg by mouth daily as needed for mild constipation.    . meloxicam (MOBIC) 15 MG tablet Take 15 mg by mouth daily.    . naproxen sodium (ANAPROX) 220 MG tablet Take 220 mg by mouth 2 (two) times daily as needed (pain).    Marland Kitchen NITROSTAT 0.4 MG SL tablet PLACE 1 TABLET UNDER THE TONGUE EVERY 5 MINUTES AS NEEDED FOR CHEST PAIN 100 tablet 1  . pantoprazole (PROTONIX) 40 MG tablet TAKE 1 TABLET BY MOUTH ONCE DAILY 30 tablet 5  . ranolazine (RANEXA) 1000 MG SR tablet Take 1 tablet (1,000 mg total) by mouth 2 (two) times daily. 180 tablet 3  . Vitamin D, Ergocalciferol, (DRISDOL) 50000 units CAPS capsule Take 50,000 Units by mouth daily.    . promethazine (PHENERGAN) 25 MG tablet Take 25 mg by mouth every 6 (six) hours as needed for nausea or vomiting.      No results found for this or any previous visit (from the past 48 hour(s)). No results found.  Review of Systems  All other systems reviewed and are negative.   Blood pressure 110/61, pulse 71, temperature 97.8 F (36.6 C), resp. rate 20, height 5\' 8"  (1.727 m), weight 69.854 kg (154 lb), SpO2 98 %. Physical Exam  Constitutional: She is oriented to person, place, and time. She appears well-developed and well-nourished.  HENT:  Head: Normocephalic and atraumatic.  Eyes: EOM are normal. Pupils are equal, round, and reactive to light.  Neck: Normal range of motion. Neck supple.   Cardiovascular: Normal rate and regular rhythm.   Respiratory: Effort normal and breath sounds normal.  GI: Soft. Bowel sounds are normal.  Musculoskeletal:       Left hip: She exhibits tenderness and bony tenderness.  Neurological: She is alert and oriented to person, place, and time.  Skin: Skin is warm and  dry.  Psychiatric: She has a normal mood and affect.     Assessment/Plan Chronic left hip trochanteric bursitis 1)  Given the failure of all forms of conservative treatment, we have planned to proceed to surgery for a left hip trochanteric bursectomy.  Risks and benefits have been discussed in detail and informed consent obtained.  Mcarthur Rossetti, MD 02/04/2016, 10:13 AM

## 2016-02-04 NOTE — Anesthesia Procedure Notes (Signed)
Procedure Name: Intubation Date/Time: 02/04/2016 10:29 AM Performed by: Neldon Newport Pre-anesthesia Checklist: Patient being monitored, Suction available, Emergency Drugs available, Patient identified and Timeout performed Patient Re-evaluated:Patient Re-evaluated prior to inductionOxygen Delivery Method: Circle system utilized Preoxygenation: Pre-oxygenation with 100% oxygen Intubation Type: IV induction Ventilation: Mask ventilation without difficulty Laryngoscope Size: Mac and 4 Grade View: Grade I Tube type: Oral Tube size: 7.0 mm Number of attempts: 1 Placement Confirmation: positive ETCO2,  ETT inserted through vocal cords under direct vision and breath sounds checked- equal and bilateral Secured at: 22 cm Tube secured with: Tape Dental Injury: Teeth and Oropharynx as per pre-operative assessment

## 2016-02-04 NOTE — Discharge Instructions (Signed)
You may put all of your weight on your left hip/leg. Do not sleep or lay on your left side. Increase your activities as comfort allows, but no high impact aerobic activities. Expect swelling - ice as needed. You can get your current hip dressing wet in the shower daily. You can leave this dressing on until your outpatient follow-up.

## 2016-02-04 NOTE — Transfer of Care (Signed)
Immediate Anesthesia Transfer of Care Note  Patient: Stacey Lawrence  Procedure(s) Performed: Procedure(s): LEFT HIP TROCHANTERIC BURSECTOMY (Left)  Patient Location: PACU  Anesthesia Type:General  Level of Consciousness: awake, alert  and oriented  Airway & Oxygen Therapy: Patient Spontanous Breathing and Patient connected to nasal cannula oxygen  Post-op Assessment: Report given to RN and Post -op Vital signs reviewed and stable  Post vital signs: Reviewed and stable  Last Vitals:  Filed Vitals:   02/04/16 0851  BP: 110/61  Pulse: 71  Temp: 36.6 C  Resp: 20    Last Pain:  Filed Vitals:   02/04/16 0852  PainSc: 6       Patients Stated Pain Goal: 4 ([id number])  Complications: No apparent anesthesia complications

## 2016-02-04 NOTE — Brief Op Note (Signed)
02/04/2016  11:15 AM  PATIENT:  Stacey Lawrence  65 y.o. female  PRE-OPERATIVE DIAGNOSIS:  chronic trochanteric bursitis left hip  POST-OPERATIVE DIAGNOSIS:  chronic trochanteric bursitis left hip  PROCEDURE:  Procedure(s): LEFT HIP TROCHANTERIC BURSECTOMY (Left)  SURGEON:  Surgeon(s) and Role:    * Mcarthur Rossetti, MD - Primary  PHYSICIAN ASSISTANT: Benita Stabile, PA-C  ANESTHESIA:   local and general  EBL:   50 cc  LOCAL MEDICATIONS USED:  MARCAINE     COUNTS:  YES  DICTATION: .Other Dictation: Dictation Number [id number]  PLAN OF CARE: Discharge to home after PACU  PATIENT DISPOSITION:  PACU - hemodynamically stable.   Delay start of Pharmacological VTE agent (>24hrs) due to surgical blood loss or risk of bleeding: not applicable

## 2016-02-04 NOTE — Anesthesia Postprocedure Evaluation (Signed)
Anesthesia Post Note  Patient: Stacey Lawrence  Procedure(s) Performed: Procedure(s) (LRB): LEFT HIP TROCHANTERIC BURSECTOMY (Left)  Patient location during evaluation: PACU Anesthesia Type: General Level of consciousness: awake and alert Pain management: pain level controlled Vital Signs Assessment: post-procedure vital signs reviewed and stable Respiratory status: spontaneous breathing, nonlabored ventilation, respiratory function stable and patient connected to nasal cannula oxygen Cardiovascular status: blood pressure returned to baseline and stable Postop Assessment: no signs of nausea or vomiting Anesthetic complications: no    Last Vitals:  Filed Vitals:   02/04/16 0851 02/04/16 1135  BP: 110/61 129/77  Pulse: 71 93  Temp: 36.6 C 36.2 C  Resp: 20 14    Last Pain:  Filed Vitals:   02/04/16 1212  PainSc: 8                  Lukas Pelcher S

## 2016-02-04 NOTE — Anesthesia Preprocedure Evaluation (Addendum)
Anesthesia Evaluation  Patient identified by MRN, date of birth, ID band Patient awake    Reviewed: Allergy & Precautions, NPO status , Patient's Chart, lab work & pertinent test results  History of Anesthesia Complications (+) history of anesthetic complications  Airway Mallampati: II  TM Distance: >3 FB Neck ROM: Full    Dental no notable dental hx. (+) Dental Advidsory Given, Teeth Intact   Pulmonary neg pulmonary ROS, shortness of breath and with exertion,    Pulmonary exam normal breath sounds clear to auscultation       Cardiovascular + angina with exertion + CAD, + Past MI and + Peripheral Vascular Disease  Normal cardiovascular exam Rhythm:Regular Rate:Normal     Neuro/Psych  Headaches, PSYCHIATRIC DISORDERS  Neuromuscular disease negative neurological ROS  negative psych ROS   GI/Hepatic negative GI ROS, Neg liver ROS, GERD  Medicated and Controlled,  Endo/Other  negative endocrine ROS  Renal/GU negative Renal ROS  negative genitourinary   Musculoskeletal negative musculoskeletal ROS (+)   Abdominal   Peds negative pediatric ROS (+)  Hematology negative hematology ROS (+)   Anesthesia Other Findings   Reproductive/Obstetrics negative OB ROS                            Anesthesia Physical Anesthesia Plan  ASA: III  Anesthesia Plan: General   Post-op Pain Management:    Induction: Intravenous  Airway Management Planned: Oral ETT  Additional Equipment:   Intra-op Plan:   Post-operative Plan: Extubation in OR  Informed Consent: I have reviewed the patients History and Physical, chart, labs and discussed the procedure including the risks, benefits and alternatives for the proposed anesthesia with the patient or authorized representative who has indicated his/her understanding and acceptance.   Dental advisory given and Dental Advisory Given  Plan Discussed with: CRNA,  Surgeon and Anesthesiologist  Anesthesia Plan Comments:        Anesthesia Quick Evaluation

## 2016-02-05 ENCOUNTER — Encounter (HOSPITAL_COMMUNITY): Payer: Self-pay | Admitting: Orthopaedic Surgery

## 2016-02-05 DIAGNOSIS — G43909 Migraine, unspecified, not intractable, without status migrainosus: Secondary | ICD-10-CM | POA: Diagnosis not present

## 2016-02-05 DIAGNOSIS — I2510 Atherosclerotic heart disease of native coronary artery without angina pectoris: Secondary | ICD-10-CM | POA: Diagnosis not present

## 2016-02-05 DIAGNOSIS — I252 Old myocardial infarction: Secondary | ICD-10-CM | POA: Diagnosis not present

## 2016-02-05 DIAGNOSIS — K219 Gastro-esophageal reflux disease without esophagitis: Secondary | ICD-10-CM | POA: Diagnosis not present

## 2016-02-05 DIAGNOSIS — M797 Fibromyalgia: Secondary | ICD-10-CM | POA: Diagnosis not present

## 2016-02-05 DIAGNOSIS — K589 Irritable bowel syndrome without diarrhea: Secondary | ICD-10-CM | POA: Diagnosis not present

## 2016-02-05 DIAGNOSIS — F329 Major depressive disorder, single episode, unspecified: Secondary | ICD-10-CM | POA: Diagnosis not present

## 2016-02-05 DIAGNOSIS — M419 Scoliosis, unspecified: Secondary | ICD-10-CM | POA: Diagnosis not present

## 2016-02-05 DIAGNOSIS — M7062 Trochanteric bursitis, left hip: Secondary | ICD-10-CM | POA: Diagnosis not present

## 2016-02-05 MED FILL — HYDROCODON-APAP 5-325: 5-325 | 5 days supply | Qty: 60 | Fill #0

## 2016-02-05 MED FILL — PROMETHAZINE 12.5 MG TABLET: 12.5 | 5 days supply | Qty: 20 | Fill #0

## 2016-02-05 NOTE — Op Note (Signed)
NAMEKERRIN, Stacey NO.:  [id number]  MEDICAL RECORD NO.:  [id number]  LOCATION:  5N19C                        FACILITY:  [facility]  PHYSICIAN:  Lind Guest. Ninfa Linden, M.D.DATE OF BIRTH:  [birth year]  DATE OF PROCEDURE:  02/04/2016 DATE OF DISCHARGE:                              OPERATIVE REPORT   PREOPERATIVE DIAGNOSIS:  Chronic left hip trochanteric bursitis.  POSTOPERATIVE DIAGNOSIS:  Chronic left hip trochanteric bursitis.  PROCEDURE:  Left hip trochanteric bursectomy.  SURGEON:  Lind Guest. Ninfa Linden, M.D.  ASSISTANT:  Erskine Emery, PA-C.  ANESTHESIA: 1. General. 2. Local with 0.25% plain Marcaine.  BLOOD LOSS:  Less than 50 mL.  COMPLICATIONS:  None.  INDICATIONS:  Stacey Lawrence is a 65 year old very active individual who has dealt with chronic bilateral hip trochanteric bursitis for many many years now.  The left is worse than the right.  She has tried and failed all forms of conservative treatment including anti-inflammatories, rest, ice, heat, physical therapy, dry needling, multiple injections and time, nothing has improved and has gotten quite worse.  At this point, she wishes for an operative intervention.  I talked to her in detail about this and about the risks and benefits of surgery and she does wish to proceed.  PROCEDURE DESCRIPTION:  After informed consent was obtained, appropriate left hip was marked.  She was brought to the operating room and placed supine on the operating table where general anesthesia was then obtained.  She was then turned into a lateral position with hip positioners in the front and back and axillary roll in place and appropriate padding of her arms, head and neck.  Her left hip was then prepped and draped with DuraPrep and sterile drapes including a sterile stockinette.  A time-out was called, she was identified as correct patient and correct left hip.  I then made an incision over the greater trochanter  and carried this proximally and distally.  I dissected down the iliotibial band.  The iliotibial band was then divided longitudinally, so I could see the greater trochanteric area.  There was a large bursa sac in this area that was inflamed.  I removed in its entirety as well as inflamed tissue.  I then removed the sharp bony tissue on the edge of the trochanteric area as well.  Her gluteus minimus tendons looked good and were intact.  I then irrigated the soft tissue with normal saline solution.  I loosely reapproximated the IT band in stepwise fashion given some lengthening of this as well.  I then closed the deep tissue with 0 Vicryl followed by 2-0 Vicryl in the subcutaneous tissue and 4-0 Monocryl subcuticular stitch.  We infiltrated the incision with 0.25% plain Marcaine and placed Steri-Strips across the skin.  An Aquacel dressing was applied.  She was then rolled back into supine position, awakened, extubated and taken to the recovery room in stable condition.  All final counts were correct.  There were no complications noted.     Lind Guest. Ninfa Linden, M.D.     CYB/MEDQ  D:  02/04/2016  T:  02/05/2016  Job:  [id number]

## 2016-02-05 NOTE — Discharge Summary (Signed)
Patient ID: Stacey Lawrence MRN: [id number] DOB/AGE: 63-Feb-1952 63 y.o.  Admit date: 02/04/2016 Discharge date: 02/05/2016  Admission Diagnoses:  Principal Problem:   Trochanteric bursitis of left hip   Discharge Diagnoses:  Same  Past Medical History  Diagnosis Date  . PONV (postoperative nausea and vomiting)   . Fibromyalgia   . Joint pain   . Joint swelling   . Scoliosis   . GERD (gastroesophageal reflux disease)     hasn't started taking any meds  . IBS (irritable bowel syndrome)     constipation  . Hx of colonic polyps   . Kidney stones 03/2010  . History of blood transfusion     67yrs ago  . Polycythemia   . CAD (coronary artery disease)     a. 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 05/2013 Lexi CL: EF 77%, old small inferolat scar, no ischemia.  . Myocardial infarction ([place])   . Raynaud's disease   . Shortness of breath dyspnea     with exertion  . Anxiety   . Depression   . Numbness     right leg  . Anemia     hx  . Migraine     "q other week" (02/04/2016)    Surgeries: Procedure(s): LEFT HIP TROCHANTERIC BURSECTOMY on 02/04/2016   Consultants:    Discharged Condition: Improved  Hospital Course: VIHA BARTOLOMEI is an 65 y.o. female who was admitted 02/04/2016 for operative treatment ofTrochanteric bursitis of left hip. Patient has severe unremitting pain that affects sleep, daily activities, and work/hobbies. After pre-op clearance the patient was taken to the operating room on 02/04/2016 and underwent  Procedure(s): [place].    Patient was given perioperative antibiotics: Anti-infectives    Start     Dose/Rate Route Frequency Ordered Stop   02/04/16 1016  ceFAZolin (ANCEF) 2-4  GM/100ML-% IVPB    Comments:  Forte, Lindsi   : cabinet override      02/04/16 1016 02/04/16 1032       Patient was given sequential compression devices, early ambulation, and chemoprophylaxis to prevent DVT.  Patient benefited maximally from hospital stay and there were no complications.    Recent vital signs: Patient Vitals for the past 24 hrs:  BP Temp Temp src Pulse Resp SpO2 Height Weight  02/04/16 2300 110/65 mmHg 98 F (36.7 C) Oral - 13 - - -  02/04/16 1330 115/76 mmHg 97.7 F (36.5 C) - - - - - -  02/04/16 1250 111/66 mmHg - - 71 10 98 % - -  02/04/16 1248 - - - 62 11 98 % - -  02/04/16 1240 111/66 mmHg 97.4 F (36.3 C) - 74 10 98 % - -  02/04/16 1236 - - - 65 10 100 % - -  02/04/16 1224 - - - 82 14 100 % - -  02/04/16 1212 - - - 83 15 100 % - -  02/04/16 1200 - - - 72 (!) 9 100 % - -  02/04/16 1148 - - - 80 15 100 % - -  02/04/16 1136 - - - 84 14 100 % - -  02/04/16 1135 129/77 mmHg 97.2 F (36.2 C) - 93  14 100 % - -  02/04/16 0851 110/61 mmHg 97.8 F (36.6 C) - 71 20 98 % 5\' 8"  (1.727 m) 69.854 kg (154 lb)     Recent laboratory studies: No results for input(s): WBC, HGB, HCT, PLT, NA, K, CL, CO2, BUN, CREATININE, GLUCOSE, INR, CALCIUM in the last 72 hours.  Invalid input(s): PT, 2   Discharge Medications:     Medication List    TAKE these medications        acetaminophen 325 MG tablet  Commonly known as:  TYLENOL  Take 650 mg by mouth every 6 (six) hours as needed for mild pain.     ALPRAZolam 0.5 MG tablet  Commonly known as:  XANAX  Take 0.5 mg by mouth 2 (two) times daily as needed. anxiety     aspirin 81 MG EC tablet  Take 1 tablet (81 mg total) by mouth daily.     atorvastatin 40 MG tablet  Commonly known as:  LIPITOR  Take 1 tablet (40 mg total) by mouth daily.     BIOTIN 5000 5 MG Caps  Generic drug:  Biotin  Take 1 capsule by mouth daily.     CALCITRATE/VITAMIN D PO  Take 1 tablet by mouth daily.     carisoprodol 350 MG tablet   Commonly known as:  SOMA  Take 350 mg by mouth 2 (two) times daily.     docusate sodium 100 MG capsule  Commonly known as:  COLACE  Take 100 mg by mouth daily as needed for mild constipation.     HYDROcodone-acetaminophen 5-325 MG tablet  Commonly known as:  NORCO  Take 1-2 tablets by mouth every 4 (four) hours as needed for moderate pain.     meloxicam 15 MG tablet  Commonly known as:  MOBIC  Take 15 mg by mouth daily.     naproxen sodium 220 MG tablet  Commonly known as:  ANAPROX  Take 220 mg by mouth 2 (two) times daily as needed (pain).     NITROSTAT 0.4 MG SL tablet  Generic drug:  nitroGLYCERIN  PLACE 1 TABLET UNDER THE TONGUE EVERY 5 MINUTES AS NEEDED FOR CHEST PAIN     pantoprazole 40 MG tablet  Commonly known as:  PROTONIX  TAKE 1 TABLET BY MOUTH ONCE DAILY     promethazine 25 MG tablet  Commonly known as:  PHENERGAN  Take 25 mg by mouth every 6 (six) hours as needed for nausea or vomiting.     promethazine 12.5 MG tablet  Commonly known as:  PHENERGAN  Take 1 tablet (12.5 mg total) by mouth every 6 (six) hours as needed for nausea or vomiting.     ranolazine 1000 MG SR tablet  Commonly known as:  RANEXA  Take 1 tablet (1,000 mg total) by mouth 2 (two) times daily.     VITAMIN C PO  Take 2 tablets by mouth daily.     Vitamin D (Ergocalciferol) 50000 units Caps capsule  Commonly known as:  DRISDOL  Take 50,000 Units by mouth daily.        Diagnostic Studies: No results found.  Disposition: 01-Home or Self Care      Discharge Instructions    Discharge patient    Complete by:  As directed      Discharge patient    Complete by:  As directed            Follow-up Information    Follow up with Mcarthur Rossetti, MD. Schedule  an appointment as soon as possible for a visit in 2 weeks.   Specialty:  Orthopedic Surgery   Contact information:   [name] [id number] [phone]        Signed: Mcarthur Rossetti 02/05/2016, 6:55 AM

## 2016-02-05 NOTE — Progress Notes (Signed)
Patient ID: Stacey Lawrence, female   DOB: [birth year], 65 y.o.   MRN: [id number] Still with migraine headache, but feels like she can go home.  Her left hip is stable.  Discharge to home today.

## 2016-02-05 NOTE — Progress Notes (Signed)
D/c rx and instructions given and explained to patient and husband with understanding/ VSS pain meds given for d/c dsg cdi

## 2016-02-16 MED FILL — CARISOPRODOL 350 MG TABLET: 350 | 20 days supply | Qty: 60 | Fill #1

## 2016-02-17 MED FILL — ALPRAZolam 0.5 MG TABS: 0.5 | 30 days supply | Qty: 60 | Fill #0

## 2016-02-24 DIAGNOSIS — I2510 Atherosclerotic heart disease of native coronary artery without angina pectoris: Secondary | ICD-10-CM | POA: Diagnosis not present

## 2016-02-24 DIAGNOSIS — G894 Chronic pain syndrome: Secondary | ICD-10-CM | POA: Diagnosis not present

## 2016-02-24 DIAGNOSIS — K589 Irritable bowel syndrome without diarrhea: Secondary | ICD-10-CM | POA: Diagnosis not present

## 2016-03-03 ENCOUNTER — Telehealth: Payer: Self-pay | Admitting: Cardiovascular Disease

## 2016-03-03 MED ORDER — ATORVASTATIN CALCIUM 40 MG PO TABS: 40.0000 mg | ORAL_TABLET | Freq: Every day | ORAL | Status: DC

## 2016-03-03 MED FILL — ATORVASTATIN 40 MG TABLET: 40 | 90 days supply | Qty: 90 | Fill #0

## 2016-03-03 NOTE — Telephone Encounter (Signed)
Sent to [facility]

## 2016-03-03 NOTE — Telephone Encounter (Signed)
Needs Lipitor refilled

## 2016-03-04 MED FILL — CIPROFLOXACIN HCL 250 MG TA: 250 | 5 days supply | Qty: 10 | Fill #0

## 2016-03-09 ENCOUNTER — Telehealth (INDEPENDENT_AMBULATORY_CARE_PROVIDER_SITE_OTHER): Payer: Self-pay | Admitting: *Deleted

## 2016-03-09 NOTE — Telephone Encounter (Signed)
Patient called and ask if Dr.Rehman would call her something in for Constipation she has been for 7 days. She has had a recent surgery and is taking pain medicatons , ect.  She has taken natural laxatives otc, it took 8 tablets before results.  Per Dr.Rehman the patient may use Amitiza 24 mcg - take 1 by mouth daily. He gave permission to give her samples. Pt. Was called and made aware , she will pick them up on 03/10/2016.

## 2016-03-16 DIAGNOSIS — N390 Urinary tract infection, site not specified: Secondary | ICD-10-CM | POA: Diagnosis not present

## 2016-03-16 DIAGNOSIS — R7301 Impaired fasting glucose: Secondary | ICD-10-CM | POA: Diagnosis not present

## 2016-03-23 ENCOUNTER — Telehealth: Payer: Self-pay | Admitting: Cardiovascular Disease

## 2016-03-23 NOTE — Telephone Encounter (Signed)
Returning pt call. No answer left message to call back.

## 2016-03-23 NOTE — Telephone Encounter (Signed)
Please call patient / tg

## 2016-03-28 MED FILL — CARISOPRODOL 350 MG TABLET: 350 | 20 days supply | Qty: 60 | Fill #2

## 2016-03-28 MED FILL — CIPROFLOXACIN HCL 250 MG TA: 250 | 5 days supply | Qty: 10 | Fill #0

## 2016-03-28 MED FILL — PROMETHAZINE 25 MG TABLET: 25 | 3 days supply | Qty: 10 | Fill #0

## 2016-03-28 MED FILL — RANEXA ER 1,000 MG TABLET: 1000 | 90 days supply | Qty: 180 | Fill #1

## 2016-03-29 MED FILL — ALPRAZolam 0.5 MG TABS: 0.5 | 30 days supply | Qty: 60 | Fill #0

## 2016-03-30 MED FILL — GABAPENTIN 300 MG CAPSULE: 300 | 49 days supply | Qty: 90 | Fill #0

## 2016-04-21 DIAGNOSIS — R300 Dysuria: Secondary | ICD-10-CM | POA: Diagnosis not present

## 2016-04-21 MED FILL — MYRBETRIQ ER 50 MG TABLET: 50 | 30 days supply | Qty: 30 | Fill #0

## 2016-04-21 MED FILL — diazePAM 10 MG TABS: 10 | 30 days supply | Qty: 30 | Fill #0

## 2016-05-02 DIAGNOSIS — M6281 Muscle weakness (generalized): Secondary | ICD-10-CM | POA: Diagnosis not present

## 2016-05-02 DIAGNOSIS — M62838 Other muscle spasm: Secondary | ICD-10-CM | POA: Diagnosis not present

## 2016-05-02 DIAGNOSIS — R278 Other lack of coordination: Secondary | ICD-10-CM | POA: Diagnosis not present

## 2016-05-02 DIAGNOSIS — N3946 Mixed incontinence: Secondary | ICD-10-CM | POA: Diagnosis not present

## 2016-05-02 DIAGNOSIS — R159 Full incontinence of feces: Secondary | ICD-10-CM | POA: Diagnosis not present

## 2016-05-02 DIAGNOSIS — R195 Other fecal abnormalities: Secondary | ICD-10-CM | POA: Diagnosis not present

## 2016-05-12 MED FILL — CARISOPRODOL 350 MG TABLET: 350 | 20 days supply | Qty: 60 | Fill #0

## 2016-05-16 DIAGNOSIS — H43811 Vitreous degeneration, right eye: Secondary | ICD-10-CM | POA: Diagnosis not present

## 2016-05-16 DIAGNOSIS — T2661XA Corrosion of cornea and conjunctival sac, right eye, initial encounter: Secondary | ICD-10-CM | POA: Diagnosis not present

## 2016-05-16 MED FILL — TOBRAMYCIN-DEXAMETH OPTH SU: 0.3-0.1 | 25 days supply | Qty: 5 | Fill #0

## 2016-05-19 DIAGNOSIS — M6281 Muscle weakness (generalized): Secondary | ICD-10-CM | POA: Diagnosis not present

## 2016-05-19 DIAGNOSIS — R278 Other lack of coordination: Secondary | ICD-10-CM | POA: Diagnosis not present

## 2016-05-19 DIAGNOSIS — H43811 Vitreous degeneration, right eye: Secondary | ICD-10-CM | POA: Diagnosis not present

## 2016-05-19 DIAGNOSIS — T2661XD Corrosion of cornea and conjunctival sac, right eye, subsequent encounter: Secondary | ICD-10-CM | POA: Diagnosis not present

## 2016-05-19 DIAGNOSIS — N3946 Mixed incontinence: Secondary | ICD-10-CM | POA: Diagnosis not present

## 2016-05-19 DIAGNOSIS — M62838 Other muscle spasm: Secondary | ICD-10-CM | POA: Diagnosis not present

## 2016-05-30 DIAGNOSIS — M25552 Pain in left hip: Secondary | ICD-10-CM | POA: Diagnosis not present

## 2016-05-30 DIAGNOSIS — R278 Other lack of coordination: Secondary | ICD-10-CM | POA: Diagnosis not present

## 2016-05-30 DIAGNOSIS — L905 Scar conditions and fibrosis of skin: Secondary | ICD-10-CM | POA: Diagnosis not present

## 2016-05-30 DIAGNOSIS — M62838 Other muscle spasm: Secondary | ICD-10-CM | POA: Diagnosis not present

## 2016-05-30 DIAGNOSIS — M6281 Muscle weakness (generalized): Secondary | ICD-10-CM | POA: Diagnosis not present

## 2016-05-30 MED FILL — ALPRAZolam 0.5 MG TABS: 0.5 | 30 days supply | Qty: 60 | Fill #1

## 2016-06-06 ENCOUNTER — Other Ambulatory Visit (INDEPENDENT_AMBULATORY_CARE_PROVIDER_SITE_OTHER): Payer: Self-pay | Admitting: Internal Medicine

## 2016-06-06 DIAGNOSIS — R278 Other lack of coordination: Secondary | ICD-10-CM | POA: Diagnosis not present

## 2016-06-06 DIAGNOSIS — N3946 Mixed incontinence: Secondary | ICD-10-CM | POA: Diagnosis not present

## 2016-06-06 DIAGNOSIS — M62838 Other muscle spasm: Secondary | ICD-10-CM | POA: Diagnosis not present

## 2016-06-06 DIAGNOSIS — L905 Scar conditions and fibrosis of skin: Secondary | ICD-10-CM | POA: Diagnosis not present

## 2016-06-06 DIAGNOSIS — M25552 Pain in left hip: Secondary | ICD-10-CM | POA: Diagnosis not present

## 2016-06-06 DIAGNOSIS — M6281 Muscle weakness (generalized): Secondary | ICD-10-CM | POA: Diagnosis not present

## 2016-06-10 DIAGNOSIS — L600 Ingrowing nail: Secondary | ICD-10-CM | POA: Diagnosis not present

## 2016-06-17 ENCOUNTER — Encounter: Payer: Self-pay | Admitting: Cardiovascular Disease

## 2016-06-17 ENCOUNTER — Ambulatory Visit (INDEPENDENT_AMBULATORY_CARE_PROVIDER_SITE_OTHER): Payer: 59 | Admitting: Cardiovascular Disease

## 2016-06-17 VITALS — BP 104/62 | HR 73 | Ht 68.0 in | Wt 146.0 lb

## 2016-06-17 DIAGNOSIS — I25118 Atherosclerotic heart disease of native coronary artery with other forms of angina pectoris: Secondary | ICD-10-CM | POA: Diagnosis not present

## 2016-06-17 DIAGNOSIS — R079 Chest pain, unspecified: Secondary | ICD-10-CM

## 2016-06-17 DIAGNOSIS — Z79899 Other long term (current) drug therapy: Secondary | ICD-10-CM

## 2016-06-17 DIAGNOSIS — I83893 Varicose veins of bilateral lower extremities with other complications: Secondary | ICD-10-CM | POA: Diagnosis not present

## 2016-06-17 MED ORDER — ISOSORBIDE DINITRATE 10 MG PO TABS: ORAL_TABLET | ORAL | 2 refills | Status: DC

## 2016-06-17 NOTE — Progress Notes (Signed)
SUBJECTIVE: [name] presents for follow-up for varicose veins, chest pain, and coronary disease. She also has a history of migraine headaches.  She was bothered by headaches and nausea with isosorbide dinitrate and is taking Ranexa 1000 mg bid.   She also has a history of Raynaud's phenomenon as well, with cold nose, hands, and feet.   She has complaints about chest pain. Says she has been taking a lot of nitroglycerin. Does not occur with exertion. Also complains of insomnia and irritability, bilateral leg pain, and back pain.   Review of Systems: As per "subjective", otherwise negative.  Allergies  Allergen Reactions  . Dilaudid [Hydromorphone Hcl] Other (See Comments)    Resp. arrest  . Codeine Nausea And Vomiting and Rash  . Morphine And Related Nausea And Vomiting    Current Outpatient Prescriptions  Medication Sig Dispense Refill  . acetaminophen (TYLENOL) 325 MG tablet Take 650 mg by mouth every 6 (six) hours as needed for mild pain.    Marland Kitchen ALPRAZolam (XANAX) 0.5 MG tablet Take 0.5 mg by mouth 2 (two) times daily as needed. anxiety  0  . Ascorbic Acid (VITAMIN C PO) Take 2 tablets by mouth daily.    Marland Kitchen aspirin EC 81 MG EC tablet Take 1 tablet (81 mg total) by mouth daily.    Marland Kitchen atorvastatin (LIPITOR) 40 MG tablet Take 1 tablet (40 mg total) by mouth daily. 90 tablet 1  . Biotin (BIOTIN 5000) 5 MG CAPS Take 1 capsule by mouth daily.    . Calcium Citrate-Vitamin D (CALCITRATE/VITAMIN D PO) Take 1 tablet by mouth daily.    . carisoprodol (SOMA) 350 MG tablet Take 350 mg by mouth 2 (two) times daily.    . celecoxib (CELEBREX) 200 MG capsule Take 200 mg by mouth daily.    Marland Kitchen docusate sodium (COLACE) 100 MG capsule Take 100 mg by mouth daily as needed for mild constipation.    . gabapentin (NEURONTIN) 300 MG capsule Take 300 mg by mouth 2 (two) times daily.    Marland Kitchen HYDROcodone-acetaminophen (NORCO) 5-325 MG tablet Take 1-2 tablets by mouth every 4 (four) hours as needed for  moderate pain. 60 tablet 0  . meloxicam (MOBIC) 15 MG tablet Take 15 mg by mouth daily.    . naproxen sodium (ANAPROX) 220 MG tablet Take 220 mg by mouth 2 (two) times daily as needed (pain).    Marland Kitchen NITROSTAT 0.4 MG SL tablet PLACE 1 TABLET UNDER THE TONGUE EVERY 5 MINUTES AS NEEDED FOR CHEST PAIN 100 tablet 1  . pantoprazole (PROTONIX) 40 MG tablet TAKE 1 TABLET BY MOUTH ONCE DAILY 30 tablet 5  . promethazine (PHENERGAN) 12.5 MG tablet Take 1 tablet (12.5 mg total) by mouth every 6 (six) hours as needed for nausea or vomiting. 20 tablet 0  . promethazine (PHENERGAN) 25 MG tablet Take 25 mg by mouth every 6 (six) hours as needed for nausea or vomiting.    . ranolazine (RANEXA) 1000 MG SR tablet Take 1 tablet (1,000 mg total) by mouth 2 (two) times daily. 180 tablet 3  . Vitamin D, Ergocalciferol, (DRISDOL) 50000 units CAPS capsule Take 50,000 Units by mouth daily.     No current facility-administered medications for this visit.     Past Medical History:  Diagnosis Date  . Anemia    hx  . Anxiety   . CAD (coronary artery disease)    a. 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 05/2013 Lexi CL: EF 77%, old small inferolat scar, no ischemia.  . Depression   . Fibromyalgia   . GERD (gastroesophageal reflux disease)    hasn't started taking any meds  . History of blood transfusion    5yrs ago  . Hx of colonic polyps   . IBS (irritable bowel syndrome)    constipation  . Joint pain   . Joint swelling   . Kidney stones 03/2010  . Migraine    "q other week" (02/04/2016)  . Myocardial infarction ([place])   . Numbness    right leg  . Polycythemia   . PONV (postoperative nausea and vomiting)   . Raynaud's disease   . Scoliosis   . Shortness of breath  dyspnea    with exertion    Past Surgical History:  Procedure Laterality Date  . ABDOMINAL HYSTERECTOMY     partial  . APPENDECTOMY     with explor. lap.  Marland Kitchen CARDIAC CATHETERIZATION    . CHOLECYSTECTOMY  10/28/2002   lap.  . COLONOSCOPY    . DEBRIDEMENT TENNIS ELBOW    . DIAGNOSTIC LAPAROSCOPY    . DILATION AND CURETTAGE OF UTERUS    . ELBOW SURGERY     golfer's elbow-bilateral  . ESOPHAGOGASTRODUODENOSCOPY  09/10/2012   Procedure: ESOPHAGOGASTRODUODENOSCOPY (EGD);  Surgeon: Rogene Houston, MD;  Location: [facility];  Service: Endoscopy;  Laterality: N/A;  730  . EXCISION/RELEASE BURSA HIP Left 02/04/2016   Procedure: LEFT HIP TROCHANTERIC BURSECTOMY;  Surgeon: Mcarthur Rossetti, MD;  Location: [facility];  Service: Orthopedics;  Laterality: Left;  . I&D EXTREMITY  02/25/2012   Procedure: IRRIGATION AND DEBRIDEMENT EXTREMITY;  Surgeon: Roseanne Kaufman, MD;  Location: [facility];  Service: Orthopedics;  Laterality: Right;  Irrigation and Debridement and Repair Right Thumb Nerve Laceration and Assoiated Structures   . KNEE DEBRIDEMENT     right  . LAPAROTOMY    . LEFT HEART CATHETERIZATION WITH CORONARY ANGIOGRAM N/A 01/16/2013   Procedure: LEFT HEART CATHETERIZATION WITH CORONARY ANGIOGRAM;  Surgeon: Jolaine Artist, MD;  Location: [facility] CATH LAB;  Service: Cardiovascular;  Laterality: N/A;  . STAPEDECTOMY     right  . TONSILLECTOMY AND ADENOIDECTOMY    . TRIGGER FINGER RELEASE  08/19/2011   Procedure: RELEASE TRIGGER FINGER/A-1 PULLEY;  Surgeon: Willa Frater III;  Location: [facility];  Service: Orthopedics;  Laterality: Right;  right middle finger a-1 pulley release with tenosynovectomy  . TROCHANTERIC BURSA EXCISION Right 02/04/2016  . TUBAL LIGATION    . TYMPANOPLASTY      Social History   Social History  . Marital status: Married    Spouse name: N/A  . Number of children: N/A  . Years of education: N/A   Occupational History  . Not on file.   Social  History Main Topics  . Smoking status: Never Smoker  . Smokeless tobacco: Never Used  . Alcohol use 0.0 oz/week     Comment: rarely  . Drug use: No  . Sexual activity: Yes    Birth control/ protection: Surgical   Other Topics Concern  . Not on file   Social History Narrative   Lives in [place] with husband.  Grown children.  Does not routinely  exercise.  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     Vitals:   06/17/16 0817  BP: 104/62  Pulse: 73  SpO2: 97%  Weight: 146 lb (66.2 kg)  Height: 5\' 8"  (1.727 m)    PHYSICAL EXAM General: NAD HEENT: Normal. Neck: No JVD, no thyromegaly. Lungs: Clear to auscultation bilaterally with normal respiratory effort. CV: Nondisplaced PMI.  Regular rate and rhythm, normal S1/S2, no S3/S4, no murmur. No pretibial or periankle edema.    Abdomen: Soft, nontender, no distention.  Neurologic: Alert and oriented.  Psych: Normal affect. Skin: Normal. Musculoskeletal: No gross deformities.    ECG: Most recent ECG reviewed.      ASSESSMENT AND PLAN: 1. CAD with chest pain: Frequent chest pains, non-exertional, doubt cardiac in etiology. Likely related to depression. Have recommended behavioral therapy.  However, will add isosorbide dinitrate 10 mg q pm and instructed her she can take an extra 10 mg q am. Continue Ranexa 1000 mg bid. She is not on beta blockers due to prior issues with hypotension. Continue ASA and Lipitor 40 mg.   2. Venous varicosities: Encouraged to wear compression stockings.   3. Hyperlipidemia: Continue Lipitor 40 mg.  Dispo: fu 1 year.   Kate Sable, M.D., F.A.C.C.

## 2016-06-17 NOTE — Patient Instructions (Addendum)
Your physician wants you to follow-up in: 1 year You will receive a reminder letter in the mail two months in advance. If you don't receive a letter, please call our office to schedule the follow-up appointment.    Take Isosorbide 10 mg at night, you may take it again at 8 am       Thank you for choosing [facility] !

## 2016-06-20 DIAGNOSIS — M6281 Muscle weakness (generalized): Secondary | ICD-10-CM | POA: Diagnosis not present

## 2016-06-20 DIAGNOSIS — N3946 Mixed incontinence: Secondary | ICD-10-CM | POA: Diagnosis not present

## 2016-06-20 DIAGNOSIS — R278 Other lack of coordination: Secondary | ICD-10-CM | POA: Diagnosis not present

## 2016-06-20 DIAGNOSIS — M62838 Other muscle spasm: Secondary | ICD-10-CM | POA: Diagnosis not present

## 2016-06-20 DIAGNOSIS — R159 Full incontinence of feces: Secondary | ICD-10-CM | POA: Diagnosis not present

## 2016-06-21 ENCOUNTER — Telehealth: Payer: Self-pay | Admitting: Cardiovascular Disease

## 2016-06-21 NOTE — Telephone Encounter (Signed)
Pt called stating she'd like to speak w/ Cathey. [name] is needing a letter from Dr. Bronson Ing & she'd like to have Delmar give her a call @ [phone]

## 2016-06-22 NOTE — Telephone Encounter (Signed)
Pt states that she needs a form filled out for behavioral health. I will contact them and figure out what form it is and go from there.

## 2016-06-23 NOTE — Telephone Encounter (Signed)
Behavioral Health will fax referral form to office to be placed on Dr. Court Joy desk.

## 2016-06-24 DIAGNOSIS — Z6822 Body mass index (BMI) 22.0-22.9, adult: Secondary | ICD-10-CM | POA: Diagnosis not present

## 2016-06-24 DIAGNOSIS — M25559 Pain in unspecified hip: Secondary | ICD-10-CM | POA: Diagnosis not present

## 2016-06-24 DIAGNOSIS — F430 Acute stress reaction: Secondary | ICD-10-CM | POA: Diagnosis not present

## 2016-06-24 DIAGNOSIS — F429 Obsessive-compulsive disorder, unspecified: Secondary | ICD-10-CM | POA: Diagnosis not present

## 2016-06-24 DIAGNOSIS — G43001 Migraine without aura, not intractable, with status migrainosus: Secondary | ICD-10-CM | POA: Diagnosis not present

## 2016-06-24 DIAGNOSIS — Z23 Encounter for immunization: Secondary | ICD-10-CM | POA: Diagnosis not present

## 2016-06-24 DIAGNOSIS — F39 Unspecified mood [affective] disorder: Secondary | ICD-10-CM | POA: Diagnosis not present

## 2016-06-24 DIAGNOSIS — G4700 Insomnia, unspecified: Secondary | ICD-10-CM | POA: Diagnosis not present

## 2016-06-27 DIAGNOSIS — M25552 Pain in left hip: Secondary | ICD-10-CM | POA: Diagnosis not present

## 2016-06-27 DIAGNOSIS — M62838 Other muscle spasm: Secondary | ICD-10-CM | POA: Diagnosis not present

## 2016-06-27 DIAGNOSIS — R102 Pelvic and perineal pain: Secondary | ICD-10-CM | POA: Diagnosis not present

## 2016-06-27 DIAGNOSIS — R278 Other lack of coordination: Secondary | ICD-10-CM | POA: Diagnosis not present

## 2016-06-27 DIAGNOSIS — M6281 Muscle weakness (generalized): Secondary | ICD-10-CM | POA: Diagnosis not present

## 2016-06-27 DIAGNOSIS — R195 Other fecal abnormalities: Secondary | ICD-10-CM | POA: Diagnosis not present

## 2016-06-27 MED FILL — diazePAM 10 MG TABS: 10 | 30 days supply | Qty: 30 | Fill #1

## 2016-06-27 MED FILL — PANTOPRAZOLE SOD DR 40 MG T: 40 | 90 days supply | Qty: 90 | Fill #0

## 2016-06-27 MED FILL — ISOSORBIDE DN 10 MG TABLET: 10 | 45 days supply | Qty: 90 | Fill #0

## 2016-07-04 ENCOUNTER — Encounter (HOSPITAL_COMMUNITY): Payer: Self-pay | Admitting: Emergency Medicine

## 2016-07-04 ENCOUNTER — Emergency Department (HOSPITAL_COMMUNITY)
Admission: EM | Admit: 2016-07-04 | Discharge: 2016-07-04 | Disposition: A | Discharge status: Home / Self Care | Payer: 59 | Attending: Emergency Medicine | Admitting: Emergency Medicine

## 2016-07-04 ENCOUNTER — Emergency Department (HOSPITAL_COMMUNITY): Payer: 59

## 2016-07-04 DIAGNOSIS — Z7982 Long term (current) use of aspirin: Secondary | ICD-10-CM | POA: Diagnosis not present

## 2016-07-04 DIAGNOSIS — S6992XA Unspecified injury of left wrist, hand and finger(s), initial encounter: Secondary | ICD-10-CM | POA: Diagnosis present

## 2016-07-04 DIAGNOSIS — S60212A Contusion of left wrist, initial encounter: Secondary | ICD-10-CM | POA: Insufficient documentation

## 2016-07-04 DIAGNOSIS — Z79899 Other long term (current) drug therapy: Secondary | ICD-10-CM | POA: Insufficient documentation

## 2016-07-04 DIAGNOSIS — Y929 Unspecified place or not applicable: Secondary | ICD-10-CM | POA: Insufficient documentation

## 2016-07-04 DIAGNOSIS — Y939 Activity, unspecified: Secondary | ICD-10-CM | POA: Insufficient documentation

## 2016-07-04 DIAGNOSIS — Y999 Unspecified external cause status: Secondary | ICD-10-CM | POA: Diagnosis not present

## 2016-07-04 DIAGNOSIS — W11XXXA Fall on and from ladder, initial encounter: Secondary | ICD-10-CM | POA: Diagnosis not present

## 2016-07-04 DIAGNOSIS — I2510 Atherosclerotic heart disease of native coronary artery without angina pectoris: Secondary | ICD-10-CM | POA: Diagnosis not present

## 2016-07-04 DIAGNOSIS — S60222A Contusion of left hand, initial encounter: Secondary | ICD-10-CM | POA: Diagnosis not present

## 2016-07-04 MED ORDER — BACITRACIN ZINC 500 UNIT/GM EX OINT
TOPICAL_OINTMENT | CUTANEOUS | Status: AC
  Filled 2016-07-04: qty 0.9

## 2016-07-04 MED ORDER — HYDROCODONE-ACETAMINOPHEN 5-325 MG PO TABS
2.0000 | ORAL_TABLET | Freq: Once | ORAL | Status: AC
  Administered 2016-07-04: 2 via ORAL
  Filled 2016-07-04: qty 2

## 2016-07-04 MED ORDER — PROMETHAZINE HCL 12.5 MG PO TABS
12.5000 mg | ORAL_TABLET | Freq: Once | ORAL | Status: AC
  Administered 2016-07-04: 12.5 mg via ORAL
  Filled 2016-07-04: qty 1

## 2016-07-04 MED FILL — CARISOPRODOL 350 MG TABLET: 350 | 20 days supply | Qty: 60 | Fill #1

## 2016-07-04 NOTE — ED Notes (Signed)
Applied dressing to left hand and watson jones splint, pt instructed to keep area clean and dry; pt given discharge instructions, unable to sign due to signature pads not working in room or at nurses station; pt discharged via wheelchair, pt verbalizes understanding of discharge instructions

## 2016-07-04 NOTE — Discharge Instructions (Signed)
The x-ray of your wrist and hand are negative for fracture or dislocation. You have soft tissue swelling present. Please cleanse the broken skin areas daily with soap and water. Apply nonstick dressing, as well as Ace wrap on. Use your sling to assist with pain and elevation. May continue your current pain medication. Please see Dr. Aline Brochure for orthopedic evaluation concerning your injury.

## 2016-07-04 NOTE — ED Provider Notes (Signed)
[facility] DEPT Provider Note   CSN: [id number] Arrival date & time: 07/04/16  1802  By signing my name below, I, Royce Macadamia, attest that this documentation has been prepared under the direction and in the presence of  Lily Kocher, PA-C. Electronically Signed: Royce Macadamia, ED Scribe. 07/04/16. 6:45 PM.  History   Chief Complaint Chief Complaint  Patient presents with  . Wrist Pain   The history is provided by the patient and medical records. No language interpreter was used.     HPI Comments:  Stacey Lawrence is a 65 y.o. female who presents to the Emergency Department s/p fall just PTA complaining pain, swelling, bruising and deformity in the left wrist.  Pt was on an extension ladder that fell and her left hand was caught between two rungs.  She denies taking anticoagulants and history of bleeding disorders.  She denies additional injury or complaint.   Pt had pain medication before coming to the ED and denies pain medication at this time.     Past Medical History:  Diagnosis Date  . Anemia    hx  . Anxiety   . CAD (coronary artery disease)    a. 12/2012 NSTEMI/Cath: LM anomalous, arising in R cusp ant to RCA, otw nl, LAD 5m with bridge, RI nl, LCX nl, OM2 small, subtl occl w/ thrombus distally - felt to be spont dissection, too small for PCI->Med Rx, RCA large, dom, nl, PDA/PD nl, EF 55% w/ focal HK in mid-dist antlat wall;  b. 05/2013 Lexi CL: EF 77%, old small inferolat scar, no ischemia.  . Depression   . Fibromyalgia   . GERD (gastroesophageal reflux disease)    hasn't started taking any meds  . History of blood transfusion    79yrs ago  . Hx of colonic polyps   . IBS (irritable bowel syndrome)    constipation  . Joint pain   . Joint swelling   . Kidney stones 03/2010  . Migraine    "q other week" (02/04/2016)  . Myocardial infarction   . Numbness    right leg  . Polycythemia   . PONV (postoperative nausea and vomiting)   . Raynaud's  disease   . Scoliosis   . Shortness of breath dyspnea    with exertion    Patient Active Problem List   Diagnosis Date Noted  . Trochanteric bursitis of left hip 02/04/2016  . Trochanteric bursitis of both hips 10/31/2015  . Chest pain at rest 06/17/2015  . Inferior myocardial infarction ([place]) 06/17/2015  . Sprain of interphalangeal joint of left little finger 02/05/2014  . Pain in joint, hand 02/05/2014  . Midsternal chest pain 06/04/2013  . CAD (coronary artery disease)   . Fibromyalgia   . GERD (gastroesophageal reflux disease)   . Migraine headache   . Acute non-STEMI < 8 weeks prior, subsequent admission after initial 01/16/2013  . Radial nerve laceration 03/27/2012  . Lack of coordination 03/27/2012  . Muscle weakness (generalized) 03/27/2012    Past Surgical History:  Procedure Laterality Date  . ABDOMINAL HYSTERECTOMY     partial  . APPENDECTOMY     with explor. lap.  Marland Kitchen CARDIAC CATHETERIZATION    . CHOLECYSTECTOMY  10/28/2002   lap.  . COLONOSCOPY    . DEBRIDEMENT TENNIS ELBOW    . DIAGNOSTIC LAPAROSCOPY    . DILATION AND CURETTAGE OF UTERUS    . ELBOW SURGERY     golfer's elbow-bilateral  . ESOPHAGOGASTRODUODENOSCOPY  09/10/2012   Procedure:  ESOPHAGOGASTRODUODENOSCOPY (EGD);  Surgeon: Rogene Houston, MD;  [facility];  Service: Endoscopy;  Laterality: N/A;  730  . EXCISION/RELEASE BURSA HIP Left 02/04/2016   Procedure: LEFT HIP TROCHANTERIC BURSECTOMY;  Surgeon: Mcarthur Rossetti, MD;  Location: [facility];  Service: Orthopedics;  Laterality: Left;  . I&D EXTREMITY  02/25/2012   Procedure: IRRIGATION AND DEBRIDEMENT EXTREMITY;  Surgeon: Roseanne Kaufman, MD;  Location: [facility];  Service: Orthopedics;  Laterality: Right;  Irrigation and Debridement and Repair Right Thumb Nerve Laceration and Assoiated Structures   . KNEE DEBRIDEMENT     right  . LAPAROTOMY    . LEFT HEART CATHETERIZATION WITH CORONARY ANGIOGRAM N/A 01/16/2013   Procedure: LEFT HEART  CATHETERIZATION WITH CORONARY ANGIOGRAM;  Surgeon: Jolaine Artist, MD;  Location: [facility] CATH LAB;  Service: Cardiovascular;  Laterality: N/A;  . STAPEDECTOMY     right  . TONSILLECTOMY AND ADENOIDECTOMY    . TRIGGER FINGER RELEASE  08/19/2011   Procedure: RELEASE TRIGGER FINGER/A-1 PULLEY;  Surgeon: Willa Frater III;  Location: [place] [facility];  Service: Orthopedics;  Laterality: Right;  right middle finger a-1 pulley release with tenosynovectomy  . TROCHANTERIC BURSA EXCISION Right 02/04/2016  . TUBAL LIGATION    . TYMPANOPLASTY      OB History    No data available       Home Medications    Prior to Admission medications   Medication Sig Start Date End Date Taking? Authorizing Provider  acetaminophen (TYLENOL) 325 MG tablet Take 650 mg by mouth every 6 (six) hours as needed for mild pain.    Historical Provider, MD  ALPRAZolam Duanne Moron) 0.5 MG tablet Take 0.5 mg by mouth 2 (two) times daily as needed. anxiety 12/23/15   Historical Provider, MD  Ascorbic Acid (VITAMIN C PO) Take 2 tablets by mouth daily.    Historical Provider, MD  aspirin EC 81 MG EC tablet Take 1 tablet (81 mg total) by mouth daily. 01/18/13   Rogelia Mire, NP  atorvastatin (LIPITOR) 40 MG tablet Take 1 tablet (40 mg total) by mouth daily. 03/03/16   Herminio Commons, MD  Biotin (BIOTIN 5000) 5 MG CAPS Take 1 capsule by mouth daily.    Historical Provider, MD  Calcium Citrate-Vitamin D (CALCITRATE/VITAMIN D PO) Take 1 tablet by mouth daily.    Historical Provider, MD  carisoprodol (SOMA) 350 MG tablet Take 350 mg by mouth 2 (two) times daily.    Historical Provider, MD  celecoxib (CELEBREX) 200 MG capsule Take 200 mg by mouth daily.    Historical Provider, MD  docusate sodium (COLACE) 100 MG capsule Take 100 mg by mouth daily as needed for mild constipation.    Historical Provider, MD  gabapentin (NEURONTIN) 300 MG capsule Take 300 mg by mouth 2 (two) times daily.    Historical Provider, MD    HYDROcodone-acetaminophen (NORCO) 5-325 MG tablet Take 1-2 tablets by mouth every 4 (four) hours as needed for moderate pain. 02/04/16   Mcarthur Rossetti, MD  isosorbide dinitrate (ISORDIL) 10 MG tablet Take 10 mg at night,may take additional 10 mg at 8 am 06/17/16   Herminio Commons, MD  meloxicam (MOBIC) 15 MG tablet Take 15 mg by mouth daily.    Historical Provider, MD  naproxen sodium (ANAPROX) 220 MG tablet Take 220 mg by mouth 2 (two) times daily as needed (pain).    Historical Provider, MD  NITROSTAT 0.4 MG SL tablet PLACE 1 TABLET UNDER THE TONGUE  EVERY 5 MINUTES AS NEEDED FOR CHEST PAIN 03/06/15   Herminio Commons, MD  pantoprazole (PROTONIX) 40 MG tablet TAKE 1 TABLET BY MOUTH ONCE DAILY 06/06/16   Rogene Houston, MD  promethazine (PHENERGAN) 12.5 MG tablet Take 1 tablet (12.5 mg total) by mouth every 6 (six) hours as needed for nausea or vomiting. 02/04/16   Mcarthur Rossetti, MD  promethazine (PHENERGAN) 25 MG tablet Take 25 mg by mouth every 6 (six) hours as needed for nausea or vomiting.    Historical Provider, MD  ranolazine (RANEXA) 1000 MG SR tablet Take 1 tablet (1,000 mg total) by mouth 2 (two) times daily. 01/08/16   Herminio Commons, MD  Vitamin D, Ergocalciferol, (DRISDOL) 50000 units CAPS capsule Take 50,000 Units by mouth daily.    Historical Provider, MD    Family History Family History  Problem Relation Age of Onset  . Cancer Mother     Deceased with lung CA  . Heart failure Mother   . Heart disease Mother   . COPD Mother   . Varicose Veins Mother   . Cancer Father     Deceased with lung CA  . Heart disease Father   . Varicose Veins Father     Social History Social History  Substance Use Topics  . Smoking status: Never Smoker  . Smokeless tobacco: Never Used  . Alcohol use 0.0 oz/week     Comment: rarely     Allergies   Dilaudid [hydromorphone hcl]; Codeine; and Morphine and related   Review of Systems Review of Systems   Musculoskeletal: Positive for arthralgias and myalgias.  Skin: Positive for color change and wound.  All other systems reviewed and are negative.    Physical Exam Updated Vital Signs BP 151/68 (BP Location: Right Arm)   Pulse (!) 59   Temp 98.5 F (36.9 C) (Oral)   Resp 17   Ht 5\' 8"  (1.727 m)   Wt 145 lb (65.8 kg)   SpO2 100%   BMI 22.05 kg/m   Physical Exam  Constitutional: She is oriented to person, place, and time. She appears well-developed and well-nourished. No distress.  HENT:  Head: Normocephalic and atraumatic.  Eyes: Conjunctivae are normal.  Cardiovascular: Normal rate.   Pulses:      Radial pulses are 2+ on the left side.  Pulmonary/Chest: Effort normal.  Musculoskeletal: She exhibits tenderness and deformity.  Good ROM of the left shoulder.  No crepitus. No deformity of the clavicle.  No displacement of the scapula.  No hematoma of the bicep-tricep area.  No deformity of the humerus.  No effusion of the elbow.  Visible deformity of the wrist.  Full ROM of the fingers.    Neurological: She is alert and oriented to person, place, and time.  Skin: Skin is warm and dry. Capillary refill takes less than 2 seconds.  Psychiatric: She has a normal mood and affect.  Nursing note and vitals reviewed.    ED Treatments / Results   DIAGNOSTIC STUDIES:  Oxygen Saturation is 100% on RA, NML by my interpretation.    COORDINATION OF CARE:  6:40 PM Will look over X-rays and order ice pack.  Discussed treatment plan with pt at bedside and pt agreed to plan.  Labs (all labs ordered are listed, but only abnormal results are displayed) Labs Reviewed - No data to display  EKG  EKG Interpretation None       Radiology No results found.  Procedures Procedures (including critical  care time)  Medications Ordered in ED Medications - No data to display   Initial Impression / Assessment and Plan / ED Course  I have reviewed the triage vital signs and the nursing  notes.  Pertinent labs & imaging results that were available during my care of the patient were reviewed by me and considered in my medical decision making (see chart for details).  Clinical Course    **I have reviewed nursing notes, vital signs, and all appropriate lab and imaging results for this patient.* Patient X-Ray negative for obvious fracture or dislocation.  Pt advised to follow up with orthopedics. Patient given Doran Durand splint while in ED, conservative therapy recommended and discussed. Patient will be discharged home & is agreeable with above plan. Returns precautions discussed. Pt appears safe for discharge.   Final Clinical Impressions(s) / ED Diagnoses   Final diagnoses:  Contusion of left wrist, initial encounter    New Prescriptions Discharge Medication List as of 07/04/2016  7:24 PM     **I personally performed the services described in this documentation, which was scribed in my presence. The recorded information has been reviewed and is accurate.Lily Kocher, PA-C 07/05/16 1615    Merrily Pew, MD 07/07/16 [phone]

## 2016-07-04 NOTE — ED Triage Notes (Signed)
Pt on extension ladder and it came down and got left hand caught in it. Obvious deformity to left wrist. Radial pulse presnt. Had narcotic at 5pm

## 2016-07-05 DIAGNOSIS — R278 Other lack of coordination: Secondary | ICD-10-CM | POA: Diagnosis not present

## 2016-07-05 DIAGNOSIS — L905 Scar conditions and fibrosis of skin: Secondary | ICD-10-CM | POA: Diagnosis not present

## 2016-07-05 DIAGNOSIS — M6281 Muscle weakness (generalized): Secondary | ICD-10-CM | POA: Diagnosis not present

## 2016-07-05 DIAGNOSIS — N3946 Mixed incontinence: Secondary | ICD-10-CM | POA: Diagnosis not present

## 2016-07-05 DIAGNOSIS — R102 Pelvic and perineal pain: Secondary | ICD-10-CM | POA: Diagnosis not present

## 2016-07-06 DIAGNOSIS — S6722XA Crushing injury of left hand, initial encounter: Secondary | ICD-10-CM | POA: Diagnosis not present

## 2016-07-13 DIAGNOSIS — S6722XD Crushing injury of left hand, subsequent encounter: Secondary | ICD-10-CM | POA: Diagnosis not present

## 2016-07-20 DIAGNOSIS — S6722XD Crushing injury of left hand, subsequent encounter: Secondary | ICD-10-CM | POA: Diagnosis not present

## 2016-07-20 MED FILL — ATORVASTATIN 40 MG TABLET: 40 | 90 days supply | Qty: 90 | Fill #1

## 2016-07-25 ENCOUNTER — Ambulatory Visit (INDEPENDENT_AMBULATORY_CARE_PROVIDER_SITE_OTHER): Payer: Self-pay | Admitting: Physician Assistant
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
## 2016-07-26 ENCOUNTER — Ambulatory Visit (INDEPENDENT_AMBULATORY_CARE_PROVIDER_SITE_OTHER): Payer: 59 | Admitting: Psychiatry

## 2016-07-26 ENCOUNTER — Encounter (HOSPITAL_COMMUNITY): Payer: Self-pay | Admitting: Psychiatry

## 2016-07-26 VITALS — BP 135/78 | HR 65 | Ht 68.0 in | Wt 143.4 lb

## 2016-07-26 DIAGNOSIS — Z888 Allergy status to other drugs, medicaments and biological substances status: Secondary | ICD-10-CM

## 2016-07-26 DIAGNOSIS — R45851 Suicidal ideations: Secondary | ICD-10-CM

## 2016-07-26 DIAGNOSIS — Z8249 Family history of ischemic heart disease and other diseases of the circulatory system: Secondary | ICD-10-CM

## 2016-07-26 DIAGNOSIS — F331 Major depressive disorder, recurrent, moderate: Secondary | ICD-10-CM

## 2016-07-26 DIAGNOSIS — Z79899 Other long term (current) drug therapy: Secondary | ICD-10-CM

## 2016-07-26 DIAGNOSIS — Z801 Family history of malignant neoplasm of trachea, bronchus and lung: Secondary | ICD-10-CM | POA: Diagnosis not present

## 2016-07-26 DIAGNOSIS — Z8489 Family history of other specified conditions: Secondary | ICD-10-CM | POA: Diagnosis not present

## 2016-07-26 MED ORDER — TRAZODONE HCL 50 MG PO TABS: ORAL_TABLET | ORAL | 0 refills | Status: DC

## 2016-07-26 MED ORDER — DULOXETINE HCL 30 MG PO CPEP: ORAL_CAPSULE | ORAL | 0 refills | Status: DC

## 2016-07-26 MED FILL — DULoxetine HCL 30 MG CPEP: 30 | 30 days supply | Qty: 60 | Fill #0

## 2016-07-26 MED FILL — ALPRAZolam 0.5 MG TABS: 0.5 | 30 days supply | Qty: 60 | Fill #0

## 2016-07-26 MED FILL — traZODone HCL 50 MG TABS: 50 | 8 days supply | Qty: 30 | Fill #0

## 2016-07-26 NOTE — Patient Instructions (Addendum)
1. Start duloxetine 30 mg daily for two weeks, then 60 mg daily 2. Discontinue Xanax 3. Start Trazodone 25 mg at night as needed  for insomnia (may increase up to 50 mg or 100 mg if it does not work) 4. You will be referred for therapy 5. Return to clinic in one month

## 2016-07-27 ENCOUNTER — Encounter (INDEPENDENT_AMBULATORY_CARE_PROVIDER_SITE_OTHER): Payer: Self-pay | Admitting: Physician Assistant

## 2016-07-27 ENCOUNTER — Ambulatory Visit (INDEPENDENT_AMBULATORY_CARE_PROVIDER_SITE_OTHER): Payer: 59 | Admitting: Orthopaedic Surgery

## 2016-07-27 DIAGNOSIS — M7062 Trochanteric bursitis, left hip: Secondary | ICD-10-CM | POA: Diagnosis not present

## 2016-07-27 DIAGNOSIS — M6281 Muscle weakness (generalized): Secondary | ICD-10-CM | POA: Diagnosis not present

## 2016-07-27 DIAGNOSIS — M62838 Other muscle spasm: Secondary | ICD-10-CM | POA: Diagnosis not present

## 2016-07-27 DIAGNOSIS — N3946 Mixed incontinence: Secondary | ICD-10-CM | POA: Diagnosis not present

## 2016-07-27 DIAGNOSIS — R278 Other lack of coordination: Secondary | ICD-10-CM | POA: Diagnosis not present

## 2016-07-27 DIAGNOSIS — R102 Pelvic and perineal pain: Secondary | ICD-10-CM | POA: Diagnosis not present

## 2016-07-27 NOTE — Progress Notes (Signed)
Office Visit Note   Patient: Stacey Lawrence           Date of Birth: 12-01-1950           MRN: [id number] Visit Date: 07/27/2016              Requested by: Celene Squibb, MD [address] [place], [place] [address] PCP: Wende Neighbors, MD   Assessment & Plan: Visit Diagnoses:  1. Trochanteric bursitis, left hip     Plan: At this point is really nothing else to offer her. This is certainly a chronic pain syndrome and hopefully will burn itself out with time. She is seeing a specialist therapist for pelvic floor issues and this is to [facility]. Hopefully this will be helpful for her. She'll follow-up when necessary.  Follow-Up Instructions: Return if symptoms worsen or fail to improve.   Orders:  No orders of the defined types were placed in this encounter.  No orders of the defined types were placed in this encounter.     Procedures: No procedures performed   Clinical Data: No additional findings.   Subjective: Chief Complaint  Patient presents with  . Left Hip - Follow-up    S/p Left Troch Bursectomy on 02/04/2016    [name] is here to follow up from Left Troch Bursectomy.  She is 5 and a half months post surgery. She has not got any relief from the surgery. She is doing physical therapy at [facility].      Review of Systems  Negative for chest pain, shortness of breath, fever, chills, nausea, vomiting Objective: Vital Signs: There were no vitals taken for this visit.  Physical Exam She is alert and oriented 3. Ortho Exam She has fluid range of motion of her left hip with no difficulties and motion. She is still point tender over the trochanteric area. There is no of infection and her incision is well-healed. Specialty Comments:  No specialty comments available.  Imaging: No results found.   PMFS History: Patient Active Problem List   Diagnosis Date Noted  . Moderate episode of recurrent major depressive disorder ([place]) 07/26/2016  .  Trochanteric bursitis of left hip 02/04/2016  . Trochanteric bursitis of both hips 10/31/2015  . Chest pain at rest 06/17/2015  . Inferior myocardial infarction ([place]) 06/17/2015  . Sprain of interphalangeal joint of left little finger 02/05/2014  . Pain in joint, hand 02/05/2014  . Midsternal chest pain 06/04/2013  . CAD (coronary artery disease)   . Fibromyalgia   . GERD (gastroesophageal reflux disease)   . Migraine headache   . Acute non-STEMI < 8 weeks prior, subsequent admission after initial 01/16/2013  . Radial nerve laceration 03/27/2012  . Lack of coordination 03/27/2012  . Muscle weakness (generalized) 03/27/2012   Past Medical History:  Diagnosis Date  . Anemia    hx  . Anxiety   . CAD (coronary artery disease)    a. 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 05/2013 Lexi CL: EF 77%, old small inferolat scar, no ischemia.  . Depression   . Fibromyalgia   . GERD (gastroesophageal reflux disease)    hasn't started taking any meds  . History of  blood transfusion    19yrs ago  . Hx of colonic polyps   . IBS (irritable bowel syndrome)    constipation  . Joint pain   . Joint swelling   . Kidney stones 03/2010  . Migraine    "q other week" (02/04/2016)  . Myocardial infarction   . Numbness    right leg  . Polycythemia   . PONV (postoperative nausea and vomiting)   . Raynaud's disease   . Scoliosis   . Shortness of breath dyspnea    with exertion    Family History  Problem Relation Age of Onset  . Cancer Mother     Deceased with lung CA  . Heart failure Mother   . Heart disease Mother   . COPD Mother   . Varicose Veins Mother   . Cancer Father     Deceased with lung CA  . Heart disease Father   . Varicose Veins Father      Past Surgical History:  Procedure Laterality Date  . ABDOMINAL HYSTERECTOMY     partial  . APPENDECTOMY     with explor. lap.  Marland Kitchen CARDIAC CATHETERIZATION    . CHOLECYSTECTOMY  10/28/2002   lap.  . COLONOSCOPY    . DEBRIDEMENT TENNIS ELBOW    . DIAGNOSTIC LAPAROSCOPY    . DILATION AND CURETTAGE OF UTERUS    . ELBOW SURGERY     golfer's elbow-bilateral  . ESOPHAGOGASTRODUODENOSCOPY  09/10/2012   Procedure: ESOPHAGOGASTRODUODENOSCOPY (EGD);  Surgeon: Rogene Houston, MD;  [facility];  Service: Endoscopy;  Laterality: N/A;  730  . EXCISION/RELEASE BURSA HIP Left 02/04/2016   Procedure: LEFT HIP TROCHANTERIC BURSECTOMY;  Surgeon: Mcarthur Rossetti, MD;  Location: [place];  Service: Orthopedics;  Laterality: Left;  . I&D EXTREMITY  02/25/2012   Procedure: IRRIGATION AND DEBRIDEMENT EXTREMITY;  Surgeon: Roseanne Kaufman, MD;  Location: [place];  Service: Orthopedics;  Laterality: Right;  Irrigation and Debridement and Repair Right Thumb Nerve Laceration and Assoiated Structures   . KNEE DEBRIDEMENT     right  . LAPAROTOMY    . LEFT HEART CATHETERIZATION WITH CORONARY ANGIOGRAM N/A 01/16/2013   Procedure: LEFT HEART CATHETERIZATION WITH CORONARY ANGIOGRAM;  Surgeon: Jolaine Artist, MD;  Location: [facility] CATH LAB;  Service: Cardiovascular;  Laterality: N/A;  . STAPEDECTOMY     right  . TONSILLECTOMY AND ADENOIDECTOMY    . TRIGGER FINGER RELEASE  08/19/2011   Procedure: RELEASE TRIGGER FINGER/A-1 PULLEY;  Surgeon: Willa Frater III;  Location: [place];  Service: Orthopedics;  Laterality: Right;  right middle finger a-1 pulley release with tenosynovectomy  . TROCHANTERIC BURSA EXCISION Right 02/04/2016  . TUBAL LIGATION    . TYMPANOPLASTY     Social History   Occupational History  . Not on file.   Social History Main Topics  . Smoking status: Never Smoker  . Smokeless tobacco: Never Used  . Alcohol use 0.0 oz/week     Comment: rarely  . Drug use: No  .  Sexual activity: Yes    Birth control/ protection: Surgical

## 2016-08-03 DIAGNOSIS — M62838 Other muscle spasm: Secondary | ICD-10-CM | POA: Diagnosis not present

## 2016-08-03 DIAGNOSIS — S6722XD Crushing injury of left hand, subsequent encounter: Secondary | ICD-10-CM | POA: Diagnosis not present

## 2016-08-03 DIAGNOSIS — L905 Scar conditions and fibrosis of skin: Secondary | ICD-10-CM | POA: Diagnosis not present

## 2016-08-03 DIAGNOSIS — N3946 Mixed incontinence: Secondary | ICD-10-CM | POA: Diagnosis not present

## 2016-08-03 DIAGNOSIS — M25552 Pain in left hip: Secondary | ICD-10-CM | POA: Diagnosis not present

## 2016-08-03 DIAGNOSIS — R102 Pelvic and perineal pain: Secondary | ICD-10-CM | POA: Diagnosis not present

## 2016-08-03 DIAGNOSIS — M6281 Muscle weakness (generalized): Secondary | ICD-10-CM | POA: Diagnosis not present

## 2016-08-05 ENCOUNTER — Ambulatory Visit: Payer: 59 | Admitting: Cardiovascular Disease

## 2016-08-17 DIAGNOSIS — R278 Other lack of coordination: Secondary | ICD-10-CM | POA: Diagnosis not present

## 2016-08-17 DIAGNOSIS — R102 Pelvic and perineal pain: Secondary | ICD-10-CM | POA: Diagnosis not present

## 2016-08-17 DIAGNOSIS — N3946 Mixed incontinence: Secondary | ICD-10-CM | POA: Diagnosis not present

## 2016-08-17 DIAGNOSIS — M6281 Muscle weakness (generalized): Secondary | ICD-10-CM | POA: Diagnosis not present

## 2016-08-17 DIAGNOSIS — M62838 Other muscle spasm: Secondary | ICD-10-CM | POA: Diagnosis not present

## 2016-08-23 DIAGNOSIS — H524 Presbyopia: Secondary | ICD-10-CM | POA: Diagnosis not present

## 2016-08-23 DIAGNOSIS — H43811 Vitreous degeneration, right eye: Secondary | ICD-10-CM | POA: Diagnosis not present

## 2016-08-23 DIAGNOSIS — H52223 Regular astigmatism, bilateral: Secondary | ICD-10-CM | POA: Diagnosis not present

## 2016-08-23 DIAGNOSIS — H5213 Myopia, bilateral: Secondary | ICD-10-CM | POA: Diagnosis not present

## 2016-08-25 ENCOUNTER — Ambulatory Visit (INDEPENDENT_AMBULATORY_CARE_PROVIDER_SITE_OTHER): Payer: 59 | Admitting: Psychiatry

## 2016-08-25 ENCOUNTER — Encounter (HOSPITAL_COMMUNITY): Payer: Self-pay | Admitting: Psychiatry

## 2016-08-25 VITALS — BP 121/69 | HR 65 | Ht 68.0 in | Wt 143.2 lb

## 2016-08-25 DIAGNOSIS — Z7982 Long term (current) use of aspirin: Secondary | ICD-10-CM

## 2016-08-25 DIAGNOSIS — Z9889 Other specified postprocedural states: Secondary | ICD-10-CM

## 2016-08-25 DIAGNOSIS — Z801 Family history of malignant neoplasm of trachea, bronchus and lung: Secondary | ICD-10-CM | POA: Diagnosis not present

## 2016-08-25 DIAGNOSIS — Z79899 Other long term (current) drug therapy: Secondary | ICD-10-CM

## 2016-08-25 DIAGNOSIS — F331 Major depressive disorder, recurrent, moderate: Secondary | ICD-10-CM

## 2016-08-25 DIAGNOSIS — Z8249 Family history of ischemic heart disease and other diseases of the circulatory system: Secondary | ICD-10-CM

## 2016-08-25 DIAGNOSIS — Z888 Allergy status to other drugs, medicaments and biological substances status: Secondary | ICD-10-CM

## 2016-08-25 MED ORDER — DULOXETINE HCL 60 MG PO CPEP: 60.0000 mg | ORAL_CAPSULE | Freq: Every day | ORAL | 1 refills | Status: DC
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
## 2016-08-25 NOTE — Patient Instructions (Signed)
1. Continue duloxetine 60 mg daily 2. Discontinue Trazodone 3. Return to clinic in January

## 2016-08-26 DIAGNOSIS — M6281 Muscle weakness (generalized): Secondary | ICD-10-CM | POA: Diagnosis not present

## 2016-08-26 DIAGNOSIS — R102 Pelvic and perineal pain: Secondary | ICD-10-CM | POA: Diagnosis not present

## 2016-08-26 DIAGNOSIS — N3946 Mixed incontinence: Secondary | ICD-10-CM | POA: Diagnosis not present

## 2016-08-26 DIAGNOSIS — M62838 Other muscle spasm: Secondary | ICD-10-CM | POA: Diagnosis not present

## 2016-08-26 DIAGNOSIS — L905 Scar conditions and fibrosis of skin: Secondary | ICD-10-CM | POA: Diagnosis not present

## 2016-08-31 DIAGNOSIS — S6722XD Crushing injury of left hand, subsequent encounter: Secondary | ICD-10-CM | POA: Diagnosis not present

## 2016-09-05 ENCOUNTER — Other Ambulatory Visit: Payer: Self-pay | Admitting: Cardiovascular Disease

## 2016-09-05 ENCOUNTER — Encounter (HOSPITAL_COMMUNITY): Payer: Self-pay | Admitting: Psychiatry

## 2016-09-05 ENCOUNTER — Ambulatory Visit (INDEPENDENT_AMBULATORY_CARE_PROVIDER_SITE_OTHER): Payer: 59 | Admitting: Psychiatry

## 2016-09-05 DIAGNOSIS — F331 Major depressive disorder, recurrent, moderate: Secondary | ICD-10-CM

## 2016-09-05 MED FILL — NITROGLYCERIN 0.4 MG TAB SL: 0.4 | 90 days supply | Qty: 100 | Fill #0
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
## 2016-09-07 DIAGNOSIS — M6281 Muscle weakness (generalized): Secondary | ICD-10-CM | POA: Diagnosis not present

## 2016-09-07 DIAGNOSIS — L905 Scar conditions and fibrosis of skin: Secondary | ICD-10-CM | POA: Diagnosis not present

## 2016-09-07 DIAGNOSIS — M62838 Other muscle spasm: Secondary | ICD-10-CM | POA: Diagnosis not present

## 2016-09-07 DIAGNOSIS — N3946 Mixed incontinence: Secondary | ICD-10-CM | POA: Diagnosis not present

## 2016-09-07 DIAGNOSIS — M25552 Pain in left hip: Secondary | ICD-10-CM | POA: Diagnosis not present

## 2016-09-08 MED FILL — DULoxetine HCL 60 MG CPEP: 60 | 30 days supply | Qty: 30 | Fill #0

## 2016-09-20 ENCOUNTER — Ambulatory Visit (HOSPITAL_COMMUNITY): Payer: Self-pay | Admitting: Psychiatry

## 2016-09-22 DIAGNOSIS — M25552 Pain in left hip: Secondary | ICD-10-CM | POA: Diagnosis not present

## 2016-09-22 DIAGNOSIS — M62838 Other muscle spasm: Secondary | ICD-10-CM | POA: Diagnosis not present

## 2016-09-22 DIAGNOSIS — L905 Scar conditions and fibrosis of skin: Secondary | ICD-10-CM | POA: Diagnosis not present

## 2016-09-22 DIAGNOSIS — R159 Full incontinence of feces: Secondary | ICD-10-CM | POA: Diagnosis not present

## 2016-09-22 DIAGNOSIS — M6281 Muscle weakness (generalized): Secondary | ICD-10-CM | POA: Diagnosis not present

## 2016-09-27 ENCOUNTER — Encounter (HOSPITAL_COMMUNITY): Payer: Self-pay | Admitting: Psychiatry

## 2016-09-27 ENCOUNTER — Ambulatory Visit (INDEPENDENT_AMBULATORY_CARE_PROVIDER_SITE_OTHER): Payer: 59 | Admitting: Psychiatry

## 2016-09-27 DIAGNOSIS — F331 Major depressive disorder, recurrent, moderate: Secondary | ICD-10-CM
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
## 2016-10-04 ENCOUNTER — Encounter (HOSPITAL_COMMUNITY): Payer: Self-pay | Admitting: Psychiatry

## 2016-10-04 ENCOUNTER — Ambulatory Visit (INDEPENDENT_AMBULATORY_CARE_PROVIDER_SITE_OTHER): Payer: 59 | Admitting: Psychiatry

## 2016-10-04 VITALS — BP 92/66 | HR 68 | Ht 68.0 in | Wt 138.4 lb

## 2016-10-04 DIAGNOSIS — Z801 Family history of malignant neoplasm of trachea, bronchus and lung: Secondary | ICD-10-CM

## 2016-10-04 DIAGNOSIS — Z79899 Other long term (current) drug therapy: Secondary | ICD-10-CM

## 2016-10-04 DIAGNOSIS — Z9889 Other specified postprocedural states: Secondary | ICD-10-CM | POA: Diagnosis not present

## 2016-10-04 DIAGNOSIS — R45851 Suicidal ideations: Secondary | ICD-10-CM

## 2016-10-04 DIAGNOSIS — Z818 Family history of other mental and behavioral disorders: Secondary | ICD-10-CM

## 2016-10-04 DIAGNOSIS — Z8249 Family history of ischemic heart disease and other diseases of the circulatory system: Secondary | ICD-10-CM | POA: Diagnosis not present

## 2016-10-04 DIAGNOSIS — Z888 Allergy status to other drugs, medicaments and biological substances status: Secondary | ICD-10-CM

## 2016-10-04 DIAGNOSIS — Z7982 Long term (current) use of aspirin: Secondary | ICD-10-CM

## 2016-10-04 DIAGNOSIS — F331 Major depressive disorder, recurrent, moderate: Secondary | ICD-10-CM | POA: Diagnosis not present

## 2016-10-04 MED ORDER — DULOXETINE HCL 60 MG PO CPEP: ORAL_CAPSULE | ORAL | 1 refills | Status: DC

## 2016-10-04 MED ORDER — DULOXETINE HCL 30 MG PO CPEP: ORAL_CAPSULE | ORAL | 1 refills | Status: DC

## 2016-10-04 MED ORDER — TRAZODONE HCL 50 MG PO TABS: ORAL_TABLET | ORAL | 1 refills | Status: DC

## 2016-10-04 MED FILL — traZODone HCL 50 MG TABS: 50 | 30 days supply | Qty: 30 | Fill #0

## 2016-10-04 MED FILL — DULoxetine HCL 60 MG CPEP: 60 | 30 days supply | Qty: 30 | Fill #0

## 2016-10-04 MED FILL — DULoxetine HCL 30 MG CPEP: 30 | 30 days supply | Qty: 30 | Fill #0

## 2016-10-04 NOTE — Patient Instructions (Signed)
1. Increase duloxetine 90 mg (60 mg + 30 mg) 2. Start Trazodone 25-50 mg at night as needed for sleep 3. Return to clinic in one month

## 2016-10-11 ENCOUNTER — Encounter (HOSPITAL_COMMUNITY): Payer: Self-pay | Admitting: Psychiatry

## 2016-10-11 ENCOUNTER — Ambulatory Visit (INDEPENDENT_AMBULATORY_CARE_PROVIDER_SITE_OTHER): Payer: 59 | Admitting: Psychiatry

## 2016-10-11 DIAGNOSIS — F331 Major depressive disorder, recurrent, moderate: Secondary | ICD-10-CM | POA: Diagnosis not present
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
## 2016-10-20 DIAGNOSIS — Z820 Family history of epilepsy and other diseases of the nervous system: Secondary | ICD-10-CM | POA: Diagnosis not present

## 2016-10-20 DIAGNOSIS — R251 Tremor, unspecified: Secondary | ICD-10-CM | POA: Diagnosis not present

## 2016-10-21 DIAGNOSIS — R102 Pelvic and perineal pain: Secondary | ICD-10-CM | POA: Diagnosis not present

## 2016-10-21 DIAGNOSIS — M6281 Muscle weakness (generalized): Secondary | ICD-10-CM | POA: Diagnosis not present

## 2016-10-21 DIAGNOSIS — M62838 Other muscle spasm: Secondary | ICD-10-CM | POA: Diagnosis not present

## 2016-10-21 DIAGNOSIS — M25552 Pain in left hip: Secondary | ICD-10-CM | POA: Diagnosis not present

## 2016-10-25 ENCOUNTER — Ambulatory Visit (INDEPENDENT_AMBULATORY_CARE_PROVIDER_SITE_OTHER): Payer: 59 | Admitting: Psychiatry

## 2016-10-25 ENCOUNTER — Encounter (HOSPITAL_COMMUNITY): Payer: Self-pay | Admitting: Psychiatry

## 2016-10-25 DIAGNOSIS — F331 Major depressive disorder, recurrent, moderate: Secondary | ICD-10-CM
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
## 2016-11-01 ENCOUNTER — Encounter (HOSPITAL_COMMUNITY): Payer: Self-pay | Admitting: Psychiatry

## 2016-11-01 ENCOUNTER — Ambulatory Visit (INDEPENDENT_AMBULATORY_CARE_PROVIDER_SITE_OTHER): Payer: 59 | Admitting: Psychiatry

## 2016-11-01 VITALS — BP 115/63 | HR 66 | Ht 68.0 in | Wt 138.6 lb

## 2016-11-01 DIAGNOSIS — Z818 Family history of other mental and behavioral disorders: Secondary | ICD-10-CM

## 2016-11-01 DIAGNOSIS — F331 Major depressive disorder, recurrent, moderate: Secondary | ICD-10-CM

## 2016-11-01 DIAGNOSIS — Z7982 Long term (current) use of aspirin: Secondary | ICD-10-CM

## 2016-11-01 DIAGNOSIS — R45851 Suicidal ideations: Secondary | ICD-10-CM

## 2016-11-01 DIAGNOSIS — Z9049 Acquired absence of other specified parts of digestive tract: Secondary | ICD-10-CM | POA: Diagnosis not present

## 2016-11-01 DIAGNOSIS — Z801 Family history of malignant neoplasm of trachea, bronchus and lung: Secondary | ICD-10-CM

## 2016-11-01 DIAGNOSIS — Z90710 Acquired absence of both cervix and uterus: Secondary | ICD-10-CM

## 2016-11-01 DIAGNOSIS — Z9889 Other specified postprocedural states: Secondary | ICD-10-CM | POA: Diagnosis not present

## 2016-11-01 DIAGNOSIS — Z8249 Family history of ischemic heart disease and other diseases of the circulatory system: Secondary | ICD-10-CM

## 2016-11-01 DIAGNOSIS — Z8489 Family history of other specified conditions: Secondary | ICD-10-CM

## 2016-11-01 DIAGNOSIS — Z9851 Tubal ligation status: Secondary | ICD-10-CM

## 2016-11-01 DIAGNOSIS — Z888 Allergy status to other drugs, medicaments and biological substances status: Secondary | ICD-10-CM

## 2016-11-01 DIAGNOSIS — Z79899 Other long term (current) drug therapy: Secondary | ICD-10-CM

## 2016-11-01 MED ORDER — MIRTAZAPINE 7.5 MG PO TABS: 7.5000 mg | ORAL_TABLET | Freq: Every day | ORAL | 1 refills | Status: DC

## 2016-11-01 MED ORDER — DULOXETINE HCL 60 MG PO CPEP: 60.0000 mg | ORAL_CAPSULE | Freq: Every day | ORAL | 1 refills | Status: DC

## 2016-11-01 MED ORDER — TRAZODONE HCL 50 MG PO TABS: 50.0000 mg | ORAL_TABLET | Freq: Every evening | ORAL | 1 refills | Status: DC | PRN

## 2016-11-01 MED FILL — MIRTAZAPINE 7.5 MG TABLET: 7.5 | 30 days supply | Qty: 30 | Fill #0

## 2016-11-01 MED FILL — traZODone HCL 50 MG TABS: 50 | 30 days supply | Qty: 30 | Fill #0

## 2016-11-01 MED FILL — DULoxetine HCL 60 MG CPEP: 60 | 30 days supply | Qty: 30 | Fill #0

## 2016-11-01 NOTE — Patient Instructions (Signed)
1. Decrease duloxetine 60 mg daily 2. Continue Trazodone 50 mg at night as needed for sleep 3. Start mirtazapine 7.5 mg at night  4. Return to clinic in one month

## 2016-11-03 ENCOUNTER — Telehealth (HOSPITAL_COMMUNITY): Payer: Self-pay | Admitting: *Deleted

## 2016-11-03 NOTE — Telephone Encounter (Signed)
left voice message, provider out of office 11/08/16.  two follow up appointments scheduled.

## 2016-11-04 DIAGNOSIS — R300 Dysuria: Secondary | ICD-10-CM | POA: Diagnosis not present

## 2016-11-04 DIAGNOSIS — M6281 Muscle weakness (generalized): Secondary | ICD-10-CM | POA: Diagnosis not present

## 2016-11-04 DIAGNOSIS — M25552 Pain in left hip: Secondary | ICD-10-CM | POA: Diagnosis not present

## 2016-11-04 DIAGNOSIS — L905 Scar conditions and fibrosis of skin: Secondary | ICD-10-CM | POA: Diagnosis not present

## 2016-11-08 ENCOUNTER — Ambulatory Visit (HOSPITAL_COMMUNITY): Payer: Self-pay | Admitting: Psychiatry

## 2016-11-16 ENCOUNTER — Ambulatory Visit (INDEPENDENT_AMBULATORY_CARE_PROVIDER_SITE_OTHER): Payer: 59 | Admitting: Neurology

## 2016-11-16 ENCOUNTER — Encounter: Payer: Self-pay | Admitting: Neurology

## 2016-11-16 VITALS — BP 145/79 | HR 68 | Ht 67.5 in | Wt 142.5 lb

## 2016-11-16 DIAGNOSIS — R251 Tremor, unspecified: Secondary | ICD-10-CM

## 2016-11-16 NOTE — Progress Notes (Signed)
Reason for visit: Tremor  Referring physician: Dr. Landis Martins is a 66 y.o. female  History of present illness:  [name] is a 66 year old right-handed white female with a history of tremor that has been present for least 3 or 4 years. The patient indicates that the tremor may be present with activity or with rest of the arms. The tremors are on both sides, and it is relatively symmetric. She indicates that she likes to paint, but any activity requiring fine motor control is more difficult to perform. She does note a tremor with feeding herself, she denies any sleeping issues with handwriting. She is still working, she finds that the tremor does affect some of the things that she does at work. The patient has a strong family history of Parkinson's disease in a paternal grandfather, maternal grandmother, and a sister. The sister just recently died at age 34 with sudden death. The patient may occasionally note some tremor with the left leg. She denies any head or neck tremor, she does note some difficulty with swallowing liquids at times. She has not had any alteration in speech. The patient takes Phenergan on occasion for migraine headaches, but she does not use Phenergan on a daily basis. She has fibromyalgia and chronic low back pain. She will have good days and bad days with the tremor, if she is upset or nervous about something the tremor will be much worse. She is sent to this office for further evaluation.  Past Medical History:  Diagnosis Date  . Anemia    hx  . Anxiety   . CAD (coronary artery disease)    a. 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 05/2013 Lexi CL: EF 77%, old small inferolat scar, no ischemia.  . Depression   .  Fibromyalgia   . GERD (gastroesophageal reflux disease)    hasn't started taking any meds  . History of blood transfusion    27yrs ago  . Hx of colonic polyps   . IBS (irritable bowel syndrome)    constipation  . Joint pain   . Joint swelling   . Kidney stones 03/2010  . Migraine    "q other week" (02/04/2016)  . Myocardial infarction   . Numbness    right leg  . Polycythemia   . PONV (postoperative nausea and vomiting)   . Raynaud's disease   . Scoliosis   . Shortness of breath dyspnea    with exertion    Past Surgical History:  Procedure Laterality Date  . ABDOMINAL HYSTERECTOMY     partial  . APPENDECTOMY     with explor. lap.  Marland Kitchen CARDIAC CATHETERIZATION    . CHOLECYSTECTOMY  10/28/2002   lap.  . COLONOSCOPY    . DEBRIDEMENT TENNIS ELBOW    . DIAGNOSTIC LAPAROSCOPY    . DILATION AND CURETTAGE OF UTERUS    . ELBOW SURGERY     golfer's elbow-bilateral  . ESOPHAGOGASTRODUODENOSCOPY  09/10/2012   Procedure: ESOPHAGOGASTRODUODENOSCOPY (EGD);  Surgeon: Rogene Houston, MD;  [facility];  Service: Endoscopy;  Laterality: N/A;  730  . EXCISION/RELEASE BURSA HIP Left 02/04/2016   Procedure: LEFT HIP TROCHANTERIC BURSECTOMY;  Surgeon: Harrell Gave  Kerry Fort, MD;  Location: [place];  Service: Orthopedics;  Laterality: Left;  . I&D EXTREMITY  02/25/2012   Procedure: IRRIGATION AND DEBRIDEMENT EXTREMITY;  Surgeon: Roseanne Kaufman, MD;  Location: [place];  Service: Orthopedics;  Laterality: Right;  Irrigation and Debridement and Repair Right Thumb Nerve Laceration and Assoiated Structures   . KNEE DEBRIDEMENT     right  . LAPAROTOMY    . LEFT HEART CATHETERIZATION WITH CORONARY ANGIOGRAM N/A 01/16/2013   Procedure: LEFT HEART CATHETERIZATION WITH CORONARY ANGIOGRAM;  Surgeon: Jolaine Artist, MD;  Location: [facility] CATH LAB;  Service: Cardiovascular;  Laterality: N/A;  . STAPEDECTOMY     right  . TONSILLECTOMY AND ADENOIDECTOMY    . TRIGGER FINGER RELEASE  08/19/2011    Procedure: RELEASE TRIGGER FINGER/A-1 PULLEY;  Surgeon: Willa Frater III;  Location: [place];  Service: Orthopedics;  Laterality: Right;  right middle finger a-1 pulley release with tenosynovectomy  . TROCHANTERIC BURSA EXCISION Right 02/04/2016  . TUBAL LIGATION    . TYMPANOPLASTY      Family History  Problem Relation Age of Onset  . Cancer Mother     Deceased with lung CA  . Heart failure Mother   . Heart disease Mother   . COPD Mother   . Varicose Veins Mother   . Depression Mother   . Cancer Father     Deceased with lung CA  . Heart disease Father   . Varicose Veins Father   . Depression Father   . Depression Brother   . ADD / ADHD Son     Social history:  reports that she has never smoked. She has never used smokeless tobacco. She reports that she does not drink alcohol or use drugs.  Medications:  Prior to Admission medications   Medication Sig Start Date End Date Taking? Authorizing Provider  acetaminophen (TYLENOL) 325 MG tablet Take 650 mg by mouth every 6 (six) hours as needed for mild pain.   Yes Historical Provider, MD  Ascorbic Acid (VITAMIN C PO) Take 2 tablets by mouth daily.   Yes Historical Provider, MD  aspirin EC 81 MG EC tablet Take 1 tablet (81 mg total) by mouth daily. 01/18/13  Yes Rogelia Mire, NP  atorvastatin (LIPITOR) 40 MG tablet Take 1 tablet (40 mg total) by mouth daily. 03/03/16  Yes Herminio Commons, MD  Biotin (BIOTIN 5000) 5 MG CAPS Take 1 capsule by mouth daily.   Yes Historical Provider, MD  Calcium Citrate-Vitamin D (CALCITRATE/VITAMIN D PO) Take 1 tablet by mouth daily.   Yes Historical Provider, MD  carisoprodol (SOMA) 350 MG tablet Take 350 mg by mouth as needed.    Yes Historical Provider, MD  celecoxib (CELEBREX) 200 MG capsule Take 200 mg by mouth as needed.    Yes Historical Provider, MD  docusate sodium (COLACE) 100 MG capsule Take 100 mg by mouth daily as needed for mild constipation.   Yes Historical  Provider, MD  DULoxetine (CYMBALTA) 60 MG capsule Take 1 capsule (60 mg total) by mouth daily. 11/01/16  Yes Norman Clay, MD  gabapentin (NEURONTIN) 300 MG capsule Take 300 mg by mouth as needed.    Yes Historical Provider, MD  isosorbide dinitrate (ISORDIL) 10 MG tablet Take 10 mg at night,may take additional 10 mg at 8 am 06/17/16  Yes Herminio Commons, MD  mirtazapine (REMERON) 7.5 MG tablet Take 1 tablet (7.5 mg total) by mouth at bedtime. 11/01/16  Yes Norman Clay, MD  nitroGLYCERIN (NITROSTAT) 0.4 MG SL tablet PLACE 1 TABLET UNDER THE TONGUE EVERY 5 MINUTES AS NEEDED FOR CHEST PAIN 09/05/16  Yes Herminio Commons, MD  pantoprazole (PROTONIX) 40 MG tablet TAKE 1 TABLET BY MOUTH ONCE DAILY 06/06/16  Yes Rogene Houston, MD  promethazine (PHENERGAN) 25 MG tablet Take 25 mg by mouth every 6 (six) hours as needed for nausea or vomiting.   Yes Historical Provider, MD  ranolazine (RANEXA) 1000 MG SR tablet Take 1 tablet (1,000 mg total) by mouth 2 (two) times daily. 01/08/16  Yes Herminio Commons, MD  traZODone (DESYREL) 50 MG tablet Take 1 tablet (50 mg total) by mouth at bedtime as needed for sleep. 11/01/16  Yes Norman Clay, MD  Vitamin D, Ergocalciferol, (DRISDOL) 50000 units CAPS capsule Take 50,000 Units by mouth every 7 (seven) days.    Yes Historical Provider, MD      Allergies  Allergen Reactions  . Dilaudid [Hydromorphone Hcl] Other (See Comments)    Resp. arrest  . Codeine Nausea And Vomiting and Rash  . Morphine And Related Nausea And Vomiting    ROS:  Out of a complete 14 system review of symptoms, the patient complains only of the following symptoms, and all other reviewed systems are negative.  Weight loss, fatigue Chest pain Hearing loss, ringing in the ears, difficulty swallowing Blurred vision Shortness of breath Constipation Urination problems Easy bruising, easy bleeding Feeling hot, cold Joint pain, joint swelling, muscle cramps, aching  muscles Allergies Memory loss, confusion, headache, numbness, weakness, slurred speech, difficulty swallowing, dizziness, tremor Depression, anxiety, not enough sleep, decreased energy, change in appetite, disinterest in activities, suicidal falls, racing thoughts Insomnia, sleepiness, restless legs  Blood pressure (!) 145/79, pulse 68, height 5' 7.5" (1.715 m), weight 142 lb 8 oz (64.6 kg).  Physical Exam  General: The patient is alert and cooperative at the time of the examination.  Eyes: Pupils are equal, round, and reactive to light. Discs are flat bilaterally.  Neck: The neck is supple, no carotid bruits are noted.  Respiratory: The respiratory examination is clear.  Cardiovascular: The cardiovascular examination reveals a regular rate and rhythm, no obvious murmurs or rubs are noted.  Skin: Extremities are without significant edema.  Neurologic Exam  Mental status: The patient is alert and oriented x 3 at the time of the examination. The patient has apparent normal recent and remote memory, with an apparently normal attention span and concentration ability.  Cranial nerves: Facial symmetry is present. There is good sensation of the face to pinprick and soft touch bilaterally. The strength of the facial muscles and the muscles to head turning and shoulder shrug are normal bilaterally. Speech is well enunciated, no aphasia or dysarthria is noted. Extraocular movements are full. Visual fields are full. The tongue is midline, and the patient has symmetric elevation of the soft palate. No obvious hearing deficits are noted.  Motor: The motor testing reveals 5 over 5 strength of all 4 extremities. Good symmetric motor tone is noted throughout.  Sensory: Sensory testing is intact to pinprick, soft touch, vibration sensation, and position sense on all 4 extremities. No evidence of extinction is noted.  Coordination: Cerebellar testing reveals good finger-nose-finger and heel-to-shin  bilaterally. An intention tremor seen with finger-nose-finger with both hands in a symmetric fashion, and with arms outstretched.  Gait and station: Gait is normal. With walking, there is slight decrease in arm swing on the left as compared to the right. Tandem gait is normal. Romberg  is negative. No drift is seen.  Reflexes: Deep tendon reflexes are symmetric and normal bilaterally. Toes are downgoing bilaterally.   Assessment/Plan:  1. Tremor, probable essential tremor  The patient appears to have an action type tremor that is symmetric, not consistent with a parkinsonian tremor. The patient does have some decreased arm swing on the left with walking, however. The patient will be followed for any developing signs of parkinsonism. She will follow-up in about 7 months for an evaluation.  Jill Alexanders MD 11/16/2016 9:54 AM  [facility] [address] [facility] [place], [place] [address]  Phone [phone] Fax [phone]

## 2016-11-22 ENCOUNTER — Encounter (HOSPITAL_COMMUNITY): Payer: Self-pay | Admitting: Psychiatry

## 2016-11-22 ENCOUNTER — Ambulatory Visit (INDEPENDENT_AMBULATORY_CARE_PROVIDER_SITE_OTHER): Payer: 59 | Admitting: Psychiatry

## 2016-11-22 DIAGNOSIS — F331 Major depressive disorder, recurrent, moderate: Secondary | ICD-10-CM | POA: Diagnosis not present
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
## 2016-11-24 DIAGNOSIS — N3946 Mixed incontinence: Secondary | ICD-10-CM | POA: Diagnosis not present

## 2016-11-24 DIAGNOSIS — M25552 Pain in left hip: Secondary | ICD-10-CM | POA: Diagnosis not present

## 2016-11-24 DIAGNOSIS — M6281 Muscle weakness (generalized): Secondary | ICD-10-CM | POA: Diagnosis not present

## 2016-11-24 DIAGNOSIS — M62838 Other muscle spasm: Secondary | ICD-10-CM | POA: Diagnosis not present

## 2016-11-24 DIAGNOSIS — R102 Pelvic and perineal pain: Secondary | ICD-10-CM | POA: Diagnosis not present
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
## 2016-11-29 ENCOUNTER — Ambulatory Visit (HOSPITAL_COMMUNITY): Payer: Self-pay | Admitting: Psychiatry

## 2016-11-29 ENCOUNTER — Ambulatory Visit (INDEPENDENT_AMBULATORY_CARE_PROVIDER_SITE_OTHER): Payer: 59 | Admitting: Psychiatry

## 2016-11-29 ENCOUNTER — Encounter (HOSPITAL_COMMUNITY): Payer: Self-pay | Admitting: Psychiatry

## 2016-11-29 VITALS — BP 102/69 | HR 77 | Ht 67.5 in | Wt 139.0 lb

## 2016-11-29 DIAGNOSIS — F331 Major depressive disorder, recurrent, moderate: Secondary | ICD-10-CM

## 2016-11-29 DIAGNOSIS — Z818 Family history of other mental and behavioral disorders: Secondary | ICD-10-CM

## 2016-11-29 MED ORDER — TRAZODONE HCL 50 MG PO TABS: 50.0000 mg | ORAL_TABLET | Freq: Every evening | ORAL | 1 refills | Status: DC | PRN

## 2016-11-29 MED ORDER — DULOXETINE HCL 60 MG PO CPEP: 60.0000 mg | ORAL_CAPSULE | Freq: Every day | ORAL | 1 refills | Status: DC

## 2016-11-29 MED ORDER — MIRTAZAPINE 15 MG PO TABS
15.0000 mg | ORAL_TABLET | Freq: Every day | ORAL | 1 refills | Status: DC
Start: 2016-11-29 — End: 2016-12-27

## 2016-11-29 NOTE — Patient Instructions (Signed)
1. Continue duloxetine 60 mg daily 2. Continue Trazodone 50 mg at night as needed for sleep 3. Increase mirtazapine 15 mg at night  4. Return to clinic in one month
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
## 2016-12-01 ENCOUNTER — Telehealth (INDEPENDENT_AMBULATORY_CARE_PROVIDER_SITE_OTHER): Payer: Self-pay | Admitting: *Deleted
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
## 2016-12-06 ENCOUNTER — Ambulatory Visit (INDEPENDENT_AMBULATORY_CARE_PROVIDER_SITE_OTHER): Payer: 59 | Admitting: Psychiatry

## 2016-12-06 DIAGNOSIS — F331 Major depressive disorder, recurrent, moderate: Secondary | ICD-10-CM
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
## 2016-12-08 ENCOUNTER — Other Ambulatory Visit: Payer: Self-pay | Admitting: Cardiovascular Disease

## 2016-12-08 MED FILL — ISOSORBIDE DN 10 MG TABLET: 10 | 45 days supply | Qty: 90 | Fill #1

## 2016-12-08 MED FILL — ATORVASTATIN 40 MG TABLET: 40 | 90 days supply | Qty: 90 | Fill #0

## 2016-12-08 MED FILL — traZODone HCL 50 MG TABS: 50 | 30 days supply | Qty: 30 | Fill #0

## 2016-12-08 MED FILL — RANEXA ER 1,000 MG TABLET: 1000 | 90 days supply | Qty: 180 | Fill #2

## 2016-12-09 ENCOUNTER — Other Ambulatory Visit (HOSPITAL_COMMUNITY): Payer: Self-pay | Admitting: Internal Medicine

## 2016-12-09 ENCOUNTER — Ambulatory Visit (HOSPITAL_COMMUNITY)
Admission: RE | Admit: 2016-12-09 | Discharge: 2016-12-09 | Disposition: A | Discharge status: Home / Self Care | Payer: 59 | Source: Ambulatory Visit | Attending: Internal Medicine | Admitting: Internal Medicine

## 2016-12-09 DIAGNOSIS — Z802 Family history of malignant neoplasm of other respiratory and intrathoracic organs: Secondary | ICD-10-CM | POA: Diagnosis not present

## 2016-12-09 DIAGNOSIS — Z801 Family history of malignant neoplasm of trachea, bronchus and lung: Secondary | ICD-10-CM

## 2016-12-09 DIAGNOSIS — R05 Cough: Secondary | ICD-10-CM | POA: Diagnosis not present

## 2016-12-14 DIAGNOSIS — L905 Scar conditions and fibrosis of skin: Secondary | ICD-10-CM | POA: Diagnosis not present

## 2016-12-14 DIAGNOSIS — M62838 Other muscle spasm: Secondary | ICD-10-CM | POA: Diagnosis not present

## 2016-12-14 DIAGNOSIS — M6281 Muscle weakness (generalized): Secondary | ICD-10-CM | POA: Diagnosis not present

## 2016-12-14 DIAGNOSIS — M25552 Pain in left hip: Secondary | ICD-10-CM | POA: Diagnosis not present

## 2016-12-14 DIAGNOSIS — N3946 Mixed incontinence: Secondary | ICD-10-CM | POA: Diagnosis not present

## 2016-12-20 ENCOUNTER — Ambulatory Visit (INDEPENDENT_AMBULATORY_CARE_PROVIDER_SITE_OTHER): Payer: BLUE CROSS/BLUE SHIELD | Admitting: Psychiatry

## 2016-12-20 ENCOUNTER — Encounter (HOSPITAL_COMMUNITY): Payer: Self-pay | Admitting: Psychiatry

## 2016-12-20 DIAGNOSIS — F331 Major depressive disorder, recurrent, moderate: Secondary | ICD-10-CM | POA: Diagnosis not present
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
## 2016-12-22 NOTE — Telephone Encounter (Signed)
Unable to reach patient despite 3 attempts. I have left message on her answering service to call.
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
## 2016-12-27 ENCOUNTER — Encounter (HOSPITAL_COMMUNITY): Payer: Self-pay | Admitting: Psychiatry

## 2016-12-27 ENCOUNTER — Ambulatory Visit (INDEPENDENT_AMBULATORY_CARE_PROVIDER_SITE_OTHER): Payer: BLUE CROSS/BLUE SHIELD | Admitting: Psychiatry

## 2016-12-27 VITALS — BP 124/73 | HR 68 | Ht 67.5 in | Wt 144.8 lb

## 2016-12-27 DIAGNOSIS — Z818 Family history of other mental and behavioral disorders: Secondary | ICD-10-CM

## 2016-12-27 DIAGNOSIS — F331 Major depressive disorder, recurrent, moderate: Secondary | ICD-10-CM

## 2016-12-27 DIAGNOSIS — F419 Anxiety disorder, unspecified: Secondary | ICD-10-CM

## 2016-12-27 DIAGNOSIS — I2510 Atherosclerotic heart disease of native coronary artery without angina pectoris: Secondary | ICD-10-CM

## 2016-12-27 DIAGNOSIS — M7060 Trochanteric bursitis, unspecified hip: Secondary | ICD-10-CM

## 2016-12-27 DIAGNOSIS — Z7982 Long term (current) use of aspirin: Secondary | ICD-10-CM

## 2016-12-27 DIAGNOSIS — Z79899 Other long term (current) drug therapy: Secondary | ICD-10-CM

## 2016-12-27 DIAGNOSIS — K589 Irritable bowel syndrome without diarrhea: Secondary | ICD-10-CM

## 2016-12-27 DIAGNOSIS — M797 Fibromyalgia: Secondary | ICD-10-CM | POA: Diagnosis not present

## 2016-12-27 MED ORDER — DULOXETINE HCL 60 MG PO CPEP: 60.0000 mg | ORAL_CAPSULE | Freq: Every day | ORAL | 1 refills | Status: DC

## 2016-12-27 MED ORDER — MIRTAZAPINE 15 MG PO TABS: 15.0000 mg | ORAL_TABLET | Freq: Every day | ORAL | 1 refills | Status: DC

## 2016-12-27 MED ORDER — TRAZODONE HCL 50 MG PO TABS: 50.0000 mg | ORAL_TABLET | Freq: Every evening | ORAL | 1 refills | Status: DC | PRN

## 2016-12-27 NOTE — Patient Instructions (Signed)
1. Continue duloxetine 60 mg daily  2. Start 3irtazapine 15 mg at night  3. Continue Trazodone 50 mg at night as needed for sleep 4. Return to clinic in one month

## 2017-01-02 ENCOUNTER — Telehealth (INDEPENDENT_AMBULATORY_CARE_PROVIDER_SITE_OTHER): Payer: Self-pay | Admitting: Internal Medicine

## 2017-01-02 NOTE — Telephone Encounter (Signed)
Patient called, stated she would like for Terri to cal her so she can give you her symptoms.  She's hoarse, stomach problems and reflux so bad she can't lay down to sleep, several things going on.  She would like to see if there's something she can get, maybe Dexilant.  Her PPI is not working.  [phone]

## 2017-01-02 NOTE — Telephone Encounter (Signed)
Samples of Dexilant to front.

## 2017-01-03 ENCOUNTER — Ambulatory Visit (INDEPENDENT_AMBULATORY_CARE_PROVIDER_SITE_OTHER): Payer: BLUE CROSS/BLUE SHIELD | Admitting: Psychiatry

## 2017-01-03 ENCOUNTER — Encounter (HOSPITAL_COMMUNITY): Payer: Self-pay | Admitting: Psychiatry

## 2017-01-03 DIAGNOSIS — F331 Major depressive disorder, recurrent, moderate: Secondary | ICD-10-CM
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
## 2017-01-09 MED FILL — traZODone HCL 50 MG TABS: 50 | 30 days supply | Qty: 30 | Fill #0

## 2017-01-09 MED FILL — DULoxetine HCL 60 MG CPEP: 60 | 30 days supply | Qty: 30 | Fill #0

## 2017-01-09 MED FILL — MIRTAZAPINE 15 MG TABLET: 15 | 30 days supply | Qty: 30 | Fill #0

## 2017-01-13 ENCOUNTER — Ambulatory Visit (INDEPENDENT_AMBULATORY_CARE_PROVIDER_SITE_OTHER): Payer: BLUE CROSS/BLUE SHIELD | Admitting: Cardiovascular Disease

## 2017-01-13 ENCOUNTER — Encounter: Payer: Self-pay | Admitting: Cardiovascular Disease

## 2017-01-13 VITALS — BP 122/70 | HR 70 | Ht 67.5 in | Wt 144.4 lb

## 2017-01-13 DIAGNOSIS — I25118 Atherosclerotic heart disease of native coronary artery with other forms of angina pectoris: Secondary | ICD-10-CM

## 2017-01-13 DIAGNOSIS — E7800 Pure hypercholesterolemia, unspecified: Secondary | ICD-10-CM | POA: Diagnosis not present

## 2017-01-13 DIAGNOSIS — I83893 Varicose veins of bilateral lower extremities with other complications: Secondary | ICD-10-CM

## 2017-01-13 DIAGNOSIS — I2510 Atherosclerotic heart disease of native coronary artery without angina pectoris: Secondary | ICD-10-CM | POA: Diagnosis not present

## 2017-01-13 NOTE — Patient Instructions (Signed)
Your physician wants you to follow-up in: 1 year April 2018) with Dr. Virgina Jock will receive a reminder letter in the mail two months in advance. If you don't receive a letter, please call our office to schedule the follow-up appointment.   Your physician recommends that you continue on your current medications as directed. Please refer to the Current Medication list given to you today.   If you need a refill on your cardiac medications before your next appointment, please call your pharmacy.    Thank you for choosing [facility] !

## 2017-01-13 NOTE — Progress Notes (Signed)
SUBJECTIVE: The patient presents for routine follow-up. She is a 60 old woman with a history of coronary artery disease and varicose veins, as well as depression, anxiety, fibromyalgia, and irritable bowel syndrome. She follows with psychiatry.  She has occasional chest pains but she has been under a lot of stress. She retired 18 months ago and finds it difficult staying at home. She recently fractured her left hand and her coccyx. She underwent left hip surgery and said it did not work.  ECG performed in the office today which I ordered and personally interpreted demonstrates normal sinus rhythm with no ischemic ST segment or T-wave abnormalities, nor any arrhythmias.    Review of Systems: As per "subjective", otherwise negative.  Allergies  Allergen Reactions  . Dilaudid [Hydromorphone Hcl] Other (See Comments)    Resp. arrest  . Codeine Nausea And Vomiting and Rash  . Morphine And Related Nausea And Vomiting    Current Outpatient Prescriptions  Medication Sig Dispense Refill  . acetaminophen (TYLENOL) 325 MG tablet Take 650 mg by mouth every 6 (six) hours as needed for mild pain.    . Ascorbic Acid (VITAMIN C PO) Take 2 tablets by mouth daily.    Marland Kitchen aspirin EC 81 MG EC tablet Take 1 tablet (81 mg total) by mouth daily.    Marland Kitchen atorvastatin (LIPITOR) 40 MG tablet TAKE 1 TABLET BY MOUTH DAILY. 90 tablet 1  . Biotin (BIOTIN 5000) 5 MG CAPS Take 1 capsule by mouth daily.    . Calcium Citrate-Vitamin D (CALCITRATE/VITAMIN D PO) Take 1 tablet by mouth daily.    . carisoprodol (SOMA) 350 MG tablet Take 350 mg by mouth as needed.     . celecoxib (CELEBREX) 200 MG capsule Take 200 mg by mouth as needed.     Marland Kitchen dexlansoprazole (DEXILANT) 60 MG capsule Take 60 mg by mouth daily.    Marland Kitchen docusate sodium (COLACE) 100 MG capsule Take 100 mg by mouth daily as needed for mild constipation.    . DULoxetine (CYMBALTA) 60 MG capsule Take 1 capsule (60 mg total) by mouth daily. 30 capsule 1  .  gabapentin (NEURONTIN) 300 MG capsule Take 300 mg by mouth as needed.     . isosorbide dinitrate (ISORDIL) 10 MG tablet Take 10 mg at night,may take additional 10 mg at 8 am 90 tablet 2  . mirtazapine (REMERON) 15 MG tablet Take 1 tablet (15 mg total) by mouth at bedtime. 30 tablet 1  . nitroGLYCERIN (NITROSTAT) 0.4 MG SL tablet PLACE 1 TABLET UNDER THE TONGUE EVERY 5 MINUTES AS NEEDED FOR CHEST PAIN 100 tablet 1  . pantoprazole (PROTONIX) 40 MG tablet TAKE 1 TABLET BY MOUTH ONCE DAILY 30 tablet 5  . promethazine (PHENERGAN) 25 MG tablet Take 25 mg by mouth every 6 (six) hours as needed for nausea or vomiting.    . ranolazine (RANEXA) 1000 MG SR tablet Take 1 tablet (1,000 mg total) by mouth 2 (two) times daily. 180 tablet 3  . traZODone (DESYREL) 50 MG tablet Take 1 tablet (50 mg total) by mouth at bedtime as needed for sleep. 30 tablet 1  . Vitamin D, Ergocalciferol, (DRISDOL) 50000 units CAPS capsule Take 50,000 Units by mouth every 7 (seven) days.      No current facility-administered medications for this visit.     Past Medical History:  Diagnosis Date  . Anemia    hx  . Anxiety   . CAD (coronary artery disease)  a. 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 05/2013 Lexi CL: EF 77%, old small inferolat scar, no ischemia.  . Depression   . Fibromyalgia   . GERD (gastroesophageal reflux disease)    hasn't started taking any meds  . History of blood transfusion    23yrs ago  . Hx of colonic polyps   . IBS (irritable bowel syndrome)    constipation  . Joint pain   . Joint swelling   . Kidney stones 03/2010  . Migraine    "q other week" (02/04/2016)  . Myocardial infarction ([place])   . Numbness    right leg  . Polycythemia   . PONV  (postoperative nausea and vomiting)   . Raynaud's disease   . Scoliosis   . Shortness of breath dyspnea    with exertion    Past Surgical History:  Procedure Laterality Date  . ABDOMINAL HYSTERECTOMY     partial  . APPENDECTOMY     with explor. lap.  Marland Kitchen CARDIAC CATHETERIZATION    . CHOLECYSTECTOMY  10/28/2002   lap.  . COLONOSCOPY    . DEBRIDEMENT TENNIS ELBOW    . DIAGNOSTIC LAPAROSCOPY    . DILATION AND CURETTAGE OF UTERUS    . ELBOW SURGERY     golfer's elbow-bilateral  . ESOPHAGOGASTRODUODENOSCOPY  09/10/2012   Procedure: ESOPHAGOGASTRODUODENOSCOPY (EGD);  Surgeon: Rogene Houston, MD;  Location: [facility];  Service: Endoscopy;  Laterality: N/A;  730  . EXCISION/RELEASE BURSA HIP Left 02/04/2016   Procedure: LEFT HIP TROCHANTERIC BURSECTOMY;  Surgeon: Mcarthur Rossetti, MD;  Location: [facility];  Service: Orthopedics;  Laterality: Left;  . I&D EXTREMITY  02/25/2012   Procedure: IRRIGATION AND DEBRIDEMENT EXTREMITY;  Surgeon: Roseanne Kaufman, MD;  Location: [facility];  Service: Orthopedics;  Laterality: Right;  Irrigation and Debridement and Repair Right Thumb Nerve Laceration and Assoiated Structures   . KNEE DEBRIDEMENT     right  . LAPAROTOMY    . LEFT HEART CATHETERIZATION WITH CORONARY ANGIOGRAM N/A 01/16/2013   Procedure: LEFT HEART CATHETERIZATION WITH CORONARY ANGIOGRAM;  Surgeon: Jolaine Artist, MD;  Location: [facility] C[facility];  Service: Cardiovascular;  Laterality: N/A;  . STAPEDECTOMY     right  . TONSILLECTOMY AND ADENOIDECTOMY    . TRIGGER FINGER RELEASE  08/19/2011   Procedure: RELEASE TRIGGER FINGER/A-1 PULLEY;  Surgeon: Willa Frater III;  Location: [facility];  Service: Orthopedics;  Laterality: Right;  right middle finger a-1 pulley release with tenosynovectomy  . TROCHANTERIC BURSA EXCISION Right 02/04/2016  . TUBAL LIGATION    . TYMPANOPLASTY      Social History   Social History  . Marital status: Married    Spouse name: N/A  . Number of  children: 2  . Years of education: 14   Occupational History  . Not on file.   Social History Main Topics  . Smoking status: Never Smoker  . Smokeless tobacco: Never Used  . Alcohol use No  . Drug use: No  . Sexual activity: Not Currently    Birth control/ protection: Surgical   Other Topics Concern  . Not on file   Social History Narrative   Lives in [place] with husband.  Grown children.     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   Caffeine use: none     Vitals:   01/13/17 1051  BP: 122/70  Pulse: 70  SpO2: 97%  Weight: 144 lb 6.4 oz (65.5 kg)  Height: 5' 7.5" (1.715 m)    Wt Readings from Last 3 Encounters:  01/13/17 144 lb 6.4 oz (65.5 kg)  12/27/16 144 lb 12.8 oz (65.7 kg)  11/29/16 139 lb (63 kg)     PHYSICAL EXAM General: NAD HEENT: Normal. Neck: No JVD, no thyromegaly. Lungs: Clear to auscultation bilaterally with normal respiratory effort. CV: Nondisplaced PMI.  Regular rate and rhythm, normal S1/S2, no S3/S4, no murmur. No pretibial or periankle edema.   Abdomen: Soft, nontender, no distention.  Neurologic: Alert and oriented.  Psych: Normal affect. Skin: Normal. Musculoskeletal: No gross deformities.    ECG: Most recent ECG reviewed.   Labs: Lab Results  Component Value Date/Time   K 4.4 02/01/2016 10:53 AM   BUN 10 02/01/2016 10:53 AM   CREATININE 0.86 02/01/2016 10:53 AM   CREATININE 0.79 07/16/2014 05:07 PM   ALT 16 06/17/2015 02:08 PM   TSH 3.061 10/20/2014 09:05 AM   HGB 14.9 02/01/2016 10:53 AM     Lipids: Lab Results  Component Value Date/Time   LDLCALC 52 10/20/2014 09:05 AM   CHOL 140 10/20/2014 09:05 AM   TRIG 76 10/20/2014 09:05 AM   HDL 73 10/20/2014 09:05 AM       ASSESSMENT AND PLAN: 1. CAD: Symptomatically stable. No changes to therapy. Continue aspirin, Ranexa, Isordil, and Lipitor.  2. Venous varicosities: Continues compression stocking use, 30-40 mmHg.  3. Hyperlipidemia:  Continue Lipitor 40 mg.    Disposition: Follow up 1 year.  Kate Sable, M.D., F.A.C.C.

## 2017-01-17 ENCOUNTER — Telehealth (INDEPENDENT_AMBULATORY_CARE_PROVIDER_SITE_OTHER): Payer: Self-pay | Admitting: Internal Medicine

## 2017-01-17 ENCOUNTER — Ambulatory Visit (INDEPENDENT_AMBULATORY_CARE_PROVIDER_SITE_OTHER): Payer: BLUE CROSS/BLUE SHIELD | Admitting: Psychiatry

## 2017-01-17 ENCOUNTER — Encounter (HOSPITAL_COMMUNITY): Payer: Self-pay | Admitting: Psychiatry

## 2017-01-17 DIAGNOSIS — F331 Major depressive disorder, recurrent, moderate: Secondary | ICD-10-CM | POA: Diagnosis not present

## 2017-01-17 DIAGNOSIS — K219 Gastro-esophageal reflux disease without esophagitis: Secondary | ICD-10-CM

## 2017-01-17 MED ORDER — DEXLANSOPRAZOLE 60 MG PO CPDR: 60.0000 mg | DELAYED_RELEASE_CAPSULE | Freq: Every day | ORAL | 11 refills | Status: DC

## 2017-01-17 NOTE — Telephone Encounter (Signed)
Patient presented to the office, stated that the [place] samples worked well and now needs a prescription.  She'd like it called to [facility] outpatient pharmacy.  [phone]

## 2017-01-17 NOTE — Telephone Encounter (Signed)
Done
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
## 2017-01-17 NOTE — Telephone Encounter (Signed)
Rx for Dexilant sent to her pharmacy 

## 2017-01-31 ENCOUNTER — Encounter (HOSPITAL_COMMUNITY): Payer: Self-pay | Admitting: Psychiatry

## 2017-01-31 ENCOUNTER — Ambulatory Visit (INDEPENDENT_AMBULATORY_CARE_PROVIDER_SITE_OTHER): Payer: BLUE CROSS/BLUE SHIELD | Admitting: Psychiatry

## 2017-01-31 ENCOUNTER — Telehealth (HOSPITAL_COMMUNITY): Payer: Self-pay | Admitting: *Deleted

## 2017-01-31 DIAGNOSIS — F331 Major depressive disorder, recurrent, moderate: Secondary | ICD-10-CM | POA: Diagnosis not present

## 2017-01-31 NOTE — Telephone Encounter (Signed)
left voice message, provider out of office 02/14/17.
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
## 2017-02-07 ENCOUNTER — Ambulatory Visit (INDEPENDENT_AMBULATORY_CARE_PROVIDER_SITE_OTHER): Payer: BLUE CROSS/BLUE SHIELD | Admitting: Psychiatry

## 2017-02-07 ENCOUNTER — Encounter (HOSPITAL_COMMUNITY): Payer: Self-pay | Admitting: Psychiatry

## 2017-02-07 VITALS — BP 106/69 | HR 63 | Ht 67.5 in | Wt 149.4 lb

## 2017-02-07 DIAGNOSIS — M797 Fibromyalgia: Secondary | ICD-10-CM

## 2017-02-07 DIAGNOSIS — F331 Major depressive disorder, recurrent, moderate: Secondary | ICD-10-CM | POA: Diagnosis not present

## 2017-02-07 DIAGNOSIS — I2510 Atherosclerotic heart disease of native coronary artery without angina pectoris: Secondary | ICD-10-CM | POA: Diagnosis not present

## 2017-02-07 DIAGNOSIS — Z818 Family history of other mental and behavioral disorders: Secondary | ICD-10-CM | POA: Diagnosis not present

## 2017-02-07 DIAGNOSIS — F419 Anxiety disorder, unspecified: Secondary | ICD-10-CM

## 2017-02-07 DIAGNOSIS — K589 Irritable bowel syndrome without diarrhea: Secondary | ICD-10-CM | POA: Diagnosis not present

## 2017-02-07 DIAGNOSIS — M7060 Trochanteric bursitis, unspecified hip: Secondary | ICD-10-CM | POA: Diagnosis not present

## 2017-02-07 MED ORDER — MIRTAZAPINE 15 MG PO TABS: 15.0000 mg | ORAL_TABLET | Freq: Every day | ORAL | 1 refills | Status: DC

## 2017-02-07 MED ORDER — TRAZODONE HCL 50 MG PO TABS: 50.0000 mg | ORAL_TABLET | Freq: Every evening | ORAL | 1 refills | Status: DC | PRN

## 2017-02-07 MED ORDER — DULOXETINE HCL 60 MG PO CPEP: 60.0000 mg | ORAL_CAPSULE | Freq: Every day | ORAL | 1 refills | Status: DC

## 2017-02-07 NOTE — Patient Instructions (Addendum)
1. Continue duloxetine 60 mg daily  2. Continue Mirtazapine 15 mg at night  3. Continue Trazodone 50 mg at night as needed for sleep 4. Return to clinic in one month 5. Consider writing down three things you accomplished in a day and the reason why you could do it.

## 2017-02-14 ENCOUNTER — Ambulatory Visit (HOSPITAL_COMMUNITY): Payer: Self-pay | Admitting: Psychiatry

## 2017-02-15 MED FILL — DULoxetine HCL 60 MG CPEP: 60 | 30 days supply | Qty: 30 | Fill #0

## 2017-02-15 MED FILL — MIRTAZAPINE 15 MG TABLET: 15 | 30 days supply | Qty: 30 | Fill #0

## 2017-02-15 MED FILL — traZODone HCL 50 MG TABS: 50 | 30 days supply | Qty: 30 | Fill #0

## 2017-02-27 ENCOUNTER — Telehealth (HOSPITAL_COMMUNITY): Payer: Self-pay | Admitting: *Deleted

## 2017-02-27 NOTE — Telephone Encounter (Signed)
SENT FAX TO [facility]CEL REQUEST FOR RECORDS

## 2017-02-28 ENCOUNTER — Ambulatory Visit (INDEPENDENT_AMBULATORY_CARE_PROVIDER_SITE_OTHER): Payer: BLUE CROSS/BLUE SHIELD | Admitting: Psychiatry

## 2017-02-28 ENCOUNTER — Encounter (HOSPITAL_COMMUNITY): Payer: Self-pay | Admitting: Psychiatry

## 2017-02-28 DIAGNOSIS — F331 Major depressive disorder, recurrent, moderate: Secondary | ICD-10-CM | POA: Diagnosis not present
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
## 2017-03-07 ENCOUNTER — Ambulatory Visit (HOSPITAL_COMMUNITY): Payer: Self-pay | Admitting: Psychiatry

## 2017-03-08 ENCOUNTER — Encounter (HOSPITAL_COMMUNITY): Payer: Self-pay | Admitting: Psychiatry

## 2017-03-08 ENCOUNTER — Ambulatory Visit (INDEPENDENT_AMBULATORY_CARE_PROVIDER_SITE_OTHER): Payer: BLUE CROSS/BLUE SHIELD | Admitting: Psychiatry

## 2017-03-08 ENCOUNTER — Other Ambulatory Visit (HOSPITAL_COMMUNITY): Payer: Self-pay | Admitting: Geriatric Medicine

## 2017-03-08 VITALS — BP 120/64 | HR 69 | Ht 67.5 in | Wt 148.0 lb

## 2017-03-08 DIAGNOSIS — K589 Irritable bowel syndrome without diarrhea: Secondary | ICD-10-CM | POA: Diagnosis not present

## 2017-03-08 DIAGNOSIS — M797 Fibromyalgia: Secondary | ICD-10-CM

## 2017-03-08 DIAGNOSIS — F329 Major depressive disorder, single episode, unspecified: Secondary | ICD-10-CM | POA: Diagnosis not present

## 2017-03-08 DIAGNOSIS — Z818 Family history of other mental and behavioral disorders: Secondary | ICD-10-CM | POA: Diagnosis not present

## 2017-03-08 DIAGNOSIS — Z7982 Long term (current) use of aspirin: Secondary | ICD-10-CM | POA: Diagnosis not present

## 2017-03-08 DIAGNOSIS — M7060 Trochanteric bursitis, unspecified hip: Secondary | ICD-10-CM | POA: Diagnosis not present

## 2017-03-08 DIAGNOSIS — I2510 Atherosclerotic heart disease of native coronary artery without angina pectoris: Secondary | ICD-10-CM | POA: Diagnosis not present

## 2017-03-08 DIAGNOSIS — Z79899 Other long term (current) drug therapy: Secondary | ICD-10-CM

## 2017-03-08 DIAGNOSIS — Z885 Allergy status to narcotic agent status: Secondary | ICD-10-CM | POA: Diagnosis not present

## 2017-03-08 DIAGNOSIS — F419 Anxiety disorder, unspecified: Secondary | ICD-10-CM | POA: Diagnosis not present

## 2017-03-08 DIAGNOSIS — F331 Major depressive disorder, recurrent, moderate: Secondary | ICD-10-CM

## 2017-03-08 DIAGNOSIS — Z1231 Encounter for screening mammogram for malignant neoplasm of breast: Secondary | ICD-10-CM

## 2017-03-08 MED ORDER — DULOXETINE HCL 60 MG PO CPEP: 60.0000 mg | ORAL_CAPSULE | Freq: Every day | ORAL | 1 refills | Status: DC

## 2017-03-08 MED ORDER — MIRTAZAPINE 30 MG PO TABS
30.0000 mg | ORAL_TABLET | Freq: Every day | ORAL | 1 refills | Status: DC
Start: 2017-03-08 — End: 2017-04-24

## 2017-03-08 MED ORDER — TRAZODONE HCL 50 MG PO TABS
50.0000 mg | ORAL_TABLET | Freq: Every evening | ORAL | 1 refills | Status: DC | PRN
Start: 2017-03-08 — End: 2017-04-24

## 2017-03-08 NOTE — Patient Instructions (Addendum)
1. Continue duloxetine 60 mg daily 2. Increase Mirtazapine 30 mg at night  3. Continue Trazodone 50 mg at night as needed for sleep 4. Return to clinic in one month
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
## 2017-03-15 MED FILL — traZODone HCL 50 MG TABS: 50 | 30 days supply | Qty: 30 | Fill #1

## 2017-03-15 MED FILL — DULoxetine HCL 60 MG CPEP: 60 | 30 days supply | Qty: 30 | Fill #1

## 2017-03-15 MED FILL — MIRTAZAPINE 15 MG TABLET: 15 | 30 days supply | Qty: 30 | Fill #1

## 2017-03-28 ENCOUNTER — Telehealth: Payer: Self-pay | Admitting: Cardiovascular Disease

## 2017-03-28 ENCOUNTER — Encounter (HOSPITAL_COMMUNITY): Payer: Self-pay | Admitting: Psychiatry

## 2017-03-28 ENCOUNTER — Ambulatory Visit (INDEPENDENT_AMBULATORY_CARE_PROVIDER_SITE_OTHER): Payer: BLUE CROSS/BLUE SHIELD | Admitting: Psychiatry

## 2017-03-28 DIAGNOSIS — F331 Major depressive disorder, recurrent, moderate: Secondary | ICD-10-CM

## 2017-03-28 DIAGNOSIS — R002 Palpitations: Secondary | ICD-10-CM

## 2017-03-28 NOTE — Addendum Note (Signed)
Addended by: Barbarann Ehlers A on: 03/28/2017 11:28 AM   Modules accepted: Orders

## 2017-03-28 NOTE — Telephone Encounter (Signed)
One week event monitor.

## 2017-03-28 NOTE — Telephone Encounter (Signed)
Has never experienced palpitations until a couple of weeks ago.Now is having them more frequent, says HR in 80's.

## 2017-03-28 NOTE — Telephone Encounter (Signed)
Pt has been having palpitations for the past few weeks, but last night it got worse. Please give pt a call @ [phone] cell [phone]
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
## 2017-03-28 NOTE — Telephone Encounter (Signed)
Order placed for event monitor, pt notified

## 2017-03-30 ENCOUNTER — Ambulatory Visit (HOSPITAL_COMMUNITY)
Admission: RE | Admit: 2017-03-30 | Discharge: 2017-03-30 | Disposition: A | Discharge status: Home / Self Care | Payer: BLUE CROSS/BLUE SHIELD | Source: Ambulatory Visit | Attending: Geriatric Medicine | Admitting: Geriatric Medicine

## 2017-03-30 ENCOUNTER — Ambulatory Visit (HOSPITAL_COMMUNITY): Payer: Self-pay

## 2017-03-30 DIAGNOSIS — Z1231 Encounter for screening mammogram for malignant neoplasm of breast: Secondary | ICD-10-CM | POA: Diagnosis present

## 2017-04-06 ENCOUNTER — Encounter (INDEPENDENT_AMBULATORY_CARE_PROVIDER_SITE_OTHER): Payer: BLUE CROSS/BLUE SHIELD

## 2017-04-06 DIAGNOSIS — R002 Palpitations: Secondary | ICD-10-CM | POA: Diagnosis not present

## 2017-04-11 ENCOUNTER — Ambulatory Visit (INDEPENDENT_AMBULATORY_CARE_PROVIDER_SITE_OTHER): Payer: BLUE CROSS/BLUE SHIELD | Admitting: Psychiatry

## 2017-04-11 ENCOUNTER — Encounter (HOSPITAL_COMMUNITY): Payer: Self-pay | Admitting: Psychiatry

## 2017-04-11 DIAGNOSIS — F331 Major depressive disorder, recurrent, moderate: Secondary | ICD-10-CM
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
## 2017-04-24 ENCOUNTER — Encounter (HOSPITAL_COMMUNITY): Payer: Self-pay | Admitting: Psychiatry

## 2017-04-24 ENCOUNTER — Ambulatory Visit (INDEPENDENT_AMBULATORY_CARE_PROVIDER_SITE_OTHER): Payer: BLUE CROSS/BLUE SHIELD | Admitting: Psychiatry

## 2017-04-24 VITALS — BP 125/70 | HR 72 | Ht 67.5 in | Wt 151.0 lb

## 2017-04-24 DIAGNOSIS — G4700 Insomnia, unspecified: Secondary | ICD-10-CM | POA: Diagnosis not present

## 2017-04-24 DIAGNOSIS — Z818 Family history of other mental and behavioral disorders: Secondary | ICD-10-CM

## 2017-04-24 DIAGNOSIS — F331 Major depressive disorder, recurrent, moderate: Secondary | ICD-10-CM | POA: Diagnosis not present

## 2017-04-24 DIAGNOSIS — M549 Dorsalgia, unspecified: Secondary | ICD-10-CM | POA: Diagnosis not present

## 2017-04-24 MED ORDER — DULOXETINE HCL 60 MG PO CPEP: 60.0000 mg | ORAL_CAPSULE | Freq: Every day | ORAL | 1 refills | Status: DC

## 2017-04-24 MED ORDER — TRAZODONE HCL 50 MG PO TABS
50.0000 mg | ORAL_TABLET | Freq: Every evening | ORAL | 1 refills | Status: DC | PRN
Start: 2017-04-24 — End: 2017-05-31

## 2017-04-24 MED ORDER — MIRTAZAPINE 30 MG PO TABS: 30.0000 mg | ORAL_TABLET | Freq: Every day | ORAL | 1 refills | Status: DC

## 2017-04-24 MED FILL — MIRTAZAPINE 30 MG TABLET: 30 | 30 days supply | Qty: 30 | Fill #0

## 2017-04-24 MED FILL — traZODone HCL 50 MG TABS: 50 | 30 days supply | Qty: 30 | Fill #0

## 2017-04-24 MED FILL — DULoxetine HCL 60 MG CPEP: 60 | 30 days supply | Qty: 30 | Fill #0

## 2017-04-24 NOTE — Patient Instructions (Signed)
1. Continue duloxetine 60 mg daily 2. Continue mirtazapine 30 mg at night 3. Continue Trazodone 50 mg at night as needed for sleep 4. Return to clinic in five weeks
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
## 2017-05-02 ENCOUNTER — Ambulatory Visit (INDEPENDENT_AMBULATORY_CARE_PROVIDER_SITE_OTHER): Payer: BLUE CROSS/BLUE SHIELD | Admitting: Psychiatry

## 2017-05-02 ENCOUNTER — Encounter (HOSPITAL_COMMUNITY): Payer: Self-pay | Admitting: Psychiatry

## 2017-05-02 DIAGNOSIS — F331 Major depressive disorder, recurrent, moderate: Secondary | ICD-10-CM
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
## 2017-05-11 ENCOUNTER — Telehealth (HOSPITAL_COMMUNITY): Payer: Self-pay | Admitting: *Deleted

## 2017-05-11 NOTE — Telephone Encounter (Signed)
returned phone call to patient regarding an appointment with Dr. Modesta Messing.  no answer, left voice message.

## 2017-05-15 ENCOUNTER — Telehealth (HOSPITAL_COMMUNITY): Payer: Self-pay | Admitting: *Deleted

## 2017-05-15 NOTE — Telephone Encounter (Signed)
returned phone call regarding appointment on wed. 05/17/17, no answer, left voice message.

## 2017-05-16 ENCOUNTER — Ambulatory Visit (HOSPITAL_COMMUNITY): Payer: Self-pay | Admitting: Psychiatry

## 2017-05-17 ENCOUNTER — Ambulatory Visit (HOSPITAL_COMMUNITY): Payer: Self-pay | Admitting: Psychiatry

## 2017-05-19 ENCOUNTER — Encounter (HOSPITAL_COMMUNITY): Payer: Self-pay | Admitting: Psychiatry

## 2017-05-19 ENCOUNTER — Ambulatory Visit (INDEPENDENT_AMBULATORY_CARE_PROVIDER_SITE_OTHER): Payer: BLUE CROSS/BLUE SHIELD | Admitting: Psychiatry

## 2017-05-19 DIAGNOSIS — F331 Major depressive disorder, recurrent, moderate: Secondary | ICD-10-CM | POA: Diagnosis not present
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
## 2017-05-26 ENCOUNTER — Telehealth (HOSPITAL_COMMUNITY): Payer: Self-pay | Admitting: *Deleted

## 2017-05-26 DIAGNOSIS — Z23 Encounter for immunization: Secondary | ICD-10-CM | POA: Diagnosis not present

## 2017-05-26 DIAGNOSIS — F5101 Primary insomnia: Secondary | ICD-10-CM | POA: Diagnosis not present

## 2017-05-26 DIAGNOSIS — Z6820 Body mass index (BMI) 20.0-20.9, adult: Secondary | ICD-10-CM | POA: Diagnosis not present

## 2017-05-26 DIAGNOSIS — F438 Other reactions to severe stress: Secondary | ICD-10-CM | POA: Diagnosis not present

## 2017-05-26 DIAGNOSIS — G43001 Migraine without aura, not intractable, with status migrainosus: Secondary | ICD-10-CM | POA: Diagnosis not present

## 2017-05-26 DIAGNOSIS — M25559 Pain in unspecified hip: Secondary | ICD-10-CM | POA: Diagnosis not present

## 2017-05-26 DIAGNOSIS — K589 Irritable bowel syndrome without diarrhea: Secondary | ICD-10-CM | POA: Diagnosis not present

## 2017-05-26 DIAGNOSIS — M6530 Trigger finger, unspecified finger: Secondary | ICD-10-CM | POA: Diagnosis not present

## 2017-05-26 DIAGNOSIS — F39 Unspecified mood [affective] disorder: Secondary | ICD-10-CM | POA: Diagnosis not present

## 2017-05-26 NOTE — Telephone Encounter (Signed)
left voice message, provider out of office 06/13/17.   will see patient on 06/01/17.
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
## 2017-05-31 ENCOUNTER — Encounter (HOSPITAL_COMMUNITY): Payer: Self-pay | Admitting: Psychiatry

## 2017-05-31 ENCOUNTER — Ambulatory Visit (INDEPENDENT_AMBULATORY_CARE_PROVIDER_SITE_OTHER): Payer: BLUE CROSS/BLUE SHIELD | Admitting: Psychiatry

## 2017-05-31 VITALS — BP 115/63 | HR 60 | Ht 67.6 in | Wt 150.8 lb

## 2017-05-31 DIAGNOSIS — F331 Major depressive disorder, recurrent, moderate: Secondary | ICD-10-CM

## 2017-05-31 DIAGNOSIS — Z818 Family history of other mental and behavioral disorders: Secondary | ICD-10-CM

## 2017-05-31 MED ORDER — MIRTAZAPINE 30 MG PO TABS: 30.0000 mg | ORAL_TABLET | Freq: Every day | ORAL | 1 refills | Status: DC

## 2017-05-31 MED ORDER — DULOXETINE HCL 30 MG PO CPEP: 90.0000 mg | ORAL_CAPSULE | Freq: Every day | ORAL | 1 refills | Status: DC

## 2017-05-31 MED ORDER — TRAZODONE HCL 50 MG PO TABS: 50.0000 mg | ORAL_TABLET | Freq: Every evening | ORAL | 1 refills | Status: DC | PRN

## 2017-05-31 MED FILL — traZODone HCL 50 MG TABS: 50 | 30 days supply | Qty: 30 | Fill #0

## 2017-05-31 MED FILL — DULoxetine HCL 30 MG CPEP: 30 | 30 days supply | Qty: 90 | Fill #0

## 2017-05-31 NOTE — Patient Instructions (Signed)
1. Increase duloxetine 90 mg daily 2. Continue mirtazapine 30 mg a night 3. Continue Trazodone 50 mg at night as needed for sleep 4. Return to clinic in one month

## 2017-06-01 ENCOUNTER — Ambulatory Visit (INDEPENDENT_AMBULATORY_CARE_PROVIDER_SITE_OTHER): Payer: BLUE CROSS/BLUE SHIELD | Admitting: Psychiatry

## 2017-06-01 ENCOUNTER — Encounter (HOSPITAL_COMMUNITY): Payer: Self-pay | Admitting: Psychiatry

## 2017-06-01 DIAGNOSIS — F331 Major depressive disorder, recurrent, moderate: Secondary | ICD-10-CM | POA: Diagnosis not present
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
## 2017-06-13 ENCOUNTER — Ambulatory Visit (HOSPITAL_COMMUNITY): Payer: Self-pay | Admitting: Psychiatry

## 2017-06-16 ENCOUNTER — Encounter: Payer: Self-pay | Admitting: Neurology

## 2017-06-16 ENCOUNTER — Ambulatory Visit (INDEPENDENT_AMBULATORY_CARE_PROVIDER_SITE_OTHER): Payer: BLUE CROSS/BLUE SHIELD | Admitting: Neurology

## 2017-06-16 VITALS — BP 119/70 | HR 70 | Wt 152.0 lb

## 2017-06-16 DIAGNOSIS — R251 Tremor, unspecified: Secondary | ICD-10-CM | POA: Diagnosis not present

## 2017-06-16 DIAGNOSIS — G43019 Migraine without aura, intractable, without status migrainosus: Secondary | ICD-10-CM | POA: Diagnosis not present

## 2017-06-16 HISTORY — DX: Migraine without aura, intractable, without status migrainosus: G43.019

## 2017-06-16 MED ORDER — GABAPENTIN 100 MG PO CAPS: 100.0000 mg | ORAL_CAPSULE | Freq: Three times a day (TID) | ORAL | 3 refills | Status: DC

## 2017-06-16 MED FILL — GABAPENTIN 100 MG CAP: 100 | 30 days supply | Qty: 90 | Fill #0

## 2017-06-16 NOTE — Progress Notes (Signed)
Reason for visit: Tremor  Stacey Lawrence is an 66 y.o. female  History of present illness:  Stacey Lawrence is a 66 year old right-handed white female with history of an essential tremor. The patient indicates that she is having difficulty performing tasks that require fine motor control. She likes to paint and had to give up painting detailed projects. She also has had to give up sewing in part. The patient has tremors on both arms. She denies any vocal tremor or head or neck tremor. She also has had an increase in her migraine headaches that are now on average 2 times a week, oftentimes coming on at night and may respond to caffeinated products. The patient has been on Cymbalta, the dose was recently increased one week ago. The patient claims that her maternal grandmother also had a similar essential tremor. The patient comes the office today for an evaluation.  Past Medical History:  Diagnosis Date  . Anemia    hx  . Anxiety   . CAD (coronary artery disease)    a. 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 05/2013 Lexi CL: EF 77%, old small inferolat scar, no ischemia.  . Depression   . Fibromyalgia   . GERD (gastroesophageal reflux disease)    hasn't started taking any meds  . History of blood transfusion    67yrs ago  . Hx of colonic polyps   . IBS (irritable bowel syndrome)    constipation  . Joint pain   . Joint swelling   . Kidney stones 03/2010  . Migraine    "q other week" (02/04/2016)  . Myocardial infarction ([place])   . Numbness    right leg  . Polycythemia   . PONV (postoperative nausea and vomiting)   . Raynaud's disease   . Scoliosis   . Shortness of breath dyspnea    with exertion    Past Surgical History:  Procedure  Laterality Date  . ABDOMINAL HYSTERECTOMY     partial  . APPENDECTOMY     with explor. lap.  Marland Kitchen CARDIAC CATHETERIZATION    . CHOLECYSTECTOMY  10/28/2002   lap.  . COLONOSCOPY    . DEBRIDEMENT TENNIS ELBOW    . DIAGNOSTIC LAPAROSCOPY    . DILATION AND CURETTAGE OF UTERUS    . ELBOW SURGERY     golfer's elbow-bilateral  . ESOPHAGOGASTRODUODENOSCOPY  09/10/2012   Procedure: ESOPHAGOGASTRODUODENOSCOPY (EGD);  Surgeon: Rogene Houston, MD;  [facility];  Service: Endoscopy;  Laterality: N/A;  730  . EXCISION/RELEASE BURSA HIP Left 02/04/2016   Procedure: LEFT HIP TROCHANTERIC BURSECTOMY;  Surgeon: Mcarthur Rossetti, MD;  Location: [facility];  Service: Orthopedics;  Laterality: Left;  . I&D EXTREMITY  02/25/2012   Procedure: IRRIGATION AND DEBRIDEMENT EXTREMITY;  Surgeon: Roseanne Kaufman, MD;  Location: [facility];  Service: Orthopedics;  Laterality: Right;  Irrigation and Debridement and Repair Right Thumb Nerve Laceration and Assoiated Structures   . KNEE DEBRIDEMENT     right  . LAPAROTOMY    . LEFT HEART CATHETERIZATION WITH CORONARY ANGIOGRAM N/A 01/16/2013   Procedure: LEFT HEART CATHETERIZATION WITH CORONARY ANGIOGRAM;  Surgeon: Jolaine Artist, MD;  Location: [facility] CATH LAB;  Service: Cardiovascular;  Laterality: N/A;  . STAPEDECTOMY     right  . TONSILLECTOMY AND ADENOIDECTOMY    . TRIGGER FINGER RELEASE  08/19/2011   Procedure: RELEASE TRIGGER FINGER/A-1 PULLEY;  Surgeon: Willa Frater III;  Location: [facility];  Service: Orthopedics;  Laterality: Right;  right middle finger a-1 pulley release with tenosynovectomy  . TROCHANTERIC BURSA EXCISION Right 02/04/2016  . TUBAL LIGATION    . TYMPANOPLASTY      Family History  Problem Relation Age of Onset  . Cancer Mother        Deceased with lung CA  . Heart failure Mother   . Heart disease Mother   . COPD Mother   . Varicose Veins Mother   . Depression Mother   . Cancer Father        Deceased with lung CA    . Heart disease Father   . Varicose Veins Father   . Depression Father   . Depression Brother   . ADD / ADHD Son     Social history:  reports that she has never smoked. She has never used smokeless tobacco. She reports that she does not drink alcohol or use drugs.    Allergies  Allergen Reactions  . Dilaudid [Hydromorphone Hcl] Other (See Comments)    Resp. arrest  . Codeine Nausea And Vomiting and Rash  . Morphine And Related Nausea And Vomiting    Medications:  Prior to Admission medications   Medication Sig Start Date End Date Taking? Authorizing Provider  acetaminophen (TYLENOL) 325 MG tablet Take 650 mg by mouth every 6 (six) hours as needed for mild pain.   Yes [provider]  ALPRAZolam Duanne Moron) 0.5 MG tablet alprazolam 0.5 mg tablet   Yes [provider]  Ascorbic Acid (VITAMIN C PO) Take 2 tablets by mouth daily.   Yes [provider]  aspirin 81 MG chewable tablet aspirin 81 mg chewable tablet  Chew 1 tablet every day by oral route.   Yes [provider]  atorvastatin (LIPITOR) 40 MG tablet TAKE 1 TABLET BY MOUTH DAILY. 12/08/16  Yes Herminio Commons, MD  atorvastatin (LIPITOR) 40 MG tablet atorvastatin 40 mg tablet   Yes [provider]  Biotin (BIOTIN 5000) 5 MG CAPS Take 1 capsule by mouth daily.   Yes [provider]  Calcium Citrate-Vitamin D (CALCITRATE/VITAMIN D PO) Take 1 tablet by mouth daily.   Yes [provider]  carisoprodol (SOMA) 350 MG tablet Take 350 mg by mouth as needed.    Yes [provider]  celecoxib (CELEBREX) 200 MG capsule celecoxib 200 mg capsule   Yes [provider]  dexlansoprazole (DEXILANT) 60 MG capsule Take 1 capsule (60 mg total) by mouth daily. 01/17/17  Yes Setzer, Terri L, NP  docusate sodium (COLACE) 100 MG capsule Take 100 mg by mouth daily as needed for mild constipation.   Yes [provider]  DULoxetine (CYMBALTA) 30 MG capsule Take 3  capsules (90 mg total) by mouth daily. 05/31/17  Yes Hisada, Elie Goody, MD  gabapentin (NEURONTIN) 300 MG capsule Take 300 mg by mouth as needed.    Yes [provider]  indomethacin (INDOCIN) 25 MG capsule indomethacin 25 mg capsule   Yes [provider]  isosorbide dinitrate (ISORDIL) 10 MG tablet Take 10 mg at night,may take additional 10 mg at 8 am 06/17/16  Yes Herminio Commons, MD  meloxicam (MOBIC) 15 MG tablet meloxicam 15 mg tablet   Yes [provider]  metoprolol succinate (TOPROL-XL) 25 MG 24 hr tablet metoprolol succinate ER 25 mg tablet,extended release 24 hr   Yes [provider]  mirtazapine (REMERON) 30 MG tablet Take 1 tablet (30 mg total) by mouth at bedtime. 05/31/17  Yes Hisada, Elie Goody, MD  nitroGLYCERIN (NITROSTAT) 0.4 MG SL tablet PLACE 1 TABLET UNDER THE TONGUE EVERY 5 MINUTES AS NEEDED FOR CHEST PAIN 09/05/16  Yes Herminio Commons, MD  nitroGLYCERIN (NITROSTAT) 0.4 MG SL tablet nitroglycerin 0.4 mg sublingual tablet   Yes [provider]  pantoprazole (PROTONIX) 40 MG tablet TAKE 1 TABLET BY MOUTH ONCE DAILY 06/06/16  Yes Rehman, Mechele Dawley, MD  promethazine (PHENERGAN) 25 MG tablet Take 25 mg by mouth every 6 (six) hours as needed for nausea or vomiting.   Yes [provider]  ranolazine (RANEXA) 1000 MG SR tablet Take 1 tablet (1,000 mg total) by mouth 2 (two) times daily. 01/08/16  Yes Herminio Commons, MD  traZODone (DESYREL) 50 MG tablet Take 1 tablet (50 mg total) by mouth at bedtime as needed for sleep. 05/31/17  Yes Hisada, Elie Goody, MD  Vitamin D, Ergocalciferol, (DRISDOL) 50000 units CAPS capsule Take 50,000 Units by mouth every 7 (seven) days.    Yes [provider]  zolpidem (AMBIEN) 5 MG tablet zolpidem 5 mg tablet   Yes [provider]    ROS:  Out of a complete 14 system review of symptoms, the patient complains only of the following symptoms, and all other reviewed systems are  negative.  Fatigue Hearing loss, ringing in the ears Shortness of breath Leg swelling Cold intolerance, heat intolerance Restless legs, insomnia, frequent waking Frequency of urination, urinary urgency Joint pain, back pain, achy muscles, muscle cramps, neck pain, neck stiffness Memory loss, headache, speech difficulty, weakness, tremors Confusion, depression, anxiety  Blood pressure 119/70, pulse 70, weight 152 lb (68.9 kg).  Physical Exam  General: The patient is alert and cooperative at the time of the examination.  Skin: No significant peripheral edema is noted.   Neurologic Exam  Mental status: The patient is alert and oriented x 3 at the time of the examination. The patient has apparent normal recent and remote memory, with an apparently normal attention span and concentration ability.   Cranial nerves: Facial symmetry is present. Speech is normal, no aphasia or dysarthria is noted. Extraocular movements are full. Visual fields are full.  Motor: The patient has good strength in all 4 extremities.  Sensory examination: Soft touch sensation is symmetric on the face, arms, and legs.  Coordination: The patient has good finger-nose-finger and heel-to-shin bilaterally. No significant tremor was seen with arms outstretched. The patient is able to draw a spiral without significant tremor.  Gait and station: The patient has a normal gait. Good bilateral arm swing is seen. Tandem gait is normal. Romberg is negative. No drift is seen.  Reflexes: Deep tendon reflexes are symmetric.   Assessment/Plan:  1. Essential tremor  2. Common migraine headache  The patient will be placed on gabapentin taking 100 mg 3 times daily. This may help the migraine as well as the tremor. If the migraines significantly worsen in the future, it is possible that the Cymbalta may be exacerbating the headache. The patient will follow-up in 6 months.   Jill Alexanders MD 06/16/2017 9:07  AM  [facility] [address] [place], [place] [address]  Phone [phone] Fax [phone]

## 2017-06-22 ENCOUNTER — Ambulatory Visit (INDEPENDENT_AMBULATORY_CARE_PROVIDER_SITE_OTHER): Payer: BLUE CROSS/BLUE SHIELD | Admitting: Psychiatry

## 2017-06-22 ENCOUNTER — Encounter (HOSPITAL_COMMUNITY): Payer: Self-pay | Admitting: Psychiatry

## 2017-06-22 DIAGNOSIS — F331 Major depressive disorder, recurrent, moderate: Secondary | ICD-10-CM
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
## 2017-06-27 ENCOUNTER — Encounter (HOSPITAL_COMMUNITY): Payer: Self-pay | Admitting: Psychiatry

## 2017-06-27 ENCOUNTER — Ambulatory Visit (INDEPENDENT_AMBULATORY_CARE_PROVIDER_SITE_OTHER): Payer: BLUE CROSS/BLUE SHIELD | Admitting: Psychiatry

## 2017-06-27 VITALS — BP 128/68 | HR 56 | Ht 67.6 in | Wt 151.6 lb

## 2017-06-27 DIAGNOSIS — Z818 Family history of other mental and behavioral disorders: Secondary | ICD-10-CM

## 2017-06-27 DIAGNOSIS — G4700 Insomnia, unspecified: Secondary | ICD-10-CM | POA: Diagnosis not present

## 2017-06-27 DIAGNOSIS — I2510 Atherosclerotic heart disease of native coronary artery without angina pectoris: Secondary | ICD-10-CM

## 2017-06-27 DIAGNOSIS — R45851 Suicidal ideations: Secondary | ICD-10-CM | POA: Diagnosis not present

## 2017-06-27 DIAGNOSIS — F331 Major depressive disorder, recurrent, moderate: Secondary | ICD-10-CM | POA: Diagnosis not present

## 2017-06-27 DIAGNOSIS — M797 Fibromyalgia: Secondary | ICD-10-CM

## 2017-06-27 DIAGNOSIS — Z79899 Other long term (current) drug therapy: Secondary | ICD-10-CM

## 2017-06-27 DIAGNOSIS — K589 Irritable bowel syndrome without diarrhea: Secondary | ICD-10-CM | POA: Diagnosis not present

## 2017-06-27 MED ORDER — MIRTAZAPINE 30 MG PO TABS: 30.0000 mg | ORAL_TABLET | Freq: Every day | ORAL | 0 refills | Status: DC

## 2017-06-27 MED ORDER — DULOXETINE HCL 60 MG PO CPEP: ORAL_CAPSULE | ORAL | 0 refills | Status: DC

## 2017-06-27 MED ORDER — DULOXETINE HCL 30 MG PO CPEP: ORAL_CAPSULE | ORAL | 0 refills | Status: DC

## 2017-06-27 NOTE — Patient Instructions (Signed)
1. Continue duloxetine 90 mg daily  2. Continue mirtazapine 30 mg at night 3. Continue Trazodone 50 mg at night as needed for sleep 4. Return to clinic in one month for 30 mins

## 2017-06-28 ENCOUNTER — Telehealth (HOSPITAL_COMMUNITY): Payer: Self-pay | Admitting: Psychiatry

## 2017-06-28 NOTE — Telephone Encounter (Signed)
CPS reports was made to [name]. She will talk with her supervisor. Will receive update through mail.

## 2017-07-06 ENCOUNTER — Encounter (HOSPITAL_COMMUNITY): Payer: Self-pay | Admitting: Psychiatry

## 2017-07-06 ENCOUNTER — Ambulatory Visit (INDEPENDENT_AMBULATORY_CARE_PROVIDER_SITE_OTHER): Payer: BLUE CROSS/BLUE SHIELD | Admitting: Psychiatry

## 2017-07-06 DIAGNOSIS — F331 Major depressive disorder, recurrent, moderate: Secondary | ICD-10-CM
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
## 2017-07-19 IMAGING — DX DG HAND COMPLETE 3+V*L*
3 series · 3 of 3 positions shown · non-contrast
Comparison: MRI of the left fifth finger performed 04/15/2014

CLINICAL DATA: Fell off ladder, with left hand pain, swelling and
bruising. Initial encounter.

EXAM:
LEFT HAND - COMPLETE 3+ VIEW

[hand pa]
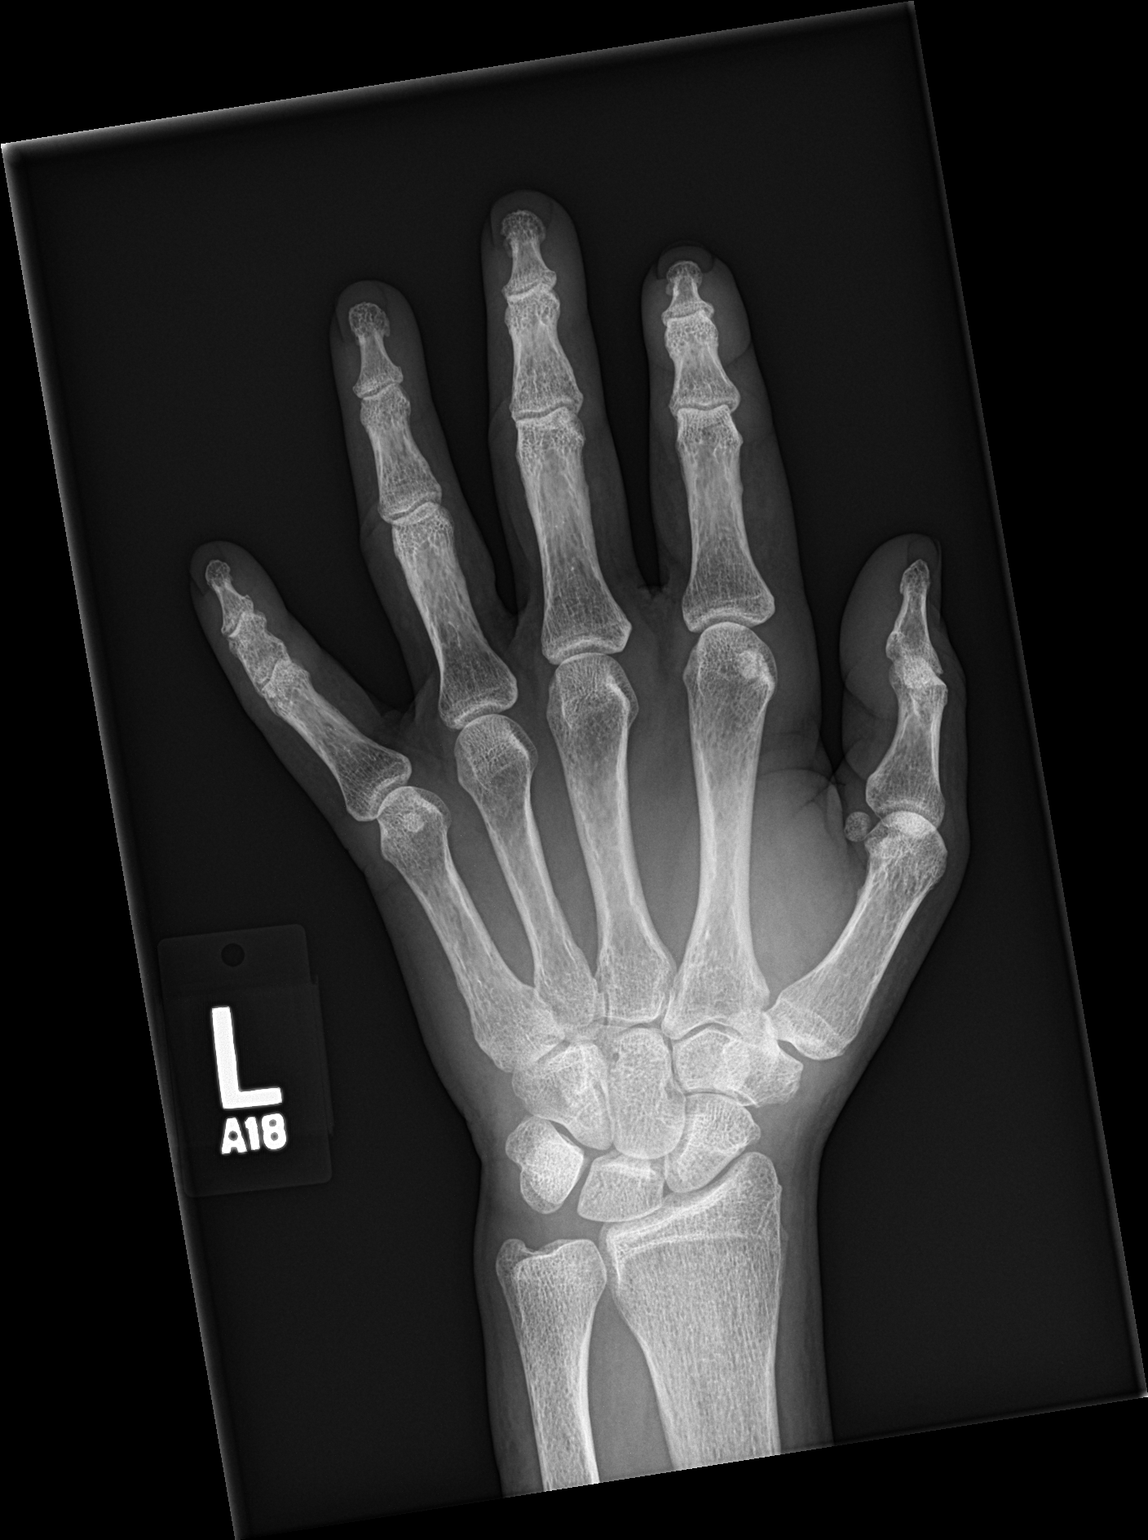

[hand obl]
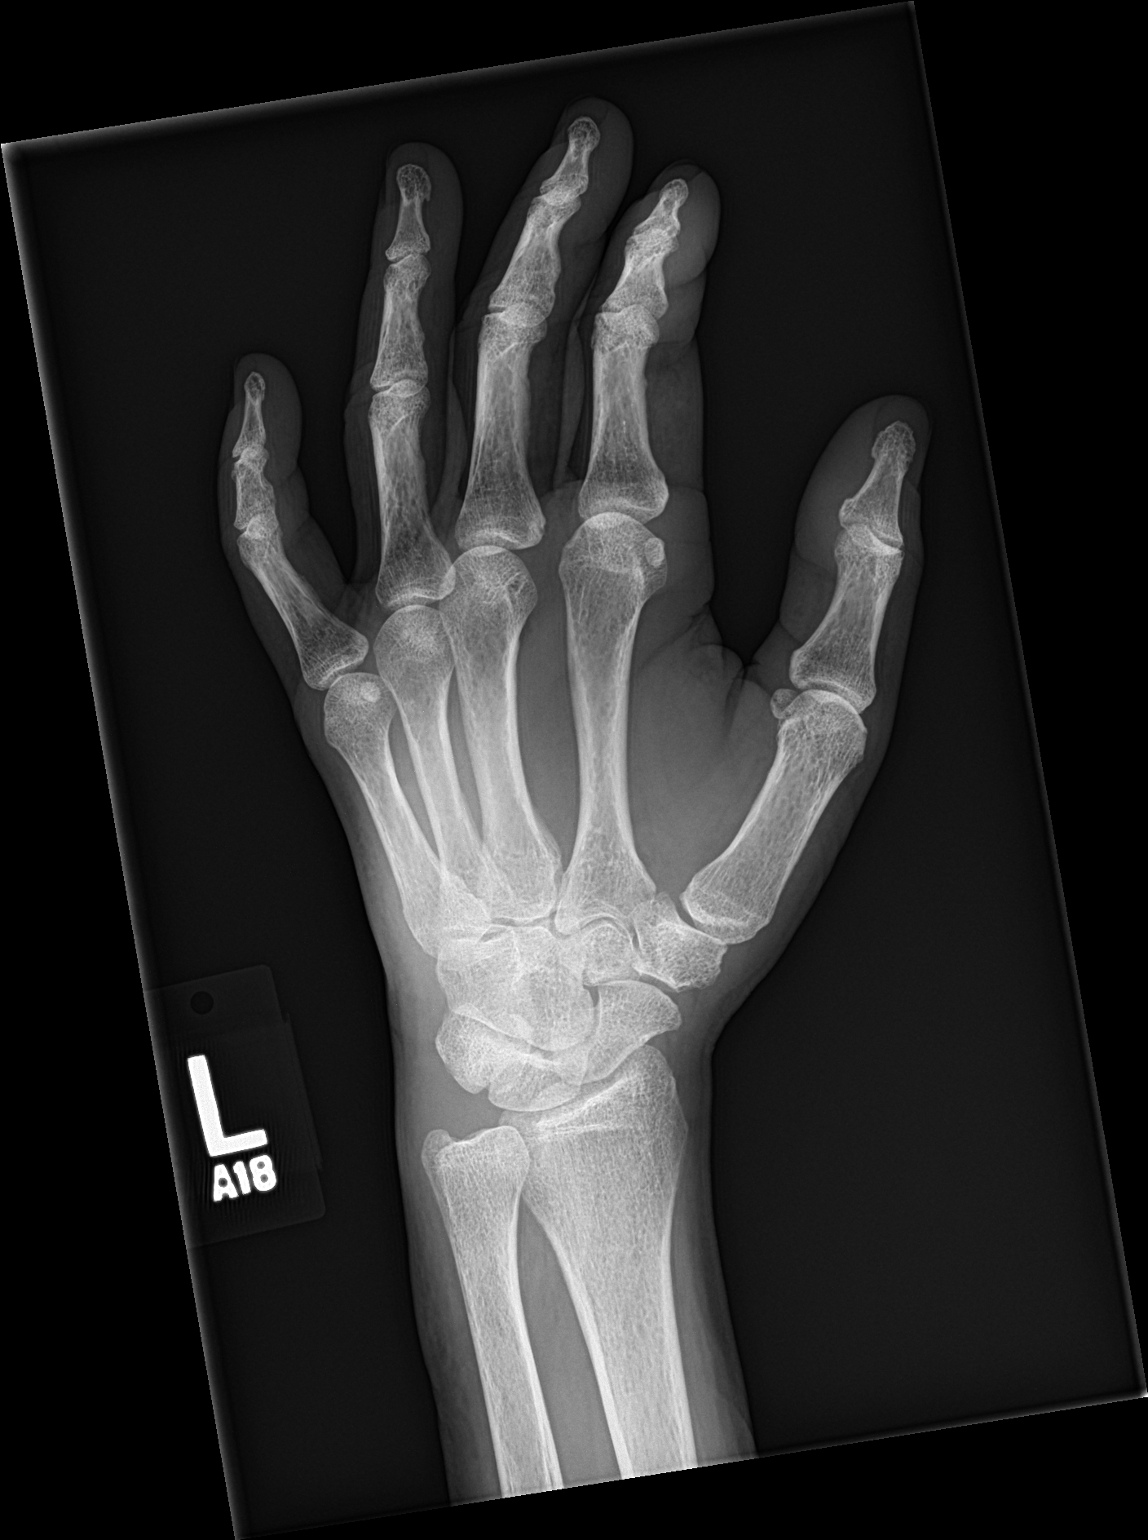

[hand lat]
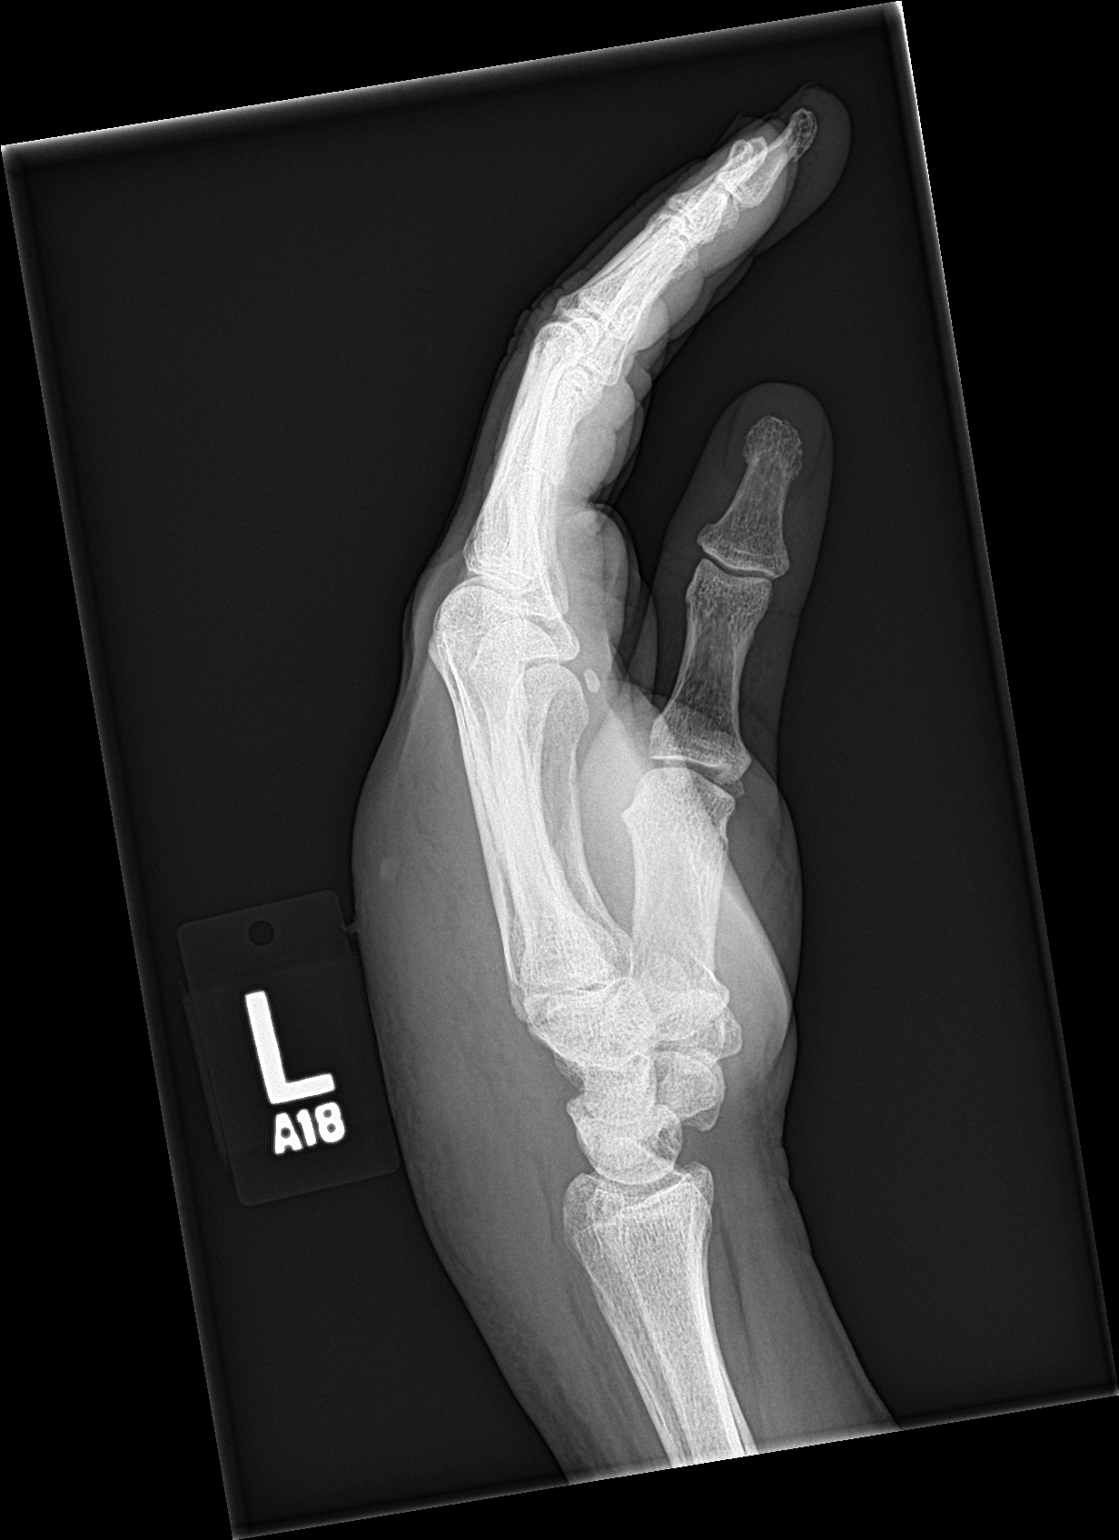

[3 of 3 positions shown; findings below may reference images not displayed]

FINDINGS: There is no evidence of fracture or dislocation. The joint spaces
are preserved. The carpal rows are intact, and demonstrate normal
alignment.

Marked diffuse dorsal soft tissue swelling is noted along the hand
and wrist.
IMPRESSION: No evidence of fracture or dislocation. Marked diffuse dorsal soft
tissue swelling noted.

## 2017-07-19 MED FILL — traZODone HCL 50 MG TABS: 50 | 30 days supply | Qty: 30 | Fill #1

## 2017-07-20 ENCOUNTER — Encounter (HOSPITAL_COMMUNITY): Payer: Self-pay | Admitting: Psychiatry

## 2017-07-20 ENCOUNTER — Ambulatory Visit (INDEPENDENT_AMBULATORY_CARE_PROVIDER_SITE_OTHER): Payer: BLUE CROSS/BLUE SHIELD | Admitting: Psychiatry

## 2017-07-20 DIAGNOSIS — F331 Major depressive disorder, recurrent, moderate: Secondary | ICD-10-CM | POA: Diagnosis not present
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
## 2017-07-26 MED FILL — DULoxetine HCL 60 MG CPEP: 60 | 30 days supply | Qty: 30 | Fill #0

## 2017-07-26 MED FILL — DULoxetine HCL 30 MG CPEP: 30 | 30 days supply | Qty: 30 | Fill #0

## 2017-07-27 ENCOUNTER — Ambulatory Visit (INDEPENDENT_AMBULATORY_CARE_PROVIDER_SITE_OTHER): Payer: BLUE CROSS/BLUE SHIELD | Admitting: Psychiatry

## 2017-07-27 ENCOUNTER — Encounter (HOSPITAL_COMMUNITY): Payer: Self-pay | Admitting: Psychiatry

## 2017-07-27 VITALS — BP 115/76 | HR 73 | Ht 67.6 in | Wt 152.8 lb

## 2017-07-27 DIAGNOSIS — F331 Major depressive disorder, recurrent, moderate: Secondary | ICD-10-CM | POA: Diagnosis not present

## 2017-07-27 MED ORDER — TRAZODONE HCL 50 MG PO TABS: 50.0000 mg | ORAL_TABLET | Freq: Every evening | ORAL | 0 refills | Status: DC | PRN

## 2017-07-27 MED ORDER — DULOXETINE HCL 60 MG PO CPEP: ORAL_CAPSULE | ORAL | 0 refills | Status: DC

## 2017-07-27 MED ORDER — MIRTAZAPINE 30 MG PO TABS: 30.0000 mg | ORAL_TABLET | Freq: Every day | ORAL | 0 refills | Status: DC

## 2017-07-27 NOTE — Patient Instructions (Signed)
1. Continue duloxetine 90 mg daily (60 mg + 30 mg ) 2. Continue mirtazapine 30 mg at night  3. Continue Trazodone 50 mg at night as needed for sleep 4. 5. Return to clinic in one month for 30 mins

## 2017-08-03 ENCOUNTER — Ambulatory Visit (INDEPENDENT_AMBULATORY_CARE_PROVIDER_SITE_OTHER): Payer: BLUE CROSS/BLUE SHIELD | Admitting: Psychiatry

## 2017-08-03 ENCOUNTER — Encounter (HOSPITAL_COMMUNITY): Payer: Self-pay | Admitting: Psychiatry

## 2017-08-03 DIAGNOSIS — F331 Major depressive disorder, recurrent, moderate: Secondary | ICD-10-CM
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
## 2017-08-16 MED FILL — GABAPENTIN 100 MG CAP: 100 | 30 days supply | Qty: 90 | Fill #1

## 2017-08-24 ENCOUNTER — Encounter (HOSPITAL_COMMUNITY): Payer: Self-pay | Admitting: Psychiatry

## 2017-08-24 ENCOUNTER — Ambulatory Visit (INDEPENDENT_AMBULATORY_CARE_PROVIDER_SITE_OTHER): Payer: BLUE CROSS/BLUE SHIELD | Admitting: Psychiatry

## 2017-08-24 DIAGNOSIS — F331 Major depressive disorder, recurrent, moderate: Secondary | ICD-10-CM | POA: Diagnosis not present
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
## 2017-09-06 MED FILL — traZODone HCL 50 MG TABS: 50 | 30 days supply | Qty: 30 | Fill #0

## 2017-09-06 MED FILL — DULoxetine HCL 60 MG CPEP: 60 | 30 days supply | Qty: 30 | Fill #0

## 2017-09-06 MED FILL — ATORVASTATIN 40 MG TABLET: 40 | 90 days supply | Qty: 90 | Fill #1

## 2017-09-07 ENCOUNTER — Telehealth (HOSPITAL_COMMUNITY): Payer: Self-pay | Admitting: Psychiatry

## 2017-09-07 ENCOUNTER — Encounter (HOSPITAL_COMMUNITY): Payer: Self-pay | Admitting: Psychiatry

## 2017-09-07 ENCOUNTER — Ambulatory Visit (INDEPENDENT_AMBULATORY_CARE_PROVIDER_SITE_OTHER): Payer: BLUE CROSS/BLUE SHIELD | Admitting: Psychiatry

## 2017-09-07 DIAGNOSIS — Z598 Other problems related to housing and economic circumstances: Secondary | ICD-10-CM

## 2017-09-07 DIAGNOSIS — G8929 Other chronic pain: Secondary | ICD-10-CM

## 2017-09-07 DIAGNOSIS — F331 Major depressive disorder, recurrent, moderate: Secondary | ICD-10-CM | POA: Diagnosis not present

## 2017-09-07 DIAGNOSIS — M549 Dorsalgia, unspecified: Secondary | ICD-10-CM | POA: Diagnosis not present
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
## 2017-09-18 ENCOUNTER — Telehealth (HOSPITAL_COMMUNITY): Payer: Self-pay | Admitting: *Deleted

## 2017-09-18 ENCOUNTER — Encounter (HOSPITAL_COMMUNITY): Payer: Self-pay | Admitting: Psychiatry

## 2017-09-18 ENCOUNTER — Ambulatory Visit (INDEPENDENT_AMBULATORY_CARE_PROVIDER_SITE_OTHER): Payer: BLUE CROSS/BLUE SHIELD | Admitting: Psychiatry

## 2017-09-18 VITALS — BP 126/76 | HR 72 | Ht 67.5 in | Wt 152.0 lb

## 2017-09-18 DIAGNOSIS — R419 Unspecified symptoms and signs involving cognitive functions and awareness: Secondary | ICD-10-CM | POA: Diagnosis not present

## 2017-09-18 DIAGNOSIS — Z818 Family history of other mental and behavioral disorders: Secondary | ICD-10-CM | POA: Diagnosis not present

## 2017-09-18 DIAGNOSIS — R450 Nervousness: Secondary | ICD-10-CM

## 2017-09-18 DIAGNOSIS — F331 Major depressive disorder, recurrent, moderate: Secondary | ICD-10-CM | POA: Diagnosis not present

## 2017-09-18 DIAGNOSIS — G4700 Insomnia, unspecified: Secondary | ICD-10-CM | POA: Diagnosis not present

## 2017-09-18 MED ORDER — TRAZODONE HCL 50 MG PO TABS: 50.0000 mg | ORAL_TABLET | Freq: Every evening | ORAL | 0 refills | Status: DC | PRN

## 2017-09-18 MED ORDER — MIRTAZAPINE 30 MG PO TABS: 30.0000 mg | ORAL_TABLET | Freq: Every day | ORAL | 0 refills | Status: DC

## 2017-09-18 MED ORDER — DULOXETINE HCL 60 MG PO CPEP: ORAL_CAPSULE | ORAL | 0 refills | Status: DC

## 2017-09-18 MED ORDER — DULOXETINE HCL 30 MG PO CPEP: ORAL_CAPSULE | ORAL | 0 refills | Status: DC

## 2017-09-18 NOTE — Telephone Encounter (Signed)
contacted patient.

## 2017-09-18 NOTE — Patient Instructions (Signed)
1. Continue duloxetine 90 mg daily (60 mg + 30 mg ) 2. Continue mirtazapine 30 mg at night  3. Continue Trazodone 50 mg at night as needed for sleep 4.  Return to clinic in one month for 30 mins

## 2017-09-21 ENCOUNTER — Ambulatory Visit (HOSPITAL_COMMUNITY): Payer: BLUE CROSS/BLUE SHIELD | Admitting: Psychiatry

## 2017-09-25 MED FILL — HYDROCODON-APAP 5-325: 5-325 | 3 days supply | Qty: 30 | Fill #0

## 2017-09-25 MED FILL — CEPHALEXIN 500 MG CAPSULE: 500 | 7 days supply | Qty: 28 | Fill #0

## 2017-09-28 ENCOUNTER — Encounter (HOSPITAL_COMMUNITY): Payer: Self-pay | Admitting: Psychiatry

## 2017-09-28 ENCOUNTER — Ambulatory Visit (INDEPENDENT_AMBULATORY_CARE_PROVIDER_SITE_OTHER): Payer: BLUE CROSS/BLUE SHIELD | Admitting: Psychiatry

## 2017-09-28 DIAGNOSIS — F331 Major depressive disorder, recurrent, moderate: Secondary | ICD-10-CM | POA: Diagnosis not present
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
## 2017-10-12 ENCOUNTER — Ambulatory Visit (HOSPITAL_COMMUNITY): Payer: BLUE CROSS/BLUE SHIELD | Admitting: Psychiatry

## 2017-10-12 ENCOUNTER — Encounter (HOSPITAL_COMMUNITY): Payer: Self-pay | Admitting: Psychiatry

## 2017-10-12 DIAGNOSIS — F331 Major depressive disorder, recurrent, moderate: Secondary | ICD-10-CM | POA: Diagnosis not present
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
## 2017-10-19 ENCOUNTER — Encounter (HOSPITAL_COMMUNITY): Payer: Self-pay | Admitting: Psychiatry

## 2017-10-19 ENCOUNTER — Ambulatory Visit (HOSPITAL_COMMUNITY): Payer: BLUE CROSS/BLUE SHIELD | Admitting: Psychiatry

## 2017-10-19 VITALS — BP 126/77 | HR 70 | Ht 67.5 in | Wt 155.0 lb

## 2017-10-19 DIAGNOSIS — I2510 Atherosclerotic heart disease of native coronary artery without angina pectoris: Secondary | ICD-10-CM

## 2017-10-19 DIAGNOSIS — M7060 Trochanteric bursitis, unspecified hip: Secondary | ICD-10-CM | POA: Diagnosis not present

## 2017-10-19 DIAGNOSIS — F331 Major depressive disorder, recurrent, moderate: Secondary | ICD-10-CM

## 2017-10-19 DIAGNOSIS — Z79899 Other long term (current) drug therapy: Secondary | ICD-10-CM | POA: Diagnosis not present

## 2017-10-19 DIAGNOSIS — Z818 Family history of other mental and behavioral disorders: Secondary | ICD-10-CM | POA: Diagnosis not present

## 2017-10-19 DIAGNOSIS — M797 Fibromyalgia: Secondary | ICD-10-CM | POA: Diagnosis not present

## 2017-10-19 DIAGNOSIS — K589 Irritable bowel syndrome without diarrhea: Secondary | ICD-10-CM

## 2017-10-19 MED ORDER — TRAZODONE HCL 50 MG PO TABS: 50.0000 mg | ORAL_TABLET | Freq: Every evening | ORAL | 0 refills | Status: DC | PRN

## 2017-10-19 MED ORDER — DULOXETINE HCL 60 MG PO CPEP: 60.0000 mg | ORAL_CAPSULE | Freq: Every day | ORAL | 0 refills | Status: DC

## 2017-10-19 MED ORDER — MIRTAZAPINE 30 MG PO TABS: 30.0000 mg | ORAL_TABLET | Freq: Every day | ORAL | 0 refills | Status: DC

## 2017-10-19 MED FILL — traZODone HCL 50 MG TABS: 50 | 30 days supply | Qty: 30 | Fill #0

## 2017-10-19 MED FILL — DULoxetine HCL 60 MG CPEP: 60 | 60 days supply | Qty: 60 | Fill #0

## 2017-10-19 NOTE — Patient Instructions (Signed)
1. Increase duloxetine 120 mg daily  2. Continue mirtazapine 30 mg at night  3. Continue Trazodone 50 mg at night as needed for sleep 4. Return to clinic in one month for 15 mins

## 2017-10-20 MED FILL — CELECOXIB 200 MG CAP: 200 | 30 days supply | Qty: 30 | Fill #0

## 2017-10-20 MED FILL — CARISOPRODOL 350 MG TABS: 350 | 20 days supply | Qty: 60 | Fill #0

## 2017-10-23 ENCOUNTER — Other Ambulatory Visit (HOSPITAL_COMMUNITY): Payer: Self-pay | Admitting: Internal Medicine

## 2017-10-23 DIAGNOSIS — M545 Low back pain: Secondary | ICD-10-CM

## 2017-10-27 ENCOUNTER — Ambulatory Visit (HOSPITAL_COMMUNITY): Payer: BLUE CROSS/BLUE SHIELD | Admitting: Psychiatry

## 2017-10-27 ENCOUNTER — Other Ambulatory Visit: Payer: Self-pay | Admitting: Internal Medicine

## 2017-10-27 ENCOUNTER — Encounter (HOSPITAL_COMMUNITY): Payer: Self-pay | Admitting: Psychiatry

## 2017-10-27 DIAGNOSIS — F331 Major depressive disorder, recurrent, moderate: Secondary | ICD-10-CM

## 2017-10-27 DIAGNOSIS — M51369 Other intervertebral disc degeneration, lumbar region without mention of lumbar back pain or lower extremity pain: Secondary | ICD-10-CM

## 2017-10-27 DIAGNOSIS — M5136 Other intervertebral disc degeneration, lumbar region: Secondary | ICD-10-CM

## 2017-10-27 DIAGNOSIS — M549 Dorsalgia, unspecified: Secondary | ICD-10-CM
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
## 2017-11-04 ENCOUNTER — Ambulatory Visit
Admission: RE | Admit: 2017-11-04 | Discharge: 2017-11-04 | Disposition: A | Discharge status: Home / Self Care | Payer: BLUE CROSS/BLUE SHIELD | Source: Ambulatory Visit | Attending: Internal Medicine | Admitting: Internal Medicine

## 2017-11-04 DIAGNOSIS — M5136 Other intervertebral disc degeneration, lumbar region: Secondary | ICD-10-CM

## 2017-11-04 DIAGNOSIS — M549 Dorsalgia, unspecified: Secondary | ICD-10-CM

## 2017-11-09 ENCOUNTER — Encounter (HOSPITAL_COMMUNITY): Payer: Self-pay | Admitting: Psychiatry

## 2017-11-09 ENCOUNTER — Ambulatory Visit (HOSPITAL_COMMUNITY): Payer: BLUE CROSS/BLUE SHIELD | Admitting: Psychiatry

## 2017-11-09 DIAGNOSIS — F331 Major depressive disorder, recurrent, moderate: Secondary | ICD-10-CM | POA: Diagnosis not present
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
## 2017-11-20 ENCOUNTER — Ambulatory Visit (HOSPITAL_COMMUNITY): Payer: BLUE CROSS/BLUE SHIELD | Admitting: Psychiatry

## 2017-11-20 ENCOUNTER — Encounter (HOSPITAL_COMMUNITY): Payer: Self-pay | Admitting: Psychiatry

## 2017-11-20 VITALS — BP 106/70 | HR 69 | Ht 67.5 in | Wt 155.0 lb

## 2017-11-20 DIAGNOSIS — Z62811 Personal history of psychological abuse in childhood: Secondary | ICD-10-CM

## 2017-11-20 DIAGNOSIS — M549 Dorsalgia, unspecified: Secondary | ICD-10-CM | POA: Diagnosis not present

## 2017-11-20 DIAGNOSIS — Z818 Family history of other mental and behavioral disorders: Secondary | ICD-10-CM | POA: Diagnosis not present

## 2017-11-20 DIAGNOSIS — Z91410 Personal history of adult physical and sexual abuse: Secondary | ICD-10-CM

## 2017-11-20 DIAGNOSIS — Z91411 Personal history of adult psychological abuse: Secondary | ICD-10-CM

## 2017-11-20 DIAGNOSIS — F331 Major depressive disorder, recurrent, moderate: Secondary | ICD-10-CM | POA: Diagnosis not present

## 2017-11-20 DIAGNOSIS — F419 Anxiety disorder, unspecified: Secondary | ICD-10-CM | POA: Diagnosis not present

## 2017-11-20 DIAGNOSIS — R450 Nervousness: Secondary | ICD-10-CM

## 2017-11-20 MED ORDER — MIRTAZAPINE 30 MG PO TABS: 30.0000 mg | ORAL_TABLET | Freq: Every day | ORAL | 0 refills | Status: DC

## 2017-11-20 MED ORDER — TRAZODONE HCL 50 MG PO TABS: 50.0000 mg | ORAL_TABLET | Freq: Every evening | ORAL | 0 refills | Status: DC | PRN

## 2017-11-20 MED ORDER — DULOXETINE HCL 60 MG PO CPEP: 120.0000 mg | ORAL_CAPSULE | Freq: Every day | ORAL | 0 refills | Status: DC

## 2017-11-20 MED FILL — traZODone HCL 50 MG TABS: 50 | 90 days supply | Qty: 90 | Fill #0

## 2017-11-20 MED FILL — DULoxetine HCL 60 MG CPEP: 60 | 60 days supply | Qty: 120 | Fill #0

## 2017-11-20 MED FILL — MIRTAZAPINE 30 MG TABLET: 30 | 90 days supply | Qty: 90 | Fill #0

## 2017-11-20 NOTE — Patient Instructions (Addendum)
1. Continue duloxetine 120 mg daily  2. Continue mirtazapine 30 mg at night  3. Continue Trazodone 50 mg at night as needed for sleep 4. Return to clinic in six week for 15 mins

## 2017-11-21 ENCOUNTER — Other Ambulatory Visit: Payer: Self-pay | Admitting: Obstetrics and Gynecology

## 2017-11-21 DIAGNOSIS — E2839 Other primary ovarian failure: Secondary | ICD-10-CM

## 2017-11-23 ENCOUNTER — Ambulatory Visit (HOSPITAL_COMMUNITY): Payer: Self-pay | Admitting: Psychiatry

## 2017-11-24 ENCOUNTER — Ambulatory Visit
Admission: RE | Admit: 2017-11-24 | Discharge: 2017-11-24 | Disposition: A | Discharge status: Home / Self Care | Payer: BLUE CROSS/BLUE SHIELD | Source: Ambulatory Visit | Attending: Obstetrics and Gynecology | Admitting: Obstetrics and Gynecology

## 2017-11-24 DIAGNOSIS — E2839 Other primary ovarian failure: Secondary | ICD-10-CM

## 2017-12-05 ENCOUNTER — Other Ambulatory Visit: Payer: Self-pay

## 2017-12-05 DIAGNOSIS — I83893 Varicose veins of bilateral lower extremities with other complications: Secondary | ICD-10-CM

## 2017-12-06 MED FILL — GABAPENTIN 100 MG CAP: 100 | 30 days supply | Qty: 90 | Fill #2

## 2017-12-06 MED FILL — CELECOXIB 200 MG CAP: 200 | 30 days supply | Qty: 30 | Fill #1

## 2017-12-07 ENCOUNTER — Ambulatory Visit (HOSPITAL_COMMUNITY): Payer: Self-pay | Admitting: Psychiatry

## 2017-12-14 ENCOUNTER — Encounter: Payer: Self-pay | Admitting: Adult Health

## 2017-12-14 ENCOUNTER — Ambulatory Visit: Payer: BLUE CROSS/BLUE SHIELD | Admitting: Adult Health

## 2017-12-14 VITALS — BP 116/67 | HR 77 | Ht 67.5 in | Wt 157.2 lb

## 2017-12-14 DIAGNOSIS — G43019 Migraine without aura, intractable, without status migrainosus: Secondary | ICD-10-CM | POA: Diagnosis not present

## 2017-12-14 DIAGNOSIS — R251 Tremor, unspecified: Secondary | ICD-10-CM

## 2017-12-14 MED ORDER — GABAPENTIN 100 MG PO CAPS: 200.0000 mg | ORAL_CAPSULE | Freq: Three times a day (TID) | ORAL | 5 refills | Status: DC

## 2017-12-14 NOTE — Progress Notes (Signed)
I have read the note, and I agree with the clinical assessment and plan.  Stacey Lawrence   

## 2017-12-14 NOTE — Progress Notes (Signed)
PATIENT: Stacey Lawrence DOB: April 17, 1951  REASON FOR VISIT: follow up HISTORY FROM: patient  HISTORY OF PRESENT ILLNESS: Today 12/14/17 [name] is a 67 year old female with a history of essential tremor.  She returns today for follow-up.  She states that the tremor is a little better with gabapentin.  She reports that she has trouble crocheting.  Reports that she has some trouble eating and holding a fork or spoon.  She reports that she likes to sew and she has a hard time holding thread. She reports that her migraines have also improved.  She has approximately 3 migraines a month.  She states they typically occur on the top of the head and on the right or left side.  She does have photophobia, phonophobia, nausea and vomiting.  She states that her sister died last December 11, 2022 of Stacey Lawrence and she had a brother that recently passed in January from Stacey  Lawrence.  She returns today for evaluation.  HISTORY 06/16/17: [name] is a 67 year old right-handed white female with history of an essential tremor. The patient indicates that she is having difficulty performing tasks that require fine motor control. She likes to paint and had to give up painting detailed projects. She also has had to give up sewing in part. The patient has tremors on both arms. She denies any vocal tremor or head or neck tremor. She also has had an increase in her migraine headaches that are now on average 2 times a week, oftentimes coming on at night and may respond to caffeinated products. The patient has been on Cymbalta, the dose was recently increased one week ago. The patient claims that her maternal grandmother also had a similar essential tremor. The patient comes the office today for an evaluation.   REVIEW OF SYSTEMS: Out of a complete 14 system review of symptoms, the patient complains only of the following symptoms, and all other reviewed systems are negative.  Change, hearing loss, ear pain, light,  chest pain, codeine, heat and flushing, cough, nausea, restless leg, frequent waking joint pain, joint swelling, back pain, aching muscles, walking difficulty, agitation, confusion, depression, nervous/anxious, memory loss, dizziness, headache, numbness, speech difficulty, tremors  ALLERGIES: Allergies  Allergen Reactions  . Dilaudid [Hydromorphone Hcl] Other (See Comments)    Resp. arrest  . Codeine Nausea And Vomiting and Rash  . Morphine And Related Nausea And Vomiting    HOME MEDICATIONS: Outpatient Medications Prior to Visit  Medication Sig Dispense Refill  . acetaminophen (TYLENOL) 325 MG tablet Take 650 mg by mouth every 6 (six) hours as needed for mild pain.    . Ascorbic Acid (VITAMIN C PO) Take 2 tablets by mouth daily.    Marland Kitchen aspirin 81 MG chewable tablet aspirin 81 mg chewable tablet  Chew 1 tablet every day by oral route.    Marland Kitchen atorvastatin (LIPITOR) 40 MG tablet atorvastatin 40 mg tablet    . Biotin (BIOTIN 5000) 5 MG CAPS Take 1 capsule by mouth daily.    . Calcium Citrate-Vitamin D (CALCITRATE/VITAMIN D PO) Take 1 tablet by mouth daily.    . carisoprodol (SOMA) 350 MG tablet Take 350 mg by mouth as needed.     . celecoxib (CELEBREX) 200 MG capsule celecoxib 200 mg capsule    . docusate sodium (COLACE) 100 MG capsule Take 100 mg by mouth daily as needed for mild constipation.    . DULoxetine (CYMBALTA) 60 MG capsule Take 2 capsules (120 mg total) by mouth daily. [place]  capsule 0  . gabapentin (NEURONTIN) 100 MG capsule Take 1 capsule (100 mg total) by mouth 3 (three) times daily. 90 capsule 3  . indomethacin (INDOCIN) 25 MG capsule indomethacin 25 mg capsule prn    . meloxicam (MOBIC) 15 MG tablet meloxicam 15 mg tablet prn    . metoprolol succinate (TOPROL-XL) 25 MG 24 hr tablet metoprolol succinate ER 25 mg tablet,extended release 24 hr prn    . mirtazapine (REMERON) 30 MG tablet Take 1 tablet (30 mg total) by mouth at bedtime. 90 tablet 0  . nitroGLYCERIN (NITROSTAT) 0.4 MG  SL tablet PLACE 1 TABLET UNDER THE TONGUE EVERY 5 MINUTES AS NEEDED FOR CHEST PAIN 100 tablet 1  . promethazine (PHENERGAN) 25 MG tablet Take 25 mg by mouth every 6 (six) hours as needed for nausea or vomiting.    . traZODone (DESYREL) 50 MG tablet Take 1 tablet (50 mg total) by mouth at bedtime as needed for sleep. 90 tablet 0  . nitroGLYCERIN (NITROSTAT) 0.4 MG SL tablet nitroglycerin 0.4 mg sublingual tablet    . dexlansoprazole (DEXILANT) 60 MG capsule Take 1 capsule (60 mg total) by mouth daily. (Patient not taking: Reported on 12/14/2017) 30 capsule 11  . pantoprazole (PROTONIX) 40 MG tablet TAKE 1 TABLET BY MOUTH ONCE DAILY (Patient not taking: Reported on 12/14/2017) 30 tablet 5  . Vitamin D, Ergocalciferol, (DRISDOL) 50000 units CAPS capsule Take 50,000 Units by mouth every 7 (seven) days.      No facility-administered medications prior to visit.     PAST MEDICAL HISTORY: Past Medical History:  Diagnosis Date  . Anemia    hx  . Anxiety   . CAD (coronary artery Lawrence)    a. 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 05/2013 Lexi CL: EF 77%, old small inferolat scar, no ischemia.  . Common migraine with intractable migraine 06/16/2017  . Depression   . Fibromyalgia   . GERD (gastroesophageal reflux Lawrence)    hasn't started taking any meds  . History of blood transfusion    103yrs ago  . Hx of colonic polyps   . IBS (irritable bowel syndrome)    constipation  . Joint pain   . Joint swelling   . Kidney stones 03/2010  . Migraine    "q other week" (02/04/2016)  . Myocardial infarction ([place])   . Numbness    right leg  . Polycythemia   . PONV (postoperative nausea and vomiting)   . Raynaud's Lawrence   . Scoliosis   . Shortness of breath  dyspnea    with exertion    PAST SURGICAL HISTORY: Past Surgical History:  Procedure Laterality Date  . ABDOMINAL HYSTERECTOMY     partial  . APPENDECTOMY     with explor. lap.  Marland Kitchen CARDIAC CATHETERIZATION    . CHOLECYSTECTOMY  10/28/2002   lap.  . COLONOSCOPY    . DEBRIDEMENT TENNIS ELBOW    . DIAGNOSTIC LAPAROSCOPY    . DILATION AND CURETTAGE OF UTERUS    . ELBOW SURGERY     golfer's elbow-bilateral  . ESOPHAGOGASTRODUODENOSCOPY  09/10/2012   Procedure: ESOPHAGOGASTRODUODENOSCOPY (EGD);  Surgeon: Rogene Houston, MD;  Location: [facility];  Service: Endoscopy;  Laterality: N/A;  730  . EXCISION/RELEASE BURSA HIP Left 02/04/2016   Procedure: LEFT HIP TROCHANTERIC BURSECTOMY;  Surgeon: Mcarthur Rossetti, MD;  Location: [place];  Service: Orthopedics;  Laterality: Left;  . I&D EXTREMITY  02/25/2012   Procedure: IRRIGATION AND DEBRIDEMENT EXTREMITY;  Surgeon: Roseanne Kaufman, MD;  Location: [place];  Service: Orthopedics;  Laterality: Right;  Irrigation and Debridement and Repair Right Thumb Nerve Laceration and Assoiated Structures   . KNEE DEBRIDEMENT     right  . LAPAROTOMY    . LEFT HEART CATHETERIZATION WITH CORONARY ANGIOGRAM N/A 01/16/2013   Procedure: LEFT HEART CATHETERIZATION WITH CORONARY ANGIOGRAM;  Surgeon: Jolaine Artist, MD;  Location: [facility];  Service: Cardiovascular;  Laterality: N/A;  . STAPEDECTOMY     right  . TONSILLECTOMY AND ADENOIDECTOMY    . TRIGGER FINGER RELEASE  08/19/2011   Procedure: RELEASE TRIGGER FINGER/A-1 PULLEY;  Surgeon: Willa Frater III;  Location: [place];  Service: Orthopedics;  Laterality: Right;  right middle finger a-1 pulley release with tenosynovectomy  . TROCHANTERIC BURSA EXCISION Right 02/04/2016  . TUBAL LIGATION    . TYMPANOPLASTY      FAMILY HISTORY: Family History  Problem Relation Age of Onset  . Lawrence Mother        Deceased with Stacey CA  . Heart failure Mother   . Heart Lawrence Mother   .  COPD Mother   . Varicose Veins Mother   . Depression Mother   . Lawrence Father        Deceased with Stacey CA  . Heart Lawrence Father   . Varicose Veins Father   . Depression Father   . Depression Brother   . ADD / ADHD Son     SOCIAL HISTORY: Social History   Socioeconomic History  . Marital status: Married    Spouse name: Not on file  . Number of children: 2  . Years of education: 38  . Highest education level: Not on file  Occupational History  . Not on file  Social Needs  . Financial resource strain: Not on file  . Food insecurity:    Worry: Not on file    Inability: Not on file  . Transportation needs:    Medical: Not on file    Non-medical: Not on file  Tobacco Use  . Smoking status: Never Smoker  . Smokeless tobacco: Never Used  Substance and Sexual Activity  . Alcohol use: No    Alcohol/week: 0.0 oz  . Drug use: No  . Sexual activity: Not Currently    Birth control/protection: Surgical  Lifestyle  . Physical activity:    Days per week: Not on file    Minutes per session: Not on file  . Stress: Not on file  Relationships  . Social connections:    Talks on phone: Not on file    Gets together: Not on file    Attends religious service: Not on file    Active member of club or organization: Not on file    Attends meetings of clubs or organizations: Not on file    Relationship status: Not on file  . Intimate partner violence:    Fear of current or ex partner: Not on file    Emotionally abused: Not on file    Physically abused: Not on file    Forced sexual activity: Not on file  Other Topics Concern  . Not on file  Social History Narrative   Lives  in [place] with husband.     Grown children.     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   Caffeine use: none      PHYSICAL EXAM  Vitals:   12/14/17 1112  BP: 116/67  Pulse: 77  Weight: 157 lb 3.2 oz (71.3 kg)  Height: 5' 7.5" (1.715 m)   Body mass index is 24.26  kg/m.  Generalized: Well developed, in no acute distress   Neurological examination  Mentation: Alert oriented to time, place, history taking. Follows all commands speech and language fluent Cranial nerve II-XII: Pupils were equal round reactive to light. Extraocular movements were full, visual field were full on confrontational test. Facial sensation and strength were normal. Uvula tongue midline. Head turning and shoulder shrug  were normal and symmetric. Motor: The motor testing reveals 5 over 5 strength of all 4 extremities. Good symmetric motor tone is noted throughout.  Sensory: Sensory testing is intact to soft touch on all 4 extremities. No evidence of extinction is noted.  Coordination: Cerebellar testing reveals good finger-nose-finger and heel-to-shin bilaterally.  Gait and station: Gait is normal. Tandem gait is normal. Romberg is negative. No drift is seen.  Reflexes: Deep tendon reflexes are symmetric and normal bilaterally.   DIAGNOSTIC DATA (LABS, IMAGING, TESTING) - I reviewed patient records, labs, notes, testing and imaging myself where available.  Lab Results  Component Value Date   WBC 5.5 02/01/2016   HGB 14.9 02/01/2016   HCT 45.0 02/01/2016   MCV 89.8 02/01/2016   PLT 200 02/01/2016      Component Value Date/Time   NA 142 02/01/2016 1053   K 4.4 02/01/2016 1053   CL 104 02/01/2016 1053   CO2 29 02/01/2016 1053   GLUCOSE 113 (H) 02/01/2016 1053   BUN 10 02/01/2016 1053   CREATININE 0.86 02/01/2016 1053   CREATININE 0.79 07/16/2014 1707   CALCIUM 10.0 02/01/2016 1053   PROT 6.2 (L) 06/17/2015 1408   ALBUMIN 3.8 06/17/2015 1408   AST 21 06/17/2015 1408   ALT 16 06/17/2015 1408   ALKPHOS 76 06/17/2015 1408   BILITOT 1.1 06/17/2015 1408   GFRNONAA >60 02/01/2016 1053   GFRAA >60 02/01/2016 1053   Lab Results  Component Value Date   CHOL 140 10/20/2014   HDL 73 10/20/2014   LDLCALC 52 10/20/2014   TRIG 76 10/20/2014   CHOLHDL 1.9 10/20/2014   Lab  Results  Component Value Date   HGBA1C 5.7 (H) 10/20/2014    Lab Results  Component Value Date   TSH 3.061 10/20/2014      ASSESSMENT AND PLAN 67 y.o. year old female  has a past medical history of Anemia, Anxiety, CAD (coronary artery Lawrence), Common migraine with intractable migraine (06/16/2017), Depression, Fibromyalgia, GERD (gastroesophageal reflux Lawrence), History of blood transfusion, colonic polyps, IBS (irritable bowel syndrome), Joint pain, Joint swelling, Kidney stones (03/2010), Migraine, Myocardial infarction ([place]), Numbness, Polycythemia, PONV (postoperative nausea and vomiting), Raynaud's Lawrence, Scoliosis, and Shortness of breath dyspnea. here with:  1.  Essential tremor 2.  Migraine headache  The patient will try increasing gabapentin to 200 mg 3 times a day.  We will see if this offers her any additional benefit for her tremor and headaches.  She is advised that if she is unable to tolerate the increase she should let us know.  We will follow-up in 6 months or sooner if needed.      Ward Givens, MSN, NP-C 12/14/2017, 11:36  AM [facility] [address] [place], [place] [address] [phone]

## 2017-12-14 NOTE — Patient Instructions (Signed)
Your Plan:  Increase Gabapentin 200 mg three times a day If your symptoms worsen or you develop new symptoms please let us know.   Thank you for coming to see [place] at [facility]. I hope we have been able to provide you high quality care today.  You may receive a patient satisfaction survey over the next few weeks. We would appreciate your feedback and comments so that we may continue to improve ourselves and the health of our patients.

## 2017-12-19 DIAGNOSIS — Z7183 Encounter for nonprocreative genetic counseling: Secondary | ICD-10-CM | POA: Diagnosis not present

## 2017-12-19 DIAGNOSIS — Z1371 Encounter for nonprocreative screening for genetic disease carrier status: Secondary | ICD-10-CM | POA: Diagnosis not present

## 2017-12-20 ENCOUNTER — Encounter (HOSPITAL_COMMUNITY): Payer: Self-pay | Admitting: Psychiatry

## 2017-12-20 ENCOUNTER — Ambulatory Visit (INDEPENDENT_AMBULATORY_CARE_PROVIDER_SITE_OTHER): Payer: Medicare Other | Admitting: Psychiatry

## 2017-12-20 DIAGNOSIS — F331 Major depressive disorder, recurrent, moderate: Secondary | ICD-10-CM

## 2017-12-20 MED FILL — GABAPENTIN 100 MG CAP: 100 | 30 days supply | Qty: 180 | Fill #0
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
## 2017-12-21 ENCOUNTER — Ambulatory Visit (HOSPITAL_COMMUNITY): Payer: Medicare Other | Attending: Physician Assistant

## 2017-12-21 DIAGNOSIS — G8929 Other chronic pain: Secondary | ICD-10-CM | POA: Insufficient documentation

## 2017-12-21 DIAGNOSIS — M545 Low back pain, unspecified: Secondary | ICD-10-CM

## 2017-12-21 DIAGNOSIS — M6281 Muscle weakness (generalized): Secondary | ICD-10-CM | POA: Insufficient documentation

## 2017-12-21 NOTE — Therapy (Signed)
[facility] [place], [name], [address] Phone: [phone]   Fax:  [phone]  Physical Therapy Evaluation  Patient Details  Name: CECILIE HEIDEL MRN: [id number] Date of Birth: Nov 30, 1950 Referring Provider: Ronette Deter    Encounter Date: 12/21/2017  PT End of Session - 12/21/17 1433    Visit Number  1    Number of Visits  16    Date for PT Re-Evaluation  02/20/18    Authorization Type  Medicare    Authorization Time Period  cert 02/25/61-05/25/27; reassessment on 01/20/18, ReEVAL on 02/20/18     PT Start Time  1300    PT Stop Time  1350    PT Time Calculation (min)  50 min    Activity Tolerance  Patient tolerated treatment well;No increased pain    Behavior During Therapy  WFL for tasks assessed/performed       Past Medical History:  Diagnosis Date  . Anemia    hx  . Anxiety   . CAD (coronary artery disease)    a. 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 05/2013 Lexi CL: EF 77%, old small inferolat scar, no ischemia.  . Common migraine with intractable migraine 06/16/2017  . Depression   . Fibromyalgia   . GERD (gastroesophageal reflux disease)    hasn't started taking any meds  . History of blood transfusion    30yrs ago  . Hx of colonic polyps   . IBS (irritable bowel syndrome)    constipation  . Joint pain   . Joint swelling   . Kidney stones 03/2010  . Migraine    "q other week" (02/04/2016)  . Myocardial infarction ([facility])   . Numbness    right leg  . Polycythemia   . PONV (postoperative nausea and vomiting)   . Raynaud's disease   . Scoliosis   . Shortness of breath dyspnea    with exertion    Past Surgical History:  Procedure Laterality Date  . ABDOMINAL  HYSTERECTOMY     partial  . APPENDECTOMY     with explor. lap.  Marland Kitchen CARDIAC CATHETERIZATION    . CHOLECYSTECTOMY  10/28/2002   lap.  . COLONOSCOPY    . DEBRIDEMENT TENNIS ELBOW    . DIAGNOSTIC LAPAROSCOPY    . DILATION AND CURETTAGE OF UTERUS    . ELBOW SURGERY     golfer's elbow-bilateral  . ESOPHAGOGASTRODUODENOSCOPY  09/10/2012   Procedure: ESOPHAGOGASTRODUODENOSCOPY (EGD);  Surgeon: Rogene Houston, MD;  [facility];  Service: Endoscopy;  Laterality: N/A;  730  . EXCISION/RELEASE BURSA HIP Left 02/04/2016   Procedure: LEFT HIP TROCHANTERIC BURSECTOMY;  Surgeon: Mcarthur Rossetti, MD;  Location: [facility];  Service: Orthopedics;  Laterality: Left;  . I&D EXTREMITY  02/25/2012   Procedure: IRRIGATION AND DEBRIDEMENT EXTREMITY;  Surgeon: Roseanne Kaufman, MD;  Location: [facility];  Service: Orthopedics;  Laterality: Right;  Irrigation and Debridement and Repair Right Thumb Nerve Laceration and Assoiated Structures   . KNEE DEBRIDEMENT     right  . LAPAROTOMY    . LEFT HEART CATHETERIZATION WITH CORONARY ANGIOGRAM N/A 01/16/2013   Procedure: LEFT HEART CATHETERIZATION WITH CORONARY ANGIOGRAM;  Surgeon: Quillian Quince  R Bensimhon, MD;  Location: [facility];  Service: Cardiovascular;  Laterality: N/A;  . STAPEDECTOMY     right  . TONSILLECTOMY AND ADENOIDECTOMY    . TRIGGER FINGER RELEASE  08/19/2011   Procedure: RELEASE TRIGGER FINGER/A-1 PULLEY;  Surgeon: Willa Frater III;  Location: [facility];  Service: Orthopedics;  Laterality: Right;  right middle finger a-1 pulley release with tenosynovectomy  . TROCHANTERIC BURSA EXCISION Right 02/04/2016  . TUBAL LIGATION    . TYMPANOPLASTY      There were no vitals filed for this visit.   Subjective Assessment - 12/21/17 1310    Subjective  Pt reports long history of back pain, seevral years, attributes to several decades of working in the OR, lots of standing, use of heavy tools in awkward positions, and moving of heavy  patients, worse since 2016. She says she has to rely on shopping carts d/t Rt leg weakness. She says RLE referral is intermittent. She has a history of bilat trochanteric bursitis, s/p bursectomy on left without significant improvement.     Pertinent History  Worked in [place] for several decades, retired 2016.     Limitations  Sitting;Standing;Walking    How long can you sit comfortably?  about 30 minutes     How long can you stand comfortably?  about 45 minutes     How long can you walk comfortably?  about 30 minutes (shopping)     Diagnostic tests  MRI     Patient Stated Goals  tolerate sitting in car longer, tolerate work in garden/flowerbed; prevent need for spine surgery, preven tage-related osteoporosis-like hyper kyphosis     Currently in Pain?  Yes    Pain Score  7     Pain Location  Back    Pain Orientation  -- bilat C3/C4    Pain Descriptors / Indicators  -- a bit of all of it "pain-pain"    Pain Type  Chronic pain    Pain Onset  Other (comment) years    Pain Frequency  Constant    Aggravating Factors   always hurts, prolonged sitting/standing, IADL         [facility] PT Assessment - 12/21/17 0001      Assessment   Medical Diagnosis  acute on chronic low back pain s/p fall 6MA    Referring Provider  [facility]     Onset Date/Surgical Date  -- This episode October 2018     Hand Dominance  Right    Next MD Visit  -- sees Melina Schools next week      Precautions   Precautions  None      Observation/Other Assessments   Focus on Therapeutic Outcomes (FOTO)   FOTO: 47      Sensation   Light Touch  Impaired Detail mild decrease at the Rt lateral foreleg, and Rt post thigh     Additional Comments  Hx of venous regurgitation      Functional Tests   Functional tests  Sit to Stand;Single leg stance      Single Leg Stance   Comments  Rt: 8.97sec; Lt: 1.78sec      Sit to Stand   Comments  5xSTS: 18.74sec, arms at chest      ROM / Strength   AROM / PROM / Strength  AROM;Strength       AROM   AROM Assessment Site  Lumbar    Lumbar Flexion  Seated Floor Touch:  moderately limited flexion;  no (+) pain    Lumbar Extension  upper lumbar, lower thoracic lacks extension no (+) pain    Lumbar - Right Rotation  59 degrees    Lumbar - Left Rotation  29 degrees      Strength   Strength Assessment Site  Hip;Knee;Ankle    Right/Left Hip  Right;Left    Right Hip Flexion  4-/5 gives way    Right Hip Extension  -- Supine Bridge lacks full range, near maximal effort req    Right Hip External Rotation   3+/5    Right Hip Internal Rotation  4-/5    Right Hip ABduction  3+/5 horizontal abdct: 4/5    Right Hip ADduction  3+/5    Left Hip Flexion  4/5    Left Hip Extension  -- Supine Bridge lacks full range, near maximal effort req    Left Hip External Rotation  4+/5    Left Hip Internal Rotation  4+/5    Left Hip ABduction  3+/5 horizontal abdct: 4/5    Left Hip ADduction  5/5    Right/Left Knee  Right;Left    Right Knee Flexion  3+/5    Right Knee Extension  4-/5    Left Knee Flexion  5/5    Left Knee Extension  5/5    Right/Left Ankle  Right;Left    Right Ankle Dorsiflexion  4-/5    Right Ankle Plantar Flexion  3+/5 seated soleus    Left Ankle Dorsiflexion  5/5    Left Ankle Plantar Flexion  5/5      Special Tests    Special Tests  Lumbar    Lumbar Tests  other;other2      other   Comments  Segmental Spring Testing Central/Uni A-P:       other   Comment  Repeated movemetn screening for directional preference:       Ambulation/Gait   Gait velocity  1.33m/s 10MWT                Objective measurements completed on examination: See above findings.                PT Short Term Goals - 12/21/17 1625      PT SHORT TERM GOAL #1   Title  MMT In RLE to increase by 1/2 MMT grading.     Time  4    Period  Weeks    Status  New    Target Date  01/20/18      PT SHORT TERM GOAL #2   Title  SLS to improve to >10 sec bilat.     Time  4     Period  Weeks    Status  New    Target Date  01/20/18      PT SHORT TERM GOAL #3   Title  Pt will toelrate 6MWT @>0.25m/s.     Time  4    Period  Weeks    Status  New    Target Date  01/20/18        PT Long Term Goals - 12/21/17 1627      PT LONG TERM GOAL #1   Title   RLE MMT to improve 1 grade.     Time  8    Period  Weeks    Status  New    Target Date  02/20/18      PT LONG TERM GOAL #2   Title  SLS balance > 30sec  bilat     Time  8    Period  Weeks    Status  New    Target Date  02/20/18      PT LONG TERM GOAL #3   Title  Regularly tolerating AMB >15 minutes 2-3x/week.     Time  8    Period  Weeks    Status  New    Target Date  02/21/18             Plan - 12/21/17 1613    Clinical Impression Statement  Pt presenting with CC of chronic low back pain, unremitting. Imaging revealing of some segmental, age-related changes in the lower lumbar region with narrowing of forminal space. PT notes that totality of lordisis exists between L4-S1 region with no curvature from T10-L4. Examination revealing of RLE weakness, quesitonable for neurological componenet. Light touch sensation is inconclusive, 2 segments with decreased sensation, but complicated by vascular disease. Trunk rotation ROM is limited significantly to the left and moderately to the right. AMB also void of trunk movement, noted for rigidity and armswing from GHJ only bilat. Pt has a complex surgical history and maintains each diagnosis as self identifier without clear distinction as to whether she has improved after them, but relating that there was something wrong prior to surgery and so there is still something wrong. This may make it difficult to improve her self efficacy in pain management going forward and additional education will be a big part of her POC. Also noted history of bilat hip strength insufficiency, which without a doubt has played a role. Pt will benefit from stretegies for pain maangement,  imporvement of lumbar spinal curves, BLE strengthening, postural strengthening and endurance training.     History and Personal Factors relevant to plan of care:  History of MIx5, bilat hip bursitis.     Clinical Presentation  Unstable    Clinical Presentation due to:  tests adn measures     Clinical Decision Making  High    Rehab Potential  Fair    PT Frequency  2x / week    PT Duration  8 weeks    PT Treatment/Interventions  Moist Heat;Electrical Stimulation;Therapeutic activities;Therapeutic exercise;Balance training;Neuromuscular re-education;Gait training;Stair training;Functional mobility training;Dry needling;Passive range of motion;Manual techniques    PT Next Visit Plan  Establish HEP for hip ABDCT/EXT strengthening, gentle Lumbar spine mobility program (would benefit from thoracolumbar extension, without additional lumbosacral extension, discuss goals, have fun!     Consulted and Agree with Plan of Care  Patient       Patient will benefit from skilled therapeutic intervention in order to improve the following deficits and impairments:  Hypomobility, Impaired sensation, Abnormal gait, Decreased activity tolerance, Decreased balance, Decreased range of motion, Decreased mobility, Decreased strength, Improper body mechanics, Postural dysfunction, Pain  Visit Diagnosis: Chronic midline low back pain without sciatica - Plan: PT plan of care cert/re-cert  Muscle weakness (generalized) - Plan: PT plan of care cert/re-cert     Problem List Patient Active Problem List   Diagnosis Date Noted  . Common migraine with intractable migraine 06/16/2017  . Tremor 11/16/2016  . Moderate episode of recurrent major depressive disorder ([facility]) 07/26/2016  . Trochanteric bursitis of left hip 02/04/2016  . Trochanteric bursitis of both hips 10/31/2015  . Chest pain at rest 06/17/2015  . Inferior myocardial infarction ([facility]) 06/17/2015  . Sprain of interphalangeal joint of left little finger  02/05/2014  . Pain in joint, hand 02/05/2014  . Midsternal chest  pain 06/04/2013  . CAD (coronary artery disease)   . Fibromyalgia   . GERD (gastroesophageal reflux disease)   . Migraine headache   . Acute non-STEMI < 8 weeks prior, subsequent admission after initial 01/16/2013  . Radial nerve laceration 03/27/2012  . Lack of coordination 03/27/2012  . Muscle weakness (generalized) 03/27/2012   4:34 PM, 12/21/17 Etta Grandchild, PT, DPT Physical Therapist at [facility] [phone] (office)      Etta Grandchild 12/21/2017, 4:34 PM  [facility] [address] [place], [name], [address] Phone: [phone]   Fax:  [phone]  Name: CACHE DECOURSEY MRN: [id number] Date of Birth: [birth year]

## 2017-12-22 ENCOUNTER — Ambulatory Visit (HOSPITAL_COMMUNITY): Payer: Medicare Other

## 2017-12-22 ENCOUNTER — Encounter (HOSPITAL_COMMUNITY): Payer: Self-pay

## 2017-12-22 DIAGNOSIS — M545 Low back pain, unspecified: Secondary | ICD-10-CM

## 2017-12-22 DIAGNOSIS — G8929 Other chronic pain: Secondary | ICD-10-CM | POA: Diagnosis not present

## 2017-12-22 DIAGNOSIS — M6281 Muscle weakness (generalized): Secondary | ICD-10-CM

## 2017-12-22 NOTE — Therapy (Signed)
[facility] [place], [name], [address] Phone: [phone]   Fax:  [phone]  Physical Therapy Treatment  Patient Details  Name: Stacey Lawrence MRN: [id number] Date of Birth: [birth year] Referring Provider: Ronette Deter    Encounter Date: 12/22/2017  PT End of Session - 12/22/17 1520    Visit Number  2    Number of Visits  16    Date for PT Re-Evaluation  02/20/18    Authorization Type  Medicare    Authorization Time Period  cert 05/25/21-10/28/77; reassessment on 01/20/18, ReEVAL on 02/20/18     PT Start Time  1520    Activity Tolerance  Patient tolerated treatment well;No increased pain    Behavior During Therapy  WFL for tasks assessed/performed       Past Medical History:  Diagnosis Date  . Anemia    hx  . Anxiety   . CAD (coronary artery disease)    a. 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 05/2013 Lexi CL: EF 77%, old small inferolat scar, no ischemia.  . Common migraine with intractable migraine 06/16/2017  . Depression   . Fibromyalgia   . GERD (gastroesophageal reflux disease)    hasn't started taking any meds  . History of blood transfusion    63yrs ago  . Hx of colonic polyps   . IBS (irritable bowel syndrome)    constipation  . Joint pain   . Joint swelling   . Kidney stones 03/2010  . Migraine    "q other week" (02/04/2016)  . Myocardial infarction ([facility])   . Numbness    right leg  . Polycythemia   . PONV (postoperative nausea and vomiting)   . Raynaud's disease   . Scoliosis   . Shortness of breath dyspnea    with exertion    Past Surgical History:  Procedure Laterality Date  . ABDOMINAL HYSTERECTOMY     partial  . APPENDECTOMY     with explor. lap.   Marland Kitchen CARDIAC CATHETERIZATION    . CHOLECYSTECTOMY  10/28/2002   lap.  . COLONOSCOPY    . DEBRIDEMENT TENNIS ELBOW    . DIAGNOSTIC LAPAROSCOPY    . DILATION AND CURETTAGE OF UTERUS    . ELBOW SURGERY     golfer's elbow-bilateral  . ESOPHAGOGASTRODUODENOSCOPY  09/10/2012   Procedure: ESOPHAGOGASTRODUODENOSCOPY (EGD);  Surgeon: Rogene Houston, MD;  [facility];  Service: Endoscopy;  Laterality: N/A;  730  . EXCISION/RELEASE BURSA HIP Left 02/04/2016   Procedure: LEFT HIP TROCHANTERIC BURSECTOMY;  Surgeon: Mcarthur Rossetti, MD;  Location: [facility];  Service: Orthopedics;  Laterality: Left;  . I&D EXTREMITY  02/25/2012   Procedure: IRRIGATION AND DEBRIDEMENT EXTREMITY;  Surgeon: Roseanne Kaufman, MD;  Location: [facility];  Service: Orthopedics;  Laterality: Right;  Irrigation and Debridement and Repair Right Thumb Nerve Laceration and Assoiated Structures   . KNEE DEBRIDEMENT     right  . LAPAROTOMY    . LEFT HEART CATHETERIZATION WITH CORONARY ANGIOGRAM N/A 01/16/2013   Procedure: LEFT HEART CATHETERIZATION WITH CORONARY ANGIOGRAM;  Surgeon: Jolaine Artist, MD;  Location: [facility];  Service: Cardiovascular;  Laterality: N/A;  . STAPEDECTOMY  right  . TONSILLECTOMY AND ADENOIDECTOMY    . TRIGGER FINGER RELEASE  08/19/2011   Procedure: RELEASE TRIGGER FINGER/A-1 PULLEY;  Surgeon: Willa Frater III;  Location: [facility];  Service: Orthopedics;  Laterality: Right;  right middle finger a-1 pulley release with tenosynovectomy  . TROCHANTERIC BURSA EXCISION Right 02/04/2016  . TUBAL LIGATION    . TYMPANOPLASTY      There were no vitals filed for this visit.  Subjective Assessment - 12/22/17 1520    Subjective  Pt states that her sinuses are acting up with this weather and pollen. Her back isn't feeling too great right now.     Pertinent History  Worked in [place] for several decades, retired 2016.     Limitations  Sitting;Standing;Walking    How long can you sit  comfortably?  about 30 minutes     How long can you stand comfortably?  about 45 minutes     How long can you walk comfortably?  about 30 minutes (shopping)     Diagnostic tests  MRI     Patient Stated Goals  tolerate sitting in car longer, tolerate work in garden/flowerbed; prevent need for spine surgery, preven tage-related osteoporosis-like hyper kyphosis     Currently in Pain?  Yes    Pain Score  7     Pain Location  Back    Pain Orientation  Lower    Pain Descriptors / Indicators  Dull;Aching    Pain Type  Chronic pain    Pain Onset  Other (comment) years    Pain Frequency  Constant    Aggravating Factors   always hurts, prolonged sitting/standing, IADL    Pain Relieving Factors  heating pad    Effect of Pain on Daily Activities  limit activities           [facility] Adult PT Treatment/Exercise - 12/22/17 0001      Exercises   Exercises  Lumbar      Lumbar Exercises: Stretches   Other Lumbar Stretch Exercise  seated lumbar flexion stretch with medium physioball 10x10" holds      Lumbar Exercises: Seated   Sit to Stand  10 reps    Sit to Stand Limitations  RTB around knees preventing knee valgus    Other Seated Lumbar Exercises  lower thoracic extension over towel roll x10 at multiple levels    Other Seated Lumbar Exercises  throacic rotation in chair x10 reps each      Lumbar Exercises: Supine   Bridge  10 reps    Bridge Limitations  2 sets    Straight Leg Raise  10 reps    Straight Leg Raises Limitations  2 sets, BLE, cues for posterior pelvic rotation      Lumbar Exercises: Sidelying   Clam  Both;10 reps    Clam Limitations  2 sets, RTB      Manual Therapy   Manual Therapy  Joint mobilization    Manual therapy comments  completed separate rest of treatment    Joint Mobilization  Grade II-III PA joint mobs to T6-L2 (most tender at L1)            PT Education - 12/22/17 1538    Education provided  Yes    Education Details  reviewed goals, exercise  technique, intitiated HEP    Person(s) Educated  Patient    Methods  Explanation;Demonstration;Handout    Comprehension  Verbalized understanding;Returned demonstration       PT Short  Term Goals - 12/21/17 1625      PT SHORT TERM GOAL #1   Title  MMT In RLE to increase by 1/2 MMT grading.     Time  4    Period  Weeks    Status  New    Target Date  01/20/18      PT SHORT TERM GOAL #2   Title  SLS to improve to >10 sec bilat.     Time  4    Period  Weeks    Status  New    Target Date  01/20/18      PT SHORT TERM GOAL #3   Title  Pt will toelrate 6MWT @>0.38m/s.     Time  4    Period  Weeks    Status  New    Target Date  01/20/18        PT Long Term Goals - 12/21/17 1627      PT LONG TERM GOAL #1   Title   RLE MMT to improve 1 grade.     Time  8    Period  Weeks    Status  New    Target Date  02/20/18      PT LONG TERM GOAL #2   Title  SLS balance > 30sec bilat     Time  8    Period  Weeks    Status  New    Target Date  02/20/18      PT LONG TERM GOAL #3   Title  Regularly tolerating AMB >15 minutes 2-3x/week.     Time  8    Period  Weeks    Status  New    Target Date  02/21/18            Plan - 12/22/17 1604    Clinical Impression Statement  Began by reviewing goals and issuing copy of evaluation; no f/u questions. Rest of session focused on gluteal strengthening and addressing overall spinal mobility. Initiated HEP this date. Ended with manual joint mobs to thoracic and upper lumbar spine and pt noted to be very hypomobile throughout all segments, especially in T-spine with L1 being the most tender to Grade II-III CPAs. Continue as planned, progressing as able.    Rehab Potential  Fair    PT Frequency  2x / week    PT Duration  8 weeks    PT Treatment/Interventions  Moist Heat;Electrical Stimulation;Therapeutic activities;Therapeutic exercise;Balance training;Neuromuscular re-education;Gait training;Stair training;Functional mobility training;Dry  needling;Passive range of motion;Manual techniques    PT Next Visit Plan  continue gentle Lumbar spine mobility program (would benefit from thoracolumbar extension, without additional lumbosacral extension; continue gluteal strengthening and spinal mobs if with PT    Consulted and Agree with Plan of Care  Patient       Patient will benefit from skilled therapeutic intervention in order to improve the following deficits and impairments:  Hypomobility, Impaired sensation, Abnormal gait, Decreased activity tolerance, Decreased balance, Decreased range of motion, Decreased mobility, Decreased strength, Improper body mechanics, Postural dysfunction, Pain  Visit Diagnosis: Chronic midline low back pain without sciatica  Muscle weakness (generalized)     Problem List Patient Active Problem List   Diagnosis Date Noted  . Common migraine with intractable migraine 06/16/2017  . Tremor 11/16/2016  . Moderate episode of recurrent major depressive disorder ([place]) 07/26/2016  . Trochanteric bursitis of left hip 02/04/2016  . Trochanteric bursitis of both hips 10/31/2015  . Chest pain at rest  06/17/2015  . Inferior myocardial infarction ([facility]) 06/17/2015  . Sprain of interphalangeal joint of left little finger 02/05/2014  . Pain in joint, hand 02/05/2014  . Midsternal chest pain 06/04/2013  . CAD (coronary artery disease)   . Fibromyalgia   . GERD (gastroesophageal reflux disease)   . Migraine headache   . Acute non-STEMI < 8 weeks prior, subsequent admission after initial 01/16/2013  . Radial nerve laceration 03/27/2012  . Lack of coordination 03/27/2012  . Muscle weakness (generalized) 03/27/2012       Geraldine Solar PT, DPT  [facility] [address] [place], [name], [address] Phone: [phone]   Fax:  [phone]  Name: Stacey Lawrence MRN: [id number] Date of Birth: December 09, 1950

## 2017-12-22 NOTE — Patient Instructions (Signed)
Access Code: [email or web address]  URL: [email or web address]  Date: 12/22/2017  Prepared by: Geraldine Solar   Exercises  Supine Bridge - 10 reps - 2-3 sets - 1x daily - 7x weekly  Clamshell with Resistance - 10 reps - 2-3 sets - 1x daily - 7x weekly

## 2017-12-25 DIAGNOSIS — N281 Cyst of kidney, acquired: Secondary | ICD-10-CM | POA: Diagnosis not present

## 2017-12-26 ENCOUNTER — Other Ambulatory Visit: Payer: Self-pay | Admitting: Cardiovascular Disease

## 2017-12-26 DIAGNOSIS — M545 Low back pain: Secondary | ICD-10-CM | POA: Diagnosis not present

## 2017-12-26 DIAGNOSIS — M5136 Other intervertebral disc degeneration, lumbar region: Secondary | ICD-10-CM | POA: Diagnosis not present

## 2017-12-27 ENCOUNTER — Encounter (HOSPITAL_COMMUNITY): Payer: Self-pay

## 2017-12-27 ENCOUNTER — Ambulatory Visit (HOSPITAL_COMMUNITY): Payer: Medicare Other

## 2017-12-27 DIAGNOSIS — M545 Low back pain: Secondary | ICD-10-CM | POA: Diagnosis not present

## 2017-12-27 DIAGNOSIS — G8929 Other chronic pain: Secondary | ICD-10-CM

## 2017-12-27 DIAGNOSIS — M6281 Muscle weakness (generalized): Secondary | ICD-10-CM | POA: Diagnosis not present

## 2017-12-28 ENCOUNTER — Encounter (HOSPITAL_COMMUNITY): Payer: Self-pay

## 2017-12-28 NOTE — Therapy (Signed)
[place], [name], [address] Phone: [phone]   Fax:  [phone]  Physical Therapy Treatment  Patient Details  Name: Stacey Lawrence MRN: [id number] Date of Birth: Jan 31, 1951 Referring Provider: Ronette Deter    Encounter Date: 12/27/2017  PT End of Session - 12/28/17 1019    Visit Number  3    Number of Visits  16    Date for PT Re-Evaluation  02/20/18    Authorization Type  Medicare    Authorization Time Period  cert 09/21/22-4/0/10; reassessment on 01/20/18, ReEVAL on 02/20/18     PT Start Time  1302    PT Stop Time  1346    PT Time Calculation (min)  44 min    Activity Tolerance  Patient tolerated treatment well;No increased pain pain reduced to 4/10 post manual therapy    Behavior During Therapy  [facility] for tasks assessed/performed       Past Medical History:  Diagnosis Date  . Anemia    hx  . Anxiety   . CAD (coronary artery disease)    a. 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 05/2013 Lexi CL: EF 77%, old small inferolat scar, no ischemia.  . Common migraine with intractable migraine 06/16/2017  . Depression   . Fibromyalgia   . GERD (gastroesophageal reflux disease)    hasn't started taking any meds  . History of blood transfusion    24yrs ago  . Hx of colonic polyps   . IBS (irritable bowel syndrome)    constipation  . Joint pain   . Joint swelling   . Kidney stones 03/2010  . Migraine    "q other week" (02/04/2016)  . Myocardial infarction ([place])   . Numbness    right leg  . Polycythemia   . PONV (postoperative nausea and vomiting)   . Raynaud's disease   . Scoliosis   . Shortness of breath dyspnea    with exertion    Past Surgical History:   Procedure Laterality Date  . ABDOMINAL HYSTERECTOMY     partial  . APPENDECTOMY     with explor. lap.  Marland Kitchen CARDIAC CATHETERIZATION    . CHOLECYSTECTOMY  10/28/2002   lap.  . COLONOSCOPY    . DEBRIDEMENT TENNIS ELBOW    . DIAGNOSTIC LAPAROSCOPY    . DILATION AND CURETTAGE OF UTERUS    . ELBOW SURGERY     golfer's elbow-bilateral  . ESOPHAGOGASTRODUODENOSCOPY  09/10/2012   Procedure: ESOPHAGOGASTRODUODENOSCOPY (EGD);  Surgeon: Rogene Houston, MD;  [facility];  Service: Endoscopy;  Laterality: N/A;  730  . EXCISION/RELEASE BURSA HIP Left 02/04/2016   Procedure: LEFT HIP TROCHANTERIC BURSECTOMY;  Surgeon: Mcarthur Rossetti, MD;  Location: [place];  Service: Orthopedics;  Laterality: Left;  . I&D EXTREMITY  02/25/2012   Procedure: IRRIGATION AND DEBRIDEMENT EXTREMITY;  Surgeon: Roseanne Kaufman, MD;  Location: [place];  Service: Orthopedics;  Laterality: Right;  Irrigation and Debridement and Repair Right Thumb Nerve Laceration and Assoiated Structures   . KNEE DEBRIDEMENT     right  . LAPAROTOMY    . LEFT HEART CATHETERIZATION WITH CORONARY ANGIOGRAM N/A 01/16/2013   Procedure: LEFT HEART  CATHETERIZATION WITH CORONARY ANGIOGRAM;  Surgeon: Jolaine Artist, MD;  [facility] CATH LAB;  Service: Cardiovascular;  Laterality: N/A;  . STAPEDECTOMY     right  . TONSILLECTOMY AND ADENOIDECTOMY    . TRIGGER FINGER RELEASE  08/19/2011   Procedure: RELEASE TRIGGER FINGER/A-1 PULLEY;  Surgeon: Willa Frater III;  Location: [place];  Service: Orthopedics;  Laterality: Right;  right middle finger a-1 pulley release with tenosynovectomy  . TROCHANTERIC BURSA EXCISION Right 02/04/2016  . TUBAL LIGATION    . TYMPANOPLASTY      There were no vitals filed for this visit.  Subjective Assessment - 12/27/17 1301    Subjective  Saw Dr Rolena Infante yesterday. May have surgery on Rt SI joint An SI joint injection scheduled at end of month.     Pertinent History  Worked in [place] for  several decades, retired 2016.     Limitations  Sitting;Standing;Walking    How long can you sit comfortably?  about 30 minutes     How long can you stand comfortably?  about 45 minutes     How long can you walk comfortably?  about 30 minutes (shopping)     Diagnostic tests  MRI     Patient Stated Goals  tolerate sitting in car longer, tolerate work in garden/flowerbed; prevent need for spine surgery, prevent age-related osteoporosis-like hyper kyphosis     Pain Score  9     Pain Orientation  Lower    Pain Descriptors / Indicators  Dull;Aching    Pain Type  Chronic pain    Pain Onset  More than a month ago years    Pain Frequency  Constant            12/27/17 0001  Lumbar Exercises: Standing  Other Standing Lumbar Exercises hip ext 2x10  Other Standing Lumbar Exercises hip abd 2x10  Lumbar Exercises: Seated  Other Seated Lumbar Exercises lower thoracic extension over towel roll x10 at multiple levels  Other Seated Lumbar Exercises throacic rotation in chair x10 reps each  Sit to Stand 10 reps  Sit to Stand Limitations RTB around knees preventing knee valgus  Lumbar Exercises: Supine  Bridge 10 reps  Bridge Limitations 2 sets  Straight Leg Raise 10 reps  Straight Leg Raises Limitations 2 sets, BLE, cues for posterior pelvic rotation  Lumbar Exercises: Sidelying  Clam Both;10 reps  Clam Limitations 1 set, RTB  Manual Therapy  Manual Therapy Joint mobilization;Myofascial release  Manual therapy comments completed separate rest of treatment  Joint Mobilization Grade II-III PA jt mobs to T8-L4  Myofascial Release general spine rotational release in side-lying with overpressure through patient's breathing                       PT Education - 12/28/17 1018    Education provided  Yes    Education Details  clinical decision, joint mobility related to pain and long term muscle guarding, importance of abdominal set during all activities.    Person(s) Educated   Patient    Methods  Explanation    Comprehension  Verbalized understanding       PT Short Term Goals - 12/21/17 1625      PT SHORT TERM GOAL #1   Title  MMT In RLE to increase by 1/2 MMT grading.     Time  4    Period  Weeks    Status  New    Target Date  01/20/18  PT SHORT TERM GOAL #2   Title  SLS to improve to >10 sec bilat.     Time  4    Period  Weeks    Status  New    Target Date  01/20/18      PT SHORT TERM GOAL #3   Title  Pt will toelrate 6MWT @>0.51m/s.     Time  4    Period  Weeks    Status  New    Target Date  01/20/18        PT Long Term Goals - 12/21/17 1627      PT LONG TERM GOAL #1   Title   RLE MMT to improve 1 grade.     Time  8    Period  Weeks    Status  New    Target Date  02/20/18      PT LONG TERM GOAL #2   Title  SLS balance > 30sec bilat     Time  8    Period  Weeks    Status  New    Target Date  02/20/18      PT LONG TERM GOAL #3   Title  Regularly tolerating AMB >15 minutes 2-3x/week.     Time  8    Period  Weeks    Status  New    Target Date  02/21/18            Plan - 12/28/17 1021    Clinical Impression Statement  Patient tolerated treatment fairly well. Prone hip ext increased LBP so performed hip ext and abd standing bilaterally. Patient responded well to generalized myofascial release to spine through rotation side-lying, overpressure control by patient's depth of breathing. Reduced pain to 4/10 post manual therapy. Continbue as planned, progress as able; continue manual therapy and strengthening.     History and Personal Factors relevant to plan of care:  h/o MIx5, bilat hip bursitis, multiple surgeries    Clinical Presentation  Stable    Clinical Decision Making  High    Rehab Potential  Fair    PT Frequency  2x / week    PT Duration  8 weeks    PT Treatment/Interventions  Moist Heat;Electrical Stimulation;Therapeutic activities;Therapeutic exercise;Balance training;Neuromuscular re-education;Gait  training;Stair training;Functional mobility training;Dry needling;Passive range of motion;Manual techniques    PT Next Visit Plan  continue gentle thoracic and lumbar spine mobility program (would benefit from thoracolumbar extension, without additional lumbosacral extension; continue gluteal and abdominal strengthening and spinal mobs if with PT    Consulted and Agree with Plan of Care  Patient       Patient will benefit from skilled therapeutic intervention in order to improve the following deficits and impairments:  Hypomobility, Impaired sensation, Abnormal gait, Decreased activity tolerance, Decreased balance, Decreased range of motion, Decreased mobility, Decreased strength, Improper body mechanics, Postural dysfunction, Pain, Increased fascial restricitons  Visit Diagnosis: Chronic midline low back pain without sciatica  Muscle weakness (generalized)     Problem List Patient Active Problem List   Diagnosis Date Noted  . Common migraine with intractable migraine 06/16/2017  . Tremor 11/16/2016  . Moderate episode of recurrent major depressive disorder ([place]) 07/26/2016  . Trochanteric bursitis of left hip 02/04/2016  . Trochanteric bursitis of both hips 10/31/2015  . Chest pain at rest 06/17/2015  . Inferior myocardial infarction ([place]) 06/17/2015  . Sprain of interphalangeal joint of left little finger 02/05/2014  . Pain in joint, hand 02/05/2014  . Midsternal  chest pain 06/04/2013  . CAD (coronary artery disease)   . Fibromyalgia   . GERD (gastroesophageal reflux disease)   . Migraine headache   . Acute non-STEMI < 8 weeks prior, subsequent admission after initial 01/16/2013  . Radial nerve laceration 03/27/2012  . Lack of coordination 03/27/2012  . Muscle weakness (generalized) 03/27/2012    Jeannie Done 12/28/2017, 10:26 AM  [place], [name], [address] Phone: [phone]   Fax:   [phone]  Name: THURSA EMME MRN: [id number] Date of Birth: 1951/09/12

## 2017-12-29 ENCOUNTER — Ambulatory Visit (HOSPITAL_COMMUNITY): Payer: Medicare Other

## 2017-12-29 DIAGNOSIS — M545 Low back pain: Principal | ICD-10-CM

## 2017-12-29 DIAGNOSIS — M6281 Muscle weakness (generalized): Secondary | ICD-10-CM

## 2017-12-29 DIAGNOSIS — G8929 Other chronic pain: Secondary | ICD-10-CM

## 2017-12-29 NOTE — Therapy (Signed)
[place], [name], [address] Phone: [phone]   Fax:  [phone]  Physical Therapy Treatment  Patient Details  Name: Stacey Lawrence MRN: [id number] Date of Birth: 06-19-1951 Referring Provider: Ronette Deter    Encounter Date: 12/29/2017  PT End of Session - 12/29/17 1314    Visit Number  4    Number of Visits  16    Date for PT Re-Evaluation  02/20/18    Authorization Type  Medicare    Authorization Time Period  cert 02/22/58-05/22/56; reassessment on 01/20/18, ReEVAL on 02/20/18     PT Start Time  1301    PT Stop Time  1341    PT Time Calculation (min)  40 min    Activity Tolerance  Patient tolerated treatment well;No increased pain    Behavior During Therapy  [facility] for tasks assessed/performed       Past Medical History:  Diagnosis Date  . Anemia    hx  . Anxiety   . CAD (coronary artery disease)    a. 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 05/2013 Lexi CL: EF 77%, old small inferolat scar, no ischemia.  . Common migraine with intractable migraine 06/16/2017  . Depression   . Fibromyalgia   . GERD (gastroesophageal reflux disease)    hasn't started taking any meds  . History of blood transfusion    32yrs ago  . Hx of colonic polyps   . IBS (irritable bowel syndrome)    constipation  . Joint pain   . Joint swelling   . Kidney stones 03/2010  . Migraine    "q other week" (02/04/2016)  . Myocardial infarction ([place])   . Numbness    right leg  . Polycythemia   . PONV (postoperative nausea and vomiting)   . Raynaud's disease   . Scoliosis   . Shortness of breath dyspnea    with exertion    Past Surgical History:  Procedure Laterality Date  . ABDOMINAL  HYSTERECTOMY     partial  . APPENDECTOMY     with explor. lap.  Marland Kitchen CARDIAC CATHETERIZATION    . CHOLECYSTECTOMY  10/28/2002   lap.  . COLONOSCOPY    . DEBRIDEMENT TENNIS ELBOW    . DIAGNOSTIC LAPAROSCOPY    . DILATION AND CURETTAGE OF UTERUS    . ELBOW SURGERY     golfer's elbow-bilateral  . ESOPHAGOGASTRODUODENOSCOPY  09/10/2012   Procedure: ESOPHAGOGASTRODUODENOSCOPY (EGD);  Surgeon: Rogene Houston, MD;  [facility];  Service: Endoscopy;  Laterality: N/A;  730  . EXCISION/RELEASE BURSA HIP Left 02/04/2016   Procedure: LEFT HIP TROCHANTERIC BURSECTOMY;  Surgeon: Mcarthur Rossetti, MD;  Location: [place];  Service: Orthopedics;  Laterality: Left;  . I&D EXTREMITY  02/25/2012   Procedure: IRRIGATION AND DEBRIDEMENT EXTREMITY;  Surgeon: Roseanne Kaufman, MD;  Location: [place];  Service: Orthopedics;  Laterality: Right;  Irrigation and Debridement and Repair Right Thumb Nerve Laceration and Assoiated Structures   . KNEE DEBRIDEMENT     right  . LAPAROTOMY    . LEFT HEART CATHETERIZATION WITH CORONARY ANGIOGRAM N/A 01/16/2013   Procedure: LEFT HEART CATHETERIZATION WITH CORONARY ANGIOGRAM;  Surgeon: Quillian Quince  R Bensimhon, MD;  Location: [place] CATH LAB;  Service: Cardiovascular;  Laterality: N/A;  . STAPEDECTOMY     right  . TONSILLECTOMY AND ADENOIDECTOMY    . TRIGGER FINGER RELEASE  08/19/2011   Procedure: RELEASE TRIGGER FINGER/A-1 PULLEY;  Surgeon: Willa Frater III;  Location: [place];  Service: Orthopedics;  Laterality: Right;  right middle finger a-1 pulley release with tenosynovectomy  . TROCHANTERIC BURSA EXCISION Right 02/04/2016  . TUBAL LIGATION    . TYMPANOPLASTY      There were no vitals filed for this visit.  Subjective Assessment - 12/29/17 1303    Subjective  Pt doign ok today, Taking it easy overall. She reports she is loking forward to SIJ injection which may lead to a SIJ fusion in the future and completely.     Currently in Pain?  Yes    Pain  Score  6     Pain Location  Back lower back from side to side.                        [facility] PT Treatment/Exercise - 12/29/17 0001      Lumbar Exercises: Stretches   Single Knee to Chest Stretch  2 reps;30 seconds;Left;Right thomas test postiion for hip flexor stretch     Double Knee to Chest Stretch  3 reps 3x25times end range oscillation      Lumbar Exercises: Supine   Bent Knee Raise  -- bilat 2x10x3secH    Bridge  10 reps range is limited    Bridge Limitations  2 sets      Lumbar Exercises: Prone   Other Prone Lumbar Exercises  Quads Stretch with rope:  2x30sec, pillow under belly to avoid lordotic pain       Manual Therapy   Manual Therapy  Joint mobilization;Myofascial release;Passive ROM    Joint Mobilization  Gr:II T10-L2 2x30sec, stiff, but improves with repitition    Myofascial Release  Right Paraspinals T12-L2 Left posterio glute med near iliac crest: 5 minutes    Passive ROM  Thopraco lumbar towel sttretch in hooklying: 1x8 minutes             PT Education - 12/28/17 1018    Education provided  Yes    Education Details  clinical decision, joint mobility related to pain and long term muscle guarding, importance of abdominal set during all activities.    Person(s) Educated  Patient    Methods  Explanation    Comprehension  Verbalized understanding       PT Short Term Goals - 12/21/17 1625      PT SHORT TERM GOAL #1   Title  MMT In RLE to increase by 1/2 MMT grading.     Time  4    Period  Weeks    Status  New    Target Date  01/20/18      PT SHORT TERM GOAL #2   Title  SLS to improve to >10 sec bilat.     Time  4    Period  Weeks    Status  New    Target Date  01/20/18      PT SHORT TERM GOAL #3   Title  Pt will toelrate 6MWT @>0.12m/s.     Time  4    Period  Weeks    Status  New    Target Date  01/20/18        PT Long  Term Goals - 12/21/17 1627      PT LONG TERM GOAL #1   Title   RLE MMT to improve 1 grade.      Time  8    Period  Weeks    Status  New    Target Date  02/20/18      PT LONG TERM GOAL #2   Title  SLS balance > 30sec bilat     Time  8    Period  Weeks    Status  New    Target Date  02/20/18      PT LONG TERM GOAL #3   Title  Regularly tolerating AMB >15 minutes 2-3x/week.     Time  8    Period  Weeks    Status  New    Target Date  02/21/18            Plan - 12/29/17 1315    Clinical Impression Statement  Continued with targeting hip and lumbar strength and mobility. Notable restrictions in both psoas and rectus femoris this date not identified at evaluation. Pt demonstrating improved excursion in supine bridging. Making good progress thus far.     Rehab Potential  Fair    PT Frequency  2x / week    PT Duration  8 weeks    PT Treatment/Interventions  Moist Heat;Electrical Stimulation;Therapeutic activities;Therapeutic exercise;Balance training;Neuromuscular re-education;Gait training;Stair training;Functional mobility training;Dry needling;Passive range of motion;Manual techniques    PT Next Visit Plan  continue gentle thoracic and lumbar spine mobility program (would benefit from thoracolumbar extension, without additional lumbosacral extension; continue gluteal and abdominal strengthening and spinal mobs if with PT    Consulted and Agree with Plan of Care  Patient       Patient will benefit from skilled therapeutic intervention in order to improve the following deficits and impairments:  Hypomobility, Impaired sensation, Abnormal gait, Decreased activity tolerance, Decreased balance, Decreased range of motion, Decreased mobility, Decreased strength, Improper body mechanics, Postural dysfunction, Pain, Increased fascial restricitons  Visit Diagnosis: Chronic midline low back pain without sciatica  Muscle weakness (generalized)     Problem List Patient Active Problem List   Diagnosis Date Noted  . Common migraine with intractable migraine 06/16/2017  . Tremor  11/16/2016  . Moderate episode of recurrent major depressive disorder ([place]) 07/26/2016  . Trochanteric bursitis of left hip 02/04/2016  . Trochanteric bursitis of both hips 10/31/2015  . Chest pain at rest 06/17/2015  . Inferior myocardial infarction ([place]) 06/17/2015  . Sprain of interphalangeal joint of left little finger 02/05/2014  . Pain in joint, hand 02/05/2014  . Midsternal chest pain 06/04/2013  . CAD (coronary artery disease)   . Fibromyalgia   . GERD (gastroesophageal reflux disease)   . Migraine headache   . Acute non-STEMI < 8 weeks prior, subsequent admission after initial 01/16/2013  . Radial nerve laceration 03/27/2012  . Lack of coordination 03/27/2012  . Muscle weakness (generalized) 03/27/2012    1:47 PM, 12/29/17 Etta Grandchild, PT, DPT Physical Therapist at [facility] [phone] (office)      Etta Grandchild 12/29/2017, 1:47 PM  [place] [address] [place], [name], [address] Phone: [phone]   Fax:  [phone]  Name: Stacey Lawrence MRN: [id number] Date of Birth: Sep 20, 1950

## 2018-01-03 ENCOUNTER — Ambulatory Visit (INDEPENDENT_AMBULATORY_CARE_PROVIDER_SITE_OTHER): Payer: Medicare Other | Admitting: Psychiatry

## 2018-01-03 ENCOUNTER — Encounter (HOSPITAL_COMMUNITY): Payer: Self-pay | Admitting: Psychiatry

## 2018-01-03 ENCOUNTER — Encounter (HOSPITAL_COMMUNITY): Payer: Self-pay

## 2018-01-03 ENCOUNTER — Ambulatory Visit (HOSPITAL_COMMUNITY): Payer: Medicare Other

## 2018-01-03 ENCOUNTER — Other Ambulatory Visit: Payer: Self-pay

## 2018-01-03 DIAGNOSIS — M545 Low back pain: Principal | ICD-10-CM

## 2018-01-03 DIAGNOSIS — G8929 Other chronic pain: Secondary | ICD-10-CM

## 2018-01-03 DIAGNOSIS — M6281 Muscle weakness (generalized): Secondary | ICD-10-CM

## 2018-01-03 DIAGNOSIS — F331 Major depressive disorder, recurrent, moderate: Secondary | ICD-10-CM
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
## 2018-01-03 NOTE — Therapy (Signed)
[place], [name], [address] Phone: [phone]   Fax:  [phone]  Physical Therapy Treatment  Patient Details  Name: Stacey Lawrence MRN: [id number] Date of Birth: [birth year] Referring Provider: Ronette Deter    Encounter Date: 01/03/2018  PT End of Session - 01/03/18 1051    Visit Number  5    Number of Visits  16    Date for PT Re-Evaluation  02/20/18    Authorization Type  Medicare    Authorization Time Period  cert 02/25/61-05/25/27; reassessment on 01/20/18, ReEVAL on 02/20/18     Authorization - Visit Number  5    Authorization - Number of Visits  10    PT Start Time  4132    PT Stop Time  1117    PT Time Calculation (min)  42 min    Activity Tolerance  Patient tolerated treatment well;No increased pain    Behavior During Therapy  WFL for tasks assessed/performed       Past Medical History:  Diagnosis Date  . Anemia    hx  . Anxiety   . CAD (coronary artery disease)    a. 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 05/2013 [facility] CL: EF 77%, old small inferolat scar, no ischemia.  . Common migraine with intractable migraine 06/16/2017  . Depression   . Fibromyalgia   . GERD (gastroesophageal reflux disease)    hasn't started taking any meds  . History of blood transfusion    90yrs ago  . Hx of colonic polyps   . IBS (irritable bowel syndrome)    constipation  . Joint pain   . Joint swelling   . Kidney stones 03/2010  . Migraine    "q other week" (02/04/2016)  . Myocardial infarction ([place])   . Numbness    right leg  . Polycythemia   . PONV (postoperative nausea and vomiting)   . Raynaud's disease   . Scoliosis   . Shortness of breath dyspnea    with exertion     Past Surgical History:  Procedure Laterality Date  . ABDOMINAL HYSTERECTOMY     partial  . APPENDECTOMY     with explor. lap.  Marland Kitchen CARDIAC CATHETERIZATION    . CHOLECYSTECTOMY  10/28/2002   lap.  . COLONOSCOPY    . DEBRIDEMENT TENNIS ELBOW    . DIAGNOSTIC LAPAROSCOPY    . DILATION AND CURETTAGE OF UTERUS    . ELBOW SURGERY     golfer's elbow-bilateral  . ESOPHAGOGASTRODUODENOSCOPY  09/10/2012   Procedure: ESOPHAGOGASTRODUODENOSCOPY (EGD);  Surgeon: Rogene Houston, MD;  [facility];  Service: Endoscopy;  Laterality: N/A;  730  . EXCISION/RELEASE BURSA HIP Left 02/04/2016   Procedure: LEFT HIP TROCHANTERIC BURSECTOMY;  Surgeon: Mcarthur Rossetti, MD;  Location: [facility];  Service: Orthopedics;  Laterality: Left;  . I&D EXTREMITY  02/25/2012   Procedure: IRRIGATION AND DEBRIDEMENT EXTREMITY;  Surgeon: Roseanne Kaufman, MD;  Location: [facility];  Service: Orthopedics;  Laterality: Right;  Irrigation and Debridement and Repair Right Thumb Nerve Laceration and Assoiated Structures   . KNEE DEBRIDEMENT     right  . LAPAROTOMY    . LEFT  HEART CATHETERIZATION WITH CORONARY ANGIOGRAM N/A 01/16/2013   Procedure: LEFT HEART CATHETERIZATION WITH CORONARY ANGIOGRAM;  Surgeon: Jolaine Artist, MD;  Location: [facility] CATH LAB;  Service: Cardiovascular;  Laterality: N/A;  . STAPEDECTOMY     right  . TONSILLECTOMY AND ADENOIDECTOMY    . TRIGGER FINGER RELEASE  08/19/2011   Procedure: RELEASE TRIGGER FINGER/A-1 PULLEY;  Surgeon: Willa Frater III;  Location: [facility];  Service: Orthopedics;  Laterality: Right;  right middle finger a-1 pulley release with tenosynovectomy  . TROCHANTERIC BURSA EXCISION Right 02/04/2016  . TUBAL LIGATION    . TYMPANOPLASTY      There were no vitals filed for this visit.  Subjective Assessment - 01/03/18 1052    Subjective  Patient reports she is doing ok today. She states saw Dr. Rolena Infante for a follow-up and she is planning to have an injection  in her iliosacral joint bilaterally to reduce pain. She states if the injection helps she will then plan to have surgery as Dr. Rolena Infante states this would indicate it is her joint that is the cause of her pain. She reports she is doing her exercises daily.    Pertinent History  Worked in [place] for several decades, retired 2016.     Limitations  Sitting;Standing;Walking    Patient Stated Goals  tolerate sitting in car longer, tolerate work in garden/flowerbed; prevent need for spine surgery, prevent age-related osteoporosis-like hyper kyphosis     Currently in Pain?  Yes    Pain Score  5     Pain Location  Back    Pain Orientation  Lower    Pain Descriptors / Indicators  Aching;Dull    Pain Type  Chronic pain    Pain Onset  More than a month ago    Pain Frequency  Constant        [facility] Adult PT Treatment/Exercise - 01/03/18 0001      Lumbar Exercises: Stretches   Single Knee to Chest Stretch  5 reps;10 seconds    Double Knee to Chest Stretch  3 reps;30 seconds;Limitations    Double Knee to Chest Stretch Limitations  oscillations at end range      Lumbar Exercises: Supine   Ab Set  20 reps;3 seconds    Pelvic Tilt  20 reps;5 seconds    Bridge  15 reps;3 seconds    Bridge Limitations  2 sets      Lumbar Exercises: Sidelying   Hip Abduction  Both;15 reps;2 seconds;Limitations    Hip Abduction Limitations  patient with hand on low back/hip to prevent posterior rolling      Manual Therapy   Manual Therapy  Joint mobilization;Myofascial release;Passive ROM    Joint Mobilization  AP grade II-III for L5-T8 ; 3x 30-45 second oscillations    Myofascial Release  Bil Paraspinal and quadradus lumborem soft tissue mobilization to reduce myofascial restrictions. decreased tissue turgor noted throughout intervention.        PT Education - 01/03/18 1050    Education provided  Yes    Education Details  educated on time needed in therapy to improve SIJ pain/dicomfort and help increase muscle  strength to improve stability. Educated on exercise form throughuot with new exercises.     Person(s) Educated  Patient    Methods  Explanation    Comprehension  Verbalized understanding       PT Short Term Goals - 12/21/17 1625      PT SHORT TERM GOAL #1  Title  MMT In RLE to increase by 1/2 MMT grading.     Time  4    Period  Weeks    Status  New    Target Date  01/20/18      PT SHORT TERM GOAL #2   Title  SLS to improve to >10 sec bilat.     Time  4    Period  Weeks    Status  New    Target Date  01/20/18      PT SHORT TERM GOAL #3   Title  Pt will toelrate 6MWT @>0.59m/s.     Time  4    Period  Weeks    Status  New    Target Date  01/20/18        PT Long Term Goals - 12/21/17 1627      PT LONG TERM GOAL #1   Title   RLE MMT to improve 1 grade.     Time  8    Period  Weeks    Status  New    Target Date  02/20/18      PT LONG TERM GOAL #2   Title  SLS balance > 30sec bilat     Time  8    Period  Weeks    Status  New    Target Date  02/20/18      PT LONG TERM GOAL #3   Title  Regularly tolerating AMB >15 minutes 2-3x/week.     Time  8    Period  Weeks    Status  New    Target Date  02/21/18        Plan - 01/03/18 1051    Clinical Impression Statement  Continued with current POC this session with exercises to strengthen hips and core muscles as well as lumbar mobility stretches. Patient with improved activity tolerance and no increase in pain with increase in repetition. Ab set with TA activation and posterior pelvic tilt added today to facilitate postural/core strengthening. Patient required minimal verbal/tactile cues to achieve proper form. She will continue to benefit from skilled PT services to address impairments and progress towards goals to improve QOL.    Rehab Potential  Fair    PT Frequency  2x / week    PT Duration  8 weeks    PT Treatment/Interventions  Moist Heat;Electrical Stimulation;Therapeutic activities;Therapeutic exercise;Balance  training;Neuromuscular re-education;Gait training;Stair training;Functional mobility training;Dry needling;Passive range of motion;Manual techniques    PT Next Visit Plan  Continue gentle thoracic and lumbar spine mobility program (would benefit from thoracolumbar extension, without additional lumbosacral extension; continue gluteal and abdominal strengthening and spinal mobs if with PT. Progress to dead bug and bird dog next session.    Consulted and Agree with Plan of Care  Patient       Patient will benefit from skilled therapeutic intervention in order to improve the following deficits and impairments:  Hypomobility, Impaired sensation, Abnormal gait, Decreased activity tolerance, Decreased balance, Decreased range of motion, Decreased mobility, Decreased strength, Improper body mechanics, Postural dysfunction, Pain, Increased fascial restricitons  Visit Diagnosis: Chronic midline low back pain without sciatica  Muscle weakness (generalized)     Problem List Patient Active Problem List   Diagnosis Date Noted  . Common migraine with intractable migraine 06/16/2017  . Tremor 11/16/2016  . Moderate episode of recurrent major depressive disorder ([place]) 07/26/2016  . Trochanteric bursitis of left hip 02/04/2016  . Trochanteric bursitis of both hips 10/31/2015  .  Chest pain at rest 06/17/2015  . Inferior myocardial infarction ([place]) 06/17/2015  . Sprain of interphalangeal joint of left little finger 02/05/2014  . Pain in joint, hand 02/05/2014  . Midsternal chest pain 06/04/2013  . CAD (coronary artery disease)   . Fibromyalgia   . GERD (gastroesophageal reflux disease)   . Migraine headache   . Acute non-STEMI < 8 weeks prior, subsequent admission after initial 01/16/2013  . Radial nerve laceration 03/27/2012  . Lack of coordination 03/27/2012  . Muscle weakness (generalized) 03/27/2012    Kipp Brood, PT, DPT Physical Therapist with [facility]  01/03/2018 12:22 PM    [place] [address] [place], [name], [address] Phone: [phone]   Fax:  [phone]  Name: STEVEY STAPLETON MRN: [id number] Date of Birth: Feb 13, 1951

## 2018-01-05 ENCOUNTER — Ambulatory Visit (HOSPITAL_COMMUNITY): Payer: Medicare Other | Admitting: Physical Therapy

## 2018-01-05 ENCOUNTER — Encounter (HOSPITAL_COMMUNITY): Payer: Self-pay | Admitting: Physical Therapy

## 2018-01-05 DIAGNOSIS — M6281 Muscle weakness (generalized): Secondary | ICD-10-CM | POA: Diagnosis not present

## 2018-01-05 DIAGNOSIS — G8929 Other chronic pain: Secondary | ICD-10-CM | POA: Diagnosis not present

## 2018-01-05 DIAGNOSIS — M545 Low back pain: Secondary | ICD-10-CM | POA: Diagnosis not present

## 2018-01-05 NOTE — Therapy (Signed)
[place], [name], [address] Phone: [phone]   Fax:  [phone]  Physical Therapy Treatment  Patient Details  Name: Stacey Lawrence MRN: [id number] Date of Birth: March 31, 1951 Referring Provider: Ronette Deter    Encounter Date: 01/05/2018  PT End of Session - 01/05/18 1026    Visit Number  6    Number of Visits  16    Date for PT Re-Evaluation  02/20/18    Authorization Type  Medicare    Authorization Time Period  cert 09/25/99-0/9/32; reassessment on 01/20/18, ReEVAL on 02/20/18     Authorization - Visit Number  6    Authorization - Number of Visits  10    PT Start Time  1020    PT Stop Time  1101    PT Time Calculation (min)  41 min    Activity Tolerance  Patient tolerated treatment well;No increased pain    Behavior During Therapy  WFL for tasks assessed/performed       Past Medical History:  Diagnosis Date  . Anemia    hx  . Anxiety   . CAD (coronary artery disease)    a. 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 05/2013 [facility] CL: EF 77%, old small inferolat scar, no ischemia.  . Common migraine with intractable migraine 06/16/2017  . Depression   . Fibromyalgia   . GERD (gastroesophageal reflux disease)    hasn't started taking any meds  . History of blood transfusion    66yrs ago  . Hx of colonic polyps   . IBS (irritable bowel syndrome)    constipation  . Joint pain   . Joint swelling   . Kidney stones 03/2010  . Migraine    "q other week" (02/04/2016)  . Myocardial infarction ([place])   . Numbness    right leg  . Polycythemia   . PONV (postoperative nausea and vomiting)   . Raynaud's disease   . Scoliosis   . Shortness of breath dyspnea    with exertion     Past Surgical History:  Procedure Laterality Date  . ABDOMINAL HYSTERECTOMY     partial  . APPENDECTOMY     with explor. lap.  Marland Kitchen CARDIAC CATHETERIZATION    . CHOLECYSTECTOMY  10/28/2002   lap.  . COLONOSCOPY    . DEBRIDEMENT TENNIS ELBOW    . DIAGNOSTIC LAPAROSCOPY    . DILATION AND CURETTAGE OF UTERUS    . ELBOW SURGERY     golfer's elbow-bilateral  . ESOPHAGOGASTRODUODENOSCOPY  09/10/2012   Procedure: ESOPHAGOGASTRODUODENOSCOPY (EGD);  Surgeon: Rogene Houston, MD;  [facility];  Service: Endoscopy;  Laterality: N/A;  730  . EXCISION/RELEASE BURSA HIP Left 02/04/2016   Procedure: LEFT HIP TROCHANTERIC BURSECTOMY;  Surgeon: Mcarthur Rossetti, MD;  Location: [facility];  Service: Orthopedics;  Laterality: Left;  . I&D EXTREMITY  02/25/2012   Procedure: IRRIGATION AND DEBRIDEMENT EXTREMITY;  Surgeon: Roseanne Kaufman, MD;  Location: [facility];  Service: Orthopedics;  Laterality: Right;  Irrigation and Debridement and Repair Right Thumb Nerve Laceration and Assoiated Structures   . KNEE DEBRIDEMENT     right  . LAPAROTOMY    . LEFT  HEART CATHETERIZATION WITH CORONARY ANGIOGRAM N/A 01/16/2013   Procedure: LEFT HEART CATHETERIZATION WITH CORONARY ANGIOGRAM;  Surgeon: Jolaine Artist, MD;  Location: [facility] CATH LAB;  Service: Cardiovascular;  Laterality: N/A;  . STAPEDECTOMY     right  . TONSILLECTOMY AND ADENOIDECTOMY    . TRIGGER FINGER RELEASE  08/19/2011   Procedure: RELEASE TRIGGER FINGER/A-1 PULLEY;  Surgeon: Willa Frater III;  Location: [facility];  Service: Orthopedics;  Laterality: Right;  right middle finger a-1 pulley release with tenosynovectomy  . TROCHANTERIC BURSA EXCISION Right 02/04/2016  . TUBAL LIGATION    . TYMPANOPLASTY      There were no vitals filed for this visit.  Subjective Assessment - 01/05/18 1024    Subjective  Patient stated she was sitting for many hours yesterday which caused her back to be hurting more. She said today is one  of the worst she has had in a while due to the pain she is having.     Pertinent History  Worked in [place] for several decades, retired 2016.     Limitations  Sitting;Standing;Walking    Patient Stated Goals  tolerate sitting in car longer, tolerate work in garden/flowerbed; prevent need for spine surgery, prevent age-related osteoporosis-like hyper kyphosis     Currently in Pain?  Yes    Pain Score  9     Pain Location  Back    Pain Orientation  Lower    Pain Descriptors / Indicators  Aching;Dull    Pain Type  Chronic pain    Pain Radiating Towards  Hips also    Pain Onset  More than a month ago                       [facility] Adult PT Treatment/Exercise - 01/05/18 0001      Lumbar Exercises: Stretches   Single Knee to Chest Stretch  5 reps;10 seconds;Other (comment) Each lower extremity    Double Knee to Chest Stretch  3 reps;30 seconds;Limitations    Double Knee to Chest Stretch Limitations  oscillations at end range      Lumbar Exercises: Supine   Ab Set  20 reps;5 seconds    Pelvic Tilt  20 reps;5 seconds    Dead Bug  10 reps;Other (comment) 10 repetitions each lower extremity    Bridge  15 reps;3 seconds    Bridge Limitations  2 sets      Lumbar Exercises: Sidelying   Hip Abduction  Both;2 seconds;Limitations;Other (comment) 15 repetitions on the left 10 repetitions on the right    Hip Abduction Limitations  Therapist provided tactile cues to prevent patient leaning back word      Lumbar Exercises: Quadruped   Other Quadruped Lumbar Exercises  Birddog opposite arm and leg 20 repetitions, alternating legs 10x on each leg      Manual Therapy   Manual Therapy  Joint mobilization;Soft tissue mobilization    Joint Mobilization  AP grade II-III for L5-T8 ; 3x 30-45 second oscillations    Soft tissue mobilization  Bil Paraspinal and quadradus lumborum soft tissue mobilization to reduce muscular restrictions. decreased tissue turgor noted throughout intervention.              PT Education - 01/05/18 1026    Education provided  Yes    Education Details  Educated on purpose and technique of exercises throughout session.     Person(s) Educated  Patient    Methods  Verbal cues;Tactile  cues;Explanation    Comprehension  Verbalized understanding;Returned demonstration       PT Short Term Goals - 12/21/17 1625      PT SHORT TERM GOAL #1   Title  MMT In RLE to increase by 1/2 MMT grading.     Time  4    Period  Weeks    Status  New    Target Date  01/20/18      PT SHORT TERM GOAL #2   Title  SLS to improve to >10 sec bilat.     Time  4    Period  Weeks    Status  New    Target Date  01/20/18      PT SHORT TERM GOAL #3   Title  Pt will toelrate 6MWT @>0.46m/s.     Time  4    Period  Weeks    Status  New    Target Date  01/20/18        PT Long Term Goals - 12/21/17 1627      PT LONG TERM GOAL #1   Title   RLE MMT to improve 1 grade.     Time  8    Period  Weeks    Status  New    Target Date  02/20/18      PT LONG TERM GOAL #2   Title  SLS balance > 30sec bilat     Time  8    Period  Weeks    Status  New    Target Date  02/20/18      PT LONG TERM GOAL #3   Title  Regularly tolerating AMB >15 minutes 2-3x/week.     Time  8    Period  Weeks    Status  New    Target Date  02/21/18            Plan - 01/05/18 1109    Clinical Impression Statement  Patient has a lot of pain at this session due [place] This session patient performed abdominal sets for 5 second holds. This session added bird-dog exercise in quadruped and deadbug exercise. During deadbug exercise patient required verbal cues to continue breathing and not hold her breath. During birddog exercise noted shakiness and some instability. Patient was unable to complete all hip abduction on the right side due to pain with laying on the left hip. Therapist performed manual therapy this session to improve relaxation  and decrease pain and to improve mobility of muscles. Noted some decrease in muscular restrictions following manual therapy, however patient continued to report a 9/10 pain at the end of the session.     Rehab Potential  Fair    PT Frequency  2x / week    PT Duration  8 weeks    PT Treatment/Interventions  Moist Heat;Electrical Stimulation;Therapeutic activities;Therapeutic exercise;Balance training;Neuromuscular re-education;Gait training;Stair training;Functional mobility training;Dry needling;Passive range of motion;Manual techniques    PT Next Visit Plan  Continue gentle thoracic and lumbar spine mobility program (would benefit from thoracolumbar extension, without additional lumbosacral extension; continue gluteal and abdominal strengthening and spinal mobs if with PT. Progress to dead bug and bird dog next session.    Consulted and Agree with Plan of Care  Patient       Patient will benefit from skilled therapeutic intervention in order to improve the following deficits and impairments:  Hypomobility, Impaired sensation, Abnormal gait, Decreased activity tolerance, Decreased balance,  Decreased range of motion, Decreased mobility, Decreased strength, Improper body mechanics, Postural dysfunction, Pain, Increased fascial restricitons  Visit Diagnosis: Chronic midline low back pain without sciatica  Muscle weakness (generalized)     Problem List Patient Active Problem List   Diagnosis Date Noted  . Common migraine with intractable migraine 06/16/2017  . Tremor 11/16/2016  . Moderate episode of recurrent major depressive disorder ([place]) 07/26/2016  . Trochanteric bursitis of left hip 02/04/2016  . Trochanteric bursitis of both hips 10/31/2015  . Chest pain at rest 06/17/2015  . Inferior myocardial infarction ([place]) 06/17/2015  . Sprain of interphalangeal joint of left little finger 02/05/2014  . Pain in joint, hand 02/05/2014  . Midsternal chest pain 06/04/2013  . CAD (coronary  artery disease)   . Fibromyalgia   . GERD (gastroesophageal reflux disease)   . Migraine headache   . Acute non-STEMI < 8 weeks prior, subsequent admission after initial 01/16/2013  . Radial nerve laceration 03/27/2012  . Lack of coordination 03/27/2012  . Muscle weakness (generalized) 03/27/2012   Clarene Critchley PT, DPT 11:12 AM, 01/05/18 [place], [name], [address] Phone: [phone]   Fax:  [phone]  Name: Stacey Lawrence MRN: [id number] Date of Birth: [birth year]

## 2018-01-09 ENCOUNTER — Telehealth (HOSPITAL_COMMUNITY): Payer: Self-pay | Admitting: Internal Medicine

## 2018-01-09 ENCOUNTER — Ambulatory Visit (HOSPITAL_COMMUNITY): Payer: Medicare Other

## 2018-01-09 NOTE — Telephone Encounter (Signed)
01/09/18  pt called and said that her dr wanted her to hold off therapy until after her surgery

## 2018-01-10 ENCOUNTER — Ambulatory Visit (HOSPITAL_COMMUNITY)
Admission: RE | Admit: 2018-01-10 | Discharge: 2018-01-10 | Disposition: A | Discharge status: Home / Self Care | Payer: Medicare Other | Source: Ambulatory Visit | Attending: Vascular Surgery | Admitting: Vascular Surgery

## 2018-01-10 DIAGNOSIS — I83893 Varicose veins of bilateral lower extremities with other complications: Secondary | ICD-10-CM | POA: Insufficient documentation

## 2018-01-11 ENCOUNTER — Other Ambulatory Visit: Payer: Self-pay

## 2018-01-11 ENCOUNTER — Ambulatory Visit (INDEPENDENT_AMBULATORY_CARE_PROVIDER_SITE_OTHER): Payer: Medicare Other | Admitting: Vascular Surgery

## 2018-01-11 ENCOUNTER — Encounter: Payer: Self-pay | Admitting: Vascular Surgery

## 2018-01-11 ENCOUNTER — Encounter (HOSPITAL_COMMUNITY): Payer: Self-pay

## 2018-01-11 VITALS — BP 114/69 | HR 77 | Temp 97.7°F | Resp 16 | Ht 67.5 in | Wt 153.0 lb

## 2018-01-11 DIAGNOSIS — I83813 Varicose veins of bilateral lower extremities with pain: Secondary | ICD-10-CM

## 2018-01-11 NOTE — Progress Notes (Signed)
Patient name: Stacey Lawrence MRN: [id number] DOB: 03-27-1951 Sex: female  HPI: Stacey Lawrence is a 67 y.o. female was last seen June 2016 for varicose veins of both lower extremities.  She did not have significant reflux at that point.  We discussed whether or not she wished to proceed with sclerotherapy and she deferred at that point opting for compression stockings.  She returns today still complaining of pain in the back of her legs primarily after standing on them all day.  She has some occasional swelling she states that sometimes her legs are even swollen in the morning.  She has worn some compression stockings intermittently but not religiously.  She also has significant back and hip problems and has been operated on her left hip by Dr. Ninfa Linden and is currently under evaluation by Dr. Rolena Infante for potential back operation.  She did have surface lasering of her left anterior thigh several years ago and had a good result from this.  She has a history of varicose veins in her mother and father.  She has no prior history of DVT.  Past Medical History:  Diagnosis Date  . Anemia    hx  . Anxiety   . CAD (coronary artery disease)    a. 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 05/2013 Lexi CL: EF 77%, old small inferolat scar, no ischemia.  . Common migraine with intractable migraine 06/16/2017  . Depression   . Fibromyalgia   . GERD (gastroesophageal reflux disease)    hasn't started taking any meds  . History of blood transfusion    83yrs ago  . Hx of colonic polyps   . IBS (irritable bowel syndrome)    constipation  . Joint pain   . Joint swelling   . Kidney stones 03/2010  . Migraine    "q other week" (02/04/2016)  . Myocardial infarction  ([place])   . Numbness    right leg  . Polycythemia   . PONV (postoperative nausea and vomiting)   . Raynaud's disease   . Scoliosis   . Shortness of breath dyspnea    with exertion   Past Surgical History:  Procedure Laterality Date  . ABDOMINAL HYSTERECTOMY     partial  . APPENDECTOMY     with explor. lap.  Marland Kitchen CARDIAC CATHETERIZATION    . CHOLECYSTECTOMY  10/28/2002   lap.  . COLONOSCOPY    . DEBRIDEMENT TENNIS ELBOW    . DIAGNOSTIC LAPAROSCOPY    . DILATION AND CURETTAGE OF UTERUS    . ELBOW SURGERY     golfer's elbow-bilateral  . ESOPHAGOGASTRODUODENOSCOPY  09/10/2012   Procedure: ESOPHAGOGASTRODUODENOSCOPY (EGD);  Surgeon: Rogene Houston, MD;  [facility];  Service: Endoscopy;  Laterality: N/A;  730  . EXCISION/RELEASE BURSA HIP Left 02/04/2016   Procedure: LEFT HIP TROCHANTERIC BURSECTOMY;  Surgeon: Mcarthur Rossetti, MD;  Location: [facility] [facility];  Service: Orthopedics;  Laterality: Left;  . I&D EXTREMITY  02/25/2012   Procedure: IRRIGATION AND DEBRIDEMENT EXTREMITY;  Surgeon: Roseanne Kaufman, MD;  Location: [place];  Service: Orthopedics;  Laterality: Right;  Irrigation and Debridement and Repair Right Thumb Nerve Laceration and Assoiated Structures   . KNEE DEBRIDEMENT  right  . LAPAROTOMY    . LEFT HEART CATHETERIZATION WITH CORONARY ANGIOGRAM N/A 01/16/2013   Procedure: LEFT HEART CATHETERIZATION WITH CORONARY ANGIOGRAM;  Surgeon: Jolaine Artist, MD;  Location: [facility] CATH [facility];  Service: Cardiovascular;  Laterality: N/A;  . STAPEDECTOMY     right  . TONSILLECTOMY AND ADENOIDECTOMY    . TRIGGER FINGER RELEASE  08/19/2011   Procedure: RELEASE TRIGGER FINGER/A-1 PULLEY;  Surgeon: Willa Frater III;  Location: [place];  Service: Orthopedics;  Laterality: Right;  right middle finger a-1 pulley release with tenosynovectomy  . TROCHANTERIC BURSA EXCISION Right 02/04/2016  . TUBAL LIGATION    . TYMPANOPLASTY      Family History  Problem Relation Age  of Onset  . Cancer Mother        Deceased with lung CA  . Heart failure Mother   . Heart disease Mother   . COPD Mother   . Varicose Veins Mother   . Depression Mother   . Cancer Father        Deceased with lung CA  . Heart disease Father   . Varicose Veins Father   . Depression Father   . Depression Brother   . ADD / ADHD Son     SOCIAL HISTORY: Social History   Socioeconomic History  . Marital status: Married    Spouse name: Not on file  . Number of children: 2  . Years of education: 83  . Highest education level: Not on file  Occupational History  . Not on file  Social Needs  . Financial resource strain: Not on file  . Food insecurity:    Worry: Not on file    Inability: Not on file  . Transportation needs:    Medical: Not on file    Non-medical: Not on file  Tobacco Use  . Smoking status: Never Smoker  . Smokeless tobacco: Never Used  Substance and Sexual Activity  . Alcohol use: No    Alcohol/week: 0.0 oz  . Drug use: No  . Sexual activity: Not Currently    Birth control/protection: Surgical  Lifestyle  . Physical activity:    Days per week: Not on file    Minutes per session: Not on file  . Stress: Not on file  Relationships  . Social connections:    Talks on phone: Not on file    Gets together: Not on file    Attends religious service: Not on file    Active member of club or organization: Not on file    Attends meetings of clubs or organizations: Not on file    Relationship status: Not on file  . Intimate partner violence:    Fear of current or ex partner: Not on file    Emotionally abused: Not on file    Physically abused: Not on file    Forced sexual activity: Not on file  Other Topics Concern  . Not on file  Social History Narrative   Lives in [place] with husband.     Grown children.     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   Caffeine use: none    Allergies  Allergen Reactions  . Dilaudid [Hydromorphone  Hcl] Other (See Comments)    Resp. arrest  . Codeine Nausea And Vomiting and Rash  . Morphine And Related Nausea And Vomiting    Current Outpatient Medications  Medication Sig Dispense Refill  . acetaminophen (TYLENOL) 325 MG  tablet Take 650 mg by mouth every 6 (six) hours as needed for mild pain.    . Ascorbic Acid (VITAMIN C PO) Take 2 tablets by mouth daily.    Marland Kitchen aspirin 81 MG chewable tablet aspirin 81 mg chewable tablet  Chew 1 tablet every day by oral route.    Marland Kitchen atorvastatin (LIPITOR) 40 MG tablet atorvastatin 40 mg tablet    . atorvastatin (LIPITOR) 40 MG tablet TAKE 1 TABLET BY MOUTH DAILY. 90 tablet 1  . Biotin (BIOTIN 5000) 5 MG CAPS Take 1 capsule by mouth daily.    . Calcium Citrate-Vitamin D (CALCITRATE/VITAMIN D PO) Take 1 tablet by mouth daily.    . carisoprodol (SOMA) 350 MG tablet Take 350 mg by mouth as needed.     . celecoxib (CELEBREX) 200 MG capsule celecoxib 200 mg capsule    . docusate sodium (COLACE) 100 MG capsule Take 100 mg by mouth daily as needed for mild constipation.    . DULoxetine (CYMBALTA) 60 MG capsule Take 2 capsules (120 mg total) by mouth daily. 120 capsule 0  . gabapentin (NEURONTIN) 100 MG capsule Take 2 capsules (200 mg total) by mouth 3 (three) times daily. 180 capsule 5  . indomethacin (INDOCIN) 25 MG capsule indomethacin 25 mg capsule prn    . meloxicam (MOBIC) 15 MG tablet meloxicam 15 mg tablet prn    . metoprolol succinate (TOPROL-XL) 25 MG 24 hr tablet metoprolol succinate ER 25 mg tablet,extended release 24 hr prn    . mirtazapine (REMERON) 30 MG tablet Take 1 tablet (30 mg total) by mouth at bedtime. 90 tablet 0  . nitroGLYCERIN (NITROSTAT) 0.4 MG SL tablet PLACE 1 TABLET UNDER THE TONGUE EVERY 5 MINUTES AS NEEDED FOR CHEST PAIN 100 tablet 1  . promethazine (PHENERGAN) 25 MG tablet Take 25 mg by mouth every 6 (six) hours as needed for nausea or vomiting.    . traZODone (DESYREL) 50 MG tablet Take 1 tablet (50 mg total) by mouth at bedtime  as needed for sleep. 90 tablet 0   No current facility-administered medications for this visit.     ROS:   General:  No weight loss, Fever, chills  HEENT: No recent headaches, no nasal bleeding, no visual changes, no sore throat  Neurologic: + dizziness, blackouts, seizures. No recent symptoms of stroke or mini- stroke. No recent episodes of slurred speech, or temporary blindness.  Cardiac: No recent episodes of chest pain/pressure, no shortness of breath at rest.  No shortness of breath with exertion.  Denies history of atrial fibrillation or irregular heartbeat  Vascular: No history of rest pain in feet.  No history of claudication.  No history of non-healing ulcer, No history of DVT   Pulmonary: No home oxygen, no productive cough, no hemoptysis,  No asthma or wheezing  Musculoskeletal:  [X]  Arthritis, [X]  Low back pain,  [X]  Joint pain  Hematologic:No history of hypercoagulable state.  No history of easy bleeding.  No history of anemia  Gastrointestinal: No hematochezia or melena,  No gastroesophageal reflux, no trouble swallowing  Urinary: [ ]  chronic Kidney disease, [ ]  on HD - [ ]  MWF or [ ]  TTHS, [ ]  Burning with urination, [ ]  Frequent urination, [ ]  Difficulty urinating;   Skin: No rashes  Psychological: No history of anxiety,  No history of depression   Physical Examination  Vitals:   01/11/18 1332  BP: 114/69  Pulse: 77  Resp: 16  Temp: 97.7 F (36.5 C)  TempSrc:  Oral  SpO2: 96%  Weight: 153 lb (69.4 kg)  Height: 5' 7.5" (1.715 m)    Body mass index is 23.61 kg/m.  General:  Alert and oriented, no acute distress HEENT: Normal Neck: No bruit or JVD Pulmonary: Clear to auscultation bilaterally Cardiac: Regular Rate and Rhythm without murmur Skin: No rash, scattered spider type varicosities in the foot ankle and anterior thighs bilaterally.  Her saphenous vein is not palpable.  She complains primarily of a reticular vein in the left posterior knee which  is about 2 mm in diameter extending over about 4 to 5 cm. Extremity Pulses:  2+ radial, brachial, femoral, dorsalis pedis, posterior tibial pulses bilaterally Musculoskeletal: No deformity trace edema  Neurologic: Upper and lower extremity motor 5/5 and symmetric  DATA:  Patient had a venous duplex exam today.  This showed some evidence of common femoral reflux bilaterally.  However her superficial system was intact with a 4 mm vein diameter but no reflux.  ASSESSMENT: Symptomatic varicose veins with pain and swelling although no significant superficial reflux.  Mild deep reflux.  Primarily spider and reticular type varicosities.   PLAN: Discussed with the patient that primary therapy is going to be compression therapy for her leg swelling symptoms.  I again discussed with her the possibility of sclerotherapy for her spider and reticular veins and she is not really interested in this currently.  She will follow-up with [place] on an as-needed basis.   Ruta Hinds, MD [facility] Office: [phone] Pager: [phone]

## 2018-01-13 DIAGNOSIS — M533 Sacrococcygeal disorders, not elsewhere classified: Secondary | ICD-10-CM | POA: Diagnosis not present

## 2018-01-15 ENCOUNTER — Encounter (HOSPITAL_COMMUNITY): Payer: Self-pay

## 2018-01-18 ENCOUNTER — Encounter (HOSPITAL_COMMUNITY): Payer: Self-pay | Admitting: Physical Therapy

## 2018-01-18 ENCOUNTER — Ambulatory Visit (INDEPENDENT_AMBULATORY_CARE_PROVIDER_SITE_OTHER): Payer: Medicare Other | Admitting: Psychiatry

## 2018-01-18 ENCOUNTER — Encounter (HOSPITAL_COMMUNITY): Payer: Self-pay | Admitting: Psychiatry

## 2018-01-18 DIAGNOSIS — F331 Major depressive disorder, recurrent, moderate: Secondary | ICD-10-CM | POA: Diagnosis not present

## 2018-01-18 DIAGNOSIS — Z62811 Personal history of psychological abuse in childhood: Secondary | ICD-10-CM | POA: Diagnosis not present

## 2018-01-18 DIAGNOSIS — Z91410 Personal history of adult physical and sexual abuse: Secondary | ICD-10-CM

## 2018-01-18 DIAGNOSIS — Z62810 Personal history of physical and sexual abuse in childhood: Secondary | ICD-10-CM
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
## 2018-01-22 ENCOUNTER — Ambulatory Visit (HOSPITAL_COMMUNITY): Payer: Self-pay

## 2018-01-22 DIAGNOSIS — R251 Tremor, unspecified: Secondary | ICD-10-CM | POA: Diagnosis not present

## 2018-01-22 DIAGNOSIS — F39 Unspecified mood [affective] disorder: Secondary | ICD-10-CM | POA: Diagnosis not present

## 2018-01-22 DIAGNOSIS — Z6824 Body mass index (BMI) 24.0-24.9, adult: Secondary | ICD-10-CM | POA: Diagnosis not present

## 2018-01-22 DIAGNOSIS — Z820 Family history of epilepsy and other diseases of the nervous system: Secondary | ICD-10-CM | POA: Diagnosis not present

## 2018-01-22 DIAGNOSIS — M5136 Other intervertebral disc degeneration, lumbar region: Secondary | ICD-10-CM | POA: Diagnosis not present

## 2018-01-22 DIAGNOSIS — M25559 Pain in unspecified hip: Secondary | ICD-10-CM | POA: Diagnosis not present

## 2018-01-22 DIAGNOSIS — J060 Acute laryngopharyngitis: Secondary | ICD-10-CM | POA: Diagnosis not present

## 2018-01-22 DIAGNOSIS — R05 Cough: Secondary | ICD-10-CM | POA: Diagnosis not present

## 2018-01-22 DIAGNOSIS — M6530 Trigger finger, unspecified finger: Secondary | ICD-10-CM | POA: Diagnosis not present

## 2018-01-22 DIAGNOSIS — F429 Obsessive-compulsive disorder, unspecified: Secondary | ICD-10-CM | POA: Diagnosis not present

## 2018-01-22 DIAGNOSIS — F438 Other reactions to severe stress: Secondary | ICD-10-CM | POA: Diagnosis not present

## 2018-01-22 DIAGNOSIS — R7301 Impaired fasting glucose: Secondary | ICD-10-CM | POA: Diagnosis not present

## 2018-01-22 MED FILL — VENTOLIN HFA 90 MCG INHALER: 108 (90 BAS | 25 days supply | Qty: 18 | Fill #0

## 2018-01-22 MED FILL — BENZONATATE 200 MG CAPSULE: 200 | 10 days supply | Qty: 20 | Fill #0

## 2018-01-22 MED FILL — HYDROCODONE-HOMATROPINE SYR: 5-1.5 | 6 days supply | Qty: 120 | Fill #0

## 2018-01-25 ENCOUNTER — Encounter (HOSPITAL_COMMUNITY): Payer: Self-pay

## 2018-01-26 DIAGNOSIS — M5136 Other intervertebral disc degeneration, lumbar region: Secondary | ICD-10-CM | POA: Diagnosis not present

## 2018-01-29 ENCOUNTER — Encounter (HOSPITAL_COMMUNITY): Payer: Self-pay

## 2018-01-31 ENCOUNTER — Encounter (HOSPITAL_COMMUNITY): Payer: Self-pay | Admitting: Psychiatry

## 2018-01-31 ENCOUNTER — Encounter (HOSPITAL_COMMUNITY): Payer: Self-pay

## 2018-01-31 ENCOUNTER — Ambulatory Visit (INDEPENDENT_AMBULATORY_CARE_PROVIDER_SITE_OTHER): Payer: Medicare Other | Admitting: Psychiatry

## 2018-01-31 DIAGNOSIS — F331 Major depressive disorder, recurrent, moderate: Secondary | ICD-10-CM | POA: Diagnosis not present
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
## 2018-02-05 ENCOUNTER — Encounter (HOSPITAL_COMMUNITY): Payer: Self-pay

## 2018-02-07 ENCOUNTER — Encounter (HOSPITAL_COMMUNITY): Payer: Self-pay

## 2018-02-09 ENCOUNTER — Ambulatory Visit: Payer: BLUE CROSS/BLUE SHIELD | Admitting: Cardiovascular Disease

## 2018-02-13 ENCOUNTER — Encounter (HOSPITAL_COMMUNITY): Payer: Self-pay

## 2018-02-14 ENCOUNTER — Ambulatory Visit (INDEPENDENT_AMBULATORY_CARE_PROVIDER_SITE_OTHER): Payer: Medicare Other | Admitting: Psychiatry

## 2018-02-14 ENCOUNTER — Encounter (HOSPITAL_COMMUNITY): Payer: Self-pay | Admitting: Psychiatry

## 2018-02-14 DIAGNOSIS — F331 Major depressive disorder, recurrent, moderate: Secondary | ICD-10-CM
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
## 2018-02-15 ENCOUNTER — Encounter (HOSPITAL_COMMUNITY): Payer: Self-pay
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
## 2018-02-19 ENCOUNTER — Ambulatory Visit (INDEPENDENT_AMBULATORY_CARE_PROVIDER_SITE_OTHER): Payer: Medicare Other | Admitting: Psychiatry

## 2018-02-19 VITALS — BP 114/69 | HR 82 | Ht 67.5 in | Wt 156.0 lb

## 2018-02-19 DIAGNOSIS — F331 Major depressive disorder, recurrent, moderate: Secondary | ICD-10-CM | POA: Diagnosis not present

## 2018-02-19 DIAGNOSIS — G4700 Insomnia, unspecified: Secondary | ICD-10-CM | POA: Diagnosis not present

## 2018-02-19 DIAGNOSIS — Z79899 Other long term (current) drug therapy: Secondary | ICD-10-CM | POA: Diagnosis not present

## 2018-02-19 DIAGNOSIS — Z818 Family history of other mental and behavioral disorders: Secondary | ICD-10-CM | POA: Diagnosis not present

## 2018-02-19 DIAGNOSIS — R450 Nervousness: Secondary | ICD-10-CM

## 2018-02-19 MED ORDER — DULOXETINE HCL 60 MG PO CPEP: 120.0000 mg | ORAL_CAPSULE | Freq: Every day | ORAL | 0 refills | Status: DC

## 2018-02-19 MED ORDER — TRAZODONE HCL 50 MG PO TABS: 50.0000 mg | ORAL_TABLET | Freq: Every evening | ORAL | 0 refills | Status: DC | PRN

## 2018-02-19 MED ORDER — MIRTAZAPINE 30 MG PO TABS: 30.0000 mg | ORAL_TABLET | Freq: Every day | ORAL | 0 refills | Status: DC

## 2018-02-19 MED FILL — traZODone HCL 50 MG TABS: 50 | 90 days supply | Qty: 90 | Fill #0

## 2018-02-19 MED FILL — MIRTAZAPINE 30 MG TABLET: 30 | 90 days supply | Qty: 90 | Fill #0

## 2018-02-19 MED FILL — DULoxetine HCL 60 MG CPEP: 60 | 60 days supply | Qty: 120 | Fill #0

## 2018-02-19 NOTE — Patient Instructions (Signed)
1.Continue duloxetine 120 mg daily 2. Continue mirtazapine 30 mg at night  3. Continue Trazodone 50 mg at night as needed for sleep 4. Return to clinic in three months for 15 mins

## 2018-02-20 ENCOUNTER — Ambulatory Visit (HOSPITAL_COMMUNITY): Payer: Self-pay

## 2018-02-28 MED FILL — ATORVASTATIN 40 MG TABLET: 40 | 90 days supply | Qty: 90 | Fill #0

## 2018-03-01 ENCOUNTER — Ambulatory Visit (INDEPENDENT_AMBULATORY_CARE_PROVIDER_SITE_OTHER): Payer: Medicare Other | Admitting: Cardiovascular Disease

## 2018-03-01 ENCOUNTER — Encounter: Payer: Self-pay | Admitting: Cardiovascular Disease

## 2018-03-01 ENCOUNTER — Encounter: Payer: Self-pay | Admitting: *Deleted

## 2018-03-01 ENCOUNTER — Encounter (HOSPITAL_COMMUNITY): Payer: Self-pay | Admitting: Physical Therapy

## 2018-03-01 VITALS — BP 114/76 | HR 70 | Ht 67.5 in | Wt 159.0 lb

## 2018-03-01 DIAGNOSIS — E785 Hyperlipidemia, unspecified: Secondary | ICD-10-CM | POA: Diagnosis not present

## 2018-03-01 DIAGNOSIS — I25118 Atherosclerotic heart disease of native coronary artery with other forms of angina pectoris: Secondary | ICD-10-CM

## 2018-03-01 DIAGNOSIS — I83893 Varicose veins of bilateral lower extremities with other complications: Secondary | ICD-10-CM | POA: Diagnosis not present

## 2018-03-01 NOTE — Patient Instructions (Signed)
Medication Instructions:  Your physician recommends that you continue on your current medications as directed. Please refer to the Current Medication list given to you today.   Labwork: NONE   Testing/Procedures: NONE   Follow-Up: Your physician wants you to follow-up in: 6 Months. You will receive a reminder letter in the mail two months in advance. If you don't receive a letter, please call our office to schedule the follow-up appointment.   Any Other Special Instructions Will Be Listed Below (If Applicable).     If you need a refill on your cardiac medications before your next appointment, please call your pharmacy.  Thank you for choosing [facility]!   

## 2018-03-01 NOTE — Progress Notes (Signed)
SUBJECTIVE: The patient presents for routine follow-up.  She has a long history of symptomatic varicose veins with pain and swelling with no significant superficial reflux.  She has mild deep reflux.  She has seen vascular surgery and was told the primary therapy is compression stockings Dr. Oneida Alar discussed the possibility of sclerotherapy but she was not interested.  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  Most recent nuclear stress test was performed on 06/04/2013 and was low risk.  Demonstrated old small inferolateral scar with no evidence of ischemia and normal left ventricular systolic function.  She has had a difficult year as both her 66 year old brother and 48 year old sister passed away within a few months of each other.  She is the only surviving child of her parents.  She has been bothered by left hip pain.  She recently received injection to the left sacroiliac joint and needs to undergo surgery.  Because several family members have Parkinson's, she has been monitor for this.  She is bothered by leg and feet cramps and drinks 4-6 bottles of water daily.  She enjoys gardening.  She has episodic exertional chest pains which she attributes to stress and anxiety.  ECG performed in the office today which I ordered and personally interpreted demonstrates normal sinus rhythm with no ischemic ST segment or T-wave abnormalities, nor any arrhythmias.     Review of Systems: As per "subjective", otherwise negative.  Allergies  Allergen Reactions  . Dilaudid [Hydromorphone Hcl] Other (See Comments)    Resp. arrest  . Codeine Nausea And Vomiting and Rash  . Morphine And Related Nausea And Vomiting    Current Outpatient Medications  Medication Sig Dispense Refill  . acetaminophen (TYLENOL) 325 MG tablet Take  650 mg by mouth every 6 (six) hours as needed for mild pain.    . Ascorbic Acid (VITAMIN C PO) Take 2 tablets by mouth daily.    Marland Kitchen aspirin 81 MG chewable tablet aspirin 81 mg chewable tablet  Chew 1 tablet every day by oral route.    Marland Kitchen atorvastatin (LIPITOR) 40 MG tablet atorvastatin 40 mg tablet    . atorvastatin (LIPITOR) 40 MG tablet TAKE 1 TABLET BY MOUTH DAILY. 90 tablet 1  . Biotin (BIOTIN 5000) 5 MG CAPS Take 1 capsule by mouth daily.    . Calcium Citrate-Vitamin D (CALCITRATE/VITAMIN D PO) Take 1 tablet by mouth daily.    . carisoprodol (SOMA) 350 MG tablet Take 350 mg by mouth as needed.     . celecoxib (CELEBREX) 200 MG capsule celecoxib 200 mg capsule    . docusate sodium (COLACE) 100 MG capsule Take 100 mg by mouth daily as needed for mild constipation.    . DULoxetine (CYMBALTA) 60 MG capsule Take 2 capsules (120 mg total) by mouth daily. 120 capsule 0  . gabapentin (NEURONTIN) 100 MG capsule Take 2 capsules (200 mg total) by mouth 3 (three) times daily. 180 capsule 5  . indomethacin (INDOCIN) 25 MG capsule indomethacin 25 mg capsule prn    . meloxicam (MOBIC) 15 MG tablet meloxicam 15 mg tablet prn    . metoprolol succinate (TOPROL-XL) 25 MG 24 hr tablet metoprolol succinate ER 25 mg tablet,extended release 24 hr prn    . mirtazapine (REMERON) 30 MG tablet Take 1 tablet (30 mg total) by mouth at bedtime. 90 tablet 0  .  nitroGLYCERIN (NITROSTAT) 0.4 MG SL tablet PLACE 1 TABLET UNDER THE TONGUE EVERY 5 MINUTES AS NEEDED FOR CHEST PAIN 100 tablet 1  . promethazine (PHENERGAN) 25 MG tablet Take 25 mg by mouth every 6 (six) hours as needed for nausea or vomiting.    . traZODone (DESYREL) 50 MG tablet Take 1 tablet (50 mg total) by mouth at bedtime as needed for sleep. 90 tablet 0   No current facility-administered medications for this visit.     Past Medical History:  Diagnosis Date  . Anemia    hx  . Anxiety   . CAD (coronary artery disease)    a. 12/2012 NSTEMI/Cath: LM  anomalous, arising in R cusp ant to RCA, otw nl, LAD 108m with bridge, RI nl, LCX nl, OM2 small, subtl occl w/ thrombus distally - felt to be spont dissection, too small for PCI->Med Rx, RCA large, dom, nl, PDA/PD nl, EF 55% w/ focal HK in mid-dist antlat wall;  b. 05/2013 Lexi CL: EF 77%, old small inferolat scar, no ischemia.  . Common migraine with intractable migraine 06/16/2017  . Depression   . Fibromyalgia   . GERD (gastroesophageal reflux disease)    hasn't started taking any meds  . History of blood transfusion    23yrs ago  . Hx of colonic polyps   . IBS (irritable bowel syndrome)    constipation  . Joint pain   . Joint swelling   . Kidney stones 03/2010  . Migraine    "q other week" (02/04/2016)  . Myocardial infarction ([place])   . Numbness    right leg  . Polycythemia   . PONV (postoperative nausea and vomiting)   . Raynaud's disease   . Scoliosis   . Shortness of breath dyspnea    with exertion    Past Surgical History:  Procedure Laterality Date  . ABDOMINAL HYSTERECTOMY     partial  . APPENDECTOMY     with explor. lap.  Marland Kitchen CARDIAC CATHETERIZATION    . CHOLECYSTECTOMY  10/28/2002   lap.  . COLONOSCOPY    . DEBRIDEMENT TENNIS ELBOW    . DIAGNOSTIC LAPAROSCOPY    . DILATION AND CURETTAGE OF UTERUS    . ELBOW SURGERY     golfer's elbow-bilateral  . ESOPHAGOGASTRODUODENOSCOPY  09/10/2012   Procedure: ESOPHAGOGASTRODUODENOSCOPY (EGD);  Surgeon: Rogene Houston, MD;  Location: [facility];  Service: Endoscopy;  Laterality: N/A;  730  . EXCISION/RELEASE BURSA HIP Left 02/04/2016   Procedure: LEFT HIP TROCHANTERIC BURSECTOMY;  Surgeon: Mcarthur Rossetti, MD;  Location: [facility];  Service: Orthopedics;  Laterality: Left;  . I&D EXTREMITY  02/25/2012   Procedure: IRRIGATION AND DEBRIDEMENT EXTREMITY;  Surgeon: Roseanne Kaufman, MD;  Location: [facility];  Service: Orthopedics;  Laterality: Right;  Irrigation and Debridement and Repair Right Thumb Nerve Laceration and Assoiated  Structures   . KNEE DEBRIDEMENT     right  . LAPAROTOMY    . LEFT HEART CATHETERIZATION WITH CORONARY ANGIOGRAM N/A 01/16/2013   Procedure: LEFT HEART CATHETERIZATION WITH CORONARY ANGIOGRAM;  Surgeon: Jolaine Artist, MD;  Location: [facility] CATH LAB;  Service: Cardiovascular;  Laterality: N/A;  . STAPEDECTOMY     right  . TONSILLECTOMY AND ADENOIDECTOMY    . TRIGGER FINGER RELEASE  08/19/2011   Procedure: RELEASE TRIGGER FINGER/A-1 PULLEY;  Surgeon: Willa Frater III;  Location: [facility];  Service: Orthopedics;  Laterality: Right;  right middle finger a-1 pulley release with tenosynovectomy  . TROCHANTERIC BURSA  EXCISION Right 02/04/2016  . TUBAL LIGATION    . TYMPANOPLASTY      Social History   Socioeconomic History  . Marital status: Married    Spouse name: Not on file  . Number of children: 2  . Years of education: 42  . Highest education level: Not on file  Occupational History  . Not on file  Social Needs  . Financial resource strain: Not on file  . Food insecurity:    Worry: Not on file    Inability: Not on file  . Transportation needs:    Medical: Not on file    Non-medical: Not on file  Tobacco Use  . Smoking status: Never Smoker  . Smokeless tobacco: Never Used  Substance and Sexual Activity  . Alcohol use: No    Alcohol/week: 0.0 oz  . Drug use: No  . Sexual activity: Not Currently    Birth control/protection: Surgical  Lifestyle  . Physical activity:    Days per week: Not on file    Minutes per session: Not on file  . Stress: Not on file  Relationships  . Social connections:    Talks on phone: Not on file    Gets together: Not on file    Attends religious service: Not on file    Active member of club or organization: Not on file    Attends meetings of clubs or organizations: Not on file    Relationship status: Not on file  . Intimate partner violence:    Fear of current or ex partner: Not on file    Emotionally abused: Not on file      Physically abused: Not on file    Forced sexual activity: Not on file  Other Topics Concern  . Not on file  Social History Narrative   Lives in [place] with husband.     Grown children.     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   Caffeine use: none     Vitals:   03/01/18 1329  BP: 114/76  Pulse: 70  SpO2: 98%  Weight: 159 lb (72.1 kg)  Height: 5' 7.5" (1.715 m)    Wt Readings from Last 3 Encounters:  03/01/18 159 lb (72.1 kg)  01/11/18 153 lb (69.4 kg)  12/14/17 157 lb 3.2 oz (71.3 kg)     PHYSICAL EXAM General: NAD HEENT: Normal. Neck: No JVD, no thyromegaly. Lungs: Clear to auscultation bilaterally with normal respiratory effort. CV: Regular rate and rhythm, normal S1/S2, no S3/S4, no murmur. No pretibial or periankle edema.  No carotid bruit.   Abdomen: Soft, nontender, no distention.  Neurologic: Alert and oriented.  Psych: Normal affect. Skin: Normal. Musculoskeletal: No gross deformities.    ECG: Most recent ECG reviewed.   Labs: Lab Results  Component Value Date/Time   K 4.4 02/01/2016 10:53 AM   BUN 10 02/01/2016 10:53 AM   CREATININE 0.86 02/01/2016 10:53 AM   CREATININE 0.79 07/16/2014 05:07 PM   ALT 16 06/17/2015 02:08 PM   TSH 3.061 10/20/2014 09:05 AM   HGB 14.9 02/01/2016 10:53 AM     Lipids: Lab Results  Component Value Date/Time   LDLCALC 52 10/20/2014 09:05 AM   CHOL 140 10/20/2014 09:05 AM   TRIG 76 10/20/2014 09:05 AM   HDL 73 10/20/2014 09:05 AM       ASSESSMENT AND PLAN:  1. CAD: Symptomatically stable.  No changes to therapy.  Continue aspirin, Lipitor, and Toprol-XL.  2. Venous varicosities: Continues compression stocking use, 30-40 mmHg.  3. Hyperlipidemia: Continue Lipitor 40 mg.  I will obtain a copy of lipids from PCP.     Disposition: Follow up 6 months   Kate Sable, M.D., F.A.C.C.

## 2018-03-01 NOTE — Therapy (Signed)
[place], [name], [address] Phone: [phone]   Fax:  [phone]  Patient Details  Name: Stacey Lawrence MRN: [id number] Date of Birth: 22-Jul-1951 Referring Provider:  No ref. provider found  Encounter Date: 03/01/2018  PHYSICAL THERAPY DISCHARGE SUMMARY  Visits from Start of Care: 6  Current functional level related to goals / functional outcomes: Unknown as patient did not return for re-assessment. See evaluation on 12/31/17.    Remaining deficits: Unknown as patient did not return for re-assessment. See evaluation on 12/31/17.    Education / Equipment: Patient was educated on purpose and technique of exercises throughout session.  Plan: Patient agrees to discharge.  Patient goals were not met. Patient is being discharged due to not returning since the last visit.  ?????         Clarene Critchley PT, DPT 1:41 PM, 03/01/18 [place], [name], [address] Phone: [phone]   Fax:  [phone]

## 2018-03-06 ENCOUNTER — Ambulatory Visit (INDEPENDENT_AMBULATORY_CARE_PROVIDER_SITE_OTHER): Payer: Medicare Other | Admitting: Psychiatry

## 2018-03-06 DIAGNOSIS — F331 Major depressive disorder, recurrent, moderate: Secondary | ICD-10-CM | POA: Diagnosis not present
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
## 2018-03-09 ENCOUNTER — Telehealth (HOSPITAL_COMMUNITY): Payer: Self-pay

## 2018-03-09 NOTE — Telephone Encounter (Signed)
Stacey Lawrence - infromed them to contackt Medical records at [phone] or Fax [phone].

## 2018-03-20 ENCOUNTER — Ambulatory Visit (INDEPENDENT_AMBULATORY_CARE_PROVIDER_SITE_OTHER): Payer: Medicare Other | Admitting: Psychiatry

## 2018-03-20 ENCOUNTER — Encounter (HOSPITAL_COMMUNITY): Payer: Self-pay | Admitting: Psychiatry

## 2018-03-20 DIAGNOSIS — F331 Major depressive disorder, recurrent, moderate: Secondary | ICD-10-CM
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
## 2018-04-04 DIAGNOSIS — M533 Sacrococcygeal disorders, not elsewhere classified: Secondary | ICD-10-CM | POA: Diagnosis not present

## 2018-04-04 MED FILL — CARISOPRODOL 350 MG TABS: 350 | 20 days supply | Qty: 60 | Fill #0

## 2018-04-05 ENCOUNTER — Ambulatory Visit (INDEPENDENT_AMBULATORY_CARE_PROVIDER_SITE_OTHER): Payer: Medicare Other | Admitting: Psychiatry

## 2018-04-05 DIAGNOSIS — F331 Major depressive disorder, recurrent, moderate: Secondary | ICD-10-CM
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
## 2018-04-19 ENCOUNTER — Ambulatory Visit (INDEPENDENT_AMBULATORY_CARE_PROVIDER_SITE_OTHER): Payer: Medicare Other | Admitting: Psychiatry

## 2018-04-19 DIAGNOSIS — F331 Major depressive disorder, recurrent, moderate: Secondary | ICD-10-CM | POA: Diagnosis not present
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
## 2018-04-20 DIAGNOSIS — M722 Plantar fascial fibromatosis: Secondary | ICD-10-CM | POA: Diagnosis not present

## 2018-04-20 DIAGNOSIS — M79672 Pain in left foot: Secondary | ICD-10-CM | POA: Diagnosis not present

## 2018-04-20 DIAGNOSIS — M79671 Pain in right foot: Secondary | ICD-10-CM | POA: Diagnosis not present

## 2018-04-25 DIAGNOSIS — R110 Nausea: Secondary | ICD-10-CM | POA: Diagnosis not present

## 2018-04-25 DIAGNOSIS — Z6823 Body mass index (BMI) 23.0-23.9, adult: Secondary | ICD-10-CM | POA: Diagnosis not present

## 2018-04-25 DIAGNOSIS — G43019 Migraine without aura, intractable, without status migrainosus: Secondary | ICD-10-CM | POA: Diagnosis not present

## 2018-04-25 DIAGNOSIS — H6500 Acute serous otitis media, unspecified ear: Secondary | ICD-10-CM | POA: Diagnosis not present

## 2018-04-25 MED FILL — AMOXICILLIN 500 MG CAPSULE: 500 | 10 days supply | Qty: 20 | Fill #0

## 2018-04-25 MED FILL — PROMETHAZINE 25 MG TABLET: 25 | 8 days supply | Qty: 30 | Fill #0

## 2018-05-03 ENCOUNTER — Ambulatory Visit (INDEPENDENT_AMBULATORY_CARE_PROVIDER_SITE_OTHER): Payer: Medicare Other | Admitting: Psychiatry

## 2018-05-03 DIAGNOSIS — F331 Major depressive disorder, recurrent, moderate: Secondary | ICD-10-CM | POA: Diagnosis not present
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
## 2018-05-17 ENCOUNTER — Encounter (HOSPITAL_COMMUNITY): Payer: Self-pay | Admitting: Psychiatry

## 2018-05-17 ENCOUNTER — Ambulatory Visit (INDEPENDENT_AMBULATORY_CARE_PROVIDER_SITE_OTHER): Payer: Medicare Other | Admitting: Psychiatry

## 2018-05-17 DIAGNOSIS — F331 Major depressive disorder, recurrent, moderate: Secondary | ICD-10-CM
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
## 2018-05-24 ENCOUNTER — Ambulatory Visit (INDEPENDENT_AMBULATORY_CARE_PROVIDER_SITE_OTHER): Payer: Medicare Other | Admitting: Otolaryngology

## 2018-05-24 DIAGNOSIS — H9313 Tinnitus, bilateral: Secondary | ICD-10-CM

## 2018-05-24 DIAGNOSIS — H9201 Otalgia, right ear: Secondary | ICD-10-CM

## 2018-05-24 DIAGNOSIS — H903 Sensorineural hearing loss, bilateral: Secondary | ICD-10-CM

## 2018-05-24 DIAGNOSIS — R42 Dizziness and giddiness: Secondary | ICD-10-CM | POA: Diagnosis not present

## 2018-05-30 ENCOUNTER — Ambulatory Visit (INDEPENDENT_AMBULATORY_CARE_PROVIDER_SITE_OTHER): Payer: Medicare Other | Admitting: Psychiatry

## 2018-05-30 DIAGNOSIS — F331 Major depressive disorder, recurrent, moderate: Secondary | ICD-10-CM | POA: Diagnosis not present
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
## 2018-05-31 DIAGNOSIS — F438 Other reactions to severe stress: Secondary | ICD-10-CM | POA: Diagnosis not present

## 2018-05-31 DIAGNOSIS — F39 Unspecified mood [affective] disorder: Secondary | ICD-10-CM | POA: Diagnosis not present

## 2018-05-31 DIAGNOSIS — M25559 Pain in unspecified hip: Secondary | ICD-10-CM | POA: Diagnosis not present

## 2018-05-31 DIAGNOSIS — R7301 Impaired fasting glucose: Secondary | ICD-10-CM | POA: Diagnosis not present

## 2018-05-31 DIAGNOSIS — Z6824 Body mass index (BMI) 24.0-24.9, adult: Secondary | ICD-10-CM | POA: Diagnosis not present

## 2018-05-31 DIAGNOSIS — F429 Obsessive-compulsive disorder, unspecified: Secondary | ICD-10-CM | POA: Diagnosis not present

## 2018-05-31 DIAGNOSIS — J060 Acute laryngopharyngitis: Secondary | ICD-10-CM | POA: Diagnosis not present

## 2018-05-31 DIAGNOSIS — M5136 Other intervertebral disc degeneration, lumbar region: Secondary | ICD-10-CM | POA: Diagnosis not present

## 2018-05-31 DIAGNOSIS — Z820 Family history of epilepsy and other diseases of the nervous system: Secondary | ICD-10-CM | POA: Diagnosis not present

## 2018-05-31 DIAGNOSIS — R251 Tremor, unspecified: Secondary | ICD-10-CM | POA: Diagnosis not present

## 2018-05-31 DIAGNOSIS — R05 Cough: Secondary | ICD-10-CM | POA: Diagnosis not present

## 2018-05-31 DIAGNOSIS — E559 Vitamin D deficiency, unspecified: Secondary | ICD-10-CM | POA: Diagnosis not present

## 2018-06-11 DIAGNOSIS — M533 Sacrococcygeal disorders, not elsewhere classified: Secondary | ICD-10-CM | POA: Diagnosis not present

## 2018-06-15 ENCOUNTER — Ambulatory Visit (HOSPITAL_COMMUNITY): Payer: Self-pay | Admitting: Psychiatry

## 2018-06-15 MED FILL — CELECOXIB 200 MG CAP: 200 | 30 days supply | Qty: 30 | Fill #2

## 2018-06-19 DIAGNOSIS — F39 Unspecified mood [affective] disorder: Secondary | ICD-10-CM | POA: Diagnosis not present

## 2018-06-19 DIAGNOSIS — R7301 Impaired fasting glucose: Secondary | ICD-10-CM | POA: Diagnosis not present

## 2018-06-19 DIAGNOSIS — Z6824 Body mass index (BMI) 24.0-24.9, adult: Secondary | ICD-10-CM | POA: Diagnosis not present

## 2018-06-19 DIAGNOSIS — K581 Irritable bowel syndrome with constipation: Secondary | ICD-10-CM | POA: Diagnosis not present

## 2018-06-19 DIAGNOSIS — R42 Dizziness and giddiness: Secondary | ICD-10-CM | POA: Diagnosis not present

## 2018-06-19 DIAGNOSIS — M461 Sacroiliitis, not elsewhere classified: Secondary | ICD-10-CM | POA: Diagnosis not present

## 2018-06-19 DIAGNOSIS — R251 Tremor, unspecified: Secondary | ICD-10-CM | POA: Diagnosis not present

## 2018-06-19 DIAGNOSIS — G43001 Migraine without aura, not intractable, with status migrainosus: Secondary | ICD-10-CM | POA: Diagnosis not present

## 2018-06-19 DIAGNOSIS — F5101 Primary insomnia: Secondary | ICD-10-CM | POA: Diagnosis not present

## 2018-06-22 DIAGNOSIS — M79671 Pain in right foot: Secondary | ICD-10-CM | POA: Diagnosis not present

## 2018-06-22 DIAGNOSIS — M722 Plantar fascial fibromatosis: Secondary | ICD-10-CM | POA: Diagnosis not present

## 2018-06-26 ENCOUNTER — Encounter: Payer: Self-pay | Admitting: Adult Health

## 2018-06-26 ENCOUNTER — Ambulatory Visit (INDEPENDENT_AMBULATORY_CARE_PROVIDER_SITE_OTHER): Payer: Medicare Other | Admitting: Adult Health

## 2018-06-26 VITALS — BP 107/65 | HR 68 | Ht 67.5 in | Wt 155.5 lb

## 2018-06-26 DIAGNOSIS — R251 Tremor, unspecified: Secondary | ICD-10-CM

## 2018-06-26 DIAGNOSIS — G43009 Migraine without aura, not intractable, without status migrainosus: Secondary | ICD-10-CM | POA: Diagnosis not present

## 2018-06-26 DIAGNOSIS — I25118 Atherosclerotic heart disease of native coronary artery with other forms of angina pectoris: Secondary | ICD-10-CM

## 2018-06-26 DIAGNOSIS — R42 Dizziness and giddiness: Secondary | ICD-10-CM

## 2018-06-26 MED FILL — CELECOXIB 200 MG CAP: 200 | 30 days supply | Qty: 30 | Fill #2

## 2018-06-26 NOTE — Patient Instructions (Addendum)
Your Plan:  Continue Gabapentin 100-200 mg three times a day If your symptoms worsen or you develop new symptoms please let us know.  Can consider Primidone for tremor  Thank you for coming to see [place] at [facility]. I hope we have been able to provide you high quality care today.  You may receive a patient satisfaction survey over the next few weeks. We would appreciate your feedback and comments so that we may continue to improve ourselves and the health of our patients.  Primidone tablets What is this medicine? PRIMIDONE (PRI mi done) is a barbiturate. This medicine is used to control seizures in certain types of epilepsy. It is not for use in absence (petit mal) seizures. This medicine may be used for other purposes; ask your health care provider or pharmacist if you have questions. COMMON BRAND NAME(S): Mysoline What should I tell my health care provider before I take this medicine? They need to know if you have any of these conditions: -kidney disease -liver disease -porphyria -suicidal thoughts, plans, or attempt; a previous suicide attempt by you or a family member -an unusual or allergic reaction to primidone, phenobarbital, other barbiturates or seizure medications, other medicines, foods, dyes, or preservatives -pregnant or trying to get pregnant -breast-feeding How should I use this medicine? Take this medicine by mouth with a glass of water. Follow the directions on the prescription label. Take your doses at regular intervals. Do not take your medicine more often than directed. Do not stop taking except on the advice of your doctor or health care professional. A special MedGuide will be given to you by the pharmacist with each prescription and refill. Be sure to read this information carefully each time. Contact your pediatrician or health care professional regarding the use of this medication in children. Special care may be needed. While this drug may be  prescribed for children for selected conditions, precautions do apply. Overdosage: If you think you have taken too much of this medicine contact a poison control center or emergency room at once. NOTE: This medicine is only for you. Do not share this medicine with others. What if I miss a dose? If you miss a dose, take it as soon as you can. If it is almost time for your next dose, take only that dose. Do not take double or extra doses. What may interact with this medicine? Do not take this medicine with any of the following medications: -voriconazole This medicine may also interact with the following medications: -cancer-treating medications -cyclosporine -disopyramide -doxycycline -female hormones, including contraceptive or birth control pills -medicines for mental depression, anxiety or other mood problems -medicines for treating HIV infection or AIDS -modafinil -prescription pain medications -quinidine -warfarin This list may not describe all possible interactions. Give your health care provider a list of all the medicines, herbs, non-prescription drugs, or dietary supplements you use. Also tell them if you smoke, drink alcohol, or use illegal drugs. Some items may interact with your medicine. What should I watch for while using this medicine? Visit your doctor or health care professional for regular checks on your progress. It may be 2 to 3 weeks before you see the full effects of this medicine. Do not suddenly stop taking this medicine, you may increase the risk of seizures. Your doctor or health care professional may want to gradually reduce the dose. Wear a medical identification bracelet or chain to say you have epilepsy, and carry a card that lists all your medications. You  may get drowsy or dizzy. Do not drive, use machinery, or do anything that needs mental alertness until you know how this medicine affects you. Do not stand or sit up quickly, especially if you are an older  patient. This reduces the risk of dizzy or fainting spells. Alcohol may interfere with the effect of this medicine. Avoid alcoholic drinks. Birth control pills may not work properly while you are taking this medicine. Talk to your doctor about using an extra method of birth control. The use of this medicine may increase the chance of suicidal thoughts or actions. Pay special attention to how you are responding while on this medicine. Any worsening of mood, or thoughts of suicide or dying should be reported to your health care professional right away. Women who become pregnant while using this medicine may enroll in the [place] Pregnancy Registry by calling [phone]. This registry collects information about the safety of antiepileptic drug use during pregnancy. What side effects may I notice from receiving this medicine? Side effects that you should report to your doctor or health care professional as soon as possible: -allergic reactions like skin rash, itching or hives, swelling of the face, lips, or tongue -blurred, double vision, or uncontrollable rolling or movements of the eyes -redness, blistering, peeling or loosening of the skin, including inside the mouth -shortness of breath or difficulty breathing -unusual excitement or restlessness, more likely in children and the elderly -unusually weak or tired -worsening of mood, thoughts or actions of suicide or dying Side effects that usually do not require medical attention (report to your doctor or health care professional if they continue or are bothersome): -clumsiness, unsteadiness, or a hang-over effect -decreased sexual ability -dizziness, drowsiness -loss of appetite -nausea or vomiting This list may not describe all possible side effects. Call your doctor for medical advice about side effects. You may report side effects to FDA at 1-800-FDA-1088. Where should I keep my medicine? Keep out of the reach of  children. This medicine may cause accidental overdose and death if it taken by other adults, children, or pets. Mix any unused medicine with a substance like cat litter or coffee grounds. Then throw the medicine away in a sealed container like a sealed bag or a coffee can with a lid. Do not use the medicine after the expiration date. Store at room temperature between 15 and 30 degrees C (59 and 86 degrees F). NOTE: This sheet is a summary. It may not cover all possible information. If you have questions about this medicine, talk to your doctor, pharmacist, or health care provider.  2018 Elsevier/Gold Standard (2013-11-01 15:40:08)

## 2018-06-26 NOTE — Progress Notes (Signed)
I have read the note, and I agree with the clinical assessment and plan.  Olar Santini K Andersen Iorio   

## 2018-06-26 NOTE — Progress Notes (Signed)
PATIENT: Stacey Lawrence DOB: 1951/03/29  REASON FOR VISIT: follow up HISTORY FROM: patient  HISTORY OF PRESENT ILLNESS: Today 06/26/18: [name] is a 67 year old female with a history of essential tremor.  She returns today for follow-up.  She currently taking gabapentin 100 mg 3 times a day.  The prescriptions is written that she can take 200 mg 3 times a day.  She states that she only takes 200 mg and she knows she is in a half to do something.  That the tremor will be exacerbated.  She states that they continue tablets does help her tremor.  She does feel that over time it has gotten worse.  She also reports that she is been having dizziness.  She has followed with ENT and was treated for an ear infection however the dizziness has continued.  She reports that her ENT did not feel that it was related to her ears.  The patient states that the dizziness only occurs with positional changes.  She states that she can be driving and look to the right or left and she will get a mild spinning sensation that resolved in several seconds.  She states that the same thing occurring that she bends over and stands up.  She denies any associated symptoms.  She reports that her headaches have been under relatively good control with gabapentin.  She states that the dizziness does not occur with her headaches.  She returns today for evaluation.   HISTORY 12/14/17: [name] is a 67 year old right-handed white female with history of an essential tremor. The patient indicates that she is having difficulty performing tasks that require fine motor control. She likes to paint and had to give up painting detailed projects. She also has had to give up sewing in part. The patient has tremors on both arms. She denies any vocal tremor or head or neck tremor. She also has had an increase in her migraine headaches that are now on average 2 times a week, oftentimes coming on at night and may respond to caffeinated  products. The patient has been on Cymbalta, the dose was recently increased one week ago. The patient claims that her maternal grandmother also had a similar essential tremor. The patient comes the office today for an evaluation.   REVIEW OF SYSTEMS: Out of a complete 14 system review of symptoms, the patient complains only of the following symptoms, and all other reviewed systems are negative.  See HPI  ALLERGIES: Allergies  Allergen Reactions  . Dilaudid [Hydromorphone Hcl] Other (See Comments)    Resp. arrest  . Codeine Nausea And Vomiting and Rash  . Morphine And Related Nausea And Vomiting    HOME MEDICATIONS: Outpatient Medications Prior to Visit  Medication Sig Dispense Refill  . acetaminophen (TYLENOL) 325 MG tablet Take 650 mg by mouth every 6 (six) hours as needed for mild pain.    . Ascorbic Acid (VITAMIN C PO) Take 2 tablets by mouth daily.    Marland Kitchen aspirin 81 MG chewable tablet aspirin 81 mg chewable tablet  Chew 1 tablet every day by oral route.    Marland Kitchen atorvastatin (LIPITOR) 40 MG tablet TAKE 1 TABLET BY MOUTH DAILY. 90 tablet 1  . Biotin (BIOTIN 5000) 5 MG CAPS Take 1 capsule by mouth daily.    . Calcium Citrate-Vitamin D (CALCITRATE/VITAMIN D PO) Take 1 tablet by mouth daily.    . carisoprodol (SOMA) 350 MG tablet Take 350 mg by mouth as needed.     Marland Kitchen  celecoxib (CELEBREX) 200 MG capsule celecoxib 200 mg capsule    . docusate sodium (COLACE) 100 MG capsule Take 100 mg by mouth daily as needed for mild constipation.    . DULoxetine (CYMBALTA) 60 MG capsule Take 2 capsules (120 mg total) by mouth daily. 120 capsule 0  . gabapentin (NEURONTIN) 100 MG capsule Take 2 capsules (200 mg total) by mouth 3 (three) times daily. 180 capsule 5  . indomethacin (INDOCIN) 25 MG capsule indomethacin 25 mg capsule prn    . meloxicam (MOBIC) 15 MG tablet meloxicam 15 mg tablet prn    . metoprolol succinate (TOPROL-XL) 25 MG 24 hr tablet metoprolol succinate ER 25 mg tablet,extended release 24  hr prn    . nitroGLYCERIN (NITROSTAT) 0.4 MG SL tablet PLACE 1 TABLET UNDER THE TONGUE EVERY 5 MINUTES AS NEEDED FOR CHEST PAIN 100 tablet 1  . promethazine (PHENERGAN) 25 MG tablet Take 25 mg by mouth every 6 (six) hours as needed for nausea or vomiting.    . traZODone (DESYREL) 50 MG tablet Take 1 tablet (50 mg total) by mouth at bedtime as needed for sleep. 90 tablet 0  . atorvastatin (LIPITOR) 40 MG tablet atorvastatin 40 mg tablet    . mirtazapine (REMERON) 30 MG tablet Take 1 tablet (30 mg total) by mouth at bedtime. 90 tablet 0   No facility-administered medications prior to visit.     PAST MEDICAL HISTORY: Past Medical History:  Diagnosis Date  . Anemia    hx  . Anxiety   . CAD (coronary artery disease)    a. 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 05/2013 Lexi CL: EF 77%, old small inferolat scar, no ischemia.  . Common migraine with intractable migraine 06/16/2017  . Depression   . Fibromyalgia   . GERD (gastroesophageal reflux disease)    hasn't started taking any meds  . History of blood transfusion    56yrs ago  . Hx of colonic polyps   . IBS (irritable bowel syndrome)    constipation  . Joint pain   . Joint swelling   . Kidney stones 03/2010  . Migraine    "q other week" (02/04/2016)  . Myocardial infarction ([place])   . Numbness    right leg  . Polycythemia   . PONV (postoperative nausea and vomiting)   . Raynaud's disease   . Scoliosis   . Shortness of breath dyspnea    with exertion    PAST SURGICAL HISTORY: Past Surgical History:  Procedure Laterality Date  . ABDOMINAL HYSTERECTOMY     partial  . APPENDECTOMY     with explor. lap.  Marland Kitchen CARDIAC CATHETERIZATION    . CHOLECYSTECTOMY  10/28/2002   lap.  . COLONOSCOPY     . DEBRIDEMENT TENNIS ELBOW    . DIAGNOSTIC LAPAROSCOPY    . DILATION AND CURETTAGE OF UTERUS    . ELBOW SURGERY     golfer's elbow-bilateral  . ESOPHAGOGASTRODUODENOSCOPY  09/10/2012   Procedure: ESOPHAGOGASTRODUODENOSCOPY (EGD);  Surgeon: Rogene Houston, MD;  Location: [facility];  Service: Endoscopy;  Laterality: N/A;  730  . EXCISION/RELEASE BURSA HIP Left 02/04/2016   Procedure: LEFT HIP TROCHANTERIC BURSECTOMY;  Surgeon: Mcarthur Rossetti,  MD;  Location: [facility];  Service: Orthopedics;  Laterality: Left;  . I&D EXTREMITY  02/25/2012   Procedure: IRRIGATION AND DEBRIDEMENT EXTREMITY;  Surgeon: Roseanne Kaufman, MD;  Location: [facility];  Service: Orthopedics;  Laterality: Right;  Irrigation and Debridement and Repair Right Thumb Nerve Laceration and Assoiated Structures   . KNEE DEBRIDEMENT     right  . LAPAROTOMY    . LEFT HEART CATHETERIZATION WITH CORONARY ANGIOGRAM N/A 01/16/2013   Procedure: LEFT HEART CATHETERIZATION WITH CORONARY ANGIOGRAM;  Surgeon: Jolaine Artist, MD;  Location: [facility] CATH [facility];  Service: Cardiovascular;  Laterality: N/A;  . STAPEDECTOMY     right  . TONSILLECTOMY AND ADENOIDECTOMY    . TRIGGER FINGER RELEASE  08/19/2011   Procedure: RELEASE TRIGGER FINGER/A-1 PULLEY;  Surgeon: Willa Frater III;  Location: [facility];  Service: Orthopedics;  Laterality: Right;  right middle finger a-1 pulley release with tenosynovectomy  . TROCHANTERIC BURSA EXCISION Right 02/04/2016  . TUBAL LIGATION    . TYMPANOPLASTY      FAMILY HISTORY: Family History  Problem Relation Age of Onset  . Cancer Mother        Deceased with lung CA  . Heart failure Mother   . Heart disease Mother   . COPD Mother   . Varicose Veins Mother   . Depression Mother   . Cancer Father        Deceased with lung CA  . Heart disease Father   . Varicose Veins Father   . Depression Father   . Depression Brother   . ADD / ADHD Son     SOCIAL HISTORY: Social History    Socioeconomic History  . Marital status: Married    Spouse name: Not on file  . Number of children: 2  . Years of education: 92  . Highest education level: Not on file  Occupational History  . Not on file  Social Needs  . Financial resource strain: Not on file  . Food insecurity:    Worry: Not on file    Inability: Not on file  . Transportation needs:    Medical: Not on file    Non-medical: Not on file  Tobacco Use  . Smoking status: Never Smoker  . Smokeless tobacco: Never Used  Substance and Sexual Activity  . Alcohol use: No    Alcohol/week: 0.0 standard drinks  . Drug use: No  . Sexual activity: Not Currently    Birth control/protection: Surgical  Lifestyle  . Physical activity:    Days per week: Not on file    Minutes per session: Not on file  . Stress: Not on file  Relationships  . Social connections:    Talks on phone: Not on file    Gets together: Not on file    Attends religious service: Not on file    Active member of club or organization: Not on file    Attends meetings of clubs or organizations: Not on file    Relationship status: Not on file  . Intimate partner violence:    Fear of current or ex partner: Not on file    Emotionally abused: Not on file    Physically abused: Not on file    Forced sexual activity: Not on file  Other Topics Concern  . Not on file  Social History Narrative   Lives in [place] with husband.     Grown children.     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   Caffeine use: none      PHYSICAL EXAM  Vitals:   06/26/18 1101  BP: 107/65  Pulse: 68  Weight: 155 lb 8 oz (70.5 kg)  Height: 5' 7.5" (1.715 m)   Body mass index is 24 kg/m.  Generalized: Well developed, in no acute distress   Neurological examination  Mentation: Alert oriented to time, place, history taking. Follows all commands speech and language fluent Cranial nerve II-XII: Pupils were equal round reactive to light. Extraocular  movements were full, visual field were full on confrontational test.  Horizontal end beat nystagmus noted when looking to the right and the left.  Facial sensation and strength were normal. Uvula tongue midline. Head turning and shoulder shrug  were normal and symmetric. Motor: The motor testing reveals 5 over 5 strength of all 4 extremities. Good symmetric motor tone is noted throughout.  Sensory: Sensory testing is intact to soft touch on all 4 extremities. No evidence of extinction is noted.  Coordination: Cerebellar testing reveals good finger-nose-finger and heel-to-shin bilaterally.  Gait and station: Gait is normal.  Reflexes: Deep tendon reflexes are symmetric and normal bilaterally.   DIAGNOSTIC DATA (LABS, IMAGING, TESTING) - I reviewed patient records, labs, notes, testing and imaging myself where available.  Lab Results  Component Value Date   WBC 5.5 02/01/2016   HGB 14.9 02/01/2016   HCT 45.0 02/01/2016   MCV 89.8 02/01/2016   PLT 200 02/01/2016      Component Value Date/Time   NA 142 02/01/2016 1053   K 4.4 02/01/2016 1053   CL 104 02/01/2016 1053   CO2 29 02/01/2016 1053   GLUCOSE 113 (H) 02/01/2016 1053   BUN 10 02/01/2016 1053   CREATININE 0.86 02/01/2016 1053   CREATININE 0.79 07/16/2014 1707   CALCIUM 10.0 02/01/2016 1053   PROT 6.2 (L) 06/17/2015 1408   ALBUMIN 3.8 06/17/2015 1408   AST 21 06/17/2015 1408   ALT 16 06/17/2015 1408   ALKPHOS 76 06/17/2015 1408   BILITOT 1.1 06/17/2015 1408   GFRNONAA >60 02/01/2016 1053   GFRAA >60 02/01/2016 1053   Lab Results  Component Value Date   CHOL 140 10/20/2014   HDL 73 10/20/2014   LDLCALC 52 10/20/2014   TRIG 76 10/20/2014   CHOLHDL 1.9 10/20/2014   Lab Results  Component Value Date   HGBA1C 5.7 (H) 10/20/2014    Lab Results  Component Value Date   TSH 3.061 10/20/2014      ASSESSMENT AND PLAN 67 y.o. year old female  has a past medical history of Anemia, Anxiety, CAD (coronary artery disease),  Common migraine with intractable migraine (06/16/2017), Depression, Fibromyalgia, GERD (gastroesophageal reflux disease), History of blood transfusion, colonic polyps, IBS (irritable bowel syndrome), Joint pain, Joint swelling, Kidney stones (03/2010), Migraine, Myocardial infarction ([place]), Numbness, Polycythemia, PONV (postoperative nausea and vomiting), Raynaud's disease, Scoliosis, and Shortness of breath dyspnea. here with:  1: Essential tremor 2: Dizziness 3. migraines  The patient will continue on gabapentin taking 1 to 2 tablets 3 times a day for tremor.  We discussed switching her to another medication such as primidone however she deferred for now.  In regards to the dizziness I will refer to vestibular rehab for evaluation of positional vertigo.  If her dizziness does not improve with vestibular rehab will consider an MRI of the brain..  Patient voiced understanding.  She will follow-up in 6 months or sooner if needed.  Ward Givens, MSN, NP-C 06/26/2018,  11:09 AM [facility] [address] [place], [place] [address] [phone]

## 2018-06-28 ENCOUNTER — Ambulatory Visit (HOSPITAL_COMMUNITY): Payer: Self-pay | Admitting: Psychiatry

## 2018-06-28 ENCOUNTER — Encounter

## 2018-07-02 ENCOUNTER — Encounter (HOSPITAL_COMMUNITY): Payer: Self-pay | Admitting: Emergency Medicine

## 2018-07-02 ENCOUNTER — Other Ambulatory Visit: Payer: Self-pay

## 2018-07-02 ENCOUNTER — Emergency Department (HOSPITAL_COMMUNITY)
Admission: EM | Admit: 2018-07-02 | Discharge: 2018-07-02 | Disposition: A | Discharge status: Home / Self Care | Payer: Medicare Other | Attending: Emergency Medicine | Admitting: Emergency Medicine

## 2018-07-02 DIAGNOSIS — I252 Old myocardial infarction: Secondary | ICD-10-CM | POA: Diagnosis not present

## 2018-07-02 DIAGNOSIS — F419 Anxiety disorder, unspecified: Secondary | ICD-10-CM | POA: Diagnosis not present

## 2018-07-02 DIAGNOSIS — Y9389 Activity, other specified: Secondary | ICD-10-CM | POA: Diagnosis not present

## 2018-07-02 DIAGNOSIS — Y998 Other external cause status: Secondary | ICD-10-CM | POA: Diagnosis not present

## 2018-07-02 DIAGNOSIS — S61011A Laceration without foreign body of right thumb without damage to nail, initial encounter: Secondary | ICD-10-CM

## 2018-07-02 DIAGNOSIS — Z7982 Long term (current) use of aspirin: Secondary | ICD-10-CM | POA: Diagnosis not present

## 2018-07-02 DIAGNOSIS — Y929 Unspecified place or not applicable: Secondary | ICD-10-CM | POA: Insufficient documentation

## 2018-07-02 DIAGNOSIS — Z9049 Acquired absence of other specified parts of digestive tract: Secondary | ICD-10-CM | POA: Insufficient documentation

## 2018-07-02 DIAGNOSIS — I2510 Atherosclerotic heart disease of native coronary artery without angina pectoris: Secondary | ICD-10-CM | POA: Diagnosis not present

## 2018-07-02 DIAGNOSIS — W01198A Fall on same level from slipping, tripping and stumbling with subsequent striking against other object, initial encounter: Secondary | ICD-10-CM | POA: Diagnosis not present

## 2018-07-02 DIAGNOSIS — Z79899 Other long term (current) drug therapy: Secondary | ICD-10-CM | POA: Insufficient documentation

## 2018-07-02 DIAGNOSIS — F329 Major depressive disorder, single episode, unspecified: Secondary | ICD-10-CM | POA: Insufficient documentation

## 2018-07-02 DIAGNOSIS — M419 Scoliosis, unspecified: Secondary | ICD-10-CM | POA: Diagnosis not present

## 2018-07-02 MED ORDER — POVIDONE-IODINE 10 % EX SOLN
CUTANEOUS | Status: AC
  Filled 2018-07-02: qty 15

## 2018-07-02 MED ORDER — LIDOCAINE HCL (PF) 1 % IJ SOLN: 5.0000 mL | Freq: Once | INTRAMUSCULAR | Status: DC

## 2018-07-02 MED ORDER — LIDOCAINE HCL (PF) 1 % IJ SOLN
INTRAMUSCULAR | Status: AC
  Filled 2018-07-02: qty 6

## 2018-07-02 NOTE — ED Provider Notes (Signed)
[facility] EMERGENCY DEPARTMENT Provider Note   CSN: [id number] Arrival date & time: 07/02/18  2135     History   Chief Complaint Chief Complaint  Patient presents with  . Laceration    HPI Stacey Lawrence is a 67 y.o. female.  Patient is a 67 year old female who presents to the emergency department with a laceration to the right thumb.  Patient states that earlier this evening she tripped, fell, and cut her thumb on a outdoor pit.  It is of note that the patient had surgery on this right thumb for tendon repair by the hand specialist in the past.  The patient is concerned if she may have done damage to the previously completed surgery.  No other injury reported at this time.  The history is provided by the patient.  Laceration      Past Medical History:  Diagnosis Date  . Anemia    hx  . Anxiety   . CAD (coronary artery disease)    a. 12/2012 NSTEMI/Cath: LM anomalous, arising in R cusp ant to RCA, otw nl, LAD 102m with bridge, RI nl, LCX nl, OM2 small, subtl occl w/ thrombus distally - felt to be spont dissection, too small for PCI->Med Rx, RCA large, dom, nl, PDA/PD nl, EF 55% w/ focal HK in mid-dist antlat wall;  b. 05/2013 Lexi CL: EF 77%, old small inferolat scar, no ischemia.  . Common migraine with intractable migraine 06/16/2017  . Depression   . Fibromyalgia   . GERD (gastroesophageal reflux disease)    hasn't started taking any meds  . History of blood transfusion    35yrs ago  . Hx of colonic polyps   . IBS (irritable bowel syndrome)    constipation  . Joint pain   . Joint swelling   . Kidney stones 03/2010  . Migraine    "q other week" (02/04/2016)  . Myocardial infarction ([place])   . Numbness    right leg  . Polycythemia   . PONV (postoperative nausea and vomiting)   . Raynaud's disease   . Scoliosis   . Shortness of breath dyspnea    with exertion    Patient Active Problem List   Diagnosis Date Noted  . Common migraine with intractable  migraine 06/16/2017  . Tremor 11/16/2016  . Moderate episode of recurrent major depressive disorder ([place]) 07/26/2016  . Trochanteric bursitis of left hip 02/04/2016  . Trochanteric bursitis of both hips 10/31/2015  . Chest pain at rest 06/17/2015  . Inferior myocardial infarction ([place]) 06/17/2015  . Sprain of interphalangeal joint of left little finger 02/05/2014  . Pain in joint, hand 02/05/2014  . Midsternal chest pain 06/04/2013  . CAD (coronary artery disease)   . Fibromyalgia   . GERD (gastroesophageal reflux disease)   . Migraine headache   . Acute non-STEMI < 8 weeks prior, subsequent admission after initial 01/16/2013  . Radial nerve laceration 03/27/2012  . Lack of coordination 03/27/2012  . Muscle weakness (generalized) 03/27/2012    Past Surgical History:  Procedure Laterality Date  . ABDOMINAL HYSTERECTOMY     partial  . APPENDECTOMY     with explor. lap.  Marland Kitchen CARDIAC CATHETERIZATION    . CHOLECYSTECTOMY  10/28/2002   lap.  . COLONOSCOPY    . DEBRIDEMENT TENNIS ELBOW    . DIAGNOSTIC LAPAROSCOPY    . DILATION AND CURETTAGE OF UTERUS    . ELBOW SURGERY     golfer's elbow-bilateral  . ESOPHAGOGASTRODUODENOSCOPY  09/10/2012  Procedure: ESOPHAGOGASTRODUODENOSCOPY (EGD);  Surgeon: Rogene Houston, MD;  [facility];  Service: Endoscopy;  Laterality: N/A;  730  . EXCISION/RELEASE BURSA HIP Left 02/04/2016   Procedure: LEFT HIP TROCHANTERIC BURSECTOMY;  Surgeon: Mcarthur Rossetti, MD;  Location: [place];  Service: Orthopedics;  Laterality: Left;  . I&D EXTREMITY  02/25/2012   Procedure: IRRIGATION AND DEBRIDEMENT EXTREMITY;  Surgeon: Roseanne Kaufman, MD;  Location: [place];  Service: Orthopedics;  Laterality: Right;  Irrigation and Debridement and Repair Right Thumb Nerve Laceration and Assoiated Structures   . KNEE DEBRIDEMENT     right  . LAPAROTOMY    . LEFT HEART CATHETERIZATION WITH CORONARY ANGIOGRAM N/A 01/16/2013   Procedure: LEFT HEART CATHETERIZATION  WITH CORONARY ANGIOGRAM;  Surgeon: Jolaine Artist, MD;  Location: [facility] CATH LAB;  Service: Cardiovascular;  Laterality: N/A;  . STAPEDECTOMY     right  . TONSILLECTOMY AND ADENOIDECTOMY    . TRIGGER FINGER RELEASE  08/19/2011   Procedure: RELEASE TRIGGER FINGER/A-1 PULLEY;  Surgeon: Willa Frater III;  Location: [place];  Service: Orthopedics;  Laterality: Right;  right middle finger a-1 pulley release with tenosynovectomy  . TROCHANTERIC BURSA EXCISION Right 02/04/2016  . TUBAL LIGATION    . TYMPANOPLASTY       OB History   None      Home Medications    Prior to Admission medications   Medication Sig Start Date End Date Taking? Authorizing Provider  Ascorbic Acid (VITAMIN C PO) Take 2 tablets by mouth daily.   Yes [provider]  aspirin 81 MG chewable tablet Chew 81 mg by mouth at bedtime.    Yes [provider]  atorvastatin (LIPITOR) 40 MG tablet TAKE 1 TABLET BY MOUTH DAILY. Patient taking differently: Take 40 mg by mouth every other day.  12/26/17  Yes Herminio Commons, MD  Biotin (BIOTIN 5000) 5 MG CAPS Take 1 capsule by mouth daily.   Yes [provider]  Calcium Citrate-Vitamin D (CALCITRATE/VITAMIN D PO) Take 1 tablet by mouth daily.   Yes [provider]  carisoprodol (SOMA) 350 MG tablet Take 350 mg by mouth daily as needed for muscle spasms.    Yes [provider]  celecoxib (CELEBREX) 200 MG capsule Take 200 mg by mouth daily.    Yes [provider]  DULoxetine (CYMBALTA) 60 MG capsule Take 2 capsules (120 mg total) by mouth daily. Patient taking differently: Take 60 mg by mouth daily.  02/19/18  Yes Norman Clay, MD  gabapentin (NEURONTIN) 100 MG capsule Take 2 capsules (200 mg total) by mouth 3 (three) times daily. Patient taking differently: Take 100-200 mg by mouth 3 (three) times daily.  12/14/17  Yes Ward Givens, NP  nitroGLYCERIN (NITROSTAT) 0.4 MG SL tablet PLACE 1 TABLET UNDER THE  TONGUE EVERY 5 MINUTES AS NEEDED FOR CHEST PAIN Patient taking differently: Place 0.4 mg under the tongue every 5 (five) minutes as needed for chest pain.  09/05/16  Yes Herminio Commons, MD  promethazine (PHENERGAN) 25 MG tablet Take 25 mg by mouth every 6 (six) hours as needed for nausea or vomiting.   Yes [provider]  traZODone (DESYREL) 50 MG tablet Take 1 tablet (50 mg total) by mouth at bedtime as needed for sleep. Patient taking differently: Take 50 mg by mouth at bedtime.  02/19/18  Yes Norman Clay, MD    Family History Family History  Problem Relation Age of Onset  . Cancer Mother  Deceased with lung CA  . Heart failure Mother   . Heart disease Mother   . COPD Mother   . Varicose Veins Mother   . Depression Mother   . Cancer Father        Deceased with lung CA  . Heart disease Father   . Varicose Veins Father   . Depression Father   . Depression Brother   . ADD / ADHD Son     Social History Social History   Tobacco Use  . Smoking status: Never Smoker  . Smokeless tobacco: Never Used  Substance Use Topics  . Alcohol use: No    Alcohol/week: 0.0 standard drinks  . Drug use: No     Allergies   Dilaudid [hydromorphone hcl]; Codeine; and Morphine and related   Review of Systems Review of Systems  Constitutional: Negative for activity change.       All ROS Neg except as noted in HPI  HENT: Negative for nosebleeds.   Eyes: Negative for photophobia and discharge.  Respiratory: Negative for cough, shortness of breath and wheezing.   Cardiovascular: Negative for chest pain and palpitations.  Gastrointestinal: Negative for abdominal pain and blood in stool.  Genitourinary: Negative for dysuria, frequency and hematuria.  Musculoskeletal: Positive for arthralgias. Negative for back pain and neck pain.  Skin: Negative.   Neurological: Negative for dizziness, seizures and speech difficulty.  Psychiatric/Behavioral: Negative for confusion and  hallucinations.     Physical Exam Updated Vital Signs BP (!) 143/56 (BP Location: Right Arm)   Pulse 73   Temp 98.3 F (36.8 C) (Oral)   Resp 16   Ht 5\' 7"  (1.702 m)   Wt 70.3 kg   SpO2 100%   BMI 24.28 kg/m   Physical Exam  Constitutional: She is oriented to person, place, and time. She appears well-developed and well-nourished.  Non-toxic appearance.  HENT:  Head: Normocephalic.  Right Ear: Tympanic membrane and external ear normal.  Left Ear: Tympanic membrane and external ear normal.  Eyes: Pupils are equal, round, and reactive to light. EOM and lids are normal.  Neck: Normal range of motion. Neck supple. Carotid bruit is not present.  Cardiovascular: Normal rate, regular rhythm, normal heart sounds, intact distal pulses and normal pulses.  Pulmonary/Chest: Breath sounds normal. No respiratory distress.  Abdominal: Soft. Bowel sounds are normal. There is no tenderness. There is no guarding.  Musculoskeletal: Normal range of motion.       Hands: There is good range of motion of the right shoulder, elbow, and wrist.  There are degenerative joint disease changes noted of the upper extremities.  Capillary refill is less than 2 seconds.  Radial pulses 2+.  Patient has a 2.4 cm laceration at the base of the right thumb extending into the webspace.  The joint capsule is not exposed.  There does not appear to be any tendon involvement or bone involvement.  Lymphadenopathy:       Head (right side): No submandibular adenopathy present.       Head (left side): No submandibular adenopathy present.    She has no cervical adenopathy.  Neurological: She is alert and oriented to person, place, and time. She has normal strength. No cranial nerve deficit or sensory deficit.  No motor or sensory deficits noted of the right thumb or the other fingers of the right upper extremity.  Skin: Skin is warm and dry.  Psychiatric: She has a normal mood and affect. Her speech is normal.  Nursing note  and vitals reviewed.    ED Treatments / Results  Labs (all labs ordered are listed, but only abnormal results are displayed) Labs Reviewed - No data to display  EKG None  Radiology No results found.  Procedures .Marland KitchenLaceration Repair Date/Time: 07/02/2018 11:11 PM Performed by: Lily Kocher, PA-C Authorized by: Lily Kocher, PA-C   Consent:    Consent obtained:  Verbal   Consent given by:  Patient   Risks discussed:  Infection, pain, poor cosmetic result and poor wound healing Universal protocol:    Procedure explained and questions answered to patient or proxy's satisfaction: yes     Immediately prior to procedure, a time out was called: yes     Patient identity confirmed:  Arm band Anesthesia (see MAR for exact dosages):    Anesthesia method:  Local infiltration   Local anesthetic:  Lidocaine 1% w/o epi Laceration details:    Location:  Hand   Hand location: base of right thumb and web space.   Length (cm):  2.4 Repair type:    Repair type:  Simple Pre-procedure details:    Preparation:  Patient was prepped and draped in usual sterile fashion Exploration:    Hemostasis achieved with:  Direct pressure   Wound exploration: wound explored through full range of motion     Wound extent: no foreign bodies/material noted, no nerve damage noted, no tendon damage noted and no underlying fracture noted   Treatment:    Area cleansed with:  Betadine   Amount of cleaning:  Extensive   Irrigation solution:  Sterile saline   Irrigation volume:  1 liter Skin repair:    Repair method:  Sutures   Suture size:  4-0   Suture material:  Nylon   Suture technique:  Simple interrupted   Number of sutures:  7 Approximation:    Approximation:  Close Post-procedure details:    Dressing:  Sterile dressing   Patient tolerance of procedure:  Tolerated well, no immediate complications   (including critical care time)  Medications Ordered in ED Medications  povidone-iodine  (BETADINE) 10 % external solution (has no administration in time range)  lidocaine (PF) (XYLOCAINE) 1 % injection 5 mL (has no administration in time range)     Initial Impression / Assessment and Plan / ED Course  I have reviewed the triage vital signs and the nursing notes.  Pertinent labs & imaging results that were available during my care of the patient were reviewed by me and considered in my medical decision making (see chart for details).       Final Clinical Impressions(s) / ED Diagnoses MDM  Vital signs within normal limits.  Pulse oximetry is 100% on room air.  Within normal limits by my interpretation.  Patient sustained a laceration to the thumb of the right hand.  The laceration was evaluated carefully.  No bone or tendon involvement.  No neurovascular deficit appreciated.  The wound was repaired with 7 interrupted sutures of 4-0 nylon.  The patient is to have the sutures removed in 7 or 8 days.  The patient has had previous tendon surgery.  I have asked the patient to see the hand surgeon if any changes in her condition, problems, or concerns.  Patient is in agreement with this plan.   Final diagnoses:  Laceration of right thumb without foreign body without damage to nail, initial encounter    ED Discharge Orders    None       Lily Kocher, PA-C 07/02/18 2318  Milton Ferguson, MD 07/04/18 [phone]

## 2018-07-02 NOTE — ED Triage Notes (Signed)
Pt tripped and fell and cut anterior right thumb at proximal joint area. Bleeding controlled. Nad.

## 2018-07-02 NOTE — ED Notes (Signed)
Pt ambulatory to waiting room. Pt verbalized understanding of discharge instructions.   

## 2018-07-02 NOTE — Discharge Instructions (Addendum)
Please keep wound clean and dry.  Please see the hand specialist or return to the emergency department if any signs of advancing infection.  Please have your sutures removed in 7 or 8 days.

## 2018-07-11 DIAGNOSIS — M79644 Pain in right finger(s): Secondary | ICD-10-CM | POA: Diagnosis not present

## 2018-07-11 DIAGNOSIS — S61011A Laceration without foreign body of right thumb without damage to nail, initial encounter: Secondary | ICD-10-CM | POA: Diagnosis not present

## 2018-07-11 MED FILL — traZODone HCL 50 MG TABS: 50 | 30 days supply | Qty: 30 | Fill #0

## 2018-07-25 DIAGNOSIS — M533 Sacrococcygeal disorders, not elsewhere classified: Secondary | ICD-10-CM | POA: Diagnosis not present

## 2018-07-26 ENCOUNTER — Ambulatory Visit (INDEPENDENT_AMBULATORY_CARE_PROVIDER_SITE_OTHER): Payer: Medicare Other | Admitting: Otolaryngology

## 2018-07-26 DIAGNOSIS — H903 Sensorineural hearing loss, bilateral: Secondary | ICD-10-CM

## 2018-07-26 DIAGNOSIS — H9209 Otalgia, unspecified ear: Secondary | ICD-10-CM

## 2018-07-27 ENCOUNTER — Encounter (HOSPITAL_COMMUNITY): Payer: Self-pay | Admitting: Psychiatry

## 2018-07-27 ENCOUNTER — Ambulatory Visit (INDEPENDENT_AMBULATORY_CARE_PROVIDER_SITE_OTHER): Payer: Medicare Other | Admitting: Psychiatry

## 2018-07-27 DIAGNOSIS — F331 Major depressive disorder, recurrent, moderate: Secondary | ICD-10-CM | POA: Diagnosis not present
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
## 2018-07-31 ENCOUNTER — Encounter (HOSPITAL_COMMUNITY): Payer: Self-pay | Admitting: Psychiatry

## 2018-07-31 ENCOUNTER — Ambulatory Visit (INDEPENDENT_AMBULATORY_CARE_PROVIDER_SITE_OTHER): Payer: Medicare Other | Admitting: Psychiatry

## 2018-07-31 VITALS — BP 132/71 | HR 71 | Ht 67.0 in | Wt 158.0 lb

## 2018-07-31 DIAGNOSIS — F330 Major depressive disorder, recurrent, mild: Secondary | ICD-10-CM | POA: Diagnosis not present

## 2018-07-31 MED ORDER — DULOXETINE HCL 60 MG PO CPEP: ORAL_CAPSULE | ORAL | 0 refills | Status: DC

## 2018-07-31 MED ORDER — TRAZODONE HCL 50 MG PO TABS: 50.0000 mg | ORAL_TABLET | Freq: Every evening | ORAL | 0 refills | Status: DC | PRN

## 2018-07-31 MED ORDER — DULOXETINE HCL 60 MG PO CPEP: 120.0000 mg | ORAL_CAPSULE | Freq: Every day | ORAL | 0 refills | Status: DC

## 2018-07-31 MED ORDER — DULOXETINE HCL 30 MG PO CPEP: ORAL_CAPSULE | ORAL | 0 refills | Status: DC

## 2018-07-31 MED FILL — DULoxetine HCL 60 MG CPEP: 60 | 90 days supply | Qty: 90 | Fill #0

## 2018-07-31 MED FILL — DULoxetine HCL 30 MG CPEP: 30 | 90 days supply | Qty: 90 | Fill #0

## 2018-07-31 NOTE — Patient Instructions (Signed)
1.Decrease duloxetine 90 (60 mg + 30 mg) daily 3. Continue Trazodone 50 mg at night as needed for sleep 4.Return to clinic in three months for 15 mins

## 2018-08-09 DIAGNOSIS — I2510 Atherosclerotic heart disease of native coronary artery without angina pectoris: Secondary | ICD-10-CM | POA: Diagnosis not present

## 2018-08-09 DIAGNOSIS — K581 Irritable bowel syndrome with constipation: Secondary | ICD-10-CM | POA: Diagnosis not present

## 2018-08-09 DIAGNOSIS — M797 Fibromyalgia: Secondary | ICD-10-CM | POA: Diagnosis not present

## 2018-08-09 DIAGNOSIS — I252 Old myocardial infarction: Secondary | ICD-10-CM | POA: Diagnosis not present

## 2018-08-09 DIAGNOSIS — I7300 Raynaud's syndrome without gangrene: Secondary | ICD-10-CM | POA: Diagnosis not present

## 2018-08-09 DIAGNOSIS — K219 Gastro-esophageal reflux disease without esophagitis: Secondary | ICD-10-CM | POA: Diagnosis not present

## 2018-08-09 DIAGNOSIS — G43909 Migraine, unspecified, not intractable, without status migrainosus: Secondary | ICD-10-CM | POA: Diagnosis not present

## 2018-08-09 DIAGNOSIS — M419 Scoliosis, unspecified: Secondary | ICD-10-CM | POA: Diagnosis not present

## 2018-08-09 DIAGNOSIS — D45 Polycythemia vera: Secondary | ICD-10-CM | POA: Diagnosis not present

## 2018-08-10 ENCOUNTER — Telehealth (HOSPITAL_COMMUNITY): Payer: Self-pay | Admitting: Psychiatry

## 2018-08-10 ENCOUNTER — Ambulatory Visit (INDEPENDENT_AMBULATORY_CARE_PROVIDER_SITE_OTHER): Payer: Medicare Other | Admitting: Psychiatry

## 2018-08-10 DIAGNOSIS — F330 Major depressive disorder, recurrent, mild: Secondary | ICD-10-CM

## 2018-08-10 MED FILL — traZODone HCL 50 MG TABS: 50 | 90 days supply | Qty: 90 | Fill #0
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
## 2018-08-10 NOTE — Telephone Encounter (Signed)
Spoke with [facility] script for  07/11/18 was a 30 day supply. Will fill 90 day supply today-- Patient aware

## 2018-08-10 NOTE — Telephone Encounter (Signed)
Patient dropped in the office today, and she asked the trazodone refill to be ordered. Could you verify with the pharmacy- the medication was ordered on 11/12 for 90 tabs.

## 2018-08-13 MED FILL — GABAPENTIN 100 MG CAPS: 100 | 30 days supply | Qty: 180 | Fill #1

## 2018-08-23 ENCOUNTER — Ambulatory Visit (INDEPENDENT_AMBULATORY_CARE_PROVIDER_SITE_OTHER): Payer: Medicare Other | Admitting: Psychiatry

## 2018-08-23 DIAGNOSIS — F330 Major depressive disorder, recurrent, mild: Secondary | ICD-10-CM | POA: Diagnosis not present
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
## 2018-08-29 MED FILL — CARISOPRODOL 350 MG TABS: 350 | 20 days supply | Qty: 60 | Fill #1

## 2018-08-30 DIAGNOSIS — H43811 Vitreous degeneration, right eye: Secondary | ICD-10-CM | POA: Diagnosis not present

## 2018-08-30 DIAGNOSIS — H2513 Age-related nuclear cataract, bilateral: Secondary | ICD-10-CM | POA: Diagnosis not present

## 2018-08-30 DIAGNOSIS — H04123 Dry eye syndrome of bilateral lacrimal glands: Secondary | ICD-10-CM | POA: Diagnosis not present

## 2018-08-31 DIAGNOSIS — M79671 Pain in right foot: Secondary | ICD-10-CM | POA: Diagnosis not present

## 2018-08-31 DIAGNOSIS — M722 Plantar fascial fibromatosis: Secondary | ICD-10-CM | POA: Diagnosis not present

## 2018-09-04 ENCOUNTER — Ambulatory Visit (HOSPITAL_COMMUNITY): Payer: Self-pay | Admitting: Psychiatry

## 2018-09-06 DIAGNOSIS — K219 Gastro-esophageal reflux disease without esophagitis: Secondary | ICD-10-CM | POA: Diagnosis not present

## 2018-09-06 DIAGNOSIS — M419 Scoliosis, unspecified: Secondary | ICD-10-CM | POA: Diagnosis not present

## 2018-09-06 DIAGNOSIS — I2510 Atherosclerotic heart disease of native coronary artery without angina pectoris: Secondary | ICD-10-CM | POA: Diagnosis not present

## 2018-09-06 DIAGNOSIS — I658 Occlusion and stenosis of other precerebral arteries: Secondary | ICD-10-CM | POA: Diagnosis not present

## 2018-09-06 DIAGNOSIS — Z79899 Other long term (current) drug therapy: Secondary | ICD-10-CM | POA: Diagnosis not present

## 2018-09-06 DIAGNOSIS — D45 Polycythemia vera: Secondary | ICD-10-CM | POA: Diagnosis not present

## 2018-09-06 DIAGNOSIS — K581 Irritable bowel syndrome with constipation: Secondary | ICD-10-CM | POA: Diagnosis not present

## 2018-09-06 DIAGNOSIS — G43909 Migraine, unspecified, not intractable, without status migrainosus: Secondary | ICD-10-CM | POA: Diagnosis not present

## 2018-09-06 DIAGNOSIS — I214 Non-ST elevation (NSTEMI) myocardial infarction: Secondary | ICD-10-CM | POA: Diagnosis not present

## 2018-09-06 DIAGNOSIS — I7300 Raynaud's syndrome without gangrene: Secondary | ICD-10-CM | POA: Diagnosis not present

## 2018-09-06 DIAGNOSIS — Z8673 Personal history of transient ischemic attack (TIA), and cerebral infarction without residual deficits: Secondary | ICD-10-CM | POA: Diagnosis not present

## 2018-09-06 DIAGNOSIS — M797 Fibromyalgia: Secondary | ICD-10-CM | POA: Diagnosis not present

## 2018-09-10 DIAGNOSIS — D45 Polycythemia vera: Secondary | ICD-10-CM | POA: Diagnosis not present

## 2018-09-18 ENCOUNTER — Ambulatory Visit (INDEPENDENT_AMBULATORY_CARE_PROVIDER_SITE_OTHER): Payer: Medicare Other | Admitting: Psychiatry

## 2018-09-18 DIAGNOSIS — F330 Major depressive disorder, recurrent, mild: Secondary | ICD-10-CM

## 2018-09-18 DIAGNOSIS — Z23 Encounter for immunization: Secondary | ICD-10-CM | POA: Diagnosis not present
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
## 2018-09-24 DIAGNOSIS — I252 Old myocardial infarction: Secondary | ICD-10-CM | POA: Diagnosis not present

## 2018-09-24 DIAGNOSIS — R0600 Dyspnea, unspecified: Secondary | ICD-10-CM | POA: Diagnosis not present

## 2018-09-24 DIAGNOSIS — I7300 Raynaud's syndrome without gangrene: Secondary | ICD-10-CM | POA: Diagnosis not present

## 2018-09-24 DIAGNOSIS — I2510 Atherosclerotic heart disease of native coronary artery without angina pectoris: Secondary | ICD-10-CM | POA: Diagnosis not present

## 2018-09-24 DIAGNOSIS — D751 Secondary polycythemia: Secondary | ICD-10-CM | POA: Diagnosis not present

## 2018-09-24 DIAGNOSIS — K219 Gastro-esophageal reflux disease without esophagitis: Secondary | ICD-10-CM | POA: Diagnosis not present

## 2018-09-24 DIAGNOSIS — Z8669 Personal history of other diseases of the nervous system and sense organs: Secondary | ICD-10-CM | POA: Diagnosis not present

## 2018-09-24 DIAGNOSIS — M797 Fibromyalgia: Secondary | ICD-10-CM | POA: Diagnosis not present

## 2018-09-24 DIAGNOSIS — K589 Irritable bowel syndrome without diarrhea: Secondary | ICD-10-CM | POA: Diagnosis not present

## 2018-09-24 DIAGNOSIS — M419 Scoliosis, unspecified: Secondary | ICD-10-CM | POA: Diagnosis not present

## 2018-09-27 DIAGNOSIS — D225 Melanocytic nevi of trunk: Secondary | ICD-10-CM | POA: Diagnosis not present

## 2018-09-27 DIAGNOSIS — Z1283 Encounter for screening for malignant neoplasm of skin: Secondary | ICD-10-CM | POA: Diagnosis not present

## 2018-09-27 DIAGNOSIS — L304 Erythema intertrigo: Secondary | ICD-10-CM | POA: Diagnosis not present

## 2018-09-27 DIAGNOSIS — L820 Inflamed seborrheic keratosis: Secondary | ICD-10-CM | POA: Diagnosis not present

## 2018-10-01 DIAGNOSIS — M65331 Trigger finger, right middle finger: Secondary | ICD-10-CM | POA: Diagnosis not present

## 2018-10-01 DIAGNOSIS — M79641 Pain in right hand: Secondary | ICD-10-CM | POA: Diagnosis not present

## 2018-10-02 ENCOUNTER — Encounter (HOSPITAL_COMMUNITY): Payer: Self-pay | Admitting: Psychiatry

## 2018-10-02 ENCOUNTER — Ambulatory Visit (INDEPENDENT_AMBULATORY_CARE_PROVIDER_SITE_OTHER): Payer: Medicare Other | Admitting: Psychiatry

## 2018-10-02 DIAGNOSIS — F330 Major depressive disorder, recurrent, mild: Secondary | ICD-10-CM
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
## 2018-10-05 DIAGNOSIS — H04123 Dry eye syndrome of bilateral lacrimal glands: Secondary | ICD-10-CM | POA: Diagnosis not present

## 2018-10-05 DIAGNOSIS — E214 Other specified disorders of parathyroid gland: Secondary | ICD-10-CM | POA: Diagnosis not present

## 2018-10-16 ENCOUNTER — Ambulatory Visit (INDEPENDENT_AMBULATORY_CARE_PROVIDER_SITE_OTHER): Payer: Medicare Other | Admitting: Psychiatry

## 2018-10-16 ENCOUNTER — Encounter (HOSPITAL_COMMUNITY): Payer: Self-pay | Admitting: Psychiatry

## 2018-10-16 DIAGNOSIS — F330 Major depressive disorder, recurrent, mild: Secondary | ICD-10-CM | POA: Diagnosis not present
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
## 2018-10-17 ENCOUNTER — Other Ambulatory Visit: Payer: Self-pay | Admitting: Cardiovascular Disease

## 2018-10-17 MED FILL — NITROGLYCERIN 0.4 MG TAB SL: 0.4 | 90 days supply | Qty: 100 | Fill #0

## 2018-10-22 MED FILL — CELECOXIB 200 MG CAP: 200 | 30 days supply | Qty: 30 | Fill #0

## 2018-10-25 DIAGNOSIS — M533 Sacrococcygeal disorders, not elsewhere classified: Secondary | ICD-10-CM | POA: Diagnosis not present

## 2018-10-25 DIAGNOSIS — M5136 Other intervertebral disc degeneration, lumbar region: Secondary | ICD-10-CM | POA: Diagnosis not present

## 2018-10-25 DIAGNOSIS — M9904 Segmental and somatic dysfunction of sacral region: Secondary | ICD-10-CM | POA: Diagnosis not present

## 2018-10-30 ENCOUNTER — Encounter (HOSPITAL_COMMUNITY): Payer: Self-pay | Admitting: Psychiatry

## 2018-10-30 ENCOUNTER — Ambulatory Visit (INDEPENDENT_AMBULATORY_CARE_PROVIDER_SITE_OTHER): Payer: Medicare Other | Admitting: Psychiatry

## 2018-10-30 DIAGNOSIS — F330 Major depressive disorder, recurrent, mild: Secondary | ICD-10-CM | POA: Diagnosis not present
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
## 2018-11-02 DIAGNOSIS — M545 Low back pain: Secondary | ICD-10-CM | POA: Diagnosis not present

## 2018-11-02 DIAGNOSIS — M25552 Pain in left hip: Secondary | ICD-10-CM | POA: Diagnosis not present

## 2018-11-02 DIAGNOSIS — M25551 Pain in right hip: Secondary | ICD-10-CM | POA: Diagnosis not present

## 2018-11-05 DIAGNOSIS — H04123 Dry eye syndrome of bilateral lacrimal glands: Secondary | ICD-10-CM | POA: Diagnosis not present

## 2018-11-08 DIAGNOSIS — M533 Sacrococcygeal disorders, not elsewhere classified: Secondary | ICD-10-CM | POA: Diagnosis not present

## 2018-11-08 MED FILL — GABAPENTIN 100 MG CAPSULE: 100 | 30 days supply | Qty: 180 | Fill #2

## 2018-11-13 DIAGNOSIS — M25551 Pain in right hip: Secondary | ICD-10-CM | POA: Diagnosis not present

## 2018-11-13 DIAGNOSIS — M545 Low back pain: Secondary | ICD-10-CM | POA: Diagnosis not present

## 2018-11-13 DIAGNOSIS — M25552 Pain in left hip: Secondary | ICD-10-CM | POA: Diagnosis not present

## 2018-11-14 ENCOUNTER — Other Ambulatory Visit (HOSPITAL_COMMUNITY): Payer: Self-pay | Admitting: Psychiatry

## 2018-11-14 MED ORDER — TRAZODONE HCL 50 MG PO TABS: 50.0000 mg | ORAL_TABLET | Freq: Every evening | ORAL | 0 refills | Status: DC | PRN

## 2018-11-14 MED FILL — traZODone HCL 50 MG TABS: 50 | 90 days supply | Qty: 90 | Fill #0

## 2018-11-15 DIAGNOSIS — M545 Low back pain: Secondary | ICD-10-CM | POA: Diagnosis not present

## 2018-11-15 DIAGNOSIS — M25551 Pain in right hip: Secondary | ICD-10-CM | POA: Diagnosis not present

## 2018-11-15 DIAGNOSIS — M25552 Pain in left hip: Secondary | ICD-10-CM | POA: Diagnosis not present

## 2018-11-16 ENCOUNTER — Other Ambulatory Visit (HOSPITAL_COMMUNITY): Payer: Self-pay | Admitting: Obstetrics and Gynecology

## 2018-11-16 DIAGNOSIS — Z1231 Encounter for screening mammogram for malignant neoplasm of breast: Secondary | ICD-10-CM
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
## 2018-11-19 ENCOUNTER — Encounter (HOSPITAL_COMMUNITY): Payer: Self-pay | Admitting: Psychiatry

## 2018-11-19 ENCOUNTER — Ambulatory Visit (INDEPENDENT_AMBULATORY_CARE_PROVIDER_SITE_OTHER): Payer: Medicare Other | Admitting: Psychiatry

## 2018-11-19 VITALS — BP 120/77 | HR 77 | Ht 67.0 in | Wt 159.0 lb

## 2018-11-19 DIAGNOSIS — F330 Major depressive disorder, recurrent, mild: Secondary | ICD-10-CM | POA: Diagnosis not present

## 2018-11-19 MED ORDER — DULOXETINE HCL 60 MG PO CPEP: 60.0000 mg | ORAL_CAPSULE | Freq: Every day | ORAL | 0 refills | Status: DC

## 2018-11-19 MED FILL — DULoxetine HCL 60 MG CPEP: 60 | 90 days supply | Qty: 90 | Fill #0

## 2018-11-19 NOTE — Patient Instructions (Signed)
1.Continue duloxetine 60 mg daily  2. Continue Trazodone 50 mg at night as needed for sleep 3.Return to clinic inthree months for 15 mins

## 2018-11-20 DIAGNOSIS — M25552 Pain in left hip: Secondary | ICD-10-CM | POA: Diagnosis not present

## 2018-11-20 DIAGNOSIS — M545 Low back pain: Secondary | ICD-10-CM | POA: Diagnosis not present

## 2018-11-20 DIAGNOSIS — M25551 Pain in right hip: Secondary | ICD-10-CM | POA: Diagnosis not present

## 2018-11-22 DIAGNOSIS — M25551 Pain in right hip: Secondary | ICD-10-CM | POA: Diagnosis not present

## 2018-11-22 DIAGNOSIS — M545 Low back pain: Secondary | ICD-10-CM | POA: Diagnosis not present

## 2018-11-22 DIAGNOSIS — M25552 Pain in left hip: Secondary | ICD-10-CM | POA: Diagnosis not present

## 2018-11-27 DIAGNOSIS — M9904 Segmental and somatic dysfunction of sacral region: Secondary | ICD-10-CM | POA: Diagnosis not present

## 2018-11-28 ENCOUNTER — Other Ambulatory Visit: Payer: Self-pay

## 2018-11-28 ENCOUNTER — Ambulatory Visit (HOSPITAL_COMMUNITY)
Admission: RE | Admit: 2018-11-28 | Discharge: 2018-11-28 | Disposition: A | Discharge status: Home / Self Care | Payer: Medicare Other | Source: Ambulatory Visit | Attending: Obstetrics and Gynecology | Admitting: Obstetrics and Gynecology

## 2018-11-28 ENCOUNTER — Encounter (HOSPITAL_COMMUNITY): Payer: Self-pay

## 2018-11-28 ENCOUNTER — Ambulatory Visit (INDEPENDENT_AMBULATORY_CARE_PROVIDER_SITE_OTHER): Payer: Medicare Other | Admitting: Psychiatry

## 2018-11-28 DIAGNOSIS — Z1231 Encounter for screening mammogram for malignant neoplasm of breast: Secondary | ICD-10-CM | POA: Insufficient documentation

## 2018-11-28 DIAGNOSIS — F330 Major depressive disorder, recurrent, mild: Secondary | ICD-10-CM | POA: Diagnosis not present
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
## 2018-12-12 ENCOUNTER — Other Ambulatory Visit: Payer: Self-pay

## 2018-12-12 ENCOUNTER — Ambulatory Visit (INDEPENDENT_AMBULATORY_CARE_PROVIDER_SITE_OTHER): Payer: Medicare Other | Admitting: Psychiatry

## 2018-12-12 DIAGNOSIS — F330 Major depressive disorder, recurrent, mild: Secondary | ICD-10-CM

## 2018-12-12 DIAGNOSIS — F329 Major depressive disorder, single episode, unspecified: Secondary | ICD-10-CM | POA: Diagnosis not present
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
## 2018-12-18 DIAGNOSIS — M79671 Pain in right foot: Secondary | ICD-10-CM | POA: Diagnosis not present

## 2018-12-18 DIAGNOSIS — M722 Plantar fascial fibromatosis: Secondary | ICD-10-CM | POA: Diagnosis not present

## 2018-12-24 DIAGNOSIS — I2510 Atherosclerotic heart disease of native coronary artery without angina pectoris: Secondary | ICD-10-CM | POA: Diagnosis not present

## 2018-12-24 DIAGNOSIS — K219 Gastro-esophageal reflux disease without esophagitis: Secondary | ICD-10-CM | POA: Diagnosis not present

## 2018-12-24 DIAGNOSIS — M797 Fibromyalgia: Secondary | ICD-10-CM | POA: Diagnosis not present

## 2018-12-24 DIAGNOSIS — R0600 Dyspnea, unspecified: Secondary | ICD-10-CM | POA: Diagnosis not present

## 2018-12-24 DIAGNOSIS — D751 Secondary polycythemia: Secondary | ICD-10-CM | POA: Diagnosis not present

## 2018-12-24 DIAGNOSIS — K7689 Other specified diseases of liver: Secondary | ICD-10-CM | POA: Diagnosis not present

## 2018-12-24 DIAGNOSIS — G43909 Migraine, unspecified, not intractable, without status migrainosus: Secondary | ICD-10-CM | POA: Diagnosis not present

## 2018-12-24 DIAGNOSIS — K581 Irritable bowel syndrome with constipation: Secondary | ICD-10-CM | POA: Diagnosis not present

## 2018-12-24 DIAGNOSIS — I340 Nonrheumatic mitral (valve) insufficiency: Secondary | ICD-10-CM | POA: Diagnosis not present

## 2018-12-24 DIAGNOSIS — M419 Scoliosis, unspecified: Secondary | ICD-10-CM | POA: Diagnosis not present

## 2018-12-24 DIAGNOSIS — I252 Old myocardial infarction: Secondary | ICD-10-CM | POA: Diagnosis not present

## 2018-12-24 DIAGNOSIS — I7300 Raynaud's syndrome without gangrene: Secondary | ICD-10-CM | POA: Diagnosis not present

## 2018-12-24 DIAGNOSIS — D45 Polycythemia vera: Secondary | ICD-10-CM | POA: Diagnosis not present

## 2018-12-27 ENCOUNTER — Ambulatory Visit (HOSPITAL_COMMUNITY): Payer: Medicare Other | Admitting: Psychiatry

## 2018-12-27 ENCOUNTER — Other Ambulatory Visit: Payer: Self-pay

## 2018-12-27 ENCOUNTER — Ambulatory Visit (INDEPENDENT_AMBULATORY_CARE_PROVIDER_SITE_OTHER): Payer: Medicare Other | Admitting: Psychiatry

## 2018-12-27 DIAGNOSIS — Z6824 Body mass index (BMI) 24.0-24.9, adult: Secondary | ICD-10-CM | POA: Diagnosis not present

## 2018-12-27 DIAGNOSIS — R7301 Impaired fasting glucose: Secondary | ICD-10-CM | POA: Diagnosis not present

## 2018-12-27 DIAGNOSIS — F330 Major depressive disorder, recurrent, mild: Secondary | ICD-10-CM | POA: Diagnosis not present

## 2018-12-27 DIAGNOSIS — E559 Vitamin D deficiency, unspecified: Secondary | ICD-10-CM | POA: Diagnosis not present
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
## 2018-12-31 DIAGNOSIS — D45 Polycythemia vera: Secondary | ICD-10-CM | POA: Diagnosis not present

## 2018-12-31 DIAGNOSIS — K581 Irritable bowel syndrome with constipation: Secondary | ICD-10-CM | POA: Diagnosis not present

## 2018-12-31 DIAGNOSIS — R251 Tremor, unspecified: Secondary | ICD-10-CM | POA: Diagnosis not present

## 2018-12-31 DIAGNOSIS — M9904 Segmental and somatic dysfunction of sacral region: Secondary | ICD-10-CM | POA: Diagnosis not present

## 2018-12-31 DIAGNOSIS — G4700 Insomnia, unspecified: Secondary | ICD-10-CM | POA: Diagnosis not present

## 2018-12-31 DIAGNOSIS — K219 Gastro-esophageal reflux disease without esophagitis: Secondary | ICD-10-CM | POA: Diagnosis not present

## 2018-12-31 DIAGNOSIS — R7301 Impaired fasting glucose: Secondary | ICD-10-CM | POA: Diagnosis not present

## 2018-12-31 DIAGNOSIS — F411 Generalized anxiety disorder: Secondary | ICD-10-CM | POA: Diagnosis not present

## 2018-12-31 DIAGNOSIS — E559 Vitamin D deficiency, unspecified: Secondary | ICD-10-CM | POA: Diagnosis not present

## 2018-12-31 DIAGNOSIS — G43019 Migraine without aura, intractable, without status migrainosus: Secondary | ICD-10-CM | POA: Diagnosis not present

## 2018-12-31 DIAGNOSIS — I2510 Atherosclerotic heart disease of native coronary artery without angina pectoris: Secondary | ICD-10-CM | POA: Diagnosis not present

## 2019-01-01 ENCOUNTER — Encounter (HOSPITAL_COMMUNITY): Payer: Self-pay | Admitting: Emergency Medicine

## 2019-01-01 ENCOUNTER — Telehealth: Payer: Self-pay

## 2019-01-01 ENCOUNTER — Ambulatory Visit (HOSPITAL_COMMUNITY): Payer: Medicare Other | Admitting: Psychiatry

## 2019-01-01 ENCOUNTER — Emergency Department (HOSPITAL_COMMUNITY): Payer: Medicare Other

## 2019-01-01 ENCOUNTER — Other Ambulatory Visit: Payer: Self-pay

## 2019-01-01 ENCOUNTER — Observation Stay (HOSPITAL_COMMUNITY)
Admission: EM | Admit: 2019-01-01 | Discharge: 2019-01-02 | Disposition: A | Discharge status: Home / Self Care | Payer: Medicare Other | Attending: Cardiovascular Disease | Admitting: Cardiovascular Disease

## 2019-01-01 DIAGNOSIS — I208 Other forms of angina pectoris: Secondary | ICD-10-CM

## 2019-01-01 DIAGNOSIS — Z7982 Long term (current) use of aspirin: Secondary | ICD-10-CM | POA: Diagnosis not present

## 2019-01-01 DIAGNOSIS — K589 Irritable bowel syndrome without diarrhea: Secondary | ICD-10-CM | POA: Diagnosis not present

## 2019-01-01 DIAGNOSIS — R079 Chest pain, unspecified: Secondary | ICD-10-CM | POA: Diagnosis not present

## 2019-01-01 DIAGNOSIS — I83899 Varicose veins of unspecified lower extremities with other complications: Secondary | ICD-10-CM | POA: Diagnosis not present

## 2019-01-01 DIAGNOSIS — K219 Gastro-esophageal reflux disease without esophagitis: Secondary | ICD-10-CM | POA: Insufficient documentation

## 2019-01-01 DIAGNOSIS — Z885 Allergy status to narcotic agent status: Secondary | ICD-10-CM | POA: Diagnosis not present

## 2019-01-01 DIAGNOSIS — I200 Unstable angina: Secondary | ICD-10-CM | POA: Diagnosis not present

## 2019-01-01 DIAGNOSIS — F419 Anxiety disorder, unspecified: Secondary | ICD-10-CM | POA: Diagnosis not present

## 2019-01-01 DIAGNOSIS — Z79899 Other long term (current) drug therapy: Secondary | ICD-10-CM | POA: Insufficient documentation

## 2019-01-01 DIAGNOSIS — I252 Old myocardial infarction: Secondary | ICD-10-CM | POA: Diagnosis not present

## 2019-01-01 DIAGNOSIS — E875 Hyperkalemia: Secondary | ICD-10-CM | POA: Diagnosis not present

## 2019-01-01 DIAGNOSIS — Z818 Family history of other mental and behavioral disorders: Secondary | ICD-10-CM | POA: Diagnosis not present

## 2019-01-01 DIAGNOSIS — F329 Major depressive disorder, single episode, unspecified: Secondary | ICD-10-CM | POA: Diagnosis not present

## 2019-01-01 DIAGNOSIS — I83819 Varicose veins of unspecified lower extremities with pain: Secondary | ICD-10-CM | POA: Insufficient documentation

## 2019-01-01 DIAGNOSIS — E785 Hyperlipidemia, unspecified: Secondary | ICD-10-CM | POA: Diagnosis not present

## 2019-01-01 DIAGNOSIS — Z791 Long term (current) use of non-steroidal anti-inflammatories (NSAID): Secondary | ICD-10-CM | POA: Insufficient documentation

## 2019-01-01 DIAGNOSIS — I7300 Raynaud's syndrome without gangrene: Secondary | ICD-10-CM | POA: Diagnosis not present

## 2019-01-01 DIAGNOSIS — D45 Polycythemia vera: Secondary | ICD-10-CM | POA: Insufficient documentation

## 2019-01-01 DIAGNOSIS — I2542 Coronary artery dissection: Secondary | ICD-10-CM | POA: Insufficient documentation

## 2019-01-01 DIAGNOSIS — G43909 Migraine, unspecified, not intractable, without status migrainosus: Secondary | ICD-10-CM | POA: Insufficient documentation

## 2019-01-01 DIAGNOSIS — I25110 Atherosclerotic heart disease of native coronary artery with unstable angina pectoris: Principal | ICD-10-CM | POA: Insufficient documentation

## 2019-01-01 DIAGNOSIS — M797 Fibromyalgia: Secondary | ICD-10-CM | POA: Insufficient documentation

## 2019-01-01 DIAGNOSIS — Z8249 Family history of ischemic heart disease and other diseases of the circulatory system: Secondary | ICD-10-CM | POA: Diagnosis not present

## 2019-01-01 DIAGNOSIS — I2510 Atherosclerotic heart disease of native coronary artery without angina pectoris: Secondary | ICD-10-CM | POA: Diagnosis present

## 2019-01-01 HISTORY — DX: Personal history of urinary calculi: Z87.442

## 2019-01-01 LAB — CREATININE, SERUM
Creatinine, Ser: 1.11 mg/dL — ABNORMAL HIGH (ref 0.44–1.00)
GFR calc Af Amer: 60 mL/min — ABNORMAL LOW (ref >60)
GFR calc non Af Amer: 52 mL/min — ABNORMAL LOW (ref >60)

## 2019-01-01 LAB — CBC WITH DIFFERENTIAL/PLATELET
Abs Immature Granulocytes: 0.01 10*3/uL (ref 0.00–0.07)
Basophils Absolute: 0.1 10*3/uL (ref 0.0–0.1)
Basophils Relative: 2 %
Eosinophils Absolute: 0.2 10*3/uL (ref 0.0–0.5)
Eosinophils Relative: 4 %
HCT: 47.8 % — ABNORMAL HIGH (ref 36.0–46.0)
Hemoglobin: 15.1 g/dL — ABNORMAL HIGH (ref 12.0–15.0)
Immature Granulocytes: 0 %
Lymphocytes Relative: 38 %
Lymphs Abs: 2.2 10*3/uL (ref 0.7–4.0)
MCH: 28.5 pg (ref 26.0–34.0)
MCHC: 31.6 g/dL (ref 30.0–36.0)
MCV: 90.4 fL (ref 80.0–100.0)
Monocytes Absolute: 0.5 10*3/uL (ref 0.1–1.0)
Monocytes Relative: 9 %
Neutro Abs: 2.7 10*3/uL (ref 1.7–7.7)
Neutrophils Relative %: 47 %
Platelets: 209 10*3/uL (ref 150–400)
RBC: 5.29 MIL/uL — ABNORMAL HIGH (ref 3.87–5.11)
RDW: 13.3 % (ref 11.5–15.5)
WBC: 5.6 10*3/uL (ref 4.0–10.5)
nRBC: 0 % (ref 0.0–0.2)

## 2019-01-01 LAB — CBC
HCT: 46.3 % — ABNORMAL HIGH (ref 36.0–46.0)
Hemoglobin: 15.4 g/dL — ABNORMAL HIGH (ref 12.0–15.0)
MCH: 29.4 pg (ref 26.0–34.0)
MCHC: 33.3 g/dL (ref 30.0–36.0)
MCV: 88.4 fL (ref 80.0–100.0)
Platelets: 191 10*3/uL (ref 150–400)
RBC: 5.24 MIL/uL — ABNORMAL HIGH (ref 3.87–5.11)
RDW: 13.2 % (ref 11.5–15.5)
WBC: 5.3 10*3/uL (ref 4.0–10.5)
nRBC: 0 % (ref 0.0–0.2)

## 2019-01-01 LAB — TROPONIN I
Troponin I: <0.03 ng/mL (ref <0.03)
Troponin I: <0.03 ng/mL (ref <0.03)
Troponin I: <0.03 ng/mL (ref <0.03)

## 2019-01-01 LAB — BASIC METABOLIC PANEL
Anion gap: 12 (ref 5–15)
BUN: 10 mg/dL (ref 8–23)
CO2: 24 mmol/L (ref 22–32)
Calcium: 9.3 mg/dL (ref 8.9–10.3)
Chloride: 103 mmol/L (ref 98–111)
Creatinine, Ser: 0.85 mg/dL (ref 0.44–1.00)
GFR calc Af Amer: >60 mL/min (ref >60)
GFR calc non Af Amer: >60 mL/min (ref >60)
Glucose, Bld: 108 mg/dL — ABNORMAL HIGH (ref 70–99)
Potassium: 3.9 mmol/L (ref 3.5–5.1)
Sodium: 139 mmol/L (ref 135–145)

## 2019-01-01 MED ORDER — NITROGLYCERIN 0.4 MG SL SUBL: 0.4000 mg | SUBLINGUAL_TABLET | SUBLINGUAL | Status: DC | PRN

## 2019-01-01 MED ORDER — HEPARIN SODIUM (PORCINE) 5000 UNIT/ML IJ SOLN
5000.0000 [IU] | Freq: Three times a day (TID) | INTRAMUSCULAR | Status: DC
  Administered 2019-01-01 – 2019-01-02 (×2): 5000 [IU] via SUBCUTANEOUS
  Filled 2019-01-01 (×2): qty 1

## 2019-01-01 MED ORDER — ATORVASTATIN CALCIUM 40 MG PO TABS
40.0000 mg | ORAL_TABLET | Freq: Every day | ORAL | Status: DC
  Filled 2019-01-01: qty 1

## 2019-01-01 MED ORDER — ASPIRIN 81 MG PO CHEW: 81.0000 mg | CHEWABLE_TABLET | Freq: Every day | ORAL | Status: DC

## 2019-01-01 MED ORDER — ASPIRIN EC 81 MG PO TBEC
81.0000 mg | DELAYED_RELEASE_TABLET | Freq: Every day | ORAL | Status: DC
  Filled 2019-01-01: qty 1

## 2019-01-01 MED ORDER — ACETAMINOPHEN 325 MG PO TABS: 650.0000 mg | ORAL_TABLET | ORAL | Status: DC | PRN

## 2019-01-01 MED ORDER — GABAPENTIN 100 MG PO CAPS
200.0000 mg | ORAL_CAPSULE | Freq: Every day | ORAL | Status: DC
  Filled 2019-01-01: qty 2

## 2019-01-01 MED ORDER — TRAZODONE HCL 50 MG PO TABS: 50.0000 mg | ORAL_TABLET | Freq: Every evening | ORAL | Status: DC | PRN

## 2019-01-01 MED ORDER — DULOXETINE HCL 60 MG PO CPEP
90.0000 mg | ORAL_CAPSULE | Freq: Every day | ORAL | Status: DC
  Filled 2019-01-01: qty 1

## 2019-01-01 NOTE — H&P (Signed)
Cardiology Admission History and Physical:   Patient ID: Stacey Lawrence MRN: [id number]; DOB: 1951/06/19   Admission date: 01/01/2019  Primary Care Provider: Celene Squibb, MD Primary Cardiologist: Kate Sable, MD  Primary Electrophysiologist:  None   Chief Complaint: Chest pain  Patient Profile:   Stacey Lawrence is a 68 y.o. female with h/o CAD with SCAD in 2014 of a small second obtuse marginal branch that was treated medically, varicose veins, Raynauds disease, migraine headaches, fibromyalgia, anxiety, depression, IBS and GERD presenting to the emergency department with chief complaint of chest pain.  History of Present Illness:   Stacey Lawrence is followed by Dr. Bronson Ing. 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  She was also found to have a 30% mid LAD lesion at that time.  She had a nuclear stress test performed on 06/04/2013 and was low risk.  Demonstrated old small inferolateral scar with no evidence of ischemia and normal left ventricular systolic function.  She also has a long history of symptomatic varicose veins with pain and swelling with no significant superficial reflux.  She has mild deep reflux.  She has seen vascular surgery and was told the primary therapy is compression stockings. Dr. Oneida Alar discussed the possibility of sclerotherapy but she was not interested.  Additional medical problems include polycythemia vera, Raynauds disease, fibromyalgia, depression, anxiety, IBS, GERD and migraine headaches.  She had a recent outpatient echocardiogram that was ordered by her PCP and done through [facility] on 12/24/2018 for dyspnea.  Echocardiogram showed normal left ventricular EF, 55- 60%, and normal wall motion.  No significant valvular abnormalities and no pericardial effusion.   Echo report is outlined below and was taken from record in [place].  Patient now presents to the [facility] emergency department with chief complaint of chest pain. The pain woke her up from her sleep.  Described as left-sided chest pain, rated 9 out of 10.  Patient has had chest pain off and on for the last 3 months however chest pain last night was more severe and more prolonged and unrelieved with sublingual nitroglycerin.  Patient called the office earlier this morning for notification and was advised to go to the ED for further evaluation.  Also, as outlined above, patient recently was ordered by her PCP to get an echocardiogram for exertional dyspnea.  However, study was unremarkable.  In the ED, EKG shows normal sinus rhythm.  No ischemic abnormalities.  67 bpm.  Point-of-care troponin in the ED is negative x1.  Basic metabolic panel is unremarkable.  CBC shows hemoglobin of 15.1 (history of polycythemia vera). Chest x-ray shows no active cardiopulmonary disease.  Systolic blood pressures in the ED ranging from 120-146. Diastolic pressures 63-84.  Heart rate in the upper 50s low 60s.  She is afebrile.  Oxygen saturations in the upper 90s.  Home cardiac meds include aspirin, atorvastatin and PRN sublingual nitro.  Past Medical History:  Diagnosis Date   Anemia    hx   Anxiety    CAD (coronary artery disease)    a. 12/2012 NSTEMI/Cath: LM anomalous, arising in R cusp ant to RCA, otw nl, LAD 94m with bridge, RI nl, LCX nl, OM2 small, subtl occl w/ thrombus distally - felt to be spont dissection, too small for PCI->Med Rx, RCA large, dom, nl, PDA/PD nl, EF 55% w/ focal  HK in mid-dist antlat wall;  b. 05/2013 Lexi CL: EF 77%, old small inferolat scar, no ischemia.   Common migraine with intractable migraine 06/16/2017   Depression    Fibromyalgia    GERD (gastroesophageal reflux disease)    hasn't started taking any meds   History of blood transfusion    50yrs ago   Hx of colonic  polyps    IBS (irritable bowel syndrome)    constipation   Joint pain    Joint swelling    Kidney stones 03/2010   Migraine    "q other week" (02/04/2016)   Myocardial infarction (HCC)    Numbness    right leg   Polycythemia    PONV (postoperative nausea and vomiting)    Raynaud's disease    Scoliosis    Shortness of breath dyspnea    with exertion    Past Surgical History:  Procedure Laterality Date   ABDOMINAL HYSTERECTOMY     partial   APPENDECTOMY     with explor. lap.   CARDIAC CATHETERIZATION     CHOLECYSTECTOMY  10/28/2002   lap.   COLONOSCOPY     DEBRIDEMENT TENNIS ELBOW     DIAGNOSTIC LAPAROSCOPY     DILATION AND CURETTAGE OF UTERUS     ELBOW SURGERY     golfer's elbow-bilateral   ESOPHAGOGASTRODUODENOSCOPY  09/10/2012   Procedure: ESOPHAGOGASTRODUODENOSCOPY (EGD);  Surgeon: Rogene Houston, MD;  Location: [facility];  Service: Endoscopy;  Laterality: N/A;  730   EXCISION/RELEASE BURSA HIP Left 02/04/2016   Procedure: LEFT HIP TROCHANTERIC BURSECTOMY;  Surgeon: Mcarthur Rossetti, MD;  Location: [place];  Service: Orthopedics;  Laterality: Left;   I&D EXTREMITY  02/25/2012   Procedure: IRRIGATION AND DEBRIDEMENT EXTREMITY;  Surgeon: Roseanne Kaufman, MD;  Location: [place];  Service: Orthopedics;  Laterality: Right;  Irrigation and Debridement and Repair Right Thumb Nerve Laceration and Assoiated Structures    KNEE DEBRIDEMENT     right   LAPAROTOMY     LEFT HEART CATHETERIZATION WITH CORONARY ANGIOGRAM N/A 01/16/2013   Procedure: LEFT HEART CATHETERIZATION WITH CORONARY ANGIOGRAM;  Surgeon: Jolaine Artist, MD;  [facility];  Service: Cardiovascular;  Laterality: N/A;   STAPEDECTOMY     right   TONSILLECTOMY AND ADENOIDECTOMY     TRIGGER FINGER RELEASE  08/19/2011   Procedure: RELEASE TRIGGER FINGER/A-1 PULLEY;  Surgeon: Willa Frater III;  Location: [place];  Service: Orthopedics;  Laterality: Right;   right middle finger a-1 pulley release with tenosynovectomy   TROCHANTERIC BURSA EXCISION Right 02/04/2016   TUBAL LIGATION     TYMPANOPLASTY       Medications Prior to Admission: Prior to Admission medications   Medication Sig Start Date End Date Taking? Authorizing Provider  Ascorbic Acid (VITAMIN C PO) Take 2 tablets by mouth daily.   Yes [provider]  aspirin 81 MG chewable tablet Chew 81 mg by mouth at bedtime.    Yes [provider]  Biotin (BIOTIN 5000) 5 MG CAPS Take 1 capsule by mouth daily.   Yes [provider]  Calcium Citrate-Vitamin D (CALCITRATE/VITAMIN D PO) Take 1 tablet by mouth daily.   Yes [provider]  carisoprodol (SOMA) 350 MG tablet Take 350 mg by mouth daily as needed for muscle spasms.    Yes [provider]  celecoxib (CELEBREX) 200 MG capsule Take 200 mg by mouth daily.    Yes [provider]  DULoxetine (CYMBALTA) 30  MG capsule Take 90 mg daily (60 mg + 30 mg daily) 07/31/18  Yes Hisada, Reina, MD  DULoxetine (CYMBALTA) 60 MG capsule Take 1 capsule (60 mg total) by mouth daily. 11/19/18  Yes Norman Clay, MD  gabapentin (NEURONTIN) 100 MG capsule Take 2 capsules (200 mg total) by mouth 3 (three) times daily. Patient taking differently: Take 200 mg by mouth daily.  12/14/17  Yes Ward Givens, NP  nitroGLYCERIN (NITROSTAT) 0.4 MG SL tablet PLACE 1 TABLET UNDER THE TONGUE EVERY 5 MINUTES AS NEEDED FOR CHEST PAIN Patient taking differently: Place 0.4 mg under the tongue every 5 (five) minutes as needed.  10/17/18  Yes Herminio Commons, MD  promethazine (PHENERGAN) 25 MG tablet Take 25 mg by mouth every 6 (six) hours as needed for nausea or vomiting.   Yes [provider]  traZODone (DESYREL) 50 MG tablet Take 1 tablet (50 mg total) by mouth at bedtime as needed for sleep. 11/14/18  Yes Hisada, Elie Goody, MD  atorvastatin (LIPITOR) 40 MG tablet TAKE 1 TABLET BY MOUTH DAILY. Patient not taking: No sig  reported 12/26/17   Herminio Commons, MD     Allergies:    Allergies  Allergen Reactions   Dilaudid [Hydromorphone Hcl] Other (See Comments)    Resp. arrest   Codeine Nausea And Vomiting and Rash   Morphine And Related Nausea And Vomiting    Social History:   Social History   Socioeconomic History   Marital status: Married    Spouse name: Not on file   Number of children: 2   Years of education: 14   Highest education level: Not on file  Occupational History   Not on file  Social Needs   Financial resource strain: Not on file   Food insecurity:    Worry: Not on file    Inability: Not on file   Transportation needs:    Medical: Not on file    Non-medical: Not on file  Tobacco Use   Smoking status: Never Smoker   Smokeless tobacco: Never Used  Substance and Sexual Activity   Alcohol use: No    Alcohol/week: 0.0 standard drinks   Drug use: No   Sexual activity: Not Currently    Birth control/protection: Surgical  Lifestyle   Physical activity:    Days per week: Not on file    Minutes per session: Not on file   Stress: Not on file  Relationships   Social connections:    Talks on phone: Not on file    Gets together: Not on file    Attends religious service: Not on file    Active member of club or organization: Not on file    Attends meetings of clubs or organizations: Not on file    Relationship status: Not on file   Intimate partner violence:    Fear of current or ex partner: Not on file    Emotionally abused: Not on file    Physically abused: Not on file    Forced sexual activity: Not on file  Other Topics Concern   Not on file  Social History Narrative   Lives in [place] with husband.     Grown children.     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   Caffeine use: none    Family History:   The patient's family history includes ADD / ADHD in her son; COPD in her mother; Cancer in her father and mother;  Depression in her brother, father, and mother; Heart disease in her father and mother; Heart failure in her mother; Varicose Veins in her father and mother.    ROS:  Please see the history of present illness.  All other ROS reviewed and negative.     Physical Exam/Data:   Vitals:   01/01/19 1030 01/01/19 1100 01/01/19 1130 01/01/19 1200  BP: 124/70 120/70 121/74 132/72  Pulse: 66 75 62 63  Resp: 15 14 12 10   Temp:      TempSrc:      SpO2: 100% 100% 98% 99%  Weight:      Height:       No intake or output data in the 24 hours ending 01/01/19 1211 Last 3 Weights 01/01/2019 07/02/2018 06/26/2018  Weight (lbs) 158 lb 155 lb 155 lb 8 oz  Weight (kg) 71.668 kg 70.308 kg 70.534 kg  Some encounter information is confidential and restricted. Go to Review Flowsheets activity to see all data.     Body mass index is 24.38 kg/m.  General:  Well nourished, well developed, in no acute distress, resting comfortably in bed HEENT: normal Lymph: no adenopathy Neck: no JVD Endocrine:  No thryomegaly Vascular: No carotid bruits; FA pulses 2+ bilaterally   Cardiac:  normal S1, S2; RRR; no murmur  Lungs:  clear to auscultation bilaterally, no wheezing, rhonchi or rales  Abd: soft, nontender, no hepatomegaly  Ext: no edema Musculoskeletal:  No deformities, BUE and BLE strength normal and equal Skin: warm and dry  Neuro:  CNs 2-12 intact, no focal abnormalities noted Psych:  Normal affect    EKG:  The ECG that was done 01/01/2019 was personally reviewed and demonstrates normal sinus rhythm.  67 bpm.  No ischemic abnormalities.  No significant ST/T wave changes  Relevant CV Studies:  Left heart catheterization 01/16/2013 Findings:  Ao Pressure: 129/78 (102) LV Pressure:  126/2/8 There was no signficant gradient across the aortic valve on pullback.  Left main: Anomalous. Arising in the right cusp anterior to RCA origin. Long. No stenosis. Gives off LAD, Ramus and LCX.   LAD:  Gives off 2  diagonals. There is an intramyocardial segment (bridge) in the mid LAD with 30% plaque. Otherwise normal.  RAMUS: Normal  LCX:  Gives off small OM-1, OM-2 and OM-3. OM-2 is a very small caliber vessel which extends out to the distal anterolateral wall and apex. In the middle to distal OM-2 the vessel is subtotally occluded with thrombus  RCA: Large dominant vessel gives off large PDA and PL.  LV-gram done in the RAO projection: Ejection fraction = 55% focal area of hypokinesis in mid to distal anterolateral wall  Assessment: 1. CAD with occlusion of a small OM-2 2. 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 EF 55% with focal area of hypokinesis in mid to distal anterolateral wall  Plan/Discussion:  The OM-2 is too small for PCI. Will treat medically with aggressive RF modification. Start Plavix. ______________________________  2D echocardiogram 12/24/2018 at [facility] (outpatient echo) SUMMARY  Injection of agitated saline contrast performed to evaluate for possible shunt  The left ventricular size is normal.    Left ventricular systolic function is normal.  LV ejection fraction = 55-60%.    Left ventricular filling pattern is prolonged relaxation.  No segmental wall motion abnormalities seen in the left ventricle  The right ventricle is normal in size and function.  Injection of agitated saline showed no right-to-left  shunt.  There is mild mitral regurgitation.  The aortic sinus is normal size.  The IVC is normal in size with an inspiratory collapse of greater then 50%,   suggesting normal right atrial pressure.  There is no pericardial effusion.  There is no comparison study available.  -  FINDINGS:    LEFT VENTRICLE  The left ventricular size is normal. There is normal left ventricular wall   thickness. Left ventricular systolic function is normal. LV ejection fraction   = 55-60%.  Left ventricular filling pattern is prolonged relaxation. No   segmental wall motion abnormalities seen in the left ventricle.   Laboratory Data:  Chemistry Recent Labs  Lab 01/01/19 0950  NA 139  K 3.9  CL 103  CO2 24  GLUCOSE 108*  BUN 10  CREATININE 0.85  CALCIUM 9.3  GFRNONAA >60  GFRAA >60  ANIONGAP 12    No results for input(s): PROT, ALBUMIN, AST, ALT, ALKPHOS, BILITOT in the last 168 hours. Hematology Recent Labs  Lab 01/01/19 0950  WBC 5.6  RBC 5.29*  HGB 15.1*  HCT 47.8*  MCV 90.4  MCH 28.5  MCHC 31.6  RDW 13.3  PLT 209   Cardiac Enzymes Recent Labs  Lab 01/01/19 0950  TROPONINI <0.03   No results for input(s): TROPIPOC in the last 168 hours.  BNPNo results for input(s): BNP, PROBNP in the last 168 hours.  DDimer No results for input(s): DDIMER in the last 168 hours.  Radiology/Studies:  Dg Chest 2 View  Result Date: 01/01/2019 CLINICAL DATA:  Chest pain. EXAM: CHEST - 2 VIEW COMPARISON:  Radiographs of December 09, 2016. FINDINGS: The heart size and mediastinal contours are within normal limits. Both lungs are clear. No pneumothorax or pleural effusion is noted. The visualized skeletal structures are unremarkable. IMPRESSION: No active cardiopulmonary disease. Electronically Signed   By: Marijo Conception M.D.   On: 01/01/2019 10:17    Assessment and Plan:   Stacey Lawrence is a 68 y.o. female with h/o CAD with SCAD in 2014 of a small second obtuse marginal branch that was treated medically, varicose veins, Raynauds disease, migraine headaches, fibromyalgia, anxiety, depression, IBS and GERD presenting to the emergency department with chief complaint of chest pain.  1. 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  She was also noted at that time to have a 30% mid LAD lesion.  She had an  episode of chest pain early morning hours waking her from her sleep and unrelieved with sublingual nitroglycerin.  Initial point-of-care troponin in the ED is negative.  EKG shows normal sinus rhythm with no acute ST/T wave abnormalities. CXR unremarkable. Blood pressure and heart rate both within normal limits.  Patient also has had recent exertional dyspnea and had echocardiogram ordered by her PCP, done just last week, which showed normal left ventricular systolic function with an EF of 55 to 60%, normal wall motion and no significant valvular abnormalities.  Given her history, would recommend overnight observation and will cycle cardiac enzymes x3 for complete rule out.  Also given her history of vasospastic disorders including Raynaud disease and migraine headaches, will also need to include the possibility of coronary vasospasm. Also include GERD in the differential diagnosis (try trial of PPI and GI cocktail).  Will make n.p.o. at midnight for coronary CTA in the a.m.  Dr. Oval Linsey to read study tomorrow. If nonobstructive CAD is noted on coronary CTA, would consider addition of a low dose CCB for possible coronary vasospasm.     For questions or updates, please contact [place] Please consult [email or web address] for contact info under   Signed, Lyda Jester, PA-C  01/01/2019 12:11 PM   Patient seen, examined. Available data reviewed. Agree with findings, assessment, and plan as outlined by Lyda Jester, PA-C.  The physical exam findings documented above reflect my personal findings from examination of this patient today.  I agree with the documentation above.  The patient has had chronic intermittent chest pain with notable progression of her chest pain symptoms over the last 6 months.  She has had increasing stress at home and her chest pain has worsened over the last several days.  She was trying to wait to see Dr. Aviva Signs next week, but with worsening chest pain she presented to  the emergency department today.  The patient is chest pain-free at the time of my evaluation.  Troponin is negative.  EKG has no diagnostic changes.  I reviewed her cardiac catheterization study from 2014 and she had small vessel occlusion of an obtuse marginal branch with characteristics of spontaneous coronary artery dissection.  There is a note of mild nonobstructive LAD stenosis but this was likely related to intramyocardial bridging.  Considering her worsening anginal symptoms, it seems reasonable to proceed with further cardiac evaluation.  I have recommended a gated coronary CTA study to evaluate for any high-grade obstructive disease in the proximal epicardial vessels.  This will be scheduled for tomorrow.  We will request an 18-gauge antecubital IV for contrast administration.  I do not think her symptoms warrant unfractionated heparin in the setting of an essentially normal EKG and negative troponins.  Will rule out for MI with serial cardiac enzymes.  The patient will be admitted under observation status.  Sherren Mocha, M.D. 01/01/2019 3:50 PM

## 2019-01-01 NOTE — Telephone Encounter (Signed)
Patient called to say she was awakened from sleep last nightwith left sided CP. Rated 8-9/10.Took 3 NTG, still has pain and "sweats" and dizziness. She says this her her typical angina but it has never lasted that long. I advised her to go to the ED for evaluation. She states she is going to [facility].I will FYI Dr.Koneswaran

## 2019-01-01 NOTE — ED Provider Notes (Signed)
[facility] EMERGENCY DEPARTMENT Provider Note   CSN: [id number] Arrival date & time: 01/01/19  0913    History   Chief Complaint Chief Complaint  Patient presents with  . Chest Pain    HPI Stacey Lawrence is a 68 y.o. female.     HPI Patient presents to the emergency department with chest pain that is been ongoing for several months but is gotten worse over the last week.  Patient states this feels like her anginal symptoms.  The patient states that she is been taking nitroglycerin without relief of her symptoms.  Patient states she did take some aspirin as well today.  She states today's episode seemed to be worse than last longer.  Patient states she has had some shortness of breath along with fatigue as well.  The patient denies headache,blurred vision, neck pain, fever, cough, weakness, numbness, dizziness, anorexia, edema, abdominal pain, nausea, vomiting, diarrhea, rash, back pain, dysuria, hematemesis, bloody stool, near syncope, or syncope. Past Medical History:  Diagnosis Date  . Anemia    hx  . Anxiety   . CAD (coronary artery disease)    a. 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 05/2013 Lexi CL: EF 77%, old small inferolat scar, no ischemia.  . Common migraine with intractable migraine 06/16/2017  . Depression   . Fibromyalgia   . GERD (gastroesophageal reflux disease)    hasn't started taking any meds  . History of blood transfusion    65yrs ago  . Hx of colonic polyps   . IBS (irritable bowel syndrome)    constipation  . Joint pain   . Joint swelling   . Kidney stones 03/2010  . Migraine    "q other week" (02/04/2016)  . Myocardial infarction ([place])   . Numbness    right leg  . Polycythemia   .  PONV (postoperative nausea and vomiting)   . Raynaud's disease   . Scoliosis   . Shortness of breath dyspnea    with exertion    Patient Active Problem List   Diagnosis Date Noted  . Common migraine with intractable migraine 06/16/2017  . Tremor 11/16/2016  . Trochanteric bursitis of left hip 02/04/2016  . Trochanteric bursitis of both hips 10/31/2015  . Chest pain at rest 06/17/2015  . Inferior myocardial infarction ([place]) 06/17/2015  . Sprain of interphalangeal joint of left little finger 02/05/2014  . Pain in joint, hand 02/05/2014  . Midsternal chest pain 06/04/2013  . CAD (coronary artery disease)   . Fibromyalgia   . GERD (gastroesophageal reflux disease)   . Migraine headache   . Acute non-STEMI < 8 weeks prior, subsequent admission after initial 01/16/2013  . Radial nerve laceration 03/27/2012  . Lack of coordination 03/27/2012  . Muscle weakness (generalized) 03/27/2012    Past Surgical History:  Procedure Laterality Date  . ABDOMINAL HYSTERECTOMY     partial  . APPENDECTOMY     with explor. lap.  Marland Kitchen CARDIAC CATHETERIZATION    . CHOLECYSTECTOMY  10/28/2002   lap.  . COLONOSCOPY    . DEBRIDEMENT TENNIS ELBOW    . DIAGNOSTIC LAPAROSCOPY    . DILATION AND CURETTAGE OF UTERUS    . ELBOW  SURGERY     golfer's elbow-bilateral  . ESOPHAGOGASTRODUODENOSCOPY  09/10/2012   Procedure: ESOPHAGOGASTRODUODENOSCOPY (EGD);  Surgeon: Rogene Houston, MD;  [facility];  Service: Endoscopy;  Laterality: N/A;  730  . EXCISION/RELEASE BURSA HIP Left 02/04/2016   Procedure: LEFT HIP TROCHANTERIC BURSECTOMY;  Surgeon: Mcarthur Rossetti, MD;  Location: [facility];  Service: Orthopedics;  Laterality: Left;  . I&D EXTREMITY  02/25/2012   Procedure: IRRIGATION AND DEBRIDEMENT EXTREMITY;  Surgeon: Roseanne Kaufman, MD;  Location: [place];  Service: Orthopedics;  Laterality: Right;  Irrigation and Debridement and Repair Right Thumb Nerve Laceration and Assoiated Structures   . KNEE  DEBRIDEMENT     right  . LAPAROTOMY    . LEFT HEART CATHETERIZATION WITH CORONARY ANGIOGRAM N/A 01/16/2013   Procedure: LEFT HEART CATHETERIZATION WITH CORONARY ANGIOGRAM;  Surgeon: Jolaine Artist, MD;  Location: [facility] LAB;  Service: Cardiovascular;  Laterality: N/A;  . STAPEDECTOMY     right  . TONSILLECTOMY AND ADENOIDECTOMY    . TRIGGER FINGER RELEASE  08/19/2011   Procedure: RELEASE TRIGGER FINGER/A-1 PULLEY;  Surgeon: Willa Frater III;  Location: [place];  Service: Orthopedics;  Laterality: Right;  right middle finger a-1 pulley release with tenosynovectomy  . TROCHANTERIC BURSA EXCISION Right 02/04/2016  . TUBAL LIGATION    . TYMPANOPLASTY       OB History   No obstetric history on file.      Home Medications    Prior to Admission medications   Medication Sig Start Date End Date Taking? Authorizing Provider  Ascorbic Acid (VITAMIN C PO) Take 2 tablets by mouth daily.   Yes [provider]  aspirin 81 MG chewable tablet Chew 81 mg by mouth at bedtime.    Yes [provider]  Biotin (BIOTIN 5000) 5 MG CAPS Take 1 capsule by mouth daily.   Yes [provider]  Calcium Citrate-Vitamin D (CALCITRATE/VITAMIN D PO) Take 1 tablet by mouth daily.   Yes [provider]  carisoprodol (SOMA) 350 MG tablet Take 350 mg by mouth daily as needed for muscle spasms.    Yes [provider]  celecoxib (CELEBREX) 200 MG capsule Take 200 mg by mouth daily.    Yes [provider]  DULoxetine (CYMBALTA) 30 MG capsule Take 90 mg daily (60 mg + 30 mg daily) 07/31/18  Yes Hisada, Reina, MD  DULoxetine (CYMBALTA) 60 MG capsule Take 1 capsule (60 mg total) by mouth daily. 11/19/18  Yes Norman Clay, MD  gabapentin (NEURONTIN) 100 MG capsule Take 2 capsules (200 mg total) by mouth 3 (three) times daily. Patient taking differently: Take 200 mg by mouth daily.  12/14/17  Yes Ward Givens, NP  nitroGLYCERIN (NITROSTAT) 0.4 MG SL  tablet PLACE 1 TABLET UNDER THE TONGUE EVERY 5 MINUTES AS NEEDED FOR CHEST PAIN Patient taking differently: Place 0.4 mg under the tongue every 5 (five) minutes as needed.  10/17/18  Yes Herminio Commons, MD  promethazine (PHENERGAN) 25 MG tablet Take 25 mg by mouth every 6 (six) hours as needed for nausea or vomiting.   Yes [provider]  traZODone (DESYREL) 50 MG tablet Take 1 tablet (50 mg total) by mouth at bedtime as needed for sleep. 11/14/18  Yes Hisada, Elie Goody, MD  atorvastatin (LIPITOR) 40 MG tablet TAKE 1 TABLET BY MOUTH DAILY. Patient not taking: No sig reported 12/26/17   Herminio Commons, MD    Family History Family History  Problem Relation  Age of Onset  . Cancer Mother        Deceased with lung CA  . Heart failure Mother   . Heart disease Mother   . COPD Mother   . Varicose Veins Mother   . Depression Mother   . Cancer Father        Deceased with lung CA  . Heart disease Father   . Varicose Veins Father   . Depression Father   . Depression Brother   . ADD / ADHD Son     Social History Social History   Tobacco Use  . Smoking status: Never Smoker  . Smokeless tobacco: Never Used  Substance Use Topics  . Alcohol use: No    Alcohol/week: 0.0 standard drinks  . Drug use: No     Allergies   Dilaudid [hydromorphone hcl]; Codeine; and Morphine and related   Review of Systems Review of Systems All other systems negative except as documented in the HPI. All pertinent positives and negatives as reviewed in the HPI.  Physical Exam Updated Vital Signs BP 126/71   Pulse 64   Temp 97.9 F (36.6 C) (Oral)   Resp 15   Ht 5' 7.5" (1.715 m)   Wt 71.7 kg   SpO2 98%   BMI 24.38 kg/m   Physical Exam Vitals signs and nursing note reviewed.  Constitutional:      General: She is not in acute distress.    Appearance: She is well-developed.  HENT:     Head: Normocephalic and atraumatic.  Eyes:     Pupils: Pupils are equal, round, and reactive to  light.  Neck:     Musculoskeletal: Normal range of motion and neck supple.  Cardiovascular:     Rate and Rhythm: Normal rate and regular rhythm.     Heart sounds: Normal heart sounds. No murmur. No friction rub. No gallop.   Pulmonary:     Effort: Pulmonary effort is normal. No respiratory distress.     Breath sounds: Normal breath sounds. No wheezing or rhonchi.  Abdominal:     General: Bowel sounds are normal. There is no distension.     Palpations: Abdomen is soft.     Tenderness: There is no abdominal tenderness.  Skin:    General: Skin is warm and dry.     Capillary Refill: Capillary refill takes less than 2 seconds.     Findings: No erythema or rash.  Neurological:     Mental Status: She is alert and oriented to person, place, and time.     Motor: No abnormal muscle tone.     Coordination: Coordination normal.  Psychiatric:        Behavior: Behavior normal.      ED Treatments / Results  Labs (all labs ordered are listed, but only abnormal results are displayed) Labs Reviewed  BASIC METABOLIC PANEL - Abnormal; Notable for the following components:      Result Value   Glucose, Bld 108 (*)    All other components within normal limits  CBC WITH DIFFERENTIAL/PLATELET - Abnormal; Notable for the following components:   RBC 5.29 (*)    Hemoglobin 15.1 (*)    HCT 47.8 (*)    All other components within normal limits  TROPONIN I  TROPONIN I  TROPONIN I  TROPONIN I    EKG EKG Interpretation  Date/Time:  Tuesday January 01 2019 09:23:13 EDT Ventricular Rate:  67 PR Interval:    QRS Duration: 102 QT Interval:  388  QTC Calculation: 410 R Axis:   28 Text Interpretation:  Sinus rhythm Borderline prolonged PR interval Confirmed by Gerlene Fee [id number]) on 01/01/2019 11:09:33 AM   Radiology Dg Chest 2 View  Result Date: 01/01/2019 CLINICAL DATA:  Chest pain. EXAM: CHEST - 2 VIEW COMPARISON:  Radiographs of December 09, 2016. FINDINGS: The heart size and mediastinal  contours are within normal limits. Both lungs are clear. No pneumothorax or pleural effusion is noted. The visualized skeletal structures are unremarkable. IMPRESSION: No active cardiopulmonary disease. Electronically Signed   By: Marijo Conception M.D.   On: 01/01/2019 10:17    Procedures Procedures (including critical care time)  Medications Ordered in ED Medications - No data to display   Initial Impression / Assessment and Plan / ED Course  I have reviewed the triage vital signs and the nursing notes.  Pertinent labs & imaging results that were available during my care of the patient were reviewed by me and considered in my medical decision making (see chart for details).        I spoke with cardiology who will evaluate the patient for admission for her unstable angina.  Patient is advised of the plan and all questions were answered.  Final Clinical Impressions(s) / ED Diagnoses   Final diagnoses:  Unstable angina [facility])    ED Discharge Orders    None       Dalia Heading, PA-C 01/01/19 1502    Maudie Flakes, MD 01/02/19 [phone]

## 2019-01-01 NOTE — ED Notes (Signed)
Patient transported to X-ray 

## 2019-01-01 NOTE — ED Notes (Addendum)
ED TO INPATIENT HANDOFF REPORT  ED Nurse Name and Phone #: Caprice Kluver [phone]  S Name/Age/Gender Stacey Lawrence 68 y.o. female Room/Bed: 027C/027C  Code Status   Code Status: Prior  Home/SNF/Other Home Patient oriented to: self, place, time and situation Is this baseline? Yes   Triage Complete: Triage complete  Chief Complaint CP  Triage Note Pt reports having CP for about 3 months. Pt states she sees a cardiologist and was told to come here. Pt endorses taking 3 nitro and excredrin today. Pt states she has been taking nitro every couple days for the past 3 months.    Allergies Allergies  Allergen Reactions  . Dilaudid [Hydromorphone Hcl] Other (See Comments)    Resp. arrest  . Codeine Nausea And Vomiting and Rash  . Morphine And Related Nausea And Vomiting    Level of Care/Admitting Diagnosis ED Disposition    ED Disposition Condition Comment   Admit  Hospital Area: [facility] [[id number]]  Level of Care: Telemetry Cardiac [103]  Diagnosis: Chest pain with moderate risk for cardiac etiology [277412]  Admitting Physician: [place], [place]  Attending Physician: Sherren Mocha [[id number]]  Possible Covid Disease Patient Isolation: N/A  PT Class (Do Not Modify): Observation [104]  PT Acc Code (Do Not Modify): Observation [10022]       B Medical/Surgery History Past Medical History:  Diagnosis Date  . Anemia    hx  . Anxiety   . CAD (coronary artery disease)    a. 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 05/2013 Lexi CL: EF 77%, old small inferolat scar, no ischemia.  . Common migraine with intractable migraine 06/16/2017  . Depression   . Fibromyalgia   . GERD (gastroesophageal reflux disease)     hasn't started taking any meds  . History of blood transfusion    89yrs ago  . Hx of colonic polyps   . IBS (irritable bowel syndrome)    constipation  . Joint pain   . Joint swelling   . Kidney stones 03/2010  . Migraine    "q other week" (02/04/2016)  . Myocardial infarction ([place])   . Numbness    right leg  . Polycythemia   . PONV (postoperative nausea and vomiting)   . Raynaud's disease   . Scoliosis   . Shortness of breath dyspnea    with exertion   Past Surgical History:  Procedure Laterality Date  . ABDOMINAL HYSTERECTOMY     partial  . APPENDECTOMY     with explor. lap.  Marland Kitchen CARDIAC CATHETERIZATION    . CHOLECYSTECTOMY  10/28/2002   lap.  . COLONOSCOPY    . DEBRIDEMENT TENNIS ELBOW    . DIAGNOSTIC LAPAROSCOPY    . DILATION AND CURETTAGE OF UTERUS    . ELBOW SURGERY     golfer's elbow-bilateral  . ESOPHAGOGASTRODUODENOSCOPY  09/10/2012   Procedure: ESOPHAGOGASTRODUODENOSCOPY (EGD);  Surgeon: Rogene Houston, MD;  [facility];  Service: Endoscopy;  Laterality: N/A;  730  . EXCISION/RELEASE BURSA HIP Left 02/04/2016   Procedure: LEFT HIP TROCHANTERIC BURSECTOMY;  Surgeon: Mcarthur Rossetti, MD;  Location: [place];  Service: Orthopedics;  Laterality: Left;  . I&D EXTREMITY  02/25/2012  Procedure: IRRIGATION AND DEBRIDEMENT EXTREMITY;  Surgeon: Roseanne Kaufman, MD;  Location: [facility];  Service: Orthopedics;  Laterality: Right;  Irrigation and Debridement and Repair Right Thumb Nerve Laceration and Assoiated Structures   . KNEE DEBRIDEMENT     right  . LAPAROTOMY    . LEFT HEART CATHETERIZATION WITH CORONARY ANGIOGRAM N/A 01/16/2013   Procedure: LEFT HEART CATHETERIZATION WITH CORONARY ANGIOGRAM;  Surgeon: Jolaine Artist, MD;  Location: [facility] CATH LAB;  Service: Cardiovascular;  Laterality: N/A;  . STAPEDECTOMY     right  . TONSILLECTOMY AND ADENOIDECTOMY    . TRIGGER FINGER RELEASE  08/19/2011   Procedure: RELEASE TRIGGER FINGER/A-1 PULLEY;  Surgeon: Willa Frater III;  Location: [place];  Service: Orthopedics;  Laterality: Right;  right middle finger a-1 pulley release with tenosynovectomy  . TROCHANTERIC BURSA EXCISION Right 02/04/2016  . TUBAL LIGATION    . TYMPANOPLASTY       A IV Location/Drains/Wounds Patient Lines/Drains/Airways Status   Active Line/Drains/Airways    Name:   Placement date:   Placement time:   Site:   Days:   Peripheral IV 01/01/19 Left Antecubital   01/01/19    0925    Antecubital   less than 1   Incision (Closed) 02/04/16 Hip Left   02/04/16    1048     1062          Intake/Output Last 24 hours No intake or output data in the 24 hours ending 01/01/19 1547  Labs/Imaging Results for orders placed or performed during the hospital encounter of 01/01/19 (from the past 48 hour(s))  Basic metabolic panel     Status: Abnormal   Collection Time: 01/01/19  9:50 AM  Result Value Ref Range   Sodium 139 135 - 145 mmol/L   Potassium 3.9 3.5 - 5.1 mmol/L   Chloride 103 98 - 111 mmol/L   CO2 24 22 - 32 mmol/L   Glucose, Bld 108 (H) 70 - 99 mg/dL   BUN 10 8 - 23 mg/dL   Creatinine, Ser 0.85 0.44 - 1.00 mg/dL   Calcium 9.3 8.9 - 10.3 mg/dL   GFR calc non Af Amer >60 >60 mL/min   GFR calc Af Amer >60 >60 mL/min   Anion gap 12 5 - 15    Comment: Performed at [facility], [place] [address] [place],  [address]  CBC with Differential     Status: Abnormal   Collection Time: 01/01/19  9:50 AM  Result Value Ref Range   WBC 5.6 4.0 - 10.5 K/uL   RBC 5.29 (H) 3.87 - 5.11 MIL/uL   Hemoglobin 15.1 (H) 12.0 - 15.0 g/dL   HCT 47.8 (H) 36.0 - 46.0 %   MCV 90.4 80.0 - 100.0 fL   MCH 28.5 26.0 - 34.0 pg   MCHC 31.6 30.0 - 36.0 g/dL   RDW 13.3 11.5 - 15.5 %   Platelets 209 150 - 400 K/uL   nRBC 0.0 0.0 - 0.2 %   Neutrophils Relative % 47 %   Neutro Abs 2.7 1.7 - 7.7 K/uL   Lymphocytes Relative 38 %   Lymphs Abs 2.2 0.7 - 4.0 K/uL   Monocytes Relative 9 %   Monocytes Absolute 0.5 0.1 - 1.0 K/uL    Eosinophils Relative 4 %   Eosinophils Absolute 0.2 0.0 - 0.5 K/uL   Basophils Relative 2 %   Basophils Absolute 0.1 0.0 - 0.1 K/uL   Immature Granulocytes 0 %  Abs Immature Granulocytes 0.01 0.00 - 0.07 K/uL    Comment: Performed at [facility], [place] [address] [place], [place] [address]  Troponin I - Once     Status: None   Collection Time: 01/01/19  9:50 AM  Result Value Ref Range   Troponin I <0.03 <0.03 ng/mL    Comment: Performed at [place] [facility] [address], [place], [place] [address]  Troponin I - Now Then Q6H     Status: None   Collection Time: 01/01/19  3:02 PM  Result Value Ref Range   Troponin I <0.03 <0.03 ng/mL    Comment: Performed at [facility] [address], [place], [place] [address]   Dg Chest 2 View  Result Date: 01/01/2019 CLINICAL DATA:  Chest pain. EXAM: CHEST - 2 VIEW COMPARISON:  Radiographs of December 09, 2016. FINDINGS: The heart size and mediastinal contours are within normal limits. Both lungs are clear. No pneumothorax or pleural effusion is noted. The visualized skeletal structures are unremarkable. IMPRESSION: No active cardiopulmonary disease. Electronically Signed   By: Marijo Conception M.D.   On: 01/01/2019 10:17    Pending Labs Unresulted Labs (From admission, onward)    Start     Ordered   01/01/19 1435  Troponin I - Now Then Q6H  Now then every 6 hours,   R     01/01/19 1435   Signed and Held  HIV antibody (Routine Testing)  Once,   R     Signed and Held   Signed and Held  CBC  (heparin)  Once,   R    Comments:  Baseline for heparin therapy IF NOT ALREADY DRAWN.  Notify MD if PLT < 100 K.    Signed and Held   Signed and Held  Creatinine, serum  (heparin)  Once,   R    Comments:  Baseline for heparin therapy IF NOT ALREADY DRAWN.    Signed and Held   Signed and Held  Basic metabolic panel  Tomorrow morning,   R     Signed and Held   Signed and Held  Lipid panel  Tomorrow morning,   R     Signed and Held   Signed  and Held  CBC  Tomorrow morning,   R     Signed and Held          Vitals/Pain Today's Vitals   01/01/19 1330 01/01/19 1400 01/01/19 1430 01/01/19 1541  BP: 120/70 123/70 126/71 127/69  Pulse: 65 69 64 68  Resp: 17 11 15 12   Temp:    98.3 F (36.8 C)  TempSrc:    Oral  SpO2: 99% 98% 98% 99%  Weight:      Height:      PainSc:        Isolation Precautions No active isolations  Medications Medications - No data to display  Mobility walks Moderate fall risk   Focused Assessments Cardiac Assessment Handoff:  Cardiac Rhythm: Normal sinus rhythm Lab Results  Component Value Date   CKTOTAL 69 07/16/2014   CKMB <0.7 07/16/2014   TROPONINI <0.03 01/01/2019   Lab Results  Component Value Date   DDIMER <0.27 06/03/2013   Does the Patient currently have chest pain? Yes  Pt reports her chest feels tight, but not as severe as earlier    R Recommendations: See Admitting Provider Note  Report given to:   Additional Notes:

## 2019-01-01 NOTE — Telephone Encounter (Signed)
Thank you for letting me know. I will await ED evaluation.

## 2019-01-01 NOTE — ED Triage Notes (Signed)
Pt reports having CP for about 3 months. Pt states she sees a cardiologist and was told to come here. Pt endorses taking 3 nitro and excredrin today. Pt states she has been taking nitro every couple days for the past 3 months.

## 2019-01-02 ENCOUNTER — Observation Stay (HOSPITAL_COMMUNITY): Payer: Medicare Other

## 2019-01-02 DIAGNOSIS — I25110 Atherosclerotic heart disease of native coronary artery with unstable angina pectoris: Secondary | ICD-10-CM | POA: Diagnosis not present

## 2019-01-02 DIAGNOSIS — I700 Atherosclerosis of aorta: Secondary | ICD-10-CM | POA: Diagnosis not present

## 2019-01-02 DIAGNOSIS — R079 Chest pain, unspecified: Secondary | ICD-10-CM | POA: Diagnosis not present

## 2019-01-02 LAB — CBC
HCT: 47.4 % — ABNORMAL HIGH (ref 36.0–46.0)
Hemoglobin: 15.4 g/dL — ABNORMAL HIGH (ref 12.0–15.0)
MCH: 28.6 pg (ref 26.0–34.0)
MCHC: 32.5 g/dL (ref 30.0–36.0)
MCV: 87.9 fL (ref 80.0–100.0)
Platelets: 227 10*3/uL (ref 150–400)
RBC: 5.39 MIL/uL — ABNORMAL HIGH (ref 3.87–5.11)
RDW: 13.2 % (ref 11.5–15.5)
WBC: 7.5 10*3/uL (ref 4.0–10.5)
nRBC: 0 % (ref 0.0–0.2)

## 2019-01-02 LAB — BASIC METABOLIC PANEL
Anion gap: 9 (ref 5–15)
BUN: 17 mg/dL (ref 8–23)
CO2: 23 mmol/L (ref 22–32)
Calcium: 8.8 mg/dL — ABNORMAL LOW (ref 8.9–10.3)
Chloride: 104 mmol/L (ref 98–111)
Creatinine, Ser: 0.9 mg/dL (ref 0.44–1.00)
GFR calc Af Amer: >60 mL/min (ref >60)
GFR calc non Af Amer: >60 mL/min (ref >60)
Glucose, Bld: 100 mg/dL — ABNORMAL HIGH (ref 70–99)
Potassium: 6 mmol/L — ABNORMAL HIGH (ref 3.5–5.1)
Sodium: 136 mmol/L (ref 135–145)

## 2019-01-02 LAB — LIPID PANEL
Cholesterol: 176 mg/dL (ref 0–200)
HDL: 80 mg/dL (ref >40)
LDL Cholesterol: 85 mg/dL (ref 0–99)
Total CHOL/HDL Ratio: 2.2 RATIO
Triglycerides: 53 mg/dL (ref <150)
VLDL: 11 mg/dL (ref 0–40)

## 2019-01-02 LAB — POTASSIUM: Potassium: 4.1 mmol/L (ref 3.5–5.1)

## 2019-01-02 LAB — TROPONIN I: Troponin I: <0.03 ng/mL (ref <0.03)

## 2019-01-02 LAB — HIV ANTIBODY (ROUTINE TESTING W REFLEX): HIV Screen 4th Generation wRfx: NONREACTIVE

## 2019-01-02 MED ORDER — NITROGLYCERIN 0.4 MG SL SUBL
0.8000 mg | SUBLINGUAL_TABLET | Freq: Once | SUBLINGUAL | Status: AC
  Administered 2019-01-02: 09:00:00 0.8 mg via SUBLINGUAL

## 2019-01-02 MED ORDER — NITROGLYCERIN 0.4 MG SL SUBL
SUBLINGUAL_TABLET | SUBLINGUAL | Status: AC
  Filled 2019-01-02: qty 2

## 2019-01-02 MED ORDER — PROMETHAZINE HCL 25 MG/ML IJ SOLN
12.5000 mg | Freq: Four times a day (QID) | INTRAMUSCULAR | Status: DC | PRN
  Administered 2019-01-02: 12.5 mg via INTRAVENOUS

## 2019-01-02 MED ORDER — DILTIAZEM HCL ER COATED BEADS 120 MG PO CP24: 120.0000 mg | ORAL_CAPSULE | Freq: Every day | ORAL | 5 refills | Status: DC

## 2019-01-02 MED ORDER — DILTIAZEM HCL ER COATED BEADS 120 MG PO CP24: 120.0000 mg | ORAL_CAPSULE | Freq: Every day | ORAL | Status: DC

## 2019-01-02 MED ORDER — IOHEXOL 350 MG/ML SOLN
80.0000 mL | Freq: Once | INTRAVENOUS | Status: AC | PRN
  Administered 2019-01-02: 80 mL via INTRAVENOUS

## 2019-01-02 MED ORDER — ACETAMINOPHEN 325 MG PO TABS: 650.0000 mg | ORAL_TABLET | ORAL | Status: DC | PRN

## 2019-01-02 NOTE — Progress Notes (Signed)
The patient's coronary CTA scan shows no high-grade obstructive coronary disease.  I discussed the findings with her in follow-up over the telephone.  Her migraine headache is improving.  She is having no chest pain.  I think she is stable for hospital discharge.  I think it is possible she may have microvascular dysfunction and I will try her on low-dose Cardizem CD and discontinue metoprolol succinate.  Otherwise she should continue on her current medical program.  Sherren Mocha 01/02/2019 5:21 PM

## 2019-01-02 NOTE — Discharge Instructions (Signed)
YOUR CARDIOLOGY TEAM HAS ARRANGED FOR AN E-VISIT FOR YOUR APPOINTMENT - PLEASE REVIEW IMPORTANT INFORMATION BELOW SEVERAL DAYS PRIOR TO YOUR APPOINTMENT  Due to the recent COVID-19 pandemic, we are transitioning in-person office visits to tele-medicine visits in an effort to decrease unnecessary exposure to our patients, their families, and staff. Medicare and most insurances are covering these visits without a copay needed. We also encourage you to sign up for MyChart if you have not already done so. You will need a smartphone if possible. For patients that do not have this, we can still complete the visit using a regular telephone but do prefer a smartphone to enable video when possible. You may have a family member that lives with you that can help. If possible, we also ask that you have a blood pressure cuff and scale at home to measure your blood pressure, heart rate and weight prior to your scheduled appointment. Patients with clinical needs that need an in-person evaluation and testing will still be able to come to the office if absolutely necessary. If you have any questions, feel free to call our office.     YOUR PROVIDER WILL BE USING THE FOLLOWING PLATFORM TO COMPLETE YOUR VISIT:   IF USING WEBEX - How to Download the WebEx App to Your SmartPhone  - If Apple device, go to [employer] and type in WebEx in the search bar. [employer], the blue/green circle. If Android, go to [employer] and type in [employer] in the search bar. The app is free but as with any other app download, your phone may require you to verify saved payment information or Apple/Android password.  - You do NOT have to create an account. - On the day of the visit, our staff will walk you through joining the meeting with the meeting number/password.   IF USING MYCHART - How to Download the MyChart App to Your SmartPhone   - If Apple, go to [employer] and type in MyChart in the search bar and download the  app. If Android, ask patient to go to [employer] and type in [facility] in the search bar and download the app. The app is free but as with any other app downloads, your phone may require you to verify saved payment information or Apple/Android password.  - You will need to then log into the app with your MyChart username and password, and select [facility] as your healthcare provider to link the account. When it is time for your visit, go to the MyChart app, find appointments, and click Begin Video Visit. Be sure to Select Allow for your device to access the Microphone and Camera for your visit. You will then be connected, and your provider will be with you shortly.  **If you have any issues connecting or need assistance, please contact MyChart service desk [phone])**  **If using a computer, in order to ensure the best quality for your visit, you will need to use either of the following Internet Browsers: [employer], or Google Chrome**   IF USING DOXIMITY or [email or web address].ME - The staff will give you instructions on receiving your link to join the meeting the day of your visit.      2-3 DAYS BEFORE YOUR APPOINTMENT  You will receive a telephone call from one of our [facility] team members - your caller ID may say "Unknown caller." If this is a video visit, we will walk you through how to set up your  device to be able to complete the visit. We will remind you check your blood pressure, heart rate and weight prior to your scheduled appointment. If you have an Apple Watch or Kardia, please upload any pertinent ECG strips the day before or morning of your appointment to [facility]. Our staff will also make sure you have reviewed the consent and agree to move forward with your scheduled tele-health visit.     THE DAY OF YOUR APPOINTMENT  Approximately 15 minutes prior to your scheduled appointment, you will receive a telephone call from one of [facility] team - your caller ID may say  "Unknown caller."  Our staff will confirm medications, vital signs for the day and any symptoms you may be experiencing. Please have this information available prior to the time of visit start. It may also be helpful for you to have a pad of paper and pen handy for any instructions given during your visit. They will also walk you through joining the smartphone meeting if this is a video visit.    CONSENT FOR TELE-HEALTH VISIT - PLEASE REVIEW  I hereby voluntarily request, consent and authorize [facility] and its employed or contracted physicians, physician assistants, nurse practitioners or other licensed health care professionals (the Practitioner), to provide me with telemedicine health care services (the Services") as deemed necessary by the treating Practitioner. I acknowledge and consent to receive the Services by the Practitioner via telemedicine. I understand that the telemedicine visit will involve communicating with the Practitioner through live audiovisual communication technology and the disclosure of certain medical information by electronic transmission. I acknowledge that I have been given the opportunity to request an in-person assessment or other available alternative prior to the telemedicine visit and am voluntarily participating in the telemedicine visit.  I understand that I have the right to withhold or withdraw my consent to the use of telemedicine in the course of my care at any time, without affecting my right to future care or treatment, and that the Practitioner or I may terminate the telemedicine visit at any time. I understand that I have the right to inspect all information obtained and/or recorded in the course of the telemedicine visit and may receive copies of available information for a reasonable fee.  I understand that some of the potential risks of receiving the Services via telemedicine include:   Delay or interruption in medical evaluation due to technological  equipment failure or disruption;  Information transmitted may not be sufficient (e.g. poor resolution of images) to allow for appropriate medical decision making by the Practitioner; and/or   In rare instances, security protocols could fail, causing a breach of personal health information.  Furthermore, I acknowledge that it is my responsibility to provide information about my medical history, conditions and care that is complete and accurate to the best of my ability. I acknowledge that Practitioner's advice, recommendations, and/or decision may be based on factors not within their control, such as incomplete or inaccurate data provided by me or distortions of diagnostic images or specimens that may result from electronic transmissions. I understand that the practice of medicine is not an exact science and that Practitioner makes no warranties or guarantees regarding treatment outcomes. I acknowledge that I will receive a copy of this consent concurrently upon execution via email to the email address I last provided but may also request a printed copy by calling the office of [facility].    I understand that my insurance will be billed for this visit.  I have read or had this consent read to me.  I understand the contents of this consent, which adequately explains the benefits and risks of the Services being provided via telemedicine.   I have been provided ample opportunity to ask questions regarding this consent and the Services and have had my questions answered to my satisfaction.  I give my informed consent for the services to be provided through the use of telemedicine in my medical care  By participating in this telemedicine visit I agree to the above.

## 2019-01-02 NOTE — Progress Notes (Signed)
Progress Note  Patient Name: Stacey Lawrence Date of Encounter: 01/02/2019  Primary Cardiologist: Kate Sable, MD   Subjective   No chest pain or shortness of breath. Complaining of a 'migraine headache.'  Inpatient Medications    Scheduled Meds: . aspirin EC  81 mg Oral Daily  . atorvastatin  40 mg Oral Daily  . DULoxetine  90 mg Oral Daily  . gabapentin  200 mg Oral Daily  . heparin  5,000 Units Subcutaneous Q8H  . nitroGLYCERIN  0.8 mg Sublingual Once   Continuous Infusions:  PRN Meds: acetaminophen, nitroGLYCERIN, traZODone   Vital Signs    Vitals:   01/01/19 1655 01/01/19 1700 01/01/19 1941 01/02/19 0558  BP: 130/72 127/73  120/68  Pulse: 70 69  63  Resp: 10 16  16   Temp: 98.3 F (36.8 C)  97.6 F (36.4 C) 98.1 F (36.7 C)  TempSrc: Oral  Oral Oral  SpO2: 98% 98%  96%  Weight:      Height:        Intake/Output Summary (Last 24 hours) at 01/02/2019 0911 Last data filed at 01/02/2019 0700 Gross per 24 hour  Intake 360 ml  Output 950 ml  Net -590 ml   Last 3 Weights 01/01/2019 07/02/2018 06/26/2018  Weight (lbs) 158 lb 155 lb 155 lb 8 oz  Weight (kg) 71.668 kg 70.308 kg 70.534 kg  Some encounter information is confidential and restricted. Go to Review Flowsheets activity to see all data.      Telemetry    Normal sinus rhythm, no arrhythmia - Personally Reviewed  ECG    01/01/19 NSR, no ST/ Twave abnormalities - Personally Reviewed  Physical Exam  Alert, oriented woman, cold pack on head GEN: No acute distress.   Neck: No JVD Cardiac: RRR, no murmurs, rubs, or gallops.  Respiratory: Clear to auscultation bilaterally. GI: Soft, nontender, non-distended  MS: No edema; No deformity. Neuro:  Nonfocal  Psych: Normal affect   Labs    Chemistry Recent Labs  Lab 01/01/19 0950 01/01/19 2037 01/02/19 0227  NA 139  --  136  K 3.9  --  6.0*  CL 103  --  104  CO2 24  --  23  GLUCOSE 108*  --  100*  BUN 10  --  17  CREATININE 0.85  1.11* 0.90  CALCIUM 9.3  --  8.8*  GFRNONAA >60 52* >60  GFRAA >60 60* >60  ANIONGAP 12  --  9     Hematology Recent Labs  Lab 01/01/19 0950 01/01/19 2037 01/02/19 0227  WBC 5.6 5.3 7.5  RBC 5.29* 5.24* 5.39*  HGB 15.1* 15.4* 15.4*  HCT 47.8* 46.3* 47.4*  MCV 90.4 88.4 87.9  MCH 28.5 29.4 28.6  MCHC 31.6 33.3 32.5  RDW 13.3 13.2 13.2  PLT 209 191 227    Cardiac Enzymes Recent Labs  Lab 01/01/19 0950 01/01/19 1502 01/01/19 2037 01/02/19 0227  TROPONINI <0.03 <0.03 <0.03 <0.03   No results for input(s): TROPIPOC in the last 168 hours.   BNPNo results for input(s): BNP, PROBNP in the last 168 hours.   DDimer No results for input(s): DDIMER in the last 168 hours.   Radiology    Dg Chest 2 View  Result Date: 01/01/2019 CLINICAL DATA:  Chest pain. EXAM: CHEST - 2 VIEW COMPARISON:  Radiographs of December 09, 2016. FINDINGS: The heart size and mediastinal contours are within normal limits. Both lungs are clear. No pneumothorax or pleural effusion is noted. The  visualized skeletal structures are unremarkable. IMPRESSION: No active cardiopulmonary disease. Electronically Signed   By: Marijo Conception M.D.   On: 01/01/2019 10:17    Cardiac Studies   Left heart catheterization 01/16/2013 Findings:  Ao Pressure: 129/78 (102) LV Pressure: 126/2/8 There was no signficant gradient across the aortic valve on pullback.  Left main: Anomalous. Arising in the right cusp anterior to RCA origin. Long. No stenosis. Gives off LAD, Ramus and LCX.   LAD: Gives off 2 diagonals. There is an intramyocardial segment (bridge) in the mid LAD with 30% plaque. Otherwise normal.  RAMUS: Normal  LCX: Gives off small OM-1, OM-2 and OM-3. OM-2 is a very small caliber vessel which extends out to the distal anterolateral wall and apex. In the middle to distal OM-2 the vessel is subtotally occluded with thrombus  RCA: Large dominant vessel gives off large PDA and PL.  LV-gram done in the  RAO projection: Ejection fraction = 55% focal area of hypokinesis in mid to distal anterolateral wall  Assessment: 1. CAD with occlusion of a small OM-2 2. 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 EF 55% with focal area of hypokinesis in mid to distal anterolateral wall  Plan/Discussion:  The OM-2 is too small for PCI. Will treat medically with aggressive RF modification. Start Plavix.  _____________ Studies this admission, Coronary CTA- pending    Patient Profile     Stacey Lawrence is a 68 y.o. female with h/o CAD with SCAD in 2014 of a small second obtuse marginal branch that was treated medically, varicose veins, Raynauds disease, migraine headaches, fibromyalgia, anxiety, depression, IBS and GERD admitted for CP.   Assessment & Plan    1. 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  She was also noted at that time to have a 30% mid LAD lesion but this was likely related to intramyocardial bridging.  Now admitted for symptoms c/w unstable angina. Troponins are negative x 3.  EKG shows normal sinus rhythm with no acute ST/T wave abnormalities. CXR unremarkable. Recent echocardiogram ordered by her PCP, done just last week, which showed normal left ventricular systolic function with an EF of 55 to 60%, normal wall motion and no significant valvular abnormalities.  Plan is for coronary CTA w/ morphology today to assess for any high-grade obstructive disease in the proximal epicardial vessels. Dr. Oval Linsey to read CTA.  If nonobstructive CAD is noted on coronary CTA, would consider addition of a low dose CCB for possible coronary vasospasm, given vasospastic disorders (Raynaud's and migraine headaches).   2. Hyperkalemia: K reported at 6.0 on AM labs. ? hemolyzed sample. Will obtain  repeat STAT K. Renal function ok. Can give a small dose of IV Lasix, 20 mg if need. I spoke with RN. No arrhthymias on tele. She will assist w/ getting repeat stat K lab drawn.   3. HLD: Lipid panel this morning shows elevated LDL at 82 mg/dL. Occluded second obtuse marginal branch noted on cath in 2014. Awaiting coronary CTA. If significant CAD noted, would recommend further increase of atorvastatin to 80 mg for more aggressive LDL reduction to goal of < 70 md/L.   For questions or updates, please contact [facility] Please consult [email or web address] for contact info under     Signed, Lyda Jester, PA-C  01/02/2019, 9:11 AM  Patient seen, examined. Available data reviewed. Agree with findings, assessment, and plan as outlined by Lyda Jester, PA-C.  The physical exam findings documented above reflect the findings of my personal exam of this patient today.  She is not having recurrent chest pain.  She is having a severe migraine headache which she has had in the past.  She states that this started after her CT scan.  We will give her a dose of IV Phenergan which is worked well for her previously.  Will await her coronary CTA results.  If she has no obstructive disease I would anticipate discharging her home later today.  Serial cardiac markers have ruled out myocardial infarction.  I agree that the addition of a calcium channel blocker may be appropriate as an empiric trial to treat presumed coronary vasospasm and/or coronary microvascular dysfunction.  Sherren Mocha, M.D. 01/02/2019 12:24 PM

## 2019-01-02 NOTE — Care Management Obs Status (Signed)
[facility] NOTIFICATION   Patient Details  Name: Stacey Lawrence MRN: [id number] Date of Birth: [birth year]   Medicare Observation Status Notification Given:  Yes    Carles Collet, RN 01/02/2019, 2:45 PM

## 2019-01-02 NOTE — Discharge Summary (Signed)
Discharge Summary    Patient ID: Stacey Lawrence MRN: [id number]; DOB: 13-Sep-1951  Admit date: 01/01/2019 Discharge date: 01/02/2019  Primary Care Provider: Celene Squibb, MD  Primary Cardiologist: Kate Sable, MD  Primary Electrophysiologist:  None    Patient Profile:   Stacey Lawrence is a 68 y.o. female with h/o CAD with SCAD in 2014 of a small second obtuse marginal branch that was treated medically, varicose veins, Raynauds disease, migraine headaches, fibromyalgia, anxiety, depression, IBS and GERD who presented to the [facility] emergency department on 01/01/19  with chief complaint of chest pain.  Discharge Diagnoses    Principal Problem:   Microvascular angina (HCC) Active Problems:   CAD (coronary artery disease)   Fibromyalgia   Allergies Allergies  Allergen Reactions   Dilaudid [Hydromorphone Hcl] Other (See Comments)    Resp. arrest   Codeine Nausea And Vomiting and Rash   Morphine And Related Nausea And Vomiting    Diagnostic Studies/Procedures    Past Cardiac Studies - Left heart catheterization 01/16/2013 Findings:  Ao Pressure: 129/78 (102) LV Pressure: 126/2/8 There was no signficant gradient across the aortic valve on pullback.  Left main: Anomalous. Arising in the right cusp anterior to RCA origin. Long. No stenosis. Gives off LAD, Ramus and LCX.   LAD: Gives off 2 diagonals. There is an intramyocardial segment (bridge) in the mid LAD with 30% plaque. Otherwise normal.  RAMUS: Normal  LCX: Gives off small OM-1, OM-2 and OM-3. OM-2 is a very small caliber vessel which extends out to the distal anterolateral wall and apex. In the middle to distal OM-2 the vessel is subtotally occluded with thrombus  RCA: Large dominant vessel gives off large PDA and PL.  LV-gram done in the RAO projection: Ejection fraction = 55% focal area of hypokinesis in mid to distal anterolateral wall  Assessment: 1. CAD with occlusion of a small OM-2 2.  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 EF 55% with focal area of hypokinesis in mid to distal anterolateral wall  Plan/Discussion:  The OM-2 is too small for PCI. Will treat medically with aggressive RF modification. Start Plavix.  _____________ Studies this admission, Coronary CTA- 01/02/19 IMPRESSION: 1. Coronary calcium score of 0. This was 0 percentile for age and sex matched control.  2. 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  3.  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  4.  No obstructive coronary artery disease.  5.  Mild atherosclerosis of the descending aorta.    History of Present Illness     Stacey Lawrence is followed by Dr. Bronson Ing. She has a history ofnon-ST segment elevation myocardial infarction in April 2014, secondary to spontaneous coronary artery dissection of a small second obtuse marginal branch, which was medically managed.  She was also found to have a 30% mid LAD lesion at that time.  She had a nuclear stress test performed on 06/04/2013 and was low risk. Demonstrated old small inferolateral scar with no evidence of ischemia and normal left ventricular systolic function.  She also has a long history of symptomatic varicose veins with pain and swelling with no significant superficial reflux. She has mild deep reflux. She has seen vascular surgery and was told the primary therapy is compression stockings. Dr. Oneida Alar discussed the possibility of sclerotherapy but she was not interested.  Additional medical problems include polycythemia vera, Raynauds disease, fibromyalgia, depression, anxiety, IBS, GERD and migraine headaches.  She had a recent outpatient  echocardiogram that was ordered by her PCP and done through [facility] on 12/24/2018 for dyspnea.  Echocardiogram showed normal left ventricular EF, 55- 60%, and normal wall motion.  No significant valvular  abnormalities and no pericardial effusion.  Echo report is outlined below and was taken from record in [place].  Patient now presents to the [facility] emergency department with chief complaint of chest pain. The pain woke her up from her sleep.  Described as left-sided chest pain, rated 9 out of 10.  Patient has had chest pain off and on for the last 3 months however chest pain last night was more severe and more prolonged and unrelieved with sublingual nitroglycerin.  Patient called the office earlier this morning for notification and was advised to go to the ED for further evaluation.  Also, as outlined above, patient recently was ordered by her PCP to get an echocardiogram for exertional dyspnea.  However, study was unremarkable.  In the ED, EKG shows normal sinus rhythm.  No ischemic abnormalities.  67 bpm.  Point-of-care troponin in the ED is negative x1.  Basic metabolic panel is unremarkable.  CBC shows hemoglobin of 15.1 (history of polycythemia vera). Chest x-ray shows no active cardiopulmonary disease.  Systolic blood pressures in the ED ranging from 120-146. Diastolic pressures 79-02.  Heart rate in the upper 50s low 60s.  She is afebrile.  Oxygen saturations in the upper 90s.  Home cardiac meds include aspirin, atorvastatin and PRN sublingual nitro.  Hospital Course     Pt was admitted to telemetry by cardiology for overnight observation and further w/u. Cardiac enzymes were cycled x 3 and negative, ruling out myocardial infarction. On 4/15, she underwent a coronary CTA w/ morphology. This showed coronary calcium score of 0. This was 0 percentile for age and sex matched control. 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 No obstructive coronary artery disease. Given her history of Raynauds and migraine headaches, diltiazem was added to regimen for possible coronary vasospasm and possible microvascular angina. Metoprolol was  discontinued. Pt last seen and examined by Dr. Burt Knack, who determined she was stable for discharge home. F/u will be arranged w/ Dr. Bronson Ing.   Consultants: none   Discharge Vitals Blood pressure 111/69, pulse 75, temperature 98.1 F (36.7 C), temperature source Oral, resp. rate 15, height 5' 7.5" (1.715 m), weight 71.7 kg, SpO2 94 %.  Filed Weights   01/01/19 0923  Weight: 71.7 kg    Labs & Radiologic Studies    CBC Recent Labs    01/01/19 0950 01/01/19 2037 01/02/19 0227  WBC 5.6 5.3 7.5  NEUTROABS 2.7  --   --   HGB 15.1* 15.4* 15.4*  HCT 47.8* 46.3* 47.4*  MCV 90.4 88.4 87.9  PLT 209 191 409   Basic Metabolic Panel Recent Labs    01/01/19 0950 01/01/19 2037 01/02/19 0227 01/02/19 0937  NA 139  --  136  --   K 3.9  --  6.0* 4.1  CL 103  --  104  --   CO2 24  --  23  --   GLUCOSE 108*  --  100*  --   BUN 10  --  17  --   CREATININE 0.85 1.11* 0.90  --   CALCIUM 9.3  --  8.8*  --    Liver Function Tests No results for input(s): AST, ALT, ALKPHOS, BILITOT, PROT, ALBUMIN in the last 72  hours. No results for input(s): LIPASE, AMYLASE in the last 72 hours. Cardiac Enzymes Recent Labs    01/01/19 1502 01/01/19 2037 01/02/19 0227  TROPONINI <0.03 <0.03 <0.03   BNP Invalid input(s): POCBNP D-Dimer No results for input(s): DDIMER in the last 72 hours. Hemoglobin A1C No results for input(s): HGBA1C in the last 72 hours. Fasting Lipid Panel Recent Labs    01/02/19 0227  CHOL 176  HDL 80  LDLCALC 85  TRIG 53  CHOLHDL 2.2   Thyroid Function Tests No results for input(s): TSH, T4TOTAL, T3FREE, THYROIDAB in the last 72 hours.  Invalid input(s): FREET3 _____________  Dg Chest 2 View  Result Date: 01/01/2019 CLINICAL DATA:  Chest pain. EXAM: CHEST - 2 VIEW COMPARISON:  Radiographs of December 09, 2016. FINDINGS: The heart size and mediastinal contours are within normal limits. Both lungs are clear. No pneumothorax or pleural effusion is noted. The  visualized skeletal structures are unremarkable. IMPRESSION: No active cardiopulmonary disease. Electronically Signed   By: Marijo Conception M.D.   On: 01/01/2019 10:17   Ct Coronary Morph W/cta Cor W/score W/ca W/cm &/or Wo/cm  Addendum Date: 01/02/2019   ADDENDUM REPORT: 01/02/2019 17:18 CLINICAL DATA:  13F with SCAD and chest pain. EXAM: Cardiac/Coronary  CT TECHNIQUE: The patient was scanned on a [employer]. FINDINGS: A 120 kV prospective scan was triggered in the descending thoracic aorta at 111 HU's. Axial non-contrast 3 mm slices were carried out through the heart. The data set was analyzed on a dedicated work station and scored using the [place]. Gantry rotation speed was 250 msecs and collimation was .6 mm. No beta blockade and 0.8 mg of sl NTG was given. The 3D data set was reconstructed in 5% intervals of the 67-82 % of the R-R cycle. Diastolic phases were analyzed on a dedicated work station using MPR, MIP and VRT modes. The patient received 80 cc of contrast. Aorta: Normal size. Mild calcification of the descending aorta. No dissection. Aortic Valve:  Trileaflet.  No calcifications. Coronary Arteries: Anomalous left main arising from the right coronary cusp. Right dominance. RCA is a large dominant artery that gives rise to PDA and PLV branches. There is no plaque. Left main is long, anomalous and arises from the right coronary cusp. It courses posteriorly between the aortic valve and left atrium. The LM is a large artery that gives rise to LAD and LCX arteries. There is no plaque. LAD is a large vessel that has no plaque. There is a 1.8 cm region of superficial myocardial bridging in the mid LAD. There are diagonal branches without evidence of disease. LCX is a non-dominant artery that gives rise to one large OM1 branch and a small OM2. There is no plaque. Other findings: Normal pulmonary vein drainage into the left atrium. Normal let atrial appendage without a thrombus. Normal  size of the pulmonary artery. IMPRESSION: 1. Coronary calcium score of 0. This was 0 percentile for age and sex matched control. 2. 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 3.  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 4.  No obstructive coronary artery disease. 5.  Mild atherosclerosis of the descending aorta. Skeet Latch, MD Electronically Signed   By: Skeet Latch   On: 01/02/2019 17:18   Result Date: 01/02/2019 EXAM: OVER-READ INTERPRETATION  CT CHEST The following report is an over-read performed by radiologist Dr. Abigail Miyamoto of [facility], [place] on 01/02/2019. This over-read does not include interpretation of cardiac  or coronary anatomy or pathology. The coronary CTA interpretation by the cardiologist is attached. COMPARISON:  Chest radiograph of 1 day prior.  No prior CT. FINDINGS: Vascular: Aortic atherosclerosis. No dissection or aneurysm. No central pulmonary embolism, on this non-dedicated study. Mediastinum/Nodes: No imaged thoracic adenopathy. Tiny hiatal hernia. Lungs/Pleura: No pleural fluid. Bibasilar scarring or subsegmental atelectasis. Upper Abdomen: Normal imaged portions of the spleen. Incompletely imaged possible hyperenhancing focus in the high right hepatic lobe at 11 mm on image 45/11. Musculoskeletal: No acute osseous abnormality. IMPRESSION: 1.  No acute findings in the imaged extracardiac chest. 2. Possible hyperenhancing focus in the high right liver lobe. Presuming no history of primary malignancy or liver disease, most likely a perfusion anomaly. This could be further evaluated with nonemergent right upper quadrant ultrasound. If any such history of primary malignancy or liver disease, consider outpatient nonemergent pre and post contrast abdominal MRI. 3.  Aortic Atherosclerosis (ICD10-I70.0). 4.  Tiny hiatal hernia. Electronically Signed: By: Abigail Miyamoto M.D. On: 01/02/2019 09:49   Disposition   Pt is being discharged home today in good  condition.  Follow-up Plans & Appointments    Follow-up Information    Herminio Commons, MD Follow up.   Specialty:  Cardiology Why:  our office will call you with an appointment for a virtual (telehealth) visit  Contact information: [address] [place] [address] [phone]            Discharge Medications   Allergies as of 01/02/2019      Reactions   Dilaudid [hydromorphone Hcl] Other (See Comments)   Resp. arrest   Codeine Nausea And Vomiting, Rash   Morphine And Related Nausea And Vomiting      Medication List    STOP taking these medications   atorvastatin 40 MG tablet Commonly known as:  LIPITOR     TAKE these medications   acetaminophen 325 MG tablet Commonly known as:  TYLENOL Take 2 tablets (650 mg total) by mouth every 4 (four) hours as needed for headache or mild pain.   aspirin 81 MG chewable tablet Chew 81 mg by mouth at bedtime.   Biotin 5000 5 MG Caps Generic drug:  Biotin Take 1 capsule by mouth daily.   CALCITRATE/VITAMIN D PO Take 1 tablet by mouth daily.   carisoprodol 350 MG tablet Commonly known as:  SOMA Take 350 mg by mouth daily as needed for muscle spasms.   celecoxib 200 MG capsule Commonly known as:  CELEBREX Take 200 mg by mouth daily.   diltiazem 120 MG 24 hr capsule Commonly known as:  CARDIZEM CD Take 1 capsule (120 mg total) by mouth daily.   DULoxetine 30 MG capsule Commonly known as:  CYMBALTA Take 90 mg daily (60 mg + 30 mg daily)   DULoxetine 60 MG capsule Commonly known as:  CYMBALTA Take 1 capsule (60 mg total) by mouth daily.   gabapentin 100 MG capsule Commonly known as:  Neurontin Take 2 capsules (200 mg total) by mouth 3 (three) times daily. What changed:  when to take this   nitroGLYCERIN 0.4 MG SL tablet Commonly known as:  NITROSTAT PLACE 1 TABLET UNDER THE TONGUE EVERY 5 MINUTES AS NEEDED FOR CHEST PAIN What changed:  See the new instructions.   promethazine 25 MG tablet Commonly  known as:  PHENERGAN Take 25 mg by mouth every 6 (six) hours as needed for nausea or vomiting.   traZODone 50 MG tablet Commonly known as:  DESYREL Take  1 tablet (50 mg total) by mouth at bedtime as needed for sleep.   VITAMIN C PO Take 2 tablets by mouth daily.        Acute coronary syndrome (MI, NSTEMI, STEMI, etc) this admission?: No.    Outstanding Labs/Studies   none  Duration of Discharge Encounter   Greater than 30 minutes including physician time.  Signed, Lyda Jester, PA-C 01/02/2019, 5:54 PM

## 2019-01-03 MED FILL — DILTIAZEM 24HR CD 120 MG CA: 120 | 30 days supply | Qty: 30 | Fill #0

## 2019-01-08 ENCOUNTER — Ambulatory Visit: Payer: Self-pay | Admitting: Cardiovascular Disease

## 2019-01-10 MED FILL — VIT D2 1.25 MG (50,000 UNIT: 1.25 MG | 84 days supply | Qty: 12 | Fill #0

## 2019-01-14 MED FILL — CARISOPRODOL 350 MG TABS: 350 | 20 days supply | Qty: 60 | Fill #0

## 2019-01-17 ENCOUNTER — Telehealth (INDEPENDENT_AMBULATORY_CARE_PROVIDER_SITE_OTHER): Payer: Medicare Other | Admitting: Cardiovascular Disease

## 2019-01-17 ENCOUNTER — Encounter: Payer: Self-pay | Admitting: Cardiovascular Disease

## 2019-01-17 VITALS — BP 100/68 | HR 80 | Temp 98.2°F | Ht 67.5 in | Wt 158.0 lb

## 2019-01-17 DIAGNOSIS — E785 Hyperlipidemia, unspecified: Secondary | ICD-10-CM

## 2019-01-17 DIAGNOSIS — I25118 Atherosclerotic heart disease of native coronary artery with other forms of angina pectoris: Secondary | ICD-10-CM

## 2019-01-17 DIAGNOSIS — Z9289 Personal history of other medical treatment: Secondary | ICD-10-CM

## 2019-01-17 DIAGNOSIS — I83893 Varicose veins of bilateral lower extremities with other complications: Secondary | ICD-10-CM

## 2019-01-17 NOTE — Progress Notes (Signed)
Virtual Visit via Video Note   This visit type was conducted due to national recommendations for restrictions regarding the COVID-19 Pandemic (e.g. social distancing) in an effort to limit this patient's exposure and mitigate transmission in our community.  Due to her co-morbid illnesses, this patient is at least at moderate risk for complications without adequate follow up.  This format is felt to be most appropriate for this patient at this time.  All issues noted in this document were discussed and addressed.  A limited physical exam was performed with this format.  Please refer to the patient's chart for her consent to telehealth for [facility].   Evaluation Performed:  Follow-up visit  Date:  01/17/2019   ID:  Stacey Lawrence, Stacey Lawrence 08-13-1951, MRN [id number]  Patient Location: Home Provider Location: Home  PCP:  Celene Squibb, MD  Cardiologist:  Kate Sable, MD  Electrophysiologist:  None   Chief Complaint:  Chest pain  History of Present Illness:    ALEEYA Lawrence is a 68 y.o. female with CAD and chest pain.  She was recently hospitalized at [facility] for unstable angina. Coronary CT angiography showed no high grade obstructive disease. 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 Coronary calcium score was 0. 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  She was started on Cardizem CD 120 mg daily for possible microvascular dysfunction. Toprol-XL was discontinued during hospitalization. She had adverse reactions to nitro.  She is doing well overall. She has been gardening. She has some mild occasional chest pains but seldom and mild in intensity. She has ankle and feet swelling if she's been up on them for a while (chronic).   The patient does not have symptoms concerning for COVID-19 infection (fever, chills, cough, or new shortness of breath).    Past Medical History:  Diagnosis Date  . Anemia    hx  . Anxiety   . CAD (coronary  artery disease)    a. 12/2012 NSTEMI/Cath: LM anomalous, arising in R cusp ant to RCA, otw nl, LAD 18m with bridge, RI nl, LCX nl, OM2 small, subtl occl w/ thrombus distally - felt to be spont dissection, too small for PCI->Med Rx, RCA large, dom, nl, PDA/PD nl, EF 55% w/ focal HK in mid-dist antlat wall;  b. 05/2013 Lexi CL: EF 77%, old small inferolat scar, no ischemia.  . Common migraine with intractable migraine 06/16/2017  . Depression   . Fibromyalgia   . GERD (gastroesophageal reflux disease)    hasn't started taking any meds  . History of blood transfusion    41yrs ago  . History of kidney stones   . Hx of colonic polyps   . IBS (irritable bowel syndrome)    constipation  . Joint pain   . Joint swelling   . Migraine    "q other week" (02/04/2016)  . Myocardial infarction ([place])   . Numbness    right leg  . Polycythemia   . PONV (postoperative nausea and vomiting)   . Raynaud's disease   . Scoliosis   . Shortness of breath dyspnea    with exertion   Past Surgical History:  Procedure Laterality Date  . ABDOMINAL HYSTERECTOMY     partial  . APPENDECTOMY     with explor. lap.  Marland Kitchen CARDIAC CATHETERIZATION    . CHOLECYSTECTOMY  10/28/2002   lap.  . COLONOSCOPY    . DEBRIDEMENT TENNIS ELBOW    .  DIAGNOSTIC LAPAROSCOPY    . DILATION AND CURETTAGE OF UTERUS    . ELBOW SURGERY     golfer's elbow-bilateral  . ESOPHAGOGASTRODUODENOSCOPY  09/10/2012   Procedure: ESOPHAGOGASTRODUODENOSCOPY (EGD);  Surgeon: Rogene Houston, MD;  [facility];  Service: Endoscopy;  Laterality: N/A;  730  . EXCISION/RELEASE BURSA HIP Left 02/04/2016   Procedure: LEFT HIP TROCHANTERIC BURSECTOMY;  Surgeon: Mcarthur Rossetti, MD;  Location: [place];  Service: Orthopedics;  Laterality: Left;  . I&D EXTREMITY  02/25/2012   Procedure: IRRIGATION AND DEBRIDEMENT EXTREMITY;  Surgeon: Roseanne Kaufman, MD;  Location: [place];  Service: Orthopedics;  Laterality: Right;  Irrigation and Debridement and  Repair Right Thumb Nerve Laceration and Assoiated Structures   . KNEE DEBRIDEMENT     right  . LAPAROTOMY    . LEFT HEART CATHETERIZATION WITH CORONARY ANGIOGRAM N/A 01/16/2013   Procedure: LEFT HEART CATHETERIZATION WITH CORONARY ANGIOGRAM;  Surgeon: Jolaine Artist, MD;  Location: [facility] CATH LAB;  Service: Cardiovascular;  Laterality: N/A;  . STAPEDECTOMY     right  . TONSILLECTOMY AND ADENOIDECTOMY    . TRIGGER FINGER RELEASE  08/19/2011   Procedure: RELEASE TRIGGER FINGER/A-1 PULLEY;  Surgeon: Willa Frater III;  Location: [place];  Service: Orthopedics;  Laterality: Right;  right middle finger a-1 pulley release with tenosynovectomy  . TROCHANTERIC BURSA EXCISION Right 02/04/2016  . TUBAL LIGATION    . TYMPANOPLASTY       Current Meds  Medication Sig  . acetaminophen (TYLENOL) 325 MG tablet Take 2 tablets (650 mg total) by mouth every 4 (four) hours as needed for headache or mild pain.  . Ascorbic Acid (VITAMIN C PO) Take 2 tablets by mouth daily.  Marland Kitchen aspirin 81 MG chewable tablet Chew 81 mg by mouth at bedtime.   . Biotin (BIOTIN 5000) 5 MG CAPS Take 1 capsule by mouth daily.  . Calcium Citrate-Vitamin D (CALCITRATE/VITAMIN D PO) Take 1 tablet by mouth daily.  . carisoprodol (SOMA) 350 MG tablet Take 350 mg by mouth daily as needed for muscle spasms.   . celecoxib (CELEBREX) 200 MG capsule Take 200 mg by mouth daily.   Marland Kitchen diltiazem (CARDIZEM CD) 120 MG 24 hr capsule Take 1 capsule (120 mg total) by mouth daily.  . DULoxetine (CYMBALTA) 30 MG capsule Take 90 mg daily (60 mg + 30 mg daily)  . DULoxetine (CYMBALTA) 60 MG capsule Take 1 capsule (60 mg total) by mouth daily.  Marland Kitchen gabapentin (NEURONTIN) 100 MG capsule Take 2 capsules (200 mg total) by mouth 3 (three) times daily. (Patient taking differently: Take 200 mg by mouth daily. )  . nitroGLYCERIN (NITROSTAT) 0.4 MG SL tablet PLACE 1 TABLET UNDER THE TONGUE EVERY 5 MINUTES AS NEEDED FOR CHEST PAIN (Patient taking  differently: Place 0.4 mg under the tongue every 5 (five) minutes as needed. )  . promethazine (PHENERGAN) 25 MG tablet Take 25 mg by mouth every 6 (six) hours as needed for nausea or vomiting.  . traZODone (DESYREL) 50 MG tablet Take 1 tablet (50 mg total) by mouth at bedtime as needed for sleep.     Allergies:   Dilaudid [hydromorphone hcl]; Codeine; and Morphine and related   Social History   Tobacco Use  . Smoking status: Never Smoker  . Smokeless tobacco: Never Used  Substance Use Topics  . Alcohol use: No    Alcohol/week: 0.0 standard drinks  . Drug use: No     Family Hx: The patient's  family history includes ADD / ADHD in her son; COPD in her mother; Cancer in her father and mother; Depression in her brother, father, and mother; Heart disease in her father and mother; Heart failure in her mother; Varicose Veins in her father and mother.  ROS:   Please see the history of present illness.     All other systems reviewed and are negative.   Prior CV studies:   The following studies were reviewed today:  Coronary CTA (01/02/19):  IMPRESSION: 1. Coronary calcium score of 0. This was 0 percentile for age and sex matched control.  2. 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  3.  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  4.  No obstructive coronary artery disease.  5.  Mild atherosclerosis of the descending aorta.  Left heart catheterization 01/16/2013 Findings:  Ao Pressure: 129/78 (102) LV Pressure: 126/2/8 There was no signficant gradient across the aortic valve on pullback.  Left main: Anomalous. Arising in the right cusp anterior to RCA origin. Long. No stenosis. Gives off LAD, Ramus and LCX.   LAD: Gives off 2 diagonals. There is an intramyocardial segment (bridge) in the mid LAD with 30% plaque. Otherwise normal.  RAMUS: Normal  LCX: Gives off small OM-1, OM-2 and OM-3. OM-2 is a very small caliber vessel which extends out to  the distal anterolateral wall and apex. In the middle to distal OM-2 the vessel is subtotally occluded with thrombus  RCA: Large dominant vessel gives off large PDA and PL.  LV-gram done in the RAO projection: Ejection fraction = 55% focal area of hypokinesis in mid to distal anterolateral wall  Assessment: 1. CAD with occlusion of a small OM-2 2. 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 EF 55% with focal area of hypokinesis in mid to distal anterolateral wall  Plan/Discussion:  The OM-2 is too small for PCI. Will treat medically with aggressive RF modification. Start Plavix. ______________________________  2D echocardiogram 12/24/2018 at [facility] (outpatient echo) SUMMARY  Injection of agitated saline contrast performed to evaluate for possible shunt  The left ventricular size is normal.    Left ventricular systolic function is normal.  LV ejection fraction = 55-60%.    Left ventricular filling pattern is prolonged relaxation.  No segmental wall motion abnormalities seen in the left ventricle  The right ventricle is normal in size and function.  Injection of agitated saline showed no right-to-left shunt.  There is mild mitral regurgitation.  The aortic sinus is normal size.  The IVC is normal in size with an inspiratory collapse of greater then 50%,   suggesting normal right atrial pressure.  There is no pericardial effusion.  There is no comparison study available.  -  FINDINGS:    LEFT VENTRICLE  The left ventricular size is normal. There is normal left ventricular wall   thickness. Left ventricular systolic function is normal. LV ejection fraction   = 55-60%. Left ventricular filling pattern is prolonged relaxation. No   segmental wall motion abnormalities seen in the left ventricle.   Labs/Other Tests and Data Reviewed:    EKG:  No ECG reviewed.  Recent Labs: 01/02/2019: BUN  17; Creatinine, Ser 0.90; Hemoglobin 15.4; Platelets 227; Potassium 4.1; Sodium 136   Recent Lipid Panel Lab Results  Component Value Date/Time   CHOL 176 01/02/2019 02:27 AM   TRIG 53 01/02/2019 02:27 AM   HDL 80 01/02/2019 02:27 AM   CHOLHDL  2.2 01/02/2019 02:27 AM   LDLCALC 85 01/02/2019 02:27 AM    Wt Readings from Last 3 Encounters:  01/17/19 158 lb (71.7 kg)  01/01/19 158 lb (71.7 kg)  07/02/18 155 lb (70.3 kg)     Objective:    Vital Signs:  BP 100/68   Pulse 80   Temp 98.2 F (36.8 C)   Ht 5' 7.5" (1.715 m)   Wt 158 lb (71.7 kg)   BMI 24.38 kg/m    VITAL SIGNS:  reviewed GEN:  no acute distress EYES:  sclerae anicteric, EOMI - Extraocular Movements Intact RESPIRATORY:  normal respiratory effort, symmetric expansion MUSCULOSKELETAL:  no obvious deformities. NEURO:  alert and oriented x 3, no obvious focal deficit PSYCH:  normal affect  ASSESSMENT & PLAN:    1. CAD/chest pain: patient has a history of myocardial infarction in 2014 secondary to spontaneous coronary artery dissection of a small second obtuse marginal branch that was treated medically. She was also noted at that time to have a 30% mid LAD lesion but this was likely related to intramyocardial bridging.  Symptoms much improved on Cardizem CD 120 mg daily. Likely has some degree of microvascular dysfunction given her other vasospastic disease (Raynaud's).   2. HLD: Continue atorvastatin 80 mg.  3. Venous varicosities:Continues compression stocking use, 30-40 mmHg.    COVID-19 Education: The signs and symptoms of COVID-19 were discussed with the patient and how to seek care for testing (follow up with PCP or arrange E-visit).  The importance of social distancing was discussed today.  Time:   Today, I have spent 25 minutes with the patient with telehealth technology discussing the above problems.     Medication Adjustments/Labs and Tests Ordered: Current medicines are reviewed at length with  the patient today.  Concerns regarding medicines are outlined above.   Tests Ordered: No orders of the defined types were placed in this encounter.   Medication Changes: No orders of the defined types were placed in this encounter.   Disposition:  Follow up in 3 month(s)  Signed, Kate Sable, MD  01/17/2019 4:16 PM    [facility]

## 2019-01-17 NOTE — Patient Instructions (Signed)
Medication Instructions:  Continue all current medications.  Labwork: none  Testing/Procedures: none  Follow-Up: 3 months   Any Other Special Instructions Will Be Listed Below (If Applicable).  If you need a refill on your cardiac medications before your next appointment, please call your pharmacy.  

## 2019-01-23 ENCOUNTER — Ambulatory Visit (INDEPENDENT_AMBULATORY_CARE_PROVIDER_SITE_OTHER): Payer: Medicare Other | Admitting: Psychiatry

## 2019-01-23 ENCOUNTER — Other Ambulatory Visit: Payer: Self-pay

## 2019-01-23 DIAGNOSIS — F330 Major depressive disorder, recurrent, mild: Secondary | ICD-10-CM | POA: Diagnosis not present
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
## 2019-01-30 MED FILL — DILTIAZEM HCL ER COATED BEA: 120 | 30 days supply | Qty: 30 | Fill #1

## 2019-02-06 ENCOUNTER — Other Ambulatory Visit: Payer: Self-pay

## 2019-02-06 ENCOUNTER — Ambulatory Visit (INDEPENDENT_AMBULATORY_CARE_PROVIDER_SITE_OTHER): Payer: Medicare Other | Admitting: Psychiatry

## 2019-02-06 DIAGNOSIS — F330 Major depressive disorder, recurrent, mild: Secondary | ICD-10-CM | POA: Diagnosis not present
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
## 2019-02-14 ENCOUNTER — Encounter (INDEPENDENT_AMBULATORY_CARE_PROVIDER_SITE_OTHER): Payer: Self-pay | Admitting: *Deleted

## 2019-02-19 NOTE — Progress Notes (Signed)
PATIENT: Stacey Lawrence DOB: October 25, 1950  REASON FOR VISIT: follow up HISTORY FROM: patient  Virtual Visit via Telephone Note  I connected with Stacey Lawrence on 02/20/19 at  8:00 AM EDT by telephone and verified that I am speaking with the correct person using two identifiers.   I discussed the limitations, risks, security and privacy concerns of performing an evaluation and management service by telephone and the availability of in person appointments. I also discussed with the patient that there may be a patient responsible charge related to this service. The patient expressed understanding and agreed to proceed.   History of Present Illness:  02/20/19 Stacey Lawrence is a 69 y.o. female for follow up of essential tremor.  She continues gabapentin 200 mg daily.  She does feel that this helps with her tremor, however, she notes that tremor waxes and wanes.  Tremor is definitely worse after a long or stressful day.  Yesterday she worked in her garden and noted worsening afterwards.  She reports that her headaches have been well managed.  She usually has 1-2 migraines per month.  This is significantly reduced from her baseline.  She feels that she is doing well and without concerns today.   HISTORY (copied from [facility] not on 06/26/2018)  Stacey Lawrence is a 68 year old female with a history of essential tremor.  She returns today for follow-up.  She currently taking gabapentin 100 mg 3 times a day.  The prescriptions is written that she can take 200 mg 3 times a day.  She states that she only takes 200 mg and she knows she is in a half to do something.  That the tremor will be exacerbated.  She states that they continue tablets does help her tremor.  She does feel that over time it has gotten worse.  She also reports that she is been having dizziness.  She has followed with ENT and was treated for an ear infection however the dizziness has continued.  She reports that her ENT did  not feel that it was related to her ears.  The patient states that the dizziness only occurs with positional changes.  She states that she can be driving and look to the right or left and she will get a mild spinning sensation that resolved in several seconds.  She states that the same thing occurring that she bends over and stands up.  She denies any associated symptoms.  She reports that her headaches have been under relatively good control with gabapentin.  She states that the dizziness does not occur with her headaches.  She returns today for evaluation.   HISTORY 12/14/17: Stacey Lawrence a 68 year old right-handed white female with history of an essential tremor. The patient indicates that she is having difficulty performing tasks that require fine motor control. She likes to paint and hadto give up painting detailed projects. She also has had to give upsewingin part. The patient has tremors on both arms. She denies any vocal tremor or head or neck tremor. She also has had an increase in her migraine headaches that are now on average 2 times a week, oftentimes coming on at night and may respond to caffeinated products. The patient has been on Cymbalta, the dose was recently increased one week ago. The patient claims that her maternal grandmother also had a similar essential tremor. The patient comes the office today for an evaluation.    Observations/Objective:  Generalized: Well developed, in no acute distress  Mentation:  Alert oriented to time, place, history taking. Follows all commands speech and language fluent   Assessment and Plan:  68 y.o. year old female  has a past medical history of Anemia, Anxiety, CAD (coronary artery disease), Common migraine with intractable migraine (06/16/2017), Depression, Fibromyalgia, GERD (gastroesophageal reflux disease), History of blood transfusion, History of kidney stones, colonic polyps, IBS (irritable bowel syndrome), Joint pain, Joint swelling,  Migraine, Myocardial infarction ([place]), Numbness, Polycythemia, PONV (postoperative nausea and vomiting), Raynaud's disease, Scoliosis, and Shortness of breath dyspnea. here with    ICD-10-CM   1. Tremor R25.1   2. Migraine without aura and without status migrainosus, not intractable G43.009    Mrs. Werner Lawrence continues to do well with gabapentin 200 mg/day.  She feels that this helps to control tremor as well as her migraines.  We will continue current therapy.  She was advised to call with any worsening or new symptoms.  We will follow-up annually.  She verbalizes understanding and agreement with this plan.  No orders of the defined types were placed in this encounter.   No orders of the defined types were placed in this encounter.    Follow Up Instructions:  I discussed the assessment and treatment plan with the patient. The patient was provided an opportunity to ask questions and all were answered. The patient agreed with the plan and demonstrated an understanding of the instructions.   The patient was advised to call back or seek an in-person evaluation if the symptoms worsen or if the condition fails to improve as anticipated.  I provided 25 minutes of non-face-to-face time during this encounter.  Patient is located at her place of residence during teleconference.  Provider is located at her place of residence.  Maryelizabeth Kaufmann, CMA helped to facilitate visit.   Debbora Presto, NP

## 2019-02-20 ENCOUNTER — Ambulatory Visit: Payer: Medicare Other | Admitting: Adult Health

## 2019-02-20 ENCOUNTER — Other Ambulatory Visit: Payer: Self-pay

## 2019-02-20 ENCOUNTER — Ambulatory Visit (INDEPENDENT_AMBULATORY_CARE_PROVIDER_SITE_OTHER): Payer: Medicare Other | Admitting: Family Medicine

## 2019-02-20 ENCOUNTER — Encounter: Payer: Self-pay | Admitting: Family Medicine

## 2019-02-20 DIAGNOSIS — G43009 Migraine without aura, not intractable, without status migrainosus: Secondary | ICD-10-CM

## 2019-02-20 DIAGNOSIS — R251 Tremor, unspecified: Secondary | ICD-10-CM

## 2019-02-20 NOTE — Progress Notes (Signed)
I have read the note, and I agree with the clinical assessment and plan.  Blasa Raisch K Jarah Pember   

## 2019-02-22 ENCOUNTER — Other Ambulatory Visit: Payer: Self-pay

## 2019-02-22 DIAGNOSIS — Z20822 Contact with and (suspected) exposure to covid-19: Secondary | ICD-10-CM

## 2019-02-27 ENCOUNTER — Ambulatory Visit (HOSPITAL_COMMUNITY): Payer: Medicare Other | Admitting: Psychiatry

## 2019-03-01 LAB — NOVEL CORONAVIRUS, NAA: SARS-CoV-2, NAA: NOT DETECTED

## 2019-03-05 DIAGNOSIS — M722 Plantar fascial fibromatosis: Secondary | ICD-10-CM | POA: Diagnosis not present

## 2019-03-05 DIAGNOSIS — M79671 Pain in right foot: Secondary | ICD-10-CM | POA: Diagnosis not present

## 2019-03-06 ENCOUNTER — Other Ambulatory Visit (HOSPITAL_COMMUNITY): Payer: Self-pay | Admitting: Psychiatry

## 2019-03-06 ENCOUNTER — Other Ambulatory Visit: Payer: Self-pay | Admitting: Adult Health

## 2019-03-06 MED ORDER — DULOXETINE HCL 60 MG PO CPEP: 60.0000 mg | ORAL_CAPSULE | Freq: Every day | ORAL | 0 refills | Status: DC

## 2019-03-06 MED ORDER — TRAZODONE HCL 50 MG PO TABS: 50.0000 mg | ORAL_TABLET | Freq: Every evening | ORAL | 0 refills | Status: DC | PRN

## 2019-03-06 MED FILL — DILTIAZEM HCL ER COATED BEA: 120 | 30 days supply | Qty: 30 | Fill #2

## 2019-03-06 MED FILL — DULOXETINE HCL 60 MG CPEP: 60 | 90 days supply | Qty: 90 | Fill #0

## 2019-03-06 MED FILL — GABAPENTIN 100 MG CAPSULE: 100 | 90 days supply | Qty: 270 | Fill #0

## 2019-03-06 MED FILL — traZODone HCL 50 MG TABS: 50 | 90 days supply | Qty: 90 | Fill #0

## 2019-03-13 ENCOUNTER — Ambulatory Visit (HOSPITAL_COMMUNITY): Payer: Medicare Other | Admitting: Psychiatry

## 2019-03-13 DIAGNOSIS — M9904 Segmental and somatic dysfunction of sacral region: Secondary | ICD-10-CM | POA: Diagnosis not present

## 2019-03-27 ENCOUNTER — Ambulatory Visit (HOSPITAL_COMMUNITY): Payer: Medicare Other | Admitting: Psychiatry
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
## 2019-04-03 ENCOUNTER — Other Ambulatory Visit: Payer: Self-pay

## 2019-04-03 ENCOUNTER — Ambulatory Visit (INDEPENDENT_AMBULATORY_CARE_PROVIDER_SITE_OTHER): Payer: Medicare Other | Admitting: Psychiatry

## 2019-04-03 ENCOUNTER — Encounter (HOSPITAL_COMMUNITY): Payer: Self-pay | Admitting: Psychiatry

## 2019-04-03 DIAGNOSIS — F330 Major depressive disorder, recurrent, mild: Secondary | ICD-10-CM

## 2019-04-03 MED ORDER — DULOXETINE HCL 60 MG PO CPEP: 60.0000 mg | ORAL_CAPSULE | Freq: Every day | ORAL | 0 refills | Status: DC

## 2019-04-03 MED ORDER — TRAZODONE HCL 50 MG PO TABS: 50.0000 mg | ORAL_TABLET | Freq: Every evening | ORAL | 0 refills | Status: DC | PRN

## 2019-04-03 NOTE — Patient Instructions (Signed)
1.Continue duloxetine 60 mg daily  2.Continue Trazodone 50 mg at night as needed for sleep 3.Next appointment: 10/26 at 9:20

## 2019-04-09 DIAGNOSIS — M7731 Calcaneal spur, right foot: Secondary | ICD-10-CM | POA: Diagnosis not present

## 2019-04-09 DIAGNOSIS — M79671 Pain in right foot: Secondary | ICD-10-CM | POA: Diagnosis not present

## 2019-04-10 ENCOUNTER — Ambulatory Visit (HOSPITAL_COMMUNITY): Payer: Medicare Other | Admitting: Psychiatry

## 2019-04-10 ENCOUNTER — Other Ambulatory Visit: Payer: Self-pay

## 2019-04-11 ENCOUNTER — Ambulatory Visit (HOSPITAL_COMMUNITY): Payer: Medicare Other | Admitting: Psychiatry

## 2019-04-11 ENCOUNTER — Other Ambulatory Visit: Payer: Self-pay

## 2019-04-11 DIAGNOSIS — F330 Major depressive disorder, recurrent, mild: Secondary | ICD-10-CM
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
## 2019-04-12 DIAGNOSIS — M9904 Segmental and somatic dysfunction of sacral region: Secondary | ICD-10-CM | POA: Diagnosis not present

## 2019-04-12 MED FILL — CARTIA XT 120 MG CP24: 120 | 30 days supply | Qty: 30 | Fill #3

## 2019-04-19 ENCOUNTER — Encounter: Payer: Self-pay | Admitting: Cardiovascular Disease

## 2019-04-19 ENCOUNTER — Telehealth (INDEPENDENT_AMBULATORY_CARE_PROVIDER_SITE_OTHER): Payer: Medicare Other | Admitting: Cardiovascular Disease

## 2019-04-19 ENCOUNTER — Ambulatory Visit: Payer: Self-pay | Admitting: Orthopedic Surgery

## 2019-04-19 VITALS — BP 84/62 | HR 80 | Ht 67.5 in | Wt 154.0 lb

## 2019-04-19 DIAGNOSIS — I25118 Atherosclerotic heart disease of native coronary artery with other forms of angina pectoris: Secondary | ICD-10-CM | POA: Diagnosis not present

## 2019-04-19 DIAGNOSIS — E785 Hyperlipidemia, unspecified: Secondary | ICD-10-CM

## 2019-04-19 DIAGNOSIS — I83893 Varicose veins of bilateral lower extremities with other complications: Secondary | ICD-10-CM

## 2019-04-19 NOTE — Progress Notes (Signed)
Virtual Visit via Telephone Note   This visit type was conducted due to national recommendations for restrictions regarding the COVID-19 Pandemic (e.g. social distancing) in an effort to limit this patient's exposure and mitigate transmission in our community.  Due to her co-morbid illnesses, this patient is at least at moderate risk for complications without adequate follow up.  This format is felt to be most appropriate for this patient at this time.  The patient did not have access to video technology/had technical difficulties with video requiring transitioning to audio format only (telephone).  All issues noted in this document were discussed and addressed.  No physical exam could be performed with this format.  Please refer to the patient's chart for her  consent to telehealth for [facility].   Date:  04/19/2019   ID:  Stacey Lawrence, DOB 1951/02/07, MRN [id number]  Patient Location: Home Provider Location: Home  PCP:  Celene Squibb, MD  Cardiologist:  Kate Sable, MD  Electrophysiologist:  None   Evaluation Performed:  Follow-Up Visit  Chief Complaint:  CAD  History of Present Illness:    Stacey Lawrence is a 68 y.o. female with CAD.  She was hospitalized earlier this year at [facility] for unstable angina. Coronary CT angiography showed no high grade obstructive disease. 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 Coronary calcium score was 0. 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  She was started on Cardizem CD 120 mg daily for possible microvascular dysfunction. Toprol-XL was discontinued during hospitalization. She had adverse reactions to nitro.  She has ankle and feet swelling if she's been up on them for a while (chronic). She has bone spur on the right foot as it's pressing on the Achilles tendon. She's wearing a right foot boot.  She is undergoing SI joint fusion on 05/02/19.  She very seldom has chest pain,  particularly when she's very tired, upset, or anxious. She's been outdoors gardening most days.  The patient does not have symptoms concerning for COVID-19 infection (fever, chills, cough, or new shortness of breath).    Past Medical History:  Diagnosis Date  . Anemia    hx  . Anxiety   . CAD (coronary artery disease)    a. 12/2012 NSTEMI/Cath: LM anomalous, arising in R cusp ant to RCA, otw nl, LAD 79m with bridge, RI nl, LCX nl, OM2 small, subtl occl w/ thrombus distally - felt to be spont dissection, too small for PCI->Med Rx, RCA large, dom, nl, PDA/PD nl, EF 55% w/ focal HK in mid-dist antlat wall;  b. 05/2013 Lexi CL: EF 77%, old small inferolat scar, no ischemia.  . Common migraine with intractable migraine 06/16/2017  . Depression   . Fibromyalgia   . GERD (gastroesophageal reflux disease)    hasn't started taking any meds  . History of blood transfusion    79yrs ago  . History of kidney stones   . Hx of colonic polyps   . IBS (irritable bowel syndrome)    constipation  . Joint pain   . Joint swelling   . Migraine    "q other week" (02/04/2016)  . Myocardial infarction ([place])   . Numbness    right leg  . Polycythemia   . PONV (postoperative nausea and vomiting)   . Raynaud's disease   . Scoliosis   . Shortness of breath dyspnea    with exertion   Past Surgical History:  Procedure  Laterality Date  . ABDOMINAL HYSTERECTOMY     partial  . APPENDECTOMY     with explor. lap.  Marland Kitchen CARDIAC CATHETERIZATION    . CHOLECYSTECTOMY  10/28/2002   lap.  . COLONOSCOPY    . DEBRIDEMENT TENNIS ELBOW    . DIAGNOSTIC LAPAROSCOPY    . DILATION AND CURETTAGE OF UTERUS    . ELBOW SURGERY     golfer's elbow-bilateral  . ESOPHAGOGASTRODUODENOSCOPY  09/10/2012   Procedure: ESOPHAGOGASTRODUODENOSCOPY (EGD);  Surgeon: Rogene Houston, MD;  [facility];  Service: Endoscopy;  Laterality: N/A;  730  . EXCISION/RELEASE BURSA HIP Left 02/04/2016   Procedure: LEFT HIP TROCHANTERIC  BURSECTOMY;  Surgeon: Mcarthur Rossetti, MD;  Location: [place];  Service: Orthopedics;  Laterality: Left;  . I&D EXTREMITY  02/25/2012   Procedure: IRRIGATION AND DEBRIDEMENT EXTREMITY;  Surgeon: Roseanne Kaufman, MD;  Location: [place];  Service: Orthopedics;  Laterality: Right;  Irrigation and Debridement and Repair Right Thumb Nerve Laceration and Assoiated Structures   . KNEE DEBRIDEMENT     right  . LAPAROTOMY    . LEFT HEART CATHETERIZATION WITH CORONARY ANGIOGRAM N/A 01/16/2013   Procedure: LEFT HEART CATHETERIZATION WITH CORONARY ANGIOGRAM;  Surgeon: Jolaine Artist, MD;  Location: [facility] CATH LAB;  Service: Cardiovascular;  Laterality: N/A;  . STAPEDECTOMY     right  . TONSILLECTOMY AND ADENOIDECTOMY    . TRIGGER FINGER RELEASE  08/19/2011   Procedure: RELEASE TRIGGER FINGER/A-1 PULLEY;  Surgeon: Willa Frater III;  Location: [place];  Service: Orthopedics;  Laterality: Right;  right middle finger a-1 pulley release with tenosynovectomy  . TROCHANTERIC BURSA EXCISION Right 02/04/2016  . TUBAL LIGATION    . TYMPANOPLASTY       Current Meds  Medication Sig  . acetaminophen (TYLENOL) 325 MG tablet Take 2 tablets (650 mg total) by mouth every 4 (four) hours as needed for headache or mild pain.  . Ascorbic Acid (VITAMIN C PO) Take 2 tablets by mouth daily.  Marland Kitchen aspirin 81 MG chewable tablet Chew 81 mg by mouth at bedtime.   . Biotin (BIOTIN 5000) 5 MG CAPS Take 1 capsule by mouth daily.  . Calcium Citrate-Vitamin D (CALCITRATE/VITAMIN D PO) Take 1 tablet by mouth daily.  . carisoprodol (SOMA) 350 MG tablet Take 350 mg by mouth daily as needed for muscle spasms.   . celecoxib (CELEBREX) 200 MG capsule Take 200 mg by mouth daily.   Marland Kitchen diltiazem (CARDIZEM CD) 120 MG 24 hr capsule Take 1 capsule (120 mg total) by mouth daily.  Derrill Memo ON 06/04/2019] DULoxetine (CYMBALTA) 60 MG capsule Take 1 capsule (60 mg total) by mouth daily.  Marland Kitchen gabapentin (NEURONTIN) 100 MG capsule Take  1 capsule (100 mg total) by mouth 3 (three) times daily.  . nitroGLYCERIN (NITROSTAT) 0.4 MG SL tablet PLACE 1 TABLET UNDER THE TONGUE EVERY 5 MINUTES AS NEEDED FOR CHEST PAIN (Patient taking differently: Place 0.4 mg under the tongue every 5 (five) minutes as needed. )  . promethazine (PHENERGAN) 25 MG tablet Take 25 mg by mouth every 6 (six) hours as needed for nausea or vomiting.  Derrill Memo ON 06/06/2019] traZODone (DESYREL) 50 MG tablet Take 1 tablet (50 mg total) by mouth at bedtime as needed for sleep.     Allergies:   Dilaudid [hydromorphone hcl], Codeine, and Morphine and related   Social History   Tobacco Use  . Smoking status: Never Smoker  . Smokeless tobacco: Never Used  Substance Use Topics  . Alcohol use: No    Alcohol/week: 0.0 standard drinks  . Drug use: No     Family Hx: The patient's family history includes ADD / ADHD in her son; COPD in her mother; Cancer in her father and mother; Depression in her brother, father, and mother; Heart disease in her father and mother; Heart failure in her mother; Varicose Veins in her father and mother.  ROS:   Please see the history of present illness.     All other systems reviewed and are negative.   Prior CV studies:   The following studies were reviewed today:  Coronary CTA (01/02/19):  IMPRESSION: 1. Coronary calcium score of 0. This was 0 percentile for age and sex matched control.  2. 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  3. 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  4. No obstructive coronary artery disease.  5. Mild atherosclerosis of the descending aorta.  Left heart catheterization 01/16/2013 Findings:  Ao Pressure: 129/78 (102) LV Pressure: 126/2/8 There was no signficant gradient across the aortic valve on pullback.  Left main: Anomalous. Arising in the right cusp anterior to RCA origin. Long. No stenosis. Gives off LAD, Ramus and LCX.   LAD: Gives off 2 diagonals.  There is an intramyocardial segment (bridge) in the mid LAD with 30% plaque. Otherwise normal.  RAMUS: Normal  LCX: Gives off small OM-1, OM-2 and OM-3. OM-2 is a very small caliber vessel which extends out to the distal anterolateral wall and apex. In the middle to distal OM-2 the vessel is subtotally occluded with thrombus  RCA: Large dominant vessel gives off large PDA and PL.  LV-gram done in the RAO projection: Ejection fraction = 55% focal area of hypokinesis in mid to distal anterolateral wall  Assessment: 1. CAD with occlusion of a small OM-2 2. 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 EF 55% with focal area of hypokinesis in mid to distal anterolateral wall  Plan/Discussion:  The OM-2 is too small for PCI. Will treat medically with aggressive RF modification. Start Plavix. ______________________________  2D echocardiogram 12/24/2018 Katherine Basset [facility](outpatient echo) SUMMARY  Injection of agitated saline contrast performed to evaluate for possible shunt  The left ventricular size is normal.    Left ventricular systolic function is normal.  LV ejection fraction = 55-60%.    Left ventricular filling pattern is prolonged relaxation.  No segmental wall motion abnormalities seen in the left ventricle  The right ventricle is normal in size and function.  Injection of agitated saline showed no right-to-left shunt.  There is mild mitral regurgitation.  The aortic sinus is normal size.  The IVC is normal in size with an inspiratory collapse of greater then 50%,   suggesting normal right atrial pressure.  There is no pericardial effusion.  There is no comparison study available.  -  FINDINGS:    LEFT VENTRICLE  The left ventricular size is normal. There is normal left ventricular wall   thickness. Left ventricular systolic function is normal. LV ejection fraction   = 55-60%. Left  ventricular filling pattern is prolonged relaxation. No   segmental wall motion abnormalities seen in the left ventricle.    Labs/Other Tests and Data Reviewed:    EKG:  No ECG reviewed.  Recent Labs: 01/02/2019: BUN 17; Creatinine, Ser 0.90; Hemoglobin 15.4; Platelets 227; Potassium 4.1; Sodium 136   Recent Lipid Panel Lab Results  Component Value Date/Time  CHOL 176 01/02/2019 02:27 AM   TRIG 53 01/02/2019 02:27 AM   HDL 80 01/02/2019 02:27 AM   CHOLHDL 2.2 01/02/2019 02:27 AM   LDLCALC 85 01/02/2019 02:27 AM    Wt Readings from Last 3 Encounters:  04/19/19 154 lb (69.9 kg)  01/17/19 158 lb (71.7 kg)  01/01/19 158 lb (71.7 kg)     Objective:    Vital Signs:  BP (!) 84/62   Pulse 80   Ht 5' 7.5" (1.715 m)   Wt 154 lb (69.9 kg)   BMI 23.76 kg/m    VITAL SIGNS:  reviewed  ASSESSMENT & PLAN:    1. AQT:MAUQJFH of myocardial infarction in 2014 secondary to spontaneous coronary artery dissection of a small second obtuse marginal branch that was treated medically. She was also noted at that time to have a 30% mid LAD lesion but this was likely related to intramyocardial bridging. Symptoms are stable on Cardizem CD 120 mg daily. Likely has some degree of microvascular dysfunction given her other vasospastic disease (Raynaud's).   2. HLD: PCP stopped atorvastatin 80 mg. LDL 85 on 01/02/19.  3. Venous varicosities:Continues compression stocking use, 30-40 mmHg.   COVID-19 Education: The signs and symptoms of COVID-19 were discussed with the patient and how to seek care for testing (follow up with PCP or arrange E-visit).  The importance of social distancing was discussed today.  Time:   Today, I have spent 10 minutes with the patient with telehealth technology discussing the above problems.     Medication Adjustments/Labs and Tests Ordered: Current medicines are reviewed at length with the patient today.  Concerns regarding medicines are outlined above.   Tests  Ordered: No orders of the defined types were placed in this encounter.   Medication Changes: No orders of the defined types were placed in this encounter.   Follow Up:  Virtual Visit or In Person in 6 month(s)  Signed, Kate Sable, MD  04/19/2019 9:28 AM    [place]

## 2019-04-19 NOTE — Patient Instructions (Signed)
Medication Instructions: Your physician recommends that you continue on your current medications as directed. Please refer to the Current Medication list given to you today.   Labwork: none  Procedures/Testing: none  Follow-Up: 6 months with Dr.Koneswaran  Any Additional Special Instructions Will Be Listed Below (If Applicable).     If you need a refill on your cardiac medications before your next appointment, please call your pharmacy.     Thank you for choosing [facility] !        

## 2019-04-23 MED FILL — CARISOPRODOL 350 MG TABS: 350 | 20 days supply | Qty: 60 | Fill #1

## 2019-04-23 MED FILL — CELECOXIB 200 MG CAPSULE: 200 | 30 days supply | Qty: 30 | Fill #0

## 2019-04-24 MED FILL — PROMETHAZINE 25 MG TABLET: 25 | 7 days supply | Qty: 30 | Fill #0

## 2019-04-26 NOTE — Pre-Procedure Instructions (Signed)
   ASIAH BROWDER  04/26/2019     [place], [name] - [address] [facility] [address] [name] [address] Phone: [phone] Fax: [place], [name] - [address] [name] [address] Phone: [phone] Fax: [phone]   Your procedure is scheduled on Thursday, May 02, 2019  Report to [facility] Admitting at 11:55 A.M.  Call this number if you have problems the morning of surgery:  [phone]   Remember: Brush your teeth the morning of surgery with your regular toothpaste.   Do not eat or drink after midnight Wednesday, May 01, 2019    Take these medicines the morning of surgery with A SIP OF WATER :  diltiazem (CARDIZEM ),    DULoxetine (CYMBALTA),       gabapentin (NEURONTIN),   If needed: acetaminophen (TYLENOL) for pain or headache  If needed: promethazine (PHENERGAN) for nausea or vomiting  Stop taking Aspirin (unless otherwise advised by surgeon), vitamins, fish oil, Biotin,  and herbal medications. Do not take any NSAIDs [id number] (CELEBREX),  Ibuprofen, Advil, Naproxen (Aleve), Motrin, BC and Goody Powder.   Do not wear jewelry, make-up or nail polish.  Do not wear lotions, powders, or perfumes, or deodorant.  Do not shave 48 hours prior to surgery.    Do not bring valuables to the hospital.  [facility] is not responsible for any belongings or valuables.  Contacts, dentures or bridgework may not be worn into surgery.  Leave your suitcase in the car.  After surgery it may be brought to your room.  For patients admitted to the hospital, discharge time will be determined by your treatment team.  Patients discharged the day of surgery will not be allowed to drive home.  Special instructions: Shower the night before and morning of surgery with CHG.  Please read over the following fact sheets that you were given. Pain  Booklet, Coughing and Deep Breathing and Surgical Site Infection Prevention

## 2019-04-29 ENCOUNTER — Encounter (HOSPITAL_COMMUNITY): Payer: Self-pay

## 2019-04-29 ENCOUNTER — Other Ambulatory Visit (HOSPITAL_COMMUNITY)
Admission: RE | Admit: 2019-04-29 | Discharge: 2019-04-29 | Disposition: A | Discharge status: Home / Self Care | Payer: Medicare Other | Source: Ambulatory Visit | Attending: Orthopedic Surgery | Admitting: Orthopedic Surgery

## 2019-04-29 ENCOUNTER — Encounter (HOSPITAL_COMMUNITY)
Admission: RE | Admit: 2019-04-29 | Discharge: 2019-04-29 | Disposition: A | Discharge status: Home / Self Care | Payer: Medicare Other | Source: Ambulatory Visit | Attending: Orthopedic Surgery | Admitting: Orthopedic Surgery

## 2019-04-29 ENCOUNTER — Other Ambulatory Visit: Payer: Self-pay

## 2019-04-29 DIAGNOSIS — Z86010 Personal history of colonic polyps: Secondary | ICD-10-CM | POA: Insufficient documentation

## 2019-04-29 DIAGNOSIS — K219 Gastro-esophageal reflux disease without esophagitis: Secondary | ICD-10-CM | POA: Diagnosis not present

## 2019-04-29 DIAGNOSIS — M797 Fibromyalgia: Secondary | ICD-10-CM | POA: Insufficient documentation

## 2019-04-29 DIAGNOSIS — M533 Sacrococcygeal disorders, not elsewhere classified: Secondary | ICD-10-CM | POA: Insufficient documentation

## 2019-04-29 DIAGNOSIS — K589 Irritable bowel syndrome without diarrhea: Secondary | ICD-10-CM | POA: Diagnosis not present

## 2019-04-29 DIAGNOSIS — Z7982 Long term (current) use of aspirin: Secondary | ICD-10-CM | POA: Diagnosis not present

## 2019-04-29 DIAGNOSIS — Z87442 Personal history of urinary calculi: Secondary | ICD-10-CM | POA: Insufficient documentation

## 2019-04-29 DIAGNOSIS — I2510 Atherosclerotic heart disease of native coronary artery without angina pectoris: Secondary | ICD-10-CM | POA: Diagnosis not present

## 2019-04-29 DIAGNOSIS — Z79899 Other long term (current) drug therapy: Secondary | ICD-10-CM | POA: Diagnosis not present

## 2019-04-29 DIAGNOSIS — Z20828 Contact with and (suspected) exposure to other viral communicable diseases: Secondary | ICD-10-CM | POA: Diagnosis not present

## 2019-04-29 DIAGNOSIS — Z01818 Encounter for other preprocedural examination: Secondary | ICD-10-CM | POA: Diagnosis not present

## 2019-04-29 DIAGNOSIS — I252 Old myocardial infarction: Secondary | ICD-10-CM | POA: Diagnosis not present

## 2019-04-29 DIAGNOSIS — D751 Secondary polycythemia: Secondary | ICD-10-CM | POA: Insufficient documentation

## 2019-04-29 HISTORY — DX: Sacrococcygeal disorders, not elsewhere classified: M53.3

## 2019-04-29 HISTORY — DX: Presence of spectacles and contact lenses: Z97.3

## 2019-04-29 LAB — URINALYSIS, COMPLETE (UACMP) WITH MICROSCOPIC
Bilirubin Urine: NEGATIVE
Glucose, UA: NEGATIVE mg/dL
Hgb urine dipstick: NEGATIVE
Ketones, ur: NEGATIVE mg/dL
Nitrite: NEGATIVE
Protein, ur: NEGATIVE mg/dL
Specific Gravity, Urine: 1.02 (ref 1.005–1.030)
WBC, UA: >50 WBC/hpf — ABNORMAL HIGH (ref 0–5)
pH: 5 (ref 5.0–8.0)

## 2019-04-29 LAB — COMPREHENSIVE METABOLIC PANEL
ALT: 11 U/L (ref 0–44)
AST: 20 U/L (ref 15–41)
Albumin: 3.9 g/dL (ref 3.5–5.0)
Alkaline Phosphatase: 99 U/L (ref 38–126)
Anion gap: 10 (ref 5–15)
BUN: 12 mg/dL (ref 8–23)
CO2: 27 mmol/L (ref 22–32)
Calcium: 9.3 mg/dL (ref 8.9–10.3)
Chloride: 105 mmol/L (ref 98–111)
Creatinine, Ser: 0.94 mg/dL (ref 0.44–1.00)
GFR calc Af Amer: >60 mL/min (ref >60)
GFR calc non Af Amer: >60 mL/min (ref >60)
Glucose, Bld: 87 mg/dL (ref 70–99)
Potassium: 3.7 mmol/L (ref 3.5–5.1)
Sodium: 142 mmol/L (ref 135–145)
Total Bilirubin: 0.6 mg/dL (ref 0.3–1.2)
Total Protein: 6.7 g/dL (ref 6.5–8.1)

## 2019-04-29 LAB — TYPE AND SCREEN
ABO/RH(D): O NEG
Antibody Screen: NEGATIVE

## 2019-04-29 LAB — CBC WITH DIFFERENTIAL/PLATELET
Abs Immature Granulocytes: 0.01 10*3/uL (ref 0.00–0.07)
Basophils Absolute: 0.1 10*3/uL (ref 0.0–0.1)
Basophils Relative: 2 %
Eosinophils Absolute: 0.2 10*3/uL (ref 0.0–0.5)
Eosinophils Relative: 4 %
HCT: 46.8 % — ABNORMAL HIGH (ref 36.0–46.0)
Hemoglobin: 14.5 g/dL (ref 12.0–15.0)
Immature Granulocytes: 0 %
Lymphocytes Relative: 33 %
Lymphs Abs: 1.6 10*3/uL (ref 0.7–4.0)
MCH: 28.8 pg (ref 26.0–34.0)
MCHC: 31 g/dL (ref 30.0–36.0)
MCV: 92.9 fL (ref 80.0–100.0)
Monocytes Absolute: 0.5 10*3/uL (ref 0.1–1.0)
Monocytes Relative: 10 %
Neutro Abs: 2.6 10*3/uL (ref 1.7–7.7)
Neutrophils Relative %: 51 %
Platelets: 247 10*3/uL (ref 150–400)
RBC: 5.04 MIL/uL (ref 3.87–5.11)
RDW: 12.9 % (ref 11.5–15.5)
WBC: 5 10*3/uL (ref 4.0–10.5)
nRBC: 0 % (ref 0.0–0.2)

## 2019-04-29 LAB — PROTIME-INR
INR: 1.1 (ref 0.8–1.2)
Prothrombin Time: 13.9 seconds (ref 11.4–15.2)

## 2019-04-29 LAB — ABO/RH: ABO/RH(D): O NEG

## 2019-04-29 LAB — SURGICAL PCR SCREEN
MRSA, PCR: NEGATIVE
Staphylococcus aureus: NEGATIVE

## 2019-04-29 LAB — APTT: aPTT: 29 seconds (ref 24–36)

## 2019-04-29 LAB — SARS CORONAVIRUS 2 (TAT 6-24 HRS): SARS Coronavirus 2: NEGATIVE

## 2019-04-29 MED FILL — SULFAMETHOXAZOLE-TMP DS TAB: 800-160 | 2 days supply | Qty: 4 | Fill #0

## 2019-04-29 NOTE — Progress Notes (Signed)
Nurse lvm on office voice mailbox to make MD aware of abnormal UA ( Hazy, many bacteria and large leukocytes ); awaiting a follow up call.

## 2019-04-29 NOTE — Progress Notes (Signed)
Pt denies SOB and chest pain. Pt stated that she is under the care of Dr. Bronson Ing, Cardiology and Dr. Sharen Heck, PCP. Pt denies recent labs.  Pt denies recent labs.  Pt denies that she and family members tested positive for COVID-19; pt scheduled to be tested on 04/29/19 and reminded to quarantine.  Coronavirus Screening  Pt denies that she and family members experienced the following symptoms:  Cough yes/no: No Fever (>100.47F)  yes/no: No Runny nose yes/no: No Sore throat yes/no: No Difficulty breathing/shortness of breath  yes/no: No  Have you or a family member traveled in the last 14 days and where? yes/no: No  Pt reminded that hospital visitation restrictions are in effect and the importance of the restrictions.   Pt verbalized understanding of all pre-op instructions.  Pt chart forwarded to PA, Anesthesiology, for review ( cardiac hx, abnormal labs and order for consult ).

## 2019-04-30 NOTE — Anesthesia Preprocedure Evaluation (Addendum)
Anesthesia Evaluation  Patient identified by MRN, date of birth, ID band Patient awake    Reviewed: Allergy & Precautions, NPO status , Patient's Chart, lab work & pertinent test results  History of Anesthesia Complications (+) PONV  Airway Mallampati: II  TM Distance: >3 FB     Dental   Pulmonary    breath sounds clear to auscultation       Cardiovascular + angina + CAD and + Past MI   Rhythm:Regular Rate:Normal     Neuro/Psych    GI/Hepatic Neg liver ROS, GERD  ,  Endo/Other  negative endocrine ROS  Renal/GU negative Renal ROS     Musculoskeletal   Abdominal   Peds  Hematology  (+) anemia ,   Anesthesia Other Findings   Reproductive/Obstetrics                             Anesthesia Physical Anesthesia Plan  ASA: III  Anesthesia Plan: General   Post-op Pain Management:    Induction: Intravenous  PONV Risk Score and Plan: 4 or greater and Ondansetron, Dexamethasone and Midazolam  Airway Management Planned: Oral ETT  Additional Equipment:   Intra-op Plan:   Post-operative Plan: Extubation in OR  Informed Consent:     Dental advisory given  Plan Discussed with: CRNA and Anesthesiologist  Anesthesia Plan Comments: (PAT note written 04/30/2019 by Myra Gianotti, PA-C. )       Anesthesia Quick Evaluation

## 2019-04-30 NOTE — Progress Notes (Signed)
Anesthesia Chart Review:  Case: [id number] Date/Time: 05/02/19 1034   Procedure: SACROILIAC JOINT FUSION (Right ) - 90 mins   Anesthesia type: General   Pre-op diagnosis: Right sacroiliac joint dysfunction   Location: [facility] ROOM 82 / [facility] OR   Surgeon: Melina Schools, MD      DISCUSSION: Patient is a 68 year old female scheduled for the above procedure.  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   Patient evaluated by cardiologist Dr. Bronson Ing on 04/19/19 for follow-up. He was aware of upcoming surgery. Was hospitalized 12/2018 for chest pain with coronary CT showing no high grade disease. Recent echo showed normal LVF, no significant valvular abnormalities. She was started on Cardizem CD for possible microvascular dysfunction given other vasospastic disease (Raynaud's). Toprol discontinued. Six month follow-up recommended.   04/29/2019 presurgical COVID-19 test was negative.  If no acute changes and I would anticipate that she could proceed as planned.   VS: BP (!) 106/58   Pulse 82   Temp 36.6 C   Resp 20   Ht 5' 6"  (1.676 m)   Wt 68.5 kg   SpO2 96%   BMI 24.39 kg/m    PROVIDERS: Celene Squibb, MD is PCP  - Kate Sable, MD is cardiologist - Dario Ave, MD is hematologist. Seen on 12/24/18 for polycythemia. Note state, "Due to the absence of JAK mutations and normal bone marrow biopsy, do not feel patient has an underlying bone marrow disorder." As needed follow-up recommended.   - She is followed by [facility] for migraines and essential tremor.   LABS: Preoperative labs noted. UA results as below. Per Ortho staff,  patient was called in Bactrim.  (all labs ordered are listed, but only abnormal results are displayed)  Labs Reviewed  CBC WITH DIFFERENTIAL/PLATELET - Abnormal; Notable for the following components:      Result Value   HCT 46.8 (*)    All other components within normal limits  URINALYSIS, COMPLETE (UACMP) WITH MICROSCOPIC - Abnormal; Notable for the following components:   APPearance HAZY (*)    Leukocytes,Ua LARGE (*)    WBC, UA >50 (*)    Bacteria, UA MANY (*)    Non Squamous Epithelial 0-5 (*)    All other components within normal limits  SURGICAL PCR SCREEN  APTT  COMPREHENSIVE METABOLIC PANEL  PROTIME-INR  TYPE AND SCREEN  ABO/RH     IMAGES: CXR 01/01/19: FINDINGS: The heart size and mediastinal contours are within normal limits. Both lungs are clear. No pneumothorax or pleural effusion is noted. The visualized skeletal structures are unremarkable. IMPRESSION: No active cardiopulmonary disease.   EKG: 01/01/19: Sinus rhythm Borderline prolonged PR interval  Confirmed by Gerlene Fee [phone]) on 01/01/2019 11:09:33 AM   CV: CT coronary 01/02/19: IMPRESSION: 1. Coronary calcium score of 0. This was 0 percentile for age and sex matched control. 2. 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 3.  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 4.  No obstructive coronary artery disease. 5.  Mild atherosclerosis of the descending aorta.   Echo 12/24/18 [facility]): SUMMARY Injection of agitated saline contrast performed to evaluate for possible shunt The left ventricular size is normal. Left ventricular systolic  function is normal. LV ejection fraction = 55-60%. Left ventricular filling pattern is prolonged relaxation. No segmental wall motion abnormalities seen in the left ventricle The right ventricle is normal in size and function. Injection of agitated saline showed no right-to-left shunt. There is mild mitral regurgitation. The aortic sinus is  normal size. The IVC is normal in size with an inspiratory collapse of greater then 50%,  suggesting normal right atrial pressure. There is no pericardial effusion. There is no comparison study available.   Cardiac event monitor 04/06/17-04/19/17: Study Highlights:  Normal sinus rhythm. No arrhythmias.  Symptoms correlated with above.   Nuclear stress test 06/04/13: Impression: Low risk study. Old small inferolateral scar. No ischemia. Normal LV systolic function. EF 77%.   Cardiac cath 01/16/13 (in setting of NSTEMI):  Findings: Ao Pressure: 129/78 (102) LV Pressure: 126/2/8 There was no signficant gradient across the aortic valve on pullback. Left main: Anomalous. Arising in the right cusp anterior to RCA origin. Long. No stenosis. Gives off LAD, Ramus and LCX.  LAD: Gives off 2 diagonals. There is an intramyocardial segment (bridge) in the mid LAD with 30% plaque. Otherwise normal. RAMUS: Normal LCX: Gives off small OM-1, OM-2 and OM-3. OM-2 is a very small caliber vessel which extends out to the distal anterolateral wall and apex. In the middle to distal OM-2 the vessel is subtotally occluded with thrombus RCA: Large dominant vessel gives off large PDA and PL. LV-gram done in the RAO projection: Ejection fraction = 55% focal area of hypokinesis in mid to distal anterolateral wall Assessment: 1. CAD with occlusion of a small OM-2 2. 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 EF 55% with focal area of hypokinesis in mid to distal anterolateral wall Plan/Discussion: The OM-2 is too small for PCI. Will treat medically with aggressive RF modification. Start Plavix (Bensimhon, Daniel, MD). (Cardiology note also state, "Films were reviewed and it was felt that her distal obtuse marginal disease likely represented spontaneous coronary artery dissection however because of the small size of the vessel, continue medical therapy was  recommended.")    Past Medical History:  Diagnosis Date  . Anemia    hx  . Anxiety   . CAD (coronary artery disease)    a. 12/2012 NSTEMI/Cath: LM anomalous, arising in R cusp ant to RCA, otw nl, LAD 41mwith bridge, RI nl, LCX nl, OM2 small, subtl occl w/ thrombus distally - felt to be spont dissection, too small for PCI->Med Rx, RCA large, dom, nl, PDA/PD nl, EF 55% w/ focal HK in mid-dist antlat wall;  b. 05/2013 Lexi CL: EF 77%, old small inferolat scar, no ischemia.  . Common migraine with intractable migraine 06/16/2017  . Depression   . Fibromyalgia   . GERD (gastroesophageal reflux disease)    hasn't started taking any meds  . History of blood transfusion    257yrago  . History of kidney stones   . Hx of colonic polyps   . IBS (irritable bowel syndrome)    constipation  . Joint pain   . Joint swelling   . Migraine    "q other week" (02/04/2016)  . [place]  . Numbness    right leg  . Polycythemia   . PONV (postoperative nausea and vomiting)   . Raynaud's disease   . Scoliosis   . Shortness of breath dyspnea    with exertion  . SI (sacroiliac) joint dysfunction    right  .  Wears glasses     Past Surgical History:  Procedure Laterality Date  . ABDOMINAL HYSTERECTOMY     partial  . APPENDECTOMY     with explor. lap.  Marland Kitchen CARDIAC CATHETERIZATION    . CHOLECYSTECTOMY  10/28/2002   lap.  . COLONOSCOPY    . DEBRIDEMENT TENNIS ELBOW    . DIAGNOSTIC LAPAROSCOPY    . DILATION AND CURETTAGE OF UTERUS    . ELBOW SURGERY     golfer's elbow-bilateral  . ESOPHAGOGASTRODUODENOSCOPY  09/10/2012   Procedure: ESOPHAGOGASTRODUODENOSCOPY (EGD);  Surgeon: Rogene Houston, MD;  [facility];  Service: Endoscopy;  Laterality: N/A;  730  . EXCISION/RELEASE BURSA HIP Left 02/04/2016   Procedure: LEFT HIP TROCHANTERIC BURSECTOMY;  Surgeon: Mcarthur Rossetti, MD;  Location: [place];  Service: Orthopedics;  Laterality: Left;  . I&D EXTREMITY  02/25/2012    Procedure: IRRIGATION AND DEBRIDEMENT EXTREMITY;  Surgeon: Roseanne Kaufman, MD;  Location: [place];  Service: Orthopedics;  Laterality: Right;  Irrigation and Debridement and Repair Right Thumb Nerve Laceration and Assoiated Structures   . KNEE DEBRIDEMENT     right  . LAPAROTOMY    . LEFT HEART CATHETERIZATION WITH CORONARY ANGIOGRAM N/A 01/16/2013   Procedure: LEFT HEART CATHETERIZATION WITH CORONARY ANGIOGRAM;  Surgeon: Jolaine Artist, MD;  Location: [facility] CATH LAB;  Service: Cardiovascular;  Laterality: N/A;  . STAPEDECTOMY     right  . TONSILLECTOMY AND ADENOIDECTOMY    . TRIGGER FINGER RELEASE  08/19/2011   Procedure: RELEASE TRIGGER FINGER/A-1 PULLEY;  Surgeon: Willa Frater III;  Location: [place];  Service: Orthopedics;  Laterality: Right;  right middle finger a-1 pulley release with tenosynovectomy  . TROCHANTERIC BURSA EXCISION Right 02/04/2016  . TUBAL LIGATION    . TYMPANOPLASTY      MEDICATIONS: . acetaminophen (TYLENOL) 325 MG tablet  . Ascorbic Acid (VITAMIN C PO)  . aspirin 81 MG EC tablet  . Biotin (BIOTIN 5000) 5 MG CAPS  . Calcium Citrate-Vitamin D (CALCITRATE/VITAMIN D PO)  . carisoprodol (SOMA) 350 MG tablet  . celecoxib (CELEBREX) 200 MG capsule  . diltiazem (CARDIZEM CD) 120 MG 24 hr capsule  . [START ON 06/04/2019] DULoxetine (CYMBALTA) 60 MG capsule  . gabapentin (NEURONTIN) 100 MG capsule  . nitroGLYCERIN (NITROSTAT) 0.4 MG SL tablet  . promethazine (PHENERGAN) 25 MG tablet  . [START ON 06/06/2019] traZODone (DESYREL) 50 MG tablet   No current facility-administered medications for this encounter.   ASA on hold for surgery.   Myra Gianotti, PA-C Surgical Short Stay/Anesthesiology [facility] Phone [phone] [facility] Phone [phone] 04/30/2019 12:44 PM

## 2019-05-02 ENCOUNTER — Ambulatory Visit (HOSPITAL_COMMUNITY): Payer: Medicare Other | Admitting: Certified Registered Nurse Anesthetist

## 2019-05-02 ENCOUNTER — Ambulatory Visit (HOSPITAL_COMMUNITY): Payer: Medicare Other

## 2019-05-02 ENCOUNTER — Encounter (HOSPITAL_COMMUNITY): Payer: Self-pay

## 2019-05-02 ENCOUNTER — Encounter (HOSPITAL_COMMUNITY)
Admission: RE | Disposition: A | Discharge status: Home / Self Care | Payer: Self-pay | Source: Ambulatory Visit | Attending: Orthopedic Surgery

## 2019-05-02 ENCOUNTER — Ambulatory Visit (HOSPITAL_COMMUNITY)
Admission: RE | Admit: 2019-05-02 | Discharge: 2019-05-02 | Disposition: A | Discharge status: Home / Self Care | Payer: Medicare Other | Source: Ambulatory Visit | Attending: Orthopedic Surgery | Admitting: Orthopedic Surgery

## 2019-05-02 ENCOUNTER — Ambulatory Visit (HOSPITAL_COMMUNITY): Payer: Medicare Other | Admitting: Vascular Surgery

## 2019-05-02 ENCOUNTER — Other Ambulatory Visit: Payer: Self-pay

## 2019-05-02 DIAGNOSIS — F329 Major depressive disorder, single episode, unspecified: Secondary | ICD-10-CM | POA: Insufficient documentation

## 2019-05-02 DIAGNOSIS — M533 Sacrococcygeal disorders, not elsewhere classified: Secondary | ICD-10-CM | POA: Diagnosis not present

## 2019-05-02 DIAGNOSIS — K219 Gastro-esophageal reflux disease without esophagitis: Secondary | ICD-10-CM | POA: Insufficient documentation

## 2019-05-02 DIAGNOSIS — M4328 Fusion of spine, sacral and sacrococcygeal region: Secondary | ICD-10-CM | POA: Diagnosis not present

## 2019-05-02 DIAGNOSIS — I252 Old myocardial infarction: Secondary | ICD-10-CM | POA: Insufficient documentation

## 2019-05-02 DIAGNOSIS — Z791 Long term (current) use of non-steroidal anti-inflammatories (NSAID): Secondary | ICD-10-CM | POA: Diagnosis not present

## 2019-05-02 DIAGNOSIS — M5136 Other intervertebral disc degeneration, lumbar region: Secondary | ICD-10-CM | POA: Insufficient documentation

## 2019-05-02 DIAGNOSIS — Z419 Encounter for procedure for purposes other than remedying health state, unspecified: Secondary | ICD-10-CM

## 2019-05-02 DIAGNOSIS — Z885 Allergy status to narcotic agent status: Secondary | ICD-10-CM | POA: Insufficient documentation

## 2019-05-02 DIAGNOSIS — F419 Anxiety disorder, unspecified: Secondary | ICD-10-CM | POA: Insufficient documentation

## 2019-05-02 DIAGNOSIS — M797 Fibromyalgia: Secondary | ICD-10-CM | POA: Insufficient documentation

## 2019-05-02 DIAGNOSIS — F418 Other specified anxiety disorders: Secondary | ICD-10-CM | POA: Diagnosis not present

## 2019-05-02 DIAGNOSIS — K589 Irritable bowel syndrome without diarrhea: Secondary | ICD-10-CM | POA: Insufficient documentation

## 2019-05-02 DIAGNOSIS — Z8249 Family history of ischemic heart disease and other diseases of the circulatory system: Secondary | ICD-10-CM | POA: Insufficient documentation

## 2019-05-02 DIAGNOSIS — I25110 Atherosclerotic heart disease of native coronary artery with unstable angina pectoris: Secondary | ICD-10-CM | POA: Diagnosis not present

## 2019-05-02 DIAGNOSIS — Z79899 Other long term (current) drug therapy: Secondary | ICD-10-CM | POA: Diagnosis not present

## 2019-05-02 DIAGNOSIS — I2510 Atherosclerotic heart disease of native coronary artery without angina pectoris: Secondary | ICD-10-CM | POA: Insufficient documentation

## 2019-05-02 HISTORY — PX: SACROILIAC JOINT FUSION: SHX6088

## 2019-05-02 SURGERY — SACROILIAC JOINT FUSION
Anesthesia: General | Site: Hip | Laterality: Right

## 2019-05-02 MED ORDER — ONDANSETRON HCL 4 MG/2ML IJ SOLN
INTRAMUSCULAR | Status: DC | PRN
  Administered 2019-05-02: 4 mg via INTRAVENOUS

## 2019-05-02 MED ORDER — EPHEDRINE 5 MG/ML INJ
INTRAVENOUS | Status: AC
  Filled 2019-05-02: qty 10

## 2019-05-02 MED ORDER — PROPOFOL 10 MG/ML IV BOLUS
INTRAVENOUS | Status: DC | PRN
  Administered 2019-05-02: 170 mg via INTRAVENOUS

## 2019-05-02 MED ORDER — LIDOCAINE 2% (20 MG/ML) 5 ML SYRINGE
INTRAMUSCULAR | Status: DC | PRN
  Administered 2019-05-02: 100 mg via INTRAVENOUS

## 2019-05-02 MED ORDER — PHENYLEPHRINE 40 MCG/ML (10ML) SYRINGE FOR IV PUSH (FOR BLOOD PRESSURE SUPPORT)
PREFILLED_SYRINGE | INTRAVENOUS | Status: DC | PRN
  Administered 2019-05-02 (×3): 80 ug via INTRAVENOUS
  Administered 2019-05-02: 40 ug via INTRAVENOUS

## 2019-05-02 MED ORDER — ROCURONIUM BROMIDE 10 MG/ML (PF) SYRINGE
PREFILLED_SYRINGE | INTRAVENOUS | Status: DC | PRN
  Administered 2019-05-02: 70 mg via INTRAVENOUS

## 2019-05-02 MED ORDER — BUPIVACAINE LIPOSOME 1.3 % IJ SUSP
INTRAMUSCULAR | Status: DC | PRN
  Administered 2019-05-02: 20 mL

## 2019-05-02 MED ORDER — SCOPOLAMINE 1 MG/3DAYS TD PT72
MEDICATED_PATCH | TRANSDERMAL | Status: DC | PRN
  Administered 2019-05-02: 1 via TRANSDERMAL

## 2019-05-02 MED ORDER — SUGAMMADEX SODIUM 200 MG/2ML IV SOLN
INTRAVENOUS | Status: DC | PRN
  Administered 2019-05-02: 150 mg via INTRAVENOUS

## 2019-05-02 MED ORDER — METHOCARBAMOL 1000 MG/10ML IJ SOLN: 500.0000 mg | Freq: Four times a day (QID) | INTRAVENOUS | Status: DC | PRN

## 2019-05-02 MED ORDER — METHOCARBAMOL 500 MG PO TABS
ORAL_TABLET | ORAL | Status: AC
  Filled 2019-05-02: qty 1

## 2019-05-02 MED ORDER — 0.9 % SODIUM CHLORIDE (POUR BTL) OPTIME
TOPICAL | Status: DC | PRN
  Administered 2019-05-02: 1000 mL

## 2019-05-02 MED ORDER — OXYCODONE HCL 5 MG PO TABS
ORAL_TABLET | ORAL | Status: AC
  Filled 2019-05-02: qty 2

## 2019-05-02 MED ORDER — FENTANYL CITRATE (PF) 100 MCG/2ML IJ SOLN
25.0000 ug | INTRAMUSCULAR | Status: DC | PRN
  Administered 2019-05-02: 50 ug via INTRAVENOUS

## 2019-05-02 MED ORDER — CEFAZOLIN SODIUM-DEXTROSE 2-4 GM/100ML-% IV SOLN
INTRAVENOUS | Status: AC
  Filled 2019-05-02: qty 100

## 2019-05-02 MED ORDER — FENTANYL CITRATE (PF) 100 MCG/2ML IJ SOLN
INTRAMUSCULAR | Status: AC
  Filled 2019-05-02: qty 2

## 2019-05-02 MED ORDER — PHENYLEPHRINE HCL (PRESSORS) 10 MG/ML IV SOLN
INTRAVENOUS | Status: AC
  Filled 2019-05-02: qty 1

## 2019-05-02 MED ORDER — BUPIVACAINE-EPINEPHRINE 0.25% -1:200000 IJ SOLN
INTRAMUSCULAR | Status: DC | PRN
  Administered 2019-05-02: 30 mL

## 2019-05-02 MED ORDER — OXYCODONE HCL 5 MG PO TABS
10.0000 mg | ORAL_TABLET | ORAL | Status: DC | PRN
  Administered 2019-05-02: 10 mg via ORAL

## 2019-05-02 MED ORDER — EPHEDRINE SULFATE-NACL 50-0.9 MG/10ML-% IV SOSY
PREFILLED_SYRINGE | INTRAVENOUS | Status: DC | PRN
  Administered 2019-05-02 (×2): 5 mg via INTRAVENOUS

## 2019-05-02 MED ORDER — KETOROLAC TROMETHAMINE 15 MG/ML IJ SOLN
INTRAMUSCULAR | Status: AC
  Filled 2019-05-02: qty 1

## 2019-05-02 MED ORDER — DEXAMETHASONE SODIUM PHOSPHATE 10 MG/ML IJ SOLN
INTRAMUSCULAR | Status: DC | PRN
  Administered 2019-05-02: 10 mg via INTRAVENOUS

## 2019-05-02 MED ORDER — LACTATED RINGERS IV SOLN
INTRAVENOUS | Status: DC
  Administered 2019-05-02 (×2): via INTRAVENOUS

## 2019-05-02 MED ORDER — PHENYLEPHRINE 40 MCG/ML (10ML) SYRINGE FOR IV PUSH (FOR BLOOD PRESSURE SUPPORT)
PREFILLED_SYRINGE | INTRAVENOUS | Status: AC
  Filled 2019-05-02: qty 10

## 2019-05-02 MED ORDER — SCOPOLAMINE 1 MG/3DAYS TD PT72
MEDICATED_PATCH | TRANSDERMAL | Status: AC
  Filled 2019-05-02: qty 1

## 2019-05-02 MED ORDER — BUPIVACAINE LIPOSOME 1.3 % IJ SUSP
20.0000 mL | Freq: Once | INTRAMUSCULAR | Status: DC
  Filled 2019-05-02: qty 20

## 2019-05-02 MED ORDER — FENTANYL CITRATE (PF) 250 MCG/5ML IJ SOLN
INTRAMUSCULAR | Status: AC
  Filled 2019-05-02: qty 5

## 2019-05-02 MED ORDER — CEFAZOLIN SODIUM-DEXTROSE 2-4 GM/100ML-% IV SOLN
2.0000 g | INTRAVENOUS | Status: AC
  Administered 2019-05-02: 2 g via INTRAVENOUS

## 2019-05-02 MED ORDER — MIDAZOLAM HCL 2 MG/2ML IJ SOLN
INTRAMUSCULAR | Status: DC | PRN
  Administered 2019-05-02: 2 mg via INTRAVENOUS

## 2019-05-02 MED ORDER — OXYCODONE-ACETAMINOPHEN 10-325 MG PO TABS: 1.0000 | ORAL_TABLET | Freq: Four times a day (QID) | ORAL | 0 refills | Status: AC | PRN

## 2019-05-02 MED ORDER — MIDAZOLAM HCL 2 MG/2ML IJ SOLN
INTRAMUSCULAR | Status: AC
  Filled 2019-05-02: qty 2

## 2019-05-02 MED ORDER — SODIUM CHLORIDE 0.9 % IV SOLN
INTRAVENOUS | Status: DC | PRN
  Administered 2019-05-02: 25 ug/min via INTRAVENOUS

## 2019-05-02 MED ORDER — FENTANYL CITRATE (PF) 100 MCG/2ML IJ SOLN
INTRAMUSCULAR | Status: DC | PRN
  Administered 2019-05-02 (×3): 50 ug via INTRAVENOUS

## 2019-05-02 MED ORDER — METHOCARBAMOL 500 MG PO TABS
500.0000 mg | ORAL_TABLET | Freq: Four times a day (QID) | ORAL | Status: DC | PRN
  Administered 2019-05-02: 500 mg via ORAL

## 2019-05-02 MED ORDER — BUPIVACAINE-EPINEPHRINE (PF) 0.25% -1:200000 IJ SOLN
INTRAMUSCULAR | Status: AC
  Filled 2019-05-02: qty 30

## 2019-05-02 MED ORDER — KETOROLAC TROMETHAMINE 15 MG/ML IJ SOLN
15.0000 mg | Freq: Four times a day (QID) | INTRAMUSCULAR | Status: DC
  Administered 2019-05-02: 15 mg via INTRAVENOUS

## 2019-05-02 MED FILL — OXYCODONE-APAP 10-325: 10-325 | 5 days supply | Qty: 20 | Fill #0

## 2019-05-02 SURGICAL SUPPLY — 65 items
BLADE CLIPPER SURG (BLADE) IMPLANT
BLADE SURG 11 STRL SS (BLADE) ×2 IMPLANT
CLSR STERI-STRIP ANTIMIC 1/2X4 (GAUZE/BANDAGES/DRESSINGS) ×2 IMPLANT
COVER SURGICAL LIGHT HANDLE (MISCELLANEOUS) ×2 IMPLANT
DRAPE C-ARM 42X72 X-RAY (DRAPES) ×2 IMPLANT
DRAPE C-ARMOR (DRAPES) ×2 IMPLANT
DRAPE SURG 17X23 STRL (DRAPES) ×2 IMPLANT
DRAPE U-SHAPE 47X51 STRL (DRAPES) ×2 IMPLANT
DRSG OPSITE POSTOP 3X4 (GAUZE/BANDAGES/DRESSINGS) IMPLANT
DRSG OPSITE POSTOP 4X6 (GAUZE/BANDAGES/DRESSINGS) ×1 IMPLANT
DURAPREP 26ML APPLICATOR (WOUND CARE) ×2 IMPLANT
ELECT BLADE 4.0 EZ CLEAN MEGAD (MISCELLANEOUS) ×2
ELECT PENCIL ROCKER SW 15FT (MISCELLANEOUS) ×2 IMPLANT
ELECT REM PT RETURN 9FT ADLT (ELECTROSURGICAL) ×2
ELECTRODE BLDE 4.0 EZ CLN MEGD (MISCELLANEOUS) ×1 IMPLANT
ELECTRODE REM PT RTRN 9FT ADLT (ELECTROSURGICAL) ×1 IMPLANT
GLOVE BIOGEL PI IND STRL 6.5 (GLOVE) IMPLANT
GLOVE BIOGEL PI IND STRL 7.5 (GLOVE) IMPLANT
GLOVE BIOGEL PI IND STRL 8 (GLOVE) IMPLANT
GLOVE BIOGEL PI IND STRL 8.5 (GLOVE) ×1 IMPLANT
GLOVE BIOGEL PI INDICATOR 6.5 (GLOVE) ×1
GLOVE BIOGEL PI INDICATOR 7.5 (GLOVE) ×4
GLOVE BIOGEL PI INDICATOR 8 (GLOVE) ×1
GLOVE BIOGEL PI INDICATOR 8.5 (GLOVE) ×1
GLOVE SS BIOGEL STRL SZ 8.5 (GLOVE) ×1 IMPLANT
GLOVE SS N UNI LF 6.0 STRL (GLOVE) ×2 IMPLANT
GLOVE SUPERSENSE BIOGEL SZ 8.5 (GLOVE) ×1
GOWN STRL REUS W/ TWL LRG LVL3 (GOWN DISPOSABLE) ×1 IMPLANT
GOWN STRL REUS W/ TWL XL LVL3 (GOWN DISPOSABLE) IMPLANT
GOWN STRL REUS W/TWL 2XL LVL3 (GOWN DISPOSABLE) ×2 IMPLANT
GOWN STRL REUS W/TWL LRG LVL3 (GOWN DISPOSABLE) ×2
GOWN STRL REUS W/TWL XL LVL3 (GOWN DISPOSABLE) ×2
GUIDE PIN IFUSE DISP 3.2 (PIN) ×6
IMPL IFUSE 7.0X45 (Rod) IMPLANT
IMPL IFUSE 7.0X50 (Rod) IMPLANT
IMPLANT IFUSE 7.0MMX40MM (Rod) ×2 IMPLANT
IMPLANT IFUSE 7.0X45 (Rod) ×2 IMPLANT
IMPLANT IFUSE 7.0X50 (Rod) ×2 IMPLANT
KIT BASIN OR (CUSTOM PROCEDURE TRAY) ×2 IMPLANT
KIT TURNOVER KIT B (KITS) ×2 IMPLANT
NDL SPNL 18GX3.5 QUINCKE PK (NEEDLE) IMPLANT
NEEDLE 22X1 1/2 (OR ONLY) (NEEDLE) ×2 IMPLANT
NEEDLE SPNL 18GX3.5 QUINCKE PK (NEEDLE) ×2 IMPLANT
NS IRRIG 1000ML POUR BTL (IV SOLUTION) ×2 IMPLANT
PACK LAMINECTOMY ORTHO (CUSTOM PROCEDURE TRAY) ×2 IMPLANT
PACK UNIVERSAL I (CUSTOM PROCEDURE TRAY) ×2 IMPLANT
PAD ARMBOARD 7.5X6 YLW CONV (MISCELLANEOUS) ×4 IMPLANT
PIN BLUNT IFUSE DISP 3.2 (PIN) ×1 IMPLANT
PIN EXCHANGE IFUSE DISP 3.2 (PIN) ×1 IMPLANT
PIN GUIDE IFUSE DISP 3.2 (PIN) IMPLANT
POSITIONER HEAD PRONE TRACH (MISCELLANEOUS) ×2 IMPLANT
STAPLER VISISTAT 35W (STAPLE) ×2 IMPLANT
STRIP CLOSURE SKIN 1/2X4 (GAUZE/BANDAGES/DRESSINGS) ×1 IMPLANT
SUT MNCRL AB 3-0 PS2 18 (SUTURE) ×1 IMPLANT
SUT VIC AB 1 CT1 18XCR BRD 8 (SUTURE) ×1 IMPLANT
SUT VIC AB 1 CT1 8-18 (SUTURE) ×2
SUT VIC AB 2-0 CT1 18 (SUTURE) ×2 IMPLANT
SYR BULB IRRIGATION 50ML (SYRINGE) ×2 IMPLANT
SYR CONTROL 10ML LL (SYRINGE) ×2 IMPLANT
SYS SPNL FX3ANG 40X7X (Rod) ×1 IMPLANT
SYSTEM SPNL FX3ANG 40X7X (Rod) IMPLANT
TOWEL GREEN STERILE (TOWEL DISPOSABLE) ×2 IMPLANT
TOWEL GREEN STERILE FF (TOWEL DISPOSABLE) ×2 IMPLANT
WATER STERILE IRR 1000ML POUR (IV SOLUTION) ×2 IMPLANT
YANKAUER SUCT BULB TIP NO VENT (SUCTIONS) ×2 IMPLANT

## 2019-05-02 NOTE — Transfer of Care (Signed)
Immediate Anesthesia Transfer of Care Note  Patient: Stacey Lawrence  Procedure(s) Performed: SACROILIAC JOINT FUSION (Right Hip)  Patient Location: PACU  Anesthesia Type:General  Level of Consciousness: awake, alert , oriented and patient cooperative  Airway & Oxygen Therapy: Patient Spontanous Breathing and Patient connected to nasal cannula oxygen  Post-op Assessment: Report given to RN, Post -op Vital signs reviewed and stable and Patient moving all extremities X 4  Post vital signs: Reviewed and stable  Last Vitals:  Vitals Value Taken Time  BP 124/67 05/02/19 1304  Temp    Pulse 93 05/02/19 1307  Resp 12 05/02/19 1307  SpO2 98 % 05/02/19 1307  Vitals shown include unvalidated device data.  Last Pain:  Vitals:   05/02/19 0912  PainSc: 7       Patients Stated Pain Goal: 3 (11/64/35 3912)  Complications: No apparent anesthesia complications

## 2019-05-02 NOTE — Discharge Instructions (Signed)
°SI Fusion, Care After °This sheet gives you information about how to care for yourself after your procedure. Your health care provider may also give you more specific instructions. If you have problems or questions, contact your health care provider. °What can I expect after the procedure? °After the procedure, it is common to have: °· Some pain around your incision area. °· Muscle tightening (spasms) across the back. °  °Follow these instructions at home: °Incision care °· Follow instructions from your health care provider about how to take care of your incision area. Make sure you: °? Wash your hands with soap and water before and after you apply medicine to the area or change your bandage (dressing). If soap and water are not available, use hand sanitizer. °? Change your dressing as told by your health care provider. °? Leave stitches (sutures), skin glue, or adhesive strips in place. These skin closures may need to stay in place for 2 weeks or longer. If adhesive strip edges start to loosen and curl up, you may trim the loose edges. Do not remove adhesive strips completely unless your health care provider tells you to do that. °·  °· Check your incision area every day for signs of infection. Check for: °? More redness, swelling, or pain. °? More fluid or blood. °? Warmth. °? Pus or a bad smell. °Medicines °· Take over-the-counter and prescription medicines only as told by your health care provider. °· If you were prescribed an antibiotic medicine, use it as told by your health care provider. Do not stop using the antibiotic even if you start to feel better. °· Pain medications have been electronically prescribed. °Bathing °· Do not take baths, swim, or use a hot tub for 6 weeks, or until your incision has healed completely. °· If your health care provider approves, you may take showers after your dressing has been removed. °· Ok to shower in five (5) days °Activity °· Return to your normal activities as told  by your health care provider. Ask your health care provider what activities are safe for you. °· Avoid bending or twisting at your waist. Always bend at your knees. °· Do not sit for more than 20-30 minutes at a time. Lie down or walk between periods of sitting. °· Do not lift anything that is heavier than 10 lb (4.5 kg) or the limit that your health care provider tells you, until he or she says that it is safe. °· Do not drive for 2 weeks after your procedure or for as long as your health care provider tells you. °·  °· Do not drive or use heavy machinery while taking prescription pain medicine. °· DO NOT BEAR WEIGHT ON THE LEFT LEG °General instructions °· To prevent or treat constipation while you are taking prescription pain medicine, your health care provider may recommend that you: °? Drink enough fluid to keep your urine clear or pale yellow. °? Take over-the-counter or prescription medicines. °? Eat foods that are high in fiber, such as fresh fruits and vegetables, whole grains, and beans. °? Limit foods that are high in fat and processed sugars, such as fried and sweet foods. °· Do breathing exercises as told. °· Keep all follow-up visits as told by your health care provider. This is important. °Contact a health care provider if: °· You have more redness, swelling, or pain around your incision area. °· Your incision feels warm to the touch. °· You are not able to return to activities or   do exercises as told by your health care provider. °Get help right away if: °· You have: °? More fluid or blood coming from your incision area. °? Pus or a bad smell coming from your incision area. °? Chills or a fever. °? Episodes of dizziness or fainting while standing. °· You develop a rash. °· You develop shortness of breath or you have difficulty breathing. °· You cannot control when you urinate or have a bowel movement. °· You become weak. °· You are not able to use your legs. °Summary °· After the procedure, it is  common to have some pain around your incision area. You may also have muscle tightening (spasms) across the back. °· Follow instructions from your health care provider about how to care for your incision. °· Do not lift anything that is heavier than 10 lb (4.5 kg) or the limit that your health care provider tells you, until he or she says that it is safe. °· Contact your health care provider if you have more redness, swelling, or pain around your incision area or if your incision feels warm to the touch. These can be signs of infection. °This information is not intended to replace advice given to you by your health care provider. Make sure you discuss any questions you have with your health care provider. ° ° ° ° °Crutch Mobility: Non-Weight Bearing ° ° ° °Keep weight off right leg. Grasp crutch handles securely. Do NOT lean armpits on crutches. °1.Move both crutches forward and slightly out to sides. °2.Hop up to crutches with left leg. °Repeat above sequence for each step taken. °

## 2019-05-02 NOTE — Anesthesia Procedure Notes (Signed)
Procedure Name: Intubation Date/Time: 05/02/2019 11:33 AM Performed by: Colin Benton, CRNA Pre-anesthesia Checklist: Patient identified, Emergency Drugs available, Suction available and Patient being monitored Patient Re-evaluated:Patient Re-evaluated prior to induction Oxygen Delivery Method: Circle System Utilized Preoxygenation: Pre-oxygenation with 100% oxygen Induction Type: IV induction and Rapid sequence Laryngoscope Size: Mac and 3 Grade View: Grade I Tube type: Oral Tube size: 7.0 mm Number of attempts: 1 Airway Equipment and Method: Stylet Placement Confirmation: ETT inserted through vocal cords under direct vision,  positive ETCO2 and breath sounds checked- equal and bilateral Secured at: 19 cm Tube secured with: Tape Dental Injury: Teeth and Oropharynx as per pre-operative assessment

## 2019-05-02 NOTE — H&P (Signed)
History of Present Illness Stacey Lawrence is a pleasant 68 year old female with past medical history significant for angina, who has been dealing with right SI joint pain for some time now (since 11/2017). She reports Progressive severe, debilitating right-sided low back/buttock pain that occasionally radiates into her posterior lateral thigh. Her pain is worse when she is sitting for long period of time when she goes from sitting to standing. She has undergone home exercise program in addition to formalized aquatic physical therapy, over-the-counter medications, and several SI joint injections. In the past, SI joint injections have been significantly helpful in eliminating greater than 80% of the patient's pain And we had identified the patient's primary source of her pain is the right SI joint. Rather than initially moving forward with surgery, the patient decided to manage her pain conservatively as her husband is having rotator cuff surgery; however at this point she with a to move forward with surgical intervention. discussion of Right SI JT Fusion  Allergies CODEINE  DILAUDID  MORPHINE   Medications atorvastatin 40 mg tablet benzonatate 200 mg capsule Biofreeze (menthol) 4 % topical gel carisoprodoL 350 mg tablet celecoxib 200 mg capsule Dexilant 60 mg capsule, delayed release dilTIAZem CD 120 mg capsule,extended release 24 hr DULoxetine 30 mg capsule,delayed release DULoxetine 60 mg capsule,delayed release isosorbide dinitrate 10 mg tablet mirtazapine 30 mg tablet nitroglycerin 0.4 mg sublingual tablet promethazine 25 mg tablet Restasis 0.05 % eye drops in a dropperette traZODone 50 mg tablet Ventolin HFA 90 mcg/actuation aerosol inhaler Vitamin D2 1,250 mcg (50,000 unit) capsule          Problems Myocardial infarction - Onset: 11/27/2018 Degeneration of lumbar intervertebral disc - Onset: 12/07/2017 Sacroiliac joint pain - Onset: 01/13/2018 Calcaneal spur - Onset: 04/09/2019,  Right Trigger finger of left hand - Onset: 10/02/2017 Trigger finger of right hand - Onset: 10/02/2017 Laceration of right thumb - Onset: 07/11/2018 Pain in right thumb - Onset: 07/11/2018 Pain in right hand - Onset: 10/01/2018 Acquired trigger finger of right middle finger - Onset: 10/01/2018 Somatic dysfunction of right sacroiliac joint - Onset: 10/25/2018 Pain in right heel - Onset: 04/09/2019  Family History Mother - Diabetes mellitus   - Chronic obstructive lung disease   - Rheumatoid arthritis   - Family history of cancer   - Congestive heart failure   - Osteoarthritis   - Diabetes mellitus   - Arthritis Father - Family history of cancer Maternal Grandmother - Congestive heart failure  Social History Tobacco Smoking Status: Never smoker  Surgical History Hysterectomy Tonsillectomy Dilation and curettage Colonic polypectomy Cholecystectomy Tubal ligation Appendectomy GYN History Most Recent Bone Density: 11/24/2017. Most Recent Mammogram: 08/05/2017.  Past Medical History Anxiety Disorder: Y Chest Pain/Angina: Y Congestive Heart Failure: Y Depression: Y Dizziness: Y Excessive Thirst: Y Eye Disease/Cataracts/Glaucoma: Y Fibromyalgia: Y GERD/Reflux: Y GI Issues Specify:: Y - IBS Headaches: Y Heart Problems: Y Irregular Heartbeat: Y Joint Pain: Y Migraines: Y Numbness/Tingling: Y Previous Cortisone Injection(s): Y Swelling of Legs/Feet/Hands: Y  Physical Exam General: AAOX3, well developed and well nourished, NAD Ambulation: antalgic gait pattern, uses walking assistive device right heel spur. Cardiovascular: RRR, no rubs, murmers, or gallops noted Lungs: CTAB Abdomen:BSx4, NOn-tender, non-distended, no hepatosplenomegaly.  Inspection: No obvious deformity, scoliosis, kyphosis, loss of lordotic curve. Palpation: Non-tender over spinous processes and paraspinal muscles. Significantly tender to palpation over the right SI joint.  AROM: -Hip IR/ER:  LeftPain free, right Pain free - Knee: flexion and extension normal and pain free bilaterally. - Ankle: Dorsiflexion, plantarflexion,  inversion, eversion normal and pain free.   Dermatomes: Lower extremity sensation to light touch intact bilaterally  Myotomes:  - Hip Flexion: Left 5/5, Right 4/5 limited due to severe left SI joint pain.  - Knee Extension: Left 5/5, Right 5/5 - Knee Flexion: Left 5/5, Right 5/5 - Ankle Dorsiflextion: Left 5/5, Right 5/5 - Ankle Plantarflexion: Left 5/5, Right 5/5  Reflexes:  - Clonus: Negative  Special Tests: - FABER: Left: Negative, Right Positive - Gleasons test: Positive  PV: Extremities warm and well profused. Posterior and dorsalis pedis pulse 2+ bilaterally, No pitting Edema, discoloration, calf tenderness, or palpable cords. Homan's negative bilaterally.   Assessment & Plan Assessment: Stacey Lawrence is a pleasant 68 year old female with past medical history significant for Angina he has been dealing with right SI joint pain for some time now (since 11/2017). She reports Progressive severe, debilitating right-sided low back/buttock pain that occasionally radiates into her posterior lateral thigh. Her pain is worse when she is sitting for long period of time when she goes from sitting to standing. She has undergone home exercise program in addition to formalized aquatic physical therapy, over-the-counter medications, and several SI joint injections. In the past, SI joint injections have been significantly helpful in eliminating greater than 80% of the patient's pain And we had identified the patient's primary source of her pain is the right SI joint. At this point, the patient has undergone numerous conservative measures despite which she has had progressive debilitating pain. At this point, I do think it is reasonable to move forward with her right SI joint fusion.  Plan: We will move forward with a right SI joint fusion  Risks and benefits of surgery were  discussed with the patient. These include: Infection, bleeding, death, stroke, paralysis, ongoing or worse pain, need for additional surgery, nonunion, mal-position of the screws.  We have also discussed the goals of surgery to include: Goals of surgery: Reduction in pain, and improvement in quality of life.  We have also discussed the post-operative recovery period to include: bathing/showering restrictions, wound healing, activity (and driving) restrictions, medications/pain mangement. **OF NOTE, THE PATIENT STATES THAT SHE HAS REQUIRED PHENERGAN IN THE PAST FOR POST-OP N/V RATHER THAN ZOFRAN**  We have also discussed post-operative redflags to include: signs and symptoms of postoperative infection, DVT/PE.  All of patient's questions were invited and answered . I have provided the patient with surgical clearance forms for her primary care physician and her cardiologist.

## 2019-05-02 NOTE — Progress Notes (Signed)
Orthopedic Tech Progress Note Patient Details:  Stacey Lawrence 1951/02/24 [id number] Worked with therapy on crutches. Ortho Devices Type of Ortho Device: Crutches Ortho Device/Splint Interventions: Ordered       Braulio Bosch 05/02/2019, 2:20 PM

## 2019-05-02 NOTE — Op Note (Signed)
Operative report  Preoperative diagnosis: Right sacroiliac dysfunction  Postoperative diagnosis: Same  Operative procedure: Right sacral joint fusion  First Assistant: Cleta Alberts, PA  Implant: I fuse 3D implant system.  Top: 7.0 x 50 mm.  Middle: 7.0 x 45 mm.  Bottom: 7.0 x 40 mm.  Complications: None  Indications: Stacey Lawrence is a very pleasant 68 year old active woman who is been having progressive debilitating right SI joint pain.  Attempts at conservative management consisting of physical therapy, activity modification, and medications had failed to alleviate her symptoms.  Clinical exam was confirmed with diagnostic Marcaine injections.  Patient had significant temporary relief with SI joint Marcaine blocks.  Once we confirmed the pain source we discussed surgery since she was not moving forward with conservative care.  All appropriate risks benefits and alternatives were discussed with the patient and consent was obtained.  Operative report: Patient is brought the operating room placed upon the operating room table.  After successful induction of general anesthesia and a trach the patient teds SCDs were applied she was turned prone onto the chest and pelvic roll.  All bony prominences well-padded and the right gluteal region was prepped and draped in a standard fashion.  Timeout was taken to confirm patient procedure and all other important data.  Using lateral fluoroscopy identified the SI joint and marked out the anatomical borders on the skin.  I marked my incision site and infiltrated with quarter percent Marcaine with epinephrine.  Small stab incision was made and the first pin was placed percutaneously on the lateral aspect of the pelvis.  I confirmed satisfactory trajectory across the SI joint and into the sacrum.  Using lateral fluoroscopy I began to insert into the pelvis I then used the inlet view to confirm that I was heading into the central portion of the sacrum.  I then used the  outlet view to ensure that I remained lateral to the neuroforamen.  Once the first pin was properly positioned I used the same technique to place the second and third pins.  All 3 pins were satisfactory placed in inlet, outlet, and lateral fluoroscopy views.  Once all 3 pins were across the SI joint and safely docked into the sacrum I used an 11 blade scalpel to make a single incision connecting all 3 of the percutaneously placed pins.  I then measured the first top pin and then broached across the pelvis and just into the near side of the sacrum.  I then removed the broach and inserted the I fuse implant.  I had excellent position and trajectory using the outlet and lateral views.  The implant had a secure fit.  Using a 6 same exact technique of broaching and then placed in the implant I placed the middle and lower implants.  Once all 3 implants were properly positioned I confirmed satisfactory position across the SI joint and into the sacrum using the inlet, outlet, and lateral view.  At this point I was pleased with the fixation.  The wound was then copiously irrigated with normal saline and hemostasis was obtained using Bovie.  I then closed in a layered fashion with interrupted #1 Vicryl suture, 2-0 Vicryl suture, and 3-0 Monocryl.  I did then inject combination of quarter percent Marcaine with epinephrine and Exparel for postoperative analgesia.  This is injected all the way down to the periosteum of the pelvis and into the soft tissues.  The patient was ultimately extubated transfer the PACU without incident.  The end of the  case all needle sponge counts were correct.  There were no adverse intraoperative events.

## 2019-05-02 NOTE — Brief Op Note (Signed)
05/02/2019  12:43 PM  PATIENT:  Stacey Lawrence  68 y.o. female  PRE-OPERATIVE DIAGNOSIS:  Right sacroiliac joint dysfunction  POST-OPERATIVE DIAGNOSIS:  Right sacroiliac joint dysfunction  PROCEDURE:  Procedure(s) with comments: SACROILIAC JOINT FUSION (Right) - SACROILIAC JOINT FUSION  SURGEON:  Surgeon(s) and Role:    Melina Schools, MD - Primary  PHYSICIAN ASSISTANT:   ASSISTANTS: amanda ward, pa   ANESTHESIA:   general  EBL:  50 mL   BLOOD ADMINISTERED:none  DRAINS: none   LOCAL MEDICATIONS USED:  MARCAINE    and OTHER exparel  SPECIMEN:  No Specimen  DISPOSITION OF SPECIMEN:  N/A  COUNTS:  YES  TOURNIQUET:  * No tourniquets in log *  DICTATION: .Dragon Dictation  PLAN OF CARE: Admit for overnight observation  PATIENT DISPOSITION:  PACU - hemodynamically stable.

## 2019-05-02 NOTE — Anesthesia Postprocedure Evaluation (Signed)
Anesthesia Post Note  Patient: Stacey Lawrence  Procedure(s) Performed: SACROILIAC JOINT FUSION (Right Hip)     Anesthesia Post Evaluation  Last Vitals:  Vitals:   05/02/19 1405 05/02/19 1415  BP: (!) 109/56 (!) 109/56  Pulse: 78 72  Resp: 14   Temp:    SpO2: 95%     Last Pain:  Vitals:   05/02/19 1400  PainSc: 3                  Sheretta Grumbine

## 2019-05-03 ENCOUNTER — Encounter (HOSPITAL_COMMUNITY): Payer: Self-pay | Admitting: Orthopedic Surgery

## 2019-05-08 ENCOUNTER — Encounter (HOSPITAL_COMMUNITY): Payer: Self-pay | Admitting: Orthopedic Surgery

## 2019-05-12 ENCOUNTER — Encounter (HOSPITAL_COMMUNITY): Payer: Self-pay | Admitting: Orthopedic Surgery

## 2019-05-12 NOTE — OR Nursing (Signed)
LATE ENTRY: Entered missing supplies.  Verified with Corky Downs, RN circulator signed request to purchase.

## 2019-05-13 MED FILL — CARTIA XT 120 MG CP24: 120 | 30 days supply | Qty: 30 | Fill #4

## 2019-05-16 ENCOUNTER — Encounter (HOSPITAL_COMMUNITY): Payer: Self-pay | Admitting: Orthopedic Surgery

## 2019-05-17 ENCOUNTER — Encounter (HOSPITAL_COMMUNITY): Payer: Self-pay | Admitting: Orthopedic Surgery

## 2019-06-05 ENCOUNTER — Other Ambulatory Visit: Payer: Self-pay

## 2019-06-05 ENCOUNTER — Ambulatory Visit (INDEPENDENT_AMBULATORY_CARE_PROVIDER_SITE_OTHER): Payer: Medicare Other | Admitting: Psychiatry

## 2019-06-05 DIAGNOSIS — F330 Major depressive disorder, recurrent, mild: Secondary | ICD-10-CM
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
## 2019-06-13 MED FILL — GABAPENTIN 100 MG CAPSULE: 100 | 90 days supply | Qty: 270 | Fill #1

## 2019-06-13 MED FILL — CARTIA XT 120 MG CP24: 120 | 30 days supply | Qty: 30 | Fill #5

## 2019-06-14 ENCOUNTER — Other Ambulatory Visit (HOSPITAL_COMMUNITY): Payer: Self-pay | Admitting: Orthopedic Surgery

## 2019-06-14 ENCOUNTER — Other Ambulatory Visit: Payer: Self-pay

## 2019-06-14 ENCOUNTER — Ambulatory Visit (HOSPITAL_COMMUNITY)
Admission: RE | Admit: 2019-06-14 | Discharge: 2019-06-14 | Disposition: A | Discharge status: Home / Self Care | Payer: Medicare Other | Source: Ambulatory Visit | Attending: Orthopedic Surgery | Admitting: Orthopedic Surgery

## 2019-06-14 DIAGNOSIS — M79605 Pain in left leg: Secondary | ICD-10-CM | POA: Diagnosis not present

## 2019-06-14 DIAGNOSIS — M79604 Pain in right leg: Secondary | ICD-10-CM

## 2019-06-14 DIAGNOSIS — M79662 Pain in left lower leg: Secondary | ICD-10-CM | POA: Diagnosis not present

## 2019-06-14 DIAGNOSIS — Z4889 Encounter for other specified surgical aftercare: Secondary | ICD-10-CM | POA: Diagnosis not present

## 2019-06-14 DIAGNOSIS — M79661 Pain in right lower leg: Secondary | ICD-10-CM | POA: Diagnosis not present

## 2019-06-19 DIAGNOSIS — M6281 Muscle weakness (generalized): Secondary | ICD-10-CM | POA: Diagnosis not present

## 2019-06-19 DIAGNOSIS — M545 Low back pain: Secondary | ICD-10-CM | POA: Diagnosis not present

## 2019-06-20 ENCOUNTER — Ambulatory Visit (INDEPENDENT_AMBULATORY_CARE_PROVIDER_SITE_OTHER): Payer: Medicare Other | Admitting: Psychiatry

## 2019-06-20 ENCOUNTER — Other Ambulatory Visit: Payer: Self-pay

## 2019-06-20 DIAGNOSIS — F330 Major depressive disorder, recurrent, mild: Secondary | ICD-10-CM
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
## 2019-06-26 ENCOUNTER — Telehealth: Payer: Self-pay | Admitting: Cardiovascular Disease

## 2019-06-26 MED ORDER — ISOSORBIDE DINITRATE 10 MG PO TABS: 10.0000 mg | ORAL_TABLET | Freq: Two times a day (BID) | ORAL | 11 refills | Status: DC

## 2019-06-26 NOTE — Telephone Encounter (Signed)
BP is 94/60 HR 72 Please advise

## 2019-06-26 NOTE — Telephone Encounter (Signed)
BP soft. Would recommend increased fluid intake. Also would consider isosorbide dinitrate 10 mg bid.

## 2019-06-26 NOTE — Telephone Encounter (Signed)
Pt notified and voiced understanding 

## 2019-06-26 NOTE — Telephone Encounter (Signed)
Per pt phone call-- states her Angina has gotten bad again and would like to know what she should do.

## 2019-06-26 NOTE — Telephone Encounter (Signed)
Spoke with pt who states that her angina is getting worse. She reports that it started 4-6 weeks ago and last from 1 to 30 mins at a time pt reports it is worse when sitting up and lying down. She does state she is under more stress at this time. Pt c/o having chest pressure, nausea and sweating at times. She states that it feels like it did when she was in the hospital in May. Pt inform to be seen in ED and she stated that she preferred to have medication adjustment done.

## 2019-06-26 NOTE — Telephone Encounter (Signed)
She does not have obstructive CAD by coronary CT angiography. Only has a myocardial bridge. What is HR and BP? If vitals tolerate, would increase diltiazem to 180 mg daily to see if this helps to improve symptoms. Symptoms may be related to stress.

## 2019-06-27 DIAGNOSIS — M545 Low back pain: Secondary | ICD-10-CM | POA: Diagnosis not present

## 2019-06-27 DIAGNOSIS — M6281 Muscle weakness (generalized): Secondary | ICD-10-CM | POA: Diagnosis not present

## 2019-07-01 DIAGNOSIS — M545 Low back pain: Secondary | ICD-10-CM | POA: Diagnosis not present

## 2019-07-01 DIAGNOSIS — M6281 Muscle weakness (generalized): Secondary | ICD-10-CM | POA: Diagnosis not present

## 2019-07-03 DIAGNOSIS — M6701 Short Achilles tendon (acquired), right ankle: Secondary | ICD-10-CM | POA: Diagnosis not present

## 2019-07-03 DIAGNOSIS — M7661 Achilles tendinitis, right leg: Secondary | ICD-10-CM | POA: Diagnosis not present

## 2019-07-05 DIAGNOSIS — M7661 Achilles tendinitis, right leg: Secondary | ICD-10-CM | POA: Diagnosis not present

## 2019-07-05 DIAGNOSIS — M545 Low back pain: Secondary | ICD-10-CM | POA: Diagnosis not present

## 2019-07-05 DIAGNOSIS — M6281 Muscle weakness (generalized): Secondary | ICD-10-CM | POA: Diagnosis not present

## 2019-07-05 MED FILL — ISOSORBIDE DN 10 MG TABLET: 10 | 30 days supply | Qty: 60 | Fill #0
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
## 2019-07-09 DIAGNOSIS — M79671 Pain in right foot: Secondary | ICD-10-CM | POA: Diagnosis not present

## 2019-07-11 ENCOUNTER — Other Ambulatory Visit: Payer: Self-pay

## 2019-07-11 ENCOUNTER — Ambulatory Visit (INDEPENDENT_AMBULATORY_CARE_PROVIDER_SITE_OTHER): Payer: Medicare Other | Admitting: Psychiatry

## 2019-07-11 DIAGNOSIS — F330 Major depressive disorder, recurrent, mild: Secondary | ICD-10-CM | POA: Diagnosis not present
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
## 2019-07-15 ENCOUNTER — Ambulatory Visit (INDEPENDENT_AMBULATORY_CARE_PROVIDER_SITE_OTHER): Payer: Medicare Other | Admitting: Psychiatry

## 2019-07-15 ENCOUNTER — Other Ambulatory Visit: Payer: Self-pay

## 2019-07-15 ENCOUNTER — Encounter (HOSPITAL_COMMUNITY): Payer: Self-pay | Admitting: Psychiatry

## 2019-07-15 DIAGNOSIS — F3341 Major depressive disorder, recurrent, in partial remission: Secondary | ICD-10-CM

## 2019-07-15 MED ORDER — TRAZODONE HCL 50 MG PO TABS: 50.0000 mg | ORAL_TABLET | Freq: Every evening | ORAL | 1 refills | Status: DC | PRN

## 2019-07-15 MED ORDER — DULOXETINE HCL 60 MG PO CPEP: 60.0000 mg | ORAL_CAPSULE | Freq: Every day | ORAL | 1 refills | Status: DC

## 2019-07-15 NOTE — Patient Instructions (Signed)
1.Continue duloxetine 60 mg daily  2.Continue Trazodone 50 mg at night as needed for sleep 3.Next appointment: in Feb

## 2019-07-16 ENCOUNTER — Telehealth: Payer: Self-pay | Admitting: Cardiovascular Disease

## 2019-07-16 DIAGNOSIS — M79671 Pain in right foot: Secondary | ICD-10-CM | POA: Diagnosis not present

## 2019-07-16 DIAGNOSIS — M533 Sacrococcygeal disorders, not elsewhere classified: Secondary | ICD-10-CM | POA: Diagnosis not present

## 2019-07-16 NOTE — Telephone Encounter (Signed)
Patient cannot tolerate new medication changes.  Stated that she gets horrible migraines and sick on her stomach

## 2019-07-16 NOTE — Telephone Encounter (Signed)
Left message for patient to call me back-cc

## 2019-07-16 NOTE — Telephone Encounter (Signed)
Patient cannot tolerate isordil

## 2019-07-16 NOTE — Telephone Encounter (Signed)
DC Isordil

## 2019-07-17 DIAGNOSIS — Z6823 Body mass index (BMI) 23.0-23.9, adult: Secondary | ICD-10-CM | POA: Diagnosis not present

## 2019-07-17 DIAGNOSIS — R42 Dizziness and giddiness: Secondary | ICD-10-CM | POA: Diagnosis not present

## 2019-07-17 DIAGNOSIS — F438 Other reactions to severe stress: Secondary | ICD-10-CM | POA: Diagnosis not present

## 2019-07-17 DIAGNOSIS — R7301 Impaired fasting glucose: Secondary | ICD-10-CM | POA: Diagnosis not present

## 2019-07-17 DIAGNOSIS — K581 Irritable bowel syndrome with constipation: Secondary | ICD-10-CM | POA: Diagnosis not present

## 2019-07-17 DIAGNOSIS — J060 Acute laryngopharyngitis: Secondary | ICD-10-CM | POA: Diagnosis not present

## 2019-07-17 DIAGNOSIS — E559 Vitamin D deficiency, unspecified: Secondary | ICD-10-CM | POA: Diagnosis not present

## 2019-07-17 DIAGNOSIS — Z6824 Body mass index (BMI) 24.0-24.9, adult: Secondary | ICD-10-CM | POA: Diagnosis not present

## 2019-07-17 DIAGNOSIS — M461 Sacroiliitis, not elsewhere classified: Secondary | ICD-10-CM | POA: Diagnosis not present

## 2019-07-17 DIAGNOSIS — R05 Cough: Secondary | ICD-10-CM | POA: Diagnosis not present

## 2019-07-17 DIAGNOSIS — D751 Secondary polycythemia: Secondary | ICD-10-CM | POA: Diagnosis not present

## 2019-07-17 DIAGNOSIS — M5136 Other intervertebral disc degeneration, lumbar region: Secondary | ICD-10-CM | POA: Diagnosis not present

## 2019-07-17 DIAGNOSIS — F39 Unspecified mood [affective] disorder: Secondary | ICD-10-CM | POA: Diagnosis not present

## 2019-07-17 DIAGNOSIS — M25559 Pain in unspecified hip: Secondary | ICD-10-CM | POA: Diagnosis not present

## 2019-07-17 NOTE — Telephone Encounter (Signed)
Pt will stop isordil

## 2019-07-19 ENCOUNTER — Other Ambulatory Visit: Payer: Self-pay

## 2019-07-19 DIAGNOSIS — N390 Urinary tract infection, site not specified: Secondary | ICD-10-CM | POA: Diagnosis not present

## 2019-07-19 DIAGNOSIS — M6281 Muscle weakness (generalized): Secondary | ICD-10-CM | POA: Diagnosis not present

## 2019-07-19 DIAGNOSIS — M545 Low back pain: Secondary | ICD-10-CM | POA: Diagnosis not present

## 2019-07-19 MED ORDER — DILTIAZEM HCL ER COATED BEADS 120 MG PO CP24: 120.0000 mg | ORAL_CAPSULE | Freq: Every day | ORAL | 3 refills | Status: DC

## 2019-07-19 MED FILL — CARTIA XT 120 MG CP24: 120 | 90 days supply | Qty: 90 | Fill #0

## 2019-07-19 NOTE — Telephone Encounter (Signed)
Refilled cartia xt to [facility]

## 2019-07-23 DIAGNOSIS — M6281 Muscle weakness (generalized): Secondary | ICD-10-CM | POA: Diagnosis not present

## 2019-07-23 DIAGNOSIS — M545 Low back pain: Secondary | ICD-10-CM | POA: Diagnosis not present

## 2019-07-23 MED FILL — CIPROFLOXACIN HCL 250 MG TA: 250 | 7 days supply | Qty: 14 | Fill #0

## 2019-07-25 ENCOUNTER — Ambulatory Visit (INDEPENDENT_AMBULATORY_CARE_PROVIDER_SITE_OTHER): Payer: Medicare Other | Admitting: Otolaryngology

## 2019-07-25 DIAGNOSIS — H903 Sensorineural hearing loss, bilateral: Secondary | ICD-10-CM

## 2019-07-26 DIAGNOSIS — M79671 Pain in right foot: Secondary | ICD-10-CM | POA: Diagnosis not present

## 2019-07-26 DIAGNOSIS — M533 Sacrococcygeal disorders, not elsewhere classified: Secondary | ICD-10-CM | POA: Diagnosis not present

## 2019-07-29 DIAGNOSIS — Z4889 Encounter for other specified surgical aftercare: Secondary | ICD-10-CM | POA: Diagnosis not present

## 2019-07-30 DIAGNOSIS — M6281 Muscle weakness (generalized): Secondary | ICD-10-CM | POA: Diagnosis not present

## 2019-07-30 DIAGNOSIS — M7661 Achilles tendinitis, right leg: Secondary | ICD-10-CM | POA: Diagnosis not present

## 2019-07-30 DIAGNOSIS — M545 Low back pain: Secondary | ICD-10-CM | POA: Diagnosis not present

## 2019-08-02 DIAGNOSIS — Z23 Encounter for immunization: Secondary | ICD-10-CM | POA: Diagnosis not present

## 2019-08-02 MED FILL — CELECOXIB 200 MG CAP: 200 | 30 days supply | Qty: 30 | Fill #0

## 2019-08-02 MED FILL — traZODone HCL 50 MG TABS: 50 | 90 days supply | Qty: 90 | Fill #0

## 2019-08-06 DIAGNOSIS — M79671 Pain in right foot: Secondary | ICD-10-CM | POA: Diagnosis not present

## 2019-08-06 DIAGNOSIS — M533 Sacrococcygeal disorders, not elsewhere classified: Secondary | ICD-10-CM | POA: Diagnosis not present

## 2019-08-08 DIAGNOSIS — M79671 Pain in right foot: Secondary | ICD-10-CM | POA: Diagnosis not present

## 2019-08-12 DIAGNOSIS — M7661 Achilles tendinitis, right leg: Secondary | ICD-10-CM | POA: Diagnosis not present

## 2019-08-23 MED FILL — MELOXICAM 15 MG TABLET: 15 | 30 days supply | Qty: 30 | Fill #0

## 2019-09-26 DIAGNOSIS — M25559 Pain in unspecified hip: Secondary | ICD-10-CM | POA: Diagnosis not present

## 2019-09-26 DIAGNOSIS — R05 Cough: Secondary | ICD-10-CM | POA: Diagnosis not present

## 2019-09-26 DIAGNOSIS — Z6824 Body mass index (BMI) 24.0-24.9, adult: Secondary | ICD-10-CM | POA: Diagnosis not present

## 2019-09-26 DIAGNOSIS — M461 Sacroiliitis, not elsewhere classified: Secondary | ICD-10-CM | POA: Diagnosis not present

## 2019-09-26 DIAGNOSIS — R7301 Impaired fasting glucose: Secondary | ICD-10-CM | POA: Diagnosis not present

## 2019-09-26 DIAGNOSIS — N390 Urinary tract infection, site not specified: Secondary | ICD-10-CM | POA: Diagnosis not present

## 2019-09-26 DIAGNOSIS — F39 Unspecified mood [affective] disorder: Secondary | ICD-10-CM | POA: Diagnosis not present

## 2019-09-26 DIAGNOSIS — M5136 Other intervertebral disc degeneration, lumbar region: Secondary | ICD-10-CM | POA: Diagnosis not present

## 2019-09-26 DIAGNOSIS — D751 Secondary polycythemia: Secondary | ICD-10-CM | POA: Diagnosis not present

## 2019-09-26 DIAGNOSIS — K581 Irritable bowel syndrome with constipation: Secondary | ICD-10-CM | POA: Diagnosis not present

## 2019-09-26 DIAGNOSIS — R42 Dizziness and giddiness: Secondary | ICD-10-CM | POA: Diagnosis not present

## 2019-09-26 DIAGNOSIS — J060 Acute laryngopharyngitis: Secondary | ICD-10-CM | POA: Diagnosis not present

## 2019-10-16 ENCOUNTER — Ambulatory Visit: Payer: Medicare Other | Admitting: Urology

## 2019-10-17 ENCOUNTER — Telehealth (HOSPITAL_COMMUNITY): Payer: Self-pay | Admitting: Psychiatry

## 2019-10-17 NOTE — Telephone Encounter (Signed)
Pt calling in requesting your assistance in helping her find placement for her family member [phone].

## 2019-10-30 ENCOUNTER — Ambulatory Visit: Payer: Medicare Other | Admitting: Urology

## 2019-11-06 DIAGNOSIS — R102 Pelvic and perineal pain: Secondary | ICD-10-CM | POA: Diagnosis not present

## 2019-11-06 DIAGNOSIS — R350 Frequency of micturition: Secondary | ICD-10-CM | POA: Diagnosis not present

## 2019-11-06 DIAGNOSIS — R3915 Urgency of urination: Secondary | ICD-10-CM | POA: Diagnosis not present

## 2019-11-06 DIAGNOSIS — N3946 Mixed incontinence: Secondary | ICD-10-CM | POA: Diagnosis not present
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
## 2019-11-13 ENCOUNTER — Encounter (HOSPITAL_COMMUNITY): Payer: Self-pay | Admitting: Psychiatry

## 2019-11-13 ENCOUNTER — Other Ambulatory Visit: Payer: Self-pay

## 2019-11-13 ENCOUNTER — Ambulatory Visit (INDEPENDENT_AMBULATORY_CARE_PROVIDER_SITE_OTHER): Payer: Medicare Other | Admitting: Psychiatry

## 2019-11-13 DIAGNOSIS — F330 Major depressive disorder, recurrent, mild: Secondary | ICD-10-CM | POA: Diagnosis not present

## 2019-11-13 MED ORDER — DULOXETINE HCL 60 MG PO CPEP: 60.0000 mg | ORAL_CAPSULE | Freq: Every day | ORAL | 0 refills | Status: DC

## 2019-11-13 MED FILL — DULoxetine HCL 60 MG CPEP: 60 | 90 days supply | Qty: 90 | Fill #0

## 2019-11-15 ENCOUNTER — Ambulatory Visit (HOSPITAL_COMMUNITY): Payer: Medicare Other | Admitting: Psychiatry

## 2019-11-18 MED FILL — CARTIA XT 120 MG CP24: 120 | 90 days supply | Qty: 90 | Fill #1

## 2019-11-19 DIAGNOSIS — M7061 Trochanteric bursitis, right hip: Secondary | ICD-10-CM | POA: Diagnosis not present

## 2019-11-19 DIAGNOSIS — Z4889 Encounter for other specified surgical aftercare: Secondary | ICD-10-CM | POA: Diagnosis not present

## 2019-11-19 DIAGNOSIS — M719 Bursopathy, unspecified: Secondary | ICD-10-CM | POA: Diagnosis not present

## 2019-11-21 DIAGNOSIS — M549 Dorsalgia, unspecified: Secondary | ICD-10-CM | POA: Diagnosis not present

## 2019-11-21 MED FILL — CARISOPRODOL 350 MG TABS: 350 | 20 days supply | Qty: 60 | Fill #0

## 2019-11-26 DIAGNOSIS — M545 Low back pain: Secondary | ICD-10-CM | POA: Diagnosis not present

## 2019-11-28 ENCOUNTER — Telehealth: Payer: Self-pay

## 2019-11-28 MED FILL — KETOROLAC 10 MG TABLET: 10 | 5 days supply | Qty: 15 | Fill #0

## 2019-11-28 MED FILL — predniSONE 10 MG (21) TBPK: 10 | 6 days supply | Qty: 21 | Fill #0

## 2019-11-28 NOTE — Telephone Encounter (Signed)

## 2019-12-02 DIAGNOSIS — H04123 Dry eye syndrome of bilateral lacrimal glands: Secondary | ICD-10-CM | POA: Diagnosis not present

## 2019-12-02 DIAGNOSIS — H2513 Age-related nuclear cataract, bilateral: Secondary | ICD-10-CM | POA: Diagnosis not present

## 2019-12-02 DIAGNOSIS — H35372 Puckering of macula, left eye: Secondary | ICD-10-CM | POA: Diagnosis not present

## 2019-12-02 DIAGNOSIS — H524 Presbyopia: Secondary | ICD-10-CM | POA: Diagnosis not present

## 2019-12-02 DIAGNOSIS — H52203 Unspecified astigmatism, bilateral: Secondary | ICD-10-CM | POA: Diagnosis not present

## 2019-12-05 ENCOUNTER — Encounter: Payer: Self-pay | Admitting: Cardiovascular Disease

## 2019-12-05 ENCOUNTER — Telehealth (INDEPENDENT_AMBULATORY_CARE_PROVIDER_SITE_OTHER): Payer: Medicare Other | Admitting: Cardiovascular Disease

## 2019-12-05 VITALS — BP 107/61 | HR 70 | Ht 67.5 in | Wt 150.0 lb

## 2019-12-05 DIAGNOSIS — I868 Varicose veins of other specified sites: Secondary | ICD-10-CM | POA: Diagnosis not present

## 2019-12-05 DIAGNOSIS — I83893 Varicose veins of bilateral lower extremities with other complications: Secondary | ICD-10-CM

## 2019-12-05 DIAGNOSIS — I25118 Atherosclerotic heart disease of native coronary artery with other forms of angina pectoris: Secondary | ICD-10-CM | POA: Diagnosis not present

## 2019-12-05 NOTE — Progress Notes (Signed)
Virtual Visit via Telephone Note   This visit type was conducted due to national recommendations for restrictions regarding the COVID-19 Pandemic (e.g. social distancing) in an effort to limit this patient's exposure and mitigate transmission in our community.  Due to her co-morbid illnesses, this patient is at least at moderate risk for complications without adequate follow up.  This format is felt to be most appropriate for this patient at this time.  The patient did not have access to video technology/had technical difficulties with video requiring transitioning to audio format only (telephone).  All issues noted in this document were discussed and addressed.  No physical exam could be performed with this format.  Please refer to the patient's chart for her  consent to telehealth for [facility].   The patient was identified using 2 identifiers.  Date:  12/05/2019   ID:  Stacey Lawrence, DOB 05-15-1951, MRN [id number]  Patient Location: Home Provider Location: Office  PCP:  Celene Squibb, MD  Cardiologist:  Kate Sable, MD  Electrophysiologist:  None   Evaluation Performed:  Follow-Up Visit  Chief Complaint:  CAD  History of Present Illness:    Stacey Lawrence is a 70 y.o. female with CAD.  She was hospitalized in 2020 at [facility] for unstable angina. Coronary CT angiography in April 2020 showed no high grade obstructive disease. 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 Coronary calcium score was 0. 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  She was started on Cardizem CD 120 mg daily for possible microvascular dysfunction.  She has not tolerated nitrates.  She's having some tenderness at the SI joint fusion site (surgery on 05/02/19) which began about 2.5 weeks ago. She denies oozing. She sees her orthopedic physician tomorrow.  Her mother-in-law, Stacey Lawrence, moves in with she and her husband in April.  She has had significant memory  issues.   Past Medical History:  Diagnosis Date  . Anemia    hx  . Anxiety   . CAD (coronary artery disease)    a. 12/2012 NSTEMI/Cath: LM anomalous, arising in R cusp ant to RCA, otw nl, LAD 66m with bridge, RI nl, LCX nl, OM2 small, subtl occl w/ thrombus distally - felt to be spont dissection, too small for PCI->Med Rx, RCA large, dom, nl, PDA/PD nl, EF 55% w/ focal HK in mid-dist antlat wall;  b. 05/2013 Lexi CL: EF 77%, old small inferolat scar, no ischemia.  . Common migraine with intractable migraine 06/16/2017  . Depression   . Fibromyalgia   . GERD (gastroesophageal reflux disease)    hasn't started taking any meds  . History of blood transfusion    59yrs ago  . History of kidney stones   . Hx of colonic polyps   . IBS (irritable bowel syndrome)    constipation  . Joint pain   . Joint swelling   . Migraine    "q other week" (02/04/2016)  . Myocardial infarction ([place])   . Numbness    right leg  . Polycythemia   . PONV (postoperative nausea and vomiting)   . Raynaud's disease   . Scoliosis   . Shortness of breath dyspnea    with exertion  . SI (sacroiliac) joint dysfunction    right  . Wears glasses    Past Surgical History:  Procedure Laterality Date  . ABDOMINAL HYSTERECTOMY     partial  . APPENDECTOMY     with  explor. lap.  Marland Kitchen CARDIAC CATHETERIZATION    . CHOLECYSTECTOMY  10/28/2002   lap.  . COLONOSCOPY    . DEBRIDEMENT TENNIS ELBOW    . DIAGNOSTIC LAPAROSCOPY    . DILATION AND CURETTAGE OF UTERUS    . ELBOW SURGERY     golfer's elbow-bilateral  . ESOPHAGOGASTRODUODENOSCOPY  09/10/2012   Procedure: ESOPHAGOGASTRODUODENOSCOPY (EGD);  Surgeon: Rogene Houston, MD;  [facility];  Service: Endoscopy;  Laterality: N/A;  730  . EXCISION/RELEASE BURSA HIP Left 02/04/2016   Procedure: LEFT HIP TROCHANTERIC BURSECTOMY;  Surgeon: Mcarthur Rossetti, MD;  Location: [place];  Service: Orthopedics;  Laterality: Left;  . I & D EXTREMITY  02/25/2012    Procedure: IRRIGATION AND DEBRIDEMENT EXTREMITY;  Surgeon: Roseanne Kaufman, MD;  Location: [place];  Service: Orthopedics;  Laterality: Right;  Irrigation and Debridement and Repair Right Thumb Nerve Laceration and Assoiated Structures   . KNEE DEBRIDEMENT     right  . LAPAROTOMY    . LEFT HEART CATHETERIZATION WITH CORONARY ANGIOGRAM N/A 01/16/2013   Procedure: LEFT HEART CATHETERIZATION WITH CORONARY ANGIOGRAM;  Surgeon: Jolaine Artist, MD;  Location: [facility] CATH LAB;  Service: Cardiovascular;  Laterality: N/A;  . SACROILIAC JOINT FUSION Right 05/02/2019   Procedure: SACROILIAC JOINT FUSION;  Surgeon: Melina Schools, MD;  Location: [place];  Service: Orthopedics;  Laterality: Right;  SACROILIAC JOINT FUSION  . STAPEDECTOMY     right  . TONSILLECTOMY AND ADENOIDECTOMY    . TRIGGER FINGER RELEASE  08/19/2011   Procedure: RELEASE TRIGGER FINGER/A-1 PULLEY;  Surgeon: Willa Frater III;  Location: [place];  Service: Orthopedics;  Laterality: Right;  right middle finger a-1 pulley release with tenosynovectomy  . TROCHANTERIC BURSA EXCISION Right 02/04/2016  . TUBAL LIGATION    . TYMPANOPLASTY       Current Meds  Medication Sig  . Ascorbic Acid (VITAMIN C PO) Take 2 tablets by mouth daily.  . Biotin (BIOTIN 5000) 5 MG CAPS Take 5 mg by mouth daily.   . Calcium Citrate-Vitamin D (CALCITRATE/VITAMIN D PO) Take 1 tablet by mouth daily.  . carisoprodol (SOMA) 350 MG tablet Take 350 mg by mouth daily as needed for muscle spasms.   Marland Kitchen diltiazem (CARDIZEM CD) 120 MG 24 hr capsule Take 1 capsule (120 mg total) by mouth daily.  . DULoxetine (CYMBALTA) 60 MG capsule Take 1 capsule (60 mg total) by mouth daily.  Marland Kitchen gabapentin (NEURONTIN) 100 MG capsule Take 200 mg by mouth 2 (two) times daily.  . promethazine (PHENERGAN) 25 MG tablet Take 25 mg by mouth every 6 (six) hours as needed for nausea or vomiting.     Allergies:   Dilaudid [hydromorphone hcl], Codeine, and Morphine and related    Social History   Tobacco Use  . Smoking status: Never Smoker  . Smokeless tobacco: Never Used  Substance Use Topics  . Alcohol use: No    Alcohol/week: 0.0 standard drinks  . Drug use: No     Family Hx: The patient's family history includes ADD / ADHD in her son; COPD in her mother; Cancer in her father and mother; Depression in her brother, father, and mother; Heart disease in her father and mother; Heart failure in her mother; Varicose Veins in her father and mother.  ROS:   Please see the history of present illness.     All other systems reviewed and are negative.   Prior CV studies:   The following studies were reviewed  today:  Coronary CTA (01/02/19):  IMPRESSION: 1. Coronary calcium score of 0. This was 0 percentile for age and sex matched control.  2. 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  3. 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  4. No obstructive coronary artery disease.  5. Mild atherosclerosis of the descending aorta.  Left heart catheterization 01/16/2013 Findings:  Ao Pressure: 129/78 (102) LV Pressure: 126/2/8 There was no signficant gradient across the aortic valve on pullback.  Left main: Anomalous. Arising in the right cusp anterior to RCA origin. Long. No stenosis. Gives off LAD, Ramus and LCX.   LAD: Gives off 2 diagonals. There is an intramyocardial segment (bridge) in the mid LAD with 30% plaque. Otherwise normal.  RAMUS: Normal  LCX: Gives off small OM-1, OM-2 and OM-3. OM-2 is a very small caliber vessel which extends out to the distal anterolateral wall and apex. In the middle to distal OM-2 the vessel is subtotally occluded with thrombus  RCA: Large dominant vessel gives off large PDA and PL.  LV-gram done in the RAO projection: Ejection fraction = 55% focal area of hypokinesis in mid to distal anterolateral wall  Assessment: 1. CAD with occlusion of a small OM-2 2. 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 EF 55% with focal area of hypokinesis in mid to distal anterolateral wall  Plan/Discussion:  The OM-2 is too small for PCI. Will treat medically with aggressive RF modification. Start Plavix. ______________________________  2D echocardiogram 12/24/2018 [facility](outpatient echo) SUMMARY Injection of agitated saline contrast performed to evaluate for possible shunt The left ventricular size is normal.  Left ventricular systolic function is normal. LV ejection fraction = 55-60%.  Left ventricular filling pattern is prolonged relaxation. No segmental wall motion abnormalities seen in the left ventricle The right ventricle is normal in size and function. Injection of agitated saline showed no right-to-left shunt. There is mild mitral regurgitation. The aortic sinus is normal size. The IVC is normal in size with an inspiratory collapse of greater then 50%,  suggesting normal right atrial pressure. There is no pericardial effusion. There is no comparison study available. - FINDINGS:  LEFT VENTRICLE The left ventricular size is normal. There is normal left ventricular wall  thickness. Left ventricular systolic function is normal. LV ejection fraction  = 55-60%. Left ventricular filling pattern is prolonged relaxation. No  segmental wall motion abnormalities seen in the left ventricle.   Labs/Other Tests and Data Reviewed:    EKG:  No ECG reviewed.  Recent Labs: 04/29/2019: ALT 11; BUN 12; Creatinine, Ser 0.94; Hemoglobin 14.5; Platelets 247; Potassium 3.7; Sodium 142   Recent Lipid Panel Lab Results  Component Value Date/Time   CHOL 176 01/02/2019 02:27 AM   TRIG 53 01/02/2019 02:27 AM   HDL 80 01/02/2019 02:27 AM   CHOLHDL 2.2 01/02/2019 02:27 AM   LDLCALC 85 01/02/2019 02:27 AM    Wt Readings from Last 3 Encounters:  12/05/19 150  lb (68 kg)  05/02/19 151 lb 1.6 oz (68.5 kg)  04/29/19 151 lb 1.6 oz (68.5 kg)     Objective:    Vital Signs:  BP 107/61   Pulse 70   Ht 5' 7.5" (1.715 m)   Wt 150 lb (68 kg)   BMI 23.15 kg/m    VITAL SIGNS:  reviewed  ASSESSMENT & PLAN:    1. [id number] of myocardial infarction in 2014 secondary to spontaneous coronary artery dissection of  a small second obtuse marginal branch that was treated medically. She was also noted at that time to have a 30% mid LAD lesion but this was likely related to intramyocardial bridging. Symptoms are stable on Cardizem CD 120 mg daily.  She likely has some degree of microvascular dysfunction given her other vasospastic disease (Raynaud's).   2. Venous varicosities:Symptomatically stable with more swelling in the summer.  She uses compression stockings.    COVID-19 Education: The signs and symptoms of COVID-19 were discussed with the patient and how to seek care for testing (follow up with PCP or arrange E-visit).  The importance of social distancing was discussed today.  Time:   Today, I have spent 15 minutes with the patient with telehealth technology discussing the above problems.     Medication Adjustments/Labs and Tests Ordered: Current medicines are reviewed at length with the patient today.  Concerns regarding medicines are outlined above.   Tests Ordered: No orders of the defined types were placed in this encounter.   Medication Changes: No orders of the defined types were placed in this encounter.   Follow Up:  In Person in 6 month(s)  Signed, Kate Sable, MD  12/05/2019 1:47 PM    [facility]

## 2019-12-05 NOTE — Patient Instructions (Signed)
Medication Instructions:  Your physician recommends that you continue on your current medications as directed. Please refer to the Current Medication list given to you today.  *If you need a refill on your cardiac medications before your next appointment, please call your pharmacy*   Lab Work: None today If you have labs (blood work) drawn today and your tests are completely normal, you will receive your results only by: . MyChart Message (if you have MyChart) OR . A paper copy in the mail If you have any lab test that is abnormal or we need to change your treatment, we will call you to review the results.   Testing/Procedures: None today   Follow-Up: At [facility], you and your health needs are our priority.  As part of our continuing mission to provide you with exceptional heart care, we have created designated Provider Care Teams.  These Care Teams include your primary Cardiologist (physician) and Advanced Practice Providers (APPs -  Physician Assistants and Nurse Practitioners) who all work together to provide you with the care you need, when you need it.  We recommend signing up for the patient portal called "MyChart".  Sign up information is provided on this After Visit Summary.  MyChart is used to connect with patients for Virtual Visits (Telemedicine).  Patients are able to view lab/test results, encounter notes, upcoming appointments, etc.  Non-urgent messages can be sent to your provider as well.   To learn more about what you can do with MyChart, go to [email or web address].    Your next appointment:   6 month(s)  The format for your next appointment:   In Person  Provider:   Suresh Koneswaran, MD   Other Instructions None      Thank you for choosing [facility] !         

## 2019-12-06 ENCOUNTER — Other Ambulatory Visit: Payer: Self-pay | Admitting: Orthopedic Surgery

## 2019-12-06 DIAGNOSIS — Z4889 Encounter for other specified surgical aftercare: Secondary | ICD-10-CM

## 2019-12-06 DIAGNOSIS — M533 Sacrococcygeal disorders, not elsewhere classified: Secondary | ICD-10-CM | POA: Diagnosis not present

## 2019-12-07 ENCOUNTER — Ambulatory Visit: Payer: Medicare Other | Attending: Internal Medicine

## 2019-12-07 DIAGNOSIS — Z23 Encounter for immunization: Secondary | ICD-10-CM

## 2019-12-07 NOTE — Progress Notes (Signed)
   Covid-19 Vaccination Clinic  Name:  Stacey Lawrence    MRN: [id number] DOB: October 31, 1950  12/07/2019  Stacey Lawrence was observed post Covid-19 immunization for 15 minutes without incident. She was provided with Vaccine Information Sheet and instruction to access the V-Safe system.   Stacey Lawrence was instructed to call 911 with any severe reactions post vaccine: Marland Kitchen Difficulty breathing  . Swelling of face and throat  . A fast heartbeat  . A bad rash all over body  . Dizziness and weakness   Immunizations Administered    Name Date Dose VIS Date Route   Moderna COVID-19 Vaccine 12/07/2019  9:22 AM 0.5 mL 08/20/2019 Intramuscular   Manufacturer: Moderna   Lot: [id number]   [id number]

## 2019-12-16 ENCOUNTER — Ambulatory Visit
Admission: RE | Admit: 2019-12-16 | Discharge: 2019-12-16 | Disposition: A | Discharge status: Home / Self Care | Payer: Medicare Other | Source: Ambulatory Visit | Attending: Orthopedic Surgery | Admitting: Orthopedic Surgery

## 2019-12-16 ENCOUNTER — Other Ambulatory Visit: Payer: Self-pay

## 2019-12-16 DIAGNOSIS — M533 Sacrococcygeal disorders, not elsewhere classified: Secondary | ICD-10-CM | POA: Diagnosis not present

## 2019-12-16 DIAGNOSIS — Z4889 Encounter for other specified surgical aftercare: Secondary | ICD-10-CM

## 2019-12-16 MED FILL — GABAPENTIN 100 MG CAPSULE: 100 | 90 days supply | Qty: 270 | Fill #2
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
## 2019-12-19 ENCOUNTER — Other Ambulatory Visit: Payer: Self-pay

## 2019-12-19 ENCOUNTER — Encounter (HOSPITAL_COMMUNITY): Payer: Self-pay | Admitting: Psychiatry

## 2019-12-19 ENCOUNTER — Ambulatory Visit (INDEPENDENT_AMBULATORY_CARE_PROVIDER_SITE_OTHER): Payer: Medicare Other | Admitting: Psychiatry

## 2019-12-19 DIAGNOSIS — F330 Major depressive disorder, recurrent, mild: Secondary | ICD-10-CM

## 2019-12-19 MED ORDER — DULOXETINE HCL 30 MG PO CPEP: ORAL_CAPSULE | ORAL | 1 refills | Status: DC

## 2019-12-19 MED FILL — DULoxetine HCL 30 MG CPEP: 30 | 30 days supply | Qty: 30 | Fill #0

## 2019-12-20 ENCOUNTER — Ambulatory Visit (HOSPITAL_COMMUNITY): Payer: Medicare Other | Admitting: Psychiatry

## 2019-12-20 DIAGNOSIS — M9904 Segmental and somatic dysfunction of sacral region: Secondary | ICD-10-CM | POA: Diagnosis not present

## 2019-12-23 ENCOUNTER — Other Ambulatory Visit: Payer: Medicare Other

## 2020-01-08 ENCOUNTER — Ambulatory Visit: Payer: Medicare Other | Attending: Internal Medicine

## 2020-01-08 DIAGNOSIS — Z23 Encounter for immunization: Secondary | ICD-10-CM

## 2020-01-08 NOTE — Progress Notes (Signed)
   Covid-19 Vaccination Clinic  Name:  Stacey Lawrence    MRN: [id number] DOB: 1950-10-03  01/08/2020  [name] was observed post Covid-19 immunization for 15 minutes without incident. She was provided with Vaccine Information Sheet and instruction to access the V-Safe system.   [name] was instructed to call 911 with any severe reactions post vaccine: Marland Kitchen Difficulty breathing  . Swelling of face and throat  . A fast heartbeat  . A bad rash all over body  . Dizziness and weakness   Immunizations Administered    Name Date Dose VIS Date Route   Moderna COVID-19 Vaccine 01/08/2020  9:14 AM 0.5 mL 08/2019 Intramuscular   Manufacturer: Levan Hurst   Lot: [id number]   [place]: [id number]      Covid-19 Vaccination Clinic  Name:  Stacey Lawrence    MRN: [id number] DOB: October 01, 1950  01/08/2020  [name] was observed post Covid-19 immunization for 15 minutes without incident. She was provided with Vaccine Information Sheet and instruction to access the V-Safe system.   [name] was instructed to call 911 with any severe reactions post vaccine: Marland Kitchen Difficulty breathing  . Swelling of face and throat  . A fast heartbeat  . A bad rash all over body  . Dizziness and weakness   Immunizations Administered    Name Date Dose VIS Date Route   Moderna COVID-19 Vaccine 01/08/2020  9:14 AM 0.5 mL 08/2019 Intramuscular   Manufacturer: Moderna   Lot: [id number]   [place]:[id number]

## 2020-01-14 DIAGNOSIS — M9904 Segmental and somatic dysfunction of sacral region: Secondary | ICD-10-CM | POA: Diagnosis not present

## 2020-01-16 IMAGING — CR CHEST - 2 VIEW
2 series · 2 of 2 positions shown · non-contrast
Comparison: Radiographs December 09, 2016.

CLINICAL DATA: Chest pain.

EXAM:
CHEST - 2 VIEW

[chest pa]
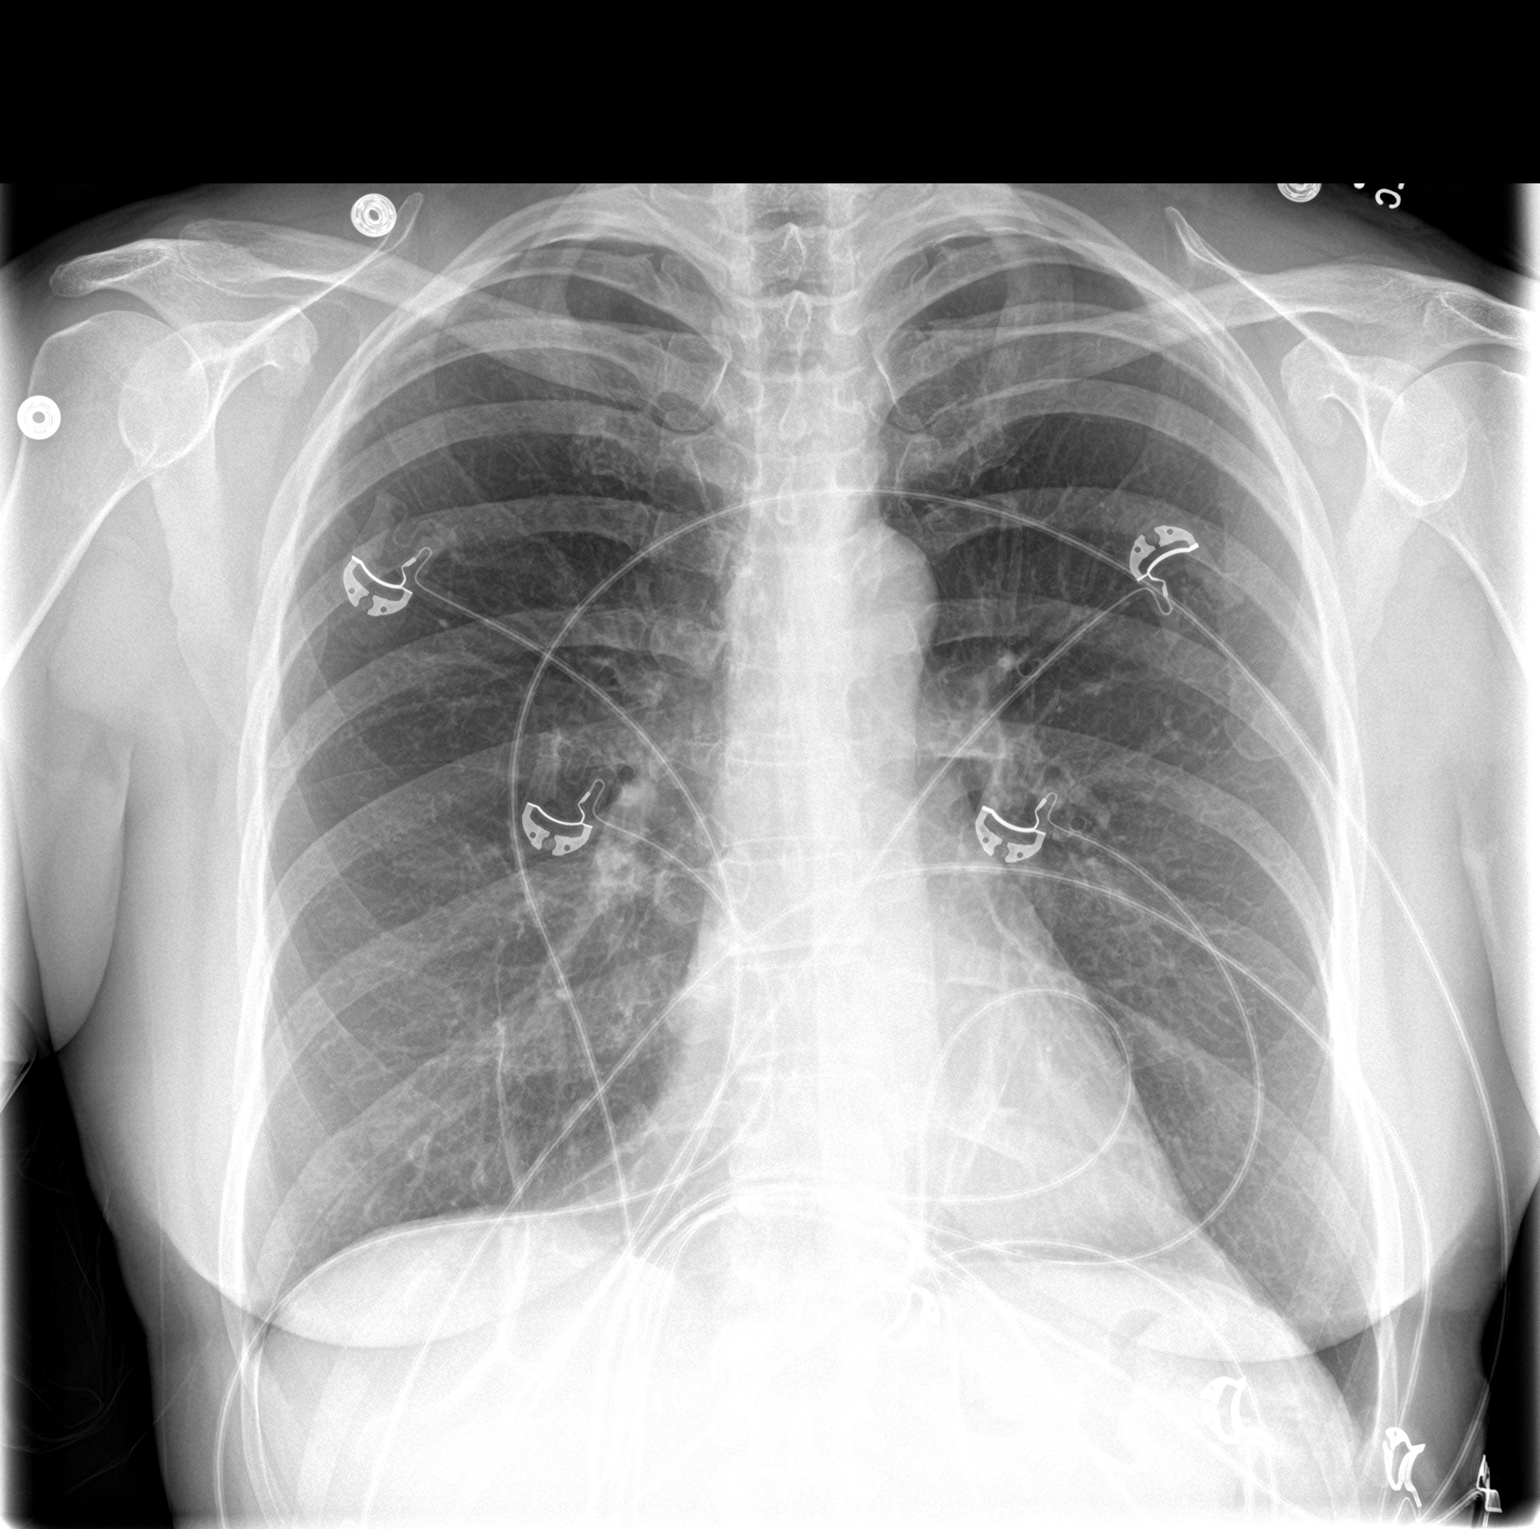

[chest lat]
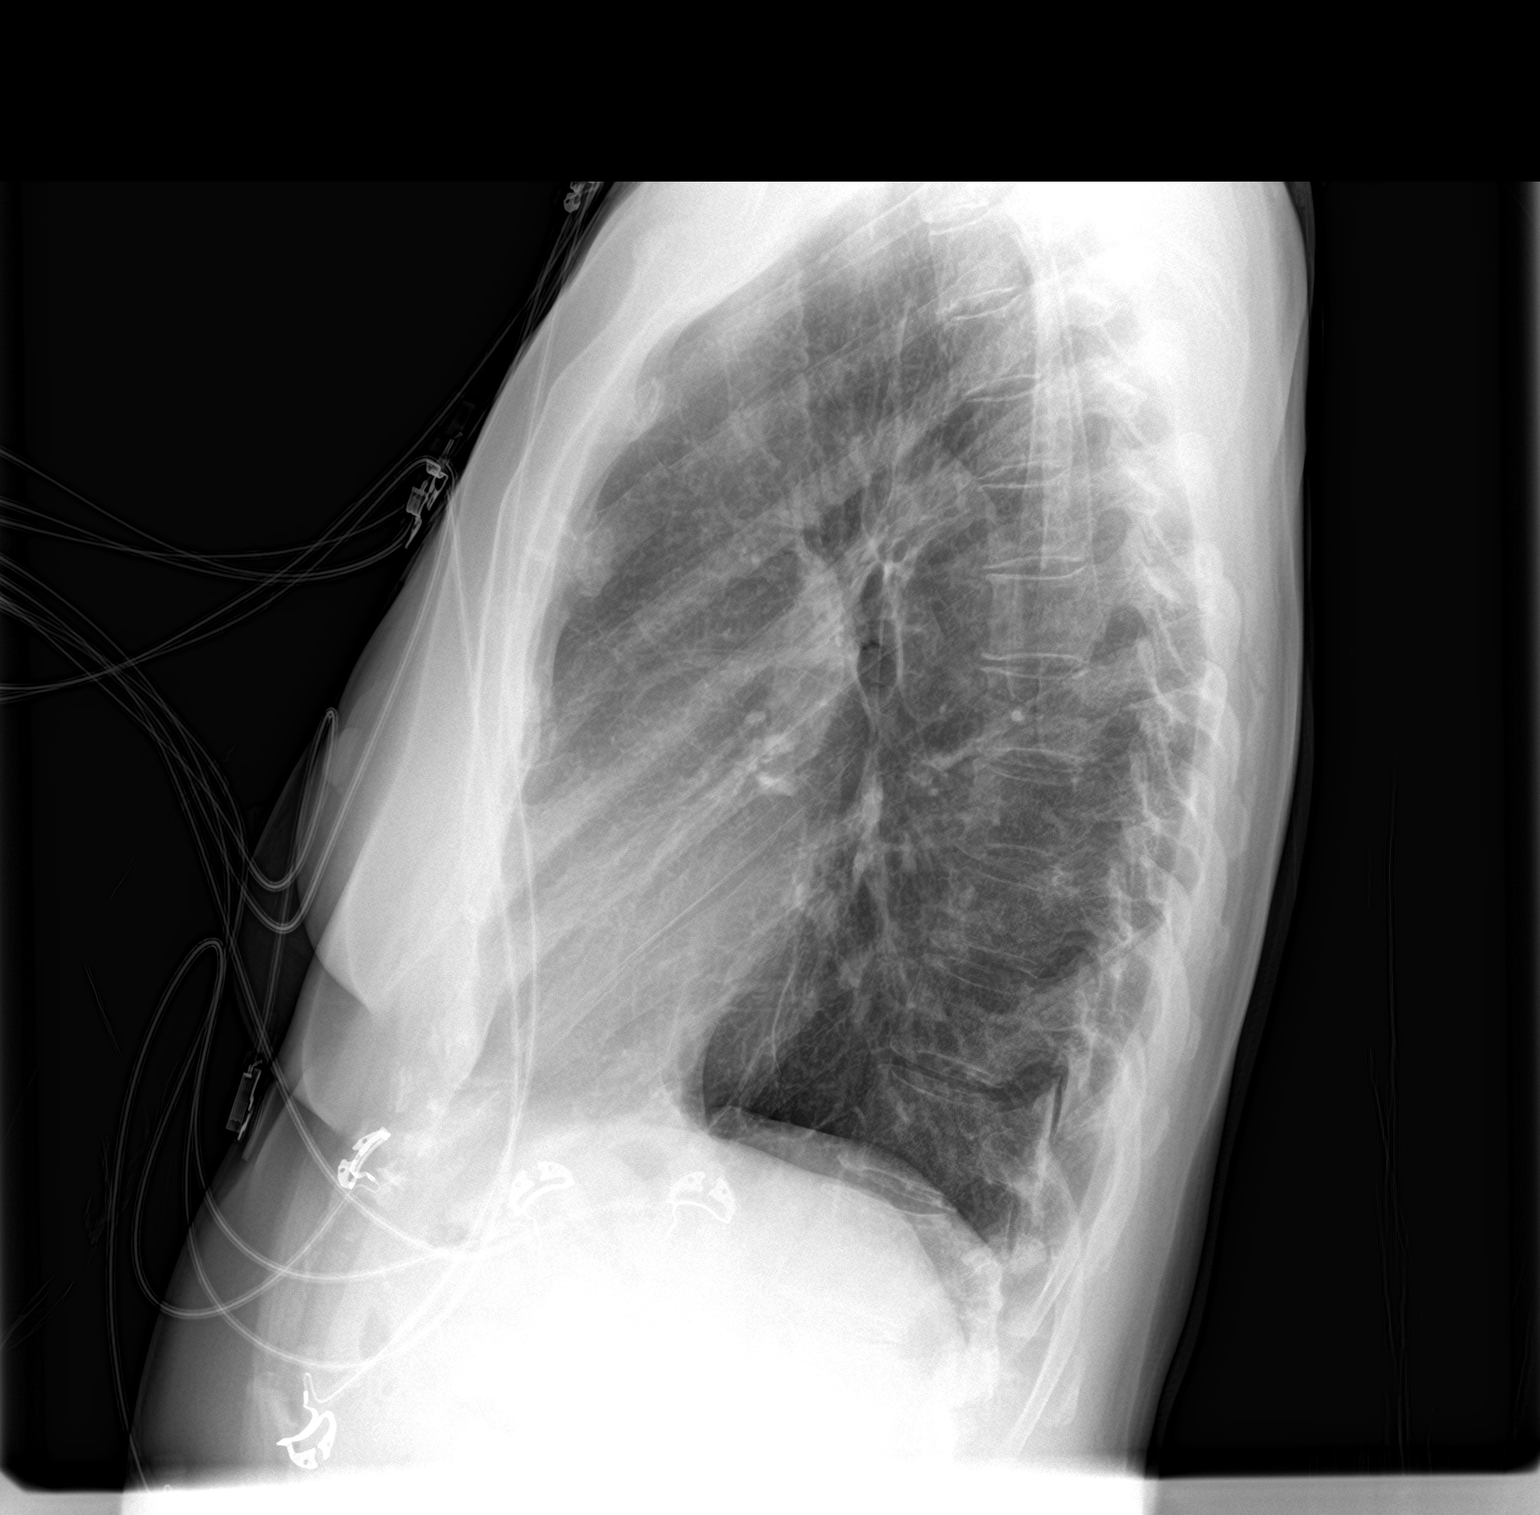

[2 of 2 positions shown; findings below may reference images not displayed]

FINDINGS: The heart size and mediastinal contours are within normal limits.
Both lungs are clear. No pneumothorax or pleural effusion is noted.
The visualized skeletal structures are unremarkable.
IMPRESSION: No active cardiopulmonary disease.

## 2020-01-17 ENCOUNTER — Telehealth (HOSPITAL_COMMUNITY): Payer: Self-pay | Admitting: Psychiatry

## 2020-01-17 ENCOUNTER — Telehealth (HOSPITAL_COMMUNITY): Payer: Medicare Other | Admitting: Psychiatry

## 2020-01-17 ENCOUNTER — Other Ambulatory Visit: Payer: Self-pay

## 2020-01-17 NOTE — Telephone Encounter (Signed)
Opened in error

## 2020-01-17 NOTE — Telephone Encounter (Signed)
Therapist attempted to contact patient twice via text through caregility platform, no response.  Therapist called patient and left message indicating attempt and requesting patient call office 

## 2020-01-28 DIAGNOSIS — H04123 Dry eye syndrome of bilateral lacrimal glands: Secondary | ICD-10-CM | POA: Diagnosis not present

## 2020-01-28 DIAGNOSIS — H02886 Meibomian gland dysfunction of left eye, unspecified eyelid: Secondary | ICD-10-CM | POA: Diagnosis not present

## 2020-01-28 DIAGNOSIS — H02889 Meibomian gland dysfunction of unspecified eye, unspecified eyelid: Secondary | ICD-10-CM | POA: Diagnosis not present
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
## 2020-02-05 ENCOUNTER — Encounter (HOSPITAL_COMMUNITY): Payer: Self-pay | Admitting: Psychiatry

## 2020-02-05 ENCOUNTER — Other Ambulatory Visit: Payer: Self-pay

## 2020-02-05 ENCOUNTER — Telehealth (INDEPENDENT_AMBULATORY_CARE_PROVIDER_SITE_OTHER): Payer: Medicare Other | Admitting: Psychiatry

## 2020-02-05 DIAGNOSIS — M5416 Radiculopathy, lumbar region: Secondary | ICD-10-CM | POA: Diagnosis not present

## 2020-02-05 DIAGNOSIS — M533 Sacrococcygeal disorders, not elsewhere classified: Secondary | ICD-10-CM | POA: Diagnosis not present

## 2020-02-05 DIAGNOSIS — F330 Major depressive disorder, recurrent, mild: Secondary | ICD-10-CM | POA: Diagnosis not present

## 2020-02-05 MED ORDER — DULOXETINE HCL 30 MG PO CPEP: ORAL_CAPSULE | ORAL | 0 refills | Status: DC

## 2020-02-05 MED ORDER — DULOXETINE HCL 60 MG PO CPEP: 60.0000 mg | ORAL_CAPSULE | Freq: Every day | ORAL | 0 refills | Status: DC

## 2020-02-05 MED FILL — DULoxetine HCL 30 MG CPEP: 30 | 90 days supply | Qty: 90 | Fill #0

## 2020-02-05 MED FILL — DULOXETINE HCL 60 MG CPEP: 60 | 90 days supply | Qty: 90 | Fill #0

## 2020-02-05 NOTE — Patient Instructions (Signed)
1.Continue duloxetine 90 mg (60 mg + 30 mg) daily  2. Next appointment:8/10 at 8:50

## 2020-02-06 DIAGNOSIS — Z8679 Personal history of other diseases of the circulatory system: Secondary | ICD-10-CM | POA: Diagnosis not present

## 2020-02-06 DIAGNOSIS — Z1379 Encounter for other screening for genetic and chromosomal anomalies: Secondary | ICD-10-CM | POA: Diagnosis not present

## 2020-02-06 MED FILL — predniSONE 10 MG TABS: 10 | 6 days supply | Qty: 21 | Fill #0

## 2020-02-07 MED FILL — CARISOPRODOL 350 MG TABS: 350 | 20 days supply | Qty: 60 | Fill #1

## 2020-02-07 MED FILL — CELECOXIB 200 MG CAP: 200 | 30 days supply | Qty: 30 | Fill #0

## 2020-02-10 DIAGNOSIS — M545 Low back pain: Secondary | ICD-10-CM | POA: Diagnosis not present

## 2020-02-10 DIAGNOSIS — M5416 Radiculopathy, lumbar region: Secondary | ICD-10-CM | POA: Diagnosis not present

## 2020-02-10 DIAGNOSIS — M9904 Segmental and somatic dysfunction of sacral region: Secondary | ICD-10-CM | POA: Diagnosis not present

## 2020-02-11 DIAGNOSIS — M533 Sacrococcygeal disorders, not elsewhere classified: Secondary | ICD-10-CM | POA: Diagnosis not present

## 2020-02-11 DIAGNOSIS — M5416 Radiculopathy, lumbar region: Secondary | ICD-10-CM | POA: Diagnosis not present

## 2020-02-11 DIAGNOSIS — M545 Low back pain: Secondary | ICD-10-CM | POA: Diagnosis not present

## 2020-02-11 MED FILL — predniSONE 20 MG TABS: 20 | 10 days supply | Qty: 20 | Fill #0

## 2020-02-11 MED FILL — traMADol HCL 50 MG TABS: 50 | 5 days supply | Qty: 20 | Fill #0

## 2020-02-14 DIAGNOSIS — M9904 Segmental and somatic dysfunction of sacral region: Secondary | ICD-10-CM | POA: Diagnosis not present

## 2020-02-18 ENCOUNTER — Telehealth: Payer: Self-pay

## 2020-02-18 ENCOUNTER — Emergency Department (HOSPITAL_COMMUNITY)
Admission: EM | Admit: 2020-02-18 | Discharge: 2020-02-18 | Disposition: A | Discharge status: Home / Self Care | Payer: Medicare Other | Attending: Emergency Medicine | Admitting: Emergency Medicine

## 2020-02-18 ENCOUNTER — Other Ambulatory Visit: Payer: Self-pay

## 2020-02-18 ENCOUNTER — Encounter (HOSPITAL_COMMUNITY): Payer: Self-pay | Admitting: Emergency Medicine

## 2020-02-18 DIAGNOSIS — M79604 Pain in right leg: Secondary | ICD-10-CM | POA: Insufficient documentation

## 2020-02-18 DIAGNOSIS — Z5321 Procedure and treatment not carried out due to patient leaving prior to being seen by health care provider: Secondary | ICD-10-CM | POA: Insufficient documentation

## 2020-02-18 NOTE — ED Notes (Signed)
Pt stated that she does not want to stay due to the wait.

## 2020-02-18 NOTE — Telephone Encounter (Signed)
Pt states her MD recommeded being seen by neurology for back and hip pain due to a fall she had post surgery. Pt is not interested in being seen for migraine fu with NP   Per NP informed pt that office policy requires new problem visits to be scheduled with MD only  Pt voiced understanding and states she will call her doctor to send referral to our office

## 2020-02-18 NOTE — Telephone Encounter (Signed)
Thank you for the update. She needs a new referral and we will be happy to have her evaluated by a neurologist as appropriate.

## 2020-02-18 NOTE — Progress Notes (Deleted)
PATIENT: Stacey Lawrence DOB: 1950-09-25  REASON FOR VISIT: follow up HISTORY FROM: patient  No chief complaint on file.    HISTORY OF PRESENT ILLNESS: Today 02/18/20 Stacey Lawrence is a 69 y.o. female here today for follow up.   HISTORY: (copied from my note on 02/20/2019)  Stacey Lawrence is a 70 y.o. female for follow up of essential tremor.  She continues gabapentin 200 mg daily.  She does feel that this helps with her tremor, however, she notes that tremor waxes and wanes.  Tremor is definitely worse after a long or stressful day.  Yesterday she worked in her garden and noted worsening afterwards.  She reports that her headaches have been well managed.  She usually has 1-2 migraines per month.  This is significantly reduced from her baseline.  She feels that she is doing well and without concerns today.   HISTORY (copied from [facility] not on 06/26/2018)  [name] a 69 year old female with a history of essential tremor. She returns today for follow-up. She currently taking gabapentin 100 mg 3 times a day. The prescriptions is written that she can take 200 mg 3 times a day. She states that she only takes 200 mg and she knows she is in a half to do something. That the tremor will be exacerbated. She states that they continue tablets does help her tremor. She does feel that over time it has gotten worse. She also reports that she is been having dizziness. She has followed with ENT and was treated for an ear infection however the dizziness has continued. She reports that her ENT did not feel that it was related to her ears. The patient states that the dizziness only occurs with positional changes. She states that she can be driving and look to the right or left and she will get a mild spinning sensation that resolved in several seconds. She states that the same thing occurring that she bends over and stands up. She denies any associated symptoms. She  reports that her headaches have been under relatively good control with gabapentin. She states that the dizziness does not occur with her headaches. She returns today for evaluation.   HISTORY03/28/19:StaceyO'Brienis a 69 year old right-handed white female with history of an essential tremor. The patient indicates that she is having difficulty performing tasks that require fine motor control. She likes to paint and hadto give up painting detailed projects. She also has had to give upsewingin part. The patient has tremors on both arms. She denies any vocal tremor or head or neck tremor. She also has had an increase in her migraine headaches that are now on average 2 times a week, oftentimes coming on at night and may respond to caffeinated products. The patient has been on Cymbalta, the dose was recently increased one week ago. The patient claims that her maternal grandmother also had a similar essential tremor. The patient comes the office today for an evaluation.   REVIEW OF SYSTEMS: Out of a complete 14 system review of symptoms, the patient complains only of the following symptoms, and all other reviewed systems are negative.  ALLERGIES: Allergies  Allergen Reactions   Dilaudid [Hydromorphone Hcl] Other (See Comments)    Resp. arrest   Codeine Nausea And Vomiting and Rash   Morphine And Related Nausea And Vomiting    HOME MEDICATIONS: Outpatient Medications Prior to Visit  Medication Sig Dispense Refill   Ascorbic Acid (VITAMIN C PO) Take 2 tablets by mouth  daily.     Biotin (BIOTIN 5000) 5 MG CAPS Take 5 mg by mouth daily.      Calcium Citrate-Vitamin D (CALCITRATE/VITAMIN D PO) Take 1 tablet by mouth daily.     carisoprodol (SOMA) 350 MG tablet Take 350 mg by mouth daily as needed for muscle spasms.      diltiazem (CARDIZEM CD) 120 MG 24 hr capsule Take 1 capsule (120 mg total) by mouth daily. 90 capsule 3   DULoxetine (CYMBALTA) 30 MG capsule 90 mg daily (take along  with 60 mg) 90 capsule 0   DULoxetine (CYMBALTA) 60 MG capsule Take 1 capsule (60 mg total) by mouth daily. 90 capsule 0   gabapentin (NEURONTIN) 100 MG capsule Take 200 mg by mouth 2 (two) times daily.     promethazine (PHENERGAN) 25 MG tablet Take 25 mg by mouth every 6 (six) hours as needed for nausea or vomiting.     No facility-administered medications prior to visit.    PAST MEDICAL HISTORY: Past Medical History:  Diagnosis Date   Anemia    hx   Anxiety    CAD (coronary artery disease)    a. 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 05/2013 Lexi CL: EF 77%, old small inferolat scar, no ischemia.   Common migraine with intractable migraine 06/16/2017   Depression    Fibromyalgia    GERD (gastroesophageal reflux disease)    hasn't started taking any meds   History of blood transfusion    20yrs ago   History of kidney stones    Hx of colonic polyps    IBS (irritable bowel syndrome)    constipation   Joint pain    Joint swelling    Migraine    "q other week" (02/04/2016)   Myocardial infarction (HCC)    Numbness    right leg   Polycythemia    PONV (postoperative nausea and vomiting)    Raynaud's disease    Scoliosis    Shortness of breath dyspnea    with exertion   SI (sacroiliac) joint dysfunction    right   Wears glasses     PAST SURGICAL HISTORY: Past Surgical History:  Procedure Laterality Date   ABDOMINAL HYSTERECTOMY     partial   APPENDECTOMY     with explor. lap.   CARDIAC CATHETERIZATION     CHOLECYSTECTOMY  10/28/2002   lap.   COLONOSCOPY     DEBRIDEMENT TENNIS ELBOW     DIAGNOSTIC LAPAROSCOPY     DILATION AND CURETTAGE OF UTERUS     ELBOW SURGERY     golfer's  elbow-bilateral   ESOPHAGOGASTRODUODENOSCOPY  09/10/2012   Procedure: ESOPHAGOGASTRODUODENOSCOPY (EGD);  Surgeon: Rogene Houston, MD;  Location: [facility];  Service: Endoscopy;  Laterality: N/A;  730   EXCISION/RELEASE BURSA HIP Left 02/04/2016   Procedure: LEFT HIP TROCHANTERIC BURSECTOMY;  Surgeon: Mcarthur Rossetti, MD;  Location: [place];  Service: Orthopedics;  Laterality: Left;   I & D EXTREMITY  02/25/2012   Procedure: IRRIGATION AND DEBRIDEMENT EXTREMITY;  Surgeon: Roseanne Kaufman, MD;  Location: [place];  Service: Orthopedics;  Laterality: Right;  Irrigation and Debridement and Repair Right Thumb Nerve Laceration and Assoiated Structures  KNEE DEBRIDEMENT     right   LAPAROTOMY     LEFT HEART CATHETERIZATION WITH CORONARY ANGIOGRAM N/A 01/16/2013   Procedure: LEFT HEART CATHETERIZATION WITH CORONARY ANGIOGRAM;  Surgeon: Jolaine Artist, MD;  Location: [facility] CATH [facility];  Service: Cardiovascular;  Laterality: N/A;   SACROILIAC JOINT FUSION Right 05/02/2019   Procedure: SACROILIAC JOINT FUSION;  Surgeon: Melina Schools, MD;  Location: [place];  Service: Orthopedics;  Laterality: Right;  SACROILIAC JOINT FUSION   STAPEDECTOMY     right   TONSILLECTOMY AND ADENOIDECTOMY     TRIGGER FINGER RELEASE  08/19/2011   Procedure: RELEASE TRIGGER FINGER/A-1 PULLEY;  Surgeon: Willa Frater III;  Location: [place];  Service: Orthopedics;  Laterality: Right;  right middle finger a-1 pulley release with tenosynovectomy   TROCHANTERIC BURSA EXCISION Right 02/04/2016   TUBAL LIGATION     TYMPANOPLASTY      FAMILY HISTORY: Family History  Problem Relation Age of Onset   Cancer Mother        Deceased with lung CA   Heart failure Mother    Heart disease Mother    COPD Mother    Varicose Veins Mother    Depression Mother    Cancer Father        Deceased with lung CA   Heart disease Father    Varicose Veins Father    Depression Father    Depression  Brother    ADD / ADHD Son     SOCIAL HISTORY: Social History   Socioeconomic History   Marital status: Married    Spouse name: Not on file   Number of children: 2   Years of education: 14   Highest education level: Not on file  Occupational History   Not on file  Tobacco Use   Smoking status: Never Smoker   Smokeless tobacco: Never Used  Substance and Sexual Activity   Alcohol use: No    Alcohol/week: 0.0 standard drinks   Drug use: No   Sexual activity: Not Currently    Birth control/protection: Surgical  Other Topics Concern   Not on file  Social History Narrative   Lives in [place] with husband.     Grown children.     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   Caffeine use: none   Social Determinants of Radio broadcast assistant Strain:    Difficulty of Paying Living Expenses:   Food Insecurity:    Worried About Charity fundraiser in the Last Year:    Arboriculturist in the Last Year:   Transportation Needs:    Film/video editor (Medical):    Lack of Transportation (Non-Medical):   Physical Activity:    Days of Exercise per Week:    Minutes of Exercise per Session:   Stress:    Feeling of Stress :   Social Connections:    Frequency of Communication with Friends and Family:    Frequency of Social Gatherings with Friends and Family:    Attends Religious Services:    Active Member of Clubs or Organizations:    Attends Music therapist:    Marital Status:   Intimate Partner Violence:    Fear of Current or Ex-Partner:    Emotionally Abused:    Physically Abused:    Sexually Abused:       PHYSICAL EXAM  There were no vitals filed for this visit. There is no  height or weight on file to calculate BMI.  Generalized: Well developed, in no acute distress  Cardiology: normal rate and rhythm, no murmur noted Respiratory: clear to auscultation bilaterally  Neurological examination    Mentation: Alert oriented to time, place, history taking. Follows all commands speech and language fluent Cranial nerve II-XII: Pupils were equal round reactive to light. Extraocular movements were full, visual field were full on confrontational test. Facial sensation and strength were normal. Uvula tongue midline. Head turning and shoulder shrug  were normal and symmetric. Motor: The motor testing reveals 5 over 5 strength of all 4 extremities. Good symmetric motor tone is noted throughout.  Sensory: Sensory testing is intact to soft touch on all 4 extremities. No evidence of extinction is noted.  Coordination: Cerebellar testing reveals good finger-nose-finger and heel-to-shin bilaterally.  Gait and station: Gait is normal. Tandem gait is normal. Romberg is negative. No drift is seen.  Reflexes: Deep tendon reflexes are symmetric and normal bilaterally.   DIAGNOSTIC DATA (LABS, IMAGING, TESTING) - I reviewed patient records, labs, notes, testing and imaging myself where available.  No flowsheet data found.   Lab Results  Component Value Date   WBC 5.0 04/29/2019   HGB 14.5 04/29/2019   HCT 46.8 (H) 04/29/2019   MCV 92.9 04/29/2019   PLT 247 04/29/2019      Component Value Date/Time   NA 142 04/29/2019 0851   K 3.7 04/29/2019 0851   CL 105 04/29/2019 0851   CO2 27 04/29/2019 0851   GLUCOSE 87 04/29/2019 0851   BUN 12 04/29/2019 0851   CREATININE 0.94 04/29/2019 0851   CREATININE 0.79 07/16/2014 1707   CALCIUM 9.3 04/29/2019 0851   PROT 6.7 04/29/2019 0851   ALBUMIN 3.9 04/29/2019 0851   AST 20 04/29/2019 0851   ALT 11 04/29/2019 0851   ALKPHOS 99 04/29/2019 0851   BILITOT 0.6 04/29/2019 0851   GFRNONAA >60 04/29/2019 0851   GFRAA >60 04/29/2019 0851   Lab Results  Component Value Date   CHOL 176 01/02/2019   HDL 80 01/02/2019   LDLCALC 85 01/02/2019   TRIG 53 01/02/2019   CHOLHDL 2.2 01/02/2019   Lab Results  Component Value Date   HGBA1C 5.7 (H) 10/20/2014    No results found for: [id number] Lab Results  Component Value Date   TSH 3.061 10/20/2014       ASSESSMENT AND PLAN 69 y.o. year old female  has a past medical history of Anemia, Anxiety, CAD (coronary artery disease), Common migraine with intractable migraine (06/16/2017), Depression, Fibromyalgia, GERD (gastroesophageal reflux disease), History of blood transfusion, History of kidney stones, colonic polyps, IBS (irritable bowel syndrome), Joint pain, Joint swelling, Migraine, Myocardial infarction ([place]), Numbness, Polycythemia, PONV (postoperative nausea and vomiting), Raynaud's disease, Scoliosis, Shortness of breath dyspnea, SI (sacroiliac) joint dysfunction, and Wears glasses. here with ***  No diagnosis found.     No orders of the defined types were placed in this encounter.    No orders of the defined types were placed in this encounter.     I spent 15 minutes with the patient. 50% of this time was spent counseling and educating patient on plan of care and medications.    Debbora Presto, FNP-C 02/18/2020, 12:28 PM [facility] [address] [place], [place] [address] [phone]

## 2020-02-18 NOTE — ED Triage Notes (Signed)
Pt reports a right hip joint fusion in August and saw an orthopedic doc last Friday due to on going right leg pain. Endorses frequent falls due to the pain. Reports she is being seen at a pain clinic but not heard back this week.

## 2020-02-19 ENCOUNTER — Ambulatory Visit: Payer: Medicare Other | Admitting: Family Medicine

## 2020-02-19 ENCOUNTER — Telehealth: Payer: Self-pay | Admitting: Neurology

## 2020-02-19 ENCOUNTER — Telehealth: Payer: Self-pay | Admitting: *Deleted

## 2020-02-19 MED FILL — traZODone HCL 50 MG TABS: 50 | 30 days supply | Qty: 30 | Fill #0

## 2020-02-19 MED FILL — traMADol HCL 50 MG TABS: 50 | 5 days supply | Qty: 20 | Fill #0

## 2020-02-19 NOTE — Telephone Encounter (Signed)
I called pt and she has been having an ongoing problem with her R leg, not being able to walk, had a R hip SI fusion 05-05-19 a year ago. Since 3-6 months ago she has been having issues with not being able to walk, having falls, R leg pain, numbness, (constant now, cold foot at times.).  Has seen Dr. Nelva Bush, PM as well.  She has had multiple mris, she has discs Saw Dr. Rolena Infante, last Friday and Tues Dr. Nelva Bush NP.  (she states that Dr. Rolena Infante cannot help her) and he stated needs to see neurologist.  Has seen Dr. Jannifer Franklin.  Since new problem will need to see MD.  [phone].

## 2020-02-19 NOTE — Telephone Encounter (Signed)
I did relay to pt since we have not seen her previously for this problem it will need to go to MD.  [phone] is Dr. Rolena Infante # for records.

## 2020-02-19 NOTE — Telephone Encounter (Signed)
Is on-call physician, and received a call from paging service on 8:08 AM, patient complains of difficulty using right leg and falling,  Please call patient as soon as possible to triage

## 2020-02-19 NOTE — Telephone Encounter (Signed)
I called and spoke to pt who stated the ref ofc was suppose to have faxed over referral on Friday. I let pt know we have not gotten the referral and I would contact their office to see if they can re fax it. I call Dr. Rolena Infante ofc [phone] and left voice mail to have referral faxed over w office notes so that our office and call pt back to sched the apt.    Stacey Lawrence

## 2020-02-19 NOTE — Telephone Encounter (Signed)
See other note

## 2020-02-27 ENCOUNTER — Telehealth (HOSPITAL_COMMUNITY): Payer: Self-pay | Admitting: Physical Therapy

## 2020-02-27 ENCOUNTER — Ambulatory Visit (HOSPITAL_COMMUNITY): Payer: Medicare Other | Admitting: Physical Therapy

## 2020-02-27 ENCOUNTER — Encounter (HOSPITAL_COMMUNITY): Payer: Self-pay

## 2020-02-27 NOTE — Telephone Encounter (Signed)
Please disregard previous not. Pt has mother with alzheimers that is sick and could not come in today

## 2020-02-27 NOTE — Telephone Encounter (Signed)
pt cancelled appt for today because she has been up all night with a stomach virus

## 2020-02-29 DIAGNOSIS — L089 Local infection of the skin and subcutaneous tissue, unspecified: Secondary | ICD-10-CM | POA: Diagnosis not present

## 2020-03-18 ENCOUNTER — Ambulatory Visit (HOSPITAL_COMMUNITY): Payer: Medicare Other | Attending: Chiropractic Medicine | Admitting: Physical Therapy

## 2020-03-18 ENCOUNTER — Other Ambulatory Visit: Payer: Self-pay

## 2020-03-18 DIAGNOSIS — M5417 Radiculopathy, lumbosacral region: Secondary | ICD-10-CM | POA: Insufficient documentation

## 2020-03-18 NOTE — Therapy (Signed)
[place], [place], [address] Phone: [phone]   Fax:  [phone]  Physical Therapy Evaluation  Patient Details  Name: Stacey Lawrence MRN: [id number] Date of Birth: 1951-04-03 Referring Provider (PT): Levy Pupa   Encounter Date: 03/18/2020   PT End of Session - 03/18/20 1029    Visit Number 1    Number of Visits 12    Date for PT Re-Evaluation 04/29/20    Authorization Type medicare    Progress Note Due on Visit 10    PT Start Time 0945    PT Stop Time 1030    PT Time Calculation (min) 45 min    Activity Tolerance Patient tolerated treatment well    Behavior During Therapy [facility] for tasks assessed/performed           Past Medical History:  Diagnosis Date  . Anemia    hx  . Anxiety   . CAD (coronary artery disease)    a. 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 05/2013 Lexi CL: EF 77%, old small inferolat scar, no ischemia.  . Common migraine with intractable migraine 06/16/2017  . Depression   . Fibromyalgia   . GERD (gastroesophageal reflux disease)    hasn't started taking any meds  . History of blood transfusion    44yrs ago  . History of kidney stones   . Hx of colonic polyps   . IBS (irritable bowel syndrome)    constipation  . Joint pain   . Joint swelling   . Migraine    "q other week" (02/04/2016)  . Myocardial infarction ([place])   . Numbness    right leg  . Polycythemia   . PONV (postoperative nausea and vomiting)   . Raynaud's disease   . Scoliosis   . Shortness of breath dyspnea    with exertion  . SI (sacroiliac) joint dysfunction    right  . Wears glasses     Past Surgical History:  Procedure Laterality Date  . ABDOMINAL  HYSTERECTOMY     partial  . APPENDECTOMY     with explor. lap.  Marland Kitchen CARDIAC CATHETERIZATION    . CHOLECYSTECTOMY  10/28/2002   lap.  . COLONOSCOPY    . DEBRIDEMENT TENNIS ELBOW    . DIAGNOSTIC LAPAROSCOPY    . DILATION AND CURETTAGE OF UTERUS    . ELBOW SURGERY     golfer's elbow-bilateral  . ESOPHAGOGASTRODUODENOSCOPY  09/10/2012   Procedure: ESOPHAGOGASTRODUODENOSCOPY (EGD);  Surgeon: Rogene Houston, MD;  [facility];  Service: Endoscopy;  Laterality: N/A;  730  . EXCISION/RELEASE BURSA HIP Left 02/04/2016   Procedure: LEFT HIP TROCHANTERIC BURSECTOMY;  Surgeon: Mcarthur Rossetti, MD;  Location: [facility];  Service: Orthopedics;  Laterality: Left;  . I & D EXTREMITY  02/25/2012   Procedure: IRRIGATION AND DEBRIDEMENT EXTREMITY;  Surgeon: Roseanne Kaufman, MD;  Location: [facility];  Service: Orthopedics;  Laterality: Right;  Irrigation and Debridement and Repair Right Thumb Nerve Laceration and Assoiated Structures   . KNEE DEBRIDEMENT     right  . LAPAROTOMY    . LEFT HEART CATHETERIZATION WITH CORONARY ANGIOGRAM N/A 01/16/2013   Procedure: LEFT HEART CATHETERIZATION  WITH CORONARY ANGIOGRAM;  Surgeon: Jolaine Artist, MD;  Location: [facility] CATH LAB;  Service: Cardiovascular;  Laterality: N/A;  . SACROILIAC JOINT FUSION Right 05/02/2019   Procedure: SACROILIAC JOINT FUSION;  Surgeon: Melina Schools, MD;  Location: [place];  Service: Orthopedics;  Laterality: Right;  SACROILIAC JOINT FUSION  . STAPEDECTOMY     right  . TONSILLECTOMY AND ADENOIDECTOMY    . TRIGGER FINGER RELEASE  08/19/2011   Procedure: RELEASE TRIGGER FINGER/A-1 PULLEY;  Surgeon: Willa Frater III;  Location: [facility];  Service: Orthopedics;  Laterality: Right;  right middle finger a-1 pulley release with tenosynovectomy  . TROCHANTERIC BURSA EXCISION Right 02/04/2016  . TUBAL LIGATION    . TYMPANOPLASTY      There were no vitals filed for this visit.    Subjective Assessment - 03/18/20 0947     Subjective PT states that she has been having pain in her RT SI region for over a year.  She ended up getting a fusion of her RT SI on 05/20/2019.  PT states that she was doing well until she moved her mother in law in with her from February until May.  She was boxing up items for months and then moved furniture.   She got to where she could hardlly move, her neck and back hurt.  She was on a walker until about two weeks ago.  She  currently states that her right leg is numb and will collapse on her.  She tries not to lift anything heavy.  She has an appointment with Dr. Jannifer Franklin in July for her numbness.    Pertinent History fusion of SI, anxiety, CAD, Fibromyalgia, back pain, knee pain    Limitations Reading;Sitting;Standing;Walking;House hold activities;Lifting    How long can you sit comfortably? Pt is able to sit for 30 miinutes    How long can you stand comfortably? 20-30 minutes    How long can you walk comfortably? 15 minutes    Diagnostic tests none    Patient Stated Goals to have less pain and have her leg stronger.  To stop falling    Currently in Pain? Yes    Pain Score 5    worst is a 7-8   Pain Location Back    Pain Orientation Right    Pain Descriptors / Indicators Sharp;Shooting    Pain Type Chronic pain    Pain Radiating Towards RT foot to ankle    Pain Onset More than a month ago    Pain Frequency Constant    Aggravating Factors  activity and bending    Pain Relieving Factors sitting in shower and leg the hot water beat on her.    Effect of Pain on Daily Activities limits              [facility] PT Assessment - 03/18/20 0001      Assessment   Medical Diagnosis SI pain     Referring Provider (PT) Levy Pupa    Onset Date/Surgical Date 05/20/19    Prior Therapy yes       Precautions   Precautions None      Restrictions   Weight Bearing Restrictions No      Balance Screen   Has the patient fallen in the past 6 months Yes    How many times? 12    Has the patient  had a decrease in activity level because of a fear of falling?  Yes    Is the patient  reluctant to leave their home because of a fear of falling?  No      Home Ecologist residence      Prior Function   Level of Independence Independent      Cognition   Overall Cognitive Status Within Functional Limits for tasks assessed      Observation/Other Assessments   Focus on Therapeutic Outcomes (FOTO)  49; 51% affected       Functional Tests   Functional tests Single leg stance;Sit to Stand      Single Leg Stance   Comments Lt : 3 seconds; Rt :1       Sit to Stand   Comments 6 in 30 seconds       Posture/Postural Control   Posture/Postural Control Postural limitations    Postural Limitations Decreased lumbar lordosis;Decreased thoracic kyphosis      ROM / Strength   AROM / PROM / Strength AROM;Strength      AROM   AROM Assessment Site Lumbar    Lumbar Flexion finger tips just past knees with increased pain going down.     Lumbar Extension 24   extension increases pain.      Strength   Strength Assessment Site Hip;Knee;Ankle    Right/Left Hip Right;Left    Right Hip Flexion 3+/5    Right Hip Extension 3-/5    Right Hip ABduction 3+/5   give way release    Left Hip Flexion 5/5    Left Hip Extension 4+/5    Left Hip ABduction 5/5    Right/Left Knee Right;Left    Right Knee Flexion 3/5    Right Knee Extension 3+/5   give way release    Left Knee Flexion 5/5    Right/Left Ankle Right;Left    Right Ankle Dorsiflexion 3+/5    Left Ankle Dorsiflexion 5/5                      Objective measurements completed on examination: See above findings.       [facility] PT Treatment/Exercise - 03/18/20 0001      Exercises   Exercises Knee/Hip      Knee/Hip Exercises: Seated   Long Arc Quad Right;10 reps    Other Seated Knee/Hip Exercises Ankle dorsi/plantar x 10      Knee/Hip Exercises: Supine   Bridges Both;5 reps    Straight Leg  Raises Right;5 reps                    PT Short Term Goals - 03/18/20 1035      PT SHORT TERM GOAL #1   Title PT muscle strength in Rt LE to be increased to a 4/5 to allow pt to be able to come sit to stand without use of UE>    Time 3    Period Weeks    Status New    Target Date 04/08/20      PT SHORT TERM GOAL #2   Title PT to be able to single leg stance for at least 10 seconds to allow decrease number of falls; currently falling 2x /week    Time 3    Period Weeks    Status New      PT SHORT TERM GOAL #3   Title PT to be completing her HEP to allow the above to occure.    Time 3    Period Weeks    Status New  PT SHORT TERM GOAL #4   Title Pt pain level to decrease to no greater than a 5/10 to allow pt to sit for an hour at a time for traveling/eating.    Time 3    Period Weeks    Status New             PT Long Term Goals - 03/18/20 1040      PT LONG TERM GOAL #1   Title PT mm strength in her core and Rt LE  to be at least 4+/5 to be able to get up from a squatted position    Time 6    Period Weeks    Status New    Target Date 04/29/20      PT LONG TERM GOAL #2   Title PT to be able to single leg stance on each leg for at least 20 seconds to allow pt to have not fallen in the past 2 weeks.    Time 6    Period Weeks    Status New      PT LONG TERM GOAL #3   Title PT to be I in an advance HEP to allow the above to occur    Time 6    Period Weeks    Status New      PT LONG TERM GOAL #4   Title PT pain in her low back and Rt leg to be no greater than a 3/10 to allow pt to sit for an hour and a half for traveling/ watching television.    Time 6    Period Weeks    Status New      PT LONG TERM GOAL #5   Title PT to be able to walk for 30 minutes without fear of her right leg going out for shopping and housework dutiesn    Time 6    Period Weeks    Status New      Additional Long Term Goals   Additional Long Term Goals Yes      PT LONG  TERM GOAL #6   Title PT to be able to stand for 30 mintues in order to make a meal    Time 6    Period Weeks    Status New                  Plan - 03/18/20 1049    Clinical Impression Statement Ms. O/Brien is a 69 yo female who has had a hx of a right SI fusion on 05/20/2019 due to chronic pain.  She was doing well until this year when she had to box up and move her mother in law.  Now she is experiencing constant numbness and weakness in her Rt Leg.  She has fallen approximately twice every week since her pain began.  She went to the surgery practice where she had her fusion and they have referred her to skilled PT.  Evaluation demonstrates increased pain, decreased ROM, Decreased balance and decreased strength.  Mr. [name] will benefit from skilled PT to address these issues and maximize her functional activity level.    Examination-Activity Limitations Caring for Others;Carry;Dressing;Lift;Locomotion Level;Sit;Squat;Stairs;Stand;Toileting    Examination-Participation Restrictions Cleaning;Community Activity;Laundry;Meal Prep;Shop;Yard Work    Stability/Clinical Decision Making Stable/Uncomplicated    Clinical Decision Making Low    Rehab Potential Good    PT Frequency 2x / week    PT Duration 6 weeks    PT Treatment/Interventions Manual techniques;Patient/family education;Stair training;Gait training;Functional  mobility training;Balance training;Therapeutic exercise;Therapeutic activities    PT Next Visit Plan Begin clam, sidelying abduction; prone hip extension and knee flexion give for HEP, heel raises and functional squat, progress strength and balance as able    PT Home Exercise Plan LAQ, ankle pumps while sitting, SLR, bridge.           Patient will benefit from skilled therapeutic intervention in order to improve the following deficits and impairments:  Decreased activity tolerance, Decreased balance, Decreased mobility, Decreased range of motion, Difficulty walking,  Decreased strength, Pain  Visit Diagnosis: Radiculopathy, lumbosacral region - Plan: PT plan of care cert/re-cert     Problem List Patient Active Problem List   Diagnosis Date Noted  . Microvascular angina ([place]) 01/01/2019  . Unstable angina ([place])   . Common migraine with intractable migraine 06/16/2017  . Tremor 11/16/2016  . Trochanteric bursitis of left hip 02/04/2016  . Trochanteric bursitis of both hips 10/31/2015  . Chest pain at rest 06/17/2015  . Inferior myocardial infarction ([place]) 06/17/2015  . Sprain of interphalangeal joint of left little finger 02/05/2014  . Pain in joint, hand 02/05/2014  . Midsternal chest pain 06/04/2013  . CAD (coronary artery disease)   . Fibromyalgia   . GERD (gastroesophageal reflux disease)   . Migraine headache   . Acute non-STEMI < 8 weeks prior, subsequent admission after initial 01/16/2013  . Radial nerve laceration 03/27/2012  . Lack of coordination 03/27/2012  . Muscle weakness (generalized) 03/27/2012    Rayetta Humphrey, PT CLT [phone] 03/18/2020, 10:56 AM  [facility] [address] [place], [place], [address] Phone: [phone]   Fax:  [phone]  Name: KEM PARCHER MRN: [id number] Date of Birth: 08-20-1951

## 2020-04-01 DIAGNOSIS — M5441 Lumbago with sciatica, right side: Secondary | ICD-10-CM | POA: Diagnosis not present

## 2020-04-01 DIAGNOSIS — M9905 Segmental and somatic dysfunction of pelvic region: Secondary | ICD-10-CM | POA: Diagnosis not present

## 2020-04-01 DIAGNOSIS — M9903 Segmental and somatic dysfunction of lumbar region: Secondary | ICD-10-CM | POA: Diagnosis not present

## 2020-04-01 DIAGNOSIS — M9902 Segmental and somatic dysfunction of thoracic region: Secondary | ICD-10-CM | POA: Diagnosis not present

## 2020-04-02 ENCOUNTER — Ambulatory Visit (HOSPITAL_COMMUNITY): Payer: Medicare Other | Attending: Chiropractic Medicine

## 2020-04-02 ENCOUNTER — Telehealth (HOSPITAL_COMMUNITY): Payer: Self-pay

## 2020-04-02 DIAGNOSIS — M5417 Radiculopathy, lumbosacral region: Secondary | ICD-10-CM | POA: Insufficient documentation

## 2020-04-02 NOTE — Telephone Encounter (Signed)
No Show#1. Therapist called pt regarding missed appointment today at 17. Pt very apologetic, reports she is caring for her mother with Alzheimer's and forgot to call and cancel because the morning has been busy. Therapist wished pt well and educated on next appointment; pt confirmed she will be here on Tuesday.  Talbot Grumbling PT, DPT 04/02/20, 9:23 AM [phone]

## 2020-04-07 ENCOUNTER — Ambulatory Visit (HOSPITAL_COMMUNITY): Payer: Medicare Other

## 2020-04-07 ENCOUNTER — Other Ambulatory Visit: Payer: Self-pay

## 2020-04-07 ENCOUNTER — Encounter (HOSPITAL_COMMUNITY): Payer: Self-pay

## 2020-04-07 DIAGNOSIS — M5417 Radiculopathy, lumbosacral region: Secondary | ICD-10-CM

## 2020-04-07 NOTE — Patient Instructions (Signed)
HIP: Abduction / External Rotation - Side-Lying    Lie on side with hip and knees bent. Raise top knee up, squeezing glutes. Keep ankles together. 10___ reps per set, _1__ sets per day, _7__ days per week   Copyright  VHI. All rights reserved.   Strengthening: Hip Abduction (Side-Lying)    Tighten muscles on front of left thigh, then lift leg 9-12__ inches from surface, keeping knee locked.  Repeat _10-15___ times per set. Do _1___ sets per session. Do _1___ sessions per day.  http://orth.exer.us/623   Copyright  VHI. All rights reserved.   Prone Hip and Knee Extension    Try to lift operated leg, keeping knee as straight as possible. Do not lift or turn hips. Hold 2-3____ seconds. Repeat _10-15___ times. Do _1___ sessions per day.  http://[email or web address].exer.us/309   Copyright  VHI. All rights reserved.   HIP: Extension / KNEE: Flexion - Prone    Bend knee, squeeze glutes. Raise leg up. _10-15__ reps per set, _1__ sets per day, 7___ days per week   Copyright  VHI. All rights reserved.   Toe / Heel Raise (Standing)    Standing with support, raise heels, then rock back on heels and raise toes. Repeat 10-15____ times.  Copyright  VHI. All rights reserved.

## 2020-04-07 NOTE — Therapy (Signed)
[facility], [name], [address] Phone: [phone]   Fax:  [phone]  Physical Therapy Treatment  Patient Details  Name: Stacey Lawrence MRN: [id number] Date of Birth: 04/17/1951 Referring Provider (PT): Levy Pupa   Encounter Date: 04/07/2020   PT End of Session - 04/07/20 1542    Visit Number 2    Number of Visits 12    Date for PT Re-Evaluation 04/29/20    Authorization Type medicare    Progress Note Due on Visit 10    PT Start Time 1414    PT Stop Time 1447    PT Time Calculation (min) 33 min    Activity Tolerance Patient tolerated treatment well    Behavior During Therapy [facility] for tasks assessed/performed           Past Medical History:  Diagnosis Date  . Anemia    hx  . Anxiety   . CAD (coronary artery disease)    a. 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 05/2013 Lexi CL: EF 77%, old small inferolat scar, no ischemia.  . Common migraine with intractable migraine 06/16/2017  . Depression   . Fibromyalgia   . GERD (gastroesophageal reflux disease)    hasn't started taking any meds  . History of blood transfusion    80yrs ago  . History of kidney stones   . Hx of colonic polyps   . IBS (irritable bowel syndrome)    constipation  . Joint pain   . Joint swelling   . Migraine    "q other week" (02/04/2016)  . Myocardial infarction ([place])   . Numbness    right leg  . Polycythemia   . PONV (postoperative nausea and vomiting)   . Raynaud's disease   . Scoliosis   . Shortness of breath dyspnea    with exertion  . SI (sacroiliac) joint dysfunction    right  . Wears glasses     Past Surgical History:  Procedure Laterality Date  . ABDOMINAL  HYSTERECTOMY     partial  . APPENDECTOMY     with explor. lap.  Marland Kitchen CARDIAC CATHETERIZATION    . CHOLECYSTECTOMY  10/28/2002   lap.  . COLONOSCOPY    . DEBRIDEMENT TENNIS ELBOW    . DIAGNOSTIC LAPAROSCOPY    . DILATION AND CURETTAGE OF UTERUS    . ELBOW SURGERY     golfer's elbow-bilateral  . ESOPHAGOGASTRODUODENOSCOPY  09/10/2012   Procedure: ESOPHAGOGASTRODUODENOSCOPY (EGD);  Surgeon: Rogene Houston, MD;  [facility];  Service: Endoscopy;  Laterality: N/A;  730  . EXCISION/RELEASE BURSA HIP Left 02/04/2016   Procedure: LEFT HIP TROCHANTERIC BURSECTOMY;  Surgeon: Mcarthur Rossetti, MD;  Location: [place];  Service: Orthopedics;  Laterality: Left;  . I & D EXTREMITY  02/25/2012   Procedure: IRRIGATION AND DEBRIDEMENT EXTREMITY;  Surgeon: Roseanne Kaufman, MD;  Location: [place];  Service: Orthopedics;  Laterality: Right;  Irrigation and Debridement and Repair Right Thumb Nerve Laceration and Assoiated Structures   . KNEE DEBRIDEMENT     right  . LAPAROTOMY    . LEFT HEART CATHETERIZATION WITH CORONARY ANGIOGRAM N/A 01/16/2013   Procedure: LEFT HEART CATHETERIZATION  WITH CORONARY ANGIOGRAM;  Surgeon: Jolaine Artist, MD;  Location: [facility] CATH LAB;  Service: Cardiovascular;  Laterality: N/A;  . SACROILIAC JOINT FUSION Right 05/02/2019   Procedure: SACROILIAC JOINT FUSION;  Surgeon: Melina Schools, MD;  Location: [place];  Service: Orthopedics;  Laterality: Right;  SACROILIAC JOINT FUSION  . STAPEDECTOMY     right  . TONSILLECTOMY AND ADENOIDECTOMY    . TRIGGER FINGER RELEASE  08/19/2011   Procedure: RELEASE TRIGGER FINGER/A-1 PULLEY;  Surgeon: Willa Frater III;  Location: [place];  Service: Orthopedics;  Laterality: Right;  right middle finger a-1 pulley release with tenosynovectomy  . TROCHANTERIC BURSA EXCISION Right 02/04/2016  . TUBAL LIGATION    . TYMPANOPLASTY      There were no vitals filed for this visit.   Subjective Assessment - 04/07/20 1429     Subjective Patient reports continues to perform HEP and caring for her mother-in-law. Has been going to chiropractor which has helped with pain. Needs PT for strengthening right leg and core.    Pertinent History fusion of SI, anxiety, CAD, Fibromyalgia, back pain, knee pain    Limitations Reading;Sitting;Standing;Walking;House hold activities;Lifting    How long can you sit comfortably? Pt is able to sit for 30 miinutes    How long can you stand comfortably? 20-30 minutes    How long can you walk comfortably? 15 minutes    Diagnostic tests none    Patient Stated Goals to have less pain and have her leg stronger.  To stop falling    Currently in Pain? Yes    Pain Score 4     Pain Location Hip    Pain Orientation Right;Posterior;Lateral;Proximal    Pain Descriptors / Indicators Sharp    Pain Onset More than a month ago             [facility] Adult PT Treatment/Exercise - 04/07/20 0001      Knee/Hip Exercises: Standing   Heel Raises Both;1 set;10 reps    Heel Raises Limitations toe raises also    Functional Squat 1 set;10 reps      Knee/Hip Exercises: Seated   Long Arc Quad Right;10 reps    Long Arc Quad Limitations 10 sec hold    Other Seated Knee/Hip Exercises Ankle dorsi/plantar x 10      Knee/Hip Exercises: Supine   Bridges Strengthening;Both;1 set;10 reps    Bridges Limitations 10 sec hold    Straight Leg Raises Strengthening;Right;1 set;10 reps    Straight Leg Raises Limitations 5 sec hold      Knee/Hip Exercises: Sidelying   Hip ABduction Strengthening;Both;1 set;10 reps    Clams x10 L and R      Knee/Hip Exercises: Prone   Hamstring Curl 1 set;10 reps;2 seconds    Hamstring Curl Limitations both    Hip Extension Strengthening;Both;1 set;10 reps             PT Education - 04/07/20 1541    Education Details Discussed purpose and technique of interventions throughout session. Advanced HEP as patient has difficulty attending PT sessions due to caregiving duties.     Person(s) Educated Patient    Methods Explanation;Demonstration    Comprehension Verbalized understanding;Returned demonstration            PT Short Term Goals - 04/07/20 1548      PT SHORT TERM GOAL #1   Title PT muscle strength in Rt LE to be increased to a 4/5 to allow pt to be  able to come sit to stand without use of UE>    Time 3    Period Weeks    Status On-going    Target Date 04/08/20      PT SHORT TERM GOAL #2   Title PT to be able to single leg stance for at least 10 seconds to allow decrease number of falls; currently falling 2x /week    Time 3    Period Weeks    Status On-going      PT SHORT TERM GOAL #3   Title PT to be completing her HEP to allow the above to occure.    Time 3    Period Weeks    Status Achieved      PT SHORT TERM GOAL #4   Title Pt pain level to decrease to no greater than a 5/10 to allow pt to sit for an hour at a time for traveling/eating.    Time 3    Period Weeks    Status On-going             PT Long Term Goals - 04/07/20 1549      PT LONG TERM GOAL #1   Title PT mm strength in her core and Rt LE  to be at least 4+/5 to be able to get up from a squatted position    Time 6    Period Weeks    Status On-going      PT LONG TERM GOAL #2   Title PT to be able to single leg stance on each leg for at least 20 seconds to allow pt to have not fallen in the past 2 weeks.    Time 6    Period Weeks    Status On-going      PT LONG TERM GOAL #3   Title PT to be I in an advance HEP to allow the above to occur    Time 6    Period Weeks    Status On-going      PT LONG TERM GOAL #4   Title PT pain in her low back and Rt leg to be no greater than a 3/10 to allow pt to sit for an hour and a half for traveling/ watching television.    Time 6    Period Weeks    Status On-going      PT LONG TERM GOAL #5   Title PT to be able to walk for 30 minutes without fear of her right leg going out for shopping and housework dutiesn    Time 6     Period Weeks    Status On-going      PT LONG TERM GOAL #6   Title PT to be able to stand for 30 mintues in order to make a meal    Time 6    Period Weeks    Status On-going             Plan - 04/07/20 1546    Clinical Impression Statement Patient tolerated treatment well today. Patient was 14 minutes late for her appointment as she was delayed by her mother-in-law she is caring for. Patient reports compliance with HEP regularly and has no questions regarding exercises given her first visit. Review eval, goals and HEP. Performed therapeutic exercises. Advanced HEP to include sidelying clams and abduction, prone hip extension and knee flexion, as well as, standing heel/toe raises. Patient without increased pain complaints at end of session. Continue with current plan.  Progress as able. Advance HEP as able as patient finds it difficult to attend in person PT session due to caregiving responsibilities and complications.    Examination-Activity Limitations Caring for Others;Carry;Dressing;Lift;Locomotion Level;Sit;Squat;Stairs;Stand;Toileting    Examination-Participation Restrictions Cleaning;Community Activity;Laundry;Meal Prep;Shop;Yard Work    Stability/Clinical Decision Making Stable/Uncomplicated    Rehab Potential Good    PT Frequency 2x / week    PT Duration 6 weeks    PT Treatment/Interventions Manual techniques;Patient/family education;Stair training;Gait training;Functional mobility training;Balance training;Therapeutic exercise;Therapeutic activities    PT Next Visit Plan Continue core and LE strengthening exercises; begin balance exercises as able; advance HEP as able.    PT Home Exercise Plan LAQ, ankle pumps while sitting, SLR, bridge.; 04/07/20 - clam, sidelying abduction; prone hip extension and knee flexion, standing heel/toe raise    Consulted and Agree with Plan of Care Patient           Patient will benefit from skilled therapeutic intervention in order to improve the  following deficits and impairments:  Decreased activity tolerance, Decreased balance, Decreased mobility, Decreased range of motion, Difficulty walking, Decreased strength, Pain  Visit Diagnosis: Radiculopathy, lumbosacral region     Problem List Patient Active Problem List   Diagnosis Date Noted  . Microvascular angina ([place]) 01/01/2019  . Unstable angina ([place])   . Common migraine with intractable migraine 06/16/2017  . Tremor 11/16/2016  . Trochanteric bursitis of left hip 02/04/2016  . Trochanteric bursitis of both hips 10/31/2015  . Chest pain at rest 06/17/2015  . Inferior myocardial infarction ([place]) 06/17/2015  . Sprain of interphalangeal joint of left little finger 02/05/2014  . Pain in joint, hand 02/05/2014  . Midsternal chest pain 06/04/2013  . CAD (coronary artery disease)   . Fibromyalgia   . GERD (gastroesophageal reflux disease)   . Migraine headache   . Acute non-STEMI < 8 weeks prior, subsequent admission after initial 01/16/2013  . Radial nerve laceration 03/27/2012  . Lack of coordination 03/27/2012  . Muscle weakness (generalized) 03/27/2012    Floria Raveling. Hartnett-Rands, MS, PT Per [place] #[id number] 04/07/2020, 3:50 PM  [facility] [address] [place], [name], [address] Phone: [phone]   Fax:  [phone]  Name: Stacey Lawrence MRN: [id number] Date of Birth: [birth year]

## 2020-04-09 ENCOUNTER — Other Ambulatory Visit: Payer: Self-pay

## 2020-04-09 ENCOUNTER — Encounter (HOSPITAL_COMMUNITY): Payer: Self-pay

## 2020-04-09 ENCOUNTER — Ambulatory Visit (HOSPITAL_COMMUNITY): Payer: Medicare Other

## 2020-04-09 ENCOUNTER — Ambulatory Visit (INDEPENDENT_AMBULATORY_CARE_PROVIDER_SITE_OTHER): Payer: Medicare Other | Admitting: Psychiatry

## 2020-04-09 DIAGNOSIS — M5417 Radiculopathy, lumbosacral region: Secondary | ICD-10-CM

## 2020-04-09 DIAGNOSIS — F330 Major depressive disorder, recurrent, mild: Secondary | ICD-10-CM | POA: Diagnosis not present

## 2020-04-09 NOTE — Therapy (Signed)
[place], [name], [address] Phone: [phone]   Fax:  [phone]  Physical Therapy Treatment  Patient Details  Name: Stacey Lawrence MRN: [id number] Date of Birth: 1951/01/10 Referring Provider (PT): Levy Pupa   Encounter Date: 04/09/2020   PT End of Session - 04/09/20 1535    Visit Number 3    Number of Visits 12    Date for PT Re-Evaluation 04/29/20    Authorization Type medicare    Progress Note Due on Visit 10    PT Start Time 7672    PT Stop Time 1529    PT Time Calculation (min) 42 min    Activity Tolerance Patient tolerated treatment well    Behavior During Therapy [facility] for tasks assessed/performed           Past Medical History:  Diagnosis Date  . Anemia    hx  . Anxiety   . CAD (coronary artery disease)    a. 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 05/2013 Lexi CL: EF 77%, old small inferolat scar, no ischemia.  . Common migraine with intractable migraine 06/16/2017  . Depression   . Fibromyalgia   . GERD (gastroesophageal reflux disease)    hasn't started taking any meds  . History of blood transfusion    8yrs ago  . History of kidney stones   . Hx of colonic polyps   . IBS (irritable bowel syndrome)    constipation  . Joint pain   . Joint swelling   . Migraine    "q other week" (02/04/2016)  . Myocardial infarction ([place])   . Numbness    right leg  . Polycythemia   . PONV (postoperative nausea and vomiting)   . Raynaud's disease   . Scoliosis   . Shortness of breath dyspnea    with exertion  . SI (sacroiliac) joint dysfunction    right  . Wears glasses     Past Surgical History:  Procedure Laterality Date  . ABDOMINAL  HYSTERECTOMY     partial  . APPENDECTOMY     with explor. lap.  Marland Kitchen CARDIAC CATHETERIZATION    . CHOLECYSTECTOMY  10/28/2002   lap.  . COLONOSCOPY    . DEBRIDEMENT TENNIS ELBOW    . DIAGNOSTIC LAPAROSCOPY    . DILATION AND CURETTAGE OF UTERUS    . ELBOW SURGERY     golfer's elbow-bilateral  . ESOPHAGOGASTRODUODENOSCOPY  09/10/2012   Procedure: ESOPHAGOGASTRODUODENOSCOPY (EGD);  Surgeon: Rogene Houston, MD;  [facility];  Service: Endoscopy;  Laterality: N/A;  730  . EXCISION/RELEASE BURSA HIP Left 02/04/2016   Procedure: LEFT HIP TROCHANTERIC BURSECTOMY;  Surgeon: Mcarthur Rossetti, MD;  Location: [place];  Service: Orthopedics;  Laterality: Left;  . I & D EXTREMITY  02/25/2012   Procedure: IRRIGATION AND DEBRIDEMENT EXTREMITY;  Surgeon: Roseanne Kaufman, MD;  Location: [place];  Service: Orthopedics;  Laterality: Right;  Irrigation and Debridement and Repair Right Thumb Nerve Laceration and Assoiated Structures   . KNEE DEBRIDEMENT     right  . LAPAROTOMY    . LEFT HEART CATHETERIZATION WITH CORONARY ANGIOGRAM N/A 01/16/2013   Procedure: LEFT HEART CATHETERIZATION  WITH CORONARY ANGIOGRAM;  Surgeon: Jolaine Artist, MD;  Location: [facility] CATH LAB;  Service: Cardiovascular;  Laterality: N/A;  . SACROILIAC JOINT FUSION Right 05/02/2019   Procedure: SACROILIAC JOINT FUSION;  Surgeon: Melina Schools, MD;  Location: [place];  Service: Orthopedics;  Laterality: Right;  SACROILIAC JOINT FUSION  . STAPEDECTOMY     right  . TONSILLECTOMY AND ADENOIDECTOMY    . TRIGGER FINGER RELEASE  08/19/2011   Procedure: RELEASE TRIGGER FINGER/A-1 PULLEY;  Surgeon: Willa Frater III;  Location: [place];  Service: Orthopedics;  Laterality: Right;  right middle finger a-1 pulley release with tenosynovectomy  . TROCHANTERIC BURSA EXCISION Right 02/04/2016  . TUBAL LIGATION    . TYMPANOPLASTY      There were no vitals filed for this visit.   Subjective Assessment - 04/09/20 1441     Subjective Some right leg soreness but no pain. Performed HEP yesterday.    Pertinent History fusion of SI, anxiety, CAD, Fibromyalgia, back pain, knee pain    Limitations Reading;Sitting;Standing;Walking;House hold activities;Lifting    How long can you sit comfortably? Pt is able to sit for 30 miinutes    How long can you stand comfortably? 20-30 minutes    How long can you walk comfortably? 15 minutes    Diagnostic tests none    Patient Stated Goals to have less pain and have her leg stronger.  To stop falling    Pain Onset More than a month ago             [facility] Adult PT Treatment/Exercise - 04/09/20 0001      Knee/Hip Exercises: Standing   Heel Raises Both;1 set;10 reps    Heel Raises Limitations toe raises also    Forward Step Up Both;10 reps;Step Height: 4"    Functional Squat 1 set;10 reps    Functional Squat Limitations to chair for mechani s    SLS max of 3 - Rt11 sec; Left 7 sec    SLS with Vectors 5 RT; cone taps 3 5RT    Other Standing Knee Exercises tandem on foam 30"x2 B    Other Standing Knee Exercises --      Knee/Hip Exercises: Seated   Long Arc Quad Right;5 reps    Long Arc Quad Limitations 5 sec hold    Other Seated Knee/Hip Exercises Ankle dorsi/plantar x5      Knee/Hip Exercises: Supine   Bridges Strengthening;Both;1 set;10 reps    Bridges Limitations 5 sec hold; GTB around thigh    Bridges with [employer] Strengthening;Both;1 set;10 reps   5 sec hold   Straight Leg Raises Strengthening;Right;1 set;5 reps    Straight Leg Raises Limitations 5 sec hold      Knee/Hip Exercises: Sidelying   Hip ABduction Strengthening;Both;1 set;10 reps    Hip ABduction Limitations c/ RTB around knees    Clams x10 L and R c/ RTB around knees      Knee/Hip Exercises: Prone   Hamstring Curl 1 set;10 reps    Hamstring Curl Limitations R only; 2# ankle weight    Hip Extension Strengthening;Both;1 set;10 reps             PT Education - 04/09/20 1534    Education  Details Discussed purpose and technique of interventions throughout session. Reciewed  HEP.    Person(s) Educated Patient    Methods Explanation;Demonstration    Comprehension Verbalized understanding;Returned demonstration            PT  Short Term Goals - 04/07/20 1548      PT SHORT TERM GOAL #1   Title PT muscle strength in Rt LE to be increased to a 4/5 to allow pt to be able to come sit to stand without use of UE>    Time 3    Period Weeks    Status On-going    Target Date 04/08/20      PT SHORT TERM GOAL #2   Title PT to be able to single leg stance for at least 10 seconds to allow decrease number of falls; currently falling 2x /week    Time 3    Period Weeks    Status On-going      PT SHORT TERM GOAL #3   Title PT to be completing her HEP to allow the above to occure.    Time 3    Period Weeks    Status Achieved      PT SHORT TERM GOAL #4   Title Pt pain level to decrease to no greater than a 5/10 to allow pt to sit for an hour at a time for traveling/eating.    Time 3    Period Weeks    Status On-going             PT Long Term Goals - 04/07/20 1549      PT LONG TERM GOAL #1   Title PT mm strength in her core and Rt LE  to be at least 4+/5 to be able to get up from a squatted position    Time 6    Period Weeks    Status On-going      PT LONG TERM GOAL #2   Title PT to be able to single leg stance on each leg for at least 20 seconds to allow pt to have not fallen in the past 2 weeks.    Time 6    Period Weeks    Status On-going      PT LONG TERM GOAL #3   Title PT to be I in an advance HEP to allow the above to occur    Time 6    Period Weeks    Status On-going      PT LONG TERM GOAL #4   Title PT pain in her low back and Rt leg to be no greater than a 3/10 to allow pt to sit for an hour and a half for traveling/ watching television.    Time 6    Period Weeks    Status On-going      PT LONG TERM GOAL #5   Title PT to be able to walk for 30  minutes without fear of her right leg going out for shopping and housework dutiesn    Time 6    Period Weeks    Status On-going      PT LONG TERM GOAL #6   Title PT to be able to stand for 30 mintues in order to make a meal    Time 6    Period Weeks    Status On-going            Plan - 04/09/20 1536    Clinical Impression Statement Patient tolerated treatment well today. Reviewed previously given HEP and advanced with resistance bands/ankle weights as able. Patient performed well. Advanced ther ex to increase strength and balance. Patient exhibited fatigue in both lower extremities at end of session but no complaints of pain. Continue with  current plan. Progress strength, balance and HEP as able.    Examination-Activity Limitations Caring for Others;Carry;Dressing;Lift;Locomotion Level;Sit;Squat;Stairs;Stand;Toileting    Examination-Participation Restrictions Cleaning;Community Activity;Laundry;Meal Prep;Shop;Yard Work    Stability/Clinical Decision Making Stable/Uncomplicated    Rehab Potential Good    PT Frequency 2x / week    PT Duration 6 weeks    PT Treatment/Interventions Manual techniques;Patient/family education;Stair training;Gait training;Functional mobility training;Balance training;Therapeutic exercise;Therapeutic activities    PT Next Visit Plan Continue core and LE strengthening exercises; progress strengthening, core and  balance exercises as able; advance HEP as able.    PT Home Exercise Plan LAQ, ankle pumps while sitting, SLR, bridge.; 04/07/20 - clam, sidelying abduction; prone hip extension and knee flexion, standing heel/toe raise    Consulted and Agree with Plan of Care Patient           Patient will benefit from skilled therapeutic intervention in order to improve the following deficits and impairments:  Decreased activity tolerance, Decreased balance, Decreased mobility, Decreased range of motion, Difficulty walking, Decreased strength, Pain  Visit  Diagnosis: Radiculopathy, lumbosacral region     Problem List Patient Active Problem List   Diagnosis Date Noted  . Microvascular angina ([place]) 01/01/2019  . Unstable angina ([place])   . Common migraine with intractable migraine 06/16/2017  . Tremor 11/16/2016  . Trochanteric bursitis of left hip 02/04/2016  . Trochanteric bursitis of both hips 10/31/2015  . Chest pain at rest 06/17/2015  . Inferior myocardial infarction ([place]) 06/17/2015  . Sprain of interphalangeal joint of left little finger 02/05/2014  . Pain in joint, hand 02/05/2014  . Midsternal chest pain 06/04/2013  . CAD (coronary artery disease)   . Fibromyalgia   . GERD (gastroesophageal reflux disease)   . Migraine headache   . Acute non-STEMI < 8 weeks prior, subsequent admission after initial 01/16/2013  . Radial nerve laceration 03/27/2012  . Lack of coordination 03/27/2012  . Muscle weakness (generalized) 03/27/2012    Floria Raveling. Hartnett-Rands, MS, PT Per [place] #[id number] 04/09/2020, 3:48 PM  [facility] [address] [place], [name], [address] Phone: [phone]   Fax:  [phone]  Name: Stacey Lawrence MRN: [id number] Date of Birth: 05-Oct-1950
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
## 2020-04-14 ENCOUNTER — Ambulatory Visit (HOSPITAL_COMMUNITY): Payer: Medicare Other | Admitting: Physical Therapy

## 2020-04-14 ENCOUNTER — Encounter (HOSPITAL_COMMUNITY): Payer: Self-pay | Admitting: Physical Therapy

## 2020-04-14 ENCOUNTER — Other Ambulatory Visit: Payer: Self-pay

## 2020-04-14 DIAGNOSIS — M5417 Radiculopathy, lumbosacral region: Secondary | ICD-10-CM

## 2020-04-14 NOTE — Therapy (Signed)
[place], [name], [address] Phone: [phone]   Fax:  [phone]  Physical Therapy Treatment  Patient Details  Name: Stacey Lawrence MRN: [id number] Date of Birth: [birth year] Referring Provider (PT): Levy Pupa   Encounter Date: 04/14/2020   PT End of Session - 04/14/20 1556    Visit Number 4    Number of Visits 12    Date for PT Re-Evaluation 04/29/20    Authorization Type medicare    Progress Note Due on Visit 10    PT Start Time 1446    PT Stop Time 1528    PT Time Calculation (min) 42 min    Activity Tolerance Patient tolerated treatment well    Behavior During Therapy [facility] for tasks assessed/performed           Past Medical History:  Diagnosis Date   Anemia    hx   Anxiety    CAD (coronary artery disease)    a. 12/2012 NSTEMI/Cath: LM anomalous, arising in R cusp ant to RCA, otw nl, LAD 41m with bridge, RI nl, LCX nl, OM2 small, subtl occl w/ thrombus distally - felt to be spont dissection, too small for PCI->Med Rx, RCA large, dom, nl, PDA/PD nl, EF 55% w/ focal HK in mid-dist antlat wall;  b. 05/2013 Lexi CL: EF 77%, old small inferolat scar, no ischemia.   Common migraine with intractable migraine 06/16/2017   Depression    Fibromyalgia    GERD (gastroesophageal reflux disease)    hasn't started taking any meds   History of blood transfusion    88yrs ago   History of kidney stones    Hx of colonic polyps    IBS (irritable bowel syndrome)    constipation   Joint pain    Joint swelling    Migraine    "q other week" (02/04/2016)   Myocardial infarction (HCC)    Numbness    right leg   Polycythemia    PONV (postoperative nausea and vomiting)    Raynaud's disease    Scoliosis    Shortness of breath dyspnea    with exertion   SI (sacroiliac) joint dysfunction    right   Wears glasses     Past Surgical History:  Procedure Laterality Date   ABDOMINAL  HYSTERECTOMY     partial   APPENDECTOMY     with explor. lap.   CARDIAC CATHETERIZATION     CHOLECYSTECTOMY  10/28/2002   lap.   COLONOSCOPY     DEBRIDEMENT TENNIS ELBOW     DIAGNOSTIC LAPAROSCOPY     DILATION AND CURETTAGE OF UTERUS     ELBOW SURGERY     golfer's elbow-bilateral   ESOPHAGOGASTRODUODENOSCOPY  09/10/2012   Procedure: ESOPHAGOGASTRODUODENOSCOPY (EGD);  Surgeon: Rogene Houston, MD;  [facility];  Service: Endoscopy;  Laterality: N/A;  730   EXCISION/RELEASE BURSA HIP Left 02/04/2016   Procedure: LEFT HIP TROCHANTERIC BURSECTOMY;  Surgeon: Mcarthur Rossetti, MD;  Location: [place];  Service: Orthopedics;  Laterality: Left;   I & D EXTREMITY  02/25/2012   Procedure: IRRIGATION AND DEBRIDEMENT EXTREMITY;  Surgeon: Roseanne Kaufman, MD;  Location: [place];  Service: Orthopedics;  Laterality: Right;  Irrigation and Debridement and Repair Right Thumb Nerve Laceration and Assoiated Structures    KNEE DEBRIDEMENT     right   LAPAROTOMY     LEFT HEART CATHETERIZATION WITH CORONARY ANGIOGRAM N/A 01/16/2013   Procedure: LEFT HEART CATHETERIZATION  WITH CORONARY ANGIOGRAM;  Surgeon: Jolaine Artist, MD;  Location: [facility] CATH LAB;  Service: Cardiovascular;  Laterality: N/A;   SACROILIAC JOINT FUSION Right 05/02/2019   Procedure: SACROILIAC JOINT FUSION;  Surgeon: Melina Schools, MD;  Location: [place];  Service: Orthopedics;  Laterality: Right;  SACROILIAC JOINT FUSION   STAPEDECTOMY     right   TONSILLECTOMY AND ADENOIDECTOMY     TRIGGER FINGER RELEASE  08/19/2011   Procedure: RELEASE TRIGGER FINGER/A-1 PULLEY;  Surgeon: Willa Frater III;  Location: [place];  Service: Orthopedics;  Laterality: Right;  right middle finger a-1 pulley release with tenosynovectomy   TROCHANTERIC BURSA EXCISION Right 02/04/2016   TUBAL LIGATION     TYMPANOPLASTY      There were no vitals filed for this visit.   Subjective Assessment - 04/14/20 1450     Subjective Reports weakness is about the same, states pain is better then before and no difficulties with exercises    Pertinent History fusion of SI, anxiety, CAD, Fibromyalgia, back pain, knee pain    Limitations Reading;Sitting;Standing;Walking;House hold activities;Lifting    How long can you sit comfortably? Pt is able to sit for 30 miinutes    How long can you stand comfortably? 20-30 minutes    How long can you walk comfortably? 15 minutes    Diagnostic tests none    Patient Stated Goals to have less pain and have her leg stronger.  To stop falling    Currently in Pain? Yes    Pain Score 4     Pain Location Hip    Pain Orientation Right;Posterior;Lateral    Pain Descriptors / Indicators Aching    Pain Onset More than a month ago              [facility] PT Assessment - 04/14/20 0001      Assessment   Medical Diagnosis SI pain     Referring Provider (PT) Levy Pupa                         [facility] Adult PT Treatment/Exercise - 04/14/20 0001      Knee/Hip Exercises: Standing   Heel Raises Both;3 sets;5 reps;5 seconds   slow lower   Forward Step Up Step Height: 6";Hand Hold: 1;20 reps;Both    Step Down Hand Hold: 1;Step Height: 6";20 reps;Both    SLS max without support on floor - Rt 7 sec; Left 8sec, then 3x30" B with occ. UE one hand support     Other Standing Knee Exercises Crossover stepups 6" step and UE 5x5 B 6" step    Other Standing Knee Exercises chair pose 2x3 20"                   PT Education - 04/14/20 1553    Education Details in HEP, focus of exercises and how to perform    Person(s) Educated Patient    Methods Explanation    Comprehension Verbalized understanding            PT Short Term Goals - 04/07/20 1548      PT SHORT TERM GOAL #1   Title PT muscle strength in Rt LE to be increased to a 4/5 to allow pt to be able to come sit to stand without use of UE>    Time 3    Period Weeks    Status On-going    Target Date 04/08/20  PT SHORT TERM GOAL #2   Title PT to be able to single leg stance for at least 10 seconds to allow decrease number of falls; currently falling 2x /week    Time 3    Period Weeks    Status On-going      PT SHORT TERM GOAL #3   Title PT to be completing her HEP to allow the above to occure.    Time 3    Period Weeks    Status Achieved      PT SHORT TERM GOAL #4   Title Pt pain level to decrease to no greater than a 5/10 to allow pt to sit for an hour at a time for traveling/eating.    Time 3    Period Weeks    Status On-going             PT Long Term Goals - 04/07/20 1549      PT LONG TERM GOAL #1   Title PT mm strength in her core and Rt LE  to be at least 4+/5 to be able to get up from a squatted position    Time 6    Period Weeks    Status On-going      PT LONG TERM GOAL #2   Title PT to be able to single leg stance on each leg for at least 20 seconds to allow pt to have not fallen in the past 2 weeks.    Time 6    Period Weeks    Status On-going      PT LONG TERM GOAL #3   Title PT to be I in an advance HEP to allow the above to occur    Time 6    Period Weeks    Status On-going      PT LONG TERM GOAL #4   Title PT pain in her low back and Rt leg to be no greater than a 3/10 to allow pt to sit for an hour and a half for traveling/ watching television.    Time 6    Period Weeks    Status On-going      PT LONG TERM GOAL #5   Title PT to be able to walk for 30 minutes without fear of her right leg going out for shopping and housework dutiesn    Time 6    Period Weeks    Status On-going      PT LONG TERM GOAL #6   Title PT to be able to stand for 30 mintues in order to make a meal    Time 6    Period Weeks    Status On-going                 Plan - 04/14/20 1554    Clinical Impression Statement Focused on developing HEP on this date. Patient tolerated advanced exercises with no reports of pain just weakness in right leg. Initial hesitancy to  perform slow controlled step down with right leg but able to do after practice and one hand support. Added new exercises to HEP.    Examination-Activity Limitations Caring for Others;Carry;Dressing;Lift;Locomotion Level;Sit;Squat;Stairs;Stand;Toileting    Examination-Participation Restrictions Cleaning;Community Activity;Laundry;Meal Prep;Shop;Yard Work    Stability/Clinical Decision Making Stable/Uncomplicated    Rehab Potential Good    PT Frequency 2x / week    PT Duration 6 weeks    PT Treatment/Interventions Manual techniques;Patient/family education;Stair training;Gait training;Functional mobility training;Balance training;Therapeutic exercise;Therapeutic activities    PT Next  Visit Plan Continue core and LE strengthening exercises; progress strengthening, core and  balance exercises as able; advance HEP as able.    PT Home Exercise Plan LAQ, ankle pumps while sitting, SLR, bridge.; 04/07/20 - clam, sidelying abduction; prone hip extension and knee flexion, standing heel/toe raise; 7/27 crossover step up, chair pose, step up/down    Consulted and Agree with Plan of Care Patient           Patient will benefit from skilled therapeutic intervention in order to improve the following deficits and impairments:  Decreased activity tolerance, Decreased balance, Decreased mobility, Decreased range of motion, Difficulty walking, Decreased strength, Pain  Visit Diagnosis: Radiculopathy, lumbosacral region     Problem List Patient Active Problem List   Diagnosis Date Noted   Microvascular angina ([place]) 01/01/2019   Unstable angina (HCC)    Common migraine with intractable migraine 06/16/2017   Tremor 11/16/2016   Trochanteric bursitis of left hip 02/04/2016   Trochanteric bursitis of both hips 10/31/2015   Chest pain at rest 06/17/2015   Inferior myocardial infarction ([place]) 06/17/2015   Sprain of interphalangeal joint of left little finger 02/05/2014   Pain in joint, hand  02/05/2014   Midsternal chest pain 06/04/2013   CAD (coronary artery disease)    Fibromyalgia    GERD (gastroesophageal reflux disease)    Migraine headache    Acute non-STEMI < 8 weeks prior, subsequent admission after initial 01/16/2013   Radial nerve laceration 03/27/2012   Lack of coordination 03/27/2012   Muscle weakness (generalized) 03/27/2012    3:57 PM, 04/14/20 Jerene Pitch, DPT Physical Therapy with [facility]  [phone] office  [place] [address] [place], [name], [address] Phone: [phone]   Fax:  [phone]  Name: Stacey Lawrence MRN: [id number] Date of Birth: 1951/01/04

## 2020-04-14 NOTE — Patient Instructions (Signed)
Access Code: [email or web address] URL: [email or web address] Date: 04/14/2020 Prepared by: Yetta Glassman  Exercises Chair Pose with Feet Together - 2 sets - 3 reps - 20 hold Crossover Step Up - 3 sets - 10 reps Forward Step Down - 2 sets - 10 reps Step Up - 2 sets - 10 reps Heel rises with counter support - 4 sets - 5 reps - 5 hold Heel Toe Raises with Counter Support - 4 sets - 5 reps - 5 hold

## 2020-04-16 ENCOUNTER — Other Ambulatory Visit: Payer: Self-pay

## 2020-04-16 ENCOUNTER — Ambulatory Visit (HOSPITAL_COMMUNITY): Payer: Medicare Other | Admitting: Physical Therapy

## 2020-04-16 ENCOUNTER — Encounter (HOSPITAL_COMMUNITY): Payer: Self-pay | Admitting: Physical Therapy

## 2020-04-16 DIAGNOSIS — M5417 Radiculopathy, lumbosacral region: Secondary | ICD-10-CM | POA: Diagnosis not present

## 2020-04-16 NOTE — Therapy (Signed)
[place], [name], [address] Phone: [phone]   Fax:  [phone]  Physical Therapy Treatment  Patient Details  Name: Stacey Lawrence MRN: [id number] Date of Birth: 1951-04-27 Referring Provider (PT): Levy Pupa   Encounter Date: 04/16/2020   PT End of Session - 04/16/20 1534    Visit Number 5    Number of Visits 12    Date for PT Re-Evaluation 04/29/20    Authorization Type medicare    Progress Note Due on Visit 10    PT Start Time 4315    PT Stop Time 1610    PT Time Calculation (min) 32 min    Activity Tolerance Patient tolerated treatment well    Behavior During Therapy [facility] for tasks assessed/performed           Past Medical History:  Diagnosis Date  . Anemia    hx  . Anxiety   . CAD (coronary artery disease)    a. 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 05/2013 Lexi CL: EF 77%, old small inferolat scar, no ischemia.  . Common migraine with intractable migraine 06/16/2017  . Depression   . Fibromyalgia   . GERD (gastroesophageal reflux disease)    hasn't started taking any meds  . History of blood transfusion    63yrs ago  . History of kidney stones   . Hx of colonic polyps   . IBS (irritable bowel syndrome)    constipation  . Joint pain   . Joint swelling   . Migraine    "q other week" (02/04/2016)  . Myocardial infarction ([place])   . Numbness    right leg  . Polycythemia   . PONV (postoperative nausea and vomiting)   . Raynaud's disease   . Scoliosis   . Shortness of breath dyspnea    with exertion  . SI (sacroiliac) joint dysfunction    right  . Wears glasses     Past Surgical History:  Procedure Laterality Date  . ABDOMINAL  HYSTERECTOMY     partial  . APPENDECTOMY     with explor. lap.  Marland Kitchen CARDIAC CATHETERIZATION    . CHOLECYSTECTOMY  10/28/2002   lap.  . COLONOSCOPY    . DEBRIDEMENT TENNIS ELBOW    . DIAGNOSTIC LAPAROSCOPY    . DILATION AND CURETTAGE OF UTERUS    . ELBOW SURGERY     golfer's elbow-bilateral  . ESOPHAGOGASTRODUODENOSCOPY  09/10/2012   Procedure: ESOPHAGOGASTRODUODENOSCOPY (EGD);  Surgeon: Rogene Houston, MD;  [facility];  Service: Endoscopy;  Laterality: N/A;  730  . EXCISION/RELEASE BURSA HIP Left 02/04/2016   Procedure: LEFT HIP TROCHANTERIC BURSECTOMY;  Surgeon: Mcarthur Rossetti, MD;  Location: [place];  Service: Orthopedics;  Laterality: Left;  . I & D EXTREMITY  02/25/2012   Procedure: IRRIGATION AND DEBRIDEMENT EXTREMITY;  Surgeon: Roseanne Kaufman, MD;  Location: [place];  Service: Orthopedics;  Laterality: Right;  Irrigation and Debridement and Repair Right Thumb Nerve Laceration and Assoiated Structures   . KNEE DEBRIDEMENT     right  . LAPAROTOMY    . LEFT HEART CATHETERIZATION WITH CORONARY ANGIOGRAM N/A 01/16/2013   Procedure: LEFT HEART CATHETERIZATION  WITH CORONARY ANGIOGRAM;  Surgeon: Jolaine Artist, MD;  Location: [facility] CATH LAB;  Service: Cardiovascular;  Laterality: N/A;  . SACROILIAC JOINT FUSION Right 05/02/2019   Procedure: SACROILIAC JOINT FUSION;  Surgeon: Melina Schools, MD;  Location: [place];  Service: Orthopedics;  Laterality: Right;  SACROILIAC JOINT FUSION  . STAPEDECTOMY     right  . TONSILLECTOMY AND ADENOIDECTOMY    . TRIGGER FINGER RELEASE  08/19/2011   Procedure: RELEASE TRIGGER FINGER/A-1 PULLEY;  Surgeon: Willa Frater III;  Location: [place];  Service: Orthopedics;  Laterality: Right;  right middle finger a-1 pulley release with tenosynovectomy  . TROCHANTERIC BURSA EXCISION Right 02/04/2016  . TUBAL LIGATION    . TYMPANOPLASTY      There were no vitals filed for this visit.   Subjective Assessment - 04/16/20 1541     Subjective States she has been doing her exercises and they are going well. States that she has a little pain in the right hip but she is a little sore today.    Pertinent History fusion of SI, anxiety, CAD, Fibromyalgia, back pain, knee pain    Limitations Reading;Sitting;Standing;Walking;House hold activities;Lifting    How long can you sit comfortably? Pt is able to sit for 30 miinutes    How long can you stand comfortably? 20-30 minutes    How long can you walk comfortably? 15 minutes    Diagnostic tests none    Patient Stated Goals to have less pain and have her leg stronger.  To stop falling    Currently in Pain? Yes    Pain Score 4     Pain Location Hip    Pain Orientation Right;Posterior    Pain Descriptors / Indicators Aching    Pain Onset More than a month ago              [facility] PT Assessment - 04/16/20 0001      Assessment   Medical Diagnosis SI pain     Referring Provider (PT) Levy Pupa                         [facility] Adult PT Treatment/Exercise - 04/16/20 0001      Knee/Hip Exercises: Stretches   Piriformis Stretch Right;4 reps;30 seconds    Piriformis Stretch Limitations seated      Knee/Hip Exercises: Standing   SLS 4x30" holds B - occ UE support    Other Standing Knee Exercises tandem on blue foam with 1# barbell - raises 3x10 B     Other Standing Knee Exercises rockerboard lateral 2x10 B slow and controlled      Knee/Hip Exercises: Seated   Sit to Sand 2 sets;15 reps;without UE support   with cushion for support and green belt                    PT Short Term Goals - 04/07/20 1548      PT SHORT TERM GOAL #1   Title PT muscle strength in Rt LE to be increased to a 4/5 to allow pt to be able to come sit to stand without use of UE>    Time 3    Period Weeks    Status On-going    Target Date 04/08/20      PT SHORT TERM GOAL #2   Title PT to be able to single leg stance for at least 10 seconds to allow decrease number of  falls;  currently falling 2x /week    Time 3    Period Weeks    Status On-going      PT SHORT TERM GOAL #3   Title PT to be completing her HEP to allow the above to occure.    Time 3    Period Weeks    Status Achieved      PT SHORT TERM GOAL #4   Title Pt pain level to decrease to no greater than a 5/10 to allow pt to sit for an hour at a time for traveling/eating.    Time 3    Period Weeks    Status On-going             PT Long Term Goals - 04/07/20 1549      PT LONG TERM GOAL #1   Title PT mm strength in her core and Rt LE  to be at least 4+/5 to be able to get up from a squatted position    Time 6    Period Weeks    Status On-going      PT LONG TERM GOAL #2   Title PT to be able to single leg stance on each leg for at least 20 seconds to allow pt to have not fallen in the past 2 weeks.    Time 6    Period Weeks    Status On-going      PT LONG TERM GOAL #3   Title PT to be I in an advance HEP to allow the above to occur    Time 6    Period Weeks    Status On-going      PT LONG TERM GOAL #4   Title PT pain in her low back and Rt leg to be no greater than a 3/10 to allow pt to sit for an hour and a half for traveling/ watching television.    Time 6    Period Weeks    Status On-going      PT LONG TERM GOAL #5   Title PT to be able to walk for 30 minutes without fear of her right leg going out for shopping and housework dutiesn    Time 6    Period Weeks    Status On-going      PT LONG TERM GOAL #6   Title PT to be able to stand for 30 mintues in order to make a meal    Time 6    Period Weeks    Status On-going                 Plan - 04/16/20 1535    Clinical Impression Statement Patient tolerated session well. Focused on continued strengthening in weight bearing position. No pain noted but fatigue noted end of session. Patient is progressing well. Continues to tolerate more advanced exercises each session.    Examination-Activity Limitations Caring  for Others;Carry;Dressing;Lift;Locomotion Level;Sit;Squat;Stairs;Stand;Toileting    Examination-Participation Restrictions Cleaning;Community Activity;Laundry;Meal Prep;Shop;Yard Work    Stability/Clinical Decision Making Stable/Uncomplicated    Rehab Potential Good    PT Frequency 2x / week    PT Duration 6 weeks    PT Treatment/Interventions Manual techniques;Patient/family education;Stair training;Gait training;Functional mobility training;Balance training;Therapeutic exercise;Therapeutic activities    PT Next Visit Plan Continue core and LE strengthening exercises; progress strengthening, core and  balance exercises as able; advance HEP as able.    PT Home Exercise Plan LAQ, ankle pumps while sitting, SLR, bridge.; 04/07/20 - clam, sidelying abduction; prone  hip extension and knee flexion, standing heel/toe raise; 7/27 crossover step up, chair pose, step up/down    Consulted and Agree with Plan of Care Patient           Patient will benefit from skilled therapeutic intervention in order to improve the following deficits and impairments:  Decreased activity tolerance, Decreased balance, Decreased mobility, Decreased range of motion, Difficulty walking, Decreased strength, Pain  Visit Diagnosis: Radiculopathy, lumbosacral region     Problem List Patient Active Problem List   Diagnosis Date Noted  . Microvascular angina ([place]) 01/01/2019  . Unstable angina ([place])   . Common migraine with intractable migraine 06/16/2017  . Tremor 11/16/2016  . Trochanteric bursitis of left hip 02/04/2016  . Trochanteric bursitis of both hips 10/31/2015  . Chest pain at rest 06/17/2015  . Inferior myocardial infarction ([place]) 06/17/2015  . Sprain of interphalangeal joint of left little finger 02/05/2014  . Pain in joint, hand 02/05/2014  . Midsternal chest pain 06/04/2013  . CAD (coronary artery disease)   . Fibromyalgia   . GERD (gastroesophageal reflux disease)   . Migraine headache   . Acute  non-STEMI < 8 weeks prior, subsequent admission after initial 01/16/2013  . Radial nerve laceration 03/27/2012  . Lack of coordination 03/27/2012  . Muscle weakness (generalized) 03/27/2012    4:34 PM, 04/16/20 Jerene Pitch, DPT Physical Therapy with [facility]  [phone] office  [place] [address] [place], [name], [address] Phone: [phone]   Fax:  [phone]  Name: Stacey Lawrence MRN: [id number] Date of Birth: February 17, 1951

## 2020-04-20 ENCOUNTER — Encounter (HOSPITAL_COMMUNITY): Payer: Self-pay | Admitting: Physical Therapy

## 2020-04-20 ENCOUNTER — Ambulatory Visit (HOSPITAL_COMMUNITY): Payer: Medicare Other | Attending: Chiropractic Medicine | Admitting: Physical Therapy

## 2020-04-20 ENCOUNTER — Other Ambulatory Visit: Payer: Self-pay

## 2020-04-20 DIAGNOSIS — M5417 Radiculopathy, lumbosacral region: Secondary | ICD-10-CM | POA: Insufficient documentation

## 2020-04-20 NOTE — Therapy (Signed)
[place], [name], [address] Phone: [phone]   Fax:  [phone]  Physical Therapy Treatment  Patient Details  Name: Stacey Lawrence MRN: [id number] Date of Birth: [birth year] Referring Provider (PT): Levy Pupa   Encounter Date: 04/20/2020   PT End of Session - 04/20/20 1401    Visit Number 6    Number of Visits 12    Date for PT Re-Evaluation 04/29/20    Authorization Type medicare    Progress Note Due on Visit 10    PT Start Time 1401    PT Stop Time 1440    PT Time Calculation (min) 39 min    Activity Tolerance Patient tolerated treatment well    Behavior During Therapy [facility] for tasks assessed/performed           Past Medical History:  Diagnosis Date  . Anemia    hx  . Anxiety   . CAD (coronary artery disease)    a. 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 05/2013 Lexi CL: EF 77%, old small inferolat scar, no ischemia.  . Common migraine with intractable migraine 06/16/2017  . Depression   . Fibromyalgia   . GERD (gastroesophageal reflux disease)    hasn't started taking any meds  . History of blood transfusion    [place]  . History of kidney stones   . Hx of colonic polyps   . IBS (irritable bowel syndrome)    constipation  . Joint pain   . Joint swelling   . Migraine    "q other week" (02/04/2016)  . [place]  . Numbness    right leg  . Polycythemia   . PONV (postoperative nausea and vomiting)   . Raynaud's disease   . Scoliosis   . Shortness of breath dyspnea    with exertion  . SI (sacroiliac) joint dysfunction    right  . Wears glasses     Past Surgical History:  Procedure Laterality Date  . ABDOMINAL  HYSTERECTOMY     partial  . APPENDECTOMY     with explor. lap.  . Marland KitchenARDIAC CATHETERIZATION    . CHOLECYSTECTOMY  10/28/2002   lap.  . COLONOSCOPY    . DEBRIDEMENT TENNIS ELBOW    . DIAGNOSTIC LAPAROSCOPY    . DILATION AND CURETTAGE OF UTERUS    . ELBOW SURGERY     golfer's elbow-bilateral  . ESOPHAGOGASTRODUODENOSCOPY  09/10/2012   Procedure: ESOPHAGOGASTRODUODENOSCOPY (EGD);  Surgeon: NaRogene HoustonMD;  [facility];  Service: Endoscopy;  Laterality: N/A;  730  . EXCISION/RELEASE BURSA HIP Left 02/04/2016   Procedure: LEFT HIP TROCHANTERIC BURSECTOMY;  Surgeon: ChMcarthur RossettiMD;  [place] [facility] Service: [place]ics;  Laterality: Left;  . I & D EXTREMITY  02/25/2012   Procedure: IRRIGATION AND DEBRIDEMENT EXTREMITY;  Surgeon: WiRoseanne KaufmanMD;  [place] Service: Orth[place]ics;  Laterality: Right;  Irrigation and Debridement and Repair Right Thumb Nerve Laceration and Assoiated Structures   . KNEE DEBRIDEMENT     right  . LAPAROTOMY    . LEFT HEART CATHETERIZATION WITH CORONARY ANGIOGRAM N/A 01/16/2013   Procedure: LEFT HEART CATHETERIZATION  WITH CORONARY ANGIOGRAM;  Surgeon: Jolaine Artist, MD;  Location[facility] CATH LAB;  Service: Cardiovascular;  Laterality: N/A;  . SACROILIAC JOINT FUSION Right 05/02/2019   Procedure: SACROILIAC JOINT FUSION;  Surgeon: Melina Schools, MD;  Location: [place];  Service: Orthopedics;  Laterality: Right;  SACROILIAC JOINT FUSION  . STAPEDECTOMY     right  . TONSILLECTOMY AND ADENOIDECTOMY    . TRIGGER FINGER RELEASE  08/19/2011   Procedure: RELEASE TRIGGER FINGER/A-1 PULLEY;  Surgeon: Willa Frater III;  Location: [place];  Service: Orthopedics;  Laterality: Right;  right middle finger a-1 pulley release with tenosynovectomy  . TROCHANTERIC BURSA EXCISION Right 02/04/2016  . TUBAL LIGATION    . TYMPANOPLASTY      There were no vitals filed for this visit.   Subjective Assessment - 04/20/20 1420     Subjective States overall, she feels about 50% better and her leg is not giving out as much. States she can walk about 1000 steps continuous but more than that she feels week.  States overall her worse pain has been a 4/10, states she no longer has pain with sitting for an hour.    Pertinent History fusion of SI, anxiety, CAD, Fibromyalgia, back pain, knee pain    Limitations Reading;Sitting;Standing;Walking;House hold activities;Lifting    How long can you sit comfortably? Pt is able to sit for 30 miinutes    How long can you stand comfortably? 20-30 minutes    How long can you walk comfortably? 15 minutes    Diagnostic tests none    Patient Stated Goals to have less pain and have her leg stronger.  To stop falling    Currently in Pain? No/denies    Pain Onset More than a month ago              [facility] PT Assessment - 04/20/20 0001      Assessment   Medical Diagnosis SI pain     Referring Provider (PT) Levy Pupa      Balance   Balance Assessed Yes      Static Standing Balance   Static Standing - Comment/# of Minutes SLS R 20 seconds and left 16 second moderate sway noted bilaterally                          [facility] Adult PT Treatment/Exercise - 04/20/20 0001      Knee/Hip Exercises: Standing   Other Standing Knee Exercises SLS - practiced bilaterally with occassional UE support - 7 minutes total ; tandem on blue foam 3x30" B     Other Standing Knee Exercises crossover stepups 6" step 3x5 B UE support on bar      Knee/Hip Exercises: Seated   Sit to Sand 5 reps;3 sets   cues for knees wide and butt back, slow lower, 10" holds                 PT Education - 04/20/20 1418    Education Details FOTO score, HEP, progress and progress to be made.    Person(s) Educated Patient    Methods Explanation    Comprehension Verbalized understanding            PT Short Term Goals - 04/20/20 1406      PT SHORT TERM GOAL #1   Title PT muscle strength in Rt LE  to be increased to a 4/5 to allow pt to be able to come sit  to stand without use of UE>    Time 3    Period Weeks    Status On-going    Target Date 04/08/20      PT SHORT TERM GOAL #2   Title PT to be able to single leg stance for at least 10 seconds to allow decrease number of falls; currently falling 2x /week    Baseline see objective    Time 3    Period Weeks    Status Achieved      PT SHORT TERM GOAL #3   Title PT to be completing her HEP to allow the above to occure.    Time 3    Period Weeks    Status Achieved      PT SHORT TERM GOAL #4   Title Pt pain level to decrease to no greater than a 5/10 to allow pt to sit for an hour at a time for traveling/eating.    Time 3    Period Weeks    Status Achieved             PT Long Term Goals - 04/20/20 1411      PT LONG TERM GOAL #1   Title PT mm strength in her core and Rt LE  to be at least 4+/5 to be able to get up from a squatted position    Time 6    Period Weeks    Status On-going      PT LONG TERM GOAL #2   Title PT to be able to single leg stance on each leg for at least 20 seconds to allow pt to have not fallen in the past 2 weeks.    Time 6    Period Weeks    Status On-going      PT LONG TERM GOAL #3   Title PT to be I in an advance HEP to allow the above to occur    Time 6    Period Weeks    Status On-going      PT LONG TERM GOAL #4   Title PT pain in her low back and Rt leg to be no greater than a 3/10 to allow pt to sit for an hour and a half for traveling/ watching television.    Time 6    Period Weeks    Status On-going      PT LONG TERM GOAL #5   Title PT to be able to walk for 30 minutes without fear of her right leg going out for shopping and housework dutiesn    Baseline currently about 15 minutes    Time 6    Period Weeks    Status On-going      PT LONG TERM GOAL #6   Title PT to be able to stand for 30 mintues in order to make a meal    Baseline 1.5 hrs making zucchini bread    Time 6     Period Weeks    Status Achieved                 Plan - 04/20/20 1438    Clinical Impression Statement Performed FOTO and patient is progressing well. Continued with balance and strengthening exercises. No pain reported but fatigue in legs noted after strengthening exercises. Patient overall is progressing well and has met  short term goals and 1/6 long term goals. Will continue to focus on strengthening and balance as tolerated.    Examination-Activity Limitations Caring for  Others;Carry;Dressing;Lift;Locomotion Level;Sit;Squat;Stairs;Stand;Toileting    Examination-Participation Restrictions Cleaning;Community Activity;Laundry;Meal Prep;Shop;Yard Work    Stability/Clinical Decision Making Stable/Uncomplicated    Rehab Potential Good    PT Frequency 2x / week    PT Duration 6 weeks    PT Treatment/Interventions Manual techniques;Patient/family education;Stair training;Gait training;Functional mobility training;Balance training;Therapeutic exercise;Therapeutic activities    PT Next Visit Plan Continue core and LE strengthening exercises; progress strengthening, core and  balance exercises as able; advance HEP as able.    PT Home Exercise Plan LAQ, ankle pumps while sitting, SLR, bridge.; 04/07/20 - clam, sidelying abduction; prone hip extension and knee flexion, standing heel/toe raise; 7/27 crossover step up, chair pose, step up/down    Consulted and Agree with Plan of Care Patient           Patient will benefit from skilled therapeutic intervention in order to improve the following deficits and impairments:  Decreased activity tolerance, Decreased balance, Decreased mobility, Decreased range of motion, Difficulty walking, Decreased strength, Pain  Visit Diagnosis: Radiculopathy, lumbosacral region     Problem List Patient Active Problem List   Diagnosis Date Noted  . Microvascular angina ([place]) 01/01/2019  . Unstable angina ([place])   . Common migraine with intractable  migraine 06/16/2017  . Tremor 11/16/2016  . Trochanteric bursitis of left hip 02/04/2016  . Trochanteric bursitis of both hips 10/31/2015  . Chest pain at rest 06/17/2015  . Inferior myocardial infarction ([place]) 06/17/2015  . Sprain of interphalangeal joint of left little finger 02/05/2014  . Pain in joint, hand 02/05/2014  . Midsternal chest pain 06/04/2013  . CAD (coronary artery disease)   . Fibromyalgia   . GERD (gastroesophageal reflux disease)   . Migraine headache   . Acute non-STEMI < 8 weeks prior, subsequent admission after initial 01/16/2013  . Radial nerve laceration 03/27/2012  . Lack of coordination 03/27/2012  . Muscle weakness (generalized) 03/27/2012   2:41 PM, 04/20/20 Jerene Pitch, DPT Physical Therapy with [facility]  [phone] office  [place] [address] [place], [name], [address] Phone: [phone]   Fax:  [phone]  Name: Stacey Lawrence MRN: [id number] Date of Birth: [birth year]

## 2020-04-21 ENCOUNTER — Ambulatory Visit (INDEPENDENT_AMBULATORY_CARE_PROVIDER_SITE_OTHER): Payer: Medicare Other | Admitting: Neurology

## 2020-04-21 ENCOUNTER — Encounter: Payer: Self-pay | Admitting: Neurology

## 2020-04-21 VITALS — BP 110/68 | HR 83 | Ht 68.0 in | Wt 151.0 lb

## 2020-04-21 DIAGNOSIS — I25118 Atherosclerotic heart disease of native coronary artery with other forms of angina pectoris: Secondary | ICD-10-CM

## 2020-04-21 DIAGNOSIS — M5431 Sciatica, right side: Secondary | ICD-10-CM

## 2020-04-21 DIAGNOSIS — R251 Tremor, unspecified: Secondary | ICD-10-CM

## 2020-04-21 DIAGNOSIS — M797 Fibromyalgia: Secondary | ICD-10-CM | POA: Diagnosis not present

## 2020-04-21 HISTORY — DX: Sciatica, right side: M54.31

## 2020-04-21 NOTE — Progress Notes (Signed)
Reason for visit: Right-sided sciatica  Referring physician: Dr. Jennette Banker is a 69 y.o. female  History of present illness:  Stacey Lawrence is a 69 year old right-handed white female seen through this office previously for an essential tremor.  The patient has a history of fibromyalgia and irritable bowel syndrome as well.  The patient has had chronic issues with back pain and right-sided sciatica.  The patient underwent an SI joint fusion procedure in August 2020.  Following the procedure, the patient did well with resolution of the pain that was initially going from the back down the leg to the ankle level.  Approximately 3 to 4 months ago, the patient helped her mother-in-law move in with her as she has Alzheimer's disease.  With the move, the patient was doing a lot of stooping and bending and lifting.  This brought on recurrence of her right-sided sciatica with pain on the right back and hip level down the leg to the ankle and foot.  The patient feels worse when she is up on her feet, she feels better with sitting.  She indicates that she has gotten weak in the right leg, the leg will tend to collapse and she may fall on occasion.  She was using a walker to ambulate for 3 to 4 weeks but she indicates that the weakness has improved somewhat and she is no longer having to use the walker.  She is in physical therapy.  She may have intermittent shock sensations down the right leg.  She continues to have her tremors which affect her ability to form handwriting and to paint.  The patient has chronic issues with bowel and bladder control, but this predated the onset of the sciatica.  The patient has gotten Kegel exercises which is helped the bladder and she has chronic constipation with her irritable bowel syndrome.  She is sent to this office for further evaluation.  She denies any neck pain or pain down the arms or numbness or weakness in the arms.  Past Medical History:  Diagnosis Date   . Anemia    hx  . Anxiety   . CAD (coronary artery disease)    a. 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 05/2013 Lexi CL: EF 77%, old small inferolat scar, no ischemia.  . Common migraine with intractable migraine 06/16/2017  . Depression   . Fibromyalgia   . GERD (gastroesophageal reflux disease)    hasn't started taking any meds  . History of blood transfusion    56yrs ago  . History of kidney stones   . Hx of colonic polyps   . IBS (irritable bowel syndrome)    constipation  . Joint pain   . Joint swelling   . Migraine    "q other week" (02/04/2016)  . Myocardial infarction ([place])   . Numbness    right leg  . Polycythemia   . PONV (postoperative nausea and vomiting)   . Raynaud's disease   . Scoliosis   . Shortness of breath dyspnea    with exertion  . SI (sacroiliac) joint dysfunction    right  . Wears glasses     Past Surgical History:  Procedure Laterality  Date  . ABDOMINAL HYSTERECTOMY     partial  . APPENDECTOMY     with explor. lap.  Marland Kitchen CARDIAC CATHETERIZATION    . CHOLECYSTECTOMY  10/28/2002   lap.  . COLONOSCOPY    . DEBRIDEMENT TENNIS ELBOW    . DIAGNOSTIC LAPAROSCOPY    . DILATION AND CURETTAGE OF UTERUS    . ELBOW SURGERY     golfer's elbow-bilateral  . ESOPHAGOGASTRODUODENOSCOPY  09/10/2012   Procedure: ESOPHAGOGASTRODUODENOSCOPY (EGD);  Surgeon: Rogene Houston, MD;  [facility];  Service: Endoscopy;  Laterality: N/A;  730  . EXCISION/RELEASE BURSA HIP Left 02/04/2016   Procedure: LEFT HIP TROCHANTERIC BURSECTOMY;  Surgeon: Mcarthur Rossetti, MD;  Location: [place];  Service: Orthopedics;  Laterality: Left;  . I & D EXTREMITY  02/25/2012   Procedure: IRRIGATION AND DEBRIDEMENT EXTREMITY;   Surgeon: Roseanne Kaufman, MD;  Location: [place];  Service: Orthopedics;  Laterality: Right;  Irrigation and Debridement and Repair Right Thumb Nerve Laceration and Assoiated Structures   . KNEE DEBRIDEMENT     right  . LAPAROTOMY    . LEFT HEART CATHETERIZATION WITH CORONARY ANGIOGRAM N/A 01/16/2013   Procedure: LEFT HEART CATHETERIZATION WITH CORONARY ANGIOGRAM;  Surgeon: Jolaine Artist, MD;  Location[facility];  Service: Cardiovascular;  Laterality: N/A;  . SACROILIAC JOINT FUSION Right 05/02/2019   Procedure: SACROILIAC JOINT FUSION;  Surgeon: Melina Schools, MD;  Location: [place];  Service: Orthopedics;  Laterality: Right;  SACROILIAC JOINT FUSION  . STAPEDECTOMY     right  . TONSILLECTOMY AND ADENOIDECTOMY    . TRIGGER FINGER RELEASE  08/19/2011   Procedure: RELEASE TRIGGER FINGER/A-1 PULLEY;  Surgeon: Willa Frater III;  Location: [place];  Service: Orthopedics;  Laterality: Right;  right middle finger a-1 pulley release with tenosynovectomy  . TROCHANTERIC BURSA EXCISION Right 02/04/2016  . TUBAL LIGATION    . TYMPANOPLASTY      Family History  Problem Relation Age of Onset  . Cancer Mother        Deceased with lung CA  . Heart failure Mother   . Heart disease Mother   . COPD Mother   . Varicose Veins Mother   . Depression Mother   . Cancer Father        Deceased with lung CA  . Heart disease Father   . Varicose Veins Father   . Depression Father   . Depression Brother   . Lung cancer Brother   . ADD / ADHD Son   . Parkinson's disease Sister   . Other Sister        enlarged heart   . Tremor Maternal Grandmother   . Parkinson's disease Paternal Grandfather   . Lung cancer Other        3 aunts, 2 uncles on both sides of family     Social history:  reports that she has never smoked. She has never used smokeless tobacco. She reports that she does not drink alcohol and does not use drugs.  Medications:  Prior to Admission medications     Medication Sig Start Date End Date Taking? Authorizing Provider  Ascorbic Acid (VITAMIN C PO) Take 2 tablets by mouth daily.   Yes [provider]  Biotin (BIOTIN 5000) 5 MG CAPS Take 5 mg by mouth daily.    Yes [provider]  Calcium Citrate-Vitamin D (CALCITRATE/VITAMIN D PO) Take 1 tablet by mouth daily.   Yes [provider]  carisoprodol (  SOMA) 350 MG tablet Take 350 mg by mouth daily as needed for muscle spasms.    Yes [provider]  diltiazem (CARDIZEM CD) 120 MG 24 hr capsule Take 1 capsule (120 mg total) by mouth daily. 07/19/19  Yes Herminio Commons, MD  DULoxetine (CYMBALTA) 30 MG capsule 90 mg daily (take along with 60 mg) 02/05/20  Yes Hisada, Elie Goody, MD  DULoxetine (CYMBALTA) 60 MG capsule Take 1 capsule (60 mg total) by mouth daily. 02/05/20  Yes Norman Clay, MD  gabapentin (NEURONTIN) 100 MG capsule Take 200 mg by mouth 2 (two) times daily.   Yes [provider]  promethazine (PHENERGAN) 25 MG tablet Take 25 mg by mouth every 6 (six) hours as needed for nausea or vomiting.   Yes [provider]      Allergies  Allergen Reactions  . Dilaudid [Hydromorphone Hcl] Other (See Comments)    Resp. arrest  . Codeine Nausea And Vomiting and Rash  . Morphine And Related Nausea And Vomiting    ROS:  Out of a complete 14 system review of symptoms, the patient complains only of the following symptoms, and all other reviewed systems are negative.  Back pain, right leg pain Leg weakness, walking difficulty Constipation  Blood pressure 110/68, pulse 83, height 5\' 8"  (1.727 m), weight 151 lb (68.5 kg).  Physical Exam  General: The patient is alert and cooperative at the time of the examination.  Eyes: Pupils are equal, round, and reactive to light. Discs are flat bilaterally.  Neck: The neck is supple, no carotid bruits are noted.  Respiratory: The respiratory examination is clear.  Cardiovascular: The  cardiovascular examination reveals a regular rate and rhythm, no obvious murmurs or rubs are noted.  Neuromuscular: The patient has good flexion of the low back.  Good rotation of the back.  Skin: Extremities are without significant edema.  Neurologic Exam  Mental status: The patient is alert and oriented x 3 at the time of the examination. The patient has apparent normal recent and remote memory, with an apparently normal attention span and concentration ability.  Cranial nerves: Facial symmetry is present. There is good sensation of the face to pinprick and soft touch bilaterally. The strength of the facial muscles and the muscles to head turning and shoulder shrug are normal bilaterally. Speech is well enunciated, no aphasia or dysarthria is noted. Extraocular movements are full. Visual fields are full. The tongue is midline, and the patient has symmetric elevation of the soft palate. No obvious hearing deficits are noted.  Motor: The motor testing reveals 5 over 5 strength of all 4 extremities, but the patient does have some giveaway type weakness of the right leg proximally and distally. Good symmetric motor tone is noted throughout.  Sensory: Sensory testing is intact to pinprick, soft touch, vibration sensation, and position sense on all 4 extremities, with exception of some slight decrease in pinprick station on the right leg below the knee excluding the feet which are of equal sensation. No evidence of extinction is noted.  Coordination: Cerebellar testing reveals good finger-nose-finger and heel-to-shin bilaterally.  Gait and station: Gait is normal. Tandem gait is slightly unsteady. Romberg is negative. No drift is seen.  The patient is able to walk on heels and the toes bilaterally.  Reflexes: Deep tendon reflexes are symmetric and normal bilaterally.  The ankle jerk reflexes are well-maintained bilaterally.  Toes are downgoing bilaterally.    MRI lumbar spine Feb 10, 2020:  Impression:  1.  L4-5 moderate/prominent moderate central canal stenosis with bilateral L5 anterior lateral canal contact without compression, moderate left foraminal stenosis mild L4 nerve root displacement and prominent moderate right foraminal stenosis with right L4 nerve root contact and probable mild compression with displacement by small cranial extrusion.  2.  L5-S1 mild to moderate central canal stenosis with bilateral S1 lateral canal contact between disc and facet joint with moderate by foraminal stenosis.  3.  L3-4 moderate left foraminal stenosis with left L3 nerve root contact and displacement.  4.  No canal compromise, no cord or cauda equina impingement from the T12 level through the upper sacrum.  5.  Probable right S1 joint instrumentation fusion with mild ferromagnetic artifact arising from the right SI joint region.  The disc was brought for my review, I agree with above assessment.   Assessment/Plan:  1.  Right-sided sciatica  2.  History of fibromyalgia  3.  Essential tremor  The patient has had some worsening of her right sciatic discomfort with an increase in physical activity recently.  She is in physical therapy currently.  She has giveaway weakness of the right leg but no true weakness is seen.  She will be set up for nerve conduction studies on both legs and EMG on the right leg.  Recent MRI of the lumbar spine has shown potential for nerve root impingement at multiple levels.  Jill Alexanders MD 04/21/2020 9:45 AM  [facility] [address] [place], [place] [address]  Phone [phone] Fax [phone]
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
## 2020-04-23 ENCOUNTER — Ambulatory Visit (HOSPITAL_COMMUNITY): Payer: Medicare Other | Admitting: Physical Therapy

## 2020-04-23 ENCOUNTER — Other Ambulatory Visit: Payer: Self-pay

## 2020-04-23 ENCOUNTER — Encounter (HOSPITAL_COMMUNITY): Payer: Self-pay | Admitting: Physical Therapy

## 2020-04-23 DIAGNOSIS — M5417 Radiculopathy, lumbosacral region: Secondary | ICD-10-CM

## 2020-04-23 NOTE — Therapy (Signed)
[place], [name], [address] Phone: [phone]   Fax:  [phone]  Physical Therapy Treatment  Patient Details  Name: Stacey Lawrence MRN: [id number] Date of Birth: October 06, 1950 Referring Provider (PT): Levy Pupa   Encounter Date: 04/23/2020   PT End of Session - 04/23/20 1400    Visit Number 7    Number of Visits 12    Date for PT Re-Evaluation 04/29/20    Authorization Type medicare    Progress Note Due on Visit 10    PT Start Time 1400    PT Stop Time 1439    PT Time Calculation (min) 39 min    Activity Tolerance Patient tolerated treatment well    Behavior During Therapy [facility] for tasks assessed/performed           Past Medical History:  Diagnosis Date  . Anemia    hx  . Anxiety   . CAD (coronary artery disease)    a. 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 05/2013 [facility] CL: EF 77%, old small inferolat scar, no ischemia.  . Common migraine with intractable migraine 06/16/2017  . Depression   . Fibromyalgia   . GERD (gastroesophageal reflux disease)    hasn't started taking any meds  . History of blood transfusion    60yrs ago  . History of kidney stones   . Hx of colonic polyps   . IBS (irritable bowel syndrome)    constipation  . Joint pain   . Joint swelling   . Migraine    "q other week" (02/04/2016)  . Myocardial infarction ([place])   . Numbness    right leg  . Polycythemia   . PONV (postoperative nausea and vomiting)   . Raynaud's disease   . Sciatica of right side 04/21/2020  . Scoliosis   . Shortness of breath dyspnea    with exertion  . SI (sacroiliac) joint dysfunction    right  . Wears glasses     Past Surgical History:  Procedure  Laterality Date  . ABDOMINAL HYSTERECTOMY     partial  . APPENDECTOMY     with explor. lap.  [facility] CARDIAC CATHETERIZATION    . CHOLECYSTECTOMY  10/28/2002   lap.  . COLONOSCOPY    . DEBRIDEMENT TENNIS ELBOW    . DIAGNOSTIC LAPAROSCOPY    . DILATION AND CURETTAGE OF UTERUS    . ELBOW SURGERY     golfer's elbow-bilateral  . ESOPHAGOGASTRODUODENOSCOPY  09/10/2012   Procedure: ESOPHAGOGASTRODUODENOSCOPY (EGD);  Surgeon: Rogene Houston, MD;  [facility];  Service: Endoscopy;  Laterality: N/A;  730  . EXCISION/RELEASE BURSA HIP Left 02/04/2016   Procedure: LEFT HIP TROCHANTERIC BURSECTOMY;  Surgeon: Mcarthur Rossetti, MD;  Location: [place];  Service: Orthopedics;  Laterality: Left;  . I & D EXTREMITY  02/25/2012   Procedure: IRRIGATION AND DEBRIDEMENT EXTREMITY;  Surgeon: Roseanne Kaufman, MD;  Location: [place];  Service: Orthopedics;  Laterality: Right;  Irrigation and Debridement and Repair Right Thumb Nerve Laceration and Assoiated Structures   . KNEE DEBRIDEMENT     right  . LAPAROTOMY    . LEFT HEART CATHETERIZATION WITH CORONARY ANGIOGRAM N/A  01/16/2013   Procedure: LEFT HEART CATHETERIZATION WITH CORONARY ANGIOGRAM;  Surgeon: Jolaine Artist, MD;  [facility] C[facility] LAB;  Service: Cardiovascular;  Laterality: N/A;  . SACROILIAC JOINT FUSION Right 05/02/2019   Procedure: SACROILIAC JOINT FUSION;  Surgeon: Melina Schools, MD;  Location: [place];  Service: Orthopedics;  Laterality: Right;  SACROILIAC JOINT FUSION  . STAPEDECTOMY     right  . TONSILLECTOMY AND ADENOIDECTOMY    . TRIGGER FINGER RELEASE  08/19/2011   Procedure: RELEASE TRIGGER FINGER/A-1 PULLEY;  Surgeon: Willa Frater III;  Location: [place];  Service: Orthopedics;  Laterality: Right;  right middle finger a-1 pulley release with tenosynovectomy  . TROCHANTERIC BURSA EXCISION Right 02/04/2016  . TUBAL LIGATION    . TYMPANOPLASTY      There were no vitals filed for this visit.   Subjective  Assessment - 04/23/20 1404    Subjective Slight increase in pain and soreness due to lifting heavy boxes yesterday. States it hurt a little when she was doing it but it hurt more this morning. States hot shower helped a little. [facility] nerve study scheduled fo 9/1st.    Pertinent History fusion of SI, anxiety, CAD, Fibromyalgia, back pain, knee pain    Limitations Reading;Sitting;Standing;Walking;House hold activities;Lifting    How long can you sit comfortably? Pt is able to sit for 30 miinutes    How long can you stand comfortably? 20-30 minutes    How long can you walk comfortably? 15 minutes    Diagnostic tests none    Patient Stated Goals to have less pain and have her leg stronger.  To stop falling    Currently in Pain? Yes    Pain Score 6     Pain Location Back    Pain Orientation Right;Left;Lower    Pain Descriptors / Indicators Tender;Sore    Pain Type Chronic pain    Pain Onset More than a month ago              [facility] PT Assessment - 04/23/20 0001      Assessment   Medical Diagnosis SI pain     Referring Provider (PT) Levy Pupa                         [facility] Adult PT Treatment/Exercise - 04/23/20 0001      Knee/Hip Exercises: Standing   Forward Step Up Both;5 sets;5 reps;Hand Hold: 2;Step Height: 6"   with knee drive    Other Standing Knee Exercises standing palof pres - 2 weight plates and 4 lateral steps and 5 punches each rep x5 B ; chair pose 2x5 15" holds     Other Standing Knee Exercises mini split squats 4" 4x5 B - UE support                    PT Short Term Goals - 04/20/20 1406      PT SHORT TERM GOAL #1   Title PT muscle strength in Rt LE to be increased to a 4/5 to allow pt to be able to come sit to stand without use of UE>    Time 3    Period Weeks    Status On-going    Target Date 04/08/20      PT SHORT TERM GOAL #2   Title PT to be able to single leg stance for at least 10 seconds to allow decrease number of falls;  currently falling 2x /  week    Baseline see objective    Time 3    Period Weeks    Status Achieved      PT SHORT TERM GOAL #3   Title PT to be completing her HEP to allow the above to occure.    Time 3    Period Weeks    Status Achieved      PT SHORT TERM GOAL #4   Title Pt pain level to decrease to no greater than a 5/10 to allow pt to sit for an hour at a time for traveling/eating.    Time 3    Period Weeks    Status Achieved             PT Long Term Goals - 04/20/20 1411      PT LONG TERM GOAL #1   Title PT mm strength in her core and Rt LE  to be at least 4+/5 to be able to get up from a squatted position    Time 6    Period Weeks    Status On-going      PT LONG TERM GOAL #2   Title PT to be able to single leg stance on each leg for at least 20 seconds to allow pt to have not fallen in the past 2 weeks.    Time 6    Period Weeks    Status On-going      PT LONG TERM GOAL #3   Title PT to be I in an advance HEP to allow the above to occur    Time 6    Period Weeks    Status On-going      PT LONG TERM GOAL #4   Title PT pain in her low back and Rt leg to be no greater than a 3/10 to allow pt to sit for an hour and a half for traveling/ watching television.    Time 6    Period Weeks    Status On-going      PT LONG TERM GOAL #5   Title PT to be able to walk for 30 minutes without fear of her right leg going out for shopping and housework dutiesn    Baseline currently about 15 minutes    Time 6    Period Weeks    Status On-going      PT LONG TERM GOAL #6   Title PT to be able to stand for 30 mintues in order to make a meal    Baseline 1.5 hrs making zucchini bread    Time 6    Period Weeks    Status Achieved                 Plan - 04/23/20 1400    Clinical Impression Statement Focused on lighter resistance today secondary to slight increase in symptoms due to lifting yesterday. No increase in symptoms noted during today's session. Cued patient for  longer rest breaks as she has a tendency to not rest between sets.  Fatigue noted in hips and core end of session. Will continue to hip and core strength as tolerated.    Examination-Activity Limitations Caring for Others;Carry;Dressing;Lift;Locomotion Level;Sit;Squat;Stairs;Stand;Toileting    Examination-Participation Restrictions Cleaning;Community Activity;Laundry;Meal Prep;Shop;Yard Work    Stability/Clinical Decision Making Stable/Uncomplicated    Rehab Potential Good    PT Frequency 2x / week    PT Duration 6 weeks    PT Treatment/Interventions Manual techniques;Patient/family education;Stair training;Gait training;Functional mobility training;Balance training;Therapeutic exercise;Therapeutic activities  PT Next Visit Plan Continue core and LE strengthening exercises; progress strengthening, core and  balance exercises as able; advance HEP as able.    PT Home Exercise Plan LAQ, ankle pumps while sitting, SLR, bridge.; 04/07/20 - clam, sidelying abduction; prone hip extension and knee flexion, standing heel/toe raise; 7/27 crossover step up, chair pose, step up/down    Consulted and Agree with Plan of Care Patient           Patient will benefit from skilled therapeutic intervention in order to improve the following deficits and impairments:  Decreased activity tolerance, Decreased balance, Decreased mobility, Decreased range of motion, Difficulty walking, Decreased strength, Pain  Visit Diagnosis: Radiculopathy, lumbosacral region     Problem List Patient Active Problem List   Diagnosis Date Noted  . Sciatica of right side 04/21/2020  . Microvascular angina ([place]) 01/01/2019  . Unstable angina ([place])   . Common migraine with intractable migraine 06/16/2017  . Tremor 11/16/2016  . Trochanteric bursitis of left hip 02/04/2016  . Trochanteric bursitis of both hips 10/31/2015  . Chest pain at rest 06/17/2015  . Inferior myocardial infarction ([place]) 06/17/2015  . Sprain of  interphalangeal joint of left little finger 02/05/2014  . Pain in joint, hand 02/05/2014  . Midsternal chest pain 06/04/2013  . CAD (coronary artery disease)   . Fibromyalgia   . GERD (gastroesophageal reflux disease)   . Migraine headache   . Acute non-STEMI < 8 weeks prior, subsequent admission after initial 01/16/2013  . Radial nerve laceration 03/27/2012  . Lack of coordination 03/27/2012  . Muscle weakness (generalized) 03/27/2012   2:39 PM, 04/23/20 Jerene Pitch, DPT Physical Therapy with [facility]  [phone] office  [place] [address] [place], [name], [address] Phone: [phone]   Fax:  [phone]  Name: Stacey Lawrence MRN: [id number] Date of Birth: 12/05/1950

## 2020-04-28 ENCOUNTER — Encounter (HOSPITAL_COMMUNITY): Payer: Self-pay | Admitting: Psychiatry

## 2020-04-28 ENCOUNTER — Encounter (HOSPITAL_COMMUNITY): Payer: Self-pay

## 2020-04-28 ENCOUNTER — Ambulatory Visit (HOSPITAL_COMMUNITY): Payer: Medicare Other

## 2020-04-28 ENCOUNTER — Other Ambulatory Visit: Payer: Self-pay

## 2020-04-28 ENCOUNTER — Telehealth (INDEPENDENT_AMBULATORY_CARE_PROVIDER_SITE_OTHER): Payer: Medicare Other | Admitting: Psychiatry

## 2020-04-28 DIAGNOSIS — M5417 Radiculopathy, lumbosacral region: Secondary | ICD-10-CM

## 2020-04-28 DIAGNOSIS — G4700 Insomnia, unspecified: Secondary | ICD-10-CM | POA: Diagnosis not present

## 2020-04-28 DIAGNOSIS — F3341 Major depressive disorder, recurrent, in partial remission: Secondary | ICD-10-CM | POA: Diagnosis not present

## 2020-04-28 MED ORDER — DULOXETINE HCL 30 MG PO CPEP: ORAL_CAPSULE | ORAL | 0 refills | Status: DC

## 2020-04-28 MED ORDER — DULOXETINE HCL 60 MG PO CPEP: 60.0000 mg | ORAL_CAPSULE | Freq: Every day | ORAL | 0 refills | Status: DC

## 2020-04-28 MED ORDER — DOXEPIN HCL 10 MG PO CAPS: 10.0000 mg | ORAL_CAPSULE | Freq: Every evening | ORAL | 2 refills | Status: DC | PRN

## 2020-04-28 MED FILL — DULoxetine HCL 30 MG CPEP: 30 | 90 days supply | Qty: 90 | Fill #0

## 2020-04-28 MED FILL — DULOXETINE HCL 60 MG CPEP: 60 | 90 days supply | Qty: 90 | Fill #0

## 2020-04-28 MED FILL — DOXEPIN HCL 10 MG CAPS: 10 | 30 days supply | Qty: 30 | Fill #0

## 2020-04-28 NOTE — Patient Instructions (Signed)
1.Continueduloxetine 90 mg(60 mg + 30 mg)daily  2. Start doxepin 10 mg at night  3. Next appointment- 11/9 at 9:10

## 2020-04-28 NOTE — Therapy (Signed)
[place], [name], [address] Phone: [phone]   Fax:  [phone]  Physical Therapy Treatment  Patient Details  Name: Stacey Lawrence MRN: [id number] Date of Birth: 05-06-1951 Referring Provider (PT): Levy Pupa   Encounter Date: 04/28/2020   PT End of Session - 04/28/20 1414    Visit Number 8    Number of Visits 12    Date for PT Re-Evaluation 04/29/20    Authorization Type medicare    Progress Note Due on Visit 10    PT Start Time 5643    PT Stop Time 1443    PT Time Calculation (min) 38 min    Activity Tolerance Patient tolerated treatment well;Patient limited by pain;No increased pain    Behavior During Therapy [facility] for tasks assessed/performed           Past Medical History:  Diagnosis Date  . Anemia    hx  . Anxiety   . CAD (coronary artery disease)    a. 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 05/2013 [facility] CL: EF 77%, old small inferolat scar, no ischemia.  . Common migraine with intractable migraine 06/16/2017  . Depression   . Fibromyalgia   . GERD (gastroesophageal reflux disease)    hasn't started taking any meds  . History of blood transfusion    45yrs ago  . History of kidney stones   . Hx of colonic polyps   . IBS (irritable bowel syndrome)    constipation  . Joint pain   . Joint swelling   . Migraine    "q other week" (02/04/2016)  . Myocardial infarction ([place])   . Numbness    right leg  . Polycythemia   . PONV (postoperative nausea and vomiting)   . Raynaud's disease   . Sciatica of right side 04/21/2020  . Scoliosis   . Shortness of breath dyspnea    with exertion  . SI (sacroiliac) joint dysfunction    right  . Wears glasses      Past Surgical History:  Procedure Laterality Date  . ABDOMINAL HYSTERECTOMY     partial  . APPENDECTOMY     with explor. lap.  [facility] CARDIAC CATHETERIZATION    . CHOLECYSTECTOMY  10/28/2002   lap.  . COLONOSCOPY    . DEBRIDEMENT TENNIS ELBOW    . DIAGNOSTIC LAPAROSCOPY    . DILATION AND CURETTAGE OF UTERUS    . ELBOW SURGERY     golfer's elbow-bilateral  . ESOPHAGOGASTRODUODENOSCOPY  09/10/2012   Procedure: ESOPHAGOGASTRODUODENOSCOPY (EGD);  Surgeon: Rogene Houston, MD;  [facility];  Service: Endoscopy;  Laterality: N/A;  730  . EXCISION/RELEASE BURSA HIP Left 02/04/2016   Procedure: LEFT HIP TROCHANTERIC BURSECTOMY;  Surgeon: Mcarthur Rossetti, MD;  Location: [place];  Service: Orthopedics;  Laterality: Left;  . I & D EXTREMITY  02/25/2012   Procedure: IRRIGATION AND DEBRIDEMENT EXTREMITY;  Surgeon: Roseanne Kaufman, MD;  Location: [place];  Service: Orthopedics;  Laterality: Right;  Irrigation and Debridement and Repair Right Thumb Nerve Laceration and Assoiated Structures   . KNEE DEBRIDEMENT     right  . LAPAROTOMY    . LEFT HEART  CATHETERIZATION WITH CORONARY ANGIOGRAM N/A 01/16/2013   Procedure: LEFT HEART CATHETERIZATION WITH CORONARY ANGIOGRAM;  Surgeon: Jolaine Artist, MD;  Location: [facility] CATH LAB;  Service: Cardiovascular;  Laterality: N/A;  . SACROILIAC JOINT FUSION Right 05/02/2019   Procedure: SACROILIAC JOINT FUSION;  Surgeon: Melina Schools, MD;  Location: [place];  Service: Orthopedics;  Laterality: Right;  SACROILIAC JOINT FUSION  . STAPEDECTOMY     right  . TONSILLECTOMY AND ADENOIDECTOMY    . TRIGGER FINGER RELEASE  08/19/2011   Procedure: RELEASE TRIGGER FINGER/A-1 PULLEY;  Surgeon: Willa Frater III;  Location: [place];  Service: Orthopedics;  Laterality: Right;  right middle finger a-1 pulley release with tenosynovectomy  . TROCHANTERIC BURSA EXCISION Right 02/04/2016  . TUBAL LIGATION    . TYMPANOPLASTY      There were no vitals  filed for this visit.   Subjective Assessment - 04/28/20 1409    Subjective Pt stated she has been having to clean up her craft shop and has a lot of bending, squatting and cleaning.  Back is sore and stiff today.  Rt LE feels weak as well, gives out on her when moving fast.  She goes to [facility] for nerve study on 05/20/20.    Pertinent History fusion of SI, anxiety, CAD, Fibromyalgia, back pain, knee pain    Patient Stated Goals to have less pain and have her leg stronger.  To stop falling    Currently in Pain? Yes    Pain Score 5     Pain Location Back    Pain Orientation Lower;Right;Left    Pain Descriptors / Indicators Sore    Pain Type Chronic pain    Pain Radiating Towards no radicular symptom reports.    Pain Onset More than a month ago    Pain Frequency Constant    Aggravating Factors  activity and bending    Pain Relieving Factors sitting in shower and leg the hot water beat on her    Effect of Pain on Daily Activities limits                             [facility] Adult PT Treatment/Exercise - 04/28/20 0001      Knee/Hip Exercises: Standing   Functional Squat 2 sets;5 reps    Functional Squat Limitations wide base 15" holds    SLS with Vectors 3x5" BLE    Other Standing Knee Exercises standing palof pres - 2 weight plates and 4 lateral steps and 5 punches each rep x5 B ; chair pose 2x5 15" holds     Other Standing Knee Exercises mini split squats 4" 4x5 B - UE support; tandem stance on foam 1x 30" neutral; dowel rod flexion 10x each; 5 head turns each                    PT Short Term Goals - 04/20/20 1406      PT SHORT TERM GOAL #1   Title PT muscle strength in Rt LE to be increased to a 4/5 to allow pt to be able to come sit to stand without use of UE>    Time 3    Period Weeks    Status On-going    Target Date 04/08/20      PT SHORT TERM GOAL #2   Title PT to be able to single leg stance for at least 10 seconds to allow decrease number  of  falls; currently falling 2x /week    Baseline see objective    Time 3    Period Weeks    Status Achieved      PT SHORT TERM GOAL #3   Title PT to be completing her HEP to allow the above to occure.    Time 3    Period Weeks    Status Achieved      PT SHORT TERM GOAL #4   Title Pt pain level to decrease to no greater than a 5/10 to allow pt to sit for an hour at a time for traveling/eating.    Time 3    Period Weeks    Status Achieved             PT Long Term Goals - 04/20/20 1411      PT LONG TERM GOAL #1   Title PT mm strength in her core and Rt LE  to be at least 4+/5 to be able to get up from a squatted position    Time 6    Period Weeks    Status On-going      PT LONG TERM GOAL #2   Title PT to be able to single leg stance on each leg for at least 20 seconds to allow pt to have not fallen in the past 2 weeks.    Time 6    Period Weeks    Status On-going      PT LONG TERM GOAL #3   Title PT to be I in an advance HEP to allow the above to occur    Time 6    Period Weeks    Status On-going      PT LONG TERM GOAL #4   Title PT pain in her low back and Rt leg to be no greater than a 3/10 to allow pt to sit for an hour and a half for traveling/ watching television.    Time 6    Period Weeks    Status On-going      PT LONG TERM GOAL #5   Title PT to be able to walk for 30 minutes without fear of her right leg going out for shopping and housework dutiesn    Baseline currently about 15 minutes    Time 6    Period Weeks    Status On-going      PT LONG TERM GOAL #6   Title PT to be able to stand for 30 mintues in order to make a meal    Baseline 1.5 hrs making zucchini bread    Time 6    Period Weeks    Status Achieved                 Plan - 04/28/20 1443    Clinical Impression Statement Pt limited by pain following reports of lost of cleaning/lifting over weekend.  Session focus primarly with gluteal and quad strengthening with min cueing for  mechanics to acheive proper mm activation.  Added vector stance for gluteal endurance strengthening and balance training with intermittent HHA required.  Increased challenge with balance as well with head turns.  No reoprts of increased pain through session, was limited by musculature fatigue wiht activities.    Examination-Activity Limitations Caring for Others;Carry;Dressing;Lift;Locomotion Level;Sit;Squat;Stairs;Stand;Toileting    Examination-Participation Restrictions Cleaning;Community Activity;Laundry;Meal Prep;Shop;Yard Work    Stability/Clinical Decision Making Stable/Uncomplicated    Clinical Decision Making Low    Rehab Potential Good    PT Frequency  2x / week    PT Duration 6 weeks    PT Treatment/Interventions Manual techniques;Patient/family education;Stair training;Gait training;Functional mobility training;Balance training;Therapeutic exercise;Therapeutic activities    PT Next Visit Plan Review goals as cert end next session.  Continue core and LE strengthening exercises; progress strengthening, core and  balance exercises as able; advance HEP as able.    PT Home Exercise Plan LAQ, ankle pumps while sitting, SLR, bridge.; 04/07/20 - clam, sidelying abduction; prone hip extension and knee flexion, standing heel/toe raise; 7/27 crossover step up, chair pose, step up/down           Patient will benefit from skilled therapeutic intervention in order to improve the following deficits and impairments:  Decreased activity tolerance, Decreased balance, Decreased mobility, Decreased range of motion, Difficulty walking, Decreased strength, Pain  Visit Diagnosis: Radiculopathy, lumbosacral region     Problem List Patient Active Problem List   Diagnosis Date Noted  . Sciatica of right side 04/21/2020  . Microvascular angina ([place]) 01/01/2019  . Unstable angina ([place])   . Common migraine with intractable migraine 06/16/2017  . Tremor 11/16/2016  . Trochanteric bursitis of left hip  02/04/2016  . Trochanteric bursitis of both hips 10/31/2015  . Chest pain at rest 06/17/2015  . Inferior myocardial infarction ([place]) 06/17/2015  . Sprain of interphalangeal joint of left little finger 02/05/2014  . Pain in joint, hand 02/05/2014  . Midsternal chest pain 06/04/2013  . CAD (coronary artery disease)   . Fibromyalgia   . GERD (gastroesophageal reflux disease)   . Migraine headache   . Acute non-STEMI < 8 weeks prior, subsequent admission after initial 01/16/2013  . Radial nerve laceration 03/27/2012  . Lack of coordination 03/27/2012  . Muscle weakness (generalized) 03/27/2012   Ihor Austin, LPTA/CLT; CBIS [id number]  Aldona Lento 04/28/2020, 2:49 PM  [facility] [address] [place], [name], [address] Phone: [phone]   Fax:  [phone]  Name: Stacey Lawrence MRN: [id number] Date of Birth: 17-Mar-1951

## 2020-04-30 ENCOUNTER — Other Ambulatory Visit: Payer: Self-pay

## 2020-04-30 ENCOUNTER — Ambulatory Visit (HOSPITAL_COMMUNITY): Payer: Medicare Other | Admitting: Physical Therapy

## 2020-04-30 DIAGNOSIS — M5417 Radiculopathy, lumbosacral region: Secondary | ICD-10-CM | POA: Diagnosis not present

## 2020-04-30 NOTE — Therapy (Signed)
[facility] [address] [place], [name], [address] Phone: [phone]   Fax:  [phone]  Physical Therapy Treatment  Patient Details  Name: Stacey Lawrence MRN: [id number] Date of Birth: 05/30/1951 Referring Provider (PT): Levy Pupa  Progress Note Reporting Period 03/18/2020  to 04/30/2020  See note below for Objective Data and Assessment of Progress/Goals.      Encounter Date: 04/30/2020   PT End of Session - 04/30/20 1430    Visit Number 9    Number of Visits 12    Date for PT Re-Evaluation 05/19/20    Authorization Type medicare    Progress Note Due on Visit 12    PT Start Time 1400    PT Stop Time 1445    PT Time Calculation (min) 45 min    Activity Tolerance Patient tolerated treatment well;Patient limited by pain;No increased pain    Behavior During Therapy WFL for tasks assessed/performed           Past Medical History:  Diagnosis Date  . Anemia    hx  . Anxiety   . CAD (coronary artery disease)    a. 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 05/2013 Lexi CL: EF 77%, old small inferolat scar, no ischemia.  . Common migraine with intractable migraine 06/16/2017  . Depression   . Fibromyalgia   . GERD (gastroesophageal reflux disease)    hasn't started taking any meds  . History of blood transfusion    236yrago  . History of kidney stones   . Hx of colonic polyps   . IBS (irritable bowel syndrome)    constipation  . Joint pain   . Joint swelling   . Migraine    "q other week" (02/04/2016)  . Myocardial infarction ([facility]  . Numbness    right leg  . Polycythemia   . PONV (postoperative nausea and vomiting)   . Raynaud's disease   . Sciatica of right side  04/21/2020  . Scoliosis   . Shortness of breath dyspnea    with exertion  . SI (sacroiliac) joint dysfunction    right  . Wears glasses     Past Surgical History:  Procedure Laterality Date  . ABDOMINAL HYSTERECTOMY     partial  . APPENDECTOMY     with explor. lap.  . Marland KitchenARDIAC CATHETERIZATION    . CHOLECYSTECTOMY  10/28/2002   lap.  . COLONOSCOPY    . DEBRIDEMENT TENNIS ELBOW    . DIAGNOSTIC LAPAROSCOPY    . DILATION AND CURETTAGE OF UTERUS    . ELBOW SURGERY     golfer's elbow-bilateral  . ESOPHAGOGASTRODUODENOSCOPY  09/10/2012   Procedure: ESOPHAGOGASTRODUODENOSCOPY (EGD);  Surgeon: NaRogene HoustonMD;  [facility];  Service: Endoscopy;  Laterality: N/A;  730  . EXCISION/RELEASE BURSA HIP Left 02/04/2016   Procedure: LEFT HIP TROCHANTERIC BURSECTOMY;  Surgeon: ChMcarthur RossettiMD;  [facility]: [facility]ics;  Laterality: Left;  . I & D EXTREMITY  02/25/2012   Procedure: IRRIGATION AND DEBRIDEMENT EXTREMITY;  Surgeon: WiRoseanne KaufmanMD;  [place] [facility] Service: [place]ics;  Laterality: Right;  Irrigation and Debridement and Repair Right Thumb Nerve  Laceration and Assoiated Structures   . KNEE DEBRIDEMENT     right  . LAPAROTOMY    . LEFT HEART CATHETERIZATION WITH CORONARY ANGIOGRAM N/A 01/16/2013   Procedure: LEFT HEART CATHETERIZATION WITH CORONARY ANGIOGRAM;  Surgeon: Jolaine Artist, MD;  Location: [facility] C[facility] LAB;  Service: Cardiovascular;  Laterality: N/A;  . SACROILIAC JOINT FUSION Right 05/02/2019   Procedure: SACROILIAC JOINT FUSION;  Surgeon: Melina Schools, MD;  Location: [facility];  Service: Orthopedics;  Laterality: Right;  SACROILIAC JOINT FUSION  . STAPEDECTOMY     right  . TONSILLECTOMY AND ADENOIDECTOMY    . TRIGGER FINGER RELEASE  08/19/2011   Procedure: RELEASE TRIGGER FINGER/A-1 PULLEY;  Surgeon: Willa Frater III;  Location: [facility];  Service: Orthopedics;  Laterality: Right;  right middle finger a-1 pulley  release with tenosynovectomy  . TROCHANTERIC BURSA EXCISION Right 02/04/2016  . TUBAL LIGATION    . TYMPANOPLASTY      There were no vitals filed for this visit.   Subjective Assessment - 04/30/20 1408    Subjective PT states that she is not limiting the things that she is doing she is pushing thru the pain.    Pertinent History fusion of SI, anxiety, CAD, Fibromyalgia, back pain, knee pain    Limitations Reading;Sitting;Standing;Walking;House hold activities;Lifting    How long can you sit comfortably? Pt is able to sit for 30-40 minutes was  30 miinutes    How long can you stand comfortably? 30 minutes was 20-30 minutes    How long can you walk comfortably? 20 mnutes was 15 minutes    Diagnostic tests none    Patient Stated Goals to have less pain and have her leg stronger.  To stop falling    Currently in Pain? Yes    Pain Score 6     Pain Location Back    Pain Orientation Lower    Pain Descriptors / Indicators Sore    Pain Type Chronic pain    Pain Onset More than a month ago    Pain Frequency Intermittent    Aggravating Factors  activity    Pain Relieving Factors HMP    Effect of Pain on Daily Activities keeps going              [facility] PT Assessment - 04/30/20 0001      Assessment   Medical Diagnosis SI pain     Referring Provider (PT) Levy Pupa    Onset Date/Surgical Date 05/20/19    Prior Therapy yes       Precautions   Precautions None      Restrictions   Weight Bearing Restrictions No      Home Environment   Living Environment Private residence      Prior Function   Level of Independence Independent      Cognition   Overall Cognitive Status Within Functional Limits for tasks assessed      Observation/Other Assessments   Focus on Therapeutic Outcomes (FOTO)  52; 58% affected now 49; 51% affected       Functional Tests   Functional tests Single leg stance;Sit to Stand      Single Leg Stance   Comments Lt : 24 was 3 seconds; Rt :21 " was 1         Sit to Stand   Comments 7 in 30 sedconds was 6 in 30 seconds       Posture/Postural Control   Posture/Postural Control Postural limitations  Postural Limitations Decreased lumbar lordosis;Decreased thoracic kyphosis      AROM   Lumbar Flexion fingers to floor was finger tips just past knees with increased pain going down.     Lumbar Extension 24   extension increases pain.      Strength   Right Hip Flexion 4/5   was 3+    Right Hip Extension 3-/5   continues 3- with give way    Right Hip ABduction 3+/5   give way release continues to be 3+   Left Hip Flexion 5/5    Left Hip Extension 3+/5   was 4+ give way   Left Hip ABduction 5/5    Right Knee Flexion 4/5   was 3; give way   Right Knee Extension 4/5   was 3+     Left Knee Flexion 5/5    Right Ankle Dorsiflexion 3+/5   was 3+   Left Ankle Dorsiflexion 5/5                         [facility] Adult PT Treatment/Exercise - 04/30/20 0001      Exercises   Exercises Lumbar      Lumbar Exercises: Standing   Functional Squats 10 reps    Forward Lunge 10 reps    Other Standing Lumbar Exercises single leg stance x 5 B       Lumbar Exercises: Seated   Sit to Stand 5 reps      Lumbar Exercises: Sidelying   Hip Abduction Both;10 reps      Lumbar Exercises: Prone   Straight Leg Raise 10 reps                    PT Short Term Goals - 04/30/20 1432      PT SHORT TERM GOAL #1   Title PT muscle strength in Rt LE to be increased to a 4/5 to allow pt to be able to come sit to stand without use of UE>    Time 3    Period Weeks    Status Achieved    Target Date 04/08/20      PT SHORT TERM GOAL #2   Title PT to be able to single leg stance for at least 10 seconds to allow decrease number of falls; currently falling 2x /week    Baseline see objective    Time 3    Period Weeks    Status Achieved      PT SHORT TERM GOAL #3   Title PT to be completing her HEP to allow the above to occure.    Time 3     Period Weeks    Status Achieved      PT SHORT TERM GOAL #4   Title Pt pain level to decrease to no greater than a 5/10 to allow pt to sit for an hour at a time for traveling/eating.    Time 3    Period Weeks    Status Partially Met             PT Long Term Goals - 04/30/20 1433      PT LONG TERM GOAL #1   Title PT mm strength in her core and Rt LE  to be at least 4+/5 to be able to get up from a squatted position    Time 6    Period Weeks    Status Partially Met      PT LONG  TERM GOAL #2   Title PT to be able to single leg stance on each leg for at least 20 seconds to allow pt to have not fallen in the past 2 weeks.    Time 6    Period Weeks    Status Achieved      PT LONG TERM GOAL #3   Title PT to be I in an advance HEP to allow the above to occur    Time 6    Period Weeks    Status Achieved      PT LONG TERM GOAL #4   Title PT pain in her low back and Rt leg to be no greater than a 3/10 to allow pt to sit for an hour and a half for traveling/ watching television.    Time 6    Period Weeks    Status On-going      PT LONG TERM GOAL #5   Title PT to be able to walk for 30 minutes without fear of her right leg going out for shopping and housework dutiesn    Baseline currently about 15 minutes    Time 6    Period Weeks    Status On-going      PT LONG TERM GOAL #6   Title PT to be able to stand for 30 mintues in order to make a meal    Baseline 1.5 hrs making zucchini bread    Time 6    Period Weeks    Status Achieved                 Plan - 04/30/20 1430    Clinical Impression Statement PT reassessed with some noted improvement.  PT will most likely be ready for discharge on visit 12 as planned as she will have been educated on the activity that she needs to do at home.  Added sit to stand, functional squat and lunges to HEP this session.    Examination-Activity Limitations Caring for Others;Carry;Dressing;Lift;Locomotion  Level;Sit;Squat;Stairs;Stand;Toileting    Examination-Participation Restrictions Cleaning;Community Activity;Laundry;Meal Prep;Shop;Yard Work    Stability/Clinical Decision Making Stable/Uncomplicated    Rehab Potential Good    PT Frequency 2x / week    PT Duration 6 weeks    PT Treatment/Interventions Manual techniques;Patient/family education;Stair training;Gait training;Functional mobility training;Balance training;Therapeutic exercise;Therapeutic activities    PT Next Visit Plan Promote confidence for discharge at visit 12.  Continue core and LE strengthening exercises; progress strengthening, core and  balance exercises    PT Home Exercise Plan LAQ, ankle pumps while sitting, SLR, bridge.; 04/07/20 - clam, sidelying abduction; prone hip extension and knee flexion, standing heel/toe raise; 7/27 crossover step up, chair pose, step up/down; 8/12; sit to stand , functional squat and lunges           Patient will benefit from skilled therapeutic intervention in order to improve the following deficits and impairments:  Decreased activity tolerance, Decreased balance, Decreased mobility, Decreased range of motion, Difficulty walking, Decreased strength, Pain  Visit Diagnosis: Radiculopathy, lumbosacral region     Problem List Patient Active Problem List   Diagnosis Date Noted  . Sciatica of right side 04/21/2020  . Microvascular angina ([place]) 01/01/2019  . Unstable angina ([place])   . Common migraine with intractable migraine 06/16/2017  . Tremor 11/16/2016  . Trochanteric bursitis of left hip 02/04/2016  . Trochanteric bursitis of both hips 10/31/2015  . Chest pain at rest 06/17/2015  . Inferior myocardial infarction ([place]) 06/17/2015  . Sprain of interphalangeal  joint of left little finger 02/05/2014  . Pain in joint, hand 02/05/2014  . Midsternal chest pain 06/04/2013  . CAD (coronary artery disease)   . Fibromyalgia   . GERD (gastroesophageal reflux disease)   . Migraine headache    . Acute non-STEMI < 8 weeks prior, subsequent admission after initial 01/16/2013  . Radial nerve laceration 03/27/2012  . Lack of coordination 03/27/2012  . Muscle weakness (generalized) 03/27/2012    Rayetta Humphrey, PT CLT [phone] 04/30/2020, 2:49 PM  [facility] [address] [place], [name], [address] Phone: [phone]   Fax:  [phone]  Name: Stacey Lawrence MRN: [id number] Date of Birth: 09/23/1950

## 2020-05-05 ENCOUNTER — Other Ambulatory Visit: Payer: Self-pay

## 2020-05-05 ENCOUNTER — Encounter (HOSPITAL_COMMUNITY): Payer: Self-pay | Admitting: Physical Therapy

## 2020-05-05 ENCOUNTER — Ambulatory Visit (HOSPITAL_COMMUNITY): Payer: Medicare Other | Admitting: Physical Therapy

## 2020-05-05 DIAGNOSIS — M5417 Radiculopathy, lumbosacral region: Secondary | ICD-10-CM

## 2020-05-05 NOTE — Therapy (Signed)
[facility] [address] [place], [name], [address] Phone: [phone]   Fax:  [phone]  Physical Therapy Treatment And Discharge Note  Patient Details  Name: Stacey Lawrence MRN: [id number] Date of Birth: 1951/08/03 Referring Provider (PT): Levy Pupa  PHYSICAL THERAPY DISCHARGE SUMMARY  Visits from Start of Care:10  Current functional level related to goals / functional outcomes: See below   Remaining deficits: Continued pain    Education / Equipment: See below Plan: Patient agrees to discharge.  Patient goals were partially met. Patient is being discharged due to being pleased with the current functional level.  ?????       Encounter Date: 05/05/2020   PT End of Session - 05/05/20 1400    Visit Number 10    Number of Visits 12    Date for PT Re-Evaluation 05/19/20    Authorization Type medicare    Progress Note Due on Visit 12    PT Start Time 1400    PT Stop Time 1438    PT Time Calculation (min) 38 min    Activity Tolerance Patient tolerated treatment well;Patient limited by pain;No increased pain    Behavior During Therapy WFL for tasks assessed/performed           Past Medical History:  Diagnosis Date  . Anemia    hx  . Anxiety   . CAD (coronary artery disease)    a. 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 05/2013 Lexi CL: EF 77%, old small inferolat scar, no ischemia.  . Common migraine with intractable migraine 06/16/2017  . Depression   . Fibromyalgia   . GERD (gastroesophageal reflux disease)    hasn't started taking any meds  . History of blood transfusion    223yrago  . History of kidney stones   . Hx of colonic polyps   . IBS (irritable bowel  syndrome)    constipation  . Joint pain   . Joint swelling   . Migraine    "q other week" (02/04/2016)  . [facility] infarction ([facility]  . Numbness    right leg  . Polycythemia   . PONV (postoperative nausea and vomiting)   . Raynaud's disease   . Sciatica of right side 04/21/2020  . Scoliosis   . Shortness of breath dyspnea    with exertion  . SI (sacroiliac) joint dysfunction    right  . Wears glasses     Past Surgical History:  Procedure Laterality Date  . ABDOMINAL HYSTERECTOMY     partial  . APPENDECTOMY     with explor. lap.  . Marland KitchenARDIAC CATHETERIZATION    . CHOLECYSTECTOMY  10/28/2002   lap.  . COLONOSCOPY    . DEBRIDEMENT TENNIS ELBOW    . DIAGNOSTIC LAPAROSCOPY    . DILATION AND CURETTAGE OF UTERUS    . ELBOW SURGERY     golfer's elbow-bilateral  . ESOPHAGOGASTRODUODENOSCOPY  09/10/2012   Procedure: ESOPHAGOGASTRODUODENOSCOPY (EGD);  Surgeon: NaRogene HoustonMD;  [facility];  Service: Endoscopy;  Laterality: N/A;  730  . EXCISION/RELEASE BURSA HIP Left 02/04/2016   Procedure: LEFT HIP TROCHANTERIC BURSECTOMY;  Surgeon: ChMcarthur RossettiMD;  Location:  [facility];  Service: Orthopedics;  Laterality: Left;  . I & D EXTREMITY  02/25/2012   Procedure: IRRIGATION AND DEBRIDEMENT EXTREMITY;  Surgeon: Roseanne Kaufman, MD;  Location: [facility];  Service: Orthopedics;  Laterality: Right;  Irrigation and Debridement and Repair Right Thumb Nerve Laceration and Assoiated Structures   . KNEE DEBRIDEMENT     right  . LAPAROTOMY    . LEFT HEART CATHETERIZATION WITH CORONARY ANGIOGRAM N/A 01/16/2013   Procedure: LEFT HEART CATHETERIZATION WITH CORONARY ANGIOGRAM;  Surgeon: Jolaine Artist, MD;  Location: [facility] CATH LAB;  Service: Cardiovascular;  Laterality: N/A;  . SACROILIAC JOINT FUSION Right 05/02/2019   Procedure: SACROILIAC JOINT FUSION;  Surgeon: Melina Schools, MD;  Location: [facility];  Service: Orthopedics;  Laterality: Right;  SACROILIAC JOINT FUSION  . STAPEDECTOMY      right  . TONSILLECTOMY AND ADENOIDECTOMY    . TRIGGER FINGER RELEASE  08/19/2011   Procedure: RELEASE TRIGGER FINGER/A-1 PULLEY;  Surgeon: Willa Frater III;  Location: [facility];  Service: Orthopedics;  Laterality: Right;  right middle finger a-1 pulley release with tenosynovectomy  . TROCHANTERIC BURSA EXCISION Right 02/04/2016  . TUBAL LIGATION    . TYMPANOPLASTY      There were no vitals filed for this visit.   Subjective Assessment - 05/05/20 1404    Subjective States her sinuses are really bothering her and she hasn't slept for 24 hours. States that she really hasn't slept in the last couple of days. Reports her current pain is 3/10. Reports her vision is blurry as she has pressure on her head from the atm pressure and she feels a migraine is coming on.    Pertinent History fusion of SI, anxiety, CAD, Fibromyalgia, back pain, knee pain    Limitations Reading;Sitting;Standing;Walking;House hold activities;Lifting    How long can you sit comfortably? Pt is able to sit for 30-40 minutes was  30 miinutes    How long can you stand comfortably? 30 minutes was 20-30 minutes    How long can you walk comfortably? 20 mnutes was 15 minutes    Diagnostic tests none    Patient Stated Goals to have less pain and have her leg stronger.  To stop falling    Currently in Pain? Yes    Pain Score 3     Pain Location Back    Pain Onset More than a month ago              [facility] PT Assessment - 05/05/20 0001      Assessment   Medical Diagnosis SI pain     Referring Provider (PT) Levy Pupa    Onset Date/Surgical Date 05/20/19                         [facility] Adult PT Treatment/Exercise - 05/05/20 0001      Ambulation/Gait   Ambulation/Gait Yes    Ambulation Distance (Feet) 1050 Feet    Assistive device None    Stairs Yes    Stair Management Technique One rail Right    Number of Stairs 4    Height of Stairs 7    Gait Comments 5 minutes, continued pain in  back but no increase in pain - able to jog about 10 steps with no pain ; 5 reos of stairs       Knee/Hip Exercises: Standing   Other Standing Knee Exercises semi single leg Sit to stand - elevated surface (blue  and black foam) x5 B  - pain in right hip stopped. SL foot taps 4" step (lateral) 4x5 B UE support     Other Standing Knee Exercises steps 8" no UE 3x10 B                   PT Education - 05/05/20 1442    Education Details on current condition, on progress made, HEP review    Person(s) Educated Patient    Methods Explanation    Comprehension Verbalized understanding            PT Short Term Goals - 04/30/20 1432      PT SHORT TERM GOAL #1   Title PT muscle strength in Rt LE to be increased to a 4/5 to allow pt to be able to come sit to stand without use of UE>    Time 3    Period Weeks    Status Achieved    Target Date 04/08/20      PT SHORT TERM GOAL #2   Title PT to be able to single leg stance for at least 10 seconds to allow decrease number of falls; currently falling 2x /week    Baseline see objective    Time 3    Period Weeks    Status Achieved      PT SHORT TERM GOAL #3   Title PT to be completing her HEP to allow the above to occure.    Time 3    Period Weeks    Status Achieved      PT SHORT TERM GOAL #4   Title Pt pain level to decrease to no greater than a 5/10 to allow pt to sit for an hour at a time for traveling/eating.    Time 3    Period Weeks    Status Partially Met             PT Long Term Goals - 04/30/20 1433      PT LONG TERM GOAL #1   Title PT mm strength in her core and Rt LE  to be at least 4+/5 to be able to get up from a squatted position    Time 6    Period Weeks    Status Partially Met      PT LONG TERM GOAL #2   Title PT to be able to single leg stance on each leg for at least 20 seconds to allow pt to have not fallen in the past 2 weeks.    Time 6    Period Weeks    Status Achieved      PT LONG TERM GOAL #3    Title PT to be I in an advance HEP to allow the above to occur    Time 6    Period Weeks    Status Achieved      PT LONG TERM GOAL #4   Title PT pain in her low back and Rt leg to be no greater than a 3/10 to allow pt to sit for an hour and a half for traveling/ watching television.    Time 6    Period Weeks    Status On-going      PT LONG TERM GOAL #5   Title PT to be able to walk for 30 minutes without fear of her right leg going out for shopping and housework dutiesn    Baseline currently about 15 minutes    Time 6  Period Weeks    Status On-going      PT LONG TERM GOAL #6   Title PT to be able to stand for 30 mintues in order to make a meal    Baseline 1.5 hrs making zucchini bread    Time 6    Period Weeks    Status Achieved                 Plan - 05/05/20 1405    Clinical Impression Statement Focused on progressing exercises as tolerated. Fatigue noted. Able to walk for 5 minutes and take couple of jogging steps without pain or knee giving way. Reviewed exercises and answered all questions. Patient is doing very well, reviewed HEP and progress made so far. Discussed current presentation and patient agreed to discharge today secondary to strong HEP and progress made.    Examination-Activity Limitations Caring for Others;Carry;Dressing;Lift;Locomotion Level;Sit;Squat;Stairs;Stand;Toileting    Examination-Participation Restrictions Cleaning;Community Activity;Laundry;Meal Prep;Shop;Yard Work    Stability/Clinical Decision Making Stable/Uncomplicated    Rehab Potential Good    PT Frequency 2x / week    PT Duration 6 weeks    PT Treatment/Interventions Manual techniques;Patient/family education;Stair training;Gait training;Functional mobility training;Balance training;Therapeutic exercise;Therapeutic activities    PT Next Visit Plan pt to discharge to [facility], ankle pumps while sitting, SLR, bridge.; 04/07/20 - clam, sidelying abduction; prone  hip extension and knee flexion, standing heel/toe raise; 7/27 crossover step up, chair pose, step up/down; 8/12; sit to stand , functional squat and lunges           Patient will benefit from skilled therapeutic intervention in order to improve the following deficits and impairments:  Decreased activity tolerance, Decreased balance, Decreased mobility, Decreased range of motion, Difficulty walking, Decreased strength, Pain  Visit Diagnosis: Radiculopathy, lumbosacral region     Problem List Patient Active Problem List   Diagnosis Date Noted  . Sciatica of right side 04/21/2020  . Microvascular angina ([place]) 01/01/2019  . Unstable angina ([place])   . Common migraine with intractable migraine 06/16/2017  . Tremor 11/16/2016  . Trochanteric bursitis of left hip 02/04/2016  . Trochanteric bursitis of both hips 10/31/2015  . Chest pain at rest 06/17/2015  . Inferior myocardial infarction ([place]) 06/17/2015  . Sprain of interphalangeal joint of left little finger 02/05/2014  . Pain in joint, hand 02/05/2014  . Midsternal chest pain 06/04/2013  . CAD (coronary artery disease)   . Fibromyalgia   . GERD (gastroesophageal reflux disease)   . Migraine headache   . Acute non-STEMI < 8 weeks prior, subsequent admission after initial 01/16/2013  . Radial nerve laceration 03/27/2012  . Lack of coordination 03/27/2012  . Muscle weakness (generalized) 03/27/2012   2:45 PM, 05/05/20 Jerene Pitch, DPT Physical Therapy with [facility]  [phone] office  [facility] [address] [place], [name], [address] Phone: [phone]   Fax:  [phone]  Name: Stacey Lawrence MRN: [id number] Date of Birth: 1950-12-08

## 2020-05-07 ENCOUNTER — Encounter (HOSPITAL_COMMUNITY): Payer: Medicare Other | Admitting: Physical Therapy

## 2020-05-08 ENCOUNTER — Ambulatory Visit: Payer: Medicare Other | Admitting: Cardiovascular Disease

## 2020-05-12 ENCOUNTER — Encounter (HOSPITAL_COMMUNITY): Payer: Medicare Other | Admitting: Physical Therapy

## 2020-05-13 DIAGNOSIS — M5441 Lumbago with sciatica, right side: Secondary | ICD-10-CM | POA: Diagnosis not present

## 2020-05-13 DIAGNOSIS — M9902 Segmental and somatic dysfunction of thoracic region: Secondary | ICD-10-CM | POA: Diagnosis not present

## 2020-05-13 DIAGNOSIS — M9903 Segmental and somatic dysfunction of lumbar region: Secondary | ICD-10-CM | POA: Diagnosis not present

## 2020-05-13 DIAGNOSIS — M9905 Segmental and somatic dysfunction of pelvic region: Secondary | ICD-10-CM | POA: Diagnosis not present

## 2020-05-14 ENCOUNTER — Encounter (HOSPITAL_COMMUNITY): Payer: Medicare Other | Admitting: Physical Therapy

## 2020-05-20 ENCOUNTER — Ambulatory Visit (INDEPENDENT_AMBULATORY_CARE_PROVIDER_SITE_OTHER): Payer: Medicare Other | Admitting: Neurology

## 2020-05-20 ENCOUNTER — Encounter: Payer: Self-pay | Admitting: Neurology

## 2020-05-20 DIAGNOSIS — M5431 Sciatica, right side: Secondary | ICD-10-CM | POA: Diagnosis not present

## 2020-05-20 DIAGNOSIS — M797 Fibromyalgia: Secondary | ICD-10-CM

## 2020-05-20 NOTE — Progress Notes (Addendum)
The patient comes in for EMG nerve conduction study evaluation.  She has sciatica pain on the right.  The EMG shows isolated acute denervation in the vastus lateralis muscle on the right.  MRI of the lumbar spine does show potential for nerve root impingement at the L4, L5, and S1 levels, the patient may have some L4 nerve root involvement therefore.  The patient is followed by Dr. Nelva Bush, I have suggested that she contact him and consider a transforaminal epidural injection on the L4 nerve root to see if this improves her symptoms.  In the past, epidural steroid injections have been beneficial.     MNC    Nerve / Sites Muscle Latency Ref. Amplitude Ref. Rel Amp Segments Distance Velocity Ref. Area    ms ms mV mV %  cm m/s m/s mVms  R Peroneal - EDB     Ankle EDB 4.0 ?6.5 4.5 ?2.0 100 Ankle - EDB 9   10.1     Fib head EDB 9.7  3.9  87.8 Fib head - Ankle 28 50 ?44 9.5     Pop fossa EDB 11.8  3.8  95.7 Pop fossa - Fib head 10 48 ?44 9.0         Pop fossa - Ankle      L Peroneal - EDB     Ankle EDB 3.9 ?6.5 3.8 ?2.0 100 Ankle - EDB 9   9.7     Fib head EDB 9.6  3.0  79.5 Fib head - Ankle 28 49 ?44 9.1     Pop fossa EDB 11.6  2.9  93.9 Pop fossa - Fib head 10 49 ?44 8.7         Pop fossa - Ankle      R Tibial - AH     Ankle AH 3.4 ?5.8 6.9 ?4.0 100 Ankle - AH 9   10.4     Pop fossa AH 11.5  3.6  52.5 Pop fossa - Ankle 38 47 ?41 11.5  L Tibial - AH     Ankle AH 3.7 ?5.8 11.7 ?4.0 100 Ankle - AH 9   18.4     Pop fossa AH 12.1  7.8  66.6 Pop fossa - Ankle 38 45 ?41 18.5             SNC    Nerve / Sites Rec. Site Peak Lat Ref.  Amp Ref. Segments Distance    ms ms V V  cm  R Sural - Ankle (Calf)     Calf Ankle 3.4 ?4.4 8 ?6 Calf - Ankle 14  L Sural - Ankle (Calf)     Calf Ankle 3.4 ?4.4 7 ?6 Calf - Ankle 14  R Superficial peroneal - Ankle     Lat leg Ankle 3.3 ?4.4 8 ?6 Lat leg - Ankle 14  L Superficial peroneal - Ankle     Lat leg Ankle 3.3 ?4.4 6 ?6 Lat leg - Ankle 14             F   Wave    Nerve F Lat Ref.   ms ms  R Tibial - AH 52.5 ?56.0  L Tibial - AH 49.5 ?56.0

## 2020-05-20 NOTE — Procedures (Signed)
     HISTORY:  Stacey Lawrence is a 69 year old patient with a history of right-sided sciatica with pain from the back going down to the right ankle.  The patient has evidence of multilevel neuroforaminal stenosis by MRI of the lumbar spine.  She is being evaluated for a possible lumbosacral radiculopathy.  NERVE CONDUCTION STUDIES:  Nerve conduction studies were performed on both lower extremities. The distal motor latencies and motor amplitudes for the peroneal and posterior tibial nerves were within normal limits. The nerve conduction velocities for these nerves were also normal. The sensory latencies for the peroneal and sural nerves were within normal limits. The F wave latencies for the posterior tibial nerves were within normal limits.   EMG STUDIES:  EMG study was performed on the right lower extremity:  The tibialis anterior muscle reveals 2 to 4K motor units with full recruitment. No fibrillations or positive waves were seen. The peroneus tertius muscle reveals 2 to 4K motor units with full recruitment. No fibrillations or positive waves were seen. The medial gastrocnemius muscle reveals 1 to 3K motor units with full recruitment. No fibrillations or positive waves were seen. The vastus lateralis muscle reveals 2 to 4K motor units with full recruitment. 2+ positive waves were seen. The iliopsoas muscle reveals 2 to 4K motor units with full recruitment. No fibrillations or positive waves were seen. The biceps femoris muscle (long head) reveals 2 to 4K motor units with full recruitment. No fibrillations or positive waves were seen. The lumbosacral paraspinal muscles were tested at 3 levels, and revealed no abnormalities of insertional activity at all 3 levels tested. There was fair relaxation.   IMPRESSION:  Nerve conduction studies done on both lower extremities were within normal limits.  No evidence of a neuropathy was seen.  EMG evaluation of the right lower extremity shows  isolated acute denervation in the vastus lateralis muscle.  Although a lumbosacral radiculopathy cannot be confirmed on the study, the possibility of an L4 radiculopathy should be entertained given MRI lumbar findings.  Jill Alexanders MD 05/20/2020 3:18 PM  [facility] [address] [place], [place] [address]  Phone [phone] Fax [phone]

## 2020-05-20 NOTE — Progress Notes (Signed)
Please refer to EMG and nerve conduction procedure note.  

## 2020-05-28 ENCOUNTER — Other Ambulatory Visit: Payer: Self-pay

## 2020-05-28 ENCOUNTER — Ambulatory Visit (INDEPENDENT_AMBULATORY_CARE_PROVIDER_SITE_OTHER): Payer: Medicare Other | Admitting: Psychiatry

## 2020-05-28 ENCOUNTER — Encounter (HOSPITAL_COMMUNITY): Payer: Self-pay | Admitting: Psychiatry

## 2020-05-28 DIAGNOSIS — F3341 Major depressive disorder, recurrent, in partial remission: Secondary | ICD-10-CM | POA: Diagnosis not present
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
## 2020-06-15 DIAGNOSIS — H6501 Acute serous otitis media, right ear: Secondary | ICD-10-CM | POA: Diagnosis not present

## 2020-06-15 MED FILL — NOREL AD TABLET: 4-10-325 | 20 days supply | Qty: 20 | Fill #0

## 2020-06-15 MED FILL — AMOXICILLIN 875 MG TABS: 875 | 7 days supply | Qty: 14 | Fill #0

## 2020-06-18 ENCOUNTER — Other Ambulatory Visit: Payer: Self-pay

## 2020-06-18 DIAGNOSIS — M797 Fibromyalgia: Secondary | ICD-10-CM

## 2020-06-18 MED ORDER — GABAPENTIN 100 MG PO CAPS: 200.0000 mg | ORAL_CAPSULE | Freq: Two times a day (BID) | ORAL | 1 refills | Status: DC

## 2020-06-18 MED FILL — GABAPENTIN 100 MG CAPSULE: 100 | 67 days supply | Qty: 270 | Fill #0

## 2020-06-30 MED FILL — DILTIAZEM HCL ER COATED BEA: 120 | 90 days supply | Qty: 90 | Fill #3

## 2020-07-15 DIAGNOSIS — M7981 Nontraumatic hematoma of soft tissue: Secondary | ICD-10-CM | POA: Diagnosis not present

## 2020-07-15 DIAGNOSIS — M79645 Pain in left finger(s): Secondary | ICD-10-CM | POA: Diagnosis not present

## 2020-07-15 DIAGNOSIS — M1812 Unilateral primary osteoarthritis of first carpometacarpal joint, left hand: Secondary | ICD-10-CM | POA: Diagnosis not present

## 2020-07-15 DIAGNOSIS — M79644 Pain in right finger(s): Secondary | ICD-10-CM | POA: Diagnosis not present
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
## 2020-07-28 ENCOUNTER — Telehealth (HOSPITAL_COMMUNITY): Payer: Medicare Other | Admitting: Psychiatry

## 2020-07-28 ENCOUNTER — Telehealth (INDEPENDENT_AMBULATORY_CARE_PROVIDER_SITE_OTHER): Payer: Medicare Other | Admitting: Psychiatry

## 2020-07-28 ENCOUNTER — Other Ambulatory Visit: Payer: Self-pay | Admitting: Psychiatry

## 2020-07-28 ENCOUNTER — Other Ambulatory Visit: Payer: Self-pay

## 2020-07-28 ENCOUNTER — Encounter: Payer: Self-pay | Admitting: Psychiatry

## 2020-07-28 DIAGNOSIS — F3341 Major depressive disorder, recurrent, in partial remission: Secondary | ICD-10-CM

## 2020-07-28 MED ORDER — DULOXETINE HCL 30 MG PO CPEP: ORAL_CAPSULE | ORAL | 0 refills | Status: DC

## 2020-07-28 MED ORDER — DULOXETINE HCL 60 MG PO CPEP: 60.0000 mg | ORAL_CAPSULE | Freq: Every day | ORAL | 0 refills | Status: DC

## 2020-07-28 MED FILL — DULoxetine HCL 30 MG CPEP: 30 | 90 days supply | Qty: 90 | Fill #0

## 2020-07-28 MED FILL — DULOXETINE HCL 60 MG CPEP: 60 | 90 days supply | Qty: 90 | Fill #0

## 2020-07-28 NOTE — Patient Instructions (Signed)
1.Continueduloxetine 90 mg(60 mg + 30 mg)daily  2. Discontinue doxepine 3. Next appointment- 2/8 at 8:40

## 2020-08-19 ENCOUNTER — Other Ambulatory Visit (HOSPITAL_COMMUNITY): Payer: Self-pay | Admitting: Obstetrics and Gynecology

## 2020-08-19 DIAGNOSIS — Z1231 Encounter for screening mammogram for malignant neoplasm of breast: Secondary | ICD-10-CM

## 2020-08-21 DIAGNOSIS — D751 Secondary polycythemia: Secondary | ICD-10-CM | POA: Diagnosis not present

## 2020-08-21 DIAGNOSIS — R7301 Impaired fasting glucose: Secondary | ICD-10-CM | POA: Diagnosis not present

## 2020-08-21 DIAGNOSIS — N390 Urinary tract infection, site not specified: Secondary | ICD-10-CM | POA: Diagnosis not present

## 2020-08-21 DIAGNOSIS — J060 Acute laryngopharyngitis: Secondary | ICD-10-CM | POA: Diagnosis not present

## 2020-08-21 DIAGNOSIS — L089 Local infection of the skin and subcutaneous tissue, unspecified: Secondary | ICD-10-CM | POA: Diagnosis not present

## 2020-08-21 DIAGNOSIS — F39 Unspecified mood [affective] disorder: Secondary | ICD-10-CM | POA: Diagnosis not present

## 2020-08-21 DIAGNOSIS — K581 Irritable bowel syndrome with constipation: Secondary | ICD-10-CM | POA: Diagnosis not present

## 2020-08-21 DIAGNOSIS — M461 Sacroiliitis, not elsewhere classified: Secondary | ICD-10-CM | POA: Diagnosis not present

## 2020-08-21 DIAGNOSIS — M5136 Other intervertebral disc degeneration, lumbar region: Secondary | ICD-10-CM | POA: Diagnosis not present

## 2020-08-21 DIAGNOSIS — H6501 Acute serous otitis media, right ear: Secondary | ICD-10-CM | POA: Diagnosis not present

## 2020-08-21 DIAGNOSIS — Z23 Encounter for immunization: Secondary | ICD-10-CM | POA: Diagnosis not present

## 2020-08-21 DIAGNOSIS — Z6824 Body mass index (BMI) 24.0-24.9, adult: Secondary | ICD-10-CM | POA: Diagnosis not present

## 2020-08-21 DIAGNOSIS — R42 Dizziness and giddiness: Secondary | ICD-10-CM | POA: Diagnosis not present

## 2020-08-26 ENCOUNTER — Other Ambulatory Visit: Payer: Self-pay

## 2020-08-26 ENCOUNTER — Ambulatory Visit (HOSPITAL_COMMUNITY)
Admission: RE | Admit: 2020-08-26 | Discharge: 2020-08-26 | Disposition: A | Discharge status: Home / Self Care | Payer: Medicare Other | Source: Ambulatory Visit | Attending: Obstetrics and Gynecology | Admitting: Obstetrics and Gynecology

## 2020-08-26 DIAGNOSIS — Z1231 Encounter for screening mammogram for malignant neoplasm of breast: Secondary | ICD-10-CM | POA: Insufficient documentation

## 2020-09-15 ENCOUNTER — Other Ambulatory Visit (HOSPITAL_COMMUNITY): Payer: Self-pay | Admitting: Family Medicine

## 2020-09-15 DIAGNOSIS — M9902 Segmental and somatic dysfunction of thoracic region: Secondary | ICD-10-CM | POA: Diagnosis not present

## 2020-09-15 DIAGNOSIS — M5136 Other intervertebral disc degeneration, lumbar region: Secondary | ICD-10-CM | POA: Diagnosis not present

## 2020-09-15 DIAGNOSIS — R7301 Impaired fasting glucose: Secondary | ICD-10-CM | POA: Diagnosis not present

## 2020-09-15 DIAGNOSIS — L089 Local infection of the skin and subcutaneous tissue, unspecified: Secondary | ICD-10-CM | POA: Diagnosis not present

## 2020-09-15 DIAGNOSIS — Z6824 Body mass index (BMI) 24.0-24.9, adult: Secondary | ICD-10-CM | POA: Diagnosis not present

## 2020-09-15 DIAGNOSIS — M9903 Segmental and somatic dysfunction of lumbar region: Secondary | ICD-10-CM | POA: Diagnosis not present

## 2020-09-15 DIAGNOSIS — M9905 Segmental and somatic dysfunction of pelvic region: Secondary | ICD-10-CM | POA: Diagnosis not present

## 2020-09-15 DIAGNOSIS — M62830 Muscle spasm of back: Secondary | ICD-10-CM | POA: Diagnosis not present

## 2020-09-15 DIAGNOSIS — F39 Unspecified mood [affective] disorder: Secondary | ICD-10-CM | POA: Diagnosis not present

## 2020-09-15 DIAGNOSIS — N390 Urinary tract infection, site not specified: Secondary | ICD-10-CM | POA: Diagnosis not present

## 2020-09-15 DIAGNOSIS — M461 Sacroiliitis, not elsewhere classified: Secondary | ICD-10-CM | POA: Diagnosis not present

## 2020-09-15 DIAGNOSIS — J060 Acute laryngopharyngitis: Secondary | ICD-10-CM | POA: Diagnosis not present

## 2020-09-15 DIAGNOSIS — K581 Irritable bowel syndrome with constipation: Secondary | ICD-10-CM | POA: Diagnosis not present

## 2020-09-15 DIAGNOSIS — D751 Secondary polycythemia: Secondary | ICD-10-CM | POA: Diagnosis not present

## 2020-09-15 DIAGNOSIS — R42 Dizziness and giddiness: Secondary | ICD-10-CM | POA: Diagnosis not present

## 2020-09-15 DIAGNOSIS — H6501 Acute serous otitis media, right ear: Secondary | ICD-10-CM | POA: Diagnosis not present

## 2020-09-15 MED FILL — NITROFURANTOIN MONO-MCR 100: 100 | 7 days supply | Qty: 14 | Fill #0

## 2020-09-18 DIAGNOSIS — Z23 Encounter for immunization: Secondary | ICD-10-CM | POA: Diagnosis not present

## 2020-09-30 DIAGNOSIS — H9313 Tinnitus, bilateral: Secondary | ICD-10-CM | POA: Diagnosis not present

## 2020-09-30 DIAGNOSIS — H903 Sensorineural hearing loss, bilateral: Secondary | ICD-10-CM | POA: Diagnosis not present

## 2020-09-30 DIAGNOSIS — H9201 Otalgia, right ear: Secondary | ICD-10-CM | POA: Diagnosis not present

## 2020-09-30 DIAGNOSIS — R42 Dizziness and giddiness: Secondary | ICD-10-CM | POA: Diagnosis not present

## 2020-10-12 DIAGNOSIS — M62830 Muscle spasm of back: Secondary | ICD-10-CM | POA: Diagnosis not present

## 2020-10-12 DIAGNOSIS — M9905 Segmental and somatic dysfunction of pelvic region: Secondary | ICD-10-CM | POA: Diagnosis not present

## 2020-10-12 DIAGNOSIS — M9902 Segmental and somatic dysfunction of thoracic region: Secondary | ICD-10-CM | POA: Diagnosis not present

## 2020-10-12 DIAGNOSIS — M9903 Segmental and somatic dysfunction of lumbar region: Secondary | ICD-10-CM | POA: Diagnosis not present
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
## 2020-10-27 ENCOUNTER — Telehealth (INDEPENDENT_AMBULATORY_CARE_PROVIDER_SITE_OTHER): Payer: Medicare HMO | Admitting: Psychiatry

## 2020-10-27 ENCOUNTER — Encounter: Payer: Self-pay | Admitting: Psychiatry

## 2020-10-27 ENCOUNTER — Other Ambulatory Visit: Payer: Self-pay

## 2020-10-27 ENCOUNTER — Other Ambulatory Visit: Payer: Self-pay | Admitting: Psychiatry

## 2020-10-27 DIAGNOSIS — F3341 Major depressive disorder, recurrent, in partial remission: Secondary | ICD-10-CM | POA: Diagnosis not present

## 2020-10-27 MED ORDER — DULOXETINE HCL 30 MG PO CPEP: ORAL_CAPSULE | ORAL | 0 refills | Status: DC

## 2020-10-27 MED ORDER — DULOXETINE HCL 60 MG PO CPEP: 60.0000 mg | ORAL_CAPSULE | Freq: Every day | ORAL | 0 refills | Status: DC

## 2020-10-27 MED FILL — DULoxetine HCL 30 MG CPEP: 30 | 90 days supply | Qty: 90 | Fill #0

## 2020-10-27 MED FILL — DULOXETINE HCL 60 MG CPEP: 60 | 90 days supply | Qty: 90 | Fill #0

## 2020-11-12 ENCOUNTER — Other Ambulatory Visit: Payer: Self-pay

## 2020-11-12 MED ORDER — DILTIAZEM HCL ER COATED BEADS 120 MG PO CP24: 120.0000 mg | ORAL_CAPSULE | Freq: Every day | ORAL | 0 refills | Status: DC

## 2020-11-12 MED FILL — DILTIAZEM HCL ER COATED BEA: 120 | 90 days supply | Qty: 90 | Fill #0

## 2020-11-24 DIAGNOSIS — Z01419 Encounter for gynecological examination (general) (routine) without abnormal findings: Secondary | ICD-10-CM | POA: Diagnosis not present

## 2020-11-26 ENCOUNTER — Other Ambulatory Visit: Payer: Self-pay | Admitting: Obstetrics and Gynecology

## 2020-11-26 DIAGNOSIS — M8580 Other specified disorders of bone density and structure, unspecified site: Secondary | ICD-10-CM

## 2021-01-11 DIAGNOSIS — M9901 Segmental and somatic dysfunction of cervical region: Secondary | ICD-10-CM | POA: Diagnosis not present

## 2021-01-11 DIAGNOSIS — M9903 Segmental and somatic dysfunction of lumbar region: Secondary | ICD-10-CM | POA: Diagnosis not present

## 2021-01-11 DIAGNOSIS — M546 Pain in thoracic spine: Secondary | ICD-10-CM | POA: Diagnosis not present

## 2021-01-11 DIAGNOSIS — M542 Cervicalgia: Secondary | ICD-10-CM | POA: Diagnosis not present

## 2021-01-11 DIAGNOSIS — M9902 Segmental and somatic dysfunction of thoracic region: Secondary | ICD-10-CM | POA: Diagnosis not present

## 2021-01-11 DIAGNOSIS — M62830 Muscle spasm of back: Secondary | ICD-10-CM | POA: Diagnosis not present
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
## 2021-01-25 ENCOUNTER — Encounter: Payer: Self-pay | Admitting: Psychiatry

## 2021-01-25 ENCOUNTER — Telehealth (INDEPENDENT_AMBULATORY_CARE_PROVIDER_SITE_OTHER): Payer: Medicare HMO | Admitting: Psychiatry

## 2021-01-25 ENCOUNTER — Other Ambulatory Visit (HOSPITAL_COMMUNITY): Payer: Self-pay

## 2021-01-25 ENCOUNTER — Other Ambulatory Visit: Payer: Self-pay

## 2021-01-25 DIAGNOSIS — F331 Major depressive disorder, recurrent, moderate: Secondary | ICD-10-CM

## 2021-01-25 MED ORDER — DULOXETINE HCL 30 MG PO CPEP
ORAL_CAPSULE | ORAL | 0 refills | Status: DC
  Filled 2021-01-25 – 2021-02-03 (×2): qty 90, 90d supply, fill #0

## 2021-01-25 MED ORDER — DULOXETINE HCL 60 MG PO CPEP
ORAL_CAPSULE | ORAL | 0 refills | Status: DC
  Filled 2021-01-25 – 2021-02-03 (×2): qty 90, 90d supply, fill #0

## 2021-01-25 NOTE — Patient Instructions (Signed)
1.Continueduloxetine 90 mg(60 mg + 30 mg)daily  2. Next appointment- 8/8 at 8:40

## 2021-01-28 ENCOUNTER — Other Ambulatory Visit (HOSPITAL_COMMUNITY): Payer: Self-pay

## 2021-01-28 MED ORDER — GABAPENTIN 100 MG PO CAPS
200.0000 mg | ORAL_CAPSULE | Freq: Two times a day (BID) | ORAL | 0 refills | Status: DC
  Filled 2021-01-28: qty 270, 67d supply, fill #0

## 2021-01-29 DIAGNOSIS — M797 Fibromyalgia: Secondary | ICD-10-CM | POA: Diagnosis not present

## 2021-01-29 DIAGNOSIS — F332 Major depressive disorder, recurrent severe without psychotic features: Secondary | ICD-10-CM | POA: Diagnosis not present

## 2021-01-29 DIAGNOSIS — Z0000 Encounter for general adult medical examination without abnormal findings: Secondary | ICD-10-CM | POA: Diagnosis not present

## 2021-01-29 DIAGNOSIS — G43009 Migraine without aura, not intractable, without status migrainosus: Secondary | ICD-10-CM | POA: Diagnosis not present

## 2021-01-29 DIAGNOSIS — R251 Tremor, unspecified: Secondary | ICD-10-CM | POA: Diagnosis not present

## 2021-01-29 DIAGNOSIS — R7301 Impaired fasting glucose: Secondary | ICD-10-CM | POA: Diagnosis not present

## 2021-02-01 ENCOUNTER — Other Ambulatory Visit (HOSPITAL_COMMUNITY): Payer: Self-pay

## 2021-02-03 ENCOUNTER — Other Ambulatory Visit (HOSPITAL_COMMUNITY): Payer: Self-pay

## 2021-02-05 ENCOUNTER — Other Ambulatory Visit (HOSPITAL_COMMUNITY): Payer: Self-pay

## 2021-02-09 ENCOUNTER — Encounter (INDEPENDENT_AMBULATORY_CARE_PROVIDER_SITE_OTHER): Payer: Self-pay | Admitting: *Deleted

## 2021-03-08 ENCOUNTER — Other Ambulatory Visit: Payer: Self-pay | Admitting: Student

## 2021-03-08 ENCOUNTER — Other Ambulatory Visit (HOSPITAL_COMMUNITY): Payer: Self-pay

## 2021-03-08 MED ORDER — DILTIAZEM HCL ER COATED BEADS 120 MG PO CP24
ORAL_CAPSULE | Freq: Every day | ORAL | 0 refills | Status: DC
  Filled 2021-03-08 – 2021-03-16 (×2): qty 90, 90d supply, fill #0

## 2021-03-16 ENCOUNTER — Other Ambulatory Visit (HOSPITAL_COMMUNITY): Payer: Self-pay

## 2021-03-23 DIAGNOSIS — M62830 Muscle spasm of back: Secondary | ICD-10-CM | POA: Diagnosis not present

## 2021-03-23 DIAGNOSIS — M542 Cervicalgia: Secondary | ICD-10-CM | POA: Diagnosis not present

## 2021-03-23 DIAGNOSIS — M9901 Segmental and somatic dysfunction of cervical region: Secondary | ICD-10-CM | POA: Diagnosis not present

## 2021-03-23 DIAGNOSIS — M9903 Segmental and somatic dysfunction of lumbar region: Secondary | ICD-10-CM | POA: Diagnosis not present

## 2021-03-23 DIAGNOSIS — M546 Pain in thoracic spine: Secondary | ICD-10-CM | POA: Diagnosis not present

## 2021-03-23 DIAGNOSIS — M9902 Segmental and somatic dysfunction of thoracic region: Secondary | ICD-10-CM | POA: Diagnosis not present
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
## 2021-04-26 ENCOUNTER — Telehealth (INDEPENDENT_AMBULATORY_CARE_PROVIDER_SITE_OTHER): Payer: Medicare HMO | Admitting: Psychiatry

## 2021-04-26 ENCOUNTER — Other Ambulatory Visit: Payer: Self-pay

## 2021-04-26 ENCOUNTER — Encounter: Payer: Self-pay | Admitting: Psychiatry

## 2021-04-26 ENCOUNTER — Other Ambulatory Visit (HOSPITAL_COMMUNITY): Payer: Self-pay

## 2021-04-26 DIAGNOSIS — F330 Major depressive disorder, recurrent, mild: Secondary | ICD-10-CM

## 2021-04-26 MED ORDER — DULOXETINE HCL 60 MG PO CPEP
120.0000 mg | ORAL_CAPSULE | Freq: Every day | ORAL | 0 refills | Status: DC
  Filled 2021-04-26: qty 180, 90d supply, fill #0

## 2021-04-26 NOTE — Patient Instructions (Signed)
Increase duloxetine 120 mg daily  2. Next appointment- 11/3 at 1:20

## 2021-04-30 ENCOUNTER — Other Ambulatory Visit (HOSPITAL_COMMUNITY): Payer: Self-pay

## 2021-04-30 DIAGNOSIS — N390 Urinary tract infection, site not specified: Secondary | ICD-10-CM | POA: Diagnosis not present

## 2021-04-30 DIAGNOSIS — M9901 Segmental and somatic dysfunction of cervical region: Secondary | ICD-10-CM | POA: Diagnosis not present

## 2021-04-30 DIAGNOSIS — M62830 Muscle spasm of back: Secondary | ICD-10-CM | POA: Diagnosis not present

## 2021-04-30 DIAGNOSIS — M9902 Segmental and somatic dysfunction of thoracic region: Secondary | ICD-10-CM | POA: Diagnosis not present

## 2021-04-30 DIAGNOSIS — M9903 Segmental and somatic dysfunction of lumbar region: Secondary | ICD-10-CM | POA: Diagnosis not present

## 2021-04-30 DIAGNOSIS — M546 Pain in thoracic spine: Secondary | ICD-10-CM | POA: Diagnosis not present

## 2021-04-30 DIAGNOSIS — M542 Cervicalgia: Secondary | ICD-10-CM | POA: Diagnosis not present

## 2021-04-30 MED ORDER — CIPROFLOXACIN HCL 500 MG PO TABS
500.0000 mg | ORAL_TABLET | Freq: Two times a day (BID) | ORAL | 0 refills | Status: DC
  Filled 2021-04-30: qty 10, 5d supply, fill #0
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
## 2021-05-25 ENCOUNTER — Telehealth: Payer: Medicare HMO | Admitting: Psychiatry

## 2021-05-25 ENCOUNTER — Other Ambulatory Visit: Payer: Self-pay

## 2021-05-25 ENCOUNTER — Telehealth: Payer: Self-pay | Admitting: Psychiatry

## 2021-05-25 NOTE — Telephone Encounter (Signed)
Called the patient twice for appointment scheduled today. The patient did not answer the phone. No option to leave a voice message.  

## 2021-06-02 ENCOUNTER — Other Ambulatory Visit: Payer: Self-pay

## 2021-06-02 ENCOUNTER — Ambulatory Visit
Admission: RE | Admit: 2021-06-02 | Discharge: 2021-06-02 | Disposition: A | Discharge status: Home / Self Care | Payer: Medicare HMO | Source: Ambulatory Visit | Attending: Obstetrics and Gynecology | Admitting: Obstetrics and Gynecology

## 2021-06-02 DIAGNOSIS — Z780 Asymptomatic menopausal state: Secondary | ICD-10-CM | POA: Diagnosis not present

## 2021-06-02 DIAGNOSIS — M8589 Other specified disorders of bone density and structure, multiple sites: Secondary | ICD-10-CM | POA: Diagnosis not present

## 2021-06-02 DIAGNOSIS — M8580 Other specified disorders of bone density and structure, unspecified site: Secondary | ICD-10-CM

## 2021-06-03 ENCOUNTER — Other Ambulatory Visit (HOSPITAL_COMMUNITY): Payer: Self-pay

## 2021-06-03 ENCOUNTER — Other Ambulatory Visit: Payer: Self-pay | Admitting: Student

## 2021-06-03 MED ORDER — DILTIAZEM HCL ER COATED BEADS 120 MG PO CP24
ORAL_CAPSULE | Freq: Every day | ORAL | 0 refills | Status: DC
  Filled 2021-06-03 – 2021-06-16 (×2): qty 90, 90d supply, fill #0

## 2021-06-03 NOTE — Telephone Encounter (Signed)
This is a [place] pt.  °

## 2021-06-07 ENCOUNTER — Other Ambulatory Visit (HOSPITAL_COMMUNITY): Payer: Self-pay

## 2021-06-08 ENCOUNTER — Other Ambulatory Visit (HOSPITAL_COMMUNITY): Payer: Self-pay

## 2021-06-09 ENCOUNTER — Other Ambulatory Visit (HOSPITAL_COMMUNITY): Payer: Self-pay

## 2021-06-10 ENCOUNTER — Other Ambulatory Visit (HOSPITAL_COMMUNITY): Payer: Self-pay

## 2021-06-11 ENCOUNTER — Other Ambulatory Visit (HOSPITAL_COMMUNITY): Payer: Self-pay

## 2021-06-15 ENCOUNTER — Other Ambulatory Visit (HOSPITAL_COMMUNITY): Payer: Self-pay

## 2021-06-16 ENCOUNTER — Other Ambulatory Visit: Payer: Self-pay | Admitting: Student

## 2021-06-16 ENCOUNTER — Other Ambulatory Visit: Payer: Self-pay

## 2021-06-16 ENCOUNTER — Other Ambulatory Visit (HOSPITAL_COMMUNITY): Payer: Self-pay

## 2021-06-21 ENCOUNTER — Other Ambulatory Visit (HOSPITAL_COMMUNITY): Payer: Self-pay

## 2021-06-21 ENCOUNTER — Other Ambulatory Visit: Payer: Self-pay

## 2021-06-24 ENCOUNTER — Other Ambulatory Visit: Payer: Self-pay | Admitting: Family Medicine

## 2021-06-24 ENCOUNTER — Other Ambulatory Visit (HOSPITAL_COMMUNITY): Payer: Self-pay

## 2021-06-30 DIAGNOSIS — M542 Cervicalgia: Secondary | ICD-10-CM | POA: Diagnosis not present

## 2021-06-30 DIAGNOSIS — G43009 Migraine without aura, not intractable, without status migrainosus: Secondary | ICD-10-CM | POA: Diagnosis not present

## 2021-06-30 DIAGNOSIS — M797 Fibromyalgia: Secondary | ICD-10-CM | POA: Diagnosis not present

## 2021-06-30 DIAGNOSIS — F332 Major depressive disorder, recurrent severe without psychotic features: Secondary | ICD-10-CM | POA: Diagnosis not present

## 2021-06-30 DIAGNOSIS — Z23 Encounter for immunization: Secondary | ICD-10-CM | POA: Diagnosis not present

## 2021-06-30 DIAGNOSIS — R195 Other fecal abnormalities: Secondary | ICD-10-CM | POA: Diagnosis not present

## 2021-06-30 DIAGNOSIS — R7301 Impaired fasting glucose: Secondary | ICD-10-CM | POA: Diagnosis not present

## 2021-06-30 DIAGNOSIS — R251 Tremor, unspecified: Secondary | ICD-10-CM | POA: Diagnosis not present

## 2021-07-13 ENCOUNTER — Other Ambulatory Visit: Payer: Self-pay

## 2021-07-13 ENCOUNTER — Other Ambulatory Visit (HOSPITAL_COMMUNITY): Payer: Self-pay

## 2021-07-13 ENCOUNTER — Encounter (INDEPENDENT_AMBULATORY_CARE_PROVIDER_SITE_OTHER): Payer: Self-pay

## 2021-07-13 ENCOUNTER — Other Ambulatory Visit (INDEPENDENT_AMBULATORY_CARE_PROVIDER_SITE_OTHER): Payer: Self-pay

## 2021-07-13 ENCOUNTER — Ambulatory Visit (INDEPENDENT_AMBULATORY_CARE_PROVIDER_SITE_OTHER): Payer: Medicare HMO | Admitting: Internal Medicine

## 2021-07-13 ENCOUNTER — Telehealth (INDEPENDENT_AMBULATORY_CARE_PROVIDER_SITE_OTHER): Payer: Self-pay

## 2021-07-13 ENCOUNTER — Encounter (INDEPENDENT_AMBULATORY_CARE_PROVIDER_SITE_OTHER): Payer: Self-pay | Admitting: Internal Medicine

## 2021-07-13 DIAGNOSIS — Z1211 Encounter for screening for malignant neoplasm of colon: Secondary | ICD-10-CM

## 2021-07-13 DIAGNOSIS — K589 Irritable bowel syndrome without diarrhea: Secondary | ICD-10-CM

## 2021-07-13 MED ORDER — DICYCLOMINE HCL 10 MG PO CAPS
10.0000 mg | ORAL_CAPSULE | Freq: Two times a day (BID) | ORAL | 1 refills | Status: DC
  Filled 2021-07-13: qty 60, 30d supply, fill #0

## 2021-07-13 MED ORDER — PEG 3350-KCL-NA BICARB-NACL 420 G PO SOLR
4000.0000 mL | ORAL | 0 refills | Status: DC
  Filled 2021-07-13: qty 4000, fill #0

## 2021-07-13 NOTE — H&P (View-Only) (Signed)
Presenting complaint;  Change in bowel habits.   History of present illness  Patient is 70 year old Caucasian female who is here for scheduled visit.   She has never been seen in our office before.  She did have EGD by me in December 2013 for epigastric pain and weight loss.  Now she presents with change in her bowel habits.  Her baseline is chronic constipation.  She noted change in her bowel habits about 6 months ago.  She is passing gray stools once or twice a week.  She has some abdominal cramping and urgency.  She also passes stool with urination when she passes flatus.  She denies melena or frank rectal bleeding.  At times she may see blood on tissue.  Her appetite is fair.  She has lost 10 pounds in 6 months.  She feels her weight loss is because she is eating healthy foods.  She walks 2-3 times a week and each time she walks 1 mile. She has a history of GERD.  She says she is able to control heartburn by watching her diet.  She denies dysphagia nausea or vomiting. Patient says she is under a lot of stress.  She is caring for her mother-in-law who has dementia. She states she has had 3 colonoscopies.  She has screening colonoscopy in March 2016 by Dr. Delfin Edis and it was a normal exam.  Her second colonoscopy was in 2010 by Dr. Juanita Craver and she had 3 small polyps and these are hyperplastic.  Her last colonoscopy was about 10 years ago by Dr. Sonia Baller.  She did not have any polyps.   Current Medications: Outpatient Encounter Medications as of 07/13/2021  Medication Sig   Ascorbic Acid (VITAMIN C PO) Take 2 tablets by mouth daily.   Biotin 5 MG CAPS Take 5 mg by mouth daily.    Calcium Citrate-Vitamin D (CALCITRATE/VITAMIN D PO) Take 1 tablet by mouth daily.   diltiazem (CARDIZEM CD) 120 MG 24 hr capsule TAKE 1 CAPSULE BY MOUTH ONCE A DAY   DULoxetine (CYMBALTA) 60 MG capsule Take 2 capsules (120 mg total) by mouth daily.   promethazine (PHENERGAN) 25 MG tablet Take 25 mg by  mouth every 6 (six) hours as needed for nausea or vomiting.   gabapentin (NEURONTIN) 100 MG capsule Take 2 capsules (200 mg total) by mouth 2 (two) times daily. (Patient not taking: Reported on 07/13/2021)   gabapentin (NEURONTIN) 100 MG capsule Take 2 capsules (200 mg total) by mouth 2 (two) times daily. (Patient not taking: Reported on 07/13/2021)   [DISCONTINUED] carisoprodol (SOMA) 350 MG tablet Take 350 mg by mouth daily as needed for muscle spasms.    [DISCONTINUED] ciprofloxacin (CIPRO) 500 MG tablet Take 1 tablet (500 mg total) by mouth every 12 (twelve) hours for 5 days   [DISCONTINUED] nitrofurantoin, macrocrystal-monohydrate, (MACROBID) 100 MG capsule TAKE 1 CAPSULE TWICE A DAY FOR 7 DAYS   No facility-administered encounter medications on file as of 07/13/2021.   Past medical history  History of migraine GERD.  EGD in December 2013 negative for Barrett's esophagus. Anxiety and depression History of kidney stones.  She passed spontaneously. Fibromyalgia. Polycythemia vera.  She requires phlebotomy once or twice a year. Raynaud's disease. Coronary artery disease. Hyperlipidemia. Scoliosis. History of IBS/C. Recurrent urinary tract infection. Colonoscopy in 2006, 2010 and 2013 as above. Right-sided sciatica. History of varicose veins.  Appendectomy at age 55 Tonsillectomy and adenoidectomy at age 62. Tubal ligation in 1987 Abdominal hysterectomy 1992 Right  ear surgery with stapedectomy 1997 Laparoscopic cholecystectomy in 2004 for symptomatic cholelithiasis Bilateral tenderness elbow surgery in 2001 and 2006. 5 interventions for trigger finger release about 10 years ago. Microsurgery on right thumb for injury few years ago. Excision of left hip bursa in May 2017. SI joint fusion in August 2020   Family history  Both parents are diseased.  Mother lived to be 27.  She was apparently diagnosed with lung carcinoma at 37 but fortunately diet she was also felt to have  pancreatic and colon cancer's. Father was diagnosed with lung carcinoma at age 69 and died at age 69. She had 1 brother who also died of lung carcinoma at age 52.  He was diagnosed 8 years earlier.  She has a sister age 21 with Parkinson's disease and enlarged heart.  Social history  She is married.  This is her second marriage.  She has 2 sons ages 7 and 33 from her first marriage or doing well.  She is retired.  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  She does not smoke cigarettes or drink alcohol.  Physical examination  Blood pressure 107/69, pulse 77, temperature 98.8 F (37.1 C), temperature source Oral, height 5\' 7"  (1.702 m), weight 146 lb 9.6 oz (66.5 kg). Patient is alert and in no acute distress.   Conjunctiva is pink. Sclera is nonicteric Oropharyngeal mucosa is normal. No neck masses or thyromegaly noted. Cardiac exam with regular rhythm normal S1 and S2. No murmur or gallop noted. Lungs are clear to auscultation. Abdomen is symmetrical.  She has lower midline scar along with another vertical scar in right lower quadrant of her abdomen.  Bowel sounds are normal.  On palpation abdomen is soft and nontender with organomegaly or masses. No LE edema or clubbing noted.  Labs/studies Results:  No recent lab data on file. Patient states she had normal hemoglobin 6 months ago. Hemoglobin A1c at that time was 6.2.   Assessment:  #1.  Change in bowel habits appear to be secondary to irritable bowel syndrome.  She has noted change in frequency, color as well as urgency and she is also passing stools with her urination.  She does not have fixed caliber change or frank rectal bleeding.  We will trial her on low-dose dicyclomine and see how she does.  #2.  Patient is average risk for colorectal cancer.  She only had hyperplastic polyps removed on her second colonoscopy in 2010.   She is due for screening exam.  Plan:  Dicyclomine 10 mg by mouth before breakfast and lunch.  Patient informed potential side effects such as dry mouth and constipation.  If 2 doses are too much she will just try 1 dose daily. Average risk colonoscopy to be scheduled under monitored anesthesia care in near future. Response to antispasmodic therapy will be assessed at the time of colonoscopy.

## 2021-07-13 NOTE — Patient Instructions (Signed)
Colonoscopy to be scheduled next month. Take dicyclomine 10 mg by mouth 30 minutes before breakfast daily.  If 1 does does not work can take second dose before lunch. Please remember to get a copy of your next blood work for [place] to review.

## 2021-07-13 NOTE — Progress Notes (Signed)
Presenting complaint;  Change in bowel habits.   History of present illness  Patient is 70 year old Caucasian female who is here for scheduled visit.   She has never been seen in our office before.  She did have EGD by me in December 2013 for epigastric pain and weight loss.  Now she presents with change in her bowel habits.  Her baseline is chronic constipation.  She noted change in her bowel habits about 6 months ago.  She is passing gray stools once or twice a week.  She has some abdominal cramping and urgency.  She also passes stool with urination when she passes flatus.  She denies melena or frank rectal bleeding.  At times she may see blood on tissue.  Her appetite is fair.  She has lost 10 pounds in 6 months.  She feels her weight loss is because she is eating healthy foods.  She walks 2-3 times a week and each time she walks 1 mile. She has a history of GERD.  She says she is able to control heartburn by watching her diet.  She denies dysphagia nausea or vomiting. Patient says she is under a lot of stress.  She is caring for her mother-in-law who has dementia. She states she has had 3 colonoscopies.  She has screening colonoscopy in March 2016 by Dr. Delfin Edis and it was a normal exam.  Her second colonoscopy was in 2010 by Dr. Juanita Craver and she had 3 small polyps and these are hyperplastic.  Her last colonoscopy was about 10 years ago by Dr. Sonia Baller.  She did not have any polyps.   Current Medications: Outpatient Encounter Medications as of 07/13/2021  Medication Sig   Ascorbic Acid (VITAMIN C PO) Take 2 tablets by mouth daily.   Biotin 5 MG CAPS Take 5 mg by mouth daily.    Calcium Citrate-Vitamin D (CALCITRATE/VITAMIN D PO) Take 1 tablet by mouth daily.   diltiazem (CARDIZEM CD) 120 MG 24 hr capsule TAKE 1 CAPSULE BY MOUTH ONCE A DAY   DULoxetine (CYMBALTA) 60 MG capsule Take 2 capsules (120 mg total) by mouth daily.   promethazine (PHENERGAN) 25 MG tablet Take 25 mg by  mouth every 6 (six) hours as needed for nausea or vomiting.   gabapentin (NEURONTIN) 100 MG capsule Take 2 capsules (200 mg total) by mouth 2 (two) times daily. (Patient not taking: Reported on 07/13/2021)   gabapentin (NEURONTIN) 100 MG capsule Take 2 capsules (200 mg total) by mouth 2 (two) times daily. (Patient not taking: Reported on 07/13/2021)   [DISCONTINUED] carisoprodol (SOMA) 350 MG tablet Take 350 mg by mouth daily as needed for muscle spasms.    [DISCONTINUED] ciprofloxacin (CIPRO) 500 MG tablet Take 1 tablet (500 mg total) by mouth every 12 (twelve) hours for 5 days   [DISCONTINUED] nitrofurantoin, macrocrystal-monohydrate, (MACROBID) 100 MG capsule TAKE 1 CAPSULE TWICE A DAY FOR 7 DAYS   No facility-administered encounter medications on file as of 07/13/2021.   Past medical history  History of migraine GERD.  EGD in December 2013 negative for Barrett's esophagus. Anxiety and depression History of kidney stones.  She passed spontaneously. Fibromyalgia. Polycythemia vera.  She requires phlebotomy once or twice a year. Raynaud's disease. Coronary artery disease. Hyperlipidemia. Scoliosis. History of IBS/C. Recurrent urinary tract infection. Colonoscopy in 2006, 2010 and 2013 as above. Right-sided sciatica. History of varicose veins.  Appendectomy at age 69 Tonsillectomy and adenoidectomy at age 72. Tubal ligation in 1987 Abdominal hysterectomy 1992 Right  ear surgery with stapedectomy 1997 Laparoscopic cholecystectomy in 2004 for symptomatic cholelithiasis Bilateral tenderness elbow surgery in 2001 and 2006. 5 interventions for trigger finger release about 10 years ago. Microsurgery on right thumb for injury few years ago. Excision of left hip bursa in May 2017. SI joint fusion in August 2020   Family history  Both parents are diseased.  Mother lived to be 50.  She was apparently diagnosed with lung carcinoma at 54 but fortunately diet she was also felt to have  pancreatic and colon cancer's. Father was diagnosed with lung carcinoma at age 13 and died at age 69. She had 1 brother who also died of lung carcinoma at age 51.  He was diagnosed 8 years earlier.  She has a sister age 79 with Parkinson's disease and enlarged heart.  Social history  She is married.  This is her second marriage.  She has 2 sons ages 8 and 32 from her first marriage or doing well.  She is retired.  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  She does not smoke cigarettes or drink alcohol.  Physical examination  Blood pressure 107/69, pulse 77, temperature 98.8 F (37.1 C), temperature source Oral, height 5\' 7"  (1.702 m), weight 146 lb 9.6 oz (66.5 kg). Patient is alert and in no acute distress.   Conjunctiva is pink. Sclera is nonicteric Oropharyngeal mucosa is normal. No neck masses or thyromegaly noted. Cardiac exam with regular rhythm normal S1 and S2. No murmur or gallop noted. Lungs are clear to auscultation. Abdomen is symmetrical.  She has lower midline scar along with another vertical scar in right lower quadrant of her abdomen.  Bowel sounds are normal.  On palpation abdomen is soft and nontender with organomegaly or masses. No LE edema or clubbing noted.  Labs/studies Results:  No recent lab data on file. Patient states she had normal hemoglobin 6 months ago. Hemoglobin A1c at that time was 6.2.   Assessment:  #1.  Change in bowel habits appear to be secondary to irritable bowel syndrome.  She has noted change in frequency, color as well as urgency and she is also passing stools with her urination.  She does not have fixed caliber change or frank rectal bleeding.  We will trial her on low-dose dicyclomine and see how she does.  #2.  Patient is average risk for colorectal cancer.  She only had hyperplastic polyps removed on her second colonoscopy in 2010.   She is due for screening exam.  Plan:  Dicyclomine 10 mg by mouth before breakfast and lunch.  Patient informed potential side effects such as dry mouth and constipation.  If 2 doses are too much she will just try 1 dose daily. Average risk colonoscopy to be scheduled under monitored anesthesia care in near future. Response to antispasmodic therapy will be assessed at the time of colonoscopy.

## 2021-07-13 NOTE — Telephone Encounter (Signed)
Stacey Lawrence, CMA  

## 2021-07-14 ENCOUNTER — Encounter (INDEPENDENT_AMBULATORY_CARE_PROVIDER_SITE_OTHER): Payer: Self-pay

## 2021-07-22 ENCOUNTER — Encounter: Payer: Self-pay | Admitting: Psychiatry

## 2021-07-22 ENCOUNTER — Telehealth (INDEPENDENT_AMBULATORY_CARE_PROVIDER_SITE_OTHER): Payer: Medicare HMO | Admitting: Psychiatry

## 2021-07-22 ENCOUNTER — Other Ambulatory Visit: Payer: Self-pay

## 2021-07-22 DIAGNOSIS — F330 Major depressive disorder, recurrent, mild: Secondary | ICD-10-CM
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
## 2021-07-30 DIAGNOSIS — R7301 Impaired fasting glucose: Secondary | ICD-10-CM | POA: Diagnosis not present

## 2021-08-03 ENCOUNTER — Ambulatory Visit: Payer: Medicare HMO | Admitting: Neurology

## 2021-08-03 ENCOUNTER — Encounter: Payer: Self-pay | Admitting: Neurology

## 2021-08-03 VITALS — BP 105/66 | HR 81 | Ht 67.0 in | Wt 145.0 lb

## 2021-08-03 DIAGNOSIS — R269 Unspecified abnormalities of gait and mobility: Secondary | ICD-10-CM | POA: Diagnosis not present

## 2021-08-03 NOTE — Progress Notes (Signed)
Chief Complaint  Patient presents with   New Patient (Initial Visit)    Rm 14, alone, c/o headaches and dizziness       ASSESSMENT AND PLAN  Stacey Lawrence is a 70 y.o. female   Gait abnormalities Chronic neck pain Worsening urinary urgency frequency  Hyperreflexia on examination  MRI of cervical spine to rule out cervical spondylitic myelopathy  Refer to physical therapy   DIAGNOSTIC DATA (LABS, IMAGING, TESTING) - I reviewed patient records, labs, notes, testing and imaging myself where available.   MEDICAL HISTORY:  Stacey Lawrence is a 70 year old female, seen in request by primary care physician Dr. Nevada Crane, [place], evaluation of gait abnormality, initial evaluation was on August 03, 2021  I reviewed and summarized the referring note. PMHX. CAD Depression, Anxiety Chronic migraine.  She had a history of chronic neck pain, is a retired Scientist, research (life sciences) for 33 years, since beginning of 2022, she noticed unbalanced gait, especially when she suddenly change positions, tends to bump into the door frame, fell few times  In addition, she noticed worsening bilateral hands paresthesia, especially first 2 fingers, weak grip, denies toe paresthesia, she does have neck pain, radiating pain to bilateral shoulder, had a history of right SI joint fusion, now presented with left hip pain  She has worsening urinary frequency, urgency, chronic constipation,  PHYSICAL EXAM:   Vitals:   08/03/21 0910  BP: 105/66  Pulse: 81  Weight: 145 lb (65.8 kg)  Height: 5\' 7"  (1.702 m)   Not recorded     Body mass index is 22.71 kg/m.  PHYSICAL EXAMNIATION:  Gen: NAD, conversant, well nourised, well groomed                     Cardiovascular: Regular rate rhythm, no peripheral edema, warm, nontender. Eyes: Conjunctivae clear without exudates or hemorrhage Neck: Supple, no carotid bruits. Pulmonary: Clear to auscultation bilaterally   NEUROLOGICAL EXAM:  MENTAL  STATUS: Speech:    Speech is normal; fluent and spontaneous with normal comprehension.  Cognition:     Orientation to time, place and person     Normal recent and remote memory     Normal Attention span and concentration     Normal Language, naming, repeating,spontaneous speech     Fund of knowledge   CRANIAL NERVES: CN II: Visual fields are full to confrontation. Pupils are round equal and briskly reactive to light. CN III, IV, VI: extraocular movement are normal. No ptosis. CN V: Facial sensation is intact to light touch CN VII: Face is symmetric with normal eye closure  CN VIII: Hearing is normal to causal conversation. CN IX, X: Phonation is normal. CN XI: Head turning and shoulder shrug are intact  MOTOR: There is no pronator drift of out-stretched arms. Muscle bulk and tone are normal. Muscle strength is normal.  REFLEXES: Reflexes are 2+ and symmetric at the biceps, triceps, 3/3 knees, and ankles. Plantar responses are extensor bilaterally  SENSORY: Intact to light touch, pinprick and vibratory sensation are intact in fingers and toes.  COORDINATION: There is no trunk or limb dysmetria noted.  GAIT/STANCE: Posture is normal. Gait is steady with normal steps, base, arm swing, and turning. Heel and toe walking are normal. Tandem gait is normal.  Romberg is absent.  REVIEW OF SYSTEMS:  Full 14 system review of systems performed and notable only for as above All other review of systems were negative.   ALLERGIES: Allergies  Allergen Reactions  Dilaudid [Hydromorphone Hcl] Other (See Comments)    Resp. arrest   Codeine Nausea And Vomiting and Rash   Morphine And Related Nausea And Vomiting    HOME MEDICATIONS: Current Outpatient Medications  Medication Sig Dispense Refill   Ascorbic Acid (VITAMIN C PO) Take 2 tablets by mouth daily.     Biotin 5 MG CAPS Take 5 mg by mouth daily.      Calcium Citrate-Vitamin D (CALCITRATE/VITAMIN D PO) Take 1 tablet by mouth  daily.     dicyclomine (BENTYL) 10 MG capsule Take 1 capsule by mouth 2 (two) times daily before a meal. 60 capsule 1   diltiazem (CARDIZEM CD) 120 MG 24 hr capsule TAKE 1 CAPSULE BY MOUTH ONCE A DAY 90 capsule 0   gabapentin (NEURONTIN) 100 MG capsule Take 2 capsules (200 mg total) by mouth 2 (two) times daily. 270 capsule 1   promethazine (PHENERGAN) 25 MG tablet Take 25 mg by mouth every 6 (six) hours as needed for nausea or vomiting.     DULoxetine (CYMBALTA) 60 MG capsule Take 2 capsules (120 mg total) by mouth daily. 180 capsule 0   No current facility-administered medications for this visit.    PAST MEDICAL HISTORY: Past Medical History:  Diagnosis Date   Anemia    hx   Anxiety    CAD (coronary artery disease)    a. 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 05/2013 Lexi CL: EF 77%, old small inferolat scar, no ischemia.   Common migraine with intractable migraine 06/16/2017   Depression    Fibromyalgia    GERD (gastroesophageal reflux disease)    hasn't started taking any meds   History of blood transfusion    58yrs ago   History of kidney stones    Hx of colonic polyps    IBS (irritable bowel syndrome)    constipation   Joint pain    Joint swelling    Migraine    "q other week" (02/04/2016)   Myocardial infarction (HCC)    Numbness    right leg   Polycythemia    PONV (postoperative nausea and vomiting)    Raynaud's disease    Sciatica of right side 04/21/2020   Scoliosis    Shortness of breath dyspnea    with exertion   SI (sacroiliac) joint dysfunction    right   Wears glasses     PAST SURGICAL HISTORY: Past Surgical History:  Procedure Laterality Date   ABDOMINAL HYSTERECTOMY     partial   APPENDECTOMY     with  explor. lap.   CARDIAC CATHETERIZATION     CHOLECYSTECTOMY  10/28/2002   lap.   COLONOSCOPY     DEBRIDEMENT TENNIS ELBOW     DIAGNOSTIC LAPAROSCOPY     DILATION AND CURETTAGE OF UTERUS     ELBOW SURGERY     golfer's elbow-bilateral   ESOPHAGOGASTRODUODENOSCOPY  09/10/2012   Procedure: ESOPHAGOGASTRODUODENOSCOPY (EGD);  Surgeon: Rogene Houston, MD;  [facility];  Service: Endoscopy;  Laterality: N/A;  730   EXCISION/RELEASE BURSA HIP Left 02/04/2016   Procedure: LEFT HIP TROCHANTERIC BURSECTOMY;  Surgeon: Mcarthur Rossetti, MD;  Location: [place];  Service: Orthopedics;  Laterality: Left;  I & D EXTREMITY  02/25/2012   Procedure: IRRIGATION AND DEBRIDEMENT EXTREMITY;  Surgeon: Roseanne Kaufman, MD;  Location: [place];  Service: Orthopedics;  Laterality: Right;  Irrigation and Debridement and Repair Right Thumb Nerve Laceration and Assoiated Structures    KNEE DEBRIDEMENT     right   LAPAROTOMY     LEFT HEART CATHETERIZATION WITH CORONARY ANGIOGRAM N/A 01/16/2013   Procedure: LEFT HEART CATHETERIZATION WITH CORONARY ANGIOGRAM;  Surgeon: Jolaine Artist, MD;  Location: [facility] CATH LAB;  Service: Cardiovascular;  Laterality: N/A;   SACROILIAC JOINT FUSION Right 05/02/2019   Procedure: SACROILIAC JOINT FUSION;  Surgeon: Melina Schools, MD;  Location: [place];  Service: Orthopedics;  Laterality: Right;  SACROILIAC JOINT FUSION   STAPEDECTOMY     right   TONSILLECTOMY AND ADENOIDECTOMY     TRIGGER FINGER RELEASE  08/19/2011   Procedure: RELEASE TRIGGER FINGER/A-1 PULLEY;  Surgeon: Willa Frater III;  Location: [place];  Service: Orthopedics;  Laterality: Right;  right middle finger a-1 pulley release with tenosynovectomy   TROCHANTERIC BURSA EXCISION Right 02/04/2016   TUBAL LIGATION     TYMPANOPLASTY      FAMILY HISTORY: Family History  Problem Relation Age of Onset   Cancer Mother        Deceased with lung CA   Heart failure Mother    Heart disease Mother     COPD Mother    Varicose Veins Mother    Depression Mother    Cancer Father        Deceased with lung CA   Heart disease Father    Varicose Veins Father    Depression Father    Depression Brother    Lung cancer Brother    ADD / ADHD Son    Parkinson's disease Sister    Other Sister        enlarged heart    Tremor Maternal Grandmother    Parkinson's disease Paternal Grandfather    Lung cancer Other        3 aunts, 2 uncles on both sides of family     SOCIAL HISTORY: Social History   Socioeconomic History   Marital status: Married    Spouse name: Not on file   Number of children: 2   Years of education: 14   Highest education level: Not on file  Occupational History   Not on file  Tobacco Use   Smoking status: Never   Smokeless tobacco: Never  Vaping Use   Vaping Use: Never used  Substance and Sexual Activity   Alcohol use: No    Alcohol/week: 0.0 standard drinks   Drug use: No   Sexual activity: Not Currently    Birth control/protection: Surgical  Other Topics Concern   Not on file  Social History Narrative   Lives in [place] with husband and her mother in law lives with them   Grown children.     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 Ph.D.  [facility] [address] [place], [place] [address] Ph: [phone]  Fax: [phone]  CC:  Celene Squibb, MD [address] [place],  [place] [address]  Celene Squibb, MD

## 2021-08-04 DIAGNOSIS — R251 Tremor, unspecified: Secondary | ICD-10-CM | POA: Diagnosis not present

## 2021-08-04 DIAGNOSIS — M797 Fibromyalgia: Secondary | ICD-10-CM | POA: Diagnosis not present

## 2021-08-04 DIAGNOSIS — R7301 Impaired fasting glucose: Secondary | ICD-10-CM | POA: Diagnosis not present

## 2021-08-04 DIAGNOSIS — F332 Major depressive disorder, recurrent severe without psychotic features: Secondary | ICD-10-CM | POA: Diagnosis not present

## 2021-08-04 DIAGNOSIS — Z0001 Encounter for general adult medical examination with abnormal findings: Secondary | ICD-10-CM | POA: Diagnosis not present

## 2021-08-04 DIAGNOSIS — M542 Cervicalgia: Secondary | ICD-10-CM | POA: Diagnosis not present

## 2021-08-04 DIAGNOSIS — R195 Other fecal abnormalities: Secondary | ICD-10-CM | POA: Diagnosis not present

## 2021-08-04 DIAGNOSIS — G43009 Migraine without aura, not intractable, without status migrainosus: Secondary | ICD-10-CM | POA: Diagnosis not present

## 2021-08-06 NOTE — Patient Instructions (Signed)
Stacey Lawrence  08/06/2021     @PREFPERIOPPHARMACY @   Your procedure is scheduled on Wednesday, 08/11/21.  Report to [facility] at [facility].M.  Call this number if you have problems the morning of surgery:  [phone]   Remember:  Do not eat or drink after midnight.     Take these medicines the morning of surgery with A SIP OF WATER diltiazem, cymbalta,a dn phenergan if needed    Do not wear jewelry, make-up or nail polish.  Do not wear lotions, powders, or perfumes, or deodorant.  Do not shave 48 hours prior to surgery.  Men may shave face and neck.  Do not bring valuables to the hospital.  [facility] is not responsible for any belongings or valuables.  Contacts, dentures or bridgework may not be worn into surgery.  Leave your suitcase in the car.  After surgery it may be brought to your room.  For patients admitted to the hospital, discharge time will be determined by your treatment team.  Patients discharged the day of surgery will not be allowed to drive home.   Name and phone number of your driver:   family Special instructions:  Follow special instructions given to you by Dr. Olevia Perches office   Please read over the following fact sheets that you were given. Anesthesia Post-op Instructions and Care and Recovery After Surgery      Colonoscopy, Adult, Care After This sheet gives you information about how to care for yourself after your procedure. Your health care provider may also give you more specific instructions. If you have problems or questions, contact your health care provider. What can I expect after the procedure? After the procedure, it is common to have: A small amount of blood in your stool for 24 hours after the procedure. Some gas. Mild cramping or bloating of your abdomen. Follow these instructions at home: Eating and drinking  Drink enough fluid to keep your urine pale yellow. Follow instructions from your health care provider about eating or  drinking restrictions. Resume your normal diet as instructed by your health care provider. Avoid heavy or fried foods that are hard to digest. Activity Rest as told by your health care provider. Avoid sitting for a long time without moving. Get up to take short walks every 1-2 hours. This is important to improve blood flow and breathing. Ask for help if you feel weak or unsteady. Return to your normal activities as told by your health care provider. Ask your health care provider what activities are safe for you. Managing cramping and bloating  Try walking around when you have cramps or feel bloated. Apply heat to your abdomen as told by your health care provider. Use the heat source that your health care provider recommends, such as a moist heat pack or a heating pad. Place a towel between your skin and the heat source. Leave the heat on for 20-30 minutes. Remove the heat if your skin turns bright red. This is especially important if you are unable to feel pain, heat, or cold. You may have a greater risk of getting burned. General instructions If you were given a sedative during the procedure, it can affect you for several hours. Do not drive or operate machinery until your health care provider says that it is safe. For the first 24 hours after the procedure: Do not sign important documents. Do not drink alcohol. Do your regular daily activities at a slower pace than normal. Eat soft foods  that are easy to digest. Take over-the-counter and prescription medicines only as told by your health care provider. Keep all follow-up visits as told by your health care provider. This is important. Contact a health care provider if: You have blood in your stool 2-3 days after the procedure. Get help right away if you have: More than a small spotting of blood in your stool. Large blood clots in your stool. Swelling of your abdomen. Nausea or vomiting. A fever. Increasing pain in your abdomen that is  not relieved with medicine. Summary After the procedure, it is common to have a small amount of blood in your stool. You may also have mild cramping and bloating of your abdomen. If you were given a sedative during the procedure, it can affect you for several hours. Do not drive or operate machinery until your health care provider says that it is safe. Get help right away if you have a lot of blood in your stool, nausea or vomiting, a fever, or increased pain in your abdomen. This information is not intended to replace advice given to you by your health care provider. Make sure you discuss any questions you have with your health care provider. Document Revised: 07/12/2019 Document Reviewed: 04/01/2019 Elsevier Patient Education  [place]. Colonoscopy, Adult A colonoscopy is a procedure to look at the entire large intestine. This procedure is done using a long, thin, flexible tube that has a camera on the end. You may have a colonoscopy: As a part of normal colorectal screening. If you have certain symptoms, such as: A low number of red blood cells in your blood (anemia). Diarrhea that does not go away. Pain in your abdomen. Blood in your stool. A colonoscopy can help screen for and diagnose medical problems, including: Tumors. Extra tissue that grows where mucus forms (polyps). Inflammation. Areas of bleeding. Tell your health care provider about: Any allergies you have. All medicines you are taking, including vitamins, herbs, eye drops, creams, and over-the-counter medicines. Any problems you or family members have had with anesthetic medicines. Any blood disorders you have. Any surgeries you have had. Any medical conditions you have. Any problems you have had with having bowel movements. Whether you are pregnant or may be pregnant. What are the risks? Generally, this is a safe procedure. However, problems may occur, including: Bleeding. Damage to your  intestine. Allergic reactions to medicines given during the procedure. Infection. This is rare. What happens before the procedure? Eating and drinking restrictions Follow instructions from your health care provider about eating or drinking restrictions, which may include: A few days before the procedure: Follow a low-fiber diet. Avoid nuts, seeds, dried fruit, raw fruits, and vegetables. 1-3 days before the procedure: Eat only gelatin dessert or ice pops. Drink only clear liquids, such as water, clear juice, clear broth or bouillon, black coffee or tea, or clear soft drinks or sports drinks. Avoid liquids that contain red or purple dye. The day of the procedure: Do not eat solid foods. You may continue to drink clear liquids until up to 2 hours before the procedure. Do not eat or drink anything starting 2 hours before the procedure, or within the time period that your health care provider recommends. Bowel prep If you were prescribed a bowel prep to take by mouth (orally) to clean out your colon: Take it as told by your health care provider. Starting the day before your procedure, you will need to drink a large amount of liquid medicine. The  liquid will cause you to have many bowel movements of loose stool until your stool becomes almost clear or light green. If your skin or the opening between the buttocks (anus) gets irritated from diarrhea, you may relieve the irritation using: Wipes with medicine in them, such as adult wet wipes with aloe and vitamin E. A product to soothe skin, such as petroleum jelly. If you vomit while drinking the bowel prep: Take a break for up to 60 minutes. Begin the bowel prep again. Call your health care provider if you keep vomiting or you cannot take the bowel prep without vomiting. To clean out your colon, you may also be given: Laxative medicines. These help you have a bowel movement. Instructions for enema use. An enema is liquid medicine injected into  your rectum. Medicines Ask your health care provider about: Changing or stopping your regular medicines or supplements. This is especially important if you are taking iron supplements, diabetes medicines, or blood thinners. Taking medicines such as aspirin and ibuprofen. These medicines can thin your blood. Do not take these medicines unless your health care provider tells you to take them. Taking over-the-counter medicines, vitamins, herbs, and supplements. General instructions Ask your health care provider what steps will be taken to help prevent infection. These may include washing skin with a germ-killing soap. Plan to have someone take you home from the hospital or clinic. What happens during the procedure?  An IV will be inserted into one of your veins. You may be given one or more of the following: A medicine to help you relax (sedative). A medicine to numb the area (local anesthetic). A medicine to make you fall asleep (general anesthetic). This is rarely needed. You will lie on your side with your knees bent. The tube will: Have oil or gel put on it (be lubricated). Be inserted into your anus. Be gently eased through all parts of your large intestine. Air will be sent into your colon to keep it open. This may cause some pressure or cramping. Images will be taken with the camera and will appear on a screen. A small tissue sample may be removed to be looked at under a microscope (biopsy). The tissue may be sent to a lab for testing if any signs of problems are found. If small polyps are found, they may be removed and checked for cancer cells. When the procedure is finished, the tube will be removed. The procedure may vary among health care providers and hospitals. What happens after the procedure? Your blood pressure, heart rate, breathing rate, and blood oxygen level will be monitored until you leave the hospital or clinic. You may have a small amount of blood in your stool. You  may pass gas and have mild cramping or bloating in your abdomen. This is caused by the air that was used to open your colon during the exam. Do not drive for 24 hours after the procedure. It is up to you to get the results of your procedure. Ask your health care provider, or the department that is doing the procedure, when your results will be ready. Summary A colonoscopy is a procedure to look at the entire large intestine. Follow instructions from your health care provider about eating and drinking before the procedure. If you were prescribed an oral bowel prep to clean out your colon, take it as told by your health care provider. During the colonoscopy, a flexible tube with a camera on its end is inserted into the anus  and then passed into the other parts of the large intestine. This information is not intended to replace advice given to you by your health care provider. Make sure you discuss any questions you have with your health care provider. Document Revised: 03/29/2019 Document Reviewed: 03/29/2019 Elsevier Patient Education  [place] After This sheet gives you information about how to care for yourself after your procedure. Your health care provider may also give you more specific instructions. If you have problems or questions, contact your health care provider. What can I expect after the procedure? After the procedure, it is common to have: Tiredness. Forgetfulness about what happened after the procedure. Impaired judgment for important decisions. Nausea or vomiting. Some difficulty with balance. Follow these instructions at home: For the time period you were told by your health care provider:   Rest as needed. Do not participate in activities where you could fall or become injured. Do not drive or use machinery. Do not drink alcohol. Do not take sleeping pills or medicines that cause drowsiness. Do not make important decisions or  sign legal documents. Do not take care of children on your own. Eating and drinking Follow the diet that is recommended by your health care provider. Drink enough fluid to keep your urine pale yellow. If you vomit: Drink water, juice, or soup when you can drink without vomiting. Make sure you have little or no nausea before eating solid foods. General instructions Have a responsible adult stay with you for the time you are told. It is important to have someone help care for you until you are awake and alert. Take over-the-counter and prescription medicines only as told by your health care provider. If you have sleep apnea, surgery and certain medicines can increase your risk for breathing problems. Follow instructions from your health care provider about wearing your sleep device: Anytime you are sleeping, including during daytime naps. While taking prescription pain medicines, sleeping medicines, or medicines that make you drowsy. Avoid smoking. Keep all follow-up visits as told by your health care provider. This is important. Contact a health care provider if: You keep feeling nauseous or you keep vomiting. You feel light-headed. You are still sleepy or having trouble with balance after 24 hours. You develop a rash. You have a fever. You have redness or swelling around the IV site. Get help right away if: You have trouble breathing. You have new-onset confusion at home. Summary For several hours after your procedure, you may feel tired. You may also be forgetful and have poor judgment. Have a responsible adult stay with you for the time you are told. It is important to have someone help care for you until you are awake and alert. Rest as told. Do not drive or operate machinery. Do not drink alcohol or take sleeping pills. Get help right away if you have trouble breathing, or if you suddenly become confused. This information is not intended to replace advice given to you by your  health care provider. Make sure you discuss any questions you have with your health care provider. Document Revised: 05/21/2020 Document Reviewed: 08/08/2019 Elsevier Patient Education  2022 [employer].

## 2021-08-07 ENCOUNTER — Ambulatory Visit
Admission: RE | Admit: 2021-08-07 | Discharge: 2021-08-07 | Disposition: A | Discharge status: Home / Self Care | Payer: Medicare HMO | Source: Ambulatory Visit | Attending: Neurology | Admitting: Neurology

## 2021-08-07 ENCOUNTER — Other Ambulatory Visit: Payer: Self-pay

## 2021-08-07 DIAGNOSIS — M4802 Spinal stenosis, cervical region: Secondary | ICD-10-CM | POA: Diagnosis not present

## 2021-08-07 DIAGNOSIS — M542 Cervicalgia: Secondary | ICD-10-CM | POA: Diagnosis not present

## 2021-08-07 DIAGNOSIS — R270 Ataxia, unspecified: Secondary | ICD-10-CM | POA: Diagnosis not present

## 2021-08-07 DIAGNOSIS — R269 Unspecified abnormalities of gait and mobility: Secondary | ICD-10-CM

## 2021-08-09 ENCOUNTER — Encounter (HOSPITAL_COMMUNITY)
Admission: RE | Admit: 2021-08-09 | Discharge: 2021-08-09 | Disposition: A | Discharge status: Home / Self Care | Payer: Medicare HMO | Source: Ambulatory Visit | Attending: Internal Medicine | Admitting: Internal Medicine

## 2021-08-09 ENCOUNTER — Telehealth: Payer: Self-pay | Admitting: Neurology

## 2021-08-09 ENCOUNTER — Encounter (HOSPITAL_COMMUNITY): Payer: Self-pay

## 2021-08-09 VITALS — BP 124/55 | HR 77 | Temp 97.8°F | Resp 18 | Ht 67.0 in | Wt 143.0 lb

## 2021-08-09 DIAGNOSIS — Z01810 Encounter for preprocedural cardiovascular examination: Secondary | ICD-10-CM | POA: Insufficient documentation

## 2021-08-09 DIAGNOSIS — D649 Anemia, unspecified: Secondary | ICD-10-CM | POA: Diagnosis not present

## 2021-08-09 NOTE — Telephone Encounter (Signed)
1. Mild chronic retrolisthesis at C5-C6 with associated disc, endplate, and ligamentous degeneration. Mild spinal stenosis. Mild if any cord mass effect, and no cord signal abnormality. But moderate to severe C6 neural foraminal stenosis greater on the left.   2. Chronic disc and endplate degeneration at C6-C7 with moderate to severe left C7 foraminal stenosis.   3. Mild to moderate left neural foraminal stenosis at C7-T1 related to endplate and facet spurring.  Please call patient, MRI of the cervical spine showed mild degenerative changes, there is no evidence of spinal cord or nerve root compression.

## 2021-08-09 NOTE — Telephone Encounter (Signed)
I spoke to the patient. She verbalized understanding of the MRI results. She will move forward with physical therapy.

## 2021-08-11 ENCOUNTER — Encounter (HOSPITAL_COMMUNITY)
Admission: RE | Disposition: A | Discharge status: Home / Self Care | Payer: Self-pay | Source: Ambulatory Visit | Attending: Internal Medicine

## 2021-08-11 ENCOUNTER — Ambulatory Visit (HOSPITAL_COMMUNITY): Payer: Medicare HMO | Admitting: Anesthesiology

## 2021-08-11 ENCOUNTER — Ambulatory Visit (HOSPITAL_COMMUNITY)
Admission: RE | Admit: 2021-08-11 | Discharge: 2021-08-11 | Disposition: A | Discharge status: Home / Self Care | Payer: Medicare HMO | Source: Ambulatory Visit | Attending: Internal Medicine | Admitting: Internal Medicine

## 2021-08-11 ENCOUNTER — Encounter (HOSPITAL_COMMUNITY): Payer: Self-pay | Admitting: Internal Medicine

## 2021-08-11 ENCOUNTER — Other Ambulatory Visit: Payer: Self-pay

## 2021-08-11 DIAGNOSIS — Z1211 Encounter for screening for malignant neoplasm of colon: Secondary | ICD-10-CM | POA: Diagnosis not present

## 2021-08-11 DIAGNOSIS — K581 Irritable bowel syndrome with constipation: Secondary | ICD-10-CM | POA: Insufficient documentation

## 2021-08-11 DIAGNOSIS — K5730 Diverticulosis of large intestine without perforation or abscess without bleeding: Secondary | ICD-10-CM | POA: Diagnosis not present

## 2021-08-11 DIAGNOSIS — D123 Benign neoplasm of transverse colon: Secondary | ICD-10-CM | POA: Diagnosis not present

## 2021-08-11 DIAGNOSIS — K635 Polyp of colon: Secondary | ICD-10-CM | POA: Diagnosis not present

## 2021-08-11 DIAGNOSIS — D649 Anemia, unspecified: Secondary | ICD-10-CM | POA: Insufficient documentation

## 2021-08-11 DIAGNOSIS — K644 Residual hemorrhoidal skin tags: Secondary | ICD-10-CM | POA: Insufficient documentation

## 2021-08-11 DIAGNOSIS — K219 Gastro-esophageal reflux disease without esophagitis: Secondary | ICD-10-CM | POA: Diagnosis not present

## 2021-08-11 DIAGNOSIS — I25119 Atherosclerotic heart disease of native coronary artery with unspecified angina pectoris: Secondary | ICD-10-CM | POA: Diagnosis not present

## 2021-08-11 HISTORY — PX: COLONOSCOPY WITH PROPOFOL: SHX5780

## 2021-08-11 HISTORY — PX: POLYPECTOMY: SHX5525

## 2021-08-11 LAB — HM COLONOSCOPY

## 2021-08-11 SURGERY — COLONOSCOPY WITH PROPOFOL
Anesthesia: General

## 2021-08-11 MED ORDER — PROPOFOL 10 MG/ML IV BOLUS
INTRAVENOUS | Status: DC | PRN
  Administered 2021-08-11 (×2): 30 mg via INTRAVENOUS
  Administered 2021-08-11: 80 mg via INTRAVENOUS

## 2021-08-11 MED ORDER — PHENYLEPHRINE HCL (PRESSORS) 10 MG/ML IV SOLN
INTRAVENOUS | Status: DC | PRN
  Administered 2021-08-11 (×4): 80 ug via INTRAVENOUS

## 2021-08-11 MED ORDER — LIDOCAINE HCL (CARDIAC) PF 100 MG/5ML IV SOSY
PREFILLED_SYRINGE | INTRAVENOUS | Status: DC | PRN
  Administered 2021-08-11: 40 mg via INTRATRACHEAL

## 2021-08-11 MED ORDER — LACTATED RINGERS IV SOLN: INTRAVENOUS | Status: DC

## 2021-08-11 MED ORDER — LACTATED RINGERS IV SOLN: INTRAVENOUS | Status: DC | PRN

## 2021-08-11 MED ORDER — PROPOFOL 500 MG/50ML IV EMUL
INTRAVENOUS | Status: DC | PRN
  Administered 2021-08-11: 125 ug/kg/min via INTRAVENOUS

## 2021-08-11 NOTE — Discharge Instructions (Signed)
No aspirin or NSAIDs 24 hours. Resume usual medications as before. High-fiber diet. No driving for 24 hours. Physician will call with biopsy results.

## 2021-08-11 NOTE — Anesthesia Preprocedure Evaluation (Addendum)
Anesthesia Evaluation  Patient identified by MRN, date of birth, ID band Patient awake    Reviewed: Allergy & Precautions, H&P , NPO status , Patient's Chart, lab work & pertinent test results  History of Anesthesia Complications (+) PONV and history of anesthetic complications  Airway Mallampati: II  TM Distance: >3 FB Neck ROM: Full    Dental  (+) Dental Advisory Given [place] :   Pulmonary shortness of breath and with exertion,    Pulmonary exam normal breath sounds clear to auscultation       Cardiovascular Exercise Tolerance: Good + angina + CAD and + Past MI  Normal cardiovascular exam Rhythm:Regular Rate:Normal     Neuro/Psych  Headaches, PSYCHIATRIC DISORDERS Anxiety Depression  Neuromuscular disease    GI/Hepatic Neg liver ROS, GERD  Medicated and Controlled,  Endo/Other  negative endocrine ROS  Renal/GU negative Renal ROS  negative genitourinary   Musculoskeletal  (+) Fibromyalgia -  Abdominal   Peds negative pediatric ROS (+)  Hematology negative hematology ROS (+) anemia ,   Anesthesia Other Findings 1. Mild chronic retrolisthesis at C5-C6 with associated disc,endplate, and ligamentous degeneration. Mild spinal stenosis. Mild if any cord mass effect, and no cordsignal abnormality. But moderate to severe C6 neural foraminal stenosis greater on the left.  2. Chronic disc and endplate degeneration at C6-C7 with moderate to severe left C7 foraminal stenosis.  3. Mild to moderate left neural foraminal stenosis at C7-T1 related to endplate and facet spurring.  Reproductive/Obstetrics negative OB ROS                           Anesthesia Physical Anesthesia Plan  ASA: 3  Anesthesia Plan: General   Post-op Pain Management: Minimal or no pain anticipated   Induction:   PONV Risk Score and Plan: TIVA  Airway Management Planned: Nasal Cannula and Natural Airway  Additional  Equipment:   Intra-op Plan:   Post-operative Plan:   Informed Consent: I have reviewed the patients History and Physical, chart, labs and discussed the procedure including the risks, benefits and alternatives for the proposed anesthesia with the patient or authorized representative who has indicated his/her understanding and acceptance.     Dental advisory given  Plan Discussed with: CRNA and Surgeon  Anesthesia Plan Comments:         Anesthesia Quick Evaluation

## 2021-08-11 NOTE — Transfer of Care (Signed)
Immediate Anesthesia Transfer of Care Note  Patient: Stacey Lawrence  Procedure(s) Performed: COLONOSCOPY WITH PROPOFOL POLYPECTOMY  Patient Location: Short Stay  Anesthesia Type:General  Level of Consciousness: drowsy  Airway & Oxygen Therapy: Patient Spontanous Breathing  Post-op Assessment: Report given to RN and Post -op Vital signs reviewed and stable  Post vital signs: Reviewed and stable  Last Vitals:  Vitals Value Taken Time  BP    Temp    Pulse    Resp    SpO2      Last Pain:  Vitals:   08/11/21 0731  TempSrc:   PainSc: 0-No pain         Complications: No notable events documented.

## 2021-08-11 NOTE — Interval H&P Note (Signed)
History and Physical Interval Note:  08/11/2021 7:27 AM  Stacey Lawrence  has presented today for surgery, with the diagnosis of Screening Colonoscopy.  The various methods of treatment have been discussed with the patient and family. After consideration of risks, benefits and other options for treatment, the patient has consented to  Procedure(s) with comments: COLONOSCOPY WITH PROPOFOL (N/A) - 8:30 as a surgical intervention.  The patient's history has been reviewed, patient examined, no change in status, stable for surgery.  I have reviewed the patient's chart and labs.  Questions were answered to the patient's satisfaction.     [employer]

## 2021-08-11 NOTE — Op Note (Signed)
[facility] Patient Name: Stacey Lawrence Procedure Date: 08/11/2021 7:25 AM MRN: [id number] Date of Birth: October 07, 1950 Attending MD: Hildred Laser , MD CSN: [id number] Age: 70 Admit Type: Outpatient Procedure:                Colonoscopy Indications:              Screening for colorectal malignant neoplasm Providers:                Hildred Laser, MD, Charlsie Quest. Insurance claims handler, Therapist, sports,                            Suzan Garibaldi. Risa Grill, Technician Referring MD:             Delphina Cahill, MD Medicines:                Propofol per Anesthesia Complications:            No immediate complications. Estimated Blood Loss:     Estimated blood loss was minimal. Procedure:                Pre-Anesthesia Assessment:                           - Prior to the procedure, a History and Physical                            was performed, and patient medications and                            allergies were reviewed. The patient's tolerance of                            previous anesthesia was also reviewed. The risks                            and benefits of the procedure and the sedation                            options and risks were discussed with the patient.                            All questions were answered, and informed consent                            was obtained. Prior Anticoagulants: The patient has                            taken no previous anticoagulant or antiplatelet                            agents. ASA Grade Assessment: III - A patient with                            severe systemic disease. After reviewing the risks  and benefits, the patient was deemed in                            satisfactory condition to undergo the procedure.                           After obtaining informed consent, the colonoscope                            was passed under direct vision. Throughout the                            procedure, the patient's blood pressure, pulse, and                             oxygen saturations were monitored continuously. The                            [id number]-HQ190L ([id number]) scope was introduced through                            the anus and advanced to the the cecum, identified                            by appendiceal orifice and ileocecal valve. The                            colonoscopy was performed without difficulty. The                            patient tolerated the procedure well. The quality                            of the bowel preparation was excellent. The                            ileocecal valve, appendiceal orifice, and rectum                            were photographed. Scope In: 3:50:09 AM Scope Out: 7:59:26 AM Scope Withdrawal Time: 0 hours 17 minutes 14 seconds  Total Procedure Duration: 0 hours 23 minutes 15 seconds  Findings:      The perianal and digital rectal examinations were normal.      Scattered diverticula were found in the sigmoid colon and transverse       colon.      Two sessile polyps were found in the splenic flexure and transverse       colon. The polyps were small in size. These polyps were removed with a       cold snare. Resection and retrieval were complete. The pathology       specimen was placed into Bottle Number 1.      A 4 to 8 mm polyp was found in the splenic flexure. The polyp was flat.       The polyp was removed with a  cold snare. Resection and retrieval were       complete. The pathology specimen was placed into Bottle Number 2.      External hemorrhoids were found during retroflexion. The hemorrhoids       were small. Impression:               - Diverticulosis in the sigmoid colon and in the                            transverse colon.                           - Two small polyps at the splenic flexure and in                            the transverse colon, removed with a cold snare.                            Resected and retrieved.                           - One 4 to 8 mm  polyp at the splenic flexure,                            removed with a cold snare. Resected and retrieved.                           - External hemorrhoids. Moderate Sedation:      Per Anesthesia Care Recommendation:           - Patient has a contact number available for                            emergencies. The signs and symptoms of potential                            delayed complications were discussed with the                            patient. Return to normal activities tomorrow.                            Written discharge instructions were provided to the                            patient.                           - High fiber diet today.                           - Continue present medications.                           - No aspirin, ibuprofen, naproxen, or other  non-steroidal anti-inflammatory drugs for 1 day.                           - See the other procedure note for documentation of                            additional recommendations.                           - Await pathology results.                           - Repeat colonoscopy for surveillance based on                            pathology results. Procedure Code(s):        --- Professional ---                           [phone], Colonoscopy, flexible; with removal of                            tumor(s), polyp(s), or other lesion(s) by snare                            technique Diagnosis Code(s):        --- Professional ---                           K63.5, Polyp of colon                           Z12.11, Encounter for screening for malignant                            neoplasm of colon                           K64.4, Residual hemorrhoidal skin tags                           K57.30, Diverticulosis of large intestine without                            perforation or abscess without bleeding CPT copyright 2019 American Medical Association. All rights reserved. The codes documented in  this report are preliminary and upon coder review may  be revised to meet current compliance requirements. Hildred Laser, MD Hildred Laser, MD 08/11/2021 8:10:12 AM This report has been signed electronically. Number of Addenda: 0

## 2021-08-11 NOTE — Anesthesia Postprocedure Evaluation (Signed)
Anesthesia Post Note  Patient: Stacey Lawrence  Procedure(s) Performed: COLONOSCOPY WITH PROPOFOL POLYPECTOMY  Patient location during evaluation: Phase II Anesthesia Type: General Level of consciousness: awake and alert and oriented Pain management: pain level controlled Vital Signs Assessment: post-procedure vital signs reviewed and stable Respiratory status: spontaneous breathing, nonlabored ventilation and respiratory function stable Cardiovascular status: blood pressure returned to baseline and stable Postop Assessment: no apparent nausea or vomiting Anesthetic complications: no   No notable events documented.   Last Vitals:  Vitals:   08/11/21 0805 08/11/21 0822  BP: (!) 88/44 (!) 110/55  Pulse: 69   Resp: 19   Temp: 36.5 C   SpO2: 96%     Last Pain:  Vitals:   08/11/21 0822  TempSrc:   PainSc: 0-No pain                 Macario Shear C Maleek Craver

## 2021-08-13 LAB — SURGICAL PATHOLOGY

## 2021-08-16 ENCOUNTER — Other Ambulatory Visit: Payer: Self-pay

## 2021-08-16 ENCOUNTER — Ambulatory Visit: Payer: Medicare HMO | Attending: Neurology | Admitting: Physical Therapy

## 2021-08-16 ENCOUNTER — Encounter: Payer: Self-pay | Admitting: Physical Therapy

## 2021-08-16 DIAGNOSIS — R2689 Other abnormalities of gait and mobility: Secondary | ICD-10-CM | POA: Diagnosis not present

## 2021-08-16 DIAGNOSIS — M6281 Muscle weakness (generalized): Secondary | ICD-10-CM

## 2021-08-16 DIAGNOSIS — R2681 Unsteadiness on feet: Secondary | ICD-10-CM | POA: Diagnosis not present

## 2021-08-16 DIAGNOSIS — R269 Unspecified abnormalities of gait and mobility: Secondary | ICD-10-CM | POA: Insufficient documentation

## 2021-08-16 NOTE — Therapy (Signed)
[facility] [place], [place], [name], [address] Phone: [phone]   Fax:  [phone]  Physical Therapy Evaluation  Patient Details  Name: Stacey Lawrence MRN: [id number] Date of Birth: 11/28/1950 Referring Provider (PT): Krista Blue   Encounter Date: 08/16/2021   PT End of Session - 08/16/21 0916     Visit Number 1    Number of Visits 12    Date for PT Re-Evaluation 09/24/21    Authorization Type [employer]    Progress Note Due on Visit 10    PT Start Time 0920    PT Stop Time 1020    PT Time Calculation (min) 60 min    Activity Tolerance Patient tolerated treatment well    Behavior During Therapy [facility] for tasks assessed/performed             Past Medical History:  Diagnosis Date   Anemia    hx   Anxiety    CAD (coronary artery disease)    a. 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 05/2013 [facility]: EF 77%, old small inferolat scar, no ischemia.   Common migraine with intractable migraine 06/16/2017   Depression    Fibromyalgia    GERD (gastroesophageal reflux disease)    hasn't started taking any meds   History of blood transfusion    2yrs ago   History of kidney stones    Hx of colonic polyps    IBS (irritable bowel syndrome)    constipation   Joint pain    Joint swelling    Migraine    "q other week" (02/04/2016)   Myocardial infarction ([place]) 2014   Numbness    right leg   Polycythemia    PONV (postoperative nausea and vomiting)    Raynaud's disease    Sciatica of right side 04/21/2020   Scoliosis    Shortness of breath dyspnea    with exertion   SI (sacroiliac) joint dysfunction    right   Wears glasses     Past Surgical History:  Procedure  Laterality Date   ABDOMINAL HYSTERECTOMY     partial   APPENDECTOMY     with explor. lap.   CARDIAC CATHETERIZATION     CHOLECYSTECTOMY  10/28/2002   lap.   COLONOSCOPY     DEBRIDEMENT TENNIS ELBOW     DIAGNOSTIC LAPAROSCOPY     DILATION AND CURETTAGE OF UTERUS     ELBOW SURGERY     golfer's elbow-bilateral   ESOPHAGOGASTRODUODENOSCOPY  09/10/2012   Procedure: ESOPHAGOGASTRODUODENOSCOPY (EGD);  Surgeon: Rogene Houston, MD;  [facility];  Service: Endoscopy;  Laterality: N/A;  730   EXCISION/RELEASE BURSA HIP Left 02/04/2016   Procedure: LEFT HIP TROCHANTERIC BURSECTOMY;  Surgeon: Mcarthur Rossetti, MD;  Location: [place];  Service: Orthopedics;  Laterality: Left;   I & D EXTREMITY  02/25/2012   Procedure: IRRIGATION AND DEBRIDEMENT EXTREMITY;  Surgeon: Roseanne Kaufman, MD;  Location: [place];  Service: Orthopedics;  Laterality: Right;  Irrigation and Debridement and Repair Right Thumb Nerve Laceration and Assoiated Structures    KNEE DEBRIDEMENT     right   LAPAROTOMY     LEFT HEART  CATHETERIZATION WITH CORONARY ANGIOGRAM N/A 01/16/2013   Procedure: LEFT HEART CATHETERIZATION WITH CORONARY ANGIOGRAM;  Surgeon: Jolaine Artist, MD;  Location: [facility] C[facility];  Service: Cardiovascular;  Laterality: N/A;   SACROILIAC JOINT FUSION Right 05/02/2019   Procedure: SACROILIAC JOINT FUSION;  Surgeon: Melina Schools, MD;  Location: [place];  Service: Orthopedics;  Laterality: Right;  SACROILIAC JOINT FUSION   STAPEDECTOMY     right   TONSILLECTOMY AND ADENOIDECTOMY     TRIGGER FINGER RELEASE  08/19/2011   Procedure: RELEASE TRIGGER FINGER/A-1 PULLEY;  Surgeon: Willa Frater III;  Location: [place];  Service: Orthopedics;  Laterality: Right;  right middle finger a-1 pulley release with tenosynovectomy   TROCHANTERIC BURSA EXCISION Right 02/04/2016   TUBAL LIGATION     TYMPANOPLASTY      There were no vitals filed for this visit.    Subjective Assessment - 08/16/21  0917     Subjective Feel like I am very weak, especially in my hands.  Also with my hands/leg, I will have jerking motions and have tremors in my hands.  I can turn and be "off" left of right.  Sometimes have walked into the doorframe.  Turning can make me totally off-balance; difficulty looking up and walking.  This has gone on for about 1 year.  Have fallen-either in the yard or while carrying heavy objects.  Have had 3 falls in the past 6 months.  Does not use asssistive device.    Pertinent History PMH:  CAD, depression, anxiety, chronic migraine, fibromyalgia, IBS, MI, SI joint dysfunction, scoliosis, sciatica R side, L hip trochanteric bursectomy, SI joint fusion    Patient Stated Goals Pt's goals for therapy are to walk and not be dizzy; to walk and not be dizzy.    Currently in Pain? Yes    Pain Score 6    chronic, baseline pain   Pain Location Hip    Pain Orientation Right;Left    Pain Descriptors / Indicators Aching    Pain Type Chronic pain    Pain Onset More than a month ago    Pain Frequency Constant    Aggravating Factors  "just live with it"    Pain Relieving Factors anti-inflammatory medications; chiropractor visits occasionally    Effect of Pain on Daily Activities PT will monitor, but will not address as a goal due to chronic nature of pain                [facility] PT Assessment - 08/16/21 0929       Assessment   Medical Diagnosis gait abnormality    Referring Provider (PT) Krista Blue    Onset Date/Surgical Date 08/03/21    Hand Dominance Right      Precautions   Precautions Fall      Balance Screen   Has the patient fallen in the past 6 months Yes    How many times? 3    Has the patient had a decrease in activity level because of a fear of falling?  Yes    Is the patient reluctant to leave their home because of a fear of falling?  No      Home Social worker Private residence    Living Arrangements Spouse/significant other    Available Help at  Discharge Family    Type of [place] Access Level entry    [place] One level    [place] - 2  wheels      Prior Function   Level of Independence Independent    Vocation Retired    Leisure Enjoys taking long walks (has not done in a while)      Observation/Other Assessments   Focus on Therapeutic Outcomes (FOTO)  NA      Sensation   Light Touch Appears Intact      Tone   Assessment Location Right Lower Extremity;Left Lower Extremity      ROM / Strength   AROM / PROM / Strength AROM;Strength      AROM   Overall AROM  Within functional limits for tasks performed      Strength   Overall Strength Deficits    Strength Assessment Site Hip;Knee;Ankle    Right/Left Hip Right;Left    Right Hip Flexion 3+/5    Left Hip Flexion 3+/5    Right/Left Knee Right;Left    Right Knee Flexion 4/5    Right Knee Extension 3+/5    Left Knee Flexion 4/5    Left Knee Extension 3+/5    Right/Left Ankle Right;Left    Right Ankle Dorsiflexion 3+/5    Left Ankle Dorsiflexion 3+/5      Transfers   Transfers Sit to Stand;Stand to Sit    Sit to Stand 6: Modified independent (Device/Increase time);Without upper extremity assist;From chair/3-in-1    Five time sit to stand comments  15.97    Stand to Sit 6: Modified independent (Device/Increase time);Without upper extremity assist;To chair/3-in-1      Ambulation/Gait   Ambulation/Gait Yes    Ambulation/Gait Assistance 5: Supervision    Ambulation Distance (Feet) 60 Feet    Assistive device None    Gait Pattern Step-through pattern;Decreased arm swing - right;Decreased arm swing - left;Poor foot clearance - right;Wide base of support    Gait velocity 11.06 sec = 2.99 ft/sec      Standardized Balance Assessment   Standardized Balance Assessment Timed Up and Go Test      Timed Up and Go Test   Normal TUG (seconds) 13.53    Manual TUG (seconds) 15   increased tremor RUE   Cognitive TUG (seconds) 15.28   stops counting    TUG Comments Scores >13.5-15 sec indicates increased fall risk      High Level Balance   High Level Balance Comments Standing EO and EC on solid surface x 30 seconds, then on compliant surface, 30 seconds (increased sway on EC on compliant surface, increased posterior sway      Functional Gait  Assessment   Gait assessed  Yes    Gait Level Surface Walks 20 ft, slow speed, abnormal gait pattern, evidence for imbalance or deviates 10-15 in outside of the 12 in walkway width. Requires more than 7 sec to ambulate 20 ft.   8.75   Change in Gait Speed Able to change speed, demonstrates mild gait deviations, deviates 6-10 in outside of the 12 in walkway width, or no gait deviations, unable to achieve a major change in velocity, or uses a change in velocity, or uses an assistive device.    Gait with Horizontal Head Turns Performs head turns smoothly with slight change in gait velocity (eg, minor disruption to smooth gait path), deviates 6-10 in outside 12 in walkway width, or uses an assistive device.   8.6   Gait with Vertical Head Turns Performs task with severe disruption of gait (eg, staggers 15 in outside 12 in walkway width, loses balance, stops, reaches for wall).  Gait and Pivot Turn Pivot turns safely in greater than 3 sec and stops with no loss of balance, or pivot turns safely within 3 sec and stops with mild imbalance, requires small steps to catch balance.    Step Over Obstacle Is able to step over one shoe box (4.5 in total height) but must slow down and adjust steps to clear box safely. May require verbal cueing.    Gait with Narrow Base of Support Ambulates 4-7 steps.    Gait with Eyes Closed Walks 20 ft, slow speed, abnormal gait pattern, evidence for imbalance, deviates 10-15 in outside 12 in walkway width. Requires more than 9 sec to ambulate 20 ft.   11.6   Ambulating Backwards Walks 20 ft, uses assistive device, slower speed, mild gait deviations, deviates 6-10 in outside 12 in  walkway width.   16.59   Steps Alternating feet, must use rail.    Total Score 14    FGA comment: Scores <22/30 indicate increased fall risk.      RLE Tone   RLE Tone Within Functional Limits      LLE Tone   LLE Tone Mild                    Vestibular Assessment - 08/16/21 0001       Symptom Behavior   Subjective history of current problem With quick body turns, looking up/down and standing on unlevel surface with EC in eval today, pt rates symptoms as 8/10, subsides in a few seconds    Type of Dizziness  Unsteady with head/body turns   off balance   Aggravating Factors Looking up to the ceiling;Turning body quickly;Turning head sideways    Relieving Factors Slow movements      Oculomotor Exam   Ocular ROM saccadic eye motion horizontal; pt reports nausea with "all motions"    Smooth Pursuits Saccades   horizontal   Saccades Overshoots   Appears to overshoot to L; Rates 6/10 dizziness with saccades to L; 4/10 diziness with saccades to R; ;vertical, pt also c/o dizziness, not as much     Vestibulo-Ocular Reflex   VOR 1 Head Only (x 1 viewing) performs x 5 reps; rates as 7/10 on dizziness (horizontal)    VOR Cancellation --   appears normal; rates dizziness at 7/10               Objective measurements completed on examination: See above findings.                PT Education - 08/16/21 1444     Education Details PT eval results, POC, initiated HEP (sent via email on [facility])    Person(s) Educated Patient    Methods Explanation;Demonstration;Other (comment)   email   Comprehension Verbalized understanding              PT Short Term Goals - 08/16/21 1432       PT SHORT TERM GOAL #1   Title Pt will be independent with HEP for improved strength, balance, transfers, and gait.  TARGET 09/10/2021    Time 4    Period Weeks    Status New      PT SHORT TERM GOAL #2   Title Pt will improve 5x sit<>stand to less than or equal to 13 sec to  demonstrate improved functional strength and transfer efficiency.    Baseline 15.97 sec at eval    Time 4    Period Weeks    Status  New      PT SHORT TERM GOAL #3   Title Pt will improve FGA score to at least 18/30 to decrease fall risk.    Baseline 14/30    Time 4    Period Weeks    Status New      PT SHORT TERM GOAL #4   Title Pt will verbalize understanding of fall prevention in home environment.    Time 4    Period Weeks    Status New               PT Long Term Goals - 08/16/21 1436       PT LONG TERM GOAL #1   Title Pt will be independent with progression of HEP for improved strength, balance, transfers, and gait.  TARGET 09/24/2021    Time 6    Period Weeks    Status New      PT LONG TERM GOAL #2   Title Pt will improve 5x sit<>stand to less than or equal to 11.5 secto demonstrate improved functional strength and transfer efficiency.    Time 6    Period Weeks    Status New      PT LONG TERM GOAL #3   Title Pt will improve FGA score to at least 22/30 to decrease fall risk.    Time 6    Period Weeks    Status New      PT LONG TERM GOAL #4   Title Pt will report 3-4/10 or less dizziness/unsteadiness with head motions to improve overall balance.    Baseline 6-8/10 reports during eval    Time 6    Period Weeks    Status New      PT LONG TERM GOAL #5   Title Pt will perform condition 4 on MCTSIB (eyes closed, standing on foam) for 30 seconds with minimal to no sway to demo improved vestibular system use for balance.    Baseline 30 sec with increased posterior sway    Time 6    Period Weeks    Status New                    Plan - 08/16/21 0916     Clinical Impression Statement Pt is a 70 year old female who presents to OPPT with dx of gait abnormality.  She presents with decreased lower extremity strength, bilat UE tremor (worsens on R with manual carrying tasks), decreased balance, abnormality of gait.  She has history of at least 3 falls in  the past 6 months; she is at fall risk per FGA, 5TSTS score.  She has difficulty maintaining balance on compliant surface with eyes closed with increased trunk sway noted; she reports increased dizziness/unsteadiness with vestibular ocular testing initiated at eval.  She would benefit from skilled PT to address the above stated deficits to decrease fall risk and improve overall functional mobility.    Personal Factors and Comorbidities Comorbidity 3+    Comorbidities PMH:  CAD, depression, anxiety, chronic migraine, fibromyalgia, IBS, MI, SI joint dysfunction, scoliosis, sciatica R side, L hip trochanteric bursectomy, SI joint fusion    Examination-Activity Limitations Locomotion Level;Transfers;Stand;Caring for Others    Examination-Participation Restrictions Community Activity;Other;Occupation   [place]; potential return to work   Stability/Clinical Decision Making Evolving/Moderate complexity    Clinical Decision Making Moderate    Rehab Potential Good    PT Frequency 2x / week    PT Duration 6 weeks  including eval week   PT Treatment/Interventions ADLs/Self Care Home Management;Gait training;Stair training;Functional mobility training;Therapeutic activities;Therapeutic exercise;Balance training;Neuromuscular re-education;Patient/family education;Vestibular    PT Next Visit Plan Review x1 viewing for HEP and add to HEP as appropriate.  HEP to address functional lower extremity and trunk strengthening, vestibular/ocular exercises to help with balance.             Patient will benefit from skilled therapeutic intervention in order to improve the following deficits and impairments:  Abnormal gait, Difficulty walking, Decreased balance, Decreased mobility, Decreased strength  Visit Diagnosis: Unsteadiness on feet  Other abnormalities of gait and mobility  Muscle weakness (generalized)     Problem List Patient Active Problem List   Diagnosis Date Noted   Gait abnormality  08/03/2021   Irritable bowel syndrome (IBS) 07/13/2021   Sciatica of right side 04/21/2020   Microvascular angina ([place]) 01/01/2019   Unstable angina ([facility])    Common migraine with intractable migraine 06/16/2017   Tremor 11/16/2016   Trochanteric bursitis of left hip 02/04/2016   Trochanteric bursitis of both hips 10/31/2015   Chest pain at rest 06/17/2015   Inferior myocardial infarction [facility]) 06/17/2015   Sprain of interphalangeal joint of left little finger 02/05/2014   Pain in joint, hand 02/05/2014   Midsternal chest pain 06/04/2013   CAD (coronary artery disease)    Fibromyalgia    GERD (gastroesophageal reflux disease)    Migraine headache    Acute non-STEMI < 8 weeks prior, subsequent admission after initial 01/16/2013   Radial nerve laceration 03/27/2012   Lack of coordination 03/27/2012   Muscle weakness (generalized) 03/27/2012    Kashlyn Salinas W., PT 08/16/2021, 2:47 PM  [facility] [address] [place], [name], [address] Phone: [phone]   Fax:  [phone]  Name: LISANNE PONCE MRN: [id number] Date of Birth: 1950/10/25

## 2021-08-18 ENCOUNTER — Other Ambulatory Visit: Payer: Self-pay

## 2021-08-18 ENCOUNTER — Encounter: Payer: Self-pay | Admitting: Physical Therapy

## 2021-08-18 ENCOUNTER — Ambulatory Visit: Payer: Medicare HMO | Admitting: Physical Therapy

## 2021-08-18 DIAGNOSIS — R269 Unspecified abnormalities of gait and mobility: Secondary | ICD-10-CM | POA: Diagnosis not present

## 2021-08-18 DIAGNOSIS — M6281 Muscle weakness (generalized): Secondary | ICD-10-CM | POA: Diagnosis not present

## 2021-08-18 DIAGNOSIS — R2689 Other abnormalities of gait and mobility: Secondary | ICD-10-CM

## 2021-08-18 DIAGNOSIS — R2681 Unsteadiness on feet: Secondary | ICD-10-CM

## 2021-08-18 NOTE — Patient Instructions (Signed)
Access Code: [email or web address] URL: [email or web address] Date: 08/18/2021 Prepared by: [facility]  Exercises Seated Gaze Stabilization with Head Rotation - 2-3 x daily - 5 x weekly - 3 sets - 10 reps - 30-60 sec hold Standing Romberg to 1/2 Tandem Stance - 1 x daily - 5 x weekly - 1 sets - 13 reps - 15 sec hold Romberg Stance - 1 x daily - 5 x weekly - 1 sets - 3 reps - 15 sec hold

## 2021-08-18 NOTE — Therapy (Signed)
[facility] [place], [place], [name], [address] Phone: [phone]   Fax:  [phone]  Physical Therapy Treatment  Patient Details  Name: Stacey Lawrence MRN: [id number] Date of Birth: Apr 09, 1951 Referring Provider (PT): Fidel Levy Date: 08/18/2021   PT End of Session - 08/18/21 1357     Visit Number 2    Number of Visits 12    Date for PT Re-Evaluation 09/24/21    Authorization Type [employer]    Progress Note Due on Visit 10    PT Start Time 1400    PT Stop Time 1446    PT Time Calculation (min) 46 min    Activity Tolerance Patient tolerated treatment well    Behavior During Therapy [facility] for tasks assessed/performed             Past Medical History:  Diagnosis Date   Anemia    hx   Anxiety    CAD (coronary artery disease)    a. 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 05/2013 [facility]: EF 77%, old small inferolat scar, no ischemia.   Common migraine with intractable migraine 06/16/2017   Depression    Fibromyalgia    GERD (gastroesophageal reflux disease)    hasn't started taking any meds   History of blood transfusion    54yrs ago   History of kidney stones    Hx of colonic polyps    IBS (irritable bowel syndrome)    constipation   Joint pain    Joint swelling    Migraine    "q other week" (02/04/2016)   Myocardial infarction ([place]) 2014   Numbness    right leg   Polycythemia    PONV (postoperative nausea and vomiting)    Raynaud's disease    Sciatica of right side 04/21/2020   Scoliosis    Shortness of breath dyspnea    with exertion   SI (sacroiliac) joint dysfunction    right   Wears glasses     Past Surgical History:  Procedure  Laterality Date   ABDOMINAL HYSTERECTOMY     partial   APPENDECTOMY     with explor. lap.   CARDIAC CATHETERIZATION     CHOLECYSTECTOMY  10/28/2002   lap.   COLONOSCOPY     DEBRIDEMENT TENNIS ELBOW     DIAGNOSTIC LAPAROSCOPY     DILATION AND CURETTAGE OF UTERUS     ELBOW SURGERY     golfer's elbow-bilateral   ESOPHAGOGASTRODUODENOSCOPY  09/10/2012   Procedure: ESOPHAGOGASTRODUODENOSCOPY (EGD);  Surgeon: Rogene Houston, MD;  [facility];  Service: Endoscopy;  Laterality: N/A;  730   EXCISION/RELEASE BURSA HIP Left 02/04/2016   Procedure: LEFT HIP TROCHANTERIC BURSECTOMY;  Surgeon: Mcarthur Rossetti, MD;  Location: [place];  Service: Orthopedics;  Laterality: Left;   I & D EXTREMITY  02/25/2012   Procedure: IRRIGATION AND DEBRIDEMENT EXTREMITY;  Surgeon: Roseanne Kaufman, MD;  Location: [place];  Service: Orthopedics;  Laterality: Right;  Irrigation and Debridement and Repair Right Thumb Nerve Laceration and Assoiated Structures    KNEE DEBRIDEMENT     right   LAPAROTOMY     LEFT HEART  CATHETERIZATION WITH CORONARY ANGIOGRAM N/A 01/16/2013   Procedure: LEFT HEART CATHETERIZATION WITH CORONARY ANGIOGRAM;  Surgeon: Jolaine Artist, MD;  Location: [facility] CATH LAB;  Service: Cardiovascular;  Laterality: N/A;   SACROILIAC JOINT FUSION Right 05/02/2019   Procedure: SACROILIAC JOINT FUSION;  Surgeon: Melina Schools, MD;  Location: [place];  Service: Orthopedics;  Laterality: Right;  SACROILIAC JOINT FUSION   STAPEDECTOMY     right   TONSILLECTOMY AND ADENOIDECTOMY     TRIGGER FINGER RELEASE  08/19/2011   Procedure: RELEASE TRIGGER FINGER/A-1 PULLEY;  Surgeon: Willa Frater III;  Location: [place];  Service: Orthopedics;  Laterality: Right;  right middle finger a-1 pulley release with tenosynovectomy   TROCHANTERIC BURSA EXCISION Right 02/04/2016   TUBAL LIGATION     TYMPANOPLASTY      There were no vitals filed for this visit.   Subjective Assessment - 08/18/21  1357     Subjective No other changes since eval.    Pertinent History PMH:  CAD, depression, anxiety, chronic migraine, fibromyalgia, IBS, MI, SI joint dysfunction, scoliosis, sciatica R side, L hip trochanteric bursectomy, SI joint fusion    Patient Stated Goals Pt's goals for therapy are to walk and not be dizzy; to walk and not be dizzy.    Currently in Pain? Yes    Pain Score 6     Pain Location Hip    Pain Orientation Right;Left    Pain Descriptors / Indicators Aching    Pain Onset More than a month ago    Pain Frequency Intermittent    Aggravating Factors  lifting something heavy, picking up objects    Pain Relieving Factors anti-inflammatory medications                     Vestibular Assessment - 08/18/21 0001       Oculomotor Exam   Comment VOR cancellation (self-regulated pace) x 5 reps   mild dizziness     Visual Acuity   Static Line6-7    Dynamic Line 5-6   1 line difference (pt makes 1 mistake in each line-reports dry eyes today and not being able to focus without drops).  No c/o symptoms     Positional Testing   Dix-Hallpike Dix-Hallpike Right;Dix-Hallpike Left    Sidelying Test Sidelying Right;Sidelying Left    Horizontal Canal Testing Horizontal Canal Right;Horizontal Canal Left      Dix-Hallpike Right   Dix-Hallpike Right Duration no dizziness    Dix-Hallpike Right Symptoms No nystagmus      Dix-Hallpike Left   Dix-Hallpike Left Duration no dizziness    Dix-Hallpike Left Symptoms No nystagmus      Sidelying Right   Sidelying Right Duration no c/o    Sidelying Right Symptoms No nystagmus   mild c/o dizziness upon return to sit (2-3/10), resolves in <10 seconds     Sidelying Left   Sidelying Left Duration no c/o    Sidelying Left Symptoms No nystagmus   slight dizziness upon return to sit; resolves in <10 seconds     Horizontal Canal Right   Horizontal Canal Right Duration no c/o    Horizontal Canal Right Symptoms Normal      Horizontal  Canal Left   Horizontal Canal Left Duration no c/o    Horizontal Canal Left Symptoms Normal                       Vestibular Treatment/Exercise - 08/18/21 0001  Vestibular Treatment/Exercise   Vestibular Treatment Provided Gaze    Gaze Exercises X1 Viewing Horizontal;X1 Viewing Vertical      X1 Viewing Horizontal   Foot Position seated    Reps 10    Comments reports dizzines/unsteadiness as 2-3/10      X1 Viewing Vertical   Foot Position seated    Reps 10    Comments reports dizziness as 2-3/10                Balance Exercises - 08/18/21 0001       Balance Exercises: Standing   Partial Tandem Stance Eyes open;Intermittent upper extremity support;2 reps;15 secs    Other Standing Exercises Wide BOS lateral weigthshifting x 10 reps; then narrowed stance lateral weightshifting x 10    Other Standing Exercises Comments Romberg stance 10 seconds, Eyes closed x 3 reps                PT Education - 08/18/21 1428     Education Details Discussed possibility for occupational therapy to address UE weakness, coordination; updates to HEP    Person(s) Educated Patient    Methods Explanation;Demonstration;Handout    Comprehension Verbalized understanding;Returned demonstration              PT Short Term Goals - 08/16/21 1432       PT SHORT TERM GOAL #1   Title Pt will be independent with HEP for improved strength, balance, transfers, and gait.  TARGET 09/10/2021    Time 4    Period Weeks    Status New      PT SHORT TERM GOAL #2   Title Pt will improve 5x sit<>stand to less than or equal to 13 sec to demonstrate improved functional strength and transfer efficiency.    Baseline 15.97 sec at eval    Time 4    Period Weeks    Status New      PT SHORT TERM GOAL #3   Title Pt will improve FGA score to at least 18/30 to decrease fall risk.    Baseline 14/30    Time 4    Period Weeks    Status New      PT SHORT TERM GOAL #4   Title Pt will  verbalize understanding of fall prevention in home environment.    Time 4    Period Weeks    Status New               PT Long Term Goals - 08/16/21 1436       PT LONG TERM GOAL #1   Title Pt will be independent with progression of HEP for improved strength, balance, transfers, and gait.  TARGET 09/24/2021    Time 6    Period Weeks    Status New      PT LONG TERM GOAL #2   Title Pt will improve 5x sit<>stand to less than or equal to 11.5 secto demonstrate improved functional strength and transfer efficiency.    Time 6    Period Weeks    Status New      PT LONG TERM GOAL #3   Title Pt will improve FGA score to at least 22/30 to decrease fall risk.    Time 6    Period Weeks    Status New      PT LONG TERM GOAL #4   Title Pt will report 3-4/10 or less dizziness/unsteadiness with head motions to improve overall balance.    Baseline 6-8/10  reports during eval    Time 6    Period Weeks    Status New      PT LONG TERM GOAL #5   Title Pt will perform condition 4 on MCTSIB (eyes closed, standing on foam) for 30 seconds with minimal to no sway to demo improved vestibular system use for balance.    Baseline 30 sec with increased posterior sway    Time 6    Period Weeks    Status New                   Plan - 08/18/21 1710     Clinical Impression Statement With remainder of ocular motor testing today, pt rates less dizziness/unsteadiness compared to eval.  Completed positional testing to rule out positional vertigo, with no nystagmus, no symptoms noted, except mild/brief dizziness upon return to sit.  Overall, pt's unsteadiness appears to be multi-system related, and will benefit from exercises to improve vision, vestibular system use for balance as well as hip strengthening for hip stability.    Personal Factors and Comorbidities Comorbidity 3+    Comorbidities PMH:  CAD, depression, anxiety, chronic migraine, fibromyalgia, IBS, MI, SI joint dysfunction, scoliosis,  sciatica R side, L hip trochanteric bursectomy, SI joint fusion    Examination-Activity Limitations Locomotion Level;Transfers;Stand;Caring for Others    Examination-Participation Restrictions Community Activity;Other;Occupation   [place] farm; potential return to work   Stability/Clinical Decision Making Evolving/Moderate complexity    Rehab Potential Good    PT Frequency 2x / week    PT Duration 6 weeks   including eval week   PT Treatment/Interventions ADLs/Self Care Home Management;Gait training;Stair training;Functional mobility training;Therapeutic activities;Therapeutic exercise;Balance training;Neuromuscular re-education;Patient/family education;Vestibular    PT Next Visit Plan Review HEP and continue to update HEP to address functional lower extremity and trunk strengthening, vestibular/ocular exercises to help with balance.    Recommended Other Services Ask pt about OT referral again and follow up with Dr. Krista Blue if pt agreeable    Consulted and Agree with Plan of Care Patient             Patient will benefit from skilled therapeutic intervention in order to improve the following deficits and impairments:  Abnormal gait, Difficulty walking, Decreased balance, Decreased mobility, Decreased strength  Visit Diagnosis: Unsteadiness on feet  Other abnormalities of gait and mobility     Problem List Patient Active Problem List   Diagnosis Date Noted   Gait abnormality 08/03/2021   Irritable bowel syndrome (IBS) 07/13/2021   Sciatica of right side 04/21/2020   Microvascular angina ([place]) 01/01/2019   Unstable angina ([facility])    Common migraine with intractable migraine 06/16/2017   Tremor 11/16/2016   Trochanteric bursitis of left hip 02/04/2016   Trochanteric bursitis of both hips 10/31/2015   Chest pain at rest 06/17/2015   Inferior myocardial infarction ([place]) 06/17/2015   Sprain of interphalangeal joint of left little finger 02/05/2014   Pain in joint, hand 02/05/2014    Midsternal chest pain 06/04/2013   CAD (coronary artery disease)    Fibromyalgia    GERD (gastroesophageal reflux disease)    Migraine headache    Acute non-STEMI < 8 weeks prior, subsequent admission after initial 01/16/2013   Radial nerve laceration 03/27/2012   Lack of coordination 03/27/2012   Muscle weakness (generalized) 03/27/2012    Delina Kruczek W., PT 08/18/2021, 5:14 PM  [facility] [address], [place], [name], [address] Phone: [phone]  Fax:  [phone]  Name: Stacey Lawrence MRN: [id number] Date of Birth: May 31, 1951

## 2021-08-20 ENCOUNTER — Encounter (HOSPITAL_COMMUNITY): Payer: Self-pay | Admitting: Internal Medicine

## 2021-08-23 ENCOUNTER — Encounter (INDEPENDENT_AMBULATORY_CARE_PROVIDER_SITE_OTHER): Payer: Self-pay | Admitting: *Deleted

## 2021-08-24 ENCOUNTER — Ambulatory Visit: Payer: Medicare HMO | Admitting: Physical Therapy

## 2021-08-25 ENCOUNTER — Encounter: Payer: Self-pay | Admitting: Physical Therapy

## 2021-08-25 ENCOUNTER — Ambulatory Visit: Payer: Medicare HMO | Attending: Neurology | Admitting: Physical Therapy

## 2021-08-25 ENCOUNTER — Other Ambulatory Visit: Payer: Self-pay

## 2021-08-25 DIAGNOSIS — R2689 Other abnormalities of gait and mobility: Secondary | ICD-10-CM | POA: Diagnosis not present

## 2021-08-25 DIAGNOSIS — M6281 Muscle weakness (generalized): Secondary | ICD-10-CM | POA: Insufficient documentation

## 2021-08-25 DIAGNOSIS — R2681 Unsteadiness on feet: Secondary | ICD-10-CM | POA: Diagnosis not present

## 2021-08-25 NOTE — Therapy (Signed)
[facility] [place], [place], [name], [address] Phone: [phone]   Fax:  [phone]  Physical Therapy Treatment  Patient Details  Name: Stacey Lawrence MRN: [id number] Date of Birth: 10-16-1950 Referring Provider (PT): Fidel Levy Date: 08/25/2021   PT End of Session - 08/25/21 0933     Visit Number 3    Number of Visits 12    Date for PT Re-Evaluation 09/24/21    Authorization Type [employer]    Progress Note Due on Visit 10    PT Start Time 0845    PT Stop Time 0930    PT Time Calculation (min) 45 min    Activity Tolerance Patient tolerated treatment well    Behavior During Therapy [facility] for tasks assessed/performed             Past Medical History:  Diagnosis Date   Anemia    hx   Anxiety    CAD (coronary artery disease)    a. 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 05/2013 [facility]: EF 77%, old small inferolat scar, no ischemia.   Common migraine with intractable migraine 06/16/2017   Depression    Fibromyalgia    GERD (gastroesophageal reflux disease)    hasn't started taking any meds   History of blood transfusion    74yrs ago   History of kidney stones    Hx of colonic polyps    IBS (irritable bowel syndrome)    constipation   Joint pain    Joint swelling    Migraine    "q other week" (02/04/2016)   Myocardial infarction ([place]) 2014   Numbness    right leg   Polycythemia    PONV (postoperative nausea and vomiting)    Raynaud's disease    Sciatica of right side 04/21/2020   Scoliosis    Shortness of breath dyspnea    with exertion   SI (sacroiliac) joint dysfunction    right   Wears glasses     Past Surgical History:  Procedure  Laterality Date   ABDOMINAL HYSTERECTOMY     partial   APPENDECTOMY     with explor. lap.   CARDIAC CATHETERIZATION     CHOLECYSTECTOMY  10/28/2002   lap.   COLONOSCOPY     COLONOSCOPY WITH PROPOFOL N/A 08/11/2021   Procedure: COLONOSCOPY WITH PROPOFOL;  Surgeon: Rogene Houston, MD;  [facility];  Service: Endoscopy;  Laterality: N/A;  8:30   DEBRIDEMENT TENNIS ELBOW     DIAGNOSTIC LAPAROSCOPY     DILATION AND CURETTAGE OF UTERUS     ELBOW SURGERY     golfer's elbow-bilateral   ESOPHAGOGASTRODUODENOSCOPY  09/10/2012   Procedure: ESOPHAGOGASTRODUODENOSCOPY (EGD);  Surgeon: Rogene Houston, MD;  [facility];  Service: Endoscopy;  Laterality: N/A;  730   EXCISION/RELEASE BURSA HIP Left 02/04/2016   Procedure: LEFT HIP TROCHANTERIC BURSECTOMY;  Surgeon: Mcarthur Rossetti, MD;  Location: [place];  Service: Orthopedics;  Laterality: Left;   I & D EXTREMITY  02/25/2012   Procedure: IRRIGATION AND DEBRIDEMENT EXTREMITY;  Surgeon: Roseanne Kaufman, MD;  Location: [place];  Service: Orthopedics;  Laterality: Right;  Irrigation and Debridement and Repair Right Thumb Nerve Laceration and Assoiated Structures    KNEE DEBRIDEMENT     right   LAPAROTOMY     LEFT HEART CATHETERIZATION WITH CORONARY ANGIOGRAM N/A 01/16/2013   Procedure: LEFT HEART CATHETERIZATION WITH CORONARY ANGIOGRAM;  Surgeon: Jolaine Artist, MD;  Location: [facility] CATH [facility];  Service: Cardiovascular;  Laterality: N/A;   POLYPECTOMY  08/11/2021   Procedure: POLYPECTOMY;  Surgeon: Rogene Houston, MD;  [facility];  Service: Endoscopy;;   SACROILIAC JOINT FUSION Right 05/02/2019   Procedure: SACROILIAC JOINT FUSION;  Surgeon: Melina Schools, MD;  Location: [place];  Service: Orthopedics;  Laterality: Right;  SACROILIAC JOINT FUSION   STAPEDECTOMY     right   TONSILLECTOMY AND ADENOIDECTOMY     TRIGGER FINGER RELEASE  08/19/2011   Procedure: RELEASE TRIGGER FINGER/A-1 PULLEY;  Surgeon: Willa Frater  III;  Location: [place];  Service: Orthopedics;  Laterality: Right;  right middle finger a-1 pulley release with tenosynovectomy   TROCHANTERIC BURSA EXCISION Right 02/04/2016   TUBAL LIGATION     TYMPANOPLASTY      There were no vitals filed for this visit.   Subjective Assessment - 08/25/21 0848     Subjective Denies any falls or changes.  "pretty good" regarding HEP    Pertinent History PMH:  CAD, depression, anxiety, chronic migraine, fibromyalgia, IBS, MI, SI joint dysfunction, scoliosis, sciatica R side, L hip trochanteric bursectomy, SI joint fusion    Patient Stated Goals Pt's goals for therapy are to walk and not be dizzy; to walk and not be dizzy.    Currently in Pain? No/denies    Pain Onset More than a month ago                  [facility] Adult PT Treatment/Exercise - 08/25/21 0001       Exercises   Exercises Knee/Hip      Knee/Hip Exercises: Aerobic   Nustep Load 6 all 4 extremities x 5 minutes with SPM>70      Knee/Hip Exercises: Standing   Knee Flexion Both;1 set;10 reps;AROM;Other (comment)   1.5 lb weight   Hip Flexion AROM;Stengthening;Both;1 set;10 reps;Knee bent;Other (comment)   1.5 ljb weight   Hip Abduction AROM;Stengthening;Both;1 set;10 reps;Knee straight;Other (comment)   1.5 lb weight   Hip Extension AROM;Stengthening;Both;1 set;10 reps;Knee straight;Other (comment)   1.5 lb weight   Functional Squat 1 set;10 reps;3 seconds    SLS Tandem stance  10 sec x 2 sets each side with intermittent UE support  10 sec x 2 sets each direction intermittent UE support     Knee/Hip Exercises: Supine   Bridges AROM;Strengthening;Both;2 sets;10 reps    Bridges with Clamshell AROM;Strengthening;Both;2 sets;10 reps    Other Supine Knee/Hip Exercises bil hip abd in hooklying with red band x 10 reps x 2 sets;then single leg x 10 reps x 2 sets in same position    Other Supine Knee/Hip Exercises marching x 10 reps x 2 sets with red theraband                  PT Short Term Goals - 08/16/21 1432       PT SHORT TERM GOAL #1   Title Pt will be independent with HEP for improved strength, balance, transfers, and gait.  TARGET 09/10/2021    Time 4    Period Weeks    Status New      PT SHORT TERM GOAL #  2   Title Pt will improve 5x sit<>stand to less than or equal to 13 sec to demonstrate improved functional strength and transfer efficiency.    Baseline 15.97 sec at eval    Time 4    Period Weeks    Status New      PT SHORT TERM GOAL #3   Title Pt will improve FGA score to at least 18/30 to decrease fall risk.    Baseline 14/30    Time 4    Period Weeks    Status New      PT SHORT TERM GOAL #4   Title Pt will verbalize understanding of fall prevention in home environment.    Time 4    Period Weeks    Status New               PT Long Term Goals - 08/16/21 1436       PT LONG TERM GOAL #1   Title Pt will be independent with progression of HEP for improved strength, balance, transfers, and gait.  TARGET 09/24/2021    Time 6    Period Weeks    Status New      PT LONG TERM GOAL #2   Title Pt will improve 5x sit<>stand to less than or equal to 11.5 secto demonstrate improved functional strength and transfer efficiency.    Time 6    Period Weeks    Status New      PT LONG TERM GOAL #3   Title Pt will improve FGA score to at least 22/30 to decrease fall risk.    Time 6    Period Weeks    Status New      PT LONG TERM GOAL #4   Title Pt will report 3-4/10 or less dizziness/unsteadiness with head motions to improve overall balance.    Baseline 6-8/10 reports during eval    Time 6    Period Weeks    Status New      PT LONG TERM GOAL #5   Title Pt will perform condition 4 on MCTSIB (eyes closed, standing on foam) for 30 seconds with minimal to no sway to demo improved vestibular system use for balance.    Baseline 30 sec with increased posterior sway    Time 6    Period Weeks    Status New                    Plan - 08/25/21 1349     Clinical Impression Statement Skilled session focused on balance and strengthening. Pt unsteady with SLS and Tandem stance.  Pt reports leg length discrepancy since age 4 and does have a lift for shoe. Asked pt to bring in lift to next session.  Cont per poc.    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  Try quadruped?  Review HEP and continue to update HEP to address functional lower extremity and trunk strengthening, vestibular/ocular exercises to help with balance.    Consulted and Agree with Plan of Care Patient             Patient will benefit from skilled therapeutic intervention in order to improve the following deficits and impairments:  Abnormal gait, Difficulty walking, Decreased balance, Decreased mobility, Decreased strength  Visit Diagnosis: Unsteadiness on feet  Other abnormalities of gait and mobility  Muscle weakness (generalized)     Problem List Patient Active Problem List   Diagnosis Date Noted   Gait abnormality 08/03/2021   Irritable bowel  syndrome (IBS) 07/13/2021   Sciatica of right side 04/21/2020   Microvascular angina ([place]) 01/01/2019   Unstable angina ([facility])    Common migraine with intractable migraine 06/16/2017   Tremor 11/16/2016   Trochanteric bursitis of left hip 02/04/2016   Trochanteric bursitis of both hips 10/31/2015   Chest pain at rest 06/17/2015   Inferior myocardial infarction ([place]) 06/17/2015   Sprain of interphalangeal joint of left little finger 02/05/2014   Pain in joint, hand 02/05/2014   Midsternal chest pain 06/04/2013   CAD (coronary artery disease)    Fibromyalgia    GERD (gastroesophageal reflux disease)    Migraine headache    Acute non-STEMI < 8 weeks prior, subsequent admission after initial 01/16/2013   Radial nerve laceration 03/27/2012   Lack of coordination 03/27/2012   Muscle weakness (generalized) 03/27/2012    Narda Bonds, PTA [place] 08/25/21 1:55 PM Phone: [phone] Fax: [facility] [address] [place], [name], [address] Phone: [phone]   Fax:  [phone]  Name: OANH DEVIVO MRN: [id number] Date of Birth: 1951/06/15

## 2021-08-26 ENCOUNTER — Ambulatory Visit: Payer: Medicare HMO | Admitting: Physical Therapy

## 2021-08-31 ENCOUNTER — Ambulatory Visit: Payer: Medicare HMO | Admitting: Physical Therapy

## 2021-08-31 ENCOUNTER — Encounter: Payer: Self-pay | Admitting: Physical Therapy

## 2021-08-31 ENCOUNTER — Other Ambulatory Visit: Payer: Self-pay

## 2021-08-31 DIAGNOSIS — M6281 Muscle weakness (generalized): Secondary | ICD-10-CM | POA: Diagnosis not present

## 2021-08-31 DIAGNOSIS — R2689 Other abnormalities of gait and mobility: Secondary | ICD-10-CM

## 2021-08-31 DIAGNOSIS — R2681 Unsteadiness on feet: Secondary | ICD-10-CM | POA: Diagnosis not present

## 2021-08-31 NOTE — Therapy (Signed)
Stacey Lawrence, Stacey Lawrence, [place], [place] Phone: [phone]   Fax:  [phone]  Physical Therapy Treatment  Patient Details  Name: Stacey Lawrence MRN: [id number] Date of Birth: [birth year] Referring Provider (PT): Fidel Levy Date: 08/31/2021   PT End of Session - 08/31/21 0937     Visit Number 4    Number of Visits 12    Date for PT Re-Evaluation 09/24/21    Authorization Type [employer]    Progress Note Due on Visit 10    PT Start Time 0845    PT Stop Time 0930    PT Time Calculation (min) 45 min    Activity Tolerance Patient tolerated treatment well    Behavior During Therapy [facility] for tasks assessed/performed             Past Medical History:  Diagnosis Date   Anemia    hx   Anxiety    CAD (coronary artery disease)    a. 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 05/2013 [facility]: EF 77%, old small inferolat scar, no ischemia.   Common migraine with intractable migraine 06/16/2017   Depression    Fibromyalgia    GERD (gastroesophageal reflux disease)    hasn't started taking any meds   History of blood transfusion    8yrs ago   History of kidney stones    Hx of colonic polyps    IBS (irritable bowel syndrome)    constipation   Joint pain    Joint swelling    Migraine    "q other week" (02/04/2016)   Myocardial infarction ([place]) 2014   Numbness    right leg   Polycythemia    PONV (postoperative nausea and vomiting)    Raynaud's disease    Sciatica of right side 04/21/2020   Scoliosis    Shortness of breath dyspnea    with exertion   SI (sacroiliac) joint dysfunction    right   Wears glasses     Past Surgical History:  Procedure  Laterality Date   ABDOMINAL HYSTERECTOMY     partial   APPENDECTOMY     with explor. lap.   CARDIAC CATHETERIZATION     CHOLECYSTECTOMY  10/28/2002   lap.   COLONOSCOPY     COLONOSCOPY WITH PROPOFOL N/A 08/11/2021   Procedure: COLONOSCOPY WITH PROPOFOL;  Surgeon: Rogene Houston, MD;  [facility];  Service: Endoscopy;  Laterality: N/A;  8:30   DEBRIDEMENT TENNIS ELBOW     DIAGNOSTIC LAPAROSCOPY     DILATION AND CURETTAGE OF UTERUS     ELBOW SURGERY     golfer's elbow-bilateral   ESOPHAGOGASTRODUODENOSCOPY  09/10/2012   Procedure: ESOPHAGOGASTRODUODENOSCOPY (EGD);  Surgeon: Rogene Houston, MD;  [facility];  Service: Endoscopy;  Laterality: N/A;  730   EXCISION/RELEASE BURSA HIP Left 02/04/2016   Procedure: LEFT HIP TROCHANTERIC BURSECTOMY;  Surgeon: Mcarthur Rossetti, MD;  Location: [place];  Service: Orthopedics;  Laterality: Left;   I & D EXTREMITY  02/25/2012   Procedure: IRRIGATION AND DEBRIDEMENT EXTREMITY;  Surgeon: Roseanne Kaufman, MD;  Location: [place];  Service: Orthopedics;  Laterality: Right;  Irrigation and Debridement and Repair Right Thumb Nerve Laceration and Assoiated Structures    KNEE DEBRIDEMENT     right   LAPAROTOMY     LEFT HEART CATHETERIZATION WITH CORONARY ANGIOGRAM N/A 01/16/2013   Procedure: LEFT HEART CATHETERIZATION WITH CORONARY ANGIOGRAM;  Surgeon: Jolaine Artist, MD;  Location: [facility] CATH LAB;  Service: Cardiovascular;  Laterality: N/A;   POLYPECTOMY  08/11/2021   Procedure: POLYPECTOMY;  Surgeon: Rogene Houston, MD;  [facility];  Service: Endoscopy;;   SACROILIAC JOINT FUSION Right 05/02/2019   Procedure: SACROILIAC JOINT FUSION;  Surgeon: Melina Schools, MD;  Location: [place];  Service: Orthopedics;  Laterality: Right;  SACROILIAC JOINT FUSION   STAPEDECTOMY     right   TONSILLECTOMY AND ADENOIDECTOMY     TRIGGER FINGER RELEASE  08/19/2011   Procedure: RELEASE TRIGGER FINGER/A-1 PULLEY;  Surgeon: Willa Frater  III;  Location: [place];  Service: Orthopedics;  Laterality: Right;  right middle finger a-1 pulley release with tenosynovectomy   TROCHANTERIC BURSA EXCISION Right 02/04/2016   TUBAL LIGATION     TYMPANOPLASTY      There were no vitals filed for this visit.   Subjective Assessment - 08/31/21 0849     Subjective Pt reports that she thinks things are getting better.  Is wearing orthotics.    Pertinent History PMH:  CAD, depression, anxiety, chronic migraine, fibromyalgia, IBS, MI, SI joint dysfunction, scoliosis, sciatica R side, L hip trochanteric bursectomy, SI joint fusion    Patient Stated Goals Pt's goals for therapy are to walk and not be dizzy; to walk and not be dizzy.    Currently in Pain? No/denies    Pain Onset More than a month ago                               [facility] Adult PT Treatment/Exercise - 08/31/21 0001       Exercises   Exercises Knee/Hip;Lumbar      Lumbar Exercises: Quadruped   Single Arm Raise Right;Left;10 reps;1 second    Straight Leg Raise 10 reps;1 second    Opposite Arm/Leg Raise Right arm/Left leg;Left arm/Right leg;10 reps;1 second    Other Quadruped Lumbar Exercises decreased hip stability      Knee/Hip Exercises: Aerobic   Nustep Load 5 all 4 extremities x 5 minutes with SPM>70      Knee/Hip Exercises: Standing   Heel Raises Both;2 sets;10 reps;Other (comment)   2# weight   Hip Flexion AROM;Stengthening;Both;1 set;10 reps;Knee bent;Other (comment)   2# weight   Hip Abduction AROM;Stengthening;Both;2 sets;10 reps;Knee straight;Other (comment)   2# weight   Hip Extension AROM;Stengthening;Both;2 sets;10 reps;Knee straight   2# weight   Lateral Step Up Both;2 sets;15 reps;Other (comment);Hand Hold: 1    Forward Step Up Both;2 sets;15 reps;Hand Hold: 1;Step Height: 6"   intermittent UE support for brief pds otherwise 1 UE   Step Down Both;2 sets;15 reps;Hand Hold: 1;Step Height: 6"   intermittent UE support    Functional Squat 1 set;20 reps                       PT Short Term Goals - 08/16/21 1432       PT SHORT TERM GOAL #1   Title Pt will be independent with HEP for improved strength, balance, transfers, and gait.  TARGET 09/10/2021    Time 4    Period  Weeks    Status New      PT SHORT TERM GOAL #2   Title Pt will improve 5x sit<>stand to less than or equal to 13 sec to demonstrate improved functional strength and transfer efficiency.    Baseline 15.97 sec at eval    Time 4    Period Weeks    Status New      PT SHORT TERM GOAL #3   Title Pt will improve FGA score to at least 18/30 to decrease fall risk.    Baseline 14/30    Time 4    Period Weeks    Status New      PT SHORT TERM GOAL #4   Title Pt will verbalize understanding of fall prevention in home environment.    Time 4    Period Weeks    Status New               PT Long Term Goals - 08/16/21 1436       PT LONG TERM GOAL #1   Title Pt will be independent with progression of HEP for improved strength, balance, transfers, and gait.  TARGET 09/24/2021    Time 6    Period Weeks    Status New      PT LONG TERM GOAL #2   Title Pt will improve 5x sit<>stand to less than or equal to 11.5 secto demonstrate improved functional strength and transfer efficiency.    Time 6    Period Weeks    Status New      PT LONG TERM GOAL #3   Title Pt will improve FGA score to at least 22/30 to decrease fall risk.    Time 6    Period Weeks    Status New      PT LONG TERM GOAL #4   Title Pt will report 3-4/10 or less dizziness/unsteadiness with head motions to improve overall balance.    Baseline 6-8/10 reports during eval    Time 6    Period Weeks    Status New      PT LONG TERM GOAL #5   Title Pt will perform condition 4 on MCTSIB (eyes closed, standing on foam) for 30 seconds with minimal to no sway to demo improved vestibular system use for balance.    Baseline 30 sec with increased posterior sway    Time 6     Period Weeks    Status New                   Plan - 08/31/21 9323     Clinical Impression Statement Pt presents to therapy with orthopedic shoes and thought she had a lift in the right one but did not have it in.  Pts gait appears to have improved just by observation of pt walking in/out of clinic.  Pt also agrees that her balance and gait are better.  Session focused on strengthening of trunk, hips and LE's along with balance.  Cont per poc.    Personal Factors and Comorbidities Comorbidity 3+    Comorbidities PMH:  CAD, depression, anxiety, chronic migraine, fibromyalgia, IBS, MI, SI joint dysfunction, scoliosis, sciatica R side, L hip trochanteric bursectomy, SI joint fusion    Examination-Activity Limitations Locomotion Level;Transfers;Stand;Caring for Others    Examination-Participation Restrictions Community Activity;Other;Occupation   [place]; potential return to work   Stability/Clinical Decision Making Evolving/Moderate complexity    Rehab Potential Good    PT Frequency 2x /  week    PT Duration 6 weeks   including eval week   PT Treatment/Interventions ADLs/Self Care Home Management;Gait training;Stair training;Functional mobility training;Therapeutic activities;Therapeutic exercise;Balance training;Neuromuscular re-education;Patient/family education;Vestibular    PT Next Visit Plan Did she bring in shoe lift for leg length discrepancy?  Probably need to add some higher level exercises for HEP.  Continue to update HEP to address functional lower extremity and trunk strengthening, vestibular/ocular exercises to help with balance.    Consulted and Agree with Plan of Care Patient             Patient will benefit from skilled therapeutic intervention in order to improve the following deficits and impairments:  Abnormal gait, Difficulty walking, Decreased balance, Decreased mobility, Decreased strength  Visit Diagnosis: Unsteadiness on feet  Other  abnormalities of gait and mobility  Muscle weakness (generalized)     Problem List Patient Active Problem List   Diagnosis Date Noted   Gait abnormality 08/03/2021   Irritable bowel syndrome (IBS) 07/13/2021   Sciatica of right side 04/21/2020   Microvascular angina ([place]) 01/01/2019   Unstable angina ([facility])    Common migraine with intractable migraine 06/16/2017   Tremor 11/16/2016   Trochanteric bursitis of left hip 02/04/2016   Trochanteric bursitis of both hips 10/31/2015   Chest pain at rest 06/17/2015   Inferior myocardial infarction ([place]) 06/17/2015   Sprain of interphalangeal joint of left little finger 02/05/2014   Pain in joint, hand 02/05/2014   Midsternal chest pain 06/04/2013   CAD (coronary artery disease)    Fibromyalgia    GERD (gastroesophageal reflux disease)    Migraine headache    Acute non-STEMI < 8 weeks prior, subsequent admission after initial 01/16/2013   Radial nerve laceration 03/27/2012   Lack of coordination 03/27/2012   Muscle weakness (generalized) 03/27/2012   Narda Bonds, PTA [place] 08/31/21 9:44 AM Phone: [phone] Fax: [facility] [address], [place], [place], [address] Phone: [phone]   Fax:  [phone]  Name: Stacey Lawrence MRN: [id number] Date of Birth: 29-Jun-1951

## 2021-09-02 ENCOUNTER — Other Ambulatory Visit: Payer: Self-pay

## 2021-09-02 ENCOUNTER — Ambulatory Visit: Payer: Medicare HMO | Admitting: Physical Therapy

## 2021-09-02 ENCOUNTER — Encounter: Payer: Self-pay | Admitting: Physical Therapy

## 2021-09-02 DIAGNOSIS — R2689 Other abnormalities of gait and mobility: Secondary | ICD-10-CM | POA: Diagnosis not present

## 2021-09-02 DIAGNOSIS — M6281 Muscle weakness (generalized): Secondary | ICD-10-CM | POA: Diagnosis not present

## 2021-09-02 DIAGNOSIS — R2681 Unsteadiness on feet: Secondary | ICD-10-CM

## 2021-09-02 NOTE — Therapy (Signed)
[facility] [place], [place], [name], [address] Phone: [phone]   Fax:  [phone]  Physical Therapy Treatment  Patient Details  Name: Stacey Lawrence MRN: [id number] Date of Birth: Feb 03, 1951 Referring Provider (PT): Fidel Levy Date: 09/02/2021   PT End of Session - 09/02/21 0951     Visit Number 5    Number of Visits 12    Date for PT Re-Evaluation 09/24/21    Authorization Type [employer]    Progress Note Due on Visit 10    PT Start Time 0931    PT Stop Time 1016    PT Time Calculation (min) 45 min    Activity Tolerance Patient tolerated treatment well    Behavior During Therapy [facility] for tasks assessed/performed             Past Medical History:  Diagnosis Date   Anemia    hx   Anxiety    CAD (coronary artery disease)    a. 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 05/2013 [facility]: EF 77%, old small inferolat scar, no ischemia.   Common migraine with intractable migraine 06/16/2017   Depression    Fibromyalgia    GERD (gastroesophageal reflux disease)    hasn't started taking any meds   History of blood transfusion    13yrs ago   History of kidney stones    Hx of colonic polyps    IBS (irritable bowel syndrome)    constipation   Joint pain    Joint swelling    Migraine    "q other week" (02/04/2016)   Myocardial infarction ([place]) 2014   Numbness    right leg   Polycythemia    PONV (postoperative nausea and vomiting)    Raynaud's disease    Sciatica of right side 04/21/2020   Scoliosis    Shortness of breath dyspnea    with exertion   SI (sacroiliac) joint dysfunction    right   Wears glasses     Past Surgical History:  Procedure  Laterality Date   ABDOMINAL HYSTERECTOMY     partial   APPENDECTOMY     with explor. lap.   CARDIAC CATHETERIZATION     CHOLECYSTECTOMY  10/28/2002   lap.   COLONOSCOPY     COLONOSCOPY WITH PROPOFOL N/A 08/11/2021   Procedure: COLONOSCOPY WITH PROPOFOL;  Surgeon: Rogene Houston, MD;  [facility];  Service: Endoscopy;  Laterality: N/A;  8:30   DEBRIDEMENT TENNIS ELBOW     DIAGNOSTIC LAPAROSCOPY     DILATION AND CURETTAGE OF UTERUS     ELBOW SURGERY     golfer's elbow-bilateral   ESOPHAGOGASTRODUODENOSCOPY  09/10/2012   Procedure: ESOPHAGOGASTRODUODENOSCOPY (EGD);  Surgeon: Rogene Houston, MD;  [facility];  Service: Endoscopy;  Laterality: N/A;  730   EXCISION/RELEASE BURSA HIP Left 02/04/2016   Procedure: LEFT HIP TROCHANTERIC BURSECTOMY;  Surgeon: Mcarthur Rossetti, MD;  Location: [place];  Service: Orthopedics;  Laterality: Left;   I & D EXTREMITY  02/25/2012   Procedure: IRRIGATION AND DEBRIDEMENT EXTREMITY;  Surgeon: Roseanne Kaufman, MD;  Location: [place];  Service: Orthopedics;  Laterality: Right;  Irrigation and Debridement and Repair Right Thumb Nerve Laceration and Assoiated Structures    KNEE DEBRIDEMENT     right   LAPAROTOMY     LEFT HEART CATHETERIZATION WITH CORONARY ANGIOGRAM N/A 01/16/2013   Procedure: LEFT HEART CATHETERIZATION WITH CORONARY ANGIOGRAM;  Surgeon: Jolaine Artist, MD;  Location: [facility] CATH LAB;  Service: Cardiovascular;  Laterality: N/A;   POLYPECTOMY  08/11/2021   Procedure: POLYPECTOMY;  Surgeon: Rogene Houston, MD;  [facility];  Service: Endoscopy;;   SACROILIAC JOINT FUSION Right 05/02/2019   Procedure: SACROILIAC JOINT FUSION;  Surgeon: Melina Schools, MD;  Location: [place];  Service: Orthopedics;  Laterality: Right;  SACROILIAC JOINT FUSION   STAPEDECTOMY     right   TONSILLECTOMY AND ADENOIDECTOMY     TRIGGER FINGER RELEASE  08/19/2011   Procedure: RELEASE TRIGGER FINGER/A-1 PULLEY;  Surgeon: Willa Frater  III;  Location: [place];  Service: Orthopedics;  Laterality: Right;  right middle finger a-1 pulley release with tenosynovectomy   TROCHANTERIC BURSA EXCISION Right 02/04/2016   TUBAL LIGATION     TYMPANOPLASTY      There were no vitals filed for this visit.   Subjective Assessment - 09/02/21 0931     Subjective Fibromyalgia is rearing its head today.  With the cold and rainy weather, it aggravates it.    Pertinent History PMH:  CAD, depression, anxiety, chronic migraine, fibromyalgia, IBS, MI, SI joint dysfunction, scoliosis, sciatica R side, L hip trochanteric bursectomy, SI joint fusion    Patient Stated Goals Pt's goals for therapy are to walk and not be dizzy; to walk and not be dizzy.    Currently in Pain? Yes    Pain Score 6     Pain Location Generalized    Pain Descriptors / Indicators Aching    Pain Type Chronic pain    Pain Onset More than a month ago    Pain Frequency Intermittent    Aggravating Factors  rainy, cold weather    Pain Relieving Factors anti-inflammatory medications                               [facility] Adult PT Treatment/Exercise - 09/02/21 0001       Knee/Hip Exercises: Stretches   Active Hamstring Stretch Right;Left;3 reps;30 seconds    Active Hamstring Stretch Limitations seated edge of mat, foot propped on floor    Gastroc Stretch Right;Left;3 reps;30 seconds      Knee/Hip Exercises: Aerobic   Nustep Load 5 all 4 extremities x 6 minutes with SPM>70      Knee/Hip Exercises: Standing   Heel Raises --    Lateral Step Up Both;2 sets;15 reps;Other (comment);Hand Hold: 1    Forward Step Up Both;2 sets;15 reps;Hand Hold: 1;Step Height: 6"    Forward Step Up Limitations intermittent UE support with RLE step up, then light UE support with LLE step up    Step Down Both;2 sets;15 reps;Hand Hold: 1;Step Height: 6"    Step Down Limitations light BUE support at rails                 Balance Exercises - 09/02/21  0001       Balance Exercises: Standing   Standing Eyes Opened Wide (BOA);Narrow base of support (BOS);Foam/compliant surface;Limitations    Standing Eyes Opened Limitations Head turns/nods x 5    Standing Eyes Closed Wide (BOA);Narrow base of support (BOS);Foam/compliant surface;1  rep;10 secs    Tandem Gait Forward;Retro;Upper extremity support;3 reps    Retro Gait 3 reps;Limitations    Retro Gait Limitations Forward/back along counter    Marching Solid surface;Intermittent upper extremity assist;Dynamic;Forwards;Retro;Limitations    Marching Limitations 3 reps along counter; progressing to tandem march forward and back along counter wtih light UE support    Heel Raises Both;10 reps   on airex   Toe Raise 10 reps;Both   on airex   Other Standing Exercises Forward walking 50 ft 4 reps with cues for arm swing, then last 50 ft head turns side to side with gait; no overt LOB                PT Education - 09/02/21 1019     Education Details Additions to HEP-see instructions    Person(s) Educated Patient    Methods Explanation;Demonstration;Handout    Comprehension Verbalized understanding;Returned demonstration              PT Short Term Goals - 08/16/21 1432       PT SHORT TERM GOAL #1   Title Pt will be independent with HEP for improved strength, balance, transfers, and gait.  TARGET 09/10/2021    Time 4    Period Weeks    Status New      PT SHORT TERM GOAL #2   Title Pt will improve 5x sit<>stand to less than or equal to 13 sec to demonstrate improved functional strength and transfer efficiency.    Baseline 15.97 sec at eval    Time 4    Period Weeks    Status New      PT SHORT TERM GOAL #3   Title Pt will improve FGA score to at least 18/30 to decrease fall risk.    Baseline 14/30    Time 4    Period Weeks    Status New      PT SHORT TERM GOAL #4   Title Pt will verbalize understanding of fall prevention in home environment.    Time 4    Period Weeks     Status New               PT Long Term Goals - 08/16/21 1436       PT LONG TERM GOAL #1   Title Pt will be independent with progression of HEP for improved strength, balance, transfers, and gait.  TARGET 09/24/2021    Time 6    Period Weeks    Status New      PT LONG TERM GOAL #2   Title Pt will improve 5x sit<>stand to less than or equal to 11.5 secto demonstrate improved functional strength and transfer efficiency.    Time 6    Period Weeks    Status New      PT LONG TERM GOAL #3   Title Pt will improve FGA score to at least 22/30 to decrease fall risk.    Time 6    Period Weeks    Status New      PT LONG TERM GOAL #4   Title Pt will report 3-4/10 or less dizziness/unsteadiness with head motions to improve overall balance.    Baseline 6-8/10 reports during eval    Time 6    Period Weeks    Status New      PT LONG TERM GOAL #5   Title Pt will perform condition 4 on MCTSIB (eyes closed, standing on foam) for 30 seconds with  minimal to no sway to demo improved vestibular system use for balance.    Baseline 30 sec with increased posterior sway    Time 6    Period Weeks    Status New                   Plan - 09/02/21 1020     Clinical Impression Statement Skilled PT session focused on funcitonal strengthening and dynamic balance today.  With step ups, pt requires light UE support to aid in SLS stability.  With dynamic exercises at counter, pt requires light UE for tandem and tandem march as well as compliant surface activities.  Added to HEP today for balance activities.  Pt does need cues for relaxed arm swing with gait.  She will continue to benefit from skilled PT to improve balance, strength, gait.    Personal Factors and Comorbidities Comorbidity 3+    Comorbidities PMH:  CAD, depression, anxiety, chronic migraine, fibromyalgia, IBS, MI, SI joint dysfunction, scoliosis, sciatica R side, L hip trochanteric bursectomy, SI joint fusion    Examination-Activity  Limitations Locomotion Level;Transfers;Stand;Caring for Others    Examination-Participation Restrictions Community Activity;Other;Occupation   [place] farm; potential return to work   Stability/Clinical Decision Making Evolving/Moderate complexity    Rehab Potential Good    PT Frequency 2x / week    PT Duration 6 weeks   including eval week   PT Treatment/Interventions ADLs/Self Care Home Management;Gait training;Stair training;Functional mobility training;Therapeutic activities;Therapeutic exercise;Balance training;Neuromuscular re-education;Patient/family education;Vestibular    PT Next Visit Plan Need to check STGs next week and schedule more appointments towards LTGs if pt agreeable.  Did she bring in shoe lift for leg length discrepancy? (09/02/21-pt could not find).  Check additions to HEP and further update to include compliant surface as needed.  Continue functional lower extremity and trunk strengthening, vestibular/ocular exercises to help with balance.    Consulted and Agree with Plan of Care Patient             Patient will benefit from skilled therapeutic intervention in order to improve the following deficits and impairments:  Abnormal gait, Difficulty walking, Decreased balance, Decreased mobility, Decreased strength  Visit Diagnosis: Muscle weakness (generalized)  Unsteadiness on feet  Other abnormalities of gait and mobility     Problem List Patient Active Problem List   Diagnosis Date Noted   Gait abnormality 08/03/2021   Irritable bowel syndrome (IBS) 07/13/2021   Sciatica of right side 04/21/2020   Microvascular angina ([place]) 01/01/2019   Unstable angina ([facility])    Common migraine with intractable migraine 06/16/2017   Tremor 11/16/2016   Trochanteric bursitis of left hip 02/04/2016   Trochanteric bursitis of both hips 10/31/2015   Chest pain at rest 06/17/2015   Inferior myocardial infarction ([place]) 06/17/2015   Sprain of interphalangeal joint of left  little finger 02/05/2014   Pain in joint, hand 02/05/2014   Midsternal chest pain 06/04/2013   CAD (coronary artery disease)    Fibromyalgia    GERD (gastroesophageal reflux disease)    Migraine headache    Acute non-STEMI < 8 weeks prior, subsequent admission after initial 01/16/2013   Radial nerve laceration 03/27/2012   Lack of coordination 03/27/2012   Muscle weakness (generalized) 03/27/2012    Amaura Authier W., PT 09/02/2021, 10:26 AM  [facility] [address], [place], [name], [address] Phone: [phone]   Fax:  [phone]  Name: Stacey Lawrence MRN: [id number] Date of  Birth: May 01, 1951

## 2021-09-02 NOTE — Patient Instructions (Signed)
Along the counter:   1)  Forward/backward walking, 3-5 reps  2)  Forward/backward marching, 3-5 reps  3) Forward walking with head turns looking at targets every few steps, 3-5 reps (can be along counter or hallway)

## 2021-09-07 ENCOUNTER — Other Ambulatory Visit: Payer: Self-pay

## 2021-09-07 ENCOUNTER — Ambulatory Visit: Payer: Medicare HMO | Admitting: Physical Therapy

## 2021-09-07 ENCOUNTER — Encounter: Payer: Self-pay | Admitting: Physical Therapy

## 2021-09-07 DIAGNOSIS — R2689 Other abnormalities of gait and mobility: Secondary | ICD-10-CM | POA: Diagnosis not present

## 2021-09-07 DIAGNOSIS — R2681 Unsteadiness on feet: Secondary | ICD-10-CM | POA: Diagnosis not present

## 2021-09-07 DIAGNOSIS — M6281 Muscle weakness (generalized): Secondary | ICD-10-CM | POA: Diagnosis not present

## 2021-09-07 NOTE — Therapy (Signed)
[facility] [place], [place], [name], [address] Phone: [phone]   Fax:  [phone]  Physical Therapy Treatment  Patient Details  Name: Stacey Lawrence MRN: [id number] Date of Birth: Dec 15, 1950 Referring Provider (PT): Fidel Levy Date: 09/07/2021   PT End of Session - 09/07/21 0855     Visit Number 6    Number of Visits 12    Date for PT Re-Evaluation 09/24/21    Authorization Type [employer]    Progress Note Due on Visit 10    PT Start Time 0852    PT Stop Time 0931    PT Time Calculation (min) 39 min    Activity Tolerance Patient tolerated treatment well    Behavior During Therapy [facility] for tasks assessed/performed             Past Medical History:  Diagnosis Date   Anemia    hx   Anxiety    CAD (coronary artery disease)    a. 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 05/2013 [facility]: EF 77%, old small inferolat scar, no ischemia.   Common migraine with intractable migraine 06/16/2017   Depression    Fibromyalgia    GERD (gastroesophageal reflux disease)    hasn't started taking any meds   History of blood transfusion    223yrago   History of kidney stones    Hx of colonic polyps    IBS (irritable bowel syndrome)    constipation   Joint pain    Joint swelling    Migraine    "q other week" (02/04/2016)   [facility] [place]   Numbness    right leg   Polycythemia    PONV (postoperative nausea and vomiting)    Raynaud's disease    Sciatica of right side 04/21/2020   Scoliosis    Shortness of breath dyspnea    with exertion   SI (sacroiliac) joint dysfunction    right   Wears glasses     Past Surgical History:  Procedure  Laterality Date   ABDOMINAL HYSTERECTOMY     partial   APPENDECTOMY     with explor. lap.   CARDIAC CATHETERIZATION     CHOLECYSTECTOMY  10/28/2002   lap.   COLONOSCOPY     COLONOSCOPY WITH PROPOFOL N/A 08/11/2021   Procedure: COLONOSCOPY WITH PROPOFOL;  Surgeon: ReRogene HoustonMD;  [facility];  Service: Endoscopy;  Laterality: N/A;  8:30   DEBRIDEMENT TENNIS ELBOW     DIAGNOSTIC LAPAROSCOPY     DILATION AND CURETTAGE OF UTERUS     ELBOW SURGERY     golfer's elbow-bilateral   ESOPHAGOGASTRODUODENOSCOPY  09/10/2012   Procedure: ESOPHAGOGASTRODUODENOSCOPY (EGD);  Surgeon: NaRogene HoustonMD;  [facility];  Service: Endoscopy;  Laterality: N/A;  730   EXCISION/RELEASE BURSA HIP Left 02/04/2016   Procedure: LEFT HIP TROCHANTERIC BURSECTOMY;  Surgeon: ChMcarthur RossettiMD;  [facility];  Laterality: Left;   I & D EXTREMITY  02/25/2012   Procedure: IRRIGATION AND DEBRIDEMENT EXTREMITY;  Surgeon: WiRoseanne KaufmanMD;  [facility]: [facility];  Laterality: Right;  Irrigation and Debridement and Repair Right Thumb Nerve Laceration and Assoiated Structures    KNEE DEBRIDEMENT     right   LAPAROTOMY     LEFT HEART CATHETERIZATION WITH CORONARY ANGIOGRAM N/A 01/16/2013   Procedure: LEFT HEART CATHETERIZATION WITH CORONARY ANGIOGRAM;  Surgeon: Jolaine Artist, MD;  Location: [facility] CATH LAB;  Service: Cardiovascular;  Laterality: N/A;   POLYPECTOMY  08/11/2021   Procedure: POLYPECTOMY;  Surgeon: Rogene Houston, MD;  [facility];  Service: Endoscopy;;   SACROILIAC JOINT FUSION Right 05/02/2019   Procedure: SACROILIAC JOINT FUSION;  Surgeon: Melina Schools, MD;  Location: [facility];  Service: Orthopedics;  Laterality: Right;  SACROILIAC JOINT FUSION   STAPEDECTOMY     right   TONSILLECTOMY AND ADENOIDECTOMY     TRIGGER FINGER RELEASE  08/19/2011   Procedure: RELEASE TRIGGER FINGER/A-1 PULLEY;  Surgeon: Willa Frater  III;  Location: [facility];  Service: Orthopedics;  Laterality: Right;  right middle finger a-1 pulley release with tenosynovectomy   TROCHANTERIC BURSA EXCISION Right 02/04/2016   TUBAL LIGATION     TYMPANOPLASTY      There were no vitals filed for this visit.   Subjective Assessment - 09/07/21 0854     Subjective Very tired today.  Fibromyalgia still kicked in.  Dizziness has improved alot and I'm not tripping as much.    Pertinent History PMH:  CAD, depression, anxiety, chronic migraine, fibromyalgia, IBS, MI, SI joint dysfunction, scoliosis, sciatica R side, L hip trochanteric bursectomy, SI joint fusion    Patient Stated Goals Pt's goals for therapy are to walk and not be dizzy; to walk and not be dizzy.    Currently in Pain? Yes    Pain Score 8     Pain Location Generalized    Pain Orientation Right;Left    Pain Descriptors / Indicators Aching    Pain Onset More than a month ago    Pain Frequency Intermittent    Aggravating Factors  rainy, cold weather, sitting too much    Pain Relieving Factors anti-inflammatory medications            Reviewed HEP additions from last visit:  forward/back walking, forward/back marching, gait with head turns/nods.  Pt return demo understanding.    [facility] PT Assessment - 09/07/21 0001       Functional Gait  Assessment   Gait assessed  Yes    Gait Level Surface Walks 20 ft in less than 5.5 sec, no assistive devices, good speed, no evidence for imbalance, normal gait pattern, deviates no more than 6 in outside of the 12 in walkway width.   5.38   Change in Gait Speed Able to smoothly change walking speed without loss of balance or gait deviation. Deviate no more than 6 in outside of the 12 in walkway width.    Gait with Horizontal Head Turns Performs head turns smoothly with slight change in gait velocity (eg, minor disruption to smooth gait path), deviates 6-10 in outside 12 in walkway width, or uses an assistive device.   7.5    Gait with Vertical Head Turns Performs task with slight change in gait velocity (eg, minor disruption to smooth gait path), deviates 6 - 10 in outside 12 in walkway width or uses assistive device   6.69   Gait and Pivot Turn Pivot turns safely within 3 sec and stops quickly with no loss of balance.    Step Over Obstacle Is able to step over one  shoe box (4.5 in total height) without changing gait speed. No evidence of imbalance.    Gait with Narrow Base of Support Is able to ambulate for 10 steps heel to toe with no staggering.    Gait with Eyes Closed Walks 20 ft, uses assistive device, slower speed, mild gait deviations, deviates 6-10 in outside 12 in walkway width. Ambulates 20 ft in less than 9 sec but greater than 7 sec.   7.81   Ambulating Backwards Walks 20 ft, no assistive devices, good speed, no evidence for imbalance, normal gait   6.81   Steps Alternating feet, must use rail.    Total Score 25    FGA comment: Improved from 14/30                           [facility] Adult PT Treatment/Exercise - 09/07/21 0001       Transfers   Transfers Sit to Stand;Stand to Sit    Sit to Stand 6: Modified independent (Device/Increase time);Without upper extremity assist;From chair/3-in-1    Five time sit to stand comments  11.56    Stand to Sit 6: Modified independent (Device/Increase time);Without upper extremity assist;To chair/3-in-1      High Level Balance   High Level Balance Comments Standing on foam EO x 30 sec;             Standing EO on foam at counter:  head turns x 5, head nods x 5 Standing EC on foam at counter for support, head steady x 10 sec, then head turns x 5, head nods x 5 (Added to HEP)        PT Education - 09/07/21 0930     Education Details Updates to HEP; progress towards goals, POC; pt is making great progress and discussed plans for d/c next visit.    Person(s) Educated Patient    Methods Explanation;Demonstration;Handout    Comprehension  Verbalized understanding;Returned demonstration              PT Short Term Goals - 09/07/21 0858       PT SHORT TERM GOAL #1   Title Pt will be independent with HEP for improved strength, balance, transfers, and gait.  TARGET 09/10/2021    Time 4    Period Weeks    Status New      PT SHORT TERM GOAL #2   Title Pt will improve 5x sit<>stand to less than or equal to 13 sec to demonstrate improved functional strength and transfer efficiency.    Baseline 15.97 sec at eval; 11.56 sec 09/07/2021    Time 4    Period Weeks    Status Achieved      PT SHORT TERM GOAL #3   Title Pt will improve FGA score to at least 18/30 to decrease fall risk.    Baseline 14/30; FGA score 25/30 09/07/21    Time 4    Period Weeks    Status Achieved      PT SHORT TERM GOAL #4   Title Pt will verbalize understanding of fall prevention in home environment.    Time 4    Period Weeks    Status New               PT Long Term Goals - 09/07/21 0905       PT LONG TERM GOAL #1   Title Pt will be independent with progression of HEP for improved strength, balance, transfers,  and gait.  TARGET 09/24/2021    Time 6    Period Weeks    Status New      PT LONG TERM GOAL #2   Title Pt will improve 5x sit<>stand to less than or equal to 11.5 secto demonstrate improved functional strength and transfer efficiency.    Baseline 11.56 sec 09/07/21    Time 6    Period Weeks    Status Achieved      PT LONG TERM GOAL #3   Title Pt will improve FGA score to at least 22/30 to decrease fall risk.    Baseline FGA score 25/30 09/07/21    Time 6    Period Weeks    Status Achieved      PT LONG TERM GOAL #4   Title Pt will report 3-4/10 or less dizziness/unsteadiness with head motions to improve overall balance.    Baseline 6-8/10 reports during eval; rates unsteadiness as 2-3/10    Time 6    Period Weeks    Status Achieved      PT LONG TERM GOAL #5   Title Pt will perform condition 4 on MCTSIB (eyes  closed, standing on foam) for 30 seconds with minimal to no sway to demo improved vestibular system use for balance.    Baseline 30 sec with increased posterior sway; 30 sec with minimal sway 09/07/21    Time 6    Period Weeks    Status Achieved                   Plan - 09/07/21 0931     Clinical Impression Statement Began assessing STGs this visit, with pt making excellent progress.  Pt has met STGs 2 and 3 for improved 5x sit<>stand and FGA.  She has met LTGs 2-5 as well, noting overall improved dizziness with functional mobility.  Added to HEP today to address compliant surfaces.  Pt is on target for discharge likely next visit, as she is making excellent progress with functional mobility and is reporting decreased dizziness symtpoms with mobility activities.    Personal Factors and Comorbidities Comorbidity 3+    Comorbidities PMH:  CAD, depression, anxiety, chronic migraine, fibromyalgia, IBS, MI, SI joint dysfunction, scoliosis, sciatica R side, L hip trochanteric bursectomy, SI joint fusion    Examination-Activity Limitations Locomotion Level;Transfers;Stand;Caring for Others    Examination-Participation Restrictions Community Activity;Other;Occupation   [place] potential return to work   Stability/Clinical Decision Making Evolving/Moderate complexity    Rehab Potential Good    PT Frequency 2x / week    PT Duration 6 weeks   including eval week   PT Treatment/Interventions ADLs/Self Care Home Management;Gait training;Stair training;Functional mobility training;Therapeutic activities;Therapeutic exercise;Balance training;Neuromuscular re-education;Patient/family education;Vestibular    PT Next Visit Plan Check additions to HEP; check remaining STGs and LTGs (HEP and fall prevention).  Pt in agreement for d/c next visit.    Consulted and Agree with Plan of Care Patient             Patient will benefit from skilled therapeutic intervention in order to improve  the following deficits and impairments:  Abnormal gait, Difficulty walking, Decreased balance, Decreased mobility, Decreased strength  Visit Diagnosis: Unsteadiness on feet  Other abnormalities of gait and mobility  Muscle weakness (generalized)     Problem List Patient Active Problem List   Diagnosis Date Noted   Gait abnormality 08/03/2021   Irritable bowel syndrome (IBS) 07/13/2021   Sciatica of right side 04/21/2020  Microvascular angina ([place]) 01/01/2019   Unstable angina ([facility])    Common migraine with intractable migraine 06/16/2017   Tremor 11/16/2016   Trochanteric bursitis of left hip 02/04/2016   Trochanteric bursitis of both hips 10/31/2015   Chest pain at rest 06/17/2015   Inferior myocardial infarction [facility]) 06/17/2015   Sprain of interphalangeal joint of left little finger 02/05/2014   Pain in joint, hand 02/05/2014   Midsternal chest pain 06/04/2013   CAD (coronary artery disease)    Fibromyalgia    GERD (gastroesophageal reflux disease)    Migraine headache    Acute non-STEMI < 8 weeks prior, subsequent admission after initial 01/16/2013   Radial nerve laceration 03/27/2012   Lack of coordination 03/27/2012   Muscle weakness (generalized) 03/27/2012    Kali Deadwyler W., PT 09/07/2021, 9:34 AM  [facility] [address] [place], [name], [address] Phone: [phone]   Fax:  [phone]  Name: Stacey Lawrence MRN: [id number] Date of Birth: 02-Apr-1951

## 2021-09-07 NOTE — Patient Instructions (Signed)
Access Code: [email or web address] URL: [email or web address] Date: 09/07/2021 Prepared by: [facility]  Exercises Standing Romberg to 1/2 Tandem Stance - 1 x daily - 5 x weekly - 1 sets - 13 reps - 15 sec hold Romberg Stance - 1 x daily - 5 x weekly - 1 sets - 3 reps - 15 sec hold Standing Gaze Stabilization with Head Rotation - 2-3 x daily - 7 x weekly - 1 sets - 3 reps Standing on Foam Pad - 1 x daily - 5 x weekly - 1 sets - 10 reps

## 2021-09-09 ENCOUNTER — Encounter: Payer: Self-pay | Admitting: Physical Therapy

## 2021-09-09 ENCOUNTER — Other Ambulatory Visit: Payer: Self-pay

## 2021-09-09 ENCOUNTER — Ambulatory Visit: Payer: Medicare HMO | Admitting: Physical Therapy

## 2021-09-09 DIAGNOSIS — R2689 Other abnormalities of gait and mobility: Secondary | ICD-10-CM | POA: Diagnosis not present

## 2021-09-09 DIAGNOSIS — M6281 Muscle weakness (generalized): Secondary | ICD-10-CM | POA: Diagnosis not present

## 2021-09-09 DIAGNOSIS — R2681 Unsteadiness on feet: Secondary | ICD-10-CM | POA: Diagnosis not present

## 2021-09-09 NOTE — Therapy (Deleted)
[facility] [address], [place], [name], [address] Phone: [phone]   Fax:  [phone]  Physical Therapy Treatment  Patient Details  Name: Stacey Lawrence MRN: [id number] Date of Birth: 09/16/1951 Referring Provider (PT): Krista Blue   Encounter Date: 09/09/2021   PT End of Session - 09/09/21 0959     Visit Number 7    Number of Visits 12    Date for PT Re-Evaluation 09/24/21    Authorization Type [employer]    Progress Note Due on Visit 10    PT Start Time 0845    PT Stop Time 0930    PT Time Calculation (min) 45 min    Activity Tolerance Patient tolerated treatment well    Behavior During Therapy [facility] for tasks assessed/performed             Past Medical History:  Diagnosis Date   Anemia    hx   Anxiety    CAD (coronary artery disease)    a. 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 05/2013 [facility]: EF 77%, old small inferolat scar, no ischemia.   Common migraine with intractable migraine 06/16/2017   Depression    Fibromyalgia    GERD (gastroesophageal reflux disease)    hasn't started taking any meds   History of blood transfusion    234yrago   History of kidney stones    Hx of colonic polyps    IBS (irritable bowel syndrome)    constipation   Joint pain    Joint swelling    Migraine    "q other week" (02/04/2016)   [id number]   Numbness    right leg   Polycythemia    PONV (postoperative nausea and vomiting)    Raynaud's disease    Sciatica of right side 04/21/2020   Scoliosis    Shortness of breath dyspnea    with exertion   SI (sacroiliac) joint dysfunction    right   Wears glasses     Past Surgical History:  Procedure  Laterality Date   ABDOMINAL HYSTERECTOMY     partial   APPENDECTOMY     with explor. lap.   CARDIAC CATHETERIZATION     CHOLECYSTECTOMY  10/28/2002   lap.   COLONOSCOPY     COLONOSCOPY WITH PROPOFOL N/A 08/11/2021   Procedure: COLONOSCOPY WITH PROPOFOL;  Surgeon: ReRogene HoustonMD;  [facility];  Service: Endoscopy;  Laterality: N/A;  8:30   DEBRIDEMENT TENNIS ELBOW     DIAGNOSTIC LAPAROSCOPY     DILATION AND CURETTAGE OF UTERUS     ELBOW SURGERY     golfer's elbow-bilateral   ESOPHAGOGASTRODUODENOSCOPY  09/10/2012   Procedure: ESOPHAGOGASTRODUODENOSCOPY (EGD);  Surgeon: NaRogene HoustonMD;  [facility];  Service: Endoscopy;  Laterality: N/A;  730   EXCISION/RELEASE BURSA HIP Left 02/04/2016   Procedure: LEFT HIP TROCHANTERIC BURSECTOMY;  Surgeon: ChMcarthur RossettiMD;  [facility] Service: [facility]opedics;  Laterality: Left;   I & D EXTREMITY  02/25/2012   Procedure: IRRIGATION AND DEBRIDEMENT EXTREMITY;  Surgeon: WiRoseanne KaufmanMD;  [facility] Service: [facility]ics;  Laterality: Right;  Irrigation and Debridement and Repair Right Thumb Nerve Laceration and Assoiated Structures    KNEE DEBRIDEMENT     right   LAPAROTOMY     LEFT HEART CATHETERIZATION WITH CORONARY ANGIOGRAM N/A 01/16/2013   Procedure: LEFT HEART CATHETERIZATION WITH CORONARY ANGIOGRAM;  Surgeon: Jolaine Artist, MD;  Location: [facility] CATH LAB;  Service: Cardiovascular;  Laterality: N/A;   POLYPECTOMY  08/11/2021   Procedure: POLYPECTOMY;  Surgeon: Rogene Houston, MD;  [facility];  Service: Endoscopy;;   SACROILIAC JOINT FUSION Right 05/02/2019   Procedure: SACROILIAC JOINT FUSION;  Surgeon: Melina Schools, MD;  Location: [place];  Service: Orthopedics;  Laterality: Right;  SACROILIAC JOINT FUSION   STAPEDECTOMY     right   TONSILLECTOMY AND ADENOIDECTOMY     TRIGGER FINGER RELEASE  08/19/2011   Procedure: RELEASE TRIGGER FINGER/A-1 PULLEY;  Surgeon: Willa Frater  III;  Location: [place];  Service: Orthopedics;  Laterality: Right;  right middle finger a-1 pulley release with tenosynovectomy   TROCHANTERIC BURSA EXCISION Right 02/04/2016   TUBAL LIGATION     TYMPANOPLASTY      There were no vitals filed for this visit.   Subjective Assessment - 09/09/21 0848     Subjective Pt denies any falls or changes since last visit.    Pertinent History PMH:  CAD, depression, anxiety, chronic migraine, fibromyalgia, IBS, MI, SI joint dysfunction, scoliosis, sciatica R side, L hip trochanteric bursectomy, SI joint fusion    Patient Stated Goals Pt's goals for therapy are to walk and not be dizzy; to walk and not be dizzy.    Currently in Pain? Yes    Pain Score 5     Pain Location Generalized    Pain Orientation Right;Left    Pain Descriptors / Indicators Aching    Pain Type Chronic pain    Pain Onset More than a month ago    Pain Frequency Intermittent    Aggravating Factors  rainy, cold    Pain Relieving Factors medications                PT Education - 09/09/21 0957     Education Details Progress toward goals and plan for d/c at this visit per poc, updated HEP and emailed to pt, fall prevention and home safety, HEP to be performed to pt tolerance and not to do all of HEP on a single day    Person(s) Educated Patient    Methods Explanation;Demonstration;Handout    Comprehension Verbalized understanding;Returned demonstration              PT Short Term Goals - 09/09/21 0958       PT SHORT TERM GOAL #1   Title Pt will be independent with HEP for improved strength, balance, transfers, and gait.  TARGET 09/10/2021    Baseline Pt independent with HEP 09/09/21    Time 4    Period Weeks    Status Achieved      PT SHORT TERM GOAL #2   Title Pt will improve 5x sit<>stand to less than or equal to 13 sec to demonstrate improved functional strength and transfer efficiency.    Baseline 15.97 sec at eval; 11.56 sec 09/07/2021     Time 4    Period Weeks    Status Achieved      PT SHORT TERM GOAL #3   Title Pt will improve FGA score to at least 18/30 to decrease fall risk.    Baseline 14/30;  FGA score 25/30 09/07/21    Time 4    Period Weeks    Status Achieved      PT SHORT TERM GOAL #4   Title Pt will verbalize understanding of fall prevention in home environment.    Time 4    Period Weeks    Status Achieved               PT Long Term Goals - 09/09/21 0959       PT LONG TERM GOAL #1   Title Pt will be independent with progression of HEP for improved strength, balance, transfers, and gait.  TARGET 09/24/2021    Time 6    Period Weeks    Status Achieved      PT LONG TERM GOAL #2   Title Pt will improve 5x sit<>stand to less than or equal to 11.5 secto demonstrate improved functional strength and transfer efficiency.    Baseline 11.56 sec 09/07/21    Time 6    Period Weeks    Status Achieved      PT LONG TERM GOAL #3   Title Pt will improve FGA score to at least 22/30 to decrease fall risk.    Baseline FGA score 25/30 09/07/21    Time 6    Period Weeks    Status Achieved      PT LONG TERM GOAL #4   Title Pt will report 3-4/10 or less dizziness/unsteadiness with head motions to improve overall balance.    Baseline 6-8/10 reports during eval; rates unsteadiness as 2-3/10    Time 6    Period Weeks    Status Achieved      PT LONG TERM GOAL #5   Title Pt will perform condition 4 on MCTSIB (eyes closed, standing on foam) for 30 seconds with minimal to no sway to demo improved vestibular system use for balance.    Baseline 30 sec with increased posterior sway; 30 sec with minimal sway 09/07/21    Time 6    Period Weeks    Status Achieved                   Plan - 09/09/21 0953     Clinical Impression Statement Pt has met all STG's and LTG's and per poc will be d/c from PT per Mady Haagensen, PT.  Pt has made excellent progress and decreased dizziness and increased overall mobility  and balance.    Personal Factors and Comorbidities Comorbidity 3+    Comorbidities PMH:  CAD, depression, anxiety, chronic migraine, fibromyalgia, IBS, MI, SI joint dysfunction, scoliosis, sciatica R side, L hip trochanteric bursectomy, SI joint fusion    Examination-Activity Limitations Locomotion Level;Transfers;Stand;Caring for Others    Examination-Participation Restrictions Community Activity;Other;Occupation   [place] potential return to work   Stability/Clinical Decision Making Evolving/Moderate complexity    Rehab Potential Good    PT Frequency 2x / week    PT Duration 6 weeks   including eval week   PT Treatment/Interventions ADLs/Self Care Home Management;Gait training;Stair training;Functional mobility training;Therapeutic activities;Therapeutic exercise;Balance training;Neuromuscular re-education;Patient/family education;Vestibular    PT Next Visit Plan Discharge from PT.    PT Home Exercise Plan Access Code: [email or web address]  URL: [email or web address]  Date: 09/09/2021  Prepared by: Nita Sells    Exercises  Standing Romberg to 1/2 Tandem Stance - 1 x daily - 5 x weekly - 1 sets - 13 reps - 15 sec hold  Romberg Stance - 1 x daily -  5 x weekly - 1 sets - 3 reps - 15 sec hold  Standing Gaze Stabilization with Head Rotation - 2-3 x daily - 7 x weekly - 1 sets - 3 reps  Standing on Foam Pad - 1 x daily - 5 x weekly - 1 sets - 10 reps  Supine Bridge - 1 x daily - 5 x weekly - 1 sets - 10 reps  Bridge with Hip Abduction and Resistance - 1 x daily - 5 x weekly - 1 sets - 10 reps  Clamshell with Resistance - 1 x daily - 5 x weekly - 1 sets - 10 reps  Supine March with Resistance Band - 1 x daily - 5 x weekly - 1 sets - 10 reps  Step Up - 1 x daily - 5 x weekly - 1 sets - 10 reps  Lateral Step Down - 1 x daily - 5 x weekly - 1 sets - 10 reps  Forward Step Down Touch with Toe - 1 x daily - 5 x weekly - 1 sets - 10 reps  Quadruped Alternating Leg Extensions - 1 x daily - 5 x  weekly - 1 sets - 10 reps  Quadruped Alternating Arm Lift - 1 x daily - 5 x weekly - 1 sets - 10 reps  Instructed pt to rotate exercises to her tolerance and not perform all exercises at one time.    Consulted and Agree with Plan of Care Patient             Patient will benefit from skilled therapeutic intervention in order to improve the following deficits and impairments:  Abnormal gait, Difficulty walking, Decreased balance, Decreased mobility, Decreased strength  Visit Diagnosis: Unsteadiness on feet  Other abnormalities of gait and mobility  Muscle weakness (generalized)     Problem List Patient Active Problem List   Diagnosis Date Noted   Gait abnormality 08/03/2021   Irritable bowel syndrome (IBS) 07/13/2021   Sciatica of right side 04/21/2020   Microvascular angina ([place]) 01/01/2019   Unstable angina ([facility])    Common migraine with intractable migraine 06/16/2017   Tremor 11/16/2016   Trochanteric bursitis of left hip 02/04/2016   Trochanteric bursitis of both hips 10/31/2015   Chest pain at rest 06/17/2015   Inferior myocardial infarction ([place]) 06/17/2015   Sprain of interphalangeal joint of left little finger 02/05/2014   Pain in joint, hand 02/05/2014   Midsternal chest pain 06/04/2013   CAD (coronary artery disease)    Fibromyalgia    GERD (gastroesophageal reflux disease)    Migraine headache    Acute non-STEMI < 8 weeks prior, subsequent admission after initial 01/16/2013   Radial nerve laceration 03/27/2012   Lack of coordination 03/27/2012   Muscle weakness (generalized) 03/27/2012    Lesli Albee, PTA 09/09/2021, 10:02 AM  [facility] [address], [place], [name], [address] Phone: [phone]   Fax:  [phone]  Name: Stacey Lawrence MRN: [id number] Date of Birth: 19-Apr-1951

## 2021-09-09 NOTE — Therapy (Addendum)
[facility] [address] [place], [name], [address] Phone: [phone]   Fax:  [phone]  Physical Therapy Treatment/Discharge Summary  Patient Details  Name: Stacey Lawrence MRN: [id number] Date of Birth: May 09, 1951 Referring Provider (PT): Krista Blue   PHYSICAL THERAPY DISCHARGE SUMMARY  Visits from Start of Care: 7  Current functional level related to goals / functional outcomes:  Pt has met all STGs and all LTGs.   PT Short Term Goals - 09/09/21 0958       PT SHORT TERM GOAL #1   Title Pt will be independent with HEP for improved strength, balance, transfers, and gait.  TARGET 09/10/2021    Baseline Pt independent with HEP 09/09/21    Time 4    Period Weeks    Status Achieved      PT SHORT TERM GOAL #2   Title Pt will improve 5x sit<>stand to less than or equal to 13 sec to demonstrate improved functional strength and transfer efficiency.    Baseline 15.97 sec at eval; 11.56 sec 09/07/2021    Time 4    Period Weeks    Status Achieved      PT SHORT TERM GOAL #3   Title Pt will improve FGA score to at least 18/30 to decrease fall risk.    Baseline 14/30; FGA score 25/30 09/07/21    Time 4    Period Weeks    Status Achieved      PT SHORT TERM GOAL #4   Title Pt will verbalize understanding of fall prevention in home environment.    Time 4    Period Weeks    Status Achieved              PT Long Term Goals - 09/09/21 0959       PT LONG TERM GOAL #1   Title Pt will be independent with progression of HEP for improved strength, balance, transfers, and gait.  TARGET 09/24/2021    Time 6    Period Weeks    Status Achieved      PT LONG TERM GOAL #2   Title Pt will improve 5x sit<>stand to less than or equal to 11.5 secto demonstrate improved functional strength and transfer efficiency.    Baseline 11.56 sec 09/07/21    Time 6    Period Weeks    Status Achieved      PT LONG TERM GOAL #3   Title Pt will  improve FGA score to at least 22/30 to decrease fall risk.    Baseline FGA score 25/30 09/07/21    Time 6    Period Weeks    Status Achieved      PT LONG TERM GOAL #4   Title Pt will report 3-4/10 or less dizziness/unsteadiness with head motions to improve overall balance.    Baseline 6-8/10 reports during eval; rates unsteadiness as 2-3/10    Time 6    Period Weeks    Status Achieved      PT LONG TERM GOAL #5   Title Pt will perform condition 4 on MCTSIB (eyes closed, standing on foam) for 30 seconds with minimal to no sway to demo improved vestibular system use for balance.    Baseline 30 sec with increased posterior sway; 30 sec with minimal sway 09/07/21    Time 6    Period Weeks    Status Achieved  Remaining deficits: High level balance, compliant surfaces (improving)   Education / Equipment: Pt educated in HEP, fall prevention, verbalize/return demo understanding.   Patient agrees to discharge. Patient goals were met. Patient is being discharged due to meeting the stated rehab goals.  Mady Haagensen, PT 09/09/21 11:12 AM Phone: [phone] Fax: [phone]   Encounter Date: 09/09/2021   PT End of Session - 09/09/21 0959     Visit Number 7    Number of Visits 12    Date for PT Re-Evaluation 09/24/21    Authorization Type [employer]    Progress Note Due on Visit 10    PT Start Time 0845    PT Stop Time 0930    PT Time Calculation (min) 45 min    Activity Tolerance Patient tolerated treatment well    Behavior During Therapy [facility] for tasks assessed/performed             Past Medical History:  Diagnosis Date   Anemia    hx   Anxiety    CAD (coronary artery disease)    a. 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 05/2013 Lexi CL: EF 77%, old small inferolat scar, no ischemia.   Common migraine with intractable migraine 06/16/2017   Depression    Fibromyalgia    GERD (gastroesophageal reflux disease)    hasn't started taking any meds   History of blood transfusion    224yrago   History of kidney stones    Hx of colonic polyps    IBS (irritable bowel syndrome)    constipation   Joint pain    Joint swelling    Migraine    "q other week" (02/04/2016)   [place]   Numbness    right leg   Polycythemia    PONV (postoperative nausea and vomiting)    Raynaud's disease    Sciatica of right side 04/21/2020   Scoliosis    Shortness of breath dyspnea    with exertion   SI (sacroiliac) joint dysfunction    right   Wears glasses     Past Surgical History:  Procedure Laterality Date   ABDOMINAL HYSTERECTOMY     partial   APPENDECTOMY     with explor. lap.   CARDIAC CATHETERIZATION     CHOLECYSTECTOMY  10/28/2002   lap.   COLONOSCOPY     COLONOSCOPY WITH PROPOFOL N/A 08/11/2021   Procedure: COLONOSCOPY WITH PROPOFOL;  Surgeon: ReRogene HoustonMD;  [facility];  Service: Endoscopy;  Laterality: N/A;  8:30   DEBRIDEMENT TENNIS ELBOW     DIAGNOSTIC LAPAROSCOPY     DILATION AND CURETTAGE OF UTERUS     ELBOW SURGERY     golfer's elbow-bilateral   ESOPHAGOGASTRODUODENOSCOPY  09/10/2012   Procedure: ESOPHAGOGASTRODUODENOSCOPY (EGD);  Surgeon: NaRogene HoustonMD;  [facility];  Service: Endoscopy;  Laterality: N/A;  730   EXCISION/RELEASE BURSA HIP Left 02/04/2016   Procedure: LEFT HIP TROCHANTERIC BURSECTOMY;  Surgeon: ChMcarthur RossettiMD;  [facility] Service: Orthopedics;  Laterality: Left;   I & D EXTREMITY  02/25/2012   Procedure: IRRIGATION AND DEBRIDEMENT EXTREMITY;  Surgeon: WiRoseanne KaufmanMD;  Location: [facility];  Service: Orthopedics;  Laterality: Right;  Irrigation and Debridement and Repair Right Thumb  Nerve Laceration and Assoiated Structures    KNEE DEBRIDEMENT     right   LAPAROTOMY     LEFT HEART CATHETERIZATION WITH CORONARY ANGIOGRAM N/A 01/16/2013   Procedure: LEFT HEART CATHETERIZATION WITH CORONARY ANGIOGRAM;  Surgeon: Jolaine Artist, MD;  Location: [facility] CATH LAB;  Service: Cardiovascular;  Laterality: N/A;   POLYPECTOMY  08/11/2021   Procedure: POLYPECTOMY;  Surgeon: Rogene Houston, MD;  [facility];  Service: Endoscopy;;   SACROILIAC JOINT FUSION Right 05/02/2019   Procedure: SACROILIAC JOINT FUSION;  Surgeon: Melina Schools, MD;  Location: [facility];  Service: Orthopedics;  Laterality: Right;  SACROILIAC JOINT FUSION   STAPEDECTOMY     right   TONSILLECTOMY AND ADENOIDECTOMY     TRIGGER FINGER RELEASE  08/19/2011   Procedure: RELEASE TRIGGER FINGER/A-1 PULLEY;  Surgeon: Willa Frater III;  Location: [facility];  Service: Orthopedics;  Laterality: Right;  right middle finger a-1 pulley release with tenosynovectomy   TROCHANTERIC BURSA EXCISION Right 02/04/2016   TUBAL LIGATION     TYMPANOPLASTY      There were no vitals filed for this visit.   Subjective Assessment - 09/09/21 0848     Subjective Pt denies any falls or changes since last visit.    Pertinent History PMH:  CAD, depression, anxiety, chronic migraine, fibromyalgia, IBS, MI, SI joint dysfunction, scoliosis, sciatica R side, L hip trochanteric bursectomy, SI joint fusion    Patient Stated Goals Pt's goals for therapy are to walk and not be dizzy; to walk and not be dizzy.    Currently in Pain? Yes    Pain Score 5     Pain Location Generalized    Pain Orientation Right;Left    Pain Descriptors / Indicators Aching    Pain Type Chronic pain    Pain Onset More than a month ago    Pain Frequency Intermittent    Aggravating Factors  rainy, cold    Pain Relieving Factors medications               [facility] Adult PT Treatment/Exercise - 09/09/21 0001       High Level Balance   High  Level Balance Comments Standing on foam with eyes open and eyes closed with static standing x 20 sec, head turns/nods, wide and narrow bos      Exercises   Exercises Other Exercises    Other Exercises  See HEP for all exercises performed and reviewed during session as part of her treatment today.  Pt able to return demo of home HEP.  Provided with emailed copy of HEP along with red and blue resistance bands to progress exercises.               PT Education - 09/09/21 0957     Education Details Progress toward goals and plan for d/c at this visit per poc, updated HEP and emailed to pt, fall prevention and home safety, HEP to be performed to pt tolerance and not to do all of HEP on a single day    Person(s) Educated Patient    Methods Explanation;Demonstration;Handout    Comprehension Verbalized understanding;Returned demonstration              PT Short Term Goals - 09/09/21 0958       PT SHORT TERM GOAL #1   Title Pt will be independent with HEP  for improved strength, balance, transfers, and gait.  TARGET 09/10/2021    Baseline Pt independent with HEP 09/09/21    Time 4    Period Weeks    Status Achieved      PT SHORT TERM GOAL #2   Title Pt will improve 5x sit<>stand to less than or equal to 13 sec to demonstrate improved functional strength and transfer efficiency.    Baseline 15.97 sec at eval; 11.56 sec 09/07/2021    Time 4    Period Weeks    Status Achieved      PT SHORT TERM GOAL #3   Title Pt will improve FGA score to at least 18/30 to decrease fall risk.    Baseline 14/30; FGA score 25/30 09/07/21    Time 4    Period Weeks    Status Achieved      PT SHORT TERM GOAL #4   Title Pt will verbalize understanding of fall prevention in home environment.    Time 4    Period Weeks    Status Achieved               PT Long Term Goals - 09/09/21 0959       PT LONG TERM GOAL #1   Title Pt will be independent with progression of HEP for improved strength,  balance, transfers, and gait.  TARGET 09/24/2021    Time 6    Period Weeks    Status Achieved      PT LONG TERM GOAL #2   Title Pt will improve 5x sit<>stand to less than or equal to 11.5 secto demonstrate improved functional strength and transfer efficiency.    Baseline 11.56 sec 09/07/21    Time 6    Period Weeks    Status Achieved      PT LONG TERM GOAL #3   Title Pt will improve FGA score to at least 22/30 to decrease fall risk.    Baseline FGA score 25/30 09/07/21    Time 6    Period Weeks    Status Achieved      PT LONG TERM GOAL #4   Title Pt will report 3-4/10 or less dizziness/unsteadiness with head motions to improve overall balance.    Baseline 6-8/10 reports during eval; rates unsteadiness as 2-3/10    Time 6    Period Weeks    Status Achieved      PT LONG TERM GOAL #5   Title Pt will perform condition 4 on MCTSIB (eyes closed, standing on foam) for 30 seconds with minimal to no sway to demo improved vestibular system use for balance.    Baseline 30 sec with increased posterior sway; 30 sec with minimal sway 09/07/21    Time 6    Period Weeks    Status Achieved                   Plan - 09/09/21 0953     Clinical Impression Statement Pt has met all STG's and LTG's and per poc will be d/c from PT per Mady Haagensen, PT.  Pt has made excellent progress and decreased dizziness and increased overall mobility and balance.    Personal Factors and Comorbidities Comorbidity 3+    Comorbidities PMH:  CAD, depression, anxiety, chronic migraine, fibromyalgia, IBS, MI, SI joint dysfunction, scoliosis, sciatica R side, L hip trochanteric bursectomy, SI joint fusion    Examination-Activity Limitations Locomotion Level;Transfers;Stand;Caring for Others    Examination-Participation Restrictions Community Activity;Other;Occupation   [place] potential return to work   Stability/Clinical Decision Making Evolving/Moderate complexity    Rehab Potential Good    PT  Frequency 2x / week    PT Duration 6 weeks   including eval week   PT Treatment/Interventions ADLs/Self Care Home Management;Gait training;Stair training;Functional mobility training;Therapeutic activities;Therapeutic exercise;Balance training;Neuromuscular re-education;Patient/family education;Vestibular    PT Next Visit Plan Discharge from PT.    PT Home Exercise Plan Access Code: [email or web address]  URL: [email or web address]  Date: 09/09/2021  Prepared by: Nita Sells    Exercises  Standing Romberg to 1/2 Tandem Stance - 1 x daily - 5 x weekly - 1 sets - 13 reps - 15 sec hold  Romberg Stance - 1 x daily - 5 x weekly - 1 sets - 3 reps - 15 sec hold  Standing Gaze Stabilization with Head Rotation - 2-3 x daily - 7 x weekly - 1 sets - 3 reps  Standing on Foam Pad - 1 x daily - 5 x weekly - 1 sets - 10 reps  Supine Bridge - 1 x daily - 5 x weekly - 1 sets - 10 reps  Bridge with Hip Abduction and Resistance - 1 x daily - 5 x weekly - 1 sets - 10 reps  Clamshell with Resistance - 1 x daily - 5 x weekly - 1 sets - 10 reps  Supine March with Resistance Band - 1 x daily - 5 x weekly - 1 sets - 10 reps  Step Up - 1 x daily - 5 x weekly - 1 sets - 10 reps  Lateral Step Down - 1 x daily - 5 x weekly - 1 sets - 10 reps  Forward Step Down Touch with Toe - 1 x daily - 5 x weekly - 1 sets - 10 reps  Quadruped Alternating Leg Extensions - 1 x daily - 5 x weekly - 1 sets - 10 reps  Quadruped Alternating Arm Lift - 1 x daily - 5 x weekly - 1 sets - 10 reps  Instructed pt to rotate exercises to her tolerance and not perform all exercises at one time.    Consulted and Agree with Plan of Care Patient             Patient will benefit from skilled therapeutic intervention in order to improve the following deficits and impairments:  Abnormal gait, Difficulty walking, Decreased balance, Decreased mobility, Decreased strength  Visit Diagnosis: Unsteadiness on feet  Other abnormalities of gait and  mobility  Muscle weakness (generalized)     Problem List Patient Active Problem List   Diagnosis Date Noted   Gait abnormality 08/03/2021   Irritable bowel syndrome (IBS) 07/13/2021   Sciatica of right side 04/21/2020   Microvascular angina ([place]) 01/01/2019   Unstable angina ([facility])    Common migraine with intractable migraine 06/16/2017   Tremor 11/16/2016   Trochanteric bursitis of left hip 02/04/2016   Trochanteric bursitis of both hips 10/31/2015   Chest pain at rest 06/17/2015   Inferior myocardial infarction ([place]) 06/17/2015   Sprain of interphalangeal joint of left little finger 02/05/2014   Pain in joint, hand 02/05/2014   Midsternal chest pain 06/04/2013   CAD (coronary artery disease)    Fibromyalgia    GERD (gastroesophageal reflux disease)    Migraine headache    Acute non-STEMI < 8 weeks prior, subsequent admission after initial 01/16/2013   Radial nerve laceration 03/27/2012   Lack of coordination 03/27/2012   Muscle weakness (generalized) 03/27/2012  Narda Bonds, [place] 09/09/21 10:07 AM Phone: [phone] Fax: [facility] [address] [place], [name], [address] Phone: [phone]   Fax:  [phone]  Name: Stacey Lawrence MRN: [id number] Date of Birth: [birth year]

## 2021-09-09 NOTE — Patient Instructions (Addendum)
Fall Prevention in the Home, Adult Falls can cause injuries and affect people of all ages. There are many simple things that you can do to make your home safe and to help prevent falls. Ask for help when making these changes, if needed. What actions can I take to prevent falls? General instructions Use good lighting in all rooms. Replace any light bulbs that burn out, turn on lights if it is dark, and use night-lights. Place frequently used items in easy-to-reach places. Lower the shelves around your home if necessary. Set up furniture so that there are clear paths around it. Avoid moving your furniture around. Remove throw rugs and other tripping hazards from the floor. Avoid walking on wet floors. Fix any uneven floor surfaces. Add color or contrast paint or tape to grab bars and handrails in your home. Place contrasting color strips on the first and last steps of staircases. When you use a stepladder, make sure that it is completely opened and that the sides and supports are firmly locked. Have someone hold the ladder while you are using it. Do not climb a closed stepladder. Know where your pets are when moving through your home. What can I do in the bathroom?   Keep the floor dry. Immediately clean up any water that is on the floor. Remove soap buildup in the tub or shower regularly. Use nonskid mats or decals on the floor of the tub or shower. Attach bath mats securely with double-sided, nonslip rug tape. If you need to sit down while you are in the shower, use a plastic, nonslip stool. Install grab bars by the toilet and in the tub and shower. Do not use towel bars as grab bars. What can I do in the bedroom? Make sure that a bedside light is easy to reach. Do not use oversized bedding that reaches the floor. Have a firm chair that has side arms to use for getting dressed. What can I do in the kitchen? Clean up any spills right away. If you need to reach for something above you, use a  sturdy step stool that has a grab bar. Keep electrical cables out of the way. Do not use floor polish or wax that makes floors slippery. If you must use wax, make sure that it is non-skid floor wax. What can I do with my stairs? Do not leave any items on the stairs. Make sure that you have a light switch at the top and the bottom of the stairs. Have them installed if you do not have them. Make sure that there are handrails on both sides of the stairs. Fix handrails that are broken or loose. Make sure that handrails are as long as the staircases. Install non-slip stair treads on all stairs in your home. Avoid having throw rugs at the top or bottom of stairs, or secure the rugs with carpet tape to prevent them from moving. Choose a carpet design that does not hide the edge of steps on the stairs. Check any carpeting to make sure that it is firmly attached to the stairs. Fix any carpet that is loose or worn. What can I do on the outside of my home? Use bright outdoor lighting. Regularly repair the edges of walkways and driveways and fix any cracks. Remove high doorway thresholds. Trim any shrubbery on the main path into your home. Regularly check that handrails are securely fastened and in good repair. Both sides of all steps should have handrails. Install guardrails along the edges  of any raised decks or porches. Clear walkways of debris and clutter, including tools and rocks. Have leaves, snow, and ice cleared regularly. Use sand or salt on walkways during winter months. In the garage, clean up any spills right away, including grease or oil spills. What other actions can I take? Wear closed-toe shoes that fit well and support your feet. Wear shoes that have rubber soles or low heels. Use mobility aids as needed, such as canes, walkers, scooters, and crutches. Review your medicines with your health care provider. Some medicines can cause dizziness or changes in blood pressure, which increase  your risk of falling. Talk with your health care provider about other ways that you can decrease your risk of falls. This may include working with a physical therapist or trainer to improve your strength, balance, and endurance. Where to find more information Centers for Disease Control and Prevention, STEAD[employer]: http://www.wolf.info/ National Institute on Aging: http://kim-miller.com/ Contact a health care provider if: You are afraid of falling at home. You feel weak, drowsy, or dizzy at home. You fall at home. Summary There are many simple things that you can do to make your home safe and to help prevent falls. Ways to make your home safe include removing tripping hazards and installing grab bars in the bathroom. Ask for help when making these changes in your home. This information is not intended to replace advice given to you by your health care provider. Make sure you discuss any questions you have with your health care provider. Document Revised: 04/08/2020 Document Reviewed: 04/08/2020 Elsevier Patient Education  2022 [employer].  It is important to avoid accidents which may result in broken bones.  Here are a few ideas on how to make your home safer so you will be less likely to trip or fall.  Use nonskid mats or non slip strips in your shower or tub, on your bathroom floor and around sinks.  If you know that you have spilled water, wipe it up! In the bathroom, it is important to have properly installed grab bars on the walls or on the edge of the tub.  Towel racks are NOT strong enough for you to hold onto or to pull on for support. Stairs and hallways should have enough light.  Add lamps or night lights if you need ore light. It is good to have handrails on both sides of the stairs if possible.  Always fix broken handrails right away. It is important to see the edges of steps.  Paint the edges of outdoor steps white so you can see them better.  Put colored tape on the edge of inside  steps. Throw-rugs are dangerous because they can slide.  Removing the rugs is the best idea, but if they must stay, add adhesive carpet tape to prevent slipping. Do not keep things on stairs or in the halls.  Remove small furniture that blocks the halls as it may cause you to trip.  Keep telephone and electrical cords out of the way where you walk. Always were sturdy, rubber-soled shoes for good support.  Never wear just socks, especially on the stairs.  Socks may cause you to slip or fall.  Do not wear full-length housecoats as you can easily trip on the bottom.  Place the things you use the most on the shelves that are the easiest to reach.  If you use a stepstool, make sure it is in good condition.  If you feel unsteady, DO NOT climb, ask for  help. If a health professional advises you to use a cane or walker, do not be ashamed.  These items can keep you from falling and breaking your bones.  Access Code: [email or web address] URL: [email or web address] Date: 09/09/2021 Prepared by: Nita Sells  Exercises Standing Romberg to 1/2 Tandem Stance - 1 x daily - 5 x weekly - 1 sets - 13 reps - 15 sec hold Romberg Stance - 1 x daily - 5 x weekly - 1 sets - 3 reps - 15 sec hold Standing Gaze Stabilization with Head Rotation - 2-3 x daily - 7 x weekly - 1 sets - 3 reps Standing on Foam Pad - 1 x daily - 5 x weekly - 1 sets - 10 reps Supine Bridge - 1 x daily - 5 x weekly - 1 sets - 10 reps Bridge with Hip Abduction and Resistance - 1 x daily - 5 x weekly - 1 sets - 10 reps Clamshell with Resistance - 1 x daily - 5 x weekly - 1 sets - 10 reps Supine March with Resistance Band - 1 x daily - 5 x weekly - 1 sets - 10 reps Step Up - 1 x daily - 5 x weekly - 1 sets - 10 reps Lateral Step Down - 1 x daily - 5 x weekly - 1 sets - 10 reps Forward Step Down Touch with Toe - 1 x daily - 5 x weekly - 1 sets - 10 reps Quadruped Alternating Leg Extensions - 1 x daily - 5 x weekly - 1 sets - 10  reps Quadruped Alternating Arm Lift - 1 x daily - 5 x weekly - 1 sets - 10 reps

## 2021-09-28 ENCOUNTER — Other Ambulatory Visit (HOSPITAL_COMMUNITY): Payer: Self-pay

## 2021-09-28 DIAGNOSIS — B353 Tinea pedis: Secondary | ICD-10-CM | POA: Diagnosis not present

## 2021-09-28 MED ORDER — KETOCONAZOLE 2 % EX CREA
TOPICAL_CREAM | CUTANEOUS | 0 refills | Status: DC
  Filled 2021-09-28: qty 30, 30d supply, fill #0

## 2021-09-28 MED ORDER — KETOCONAZOLE 2 % EX CREA: TOPICAL_CREAM | CUTANEOUS | 0 refills | Status: DC

## 2021-10-05 ENCOUNTER — Ambulatory Visit (INDEPENDENT_AMBULATORY_CARE_PROVIDER_SITE_OTHER): Payer: Medicare HMO | Admitting: Internal Medicine
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
## 2021-10-19 ENCOUNTER — Telehealth: Payer: Medicare HMO | Admitting: Psychiatry

## 2021-10-19 ENCOUNTER — Other Ambulatory Visit: Payer: Self-pay

## 2021-10-19 ENCOUNTER — Telehealth: Payer: Self-pay | Admitting: Psychiatry

## 2021-10-19 NOTE — Telephone Encounter (Signed)
Called the patient twice for appointment scheduled today. The patient did not answer the phone. Voice message was full.

## 2021-10-29 DIAGNOSIS — H903 Sensorineural hearing loss, bilateral: Secondary | ICD-10-CM | POA: Diagnosis not present

## 2021-11-25 ENCOUNTER — Ambulatory Visit: Payer: Medicare PPO | Admitting: Cardiology

## 2021-11-25 ENCOUNTER — Encounter: Payer: Self-pay | Admitting: Cardiology

## 2021-11-25 ENCOUNTER — Other Ambulatory Visit: Payer: Self-pay

## 2021-11-25 VITALS — BP 112/74 | HR 79 | Ht 67.0 in | Wt 141.8 lb

## 2021-11-25 DIAGNOSIS — E785 Hyperlipidemia, unspecified: Secondary | ICD-10-CM | POA: Diagnosis not present

## 2021-11-25 DIAGNOSIS — I2542 Coronary artery dissection: Secondary | ICD-10-CM

## 2021-11-25 DIAGNOSIS — Q245 Malformation of coronary vessels: Secondary | ICD-10-CM

## 2021-11-25 DIAGNOSIS — R079 Chest pain, unspecified: Secondary | ICD-10-CM | POA: Diagnosis not present

## 2021-11-25 NOTE — Progress Notes (Signed)
Cardiology Office Note:    Date:  11/25/2021   ID:  Vasti, Yagi 1951/05/09, MRN [id number]  PCP:  Celene Squibb, MD  Cardiologist:  Donato Heinz, MD  Electrophysiologist:  None   Referring MD: Celene Squibb, MD   No chief complaint on file.   History of Present Illness:    Stacey Lawrence is a 71 y.o. female with a hx of SCAD, anomalous left main coronary artery, fibromyalgia, Raynaud's, GERD who presents for follow-up.  She previously followed with Dr. Bronson Ing, last seen in 12/05/2019.  She had an MI in 2014 secondary to SCAD of a small OM 2 branch, treated medically.  Also noted to have mid LAD lesion likely related to myocardial bridging.  Coronary CTA 01/02/2019 showed calcium score 0, anomalous left main coronary arising from right coronary artery with retroaortic course.  She has been on Cardizem for suspected microvascular disease.  She reports that she walks 1 to 2 miles per day.  Recently had episode of heaviness in chest and felt like heart was racing with exertion.  Reports occasional shortness of breath and having lightheadedness.  Denies any syncope.  Denies any lower extremity edema.  No smoking history.  No family history of heart disease in her immediate family.   Past Medical History:  Diagnosis Date   Anemia    hx   Anxiety    CAD (coronary artery disease)    a. 12/2012 NSTEMI/Cath: LM anomalous, arising in R cusp ant to RCA, otw nl, LAD 53mwith bridge, RI nl, LCX nl, OM2 small, subtl occl w/ thrombus distally - felt to be spont dissection, too small for PCI->Med Rx, RCA large, dom, nl, PDA/PD nl, EF 55% w/ focal HK in mid-dist antlat wall;  b. 05/2013 Lexi CL: EF 77%, old small inferolat scar, no ischemia.   Common migraine with intractable migraine 06/16/2017   Depression    Fibromyalgia    GERD (gastroesophageal reflux disease)    hasn't started taking any meds   History of blood transfusion    260yrago   History of kidney stones    Hx of  colonic polyps    IBS (irritable bowel syndrome)    constipation   Joint pain    Joint swelling    Migraine    "q other week" (02/04/2016)   [place]ocardial infarction ([id number]   Numbness    right leg   Polycythemia    PONV (postoperative nausea and vomiting)    Raynaud's disease    Sciatica of right side 04/21/2020   Scoliosis    Shortness of breath dyspnea    with exertion   SI (sacroiliac) joint dysfunction    right   Wears glasses     Past Surgical History:  Procedure Laterality Date   ABDOMINAL HYSTERECTOMY     partial   APPENDECTOMY     with explor. lap.   CARDIAC CATHETERIZATION     CHOLECYSTECTOMY  10/28/2002   lap.   COLONOSCOPY     COLONOSCOPY WITH PROPOFOL N/A 08/11/2021   Procedure: COLONOSCOPY WITH PROPOFOL;  Surgeon: ReRogene HoustonMD;  [facility];  Service: Endoscopy;  Laterality: N/A;  8:30   DEBRIDEMENT TENNIS ELBOW     DIAGNOSTIC LAPAROSCOPY     DILATION AND CURETTAGE OF UTERUS     ELBOW SURGERY     golfer's elbow-bilateral   ESOPHAGOGASTRODUODENOSCOPY  09/10/2012   Procedure: ESOPHAGOGASTRODUODENOSCOPY (EGD);  Surgeon: NaRogene HoustonMD;  [facility];  Service: Endoscopy;  Laterality: N/A;  730   EXCISION/RELEASE BURSA HIP Left 02/04/2016   Procedure: LEFT HIP TROCHANTERIC BURSECTOMY;  Surgeon: Mcarthur Rossetti, MD;  Location: [facility];  Service: Orthopedics;  Laterality: Left;   I & D EXTREMITY  02/25/2012   Procedure: IRRIGATION AND DEBRIDEMENT EXTREMITY;  Surgeon: Roseanne Kaufman, MD;  Location: [place] [facility];  Service: Orthopedics;  Laterality: Right;  Irrigation and Debridement and Repair Right Thumb Nerve Laceration and Assoiated Structures    KNEE DEBRIDEMENT     right   LAPAROTOMY     LEFT HEART CATHETERIZATION WITH CORONARY ANGIOGRAM N/A 01/16/2013   Procedure: LEFT HEART CATHETERIZATION WITH CORONARY ANGIOGRAM;  Surgeon: Jolaine Artist, MD;  Location: [facility] CATH [facility];  Service: Cardiovascular;  Laterality: N/A;   POLYPECTOMY   08/11/2021   Procedure: POLYPECTOMY;  Surgeon: Rogene Houston, MD;  [facility];  Service: Endoscopy;;   SACROILIAC JOINT FUSION Right 05/02/2019   Procedure: SACROILIAC JOINT FUSION;  Surgeon: Melina Schools, MD;  Location: [place];  Service: Orthopedics;  Laterality: Right;  SACROILIAC JOINT FUSION   STAPEDECTOMY     right   TONSILLECTOMY AND ADENOIDECTOMY     TRIGGER FINGER RELEASE  08/19/2011   Procedure: RELEASE TRIGGER FINGER/A-1 PULLEY;  Surgeon: Willa Frater III;  Location: [place];  Service: Orthopedics;  Laterality: Right;  right middle finger a-1 pulley release with tenosynovectomy   TROCHANTERIC BURSA EXCISION Right 02/04/2016   TUBAL LIGATION     TYMPANOPLASTY      Current Medications: Current Meds  Medication Sig   BIOTIN PO biotin   Calcium Citrate-Vitamin D (CALCITRATE/VITAMIN D PO) Take 1 tablet by mouth daily.   Cholecalciferol (VITAMIN D3) 50 MCG (2000 UT) TABS Take by mouth.   COLLAGEN PO Take by mouth.   diltiazem (CARDIZEM CD) 120 MG 24 hr capsule TAKE 1 CAPSULE BY MOUTH ONCE A DAY   DULoxetine (CYMBALTA) 60 MG capsule Take 2 capsules (120 mg total) by mouth daily.   ferrous sulfate 324 MG TBEC Take 324 mg by mouth daily with breakfast.   promethazine (PHENERGAN) 25 MG tablet Take 25 mg by mouth every 6 (six) hours as needed for nausea or vomiting.   Turmeric (QC TUMERIC COMPLEX PO) Take 1,500 mg by mouth daily.     Allergies:   Dilaudid [hydromorphone hcl], Codeine, and Morphine and related   Social History   Socioeconomic History   Marital status: Married    Spouse name: Not on file   Number of children: 2   Years of education: 14   Highest education level: Not on file  Occupational History   Not on file  Tobacco Use   Smoking status: Never   Smokeless tobacco: Never  Vaping Use   Vaping Use: Never used  Substance and Sexual Activity   Alcohol use: No    Alcohol/week: 0.0 standard drinks   Drug use: No   Sexual  activity: Not Currently    Birth control/protection: Surgical  Other Topics Concern   Not on file  Social History Narrative   Lives in [place] with husband and her mother in law lives with them   Grown children.     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    Caffeine use: seldom drinks green tea   Social Determinants of Radio broadcast assistant Strain: Not on file  Food Insecurity: Not on file  Transportation  Needs: Not on file  Physical Activity: Not on file  Stress: Not on file  Social Connections: Not on file     Family History: The patient's family history includes ADD / ADHD in her son; COPD in her mother; Cancer in her father and mother; Depression in her brother, father, and mother; Heart disease in her father and mother; Heart failure in her mother; Lung cancer in her brother and another family member; Other in her sister; Parkinson's disease in her paternal grandfather and sister; Tremor in her maternal grandmother; Varicose Veins in her father and mother.  ROS:   Please see the history of present illness.     All other systems reviewed and are negative.  EKGs/Labs/Other Studies Reviewed:    The following studies were reviewed today:   EKG:   11/25/21: Sinus rhythm, first-degree AV block, incomplete right bundle branch block, rate 71  Recent Labs: No results found for requested labs within last 8760 hours.  Recent Lipid Panel    Component Value Date/Time   CHOL 176 01/02/2019 0227   TRIG 53 01/02/2019 0227   HDL 80 01/02/2019 0227   CHOLHDL 2.2 01/02/2019 0227   VLDL 11 01/02/2019 0227   LDLCALC 85 01/02/2019 0227    Physical Exam:    VS:  BP 112/74    Pulse 79    Ht '5\' 7"'$  (1.702 m)    Wt 141 lb 12.8 oz (64.3 kg)    SpO2 98%    BMI 22.21 kg/m     Wt Readings from Last 3 Encounters:  11/25/21 141 lb 12.8 oz (64.3 kg)  08/09/21 143 lb (64.9 kg)  08/03/21 145 lb (65.8 kg)     GEN:  Well nourished, well developed in no  acute distress HEENT: Normal NECK: No JVD; No carotid bruits LYMPHATICS: No lymphadenopathy CARDIAC: RRR, no murmurs, rubs, gallops RESPIRATORY:  Clear to auscultation without rales, wheezing or rhonchi  ABDOMEN: Soft, non-tender, non-distended MUSCULOSKELETAL:  No edema; No deformity  SKIN: Warm and dry NEUROLOGIC:  Alert and oriented x 3 PSYCHIATRIC:  Normal affect   ASSESSMENT:    1. Chest pain of uncertain etiology   2. Hyperlipidemia, unspecified hyperlipidemia type   3. Coronary artery dissection   4. Anomalous left coronary artery    PLAN:    Chest pain: Description concerning for angina, as describes episode of exertional chest heaviness.  Coronary CTA in 2020 showed calcium score 0 with no CAD, but had LAD myocardial bridge and anomalous left main off RCA with retroaortic course.  Recommend exercise Myoview to evaluate for ischemia.   Check echocardiogram to rule out structural heart disease.  She has been on diltiazem for suspected microvascular dysfunction, can continue.  SCAD: had an MI in 2014 secondary to SCAD of a small OM 2 branch, treated medically.   Hyperlipidemia: LDL 111 on 07/30/2021.  Check calcium score to guide how aggressive to be in lowering cholesterol  RTC in 3 months  Shared Decision Making/Informed Consent The risks [chest pain, shortness of breath, cardiac arrhythmias, dizziness, blood pressure fluctuations, myocardial infarction, stroke/transient ischemic attack, nausea, vomiting, allergic reaction, radiation exposure, metallic taste sensation and life-threatening complications (estimated to be 1 in 10,000)], benefits (risk stratification, diagnosing coronary artery disease, treatment guidance) and alternatives of a nuclear stress test were discussed in detail with [name] and she agrees to proceed.   Medication Adjustments/Labs and Tests Ordered: Current medicines are reviewed at length with the patient today.  Concerns regarding medicines are  outlined above.  Orders Placed This Encounter  Procedures   CT CARDIAC SCORING (SELF PAY ONLY)   MYOCARDIAL PERFUSION IMAGING   EKG 12-Lead   ECHOCARDIOGRAM COMPLETE   No orders of the defined types were placed in this encounter.   Patient Instructions  Medication Instructions:  Your physician recommends that you continue on your current medications as directed. Please refer to the Current Medication list given to you today.  *If you need a refill on your cardiac medications before your next appointment, please call your pharmacy*  Testing/Procedures: Your physician has requested that you have an exercise stress myoview. For further information please visit [email or web address]. Please follow instruction sheet, as given.  Your physician has requested that you have an echocardiogram. Echocardiography is a painless test that uses sound waves to create images of your heart. It provides your doctor with information about the size and shape of your heart and how well your hearts chambers and valves are working. This procedure takes approximately one hour. There are no restrictions for this procedure. This will be done at our [facility] location:  [address]  CT coronary calcium score.   Test locations:  [facility] ([address] [place], [place] [address]) [facility] ([address] [place], [place] [address])   This is $99 out of pocket.   Coronary CalciumScan A coronary calcium scan is an imaging test used to look for deposits of calcium and other fatty materials (plaques) in the inner lining of the blood vessels of the heart (coronary arteries). These deposits of calcium and plaques can partly clog and narrow the coronary arteries without producing any symptoms or warning signs. This puts a person at risk for a heart attack. This test can detect these deposits before symptoms develop. Tell a health care provider about: Any allergies you  have. All medicines you are taking, including vitamins, herbs, eye drops, creams, and over-the-counter medicines. Any problems you or family members have had with anesthetic medicines. Any blood disorders you have. Any surgeries you have had. Any medical conditions you have. Whether you are pregnant or may be pregnant. What are the risks? Generally, this is a safe procedure. However, problems may occur, including: Harm to a pregnant woman and her unborn baby. This test involves the use of radiation. Radiation exposure can be dangerous to a pregnant woman and her unborn baby. If you are pregnant, you generally should not have this procedure done. Slight increase in the risk of cancer. This is because of the radiation involved in the test. What happens before the procedure? No preparation is needed for this procedure. What happens during the procedure? You will undress and remove any jewelry around your neck or chest. You will put on a hospital gown. Sticky electrodes will be placed on your chest. The electrodes will be connected to an electrocardiogram (ECG) machine to record a tracing of the electrical activity of your heart. A CT scanner will take pictures of your heart. During this time, you will be asked to lie still and hold your breath for 2-3 seconds while a picture of your heart is being taken. The procedure may vary among health care providers and hospitals. What happens after the procedure? You can get dressed. You can return to your normal activities. It is up to you to get the results of your test. Ask your health care provider, or the department that is doing the test, when your results will be ready. Summary A coronary  calcium scan is an imaging test used to look for deposits of calcium and other fatty materials (plaques) in the inner lining of the blood vessels of the heart (coronary arteries). Generally, this is a safe procedure. Tell your health care provider if you are  pregnant or may be pregnant. No preparation is needed for this procedure. A CT scanner will take pictures of your heart. You can return to your normal activities after the scan is done. This information is not intended to replace advice given to you by your health care provider. Make sure you discuss any questions you have with your health care provider. Document Released: 03/03/2008 Document Revised: 07/25/2016 Document Reviewed: 07/25/2016 Elsevier Interactive Patient Education  2017 [place]: At [facility], you and your health needs are our priority.  As part of our continuing mission to provide you with exceptional heart care, we have created designated Provider Care Teams.  These Care Teams include your primary Cardiologist (physician) and Advanced Practice Providers (APPs -  Physician Assistants and Nurse Practitioners) who all work together to provide you with the care you need, when you need it.  We recommend signing up for the patient portal called "MyChart".  Sign up information is provided on this After Visit Summary.  MyChart is used to connect with patients for Virtual Visits (Telemedicine).  Patients are able to view lab/test results, encounter notes, upcoming appointments, etc.  Non-urgent messages can be sent to your provider as well.   To learn more about what you can do with MyChart, go to [email or web address].    Your next appointment:   3 month(s)  The format for your next appointment:   In Person  Provider:   Dr. Gardiner Rhyme      Signed, Donato Heinz, MD  11/25/2021 8:40 PM    [facility]

## 2021-11-25 NOTE — Patient Instructions (Signed)
Medication Instructions:  ?Your physician recommends that you continue on your current medications as directed. Please refer to the Current Medication list given to you today. ? ?*If you need a refill on your cardiac medications before your next appointment, please call your pharmacy* ? ?Testing/Procedures: ?Your physician has requested that you have an exercise stress myoview. For further information please visit [email or web address]. Please follow instruction sheet, as given. ? ?Your physician has requested that you have an echocardiogram. Echocardiography is a painless test that uses sound waves to create images of your heart. It provides your doctor with information about the size and shape of your heart and how well your heart?s chambers and valves are working. This procedure takes approximately one hour. There are no restrictions for this procedure. ?This will be done at our [facility] location:  [employer] [employer] [address] ? ?CT coronary calcium score.  ? ?Test locations:  ?[facility] ([address] [place], [place] [address]) ?[facility] ([address] [place], [place] [address])  ? ?This is $99 out of pocket. ? ? ?Coronary CalciumScan ?A coronary calcium scan is an imaging test used to look for deposits of calcium and other fatty materials (plaques) in the inner lining of the blood vessels of the heart (coronary arteries). These deposits of calcium and plaques can partly clog and narrow the coronary arteries without producing any symptoms or warning signs. This puts a person at risk for a heart attack. This test can detect these deposits before symptoms develop. ?Tell a health care provider about: ?Any allergies you have. ?All medicines you are taking, including vitamins, herbs, eye drops, creams, and over-the-counter medicines. ?Any problems you or family members have had with anesthetic medicines. ?Any blood disorders you have. ?Any surgeries you have had. ?Any medical  conditions you have. ?Whether you are pregnant or may be pregnant. ?What are the risks? ?Generally, this is a safe procedure. However, problems may occur, including: ?Harm to a pregnant woman and her unborn baby. This test involves the use of radiation. Radiation exposure can be dangerous to a pregnant woman and her unborn baby. If you are pregnant, you generally should not have this procedure done. ?Slight increase in the risk of cancer. This is because of the radiation involved in the test. ?What happens before the procedure? ?No preparation is needed for this procedure. ?What happens during the procedure? ?You will undress and remove any jewelry around your neck or chest. ?You will put on a hospital gown. ?Sticky electrodes will be placed on your chest. The electrodes will be connected to an electrocardiogram (ECG) machine to record a tracing of the electrical activity of your heart. ?A CT scanner will take pictures of your heart. During this time, you will be asked to lie still and hold your breath for 2-3 seconds while a picture of your heart is being taken. ?The procedure may vary among health care providers and hospitals. ?What happens after the procedure? ?You can get dressed. ?You can return to your normal activities. ?It is up to you to get the results of your test. Ask your health care provider, or the department that is doing the test, when your results will be ready. ?Summary ?A coronary calcium scan is an imaging test used to look for deposits of calcium and other fatty materials (plaques) in the inner lining of the blood vessels of the heart (coronary arteries). ?Generally, this is a safe procedure. Tell your health care provider if you are pregnant or may  be pregnant. ?No preparation is needed for this procedure. ?A CT scanner will take pictures of your heart. ?You can return to your normal activities after the scan is done. ?This information is not intended to replace advice given to you by your  health care provider. Make sure you discuss any questions you have with your health care provider. ?Document Released: 03/03/2008 Document Revised: 07/25/2016 Document Reviewed: 07/25/2016 ?Elsevier Interactive Patient Education ? 2017 Elsevier Inc. ? ? ?Follow-Up: ?At [facility], you and your health needs are our priority.  As part of our continuing mission to provide you with exceptional heart care, we have created designated Provider Care Teams.  These Care Teams include your primary Cardiologist (physician) and Advanced Practice Providers (APPs -  Physician Assistants and Nurse Practitioners) who all work together to provide you with the care you need, when you need it. ? ?We recommend signing up for the patient portal called "MyChart".  Sign up information is provided on this After Visit Summary.  MyChart is used to connect with patients for Virtual Visits (Telemedicine).  Patients are able to view lab/test results, encounter notes, upcoming appointments, etc.  Non-urgent messages can be sent to your provider as well.   ?To learn more about what you can do with MyChart, go to [email or web address].   ? ?Your next appointment:   ?3 month(s) ? ?The format for your next appointment:   ?In Person ? ?Provider:   ?Dr. Gardiner Rhyme ? ? ? ?

## 2021-11-26 ENCOUNTER — Telehealth (HOSPITAL_COMMUNITY): Payer: Self-pay | Admitting: *Deleted

## 2021-11-26 NOTE — Telephone Encounter (Signed)
Close encounter 

## 2021-11-30 ENCOUNTER — Other Ambulatory Visit: Payer: Self-pay

## 2021-11-30 ENCOUNTER — Ambulatory Visit (HOSPITAL_COMMUNITY)
Admission: RE | Admit: 2021-11-30 | Discharge: 2021-11-30 | Disposition: A | Discharge status: Home / Self Care | Payer: Medicare PPO | Source: Ambulatory Visit | Attending: Cardiology | Admitting: Cardiology

## 2021-11-30 ENCOUNTER — Telehealth: Payer: Self-pay | Admitting: Cardiology

## 2021-11-30 DIAGNOSIS — R079 Chest pain, unspecified: Secondary | ICD-10-CM | POA: Diagnosis not present

## 2021-11-30 LAB — MYOCARDIAL PERFUSION IMAGING
Angina Index: 0
Duke Treadmill Score: 10
Estimated workload: 10.1
Exercise duration (min): 10 min
Exercise duration (sec): 1 s
LV dias vol: 67 mL (ref 46–106)
LV sys vol: 24 mL
MPHR: 152 {beats}/min
Nuc Stress EF: 65 %
Peak HR: 151 {beats}/min
Percent HR: 99 %
Rest HR: 66 {beats}/min
Rest Nuclear Isotope Dose: 10.9 mCi
SDS: 2
SRS: 1
SSS: 3
ST Depression (mm): 0 mm
Stress Nuclear Isotope Dose: 31 mCi
TID: 0.97

## 2021-11-30 MED ORDER — TECHNETIUM TC 99M TETROFOSMIN IV KIT
31.0000 | PACK | Freq: Once | INTRAVENOUS | Status: AC | PRN
  Administered 2021-11-30: 31 via INTRAVENOUS
  Filled 2021-11-30: qty 31

## 2021-11-30 MED ORDER — TECHNETIUM TC 99M TETROFOSMIN IV KIT
10.9000 | PACK | Freq: Once | INTRAVENOUS | Status: AC | PRN
  Administered 2021-11-30: 10.9 via INTRAVENOUS
  Filled 2021-11-30: qty 11

## 2021-11-30 NOTE — Telephone Encounter (Signed)
?*  STAT* If patient is at the pharmacy, call can be transferred to refill team. ? ? ?1. Which medications need to be refilled? (please list name of each medication and dose if known)  ?diltiazem (CARDIZEM CD) 120 MG 24 hr capsule ? ?2. Which pharmacy/location (including street and city if local pharmacy) is medication to be sent to?  ?[facility] ? ?3. Do they need a 30 day or 90 day supply? 90 day supply   ? ? ?Patient will be out of medication by Saturday  ?

## 2021-12-06 ENCOUNTER — Other Ambulatory Visit (HOSPITAL_COMMUNITY): Payer: Self-pay

## 2021-12-06 ENCOUNTER — Other Ambulatory Visit: Payer: Self-pay

## 2021-12-06 ENCOUNTER — Other Ambulatory Visit: Payer: Self-pay | Admitting: Cardiology

## 2021-12-06 MED ORDER — DILTIAZEM HCL ER COATED BEADS 120 MG PO CP24
ORAL_CAPSULE | Freq: Every day | ORAL | 1 refills | Status: DC
  Filled 2021-12-06: qty 90, 90d supply, fill #0
  Filled 2022-02-22: qty 90, 90d supply, fill #1

## 2021-12-06 NOTE — Telephone Encounter (Signed)
Called patient, advised that RX was sent to the pharmacy.  ?Patient recently seen Dr.Schumann per last OV note to continue medication.  ? ?

## 2021-12-06 NOTE — Telephone Encounter (Signed)
Patient still has not gotten her medication refilled. The patient is out of medication and is leaving to go out of town on Wednesday  ?

## 2021-12-15 ENCOUNTER — Ambulatory Visit (HOSPITAL_COMMUNITY): Payer: Medicare PPO | Attending: Cardiology

## 2021-12-15 ENCOUNTER — Ambulatory Visit (INDEPENDENT_AMBULATORY_CARE_PROVIDER_SITE_OTHER)
Admission: RE | Admit: 2021-12-15 | Discharge: 2021-12-15 | Disposition: A | Discharge status: Home / Self Care | Payer: Self-pay | Source: Ambulatory Visit | Attending: Cardiology | Admitting: Cardiology

## 2021-12-15 ENCOUNTER — Other Ambulatory Visit: Payer: Self-pay

## 2021-12-15 DIAGNOSIS — E785 Hyperlipidemia, unspecified: Secondary | ICD-10-CM

## 2021-12-15 DIAGNOSIS — R079 Chest pain, unspecified: Secondary | ICD-10-CM | POA: Insufficient documentation

## 2021-12-15 LAB — ECHOCARDIOGRAM COMPLETE
Area-P 1/2: 2.87 cm2
S' Lateral: 2.8 cm

## 2022-01-27 DIAGNOSIS — R7301 Impaired fasting glucose: Secondary | ICD-10-CM | POA: Diagnosis not present

## 2022-02-01 DIAGNOSIS — G43009 Migraine without aura, not intractable, without status migrainosus: Secondary | ICD-10-CM | POA: Diagnosis not present

## 2022-02-01 DIAGNOSIS — R195 Other fecal abnormalities: Secondary | ICD-10-CM | POA: Diagnosis not present

## 2022-02-01 DIAGNOSIS — F332 Major depressive disorder, recurrent severe without psychotic features: Secondary | ICD-10-CM | POA: Diagnosis not present

## 2022-02-01 DIAGNOSIS — R251 Tremor, unspecified: Secondary | ICD-10-CM | POA: Diagnosis not present

## 2022-02-01 DIAGNOSIS — R7301 Impaired fasting glucose: Secondary | ICD-10-CM | POA: Diagnosis not present

## 2022-02-01 DIAGNOSIS — M542 Cervicalgia: Secondary | ICD-10-CM | POA: Diagnosis not present

## 2022-02-01 DIAGNOSIS — M797 Fibromyalgia: Secondary | ICD-10-CM | POA: Diagnosis not present

## 2022-02-22 ENCOUNTER — Other Ambulatory Visit (HOSPITAL_COMMUNITY): Payer: Self-pay

## 2022-02-22 ENCOUNTER — Other Ambulatory Visit (INDEPENDENT_AMBULATORY_CARE_PROVIDER_SITE_OTHER): Payer: Self-pay | Admitting: Internal Medicine

## 2022-02-22 MED ORDER — DICYCLOMINE HCL 10 MG PO CAPS
10.0000 mg | ORAL_CAPSULE | Freq: Two times a day (BID) | ORAL | 5 refills | Status: DC
  Filled 2022-02-22: qty 60, 30d supply, fill #0

## 2022-02-22 NOTE — Telephone Encounter (Signed)
Called patient and she did request med. States she never got the second refill that was sent in and then it was expired.

## 2022-03-02 ENCOUNTER — Other Ambulatory Visit (HOSPITAL_COMMUNITY): Payer: Self-pay

## 2022-03-03 ENCOUNTER — Other Ambulatory Visit (HOSPITAL_COMMUNITY): Payer: Self-pay

## 2022-03-16 ENCOUNTER — Other Ambulatory Visit: Payer: Self-pay | Admitting: *Deleted

## 2022-03-16 ENCOUNTER — Ambulatory Visit (INDEPENDENT_AMBULATORY_CARE_PROVIDER_SITE_OTHER): Payer: Medicare PPO

## 2022-03-16 ENCOUNTER — Encounter: Payer: Self-pay | Admitting: Cardiology

## 2022-03-16 ENCOUNTER — Ambulatory Visit: Payer: Medicare PPO | Admitting: Cardiology

## 2022-03-16 VITALS — BP 110/60 | HR 75 | Ht 67.0 in | Wt 138.0 lb

## 2022-03-16 DIAGNOSIS — E785 Hyperlipidemia, unspecified: Secondary | ICD-10-CM | POA: Diagnosis not present

## 2022-03-16 DIAGNOSIS — R079 Chest pain, unspecified: Secondary | ICD-10-CM

## 2022-03-16 DIAGNOSIS — R002 Palpitations: Secondary | ICD-10-CM | POA: Diagnosis not present

## 2022-03-16 DIAGNOSIS — I2542 Coronary artery dissection: Secondary | ICD-10-CM | POA: Diagnosis not present

## 2022-03-16 DIAGNOSIS — Q245 Malformation of coronary vessels: Secondary | ICD-10-CM | POA: Diagnosis not present

## 2022-03-16 NOTE — Patient Instructions (Signed)
Medication Instructions:  Your physician recommends that you continue on your current medications as directed. Please refer to the Current Medication list given to you today.  *If you need a refill on your cardiac medications before your next appointment, please call your pharmacy*  Testing/Procedures: [place] Monitor Instructions   Your physician has requested you wear a ZIO patch monitor for _14_ days.  This is a single patch monitor.   [employer] supplies one patch monitor per enrollment. Additional stickers are not available. Please do not apply patch if you will be having a Nuclear Stress Test, Echocardiogram, Cardiac CT, MRI, or Chest Xray during the period you would be wearing the monitor. The patch cannot be worn during these tests. You cannot remove and re-apply the ZIO XT patch monitor.  Your ZIO patch monitor will be sent Fed Ex from [employer] directly to your home address. It may take 3-5 days to receive your monitor after you have been enrolled.  Once you have received your monitor, please review the enclosed instructions. Your monitor has already been registered assigning a specific monitor serial # to you.  Billing and Patient Assistance Program Information   We have supplied [employer] with any of your insurance information on file for billing purposes. [employer] offers a sliding scale Patient Assistance Program for patients that do not have insurance, or whose insurance does not completely cover the cost of the ZIO monitor.   You must apply for the Patient Assistance Program to qualify for this discounted rate.     To apply, please call [employer] at [phone], select option 4, then select option 2, and ask to apply for Patient Assistance Program.  Theodore Demark will ask your household income, and how many people are in your household.  They will quote your out-of-pocket cost based on that information.  [employer] will also be able to set up a 54-month interest-free payment plan  if needed.  Applying the monitor   Shave hair from upper left chest.  Hold abrader disc by orange tab. Rub abrader in 40 strokes over the upper left chest as indicated in your monitor instructions.  Clean area with 4 enclosed alcohol pads. Let dry.  Apply patch as indicated in monitor instructions. Patch will be placed under collarbone on left side of chest with arrow pointing upward.  Rub patch adhesive wings for 2 minutes. Remove white label marked "1". Remove the white label marked "2". Rub patch adhesive wings for 2 additional minutes.  While looking in a mirror, press and release button in center of patch. A small green light will flash 3-4 times. This will be your only indicator that the monitor has been turned on. ?  Do not shower for the first 24 hours. You may shower after the first 24 hours.  Press the button if you feel a symptom. You will hear a small click. Record Date, Time and Symptom in the Patient Logbook.  When you are ready to remove the patch, follow instructions on the last 2 pages of the Patient Logbook. Stick patch monitor onto the last page of Patient Logbook.  Place Patient Logbook in the blue and white box.  Use locking tab on box and tape box closed securely.  The blue and white box has prepaid postage on it. Please place it in the mailbox as soon as possible. Your physician should have your test results approximately 7 days after the monitor [facility]  Call [facility]  at [phone] if you have questions regarding your ZIO XT patch monitor. Call them immediately if you see an orange light blinking on your monitor.  If your monitor falls off in less than 4 days, contact our Monitor department at [phone]. ?If your monitor becomes loose or falls off after 4 days call [employer] at [phone] for suggestions on securing your monitor.?  Follow-Up: At [facility], you and your health needs are our priority.  As part of  our continuing mission to provide you with exceptional heart care, we have created designated Provider Care Teams.  These Care Teams include your primary Cardiologist (physician) and Advanced Practice Providers (APPs -  Physician Assistants and Nurse Practitioners) who all work together to provide you with the care you need, when you need it.  We recommend signing up for the patient portal called "MyChart".  Sign up information is provided on this After Visit Summary.  MyChart is used to connect with patients for Virtual Visits (Telemedicine).  Patients are able to view lab/test results, encounter notes, upcoming appointments, etc.  Non-urgent messages can be sent to your provider as well.   To learn more about what you can do with MyChart, go to [email or web address].    Your next appointment:   6 month(s)  The format for your next appointment:   In Person  Provider:   Donato Heinz, MD {    Important Information About Sugar

## 2022-03-16 NOTE — Progress Notes (Unsigned)
Enrolled for [employer] to mail a ZIO XT long term holter monitor to the patients address on file.  

## 2022-03-16 NOTE — Progress Notes (Signed)
Cardiology Office Note:    Date:  03/16/2022   ID:  Stacey Lawrence, Stacey Lawrence 1951/01/11, MRN [id number]  PCP:  Celene Squibb, MD  Cardiologist:  Donato Heinz, MD  Electrophysiologist:  None   Referring MD: Celene Squibb, MD   Chief Complaint  Patient presents with   Chest Pain    History of Present Illness:    Stacey Lawrence is a 71 y.o. female with a hx of SCAD, anomalous left main coronary artery, fibromyalgia, Raynaud's, GERD who presents for follow-up.  She previously followed with Dr. Bronson Ing, last seen in 12/05/2019.  She had an MI in 2014 secondary to SCAD of a small OM 2 branch, treated medically.  Also noted to have mid LAD lesion likely related to myocardial bridging.  Coronary CTA 01/02/2019 showed calcium score 0, anomalous left main coronary arising from right coronary artery with retroaortic course.  She has been on Cardizem for suspected microvascular disease.  Had episode of chest pain and exercise Myoview was done on 11/30/2021, which showed good exercise capacity (10.1 METS), no ST deviation, likely normal perfusion (fixed mid inferolateral perfusion defect with normal wall motion suggests artifact).  Calcium score on 12/15/2021 was 0.  Echocardiogram on 12/15/2021 showed normal biventricular function, no significant valvular disease.  Since last clinic visit, she reports that she is doing okay.  Continues to report occasional chest pain that she describes as tightness in chest.  Denies any dyspnea.  Reports lightheadedness but denies any syncope.  Does report she has been having some lower extremity edema.  Reports has been having palpitations 1-2 times per months but she feels like her heart is racing and then goes slow.  Last for 2 to 3 minutes and resolves.   Past Medical History:  Diagnosis Date   Anemia    hx   Anxiety    CAD (coronary artery disease)    a. 12/2012 NSTEMI/Cath: LM anomalous, arising in R cusp ant to RCA, otw nl, LAD 72mwith bridge, RI nl,  LCX nl, OM2 small, subtl occl w/ thrombus distally - felt to be spont dissection, too small for PCI->Med Rx, RCA large, dom, nl, PDA/PD nl, EF 55% w/ focal HK in mid-dist antlat wall;  b. 05/2013 Lexi CL: EF 77%, old small inferolat scar, no ischemia.   Common migraine with intractable migraine 06/16/2017   Depression    Fibromyalgia    GERD (gastroesophageal reflux disease)    hasn't started taking any meds   History of blood transfusion    265yrago   History of kidney stones    Hx of colonic polyps    IBS (irritable bowel syndrome)    constipation   Joint pain    Joint swelling    Migraine    "q other week" (02/04/2016)   [facility]   Numbness    right leg   Polycythemia    PONV (postoperative nausea and vomiting)    Raynaud's disease    Sciatica of right side 04/21/2020   Scoliosis    Shortness of breath dyspnea    with exertion   SI (sacroiliac) joint dysfunction    right   Wears glasses     Past Surgical History:  Procedure Laterality Date   ABDOMINAL HYSTERECTOMY     partial   APPENDECTOMY     with explor. lap.   CARDIAC CATHETERIZATION     CHOLECYSTECTOMY  10/28/2002   lap.   COLONOSCOPY  COLONOSCOPY WITH PROPOFOL N/A 08/11/2021   Procedure: COLONOSCOPY WITH PROPOFOL;  Surgeon: Rogene Houston, MD;  [facility];  Service: Endoscopy;  Laterality: N/A;  8:30   DEBRIDEMENT TENNIS ELBOW     DIAGNOSTIC LAPAROSCOPY     DILATION AND CURETTAGE OF UTERUS     ELBOW SURGERY     golfer's elbow-bilateral   ESOPHAGOGASTRODUODENOSCOPY  09/10/2012   Procedure: ESOPHAGOGASTRODUODENOSCOPY (EGD);  Surgeon: Rogene Houston, MD;  [facility];  Service: Endoscopy;  Laterality: N/A;  730   EXCISION/RELEASE BURSA HIP Left 02/04/2016   Procedure: LEFT HIP TROCHANTERIC BURSECTOMY;  Surgeon: Mcarthur Rossetti, MD;  Location: [place];  Service: Orthopedics;  Laterality: Left;   I & D EXTREMITY  02/25/2012   Procedure: IRRIGATION AND  DEBRIDEMENT EXTREMITY;  Surgeon: Roseanne Kaufman, MD;  Location: [place];  Service: Orthopedics;  Laterality: Right;  Irrigation and Debridement and Repair Right Thumb Nerve Laceration and Assoiated Structures    KNEE DEBRIDEMENT     right   LAPAROTOMY     LEFT HEART CATHETERIZATION WITH CORONARY ANGIOGRAM N/A 01/16/2013   Procedure: LEFT HEART CATHETERIZATION WITH CORONARY ANGIOGRAM;  Surgeon: Jolaine Artist, MD;  Location: [facility] CATH LAB;  Service: Cardiovascular;  Laterality: N/A;   POLYPECTOMY  08/11/2021   Procedure: POLYPECTOMY;  Surgeon: Rogene Houston, MD;  [facility];  Service: Endoscopy;;   SACROILIAC JOINT FUSION Right 05/02/2019   Procedure: SACROILIAC JOINT FUSION;  Surgeon: Melina Schools, MD;  Location: [place];  Service: Orthopedics;  Laterality: Right;  SACROILIAC JOINT FUSION   STAPEDECTOMY     right   TONSILLECTOMY AND ADENOIDECTOMY     TRIGGER FINGER RELEASE  08/19/2011   Procedure: RELEASE TRIGGER FINGER/A-1 PULLEY;  Surgeon: Willa Frater III;  Location: [place];  Service: Orthopedics;  Laterality: Right;  right middle finger a-1 pulley release with tenosynovectomy   TROCHANTERIC BURSA EXCISION Right 02/04/2016   TUBAL LIGATION     TYMPANOPLASTY      Current Medications: Current Meds  Medication Sig   BIOTIN PO biotin   Calcium Citrate-Vitamin D (CALCITRATE/VITAMIN D PO) Take 1 tablet by mouth daily.   Cholecalciferol (VITAMIN D3) 50 MCG (2000 UT) TABS Take by mouth.   COLLAGEN PO Take by mouth.   dicyclomine (BENTYL) 10 MG capsule Take 1 capsule by mouth 2 (two) times daily before a meal.   diltiazem (CARDIZEM CD) 120 MG 24 hr capsule TAKE 1 CAPSULE BY MOUTH ONCE A DAY   ferrous sulfate 324 MG TBEC Take 324 mg by mouth daily with breakfast.   promethazine (PHENERGAN) 25 MG tablet Take 25 mg by mouth every 6 (six) hours as needed for nausea or vomiting.   Turmeric (QC TUMERIC COMPLEX PO) Take 1,500 mg by mouth daily.     Allergies:    Dilaudid [hydromorphone hcl], Codeine, and Morphine and related   Social History   Socioeconomic History   Marital status: Married    Spouse name: Not on file   Number of children: 2   Years of education: 14   Highest education level: Not on file  Occupational History   Not on file  Tobacco Use   Smoking status: Never   Smokeless tobacco: Never  Vaping Use   Vaping Use: Never used  Substance and Sexual Activity   Alcohol use: No    Alcohol/week: 0.0 standard drinks of alcohol   Drug use: No   Sexual activity: Not Currently  Birth control/protection: Surgical  Other Topics Concern   Not on file  Social History Narrative   Lives in [place] with husband and her mother in law lives with them   Grown children.     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    Caffeine use: seldom drinks green tea   Social Determinants of Radio broadcast assistant Strain: Not on file  Food Insecurity: Not on file  Transportation Needs: Not on file  Physical Activity: Not on file  Stress: Not on file  Social Connections: Not on file     Family History: The patient's family history includes ADD / ADHD in her son; COPD in her mother; Cancer in her father and mother; Depression in her brother, father, and mother; Heart disease in her father and mother; Heart failure in her mother; Lung cancer in her brother and another family member; Other in her sister; Parkinson's disease in her paternal grandfather and sister; Tremor in her maternal grandmother; Varicose Veins in her father and mother.  ROS:   Please see the history of present illness.     All other systems reviewed and are negative.  EKGs/Labs/Other Studies Reviewed:    The following studies were reviewed today:   EKG:   11/25/21: Sinus rhythm, first-degree AV block, incomplete right bundle branch block, rate 71  Recent Labs: No results found for requested labs within last 365 days.  Recent Lipid  Panel    Component Value Date/Time   CHOL 176 01/02/2019 0227   TRIG 53 01/02/2019 0227   HDL 80 01/02/2019 0227   CHOLHDL 2.2 01/02/2019 0227   VLDL 11 01/02/2019 0227   LDLCALC 85 01/02/2019 0227    Physical Exam:    VS:  BP 110/60   Pulse 75   Ht '5\' 7"'$  (1.702 m)   Wt 138 lb (62.6 kg)   SpO2 97%   BMI 21.61 kg/m     Wt Readings from Last 3 Encounters:  03/16/22 138 lb (62.6 kg)  11/30/21 141 lb (64 kg)  11/25/21 141 lb 12.8 oz (64.3 kg)     GEN:  Well nourished, well developed in no acute distress HEENT: Normal NECK: No JVD; No carotid bruits LYMPHATICS: No lymphadenopathy CARDIAC: RRR, no murmurs, rubs, gallops RESPIRATORY:  Clear to auscultation without rales, wheezing or rhonchi  ABDOMEN: Soft, non-tender, non-distended MUSCULOSKELETAL:  No edema; No deformity  SKIN: Warm and dry NEUROLOGIC:  Alert and oriented x 3 PSYCHIATRIC:  Normal affect   ASSESSMENT:    1. Palpitations   2. Chest pain of uncertain etiology   3. Hyperlipidemia, unspecified hyperlipidemia type   4. Anomalous left coronary artery   5. Coronary artery dissection     PLAN:    Chest pain: Description concerning for angina, as describes episode of exertional chest heaviness.  Coronary CTA in 2020 showed calcium score 0 with no CAD, but had LAD myocardial bridge and anomalous left main off RCA with retroaortic course.  Had episode of chest pain and exercise Myoview was done on 11/30/2021, which showed good exercise capacity (10.1 METS), no ST deviation, likely normal perfusion (fixed mid inferolateral perfusion defect with normal wall motion suggests artifact).  Calcium score on 12/15/2021 was 0.  Echocardiogram on 12/15/2021 showed normal biventricular function, no significant valvular disease. -She has been on diltiazem for suspected microvascular dysfunction, can continue.  SCAD: had an MI in 2014 secondary to SCAD of a small OM 2 branch, treated  medically.   Hyperlipidemia: LDL 111 on  07/30/2021.  Calcium score 0 on 12/15/2021  Palpitations: Description concerning for arrhythmia, will evaluate with Zio patch x2 weeks  RTC in 6 months   Medication Adjustments/Labs and Tests Ordered: Current medicines are reviewed at length with the patient today.  Concerns regarding medicines are outlined above.  Orders Placed This Encounter  Procedures   LONG TERM MONITOR (3-14 DAYS)   No orders of the defined types were placed in this encounter.   Patient Instructions  Medication Instructions:  Your physician recommends that you continue on your current medications as directed. Please refer to the Current Medication list given to you today.  *If you need a refill on your cardiac medications before your next appointment, please call your pharmacy*  Testing/Procedures: [place] Monitor Instructions   Your physician has requested you wear a ZIO patch monitor for _14_ days.  This is a single patch monitor.   [employer] supplies one patch monitor per enrollment. Additional stickers are not available. Please do not apply patch if you will be having a Nuclear Stress Test, Echocardiogram, Cardiac CT, MRI, or Chest Xray during the period you would be wearing the monitor. The patch cannot be worn during these tests. You cannot remove and re-apply the ZIO XT patch monitor.  Your ZIO patch monitor will be sent Fed Ex from [employer] directly to your home address. It may take 3-5 days to receive your monitor after you have been enrolled.  Once you have received your monitor, please review the enclosed instructions. Your monitor has already been registered assigning a specific monitor serial # to you.  Billing and Patient Assistance Program Information   We have supplied [employer] with any of your insurance information on file for billing purposes. [employer] offers a sliding scale Patient Assistance Program for patients that do not have insurance, or whose insurance does not  completely cover the cost of the ZIO monitor.   You must apply for the Patient Assistance Program to qualify for this discounted rate.     To apply, please call [employer] at [phone], select option 4, then select option 2, and ask to apply for Patient Assistance Program.  Theodore Demark will ask your household income, and how many people are in your household.  They will quote your out-of-pocket cost based on that information.  [employer] will also be able to set up a 64-month interest-free payment plan if needed.  Applying the monitor   Shave hair from upper left chest.  Hold abrader disc by orange tab. Rub abrader in 40 strokes over the upper left chest as indicated in your monitor instructions.  Clean area with 4 enclosed alcohol pads. Let dry.  Apply patch as indicated in monitor instructions. Patch will be placed under collarbone on left side of chest with arrow pointing upward.  Rub patch adhesive wings for 2 minutes. Remove white label marked "1". Remove the white label marked "2". Rub patch adhesive wings for 2 additional minutes.  While looking in a mirror, press and release button in center of patch. A small green light will flash 3-4 times. This will be your only indicator that the monitor has been turned on. ?  Do not shower for the first 24 hours. You may shower after the first 24 hours.  Press the button if you feel a symptom. You will hear a small click. Record Date, Time and Symptom in the Patient Logbook.  When you are ready to remove the  patch, follow instructions on the last 2 pages of the Patient Logbook. Stick patch monitor onto the last page of Patient Logbook.  Place Patient Logbook in the blue and white box.  Use locking tab on box and tape box closed securely.  The blue and white box has prepaid postage on it. Please place it in the mailbox as soon as possible. Your physician should have your test results approximately 7 days after the monitor has been mailed back to [facility].  Call  [place] at [phone] if you have questions regarding your ZIO XT patch monitor. Call them immediately if you see an orange light blinking on your monitor.  If your monitor falls off in less than 4 days, contact our Monitor department at [phone]. ?If your monitor becomes loose or falls off after 4 days call [employer] at [phone] for suggestions on securing your monitor.?  Follow-Up: At [facility], you and your health needs are our priority.  As part of our continuing mission to provide you with exceptional heart care, we have created designated Provider Care Teams.  These Care Teams include your primary Cardiologist (physician) and Advanced Practice Providers (APPs -  Physician Assistants and Nurse Practitioners) who all work together to provide you with the care you need, when you need it.  We recommend signing up for the patient portal called "MyChart".  Sign up information is provided on this After Visit Summary.  MyChart is used to connect with patients for Virtual Visits (Telemedicine).  Patients are able to view lab/test results, encounter notes, upcoming appointments, etc.  Non-urgent messages can be sent to your provider as well.   To learn more about what you can do with MyChart, go to [email or web address].    Your next appointment:   6 month(s)  The format for your next appointment:   In Person  Provider:   Donato Heinz, MD {    Important Information About Sugar         Signed, Donato Heinz, MD  03/16/2022 9:32 AM    [place]

## 2022-03-24 DIAGNOSIS — R002 Palpitations: Secondary | ICD-10-CM | POA: Diagnosis not present

## 2022-04-18 DIAGNOSIS — R002 Palpitations: Secondary | ICD-10-CM | POA: Diagnosis not present

## 2022-05-05 ENCOUNTER — Ambulatory Visit (INDEPENDENT_AMBULATORY_CARE_PROVIDER_SITE_OTHER): Payer: Medicare PPO | Admitting: Gastroenterology

## 2022-05-05 ENCOUNTER — Encounter (INDEPENDENT_AMBULATORY_CARE_PROVIDER_SITE_OTHER): Payer: Self-pay | Admitting: Gastroenterology

## 2022-05-05 ENCOUNTER — Other Ambulatory Visit (HOSPITAL_COMMUNITY): Payer: Self-pay

## 2022-05-05 VITALS — BP 116/66 | HR 69 | Temp 98.9°F | Ht 67.0 in | Wt 133.8 lb

## 2022-05-05 DIAGNOSIS — R634 Abnormal weight loss: Secondary | ICD-10-CM | POA: Insufficient documentation

## 2022-05-05 DIAGNOSIS — R1310 Dysphagia, unspecified: Secondary | ICD-10-CM | POA: Insufficient documentation

## 2022-05-05 DIAGNOSIS — K219 Gastro-esophageal reflux disease without esophagitis: Secondary | ICD-10-CM

## 2022-05-05 MED ORDER — OMEPRAZOLE 40 MG PO CPDR
40.0000 mg | DELAYED_RELEASE_CAPSULE | Freq: Every day | ORAL | 1 refills | Status: DC
Start: 2022-05-05 — End: 2022-08-08
  Filled 2022-05-05: qty 60, 60d supply, fill #0
  Filled 2022-07-06: qty 60, 60d supply, fill #1

## 2022-05-05 NOTE — Progress Notes (Signed)
Referring Provider: Celene Squibb, MD Primary Care Physician:  Celene Squibb, MD Primary GI Physician: previously [facility]   Chief Complaint  Patient presents with   Gastroesophageal Reflux    GERD. Has lost weight, no appetite, wakes up with gas, chest pain. Tried otc meds with no relief.    HPI:   Stacey Lawrence is a 71 y.o. female with past medical history of anemia, anxiety, CAD, fibromyalgia, GERD, IBS, previous MI, Raynaud's, scoliosis.  Patient presenting today for GERD, dysphagia and weight loss.   History: Last seen October 2022, at that time having changes in bowel habits with gray stools, abdominal cramping and urgency, lost 10 pounds in 6 months. GERD controlled with diet. Trialed on low dose dicyclomine for suspected IBS and scheduled for colonoscopy.   Present:  Patient reports 20 pounds of weight loss in the past 3 months. Reports that she is having some dysphagia as well. She has heartburn and acid regurgitation. She is taking omeprazole '10mg'$  otc meds about 5 days ago without any improvement. She reports that she can get choked on water, dysphagia worsened about a month ago. She has decreased appetite and abdominal pain after eating. She has early satiety and eating more than a small bland meal will worsen her acid reflux. Having some nausea at times but no vomiting. Stools are darker but she does take PO iron. Having a lot of gas as well and belching as well. Drinks alcohol only on occasion. Takes advil very occasionally. Denies rectal bleeding.   Has constipated at baseline, takes a stool softener or other otc intervention. Tried on linzess previously without good results.   Last Colonoscopy:08/11/21 - Diverticulosis in the sigmoid colon and in the transverse colon. - Two small polyps at the splenic flexure and in the transverse colon-2 tubular adenomas and one hyperplastic polyp.  - One 4 to 8 mm polyp at the splenic flexure, removed with a cold snare. Resected  and retrieved. - External hemorrhoids. Last Endoscopy:2013 Small sliding heart hernia. 3 mm hyperplastic appearing polyp at gastric body which was left alone. Nonerosive antral gastritis along with 2 scars. Gastric biopsy taken from antrum for routine histology. These findings would not explain patient's symptoms.  Recommendations:  Repeat in 7 years  Past Medical History:  Diagnosis Date   Anemia    hx   Anxiety    CAD (coronary artery disease)    a. 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 05/2013 Lexi CL: EF 77%, old small inferolat scar, no ischemia.   Common migraine with intractable migraine 06/16/2017   Depression    Fibromyalgia    GERD (gastroesophageal reflux disease)    hasn't started taking any meds   History of blood transfusion    265yrago   History of kidney stones    Hx of colonic polyps    IBS (irritable bowel syndrome)    constipation   Joint pain    Joint swelling    Migraine    "q other week" (02/04/2016)   Myocardial infarction ([id number]   Numbness    right leg   Polycythemia    PONV (postoperative nausea and vomiting)    Raynaud's disease  Sciatica of right side 04/21/2020   Scoliosis    Shortness of breath dyspnea    with exertion   SI (sacroiliac) joint dysfunction    right   Wears glasses     Past Surgical History:  Procedure Laterality Date   ABDOMINAL HYSTERECTOMY     partial   APPENDECTOMY     with explor. lap.   CARDIAC CATHETERIZATION     CHOLECYSTECTOMY  10/28/2002   lap.   COLONOSCOPY     COLONOSCOPY WITH PROPOFOL N/A 08/11/2021   Procedure: COLONOSCOPY WITH PROPOFOL;  Surgeon: Rogene Houston, MD;  [facility];  Service: Endoscopy;  Laterality: N/A;  8:30   DEBRIDEMENT  TENNIS ELBOW     DIAGNOSTIC LAPAROSCOPY     DILATION AND CURETTAGE OF UTERUS     ELBOW SURGERY     golfer's elbow-bilateral   ESOPHAGOGASTRODUODENOSCOPY  09/10/2012   Procedure: ESOPHAGOGASTRODUODENOSCOPY (EGD);  Surgeon: Rogene Houston, MD;  [facility];  Service: Endoscopy;  Laterality: N/A;  730   EXCISION/RELEASE BURSA HIP Left 02/04/2016   Procedure: LEFT HIP TROCHANTERIC BURSECTOMY;  Surgeon: Mcarthur Rossetti, MD;  Location: [place];  Service: Orthopedics;  Laterality: Left;   I & D EXTREMITY  02/25/2012   Procedure: IRRIGATION AND DEBRIDEMENT EXTREMITY;  Surgeon: Roseanne Kaufman, MD;  Location: [place];  Service: Orthopedics;  Laterality: Right;  Irrigation and Debridement and Repair Right Thumb Nerve Laceration and Assoiated Structures    KNEE DEBRIDEMENT     right   LAPAROTOMY     LEFT HEART CATHETERIZATION WITH CORONARY ANGIOGRAM N/A 01/16/2013   Procedure: LEFT HEART CATHETERIZATION WITH CORONARY ANGIOGRAM;  Surgeon: Jolaine Artist, MD;  Location: [facility] LAB;  Service: Cardiovascular;  Laterality: N/A;   POLYPECTOMY  08/11/2021   Procedure: POLYPECTOMY;  Surgeon: Rogene Houston, MD;  [facility];  Service: Endoscopy;;   SACROILIAC JOINT FUSION Right 05/02/2019   Procedure: SACROILIAC JOINT FUSION;  Surgeon: Melina Schools, MD;  Location: [place];  Service: Orthopedics;  Laterality: Right;  SACROILIAC JOINT FUSION   STAPEDECTOMY     right   TONSILLECTOMY AND ADENOIDECTOMY     TRIGGER FINGER RELEASE  08/19/2011   Procedure: RELEASE TRIGGER FINGER/A-1 PULLEY;  Surgeon: Willa Frater III;  Location: [place];  Service: Orthopedics;  Laterality: Right;  right middle finger a-1 pulley release with tenosynovectomy   TROCHANTERIC BURSA EXCISION Right 02/04/2016   TUBAL LIGATION     TYMPANOPLASTY      Current Outpatient Medications  Medication Sig Dispense Refill   BIOTIN PO biotin     Calcium Citrate-Vitamin D (CALCITRATE/VITAMIN D PO)  Take 1 tablet by mouth daily.     Cholecalciferol (VITAMIN D3) 50 MCG (2000 UT) TABS Take by mouth.     COLLAGEN PO Take by mouth.     diltiazem (CARDIZEM CD) 120 MG 24 hr capsule TAKE 1 CAPSULE BY MOUTH ONCE A DAY 90 capsule 1   DULoxetine (CYMBALTA) 60 MG capsule Take 2 capsules (120 mg total) by mouth daily. 180 capsule 0   ferrous sulfate 324 MG TBEC Take 324 mg by mouth daily with breakfast.     omeprazole (PRILOSEC) 10 MG capsule Take 10 mg by mouth daily.     promethazine (PHENERGAN) 25 MG tablet Take 25 mg by mouth every 6 (six) hours as needed for nausea or vomiting.     Turmeric (QC TUMERIC COMPLEX PO) Take 1,500 mg by mouth daily.  dicyclomine (BENTYL) 10 MG capsule Take 1 capsule by mouth 2 (two) times daily before a meal. (Patient not taking: Reported on 05/05/2022) 60 capsule 5   No current facility-administered medications for this visit.    Allergies as of 05/05/2022 - Review Complete 05/05/2022  Allergen Reaction Noted   Dilaudid [hydromorphone hcl] Other (See Comments) 07/27/2011   Codeine Nausea And Vomiting and Rash 07/27/2011   Morphine and related Nausea And Vomiting 02/24/2012    Family History  Problem Relation Age of Onset   Cancer Mother        Deceased with lung CA   Heart failure Mother    Heart disease Mother    COPD Mother    Varicose Veins Mother    Depression Mother    Cancer Father        Deceased with lung CA   Heart disease Father    Varicose Veins Father    Depression Father    Depression Brother    Lung cancer Brother    ADD / ADHD Son    Parkinson's disease Sister    Other Sister        enlarged heart    Tremor Maternal Grandmother    Parkinson's disease Paternal Grandfather    Lung cancer Other        3 aunts, 2 uncles on both sides of family     Social History   Socioeconomic History   Marital status: Married    Spouse name: Not on file   Number of children: 2   Years of education: 14   Highest education level: Not on  file  Occupational History   Not on file  Tobacco Use   Smoking status: Never    Passive exposure: Never   Smokeless tobacco: Never  Vaping Use   Vaping Use: Never used  Substance and Sexual Activity   Alcohol use: No    Alcohol/week: 0.0 standard drinks of alcohol   Drug use: No   Sexual activity: Not Currently    Birth control/protection: Surgical  Other Topics Concern   Not on file  Social History Narrative   Lives in [place] with husband and her mother in law lives with them   Grown children.     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    Caffeine use: seldom drinks green tea   Social Determinants of Radio broadcast assistant Strain: Not on file  Food Insecurity: Not on file  Transportation Needs: Not on file  Physical Activity: Not on file  Stress: Not on file  Social Connections: Not on file   Review of systems General: negative for malaise, night sweats, fever, chills, weight loss Neck: Negative for lumps, goiter, pain and significant neck swelling Resp: Negative for cough, wheezing, dyspnea at rest CV: Negative for chest pain, leg swelling, palpitations, orthopnea GI: denies melena, hematochezia, nausea, vomiting, diarrhea, constipation, odynophagia +dysphagia, +GERD, +early satiety, + weight loss  MSK: Negative for joint pain or swelling, back pain, and muscle pain. Derm: Negative for itching or rash Psych: Denies depression, anxiety, memory loss, confusion. No homicidal or suicidal ideation.  Heme: Negative for prolonged bleeding, bruising easily, and swollen nodes. Endocrine: Negative for cold or heat intolerance, polyuria, polydipsia and goiter. Neuro: negative for tremor, gait imbalance, syncope and seizures. The remainder of the review of systems is noncontributory.  Physical Exam: BP 116/66 (BP Location: Left Arm, Patient Position: Sitting, Cuff Size: Normal)   Pulse 69  Temp 98.9 F (37.2 C) (Oral)   Ht '5\' 7"'$  (1.702  m)   Wt 133 lb 12.8 oz (60.7 kg)   BMI 20.96 kg/m  General:   Alert and oriented. No distress noted. Pleasant and cooperative.  Head:  Normocephalic and atraumatic. Eyes:  Conjuctiva clear without scleral icterus. Mouth:  Oral mucosa pink and moist. Good dentition. No lesions. Heart: Normal rate and rhythm, s1 and s2 heart sounds present.  Lungs: Clear lung sounds in all lobes. Respirations equal and unlabored. Abdomen:  +BS, soft, and non-distended. TTP of epigastric region. No rebound or guarding. No HSM or masses noted. Derm: No palmar erythema or jaundice Msk:  Symmetrical without gross deformities. Normal posture. Extremities:  Without edema. Neurologic:  Alert and  oriented x4 Psych:  Alert and cooperative. Normal mood and affect.  Invalid input(s): "6 MONTHS"   ASSESSMENT: Stacey Lawrence is a 71 y.o. female presenting today with GERD symptoms, dysphagia, and weight loss.   Patient with significant dysphagia, GERD symptoms and 20 pound weight loss. Dysphagia worsened over the past month. Started low dose omeprazole ('10mg'$ ) 5 days ago without improvement. Eating only small amounts of bland food that is soft. Has some occasional nausea. No rectal bleeding. Darker stools in setting of iron pills. Also with a lot of gas/belching. NSAID use is infrequent, as is ETOH. Recommend starting omeprazole '40mg'$  daily and implementing reflux and chewing precautions/avoiding thicker, drier foods as well as EGD for further evaluation as we cannot rule out esophageal ring, web, stricture, stenosis or malignancy. Indications, risks and benefits of procedure discussed in detail with patient. Patient verbalized understanding and is in agreement to proceed with EGD at this time.   PLAN:  Schedule EGD +/-dilation, ASA III 2. Omeprazole '40mg'$  daily  3. Reflux precautions  4. Chewing precautions 5. Boost/ensure protein shakes  All questions were answered, patient verbalized understanding and is in  agreement with plan as outlined above.   Follow Up: 3 months   Sefora Tietje L. Alver Sorrow, MSN, APRN, AGNP-C Adult-Gerontology Nurse Practitioner [facility]

## 2022-05-05 NOTE — H&P (View-Only) (Signed)
Referring Provider: Celene Squibb, MD Primary Care Physician:  Celene Squibb, MD Primary GI Physician: previously [facility]   Chief Complaint  Patient presents with   Gastroesophageal Reflux    GERD. Has lost weight, no appetite, wakes up with gas, chest pain. Tried otc meds with no relief.    HPI:   Stacey Lawrence is a 71 y.o. female with past medical history of anemia, anxiety, CAD, fibromyalgia, GERD, IBS, previous MI, Raynaud's, scoliosis.  Patient presenting today for GERD, dysphagia and weight loss.   History: Last seen October 2022, at that time having changes in bowel habits with gray stools, abdominal cramping and urgency, lost 10 pounds in 6 months. GERD controlled with diet. Trialed on low dose dicyclomine for suspected IBS and scheduled for colonoscopy.   Present:  Patient reports 20 pounds of weight loss in the past 3 months. Reports that she is having some dysphagia as well. She has heartburn and acid regurgitation. She is taking omeprazole '10mg'$  otc meds about 5 days ago without any improvement. She reports that she can get choked on water, dysphagia worsened about a month ago. She has decreased appetite and abdominal pain after eating. She has early satiety and eating more than a small bland meal will worsen her acid reflux. Having some nausea at times but no vomiting. Stools are darker but she does take PO iron. Having a lot of gas as well and belching as well. Drinks alcohol only on occasion. Takes advil very occasionally. Denies rectal bleeding.   Has constipated at baseline, takes a stool softener or other otc intervention. Tried on linzess previously without good results.   Last Colonoscopy:08/11/21 - Diverticulosis in the sigmoid colon and in the transverse colon. - Two small polyps at the splenic flexure and in the transverse colon-2 tubular adenomas and one hyperplastic polyp.  - One 4 to 8 mm polyp at the splenic flexure, removed with a cold snare. Resected  and retrieved. - External hemorrhoids. Last Endoscopy:2013 Small sliding heart hernia. 3 mm hyperplastic appearing polyp at gastric body which was left alone. Nonerosive antral gastritis along with 2 scars. Gastric biopsy taken from antrum for routine histology. These findings would not explain patient's symptoms.  Recommendations:  Repeat in 7 years  Past Medical History:  Diagnosis Date   Anemia    hx   Anxiety    CAD (coronary artery disease)    a. 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 05/2013 [facility] CL: EF 77%, old small inferolat scar, no ischemia.   Common migraine with intractable migraine 06/16/2017   Depression    Fibromyalgia    GERD (gastroesophageal reflux disease)    hasn't started taking any meds   History of blood transfusion    285yrago   History of kidney stones    Hx of colonic polyps    IBS (irritable bowel syndrome)    constipation   Joint pain    Joint swelling    Migraine    "q other week" (02/04/2016)   [facility] infarction ([id number]   Numbness    right leg   Polycythemia    PONV (postoperative nausea and vomiting)    Raynaud's disease  Sciatica of right side 04/21/2020   Scoliosis    Shortness of breath dyspnea    with exertion   SI (sacroiliac) joint dysfunction    right   Wears glasses     Past Surgical History:  Procedure Laterality Date   ABDOMINAL HYSTERECTOMY     partial   APPENDECTOMY     with explor. lap.   CARDIAC CATHETERIZATION     CHOLECYSTECTOMY  10/28/2002   lap.   COLONOSCOPY     COLONOSCOPY WITH PROPOFOL N/A 08/11/2021   Procedure: COLONOSCOPY WITH PROPOFOL;  Surgeon: Rogene Houston, MD;  [facility];  Service: Endoscopy;  Laterality: N/A;  8:30   DEBRIDEMENT  TENNIS ELBOW     DIAGNOSTIC LAPAROSCOPY     DILATION AND CURETTAGE OF UTERUS     ELBOW SURGERY     golfer's elbow-bilateral   ESOPHAGOGASTRODUODENOSCOPY  09/10/2012   Procedure: ESOPHAGOGASTRODUODENOSCOPY (EGD);  Surgeon: Rogene Houston, MD;  [facility];  Service: Endoscopy;  Laterality: N/A;  730   EXCISION/RELEASE BURSA HIP Left 02/04/2016   Procedure: LEFT HIP TROCHANTERIC BURSECTOMY;  Surgeon: Mcarthur Rossetti, MD;  Location: [place];  Service: Orthopedics;  Laterality: Left;   I & D EXTREMITY  02/25/2012   Procedure: IRRIGATION AND DEBRIDEMENT EXTREMITY;  Surgeon: Roseanne Kaufman, MD;  Location: [place];  Service: Orthopedics;  Laterality: Right;  Irrigation and Debridement and Repair Right Thumb Nerve Laceration and Assoiated Structures    KNEE DEBRIDEMENT     right   LAPAROTOMY     LEFT HEART CATHETERIZATION WITH CORONARY ANGIOGRAM N/A 01/16/2013   Procedure: LEFT HEART CATHETERIZATION WITH CORONARY ANGIOGRAM;  Surgeon: Jolaine Artist, MD;  Location: [facility] C[facility] LAB;  Service: Cardiovascular;  Laterality: N/A;   POLYPECTOMY  08/11/2021   Procedure: POLYPECTOMY;  Surgeon: Rogene Houston, MD;  [facility];  Service: Endoscopy;;   SACROILIAC JOINT FUSION Right 05/02/2019   Procedure: SACROILIAC JOINT FUSION;  Surgeon: Melina Schools, MD;  Location: [place];  Service: Orthopedics;  Laterality: Right;  SACROILIAC JOINT FUSION   STAPEDECTOMY     right   TONSILLECTOMY AND ADENOIDECTOMY     TRIGGER FINGER RELEASE  08/19/2011   Procedure: RELEASE TRIGGER FINGER/A-1 PULLEY;  Surgeon: Willa Frater III;  Location: [place];  Service: Orthopedics;  Laterality: Right;  right middle finger a-1 pulley release with tenosynovectomy   TROCHANTERIC BURSA EXCISION Right 02/04/2016   TUBAL LIGATION     TYMPANOPLASTY      Current Outpatient Medications  Medication Sig Dispense Refill   BIOTIN PO biotin     Calcium Citrate-Vitamin D (CALCITRATE/VITAMIN D PO)  Take 1 tablet by mouth daily.     Cholecalciferol (VITAMIN D3) 50 MCG (2000 UT) TABS Take by mouth.     COLLAGEN PO Take by mouth.     diltiazem (CARDIZEM CD) 120 MG 24 hr capsule TAKE 1 CAPSULE BY MOUTH ONCE A DAY 90 capsule 1   DULoxetine (CYMBALTA) 60 MG capsule Take 2 capsules (120 mg total) by mouth daily. 180 capsule 0   ferrous sulfate 324 MG TBEC Take 324 mg by mouth daily with breakfast.     omeprazole (PRILOSEC) 10 MG capsule Take 10 mg by mouth daily.     promethazine (PHENERGAN) 25 MG tablet Take 25 mg by mouth every 6 (six) hours as needed for nausea or vomiting.     Turmeric (QC TUMERIC COMPLEX PO) Take 1,500 mg by mouth daily.  dicyclomine (BENTYL) 10 MG capsule Take 1 capsule by mouth 2 (two) times daily before a meal. (Patient not taking: Reported on 05/05/2022) 60 capsule 5   No current facility-administered medications for this visit.    Allergies as of 05/05/2022 - Review Complete 05/05/2022  Allergen Reaction Noted   Dilaudid [hydromorphone hcl] Other (See Comments) 07/27/2011   Codeine Nausea And Vomiting and Rash 07/27/2011   Morphine and related Nausea And Vomiting 02/24/2012    Family History  Problem Relation Age of Onset   Cancer Mother        Deceased with lung CA   Heart failure Mother    Heart disease Mother    COPD Mother    Varicose Veins Mother    Depression Mother    Cancer Father        Deceased with lung CA   Heart disease Father    Varicose Veins Father    Depression Father    Depression Brother    Lung cancer Brother    ADD / ADHD Son    Parkinson's disease Sister    Other Sister        enlarged heart    Tremor Maternal Grandmother    Parkinson's disease Paternal Grandfather    Lung cancer Other        3 aunts, 2 uncles on both sides of family     Social History   Socioeconomic History   Marital status: Married    Spouse name: Not on file   Number of children: 2   Years of education: 14   Highest education level: Not on  file  Occupational History   Not on file  Tobacco Use   Smoking status: Never    Passive exposure: Never   Smokeless tobacco: Never  Vaping Use   Vaping Use: Never used  Substance and Sexual Activity   Alcohol use: No    Alcohol/week: 0.0 standard drinks of alcohol   Drug use: No   Sexual activity: Not Currently    Birth control/protection: Surgical  Other Topics Concern   Not on file  Social History Narrative   Lives in [place] with husband and her mother in law lives with them   Grown children.     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    Caffeine use: seldom drinks green tea   Social Determinants of Radio broadcast assistant Strain: Not on file  Food Insecurity: Not on file  Transportation Needs: Not on file  Physical Activity: Not on file  Stress: Not on file  Social Connections: Not on file   Review of systems General: negative for malaise, night sweats, fever, chills, weight loss Neck: Negative for lumps, goiter, pain and significant neck swelling Resp: Negative for cough, wheezing, dyspnea at rest CV: Negative for chest pain, leg swelling, palpitations, orthopnea GI: denies melena, hematochezia, nausea, vomiting, diarrhea, constipation, odynophagia +dysphagia, +GERD, +early satiety, + weight loss  MSK: Negative for joint pain or swelling, back pain, and muscle pain. Derm: Negative for itching or rash Psych: Denies depression, anxiety, memory loss, confusion. No homicidal or suicidal ideation.  Heme: Negative for prolonged bleeding, bruising easily, and swollen nodes. Endocrine: Negative for cold or heat intolerance, polyuria, polydipsia and goiter. Neuro: negative for tremor, gait imbalance, syncope and seizures. The remainder of the review of systems is noncontributory.  Physical Exam: BP 116/66 (BP Location: Left Arm, Patient Position: Sitting, Cuff Size: Normal)   Pulse 69  Temp 98.9 F (37.2 C) (Oral)   Ht '5\' 7"'$  (1.702  m)   Wt 133 lb 12.8 oz (60.7 kg)   BMI 20.96 kg/m  General:   Alert and oriented. No distress noted. Pleasant and cooperative.  Head:  Normocephalic and atraumatic. Eyes:  Conjuctiva clear without scleral icterus. Mouth:  Oral mucosa pink and moist. Good dentition. No lesions. Heart: Normal rate and rhythm, s1 and s2 heart sounds present.  Lungs: Clear lung sounds in all lobes. Respirations equal and unlabored. Abdomen:  +BS, soft, and non-distended. TTP of epigastric region. No rebound or guarding. No HSM or masses noted. Derm: No palmar erythema or jaundice Msk:  Symmetrical without gross deformities. Normal posture. Extremities:  Without edema. Neurologic:  Alert and  oriented x4 Psych:  Alert and cooperative. Normal mood and affect.  Invalid input(s): "6 MONTHS"   ASSESSMENT: ASHAYA RAFTERY is a 71 y.o. female presenting today with GERD symptoms, dysphagia, and weight loss.   Patient with significant dysphagia, GERD symptoms and 20 pound weight loss. Dysphagia worsened over the past month. Started low dose omeprazole ('10mg'$ ) 5 days ago without improvement. Eating only small amounts of bland food that is soft. Has some occasional nausea. No rectal bleeding. Darker stools in setting of iron pills. Also with a lot of gas/belching. NSAID use is infrequent, as is ETOH. Recommend starting omeprazole '40mg'$  daily and implementing reflux and chewing precautions/avoiding thicker, drier foods as well as EGD for further evaluation as we cannot rule out esophageal ring, web, stricture, stenosis or malignancy. Indications, risks and benefits of procedure discussed in detail with patient. Patient verbalized understanding and is in agreement to proceed with EGD at this time.   PLAN:  Schedule EGD +/-dilation, ASA III 2. Omeprazole '40mg'$  daily  3. Reflux precautions  4. Chewing precautions 5. Boost/ensure protein shakes  All questions were answered, patient verbalized understanding and is in  agreement with plan as outlined above.   Follow Up: 3 months   Camden Knotek L. Alver Sorrow, MSN, APRN, AGNP-C Adult-Gerontology Nurse Practitioner [facility]

## 2022-05-05 NOTE — Patient Instructions (Signed)
It was nice to meet you ! We will get you scheduled for EGD for further evaluation of your symptoms Please avoid NSAIDs (advil, aleve, naproxen, goody powder, ibuprofen) as these can be very hard on your GI tract, causing inflammation, ulcers and damage to the lining of your GI tract.  I have sent omeprazole '40mg'$  to your pharmacy, please take this 30 minutes prior to breakfast, avoid greasy, spicy, tomato based and citrus foods, be mindful that alcohol and caffeine can worsen GERD Make sure to avoid thicker, dryer foods, chew well and take small bites Can try adding in boost or ensure protein shakes to help with added nutrition since you are unable to eat adequately   Follow up 3 months

## 2022-05-10 ENCOUNTER — Encounter (INDEPENDENT_AMBULATORY_CARE_PROVIDER_SITE_OTHER): Payer: Self-pay

## 2022-05-10 ENCOUNTER — Other Ambulatory Visit (INDEPENDENT_AMBULATORY_CARE_PROVIDER_SITE_OTHER): Payer: Self-pay

## 2022-05-10 DIAGNOSIS — G8929 Other chronic pain: Secondary | ICD-10-CM

## 2022-05-10 DIAGNOSIS — R1310 Dysphagia, unspecified: Secondary | ICD-10-CM

## 2022-05-11 ENCOUNTER — Encounter (INDEPENDENT_AMBULATORY_CARE_PROVIDER_SITE_OTHER): Payer: Self-pay

## 2022-05-27 ENCOUNTER — Encounter (HOSPITAL_COMMUNITY): Payer: Self-pay

## 2022-05-27 ENCOUNTER — Other Ambulatory Visit (HOSPITAL_COMMUNITY): Payer: Self-pay

## 2022-05-27 ENCOUNTER — Encounter (HOSPITAL_COMMUNITY)
Admission: RE | Admit: 2022-05-27 | Discharge: 2022-05-27 | Disposition: A | Discharge status: Home / Self Care | Payer: Medicare PPO | Source: Ambulatory Visit | Attending: Gastroenterology | Admitting: Gastroenterology

## 2022-05-27 DIAGNOSIS — N390 Urinary tract infection, site not specified: Secondary | ICD-10-CM | POA: Diagnosis not present

## 2022-05-27 HISTORY — DX: Other complications of anesthesia, initial encounter: T88.59XA

## 2022-05-27 MED ORDER — SULFAMETHOXAZOLE-TRIMETHOPRIM 800-160 MG PO TABS
1.0000 | ORAL_TABLET | Freq: Two times a day (BID) | ORAL | 0 refills | Status: DC
  Filled 2022-05-27: qty 10, 5d supply, fill #0

## 2022-05-27 NOTE — Patient Instructions (Signed)
Stacey Lawrence  05/27/2022     '@PREFPERIOPPHARMACY'$ @   Your procedure is scheduled on 05/31/2022.  Report to [facility] at 12:20 PM  Call this number if you have problems the morning of surgery:  [phone]   Remember:  Please follow the diet and prep instructions given to you by the doctor's office     Take these medicines the morning of surgery with A SIP OF WATER : cardiazem Cymbalta Prilosec    Do not wear jewelry, make-up or nail polish.  Do not wear lotions, powders, or perfumes, or deodorant.  Do not shave 48 hours prior to surgery.  Men may shave face and neck.  Do not bring valuables to the hospital.  [facility] is not responsible for any belongings or valuables.  Contacts, dentures or bridgework may not be worn into surgery.  Leave your suitcase in the car.  After surgery it may be brought to your room.  For patients admitted to the hospital, discharge time will be determined by your treatment team.  Patients discharged the day of surgery will not be allowed to drive home.   Name and phone number of your driver:   family Special instructions:  N/A  Please read over the following fact sheets that you were given. Care and Recovery After Surgery    Esophageal Dilatation Esophageal dilatation, also called esophageal dilation, is a procedure to widen or open a blocked or narrowed part of the esophagus. The esophagus is the part of the body that moves food and liquid from the mouth to the stomach. You may need this procedure if: You have a buildup of scar tissue in your esophagus that makes it difficult, painful, or impossible to swallow. This can be caused by gastroesophageal reflux disease (GERD). You have cancer of the esophagus. There is a problem with how food moves through your esophagus. In some cases, you may need this procedure repeated at a later time to dilate the esophagus gradually. Tell a health care provider about: Any allergies you have. All  medicines you are taking, including vitamins, herbs, eye drops, creams, and over-the-counter medicines. Any problems you or family members have had with anesthetic medicines. Any blood disorders you have. Any surgeries you have had. Any medical conditions you have. Any antibiotic medicines you are required to take before dental procedures. Whether you are pregnant or may be pregnant. What are the risks? Generally, this is a safe procedure. However, problems may occur, including: Bleeding due to a tear in the lining of the esophagus. A hole, or perforation, in the esophagus. What happens before the procedure? Ask your health care provider about: Changing or stopping your regular medicines. This is especially important if you are taking diabetes medicines or blood thinners. Taking medicines such as aspirin and ibuprofen. These medicines can thin your blood. Do not take these medicines unless your health care provider tells you to take them. Taking over-the-counter medicines, vitamins, herbs, and supplements. Follow instructions from your health care provider about eating or drinking restrictions. Plan to have a responsible adult take you home from the hospital or clinic. Plan to have a responsible adult care for you for the time you are told after you leave the hospital or clinic. This is important. What happens during the procedure? You may be given a medicine to help you relax (sedative). A numbing medicine may be sprayed into the back of your throat, or you may gargle the medicine. Your health care provider may perform  the dilatation using various surgical instruments, such as: Simple dilators. This instrument is carefully placed in the esophagus to stretch it. Guided wire bougies. This involves using an endoscope to insert a wire into the esophagus. A dilator is passed over this wire to enlarge the esophagus. Then the wire is removed. Balloon dilators. An endoscope with a small balloon is  inserted into the esophagus. The balloon is inflated to stretch the esophagus and open it up. The procedure may vary among health care providers and hospitals. What can I expect after the procedure? Your blood pressure, heart rate, breathing rate, and blood oxygen level will be monitored until you leave the hospital or clinic. Your throat may feel slightly sore and numb. This will get better over time. You will not be allowed to eat or drink until your throat is no longer numb. When you are able to drink, urinate, and sit on the edge of the bed without nausea or dizziness, you may be able to return home. Follow these instructions at home: Take over-the-counter and prescription medicines only as told by your health care provider. If you were given a sedative during the procedure, it can affect you for several hours. Do not drive or operate machinery until your health care provider says that it is safe. Plan to have a responsible adult care for you for the time you are told. This is important. Follow instructions from your health care provider about any eating or drinking restrictions. Do not use any products that contain nicotine or tobacco, such as cigarettes, e-cigarettes, and chewing tobacco. If you need help quitting, ask your health care provider. Keep all follow-up visits. This is important. Contact a health care provider if: You have a fever. You have pain that is not relieved by medicine. Get help right away if: You have chest pain. You have trouble breathing. You have trouble swallowing. You vomit blood. You have black, tarry, or bloody stools. These symptoms may represent a serious problem that is an emergency. Do not wait to see if the symptoms will go away. Get medical help right away. Call your local emergency services (911 in the [place] Do not drive yourself to the hospital. Summary Esophageal dilatation, also called esophageal dilation, is a procedure to widen or open a blocked  or narrowed part of the esophagus. Plan to have a responsible adult take you home from the hospital or clinic. For this procedure, a numbing medicine may be sprayed into the back of your throat, or you may gargle the medicine. Do not drive or operate machinery until your health care provider says that it is safe. This information is not intended to replace advice given to you by your health care provider. Make sure you discuss any questions you have with your health care provider. Document Revised: 01/22/2020 Document Reviewed: 01/22/2020 Elsevier Patient Education  [facility], Adult Upper endoscopy is a procedure to look inside the upper GI (gastrointestinal) tract. The upper GI tract is made up of: The esophagus. This is the part of the body that moves food from your mouth to your stomach. The stomach. The duodenum. This is the first part of your small intestine. This procedure is also called esophagogastroduodenoscopy (EGD) or gastroscopy. In this procedure, your health care provider passes a thin, flexible tube (endoscope) through your mouth and down your esophagus into your stomach and into your duodenum. A small camera is attached to the end of the tube. Images from the camera appear on  a monitor in the exam room. During this procedure, your health care provider may also remove a small piece of tissue to be sent to a lab and examined under a microscope (biopsy). Your health care provider may do an upper endoscopy to diagnose cancers of the upper GI tract. You may also have this procedure to find the cause of other conditions, such as: Stomach pain. Heartburn. Pain or problems when swallowing. Nausea and vomiting. Stomach bleeding. Stomach ulcers. Tell a health care provider about: Any allergies you have. All medicines you are taking, including vitamins, herbs, eye drops, creams, and over-the-counter medicines. Any problems you or family members have had with  anesthetic medicines. Any bleeding problems you have. Any surgeries you have had. Any medical conditions you have. Whether you are pregnant or may be pregnant. What are the risks? Your healthcare provider will talk with you about risks. These may include: Infection. Bleeding. Allergic reactions to medicines. A tear or hole (perforation) in the esophagus, stomach, or duodenum. What happens before the procedure? When to stop eating and drinking Follow instructions from your health care provider about what you may eat and drink. These may include: 8 hours before your procedure Stop eating most foods. Do not eat meat, fried foods, or fatty foods. Eat only light foods, such as toast or crackers. All liquids are okay except energy drinks and alcohol. 6 hours before your procedure Stop eating. Drink only clear liquids, such as water, clear fruit juice, black coffee, plain tea, and sports drinks. Do not drink energy drinks or alcohol. 2 hours before your procedure Stop drinking all liquids. You may be allowed to take medicines with small sips of water. If you do not follow your health care provider's instructions, your procedure may be delayed or canceled. Medicines Ask your health care provider about: Changing or stopping your regular medicines. This is especially important if you are taking diabetes medicines or blood thinners. Taking medicines such as aspirin and ibuprofen. These medicines can thin your blood. Do not take these medicines unless your health care provider tells you to take them. Taking over-the-counter medicines, vitamins, herbs, and supplements. General instructions If you will be going home right after the procedure, plan to have a responsible adult: Take you home from the hospital or clinic. You will not be allowed to drive. Care for you for the time you are told. What happens during the procedure?  An IV will be inserted into one of your veins. You may be given one  or more of the following: A medicine to help you relax (sedative). A medicine to numb the throat (local anesthetic). You will lie on your left side on an exam table. Your health care provider will pass the endoscope through your mouth and down your esophagus. Your health care provider will use the scope to check the inside of your esophagus, stomach, and duodenum. Biopsies may be taken. The endoscope will be removed. The procedure may vary among health care providers and hospitals. What happens after the procedure? Your blood pressure, heart rate, breathing rate, and blood oxygen level will be monitored until you leave the hospital or clinic. When your throat is no longer numb, you may be given some fluids to drink. If you were given a sedative during the procedure, it can affect you for several hours. Do not drive or operate machinery until your health care provider says that it is safe. It is up to you to get the results of your procedure. Ask your  health care provider, or the department that is doing the procedure, when your results will be ready. Contact a health care provider if you: Have a sore throat that lasts longer than 1 day. Have a fever. Get help right away if you: Vomit blood or your vomit looks like coffee grounds. Have bloody, black, or tarry stools. Have a very bad sore throat or you cannot swallow. Have difficulty breathing or very bad pain in your chest or abdomen. These symptoms may be an emergency. Get help right away. Call 911. Do not wait to see if the symptoms will go away. Do not drive yourself to the hospital. Summary Upper endoscopy is a procedure to look inside the upper GI tract. During the procedure, an IV will be inserted into one of your veins. You may be given a medicine to help you relax. The endoscope will be passed through your mouth and down your esophagus. Follow instructions from your health care provider about what you can eat and drink. This  information is not intended to replace advice given to you by your health care provider. Make sure you discuss any questions you have with your health care provider. Document Revised: 12/15/2021 Document Reviewed: 12/15/2021 Elsevier Patient Education  [place].

## 2022-05-31 ENCOUNTER — Ambulatory Visit (HOSPITAL_BASED_OUTPATIENT_CLINIC_OR_DEPARTMENT_OTHER): Payer: Medicare PPO | Admitting: Anesthesiology

## 2022-05-31 ENCOUNTER — Other Ambulatory Visit: Payer: Self-pay

## 2022-05-31 ENCOUNTER — Encounter (HOSPITAL_COMMUNITY): Payer: Self-pay | Admitting: Gastroenterology

## 2022-05-31 ENCOUNTER — Encounter (HOSPITAL_COMMUNITY)
Admission: RE | Disposition: A | Discharge status: Home / Self Care | Payer: Self-pay | Source: Home / Self Care | Attending: Gastroenterology

## 2022-05-31 ENCOUNTER — Ambulatory Visit (HOSPITAL_COMMUNITY)
Admission: RE | Admit: 2022-05-31 | Discharge: 2022-05-31 | Disposition: A | Discharge status: Home / Self Care | Payer: Medicare PPO | Attending: Gastroenterology | Admitting: Gastroenterology

## 2022-05-31 ENCOUNTER — Ambulatory Visit (HOSPITAL_COMMUNITY): Payer: Medicare PPO | Admitting: Anesthesiology

## 2022-05-31 DIAGNOSIS — R6881 Early satiety: Secondary | ICD-10-CM | POA: Insufficient documentation

## 2022-05-31 DIAGNOSIS — I252 Old myocardial infarction: Secondary | ICD-10-CM

## 2022-05-31 DIAGNOSIS — M797 Fibromyalgia: Secondary | ICD-10-CM | POA: Insufficient documentation

## 2022-05-31 DIAGNOSIS — I2510 Atherosclerotic heart disease of native coronary artery without angina pectoris: Secondary | ICD-10-CM | POA: Insufficient documentation

## 2022-05-31 DIAGNOSIS — K449 Diaphragmatic hernia without obstruction or gangrene: Secondary | ICD-10-CM | POA: Diagnosis not present

## 2022-05-31 DIAGNOSIS — K589 Irritable bowel syndrome without diarrhea: Secondary | ICD-10-CM | POA: Insufficient documentation

## 2022-05-31 DIAGNOSIS — I7300 Raynaud's syndrome without gangrene: Secondary | ICD-10-CM | POA: Insufficient documentation

## 2022-05-31 DIAGNOSIS — R1013 Epigastric pain: Secondary | ICD-10-CM | POA: Diagnosis not present

## 2022-05-31 DIAGNOSIS — G8929 Other chronic pain: Secondary | ICD-10-CM

## 2022-05-31 DIAGNOSIS — K319 Disease of stomach and duodenum, unspecified: Secondary | ICD-10-CM | POA: Diagnosis not present

## 2022-05-31 DIAGNOSIS — K219 Gastro-esophageal reflux disease without esophagitis: Secondary | ICD-10-CM | POA: Insufficient documentation

## 2022-05-31 DIAGNOSIS — K2950 Unspecified chronic gastritis without bleeding: Secondary | ICD-10-CM | POA: Diagnosis not present

## 2022-05-31 DIAGNOSIS — R1310 Dysphagia, unspecified: Secondary | ICD-10-CM | POA: Insufficient documentation

## 2022-05-31 DIAGNOSIS — R634 Abnormal weight loss: Secondary | ICD-10-CM | POA: Diagnosis not present

## 2022-05-31 DIAGNOSIS — F418 Other specified anxiety disorders: Secondary | ICD-10-CM

## 2022-05-31 DIAGNOSIS — I25119 Atherosclerotic heart disease of native coronary artery with unspecified angina pectoris: Secondary | ICD-10-CM | POA: Diagnosis not present

## 2022-05-31 HISTORY — PX: BIOPSY: SHX5522

## 2022-05-31 HISTORY — PX: ESOPHAGEAL DILATION: SHX303

## 2022-05-31 HISTORY — PX: ESOPHAGOGASTRODUODENOSCOPY (EGD) WITH PROPOFOL: SHX5813

## 2022-05-31 SURGERY — ESOPHAGOGASTRODUODENOSCOPY (EGD) WITH PROPOFOL
Anesthesia: General

## 2022-05-31 MED ORDER — ONDANSETRON HCL 4 MG/2ML IJ SOLN
4.0000 mg | Freq: Once | INTRAMUSCULAR | Status: AC
  Administered 2022-05-31: 4 mg via INTRAVENOUS

## 2022-05-31 MED ORDER — PROPOFOL 500 MG/50ML IV EMUL
INTRAVENOUS | Status: DC | PRN
  Administered 2022-05-31: 150 ug/kg/min via INTRAVENOUS

## 2022-05-31 MED ORDER — ONDANSETRON HCL 4 MG/2ML IJ SOLN
INTRAMUSCULAR | Status: AC
  Filled 2022-05-31: qty 2

## 2022-05-31 MED ORDER — LACTATED RINGERS IV SOLN: INTRAVENOUS | Status: DC

## 2022-05-31 NOTE — Op Note (Signed)
[facility] Patient Name: Stacey Lawrence Procedure Date: 05/31/2022 2:10 PM MRN: [id number] Date of Birth: [birth year] Attending MD: Maylon Peppers ,  CSN: [id number] Age: 71 Admit Type: Outpatient Procedure:                Upper GI endoscopy Indications:              Epigastric abdominal pain, Dysphagia, Early satiety Providers:                Maylon Peppers, Janeece Riggers, RN, Thomas Hoff., Technician Referring MD:              Medicines:                Monitored Anesthesia Care Complications:            No immediate complications. Estimated Blood Loss:     Estimated blood loss: none. Procedure:                Pre-Anesthesia Assessment:                           - Prior to the procedure, a History and Physical                            was performed, and patient medications, allergies                            and sensitivities were reviewed. The patient's                            tolerance of previous anesthesia was reviewed.                           - The risks and benefits of the procedure and the                            sedation options and risks were discussed with the                            patient. All questions were answered and informed                            consent was obtained.                           - ASA Grade Assessment: III - A patient with severe                            systemic disease.                           After obtaining informed consent, the endoscope was                            passed under direct vision. Throughout the  procedure, the patient's blood pressure, pulse, and                            oxygen saturations were monitored continuously. The                            GIF-H190 ([id number]) scope was introduced through the                            mouth, and advanced to the second part of duodenum.                            The upper GI endoscopy was  accomplished without                            difficulty. The patient tolerated the procedure                            well. Scope In: 2:28:33 PM Scope Out: 2:35:31 PM Total Procedure Duration: 0 hours 6 minutes 58 seconds  Findings:      No endoscopic abnormality was evident in the esophagus to explain the       patient's complaint of dysphagia. It was decided, however, to proceed       with dilation of the entire esophagus. A guidewire was placed and the       scope was withdrawn. Dilation was performed with a Savary dilator with       moderate resistance at 18 mm. Mucosal disruption was seen upon       reinspection of the upper and lower third of the esophagus.      A 2 cm hiatal hernia was present.      The gastroesophageal flap valve was visualized endoscopically and       classified as Hill Grade IV (no fold, wide open lumen, hiatal hernia       present).      The entire examined stomach was normal. Biopsies were taken with a cold       forceps for Helicobacter pylori testing.      The examined duodenum was normal. Impression:               - No endoscopic esophageal abnormality to explain                            patient's dysphagia. Esophagus dilated. Dilated.                           - 2 cm hiatal hernia.                           - Gastroesophageal flap valve classified as Hill                            Grade IV (no fold, wide open lumen, hiatal hernia                            present).                           -  Normal stomach. Biopsied.                           - Normal examined duodenum. Moderate Sedation:      Per Anesthesia Care Recommendation:           - Discharge patient to home (ambulatory).                           - Resume previous diet.                           - Await pathology results.                           - Continue present medications.                           - If persistent satiety and abdominal discomfort,                             will need to proceed with gastric emptying study. Procedure Code(s):        --- Professional ---                           [phone], Esophagogastroduodenoscopy, flexible,                            transoral; with insertion of guide wire followed by                            passage of dilator(s) through esophagus over guide                            wire                           43239, 59, Esophagogastroduodenoscopy, flexible,                            transoral; with biopsy, single or multiple Diagnosis Code(s):        --- Professional ---                           R13.10, Dysphagia, unspecified                           K44.9, Diaphragmatic hernia without obstruction or                            gangrene                           R10.13, Epigastric pain                           R68.81, Early satiety CPT copyright 2019 American Medical Association. All rights reserved. The codes documented in this report are preliminary and upon coder review  may  be revised to meet current compliance requirements. Maylon Peppers, MD Maylon Peppers,  05/31/2022 2:42:33 PM This report has been signed electronically. Number of Addenda: 0

## 2022-05-31 NOTE — Transfer of Care (Signed)
Immediate Anesthesia Transfer of Care Note  Patient: Stacey Lawrence  Procedure(s) Performed: ESOPHAGOGASTRODUODENOSCOPY (EGD) WITH PROPOFOL ESOPHAGEAL DILATION BIOPSY  Patient Location: PACU and Short Stay  Anesthesia Type:General  Level of Consciousness: awake, drowsy and patient cooperative  Airway & Oxygen Therapy: Patient Spontanous Breathing  Post-op Assessment: Report given to RN, Post -op Vital signs reviewed and stable and Patient moving all extremities X 4  Post vital signs: Reviewed and stable  Last Vitals:  Vitals Value Taken Time  BP 86/53 05/31/22 1439  Temp 36.4 C 05/31/22 1439  Pulse 92 05/31/22 1439  Resp 20 05/31/22 1439  SpO2 94 % 05/31/22 1439    Last Pain:  Vitals:   05/31/22 1439  TempSrc: Oral  PainSc: 0-No pain      Patients Stated Pain Goal: 6 (38/68/54 8830)  Complications: No notable events documented.

## 2022-05-31 NOTE — Interval H&P Note (Signed)
History and Physical Interval Note:  05/31/2022 1:44 PM  Stacey Lawrence  has presented today for surgery, with the diagnosis of Dysphagia epigastric pain.  The various methods of treatment have been discussed with the patient and family. After consideration of risks, benefits and other options for treatment, the patient has consented to  Procedure(s) with comments: ESOPHAGOGASTRODUODENOSCOPY (EGD) WITH PROPOFOL (N/A) - 205 ASA 3 ESOPHAGEAL DILATION (N/A) as a surgical intervention.  The patient's history has been reviewed, patient examined, no change in status, stable for surgery.  I have reviewed the patient's chart and labs.  Questions were answered to the patient's satisfaction.     [name]

## 2022-05-31 NOTE — Discharge Instructions (Signed)
You are being discharged to home.  Resume your previous diet.  We are waiting for your pathology results.  Continue your present medications.  If persistent satiety and abdominal discomfort, will need to proceed with gastric emptying study.

## 2022-05-31 NOTE — Anesthesia Preprocedure Evaluation (Signed)
Anesthesia Evaluation  Patient identified by MRN, date of birth, ID band Patient awake    Reviewed: Allergy & Precautions, H&P , NPO status , Patient's Chart, lab work & pertinent test results  History of Anesthesia Complications (+) PONV, PROLONGED EMERGENCE and history of anesthetic complications  Airway Mallampati: II  TM Distance: >3 FB Neck ROM: Full    Dental  (+) Dental Advisory Given Crown :   Pulmonary shortness of breath and with exertion,    Pulmonary exam normal breath sounds clear to auscultation       Cardiovascular + angina + CAD and + Past MI  Normal cardiovascular exam Rhythm:Regular Rate:Normal     Neuro/Psych  Headaches, PSYCHIATRIC DISORDERS Anxiety Depression  Neuromuscular disease    GI/Hepatic Neg liver ROS, GERD  Medicated and Controlled,  Endo/Other  negative endocrine ROS  Renal/GU negative Renal ROS  negative genitourinary   Musculoskeletal  (+) Fibromyalgia -  Abdominal   Peds negative pediatric ROS (+)  Hematology  (+) Blood dyscrasia, anemia ,   Anesthesia Other Findings 1. Mild chronic retrolisthesis at C5-C6 with associated disc,endplate, and ligamentous degeneration. Mild spinal stenosis. Mild if any cord mass effect, and no cordsignal abnormality. But moderate to severe C6 neural foraminal stenosis greater on the left.  2. Chronic disc and endplate degeneration at C6-C7 with moderate to severe left C7 foraminal stenosis.  3. Mild to moderate left neural foraminal stenosis at C7-T1 related to endplate and facet spurring.  Reproductive/Obstetrics negative OB ROS                             Anesthesia Physical  Anesthesia Plan  ASA: 3  Anesthesia Plan: General   Post-op Pain Management: Minimal or no pain anticipated   Induction:   PONV Risk Score and Plan: TIVA  Airway Management Planned: Nasal Cannula and Natural Airway  Additional  Equipment:   Intra-op Plan:   Post-operative Plan:   Informed Consent: I have reviewed the patients History and Physical, chart, labs and discussed the procedure including the risks, benefits and alternatives for the proposed anesthesia with the patient or authorized representative who has indicated his/her understanding and acceptance.     Dental advisory given  Plan Discussed with: CRNA and Surgeon  Anesthesia Plan Comments:         Anesthesia Quick Evaluation

## 2022-05-31 NOTE — Anesthesia Postprocedure Evaluation (Signed)
Anesthesia Post Note  Patient: DELMI FULFER  Procedure(s) Performed: ESOPHAGOGASTRODUODENOSCOPY (EGD) WITH PROPOFOL ESOPHAGEAL DILATION BIOPSY  Patient location during evaluation: Phase II Anesthesia Type: General Level of consciousness: awake and alert and oriented Pain management: pain level controlled Vital Signs Assessment: post-procedure vital signs reviewed and stable Respiratory status: spontaneous breathing, nonlabored ventilation and respiratory function stable Cardiovascular status: blood pressure returned to baseline and stable Postop Assessment: no apparent nausea or vomiting Anesthetic complications: no   No notable events documented.   Last Vitals:  Vitals:   05/31/22 1439 05/31/22 1446  BP: (!) 86/53 110/66  Pulse: 92 89  Resp: 20 17  Temp: 36.4 C   SpO2: 94% 94%    Last Pain:  Vitals:   05/31/22 1439  TempSrc: Oral  PainSc: 0-No pain                 Zerrick Hanssen C Tavoris Brisk

## 2022-06-01 ENCOUNTER — Other Ambulatory Visit (HOSPITAL_COMMUNITY): Payer: Self-pay | Admitting: Obstetrics and Gynecology

## 2022-06-01 ENCOUNTER — Other Ambulatory Visit (HOSPITAL_COMMUNITY): Payer: Self-pay | Admitting: Internal Medicine

## 2022-06-01 DIAGNOSIS — Z1231 Encounter for screening mammogram for malignant neoplasm of breast: Secondary | ICD-10-CM

## 2022-06-02 LAB — SURGICAL PATHOLOGY

## 2022-06-07 ENCOUNTER — Encounter (HOSPITAL_COMMUNITY): Payer: Self-pay | Admitting: Gastroenterology

## 2022-06-09 ENCOUNTER — Ambulatory Visit (HOSPITAL_COMMUNITY)
Admission: RE | Admit: 2022-06-09 | Discharge: 2022-06-09 | Disposition: A | Discharge status: Home / Self Care | Payer: Medicare PPO | Source: Ambulatory Visit | Attending: Obstetrics and Gynecology | Admitting: Obstetrics and Gynecology

## 2022-06-09 DIAGNOSIS — Z1231 Encounter for screening mammogram for malignant neoplasm of breast: Secondary | ICD-10-CM | POA: Diagnosis not present

## 2022-06-15 ENCOUNTER — Telehealth (INDEPENDENT_AMBULATORY_CARE_PROVIDER_SITE_OTHER): Payer: Self-pay

## 2022-06-15 NOTE — Telephone Encounter (Signed)
Reviewed chart. Looks like Dr. Jenetta Downer recommended proceeding with gastric emptying study if she continued with symptoms after her EGD. Would be ok to go ahead and get this scheduled.   With her cardiac history, would recommended she follow-up with cardiology on her chest pain as well and proceed to the emergency room if any persistent chest pain, feeling fait, SOB, or weak as women can present with atypical symptoms with a heart attack.

## 2022-06-15 NOTE — Telephone Encounter (Signed)
Forwarded to [name] to schedule GES

## 2022-06-15 NOTE — Telephone Encounter (Signed)
Patient called today states she is still having some of the same symptoms she was having prior to her EGD done on 05/31/2022. She says she was placed on Prilosec 40 mg once daily 30 minutes prior to breakfast, this has not helped. She says she is still having nausea, weigh loss,headache, abdominal discomfort, chest burning and left sided chest pain that she says is not cardiac related as she says she has had a heart attack before and this is not a heart attack. She is also on a reflux diet. She reports to have lost 20 lbs in two months. Patient uses [facility] pharmacy if anything else is sent in. Please advise.  Egd note   Entire stomach examined norma and  duodenum was normal. - No endoscopic esophageal abnormality to explain patient's dysphagia. Esophagus dilated. Dilated. - 2 cm hiatal hernia. - Gastroesophageal flap valve classified as Hill Grade IV (no fold, wide open lumen, hiatal hernia present). - Normal stomach. Biopsied. - Normal examined duodenum.hing else is sent in. Please advise.  SURGICAL PATHOLOGY  CASE: [id number]  PATIENT: Stacey Lawrence  Surgical Pathology Report      Clinical History: dysphagia epigastric pain      FINAL MICROSCOPIC DIAGNOSIS:   A. STOMACH, BIOPSY:  Reactive gastropathy with minimal chronic gastritis  Negative for H. pylori, intestinal metaplasia, dysplasia and carcinoma

## 2022-06-15 NOTE — Telephone Encounter (Signed)
Ann, please set up gastric emptying study. Thanks,  I spoke with the patient regarding all, and advised that if she has persistent pain,shortness of breath dizziness/faintness or weakness to proceed to the nearest Ed as women can present with atypical symptoms with a heart attack. Patient states understanding, says she has seen her cardiologist recently and has been through a battery of tests as of recent and all her cardiac testing was normal. She is aware we will be making a referral to have a gastric emptying study.

## 2022-06-16 ENCOUNTER — Other Ambulatory Visit (INDEPENDENT_AMBULATORY_CARE_PROVIDER_SITE_OTHER): Payer: Self-pay

## 2022-06-16 DIAGNOSIS — R112 Nausea with vomiting, unspecified: Secondary | ICD-10-CM

## 2022-06-16 DIAGNOSIS — R110 Nausea: Secondary | ICD-10-CM

## 2022-06-16 DIAGNOSIS — R634 Abnormal weight loss: Secondary | ICD-10-CM

## 2022-06-16 DIAGNOSIS — G8929 Other chronic pain: Secondary | ICD-10-CM

## 2022-06-22 ENCOUNTER — Ambulatory Visit (HOSPITAL_COMMUNITY)
Admission: RE | Admit: 2022-06-22 | Discharge: 2022-06-22 | Disposition: A | Discharge status: Home / Self Care | Payer: Medicare PPO | Source: Ambulatory Visit | Attending: Gastroenterology | Admitting: Gastroenterology

## 2022-06-22 ENCOUNTER — Encounter (HOSPITAL_COMMUNITY): Payer: Self-pay

## 2022-06-22 DIAGNOSIS — G8929 Other chronic pain: Secondary | ICD-10-CM | POA: Diagnosis not present

## 2022-06-22 DIAGNOSIS — R112 Nausea with vomiting, unspecified: Secondary | ICD-10-CM | POA: Insufficient documentation

## 2022-06-22 DIAGNOSIS — R110 Nausea: Secondary | ICD-10-CM | POA: Diagnosis not present

## 2022-06-22 DIAGNOSIS — R109 Unspecified abdominal pain: Secondary | ICD-10-CM | POA: Diagnosis not present

## 2022-06-22 DIAGNOSIS — R1013 Epigastric pain: Secondary | ICD-10-CM | POA: Diagnosis not present

## 2022-06-22 DIAGNOSIS — R634 Abnormal weight loss: Secondary | ICD-10-CM | POA: Diagnosis not present

## 2022-06-22 HISTORY — DX: Heart failure, unspecified: I50.9

## 2022-06-22 MED ORDER — TECHNETIUM TC 99M SULFUR COLLOID
2.0000 | Freq: Once | INTRAVENOUS | Status: AC | PRN
  Administered 2022-06-22: 2.2 via ORAL

## 2022-06-23 NOTE — Telephone Encounter (Signed)
Spoke to patient regarding the findings of recent gastric emptying study which showed rapid gastric emptying without clear reason for this.  She has never had any gastric surgeries.  States that she has lost 15 pounds since the last time she was seen in the office.  Has felt very weak and endorses having episode of orthostasis.  Had recent heart monitor which only showed 1 episode of transient tachycardia.  I wonder if her symptoms are related to POTS.  I will reach her cardiologist to discuss the possibility of the symptoms being driven by POTS.  I advised her to increase the intake of water and use compression stockings. We will also prescribe her Zofran for now.   Given her weight loss, we will proceed with a CT of the chest, abdomen, and pelvis with IV contrast.  Diagnosis weight loss. [name], please schedule this test.

## 2022-06-24 ENCOUNTER — Encounter (INDEPENDENT_AMBULATORY_CARE_PROVIDER_SITE_OTHER): Payer: Self-pay

## 2022-06-24 ENCOUNTER — Other Ambulatory Visit (INDEPENDENT_AMBULATORY_CARE_PROVIDER_SITE_OTHER): Payer: Self-pay

## 2022-06-24 DIAGNOSIS — R634 Abnormal weight loss: Secondary | ICD-10-CM

## 2022-06-24 NOTE — Telephone Encounter (Signed)
Thanks   I did speak with her cardiologist and he agreed she may symptoms suggestive of POTS, she will have her orthostatics checked during her next appointment with them

## 2022-06-27 MED ORDER — BARIUM SULFATE 2 % PO SUSP
ORAL | Status: AC
  Filled 2022-06-27: qty 2

## 2022-06-28 DIAGNOSIS — N951 Menopausal and female climacteric states: Secondary | ICD-10-CM | POA: Diagnosis not present

## 2022-06-28 DIAGNOSIS — R102 Pelvic and perineal pain: Secondary | ICD-10-CM | POA: Diagnosis not present

## 2022-07-02 ENCOUNTER — Ambulatory Visit (HOSPITAL_BASED_OUTPATIENT_CLINIC_OR_DEPARTMENT_OTHER)
Admission: RE | Admit: 2022-07-02 | Discharge: 2022-07-02 | Disposition: A | Discharge status: Home / Self Care | Payer: Medicare PPO | Source: Ambulatory Visit | Attending: Gastroenterology | Admitting: Gastroenterology

## 2022-07-02 DIAGNOSIS — I700 Atherosclerosis of aorta: Secondary | ICD-10-CM | POA: Insufficient documentation

## 2022-07-02 DIAGNOSIS — K769 Liver disease, unspecified: Secondary | ICD-10-CM | POA: Diagnosis not present

## 2022-07-02 DIAGNOSIS — R634 Abnormal weight loss: Secondary | ICD-10-CM | POA: Diagnosis not present

## 2022-07-02 DIAGNOSIS — Z90710 Acquired absence of both cervix and uterus: Secondary | ICD-10-CM | POA: Insufficient documentation

## 2022-07-02 DIAGNOSIS — Z9049 Acquired absence of other specified parts of digestive tract: Secondary | ICD-10-CM | POA: Insufficient documentation

## 2022-07-02 DIAGNOSIS — J984 Other disorders of lung: Secondary | ICD-10-CM | POA: Diagnosis not present

## 2022-07-02 MED ORDER — IOHEXOL 300 MG/ML  SOLN
100.0000 mL | Freq: Once | INTRAMUSCULAR | Status: AC | PRN
  Administered 2022-07-02: 100 mL via INTRAVENOUS

## 2022-07-06 ENCOUNTER — Other Ambulatory Visit (HOSPITAL_COMMUNITY): Payer: Self-pay

## 2022-07-06 ENCOUNTER — Other Ambulatory Visit: Payer: Self-pay | Admitting: Cardiology

## 2022-07-06 MED ORDER — DILTIAZEM HCL ER COATED BEADS 120 MG PO CP24
120.0000 mg | ORAL_CAPSULE | Freq: Every day | ORAL | 1 refills | Status: DC
  Filled 2022-07-06: qty 90, 90d supply, fill #0

## 2022-07-08 ENCOUNTER — Telehealth: Payer: Medicare PPO | Admitting: Psychiatry

## 2022-07-11 ENCOUNTER — Other Ambulatory Visit (HOSPITAL_COMMUNITY): Payer: Self-pay

## 2022-07-19 ENCOUNTER — Ambulatory Visit (INDEPENDENT_AMBULATORY_CARE_PROVIDER_SITE_OTHER): Payer: Medicare PPO | Admitting: Gastroenterology

## 2022-07-19 ENCOUNTER — Ambulatory Visit: Payer: Medicare PPO | Admitting: Cardiology

## 2022-07-21 ENCOUNTER — Ambulatory Visit: Payer: Medicare PPO | Admitting: Cardiology

## 2022-07-25 ENCOUNTER — Ambulatory Visit: Payer: Medicare PPO | Attending: Physician Assistant | Admitting: Physician Assistant

## 2022-07-25 ENCOUNTER — Encounter: Payer: Self-pay | Admitting: Physician Assistant

## 2022-07-25 VITALS — BP 110/60 | HR 57 | Ht 67.0 in | Wt 129.4 lb

## 2022-07-25 DIAGNOSIS — I2542 Coronary artery dissection: Secondary | ICD-10-CM | POA: Diagnosis not present

## 2022-07-25 DIAGNOSIS — R42 Dizziness and giddiness: Secondary | ICD-10-CM

## 2022-07-25 DIAGNOSIS — R102 Pelvic and perineal pain: Secondary | ICD-10-CM | POA: Diagnosis not present

## 2022-07-25 NOTE — Patient Instructions (Addendum)
Medication Instructions:  STOP Diltiazem   *If you need a refill on your cardiac medications before your next appointment, please call your pharmacy*  Lab Work: NONE ordered at this time of appointment   If you have labs (blood work) drawn today and your tests are completely normal, you will receive your results only by: [place] (if you have MyChart) OR A paper copy in the mail If you have any lab test that is abnormal or we need to change your treatment, we will call you to review the results.  Testing/Procedures: NONE ordered at this time of appointment   Follow-Up: At [facility], you and your health needs are our priority.  As part of our continuing mission to provide you with exceptional heart care, we have created designated Provider Care Teams.  These Care Teams include your primary Cardiologist (physician) and Advanced Practice Providers (APPs -  Physician Assistants and Nurse Practitioners) who all work together to provide you with the care you need, when you need it.  Your next appointment:   As previously scheduled   The format for your next appointment:   In Person  Provider:   Donato Heinz, MD     Other Instructions  Important Information About Sugar

## 2022-07-25 NOTE — Progress Notes (Unsigned)
Cardiology Office Note:    Date:  07/27/2022   ID:  Stacey, Lawrence 07-07-1951, MRN [id number]  PCP:  Celene Squibb, MD   [facility] Cardiologist:  Donato Heinz, MD     Referring MD: Celene Squibb, MD   Chief Complaint  Patient presents with   Follow-up    Seen for Dr. Gardiner Rhyme    History of Present Illness:    Stacey Lawrence is a 71 y.o. female with a hx of SCAD, anomalous left main coronary artery, fibromyalgia, Raynaud's disease and GERD.  Patient was previously followed by Dr. Bronson Ing.  She had an MI in 2014 secondary to SCAD of a small OM2 branch, this was treated medically.  She was also noted to have mid LAD lesion likely related to myocardial bridging.  A coronary CTA obtained in April 2020 demonstrated coronary calcium score of 0, anomalous left main coronary artery arising from the right coronary artery with retroaortic course.  She has been placed on Cardizem for suspected microvascular disease.  Myoview was performed in March 2023 for chest pain which showed good exercise capacity, 10.1 METS of activity, no significant EKG changes.  Likely normal perfusion with artifact.  Coronary calcium score in March 2023 was 0.  Echocardiogram in March 2023 showed a normal biventricular function, no significant valve issue.  Patient was last seen by Dr. Gardiner Rhyme in June 2023 at which time she reported lightheadedness but no syncope.  She also reported palpitation 1-2 times per month where she feels her heart was racing then go slow.  Heart monitor was placed which came back showing largely normal sinus rhythm with one episode of nonsustained VT lasting only 4 beats.  Minimal heart rate 48, maximal heart rate 162, average heart rate 73.  PVC burden was less than 1%.  Patient recently underwent EGD by gastroenterology service for dysphagia, this revealed a 2 cm hiatal hernia, however no significant finding to explain her dysphagia.  Patient presents today  for 10-monthfollow-up.  See orthostatic signs below.  Upon standing, his blood pressure does drops a little and heart rate increases by about 24 bpm.  She says she is fatigued and dizzy all the time.  Her blood pressure drops down to low 100 range.  She had extensive work-up earlier this year.  She is going to see her GI doctor as she still has epigastric pain.  She has chronic constipation.  I recommended increase fluid intake and salt intake to help with blood pressure.  I will discontinue her diltiazem which was ultimately prescribed for microvascular disease, however I am not confident her chest pain was truly coming from microvascular disease.  Coming off of the diltiazem is going to allow her to have slightly higher blood pressure and heart rate.  She can follow-up in 4 to 6 months with Dr. SGardiner Rhyme  Lying 118/72 heart rate 56 Sitting 117/71 heart rate 65 Standing 103/64 heart rate 74 Standing 3-minute 107/67 heart rate 80.  Past Medical History:  Diagnosis Date   Anemia    hx   Anxiety    CAD (coronary artery disease)    a. 12/2012 NSTEMI/Cath: LM anomalous, arising in R cusp ant to RCA, otw nl, LAD [address], RI nl, LCX nl, OM2 small, subtl occl w/ thrombus distally - felt to be spont dissection, too small for PCI->Med Rx, RCA large, dom, nl, PDA/PD nl, EF 55% w/ focal HK in mid-dist antlat wall;  b.  05/2013 Lexi CL: EF 77%, old small inferolat scar, no ischemia.   CHF (congestive heart failure) (HCC)    Common migraine with intractable migraine 46/50/3546   Complication of anesthesia    dificulty waking up   Depression    Fibromyalgia    GERD (gastroesophageal reflux disease)    hasn't started taking any meds   History of blood transfusion    72yr ago   History of kidney stones    Hx of colonic polyps    IBS (irritable bowel syndrome)    constipation   Joint pain    Joint swelling    Migraine    "q other week" (02/04/2016)   Myocardial infarction ([facility] 2014   Numbness     right leg   Polycythemia    PONV (postoperative nausea and vomiting)    Raynaud's disease    Sciatica of right side 04/21/2020   Scoliosis    Shortness of breath dyspnea    with exertion   SI (sacroiliac) joint dysfunction    right   Wears glasses     Past Surgical History:  Procedure Laterality Date   ABDOMINAL HYSTERECTOMY     partial   APPENDECTOMY     with explor. lap.   BIOPSY  05/31/2022   Procedure: BIOPSY;  Surgeon: CHarvel Quale MD;  [facility];  Service: Gastroenterology;;   CARDIAC CATHETERIZATION     CHOLECYSTECTOMY  10/28/2002   lap.   COLONOSCOPY     COLONOSCOPY WITH PROPOFOL N/A 08/11/2021   Procedure: COLONOSCOPY WITH PROPOFOL;  Surgeon: RRogene Houston MD;  [facility];  Service: Endoscopy;  Laterality: N/A;  8:30   DEBRIDEMENT TENNIS ELBOW     DIAGNOSTIC LAPAROSCOPY     DILATION AND CURETTAGE OF UTERUS     ELBOW SURGERY     golfer's elbow-bilateral   ESOPHAGEAL DILATION N/A 05/31/2022   Procedure: ESOPHAGEAL DILATION;  Surgeon: CHarvel Quale MD;  [facility];  Service: Gastroenterology;  Laterality: N/A;   ESOPHAGOGASTRODUODENOSCOPY  09/10/2012   Procedure: ESOPHAGOGASTRODUODENOSCOPY (EGD);  Surgeon: NRogene Houston MD;  [facility];  Service: Endoscopy;  Laterality: N/A;  730   ESOPHAGOGASTRODUODENOSCOPY (EGD) WITH PROPOFOL N/A 05/31/2022   Procedure: ESOPHAGOGASTRODUODENOSCOPY (EGD) WITH PROPOFOL;  Surgeon: CHarvel Quale MD;  [facility];  Service: Gastroenterology;  Laterality: N/A;  205 ASA 3   EXCISION/RELEASE BURSA HIP Left 02/04/2016   Procedure: LEFT HIP TROCHANTERIC BURSECTOMY;  Surgeon: CMcarthur Rossetti MD;  [place] [facility]  Service: Orthopedics;  Laterality: Left;   I & D EXTREMITY  02/25/2012   Procedure: IRRIGATION AND DEBRIDEMENT EXTREMITY;  Surgeon: WRoseanne Kaufman MD;  [place] [facility]  Service: Orthopedics;  Laterality: Right;   Irrigation and Debridement and Repair Right Thumb Nerve Laceration and Assoiated Structures    KNEE DEBRIDEMENT     right   LAPAROTOMY     LEFT HEART CATHETERIZATION WITH CORONARY ANGIOGRAM N/A 01/16/2013   Procedure: LEFT HEART CATHETERIZATION WITH CORONARY ANGIOGRAM;  Surgeon: DJolaine Artist MD;  [facility];  Service: Cardiovascular;  Laterality: N/A;   POLYPECTOMY  08/11/2021   Procedure: POLYPECTOMY;  Surgeon: RRogene Houston MD;  [facility];  Service: Endoscopy;;   SACROILIAC JOINT FUSION Right 05/02/2019   Procedure: SACROILIAC JOINT FUSION;  Surgeon: BMelina Schools MD;  [place] [facility]  Service: Orthopedics;  Laterality: Right;  SACROILIAC JOINT FUSION   STAPEDECTOMY     right  TONSILLECTOMY AND ADENOIDECTOMY     TRIGGER FINGER RELEASE  08/19/2011   Procedure: RELEASE TRIGGER FINGER/A-1 PULLEY;  Surgeon: Willa Frater III;  Location: [place];  Service: Orthopedics;  Laterality: Right;  right middle finger a-1 pulley release with tenosynovectomy   TRIGGER FINGER RELEASE     x 5   TROCHANTERIC BURSA EXCISION Right 02/04/2016   TUBAL LIGATION     TYMPANOPLASTY      Current Medications: Current Meds  Medication Sig   BIOTIN PO Take 1 tablet by mouth daily.   Calcium Citrate-Vitamin D (CALCITRATE/VITAMIN D PO) Take 1 tablet by mouth daily.   Cholecalciferol (VITAMIN D3) 50 MCG (2000 UT) TABS Take 2,000 Units by mouth daily.   COLLAGEN PO Take 1 tablet by mouth daily.   dicyclomine (BENTYL) 10 MG capsule Take 1 capsule by mouth 2 (two) times daily before a meal.   ferrous sulfate 324 MG TBEC Take 324 mg by mouth daily with breakfast.   omeprazole (PRILOSEC) 40 MG capsule Take 1 capsule (40 mg total) by mouth daily.   sulfamethoxazole-trimethoprim (BACTRIM DS) 800-160 MG tablet Take 1 tablet by mouth every 12 (twelve) hours for 5 days   Turmeric (QC TUMERIC COMPLEX PO) Take 1,500 mg by mouth daily.   [DISCONTINUED] diltiazem  (CARDIZEM CD) 120 MG 24 hr capsule Take 1 capsule (120 mg total) by mouth daily.     Allergies:   Dilaudid [hydromorphone hcl], Codeine, and Morphine and related   Social History   Socioeconomic History   Marital status: Married    Spouse name: Not on file   Number of children: 2   Years of education: 14   Highest education level: Not on file  Occupational History   Not on file  Tobacco Use   Smoking status: Never    Passive exposure: Never   Smokeless tobacco: Never  Vaping Use   Vaping Use: Never used  Substance and Sexual Activity   Alcohol use: No    Alcohol/week: 0.0 standard drinks of alcohol   Drug use: No   Sexual activity: Not Currently    Birth control/protection: Surgical  Other Topics Concern   Not on file  Social History Narrative   Lives in [place] with husband and her mother in law lives with them   Grown children.     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    Caffeine use: seldom drinks green tea   Social Determinants of Radio broadcast assistant Strain: Not on file  Food Insecurity: Not on file  Transportation Needs: Not on file  Physical Activity: Not on file  Stress: Not on file  Social Connections: Not on file     Family History: The patient's family history includes ADD / ADHD in her son; COPD in her mother; Cancer in her father and mother; Depression in her brother, father, and mother; Heart disease in her father and mother; Heart failure in her mother; Lung cancer in her brother and another family member; Other in her sister; Parkinson's disease in her paternal grandfather and sister; Tremor in her maternal grandmother; Varicose Veins in her father and mother.  ROS:   Please see the history of present illness.     All other systems reviewed and are negative.  EKGs/Labs/Other Studies Reviewed:    The following studies were reviewed today:  Myoview 11/30/2021   Findings are consistent with prior myocardial  infarction. The study is low risk.  No ST deviation was noted.   LV perfusion is abnormal. There is no evidence of ischemia. There is evidence of infarction. Defect 1: There is a small defect with mild reduction in uptake present in the mid anterolateral location(s) that is fixed. There is normal wall motion in the defect area. Consistent with infarction.   Left ventricular function is normal. End diastolic cavity size is normal. End systolic cavity size is normal.   Prior study available for comparison from 06/04/2013. No changes compared to prior study.   No evidence of ischemia. Similar area of mild fixed decreased perfusion in the mid inferolateral wall, consistent with prior infarct. Normal wall motion.   Echo 12/15/2021  1. Left ventricular ejection fraction by 3D volume is 69 %. The left  ventricle has normal function. The left ventricle has no regional wall  motion abnormalities. Left ventricular diastolic parameters were normal.   2. Right ventricular systolic function is normal. The right ventricular  size is normal. There is normal pulmonary artery systolic pressure.   3. The mitral valve is normal in structure. Trivial mitral valve  regurgitation. No evidence of mitral stenosis.   4. The aortic valve is normal in structure. Aortic valve regurgitation is  not visualized. No aortic stenosis is present.   5. The inferior vena cava is normal in size with greater than 50%  respiratory variability, suggesting right atrial pressure of 3 mmHg.      EKG:  EKG is ordered today.  The ekg ordered today demonstrates normal sinus rhythm, no significant ST-T wave changes, first-degree AV block.  Recent Labs: No results found for requested labs within last 365 days.  Recent Lipid Panel    Component Value Date/Time   CHOL 176 01/02/2019 0227   TRIG 53 01/02/2019 0227   HDL 80 01/02/2019 0227   CHOLHDL 2.2 01/02/2019 0227   VLDL 11 01/02/2019 0227   LDLCALC 85 01/02/2019 0227      Risk Assessment/Calculations:           Physical Exam:    VS:  BP 110/60   Pulse (!) 57   Ht '5\' 7"'$  (1.702 m)   Wt 129 lb 6.4 oz (58.7 kg)   SpO2 98%   BMI 20.27 kg/m        Wt Readings from Last 3 Encounters:  07/25/22 129 lb 6.4 oz (58.7 kg)  05/31/22 129 lb 13.6 oz (58.9 kg)  05/27/22 130 lb (59 kg)     GEN:  Well nourished, well developed in no acute distress HEENT: Normal NECK: No JVD; No carotid bruits LYMPHATICS: No lymphadenopathy CARDIAC: RRR, no murmurs, rubs, gallops RESPIRATORY:  Clear to auscultation without rales, wheezing or rhonchi  ABDOMEN: Soft, non-tender, non-distended MUSCULOSKELETAL:  No edema; No deformity  SKIN: Warm and dry NEUROLOGIC:  Alert and oriented x 3 PSYCHIATRIC:  Normal affect   ASSESSMENT:    1. Dizziness   2. Coronary artery dissection    PLAN:    In order of problems listed above:  Dizziness: See orthostatic vital signs above, when she changed body position, heart rate increased by roughly 24 bpm, this is below the diagnostic criteria for POTS disorder.  Blood pressure did drop slightly as well however not diagnostic of orthostatic hypotension either.  I recommended she keep adequate hydration.  She was placed on low-dose Cardizem in the past for microvascular disease.  She had a negative Myoview in March.  Previous coronary CT in April 2020 showed a coronary calcium score of  0.  Anomalous left main coronary artery arising from the RCA in a retro-aortic course.  I recommended discontinuation of Cardizem yet help to improve her dizziness  History of coronary artery dissection: She had an MI in 2014 secondary to SCAD of a small OM2, this was treated medically.           Medication Adjustments/Labs and Tests Ordered: Current medicines are reviewed at length with the patient today.  Concerns regarding medicines are outlined above.  Orders Placed This Encounter  Procedures   EKG 12-Lead   No orders of the defined types  were placed in this encounter.   Patient Instructions  Medication Instructions:  STOP Diltiazem   *If you need a refill on your cardiac medications before your next appointment, please call your pharmacy*  Lab Work: NONE ordered at this time of appointment   If you have labs (blood work) drawn today and your tests are completely normal, you will receive your results only by: [place] (if you have MyChart) OR A paper copy in the mail If you have any lab test that is abnormal or we need to change your treatment, we will call you to review the results.  Testing/Procedures: NONE ordered at this time of appointment   Follow-Up: At [facility], you and your health needs are our priority.  As part of our continuing mission to provide you with exceptional heart care, we have created designated Provider Care Teams.  These Care Teams include your primary Cardiologist (physician) and Advanced Practice Providers (APPs -  Physician Assistants and Nurse Practitioners) who all work together to provide you with the care you need, when you need it.  Your next appointment:   As previously scheduled   The format for your next appointment:   In Person  Provider:   Donato Heinz, MD     Other Instructions  Important Information About Sugar         Signed, Almyra Deforest, [place]  07/27/2022 11:00 AM    [place]

## 2022-07-27 ENCOUNTER — Encounter: Payer: Self-pay | Admitting: Physician Assistant

## 2022-07-29 DIAGNOSIS — R7301 Impaired fasting glucose: Secondary | ICD-10-CM | POA: Diagnosis not present

## 2022-07-30 LAB — LAB REPORT - SCANNED
A1c: 5.8
EGFR: 79

## 2022-08-05 DIAGNOSIS — R7301 Impaired fasting glucose: Secondary | ICD-10-CM | POA: Diagnosis not present

## 2022-08-05 DIAGNOSIS — G43009 Migraine without aura, not intractable, without status migrainosus: Secondary | ICD-10-CM | POA: Diagnosis not present

## 2022-08-05 DIAGNOSIS — Z23 Encounter for immunization: Secondary | ICD-10-CM | POA: Diagnosis not present

## 2022-08-05 DIAGNOSIS — K219 Gastro-esophageal reflux disease without esophagitis: Secondary | ICD-10-CM | POA: Diagnosis not present

## 2022-08-05 DIAGNOSIS — M797 Fibromyalgia: Secondary | ICD-10-CM | POA: Diagnosis not present

## 2022-08-05 DIAGNOSIS — R634 Abnormal weight loss: Secondary | ICD-10-CM | POA: Diagnosis not present

## 2022-08-05 DIAGNOSIS — R251 Tremor, unspecified: Secondary | ICD-10-CM | POA: Diagnosis not present

## 2022-08-05 DIAGNOSIS — R1310 Dysphagia, unspecified: Secondary | ICD-10-CM | POA: Diagnosis not present

## 2022-08-05 DIAGNOSIS — F332 Major depressive disorder, recurrent severe without psychotic features: Secondary | ICD-10-CM | POA: Diagnosis not present

## 2022-08-08 ENCOUNTER — Other Ambulatory Visit (HOSPITAL_COMMUNITY): Payer: Self-pay

## 2022-08-08 ENCOUNTER — Ambulatory Visit (INDEPENDENT_AMBULATORY_CARE_PROVIDER_SITE_OTHER): Payer: Medicare PPO | Admitting: Gastroenterology

## 2022-08-08 ENCOUNTER — Encounter (INDEPENDENT_AMBULATORY_CARE_PROVIDER_SITE_OTHER): Payer: Self-pay | Admitting: Gastroenterology

## 2022-08-08 VITALS — BP 101/57 | HR 79 | Temp 97.5°F | Ht 67.0 in | Wt 129.8 lb

## 2022-08-08 DIAGNOSIS — R1310 Dysphagia, unspecified: Secondary | ICD-10-CM | POA: Diagnosis not present

## 2022-08-08 DIAGNOSIS — R634 Abnormal weight loss: Secondary | ICD-10-CM | POA: Diagnosis not present

## 2022-08-08 DIAGNOSIS — G90A Postural orthostatic tachycardia syndrome (POTS): Secondary | ICD-10-CM | POA: Insufficient documentation

## 2022-08-08 DIAGNOSIS — R1030 Lower abdominal pain, unspecified: Secondary | ICD-10-CM | POA: Diagnosis not present

## 2022-08-08 DIAGNOSIS — K219 Gastro-esophageal reflux disease without esophagitis: Secondary | ICD-10-CM

## 2022-08-08 DIAGNOSIS — G8929 Other chronic pain: Secondary | ICD-10-CM | POA: Insufficient documentation

## 2022-08-08 MED ORDER — OMEPRAZOLE 40 MG PO CPDR
40.0000 mg | DELAYED_RELEASE_CAPSULE | Freq: Every day | ORAL | 3 refills | Status: DC
  Filled 2022-08-08 – 2022-09-22 (×4): qty 90, 90d supply, fill #0
  Filled 2023-01-17: qty 90, 90d supply, fill #1
  Filled 2023-06-12: qty 90, 90d supply, fill #2

## 2022-08-08 MED ORDER — FAMOTIDINE 20 MG PO TABS
20.0000 mg | ORAL_TABLET | Freq: Every day | ORAL | 1 refills | Status: AC
  Filled 2022-08-08: qty 60, 60d supply, fill #0
  Filled 2022-10-24: qty 60, 60d supply, fill #1

## 2022-08-08 NOTE — Progress Notes (Addendum)
Referring Provider: Celene Squibb, MD Primary Care Physician:  Celene Squibb, MD Primary GI Physician: Jenetta Downer   Chief Complaint  Patient presents with   Weight Loss    Follow up on weight loss. Can only eat small amounts at a time. Keeps sore throat.    HPI:   Stacey Lawrence is a 71 y.o. female with past medical history of anemia, anxiety, CAD, fibromyalgia, GERD, IBS, previous MI, Raynaud's, scoliosis, POTS   Patient presenting today for follow up  Last seen in August with GERD, dysphagia and weight loss, at that time Patient reported 20 pounds of weight loss in the past 3 months.having some dysphagia as well. She has heartburn and acid regurgitation. She is taking omeprazole '10mg'$  otc meds about 5 days prior without any improvement. Choking even on water, dysphagia worsened about a month ago. She has decreased appetite and abdominal pain after eating. She has early satiety and eating more than a small bland meal will worsen her acid reflux. Having some nausea at times but no vomiting. Stools are darker but she does take PO iron. Having a lot of gas as well and belching as well. Drinks alcohol only on occasion. Takes advil very occasionally. Denies rectal bleeding.    Recommend proceeding with EGD, omeprazole '40mg'$  daily, boost/ensure protein shakes.   Present: Patient reports she continues to lose weight, she is still having some early satiety. Occasional nausea. She is down to 129 lbs, 133 lbs at last visit. She is doing 1 ensure per day. Has usually 2 meals per day, dinner is usually her heavier meal. She continues to have some mid to lower abdominal pain as well. This happens intermittently, sometimes occurs after eating. Denies diarrhea, rectal bleeding or melena. Had labs done with PCP, recently, unsure if thyroid panel was checked.   Has some issues still sometimes with dysphagia of liquids, she has no issues with dysphagia to solid foods or pills. Notes that she has a sore throat  at times as well. Has some hoarseness and clearing of the throat too. She is having heartburn episodes 2-3x/week, is trying to avoid trigger foods.   GES done 10/4 with accelerated gastric emptying, question hypermotility CT chest/abd/pelvis 10/14 that was overall normal, recommended to follow up with cardiology regarding possible POTS as contributing.   Saw cardiology earlier this month and cardizem was stopped, she has not noted any improvement since then.   Last Colonoscopy:08/11/21 - Diverticulosis in the sigmoid colon and in the transverse colon. - Two small polyps at the splenic flexure and in the transverse colon-2 tubular adenomas and one hyperplastic polyp.  - One 4 to 8 mm polyp at the splenic flexure, removed with a cold snare. Resected and retrieved. - External hemorrhoids. Last Endoscopy:05/31/22 No endoscopic esophageal abnormality to explain patient's dysphagia. Esophagus dilated. Dilated. - 2 cm hiatal hernia. - Gastroesophageal flap valve classified as Hill Grade IV (no fold, wide open lumen, hiatal hernia present). - Normal stomach. Biopsied. - Normal examined duodenum.  Recommendations:    Past Medical History:  Diagnosis Date   Anemia    hx   Anxiety    CAD (coronary artery disease)    a. 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 05/2013 Lexi CL: EF 77%, old small inferolat scar, no ischemia.   CHF (congestive heart failure) (HCC)    Common migraine with intractable migraine 66/29/4765   Complication of anesthesia    dificulty waking up   Depression    Fibromyalgia    GERD (gastroesophageal reflux disease)    hasn't started taking any meds   History of blood transfusion    67yr ago   History of kidney stones    Hx  of colonic polyps    IBS (irritable bowel syndrome)    constipation   Joint pain    Joint swelling    Migraine    "q other week" (02/04/2016)   [place]   Numbness    right leg   Polycythemia    PONV (postoperative nausea and vomiting)    Raynaud's disease    Sciatica of right side 04/21/2020   Scoliosis    Shortness of breath dyspnea    with exertion   SI (sacroiliac) joint dysfunction    right   Wears glasses     Past Surgical History:  Procedure Laterality Date   ABDOMINAL HYSTERECTOMY     partial   APPENDECTOMY     with explor. lap.   BIOPSY  05/31/2022   Procedure: BIOPSY;  Surgeon: CHarvel Quale MD;  [facility];  Service: Gastroenterology;;   CARDIAC CATHETERIZATION     CHOLECYSTECTOMY  10/28/2002   lap.   COLONOSCOPY     COLONOSCOPY WITH PROPOFOL N/A 08/11/2021   Procedure: COLONOSCOPY WITH PROPOFOL;  Surgeon: RRogene Houston MD;  [facility];  Service: Endoscopy;  Laterality: N/A;  8:30   DEBRIDEMENT TENNIS ELBOW     DIAGNOSTIC LAPAROSCOPY     DILATION AND CURETTAGE OF UTERUS     ELBOW SURGERY     golfer's elbow-bilateral   ESOPHAGEAL DILATION N/A 05/31/2022   Procedure: ESOPHAGEAL DILATION;  Surgeon: CHarvel Quale MD;  [facility];  Service: Gastroenterology;  Laterality: N/A;   ESOPHAGOGASTRODUODENOSCOPY  09/10/2012   Procedure: ESOPHAGOGASTRODUODENOSCOPY (EGD);  Surgeon: NRogene Houston MD;  [facility];  Service: Endoscopy;  Laterality: N/A;  730   ESOPHAGOGASTRODUODENOSCOPY (EGD) WITH PROPOFOL N/A 05/31/2022   Procedure: ESOPHAGOGASTRODUODENOSCOPY (EGD) WITH PROPOFOL;  Surgeon: CHarvel Quale MD;  [facility];  Service: Gastroenterology;  Laterality: N/A;  205 ASA 3   EXCISION/RELEASE BURSA HIP Left 02/04/2016   Procedure: LEFT HIP TROCHANTERIC BURSECTOMY;  Surgeon: CMcarthur Rossetti MD;  [place]  Service: [place]opedics;   Laterality: Left;   I & D EXTREMITY  02/25/2012   Procedure: IRRIGATION AND DEBRIDEMENT EXTREMITY;  Surgeon: WRoseanne Kaufman MD;  [place] [facility]  Service: Orthopedics;  Laterality: Right;  Irrigation and Debridement and Repair Right Thumb Nerve Laceration and Assoiated Structures    KNEE DEBRIDEMENT     right   LAPAROTOMY     LEFT HEART CATHETERIZATION WITH CORONARY ANGIOGRAM N/A 01/16/2013   Procedure: LEFT HEART CATHETERIZATION WITH CORONARY ANGIOGRAM;  Surgeon: DJolaine Artist MD;  [facility];  Service: Cardiovascular;  Laterality: N/A;   POLYPECTOMY  08/11/2021   Procedure: POLYPECTOMY;  Surgeon: RRogene Houston MD;  [facility];  Service: Endoscopy;;   SACROILIAC JOINT FUSION Right 05/02/2019   Procedure: SACROILIAC JOINT FUSION;  Surgeon: BMelina Schools MD;  [place]  Service: [place]opedics;  Laterality: Right;  SACROILIAC JOINT FUSION   STAPEDECTOMY  right   TONSILLECTOMY AND ADENOIDECTOMY     TRIGGER FINGER RELEASE  08/19/2011   Procedure: RELEASE TRIGGER FINGER/A-1 PULLEY;  Surgeon: Willa Frater III;  Location: [place];  Service: Orthopedics;  Laterality: Right;  right middle finger a-1 pulley release with tenosynovectomy   TRIGGER FINGER RELEASE     x 5   TROCHANTERIC BURSA EXCISION Right 02/04/2016   TUBAL LIGATION     TYMPANOPLASTY      Current Outpatient Medications  Medication Sig Dispense Refill   BIOTIN PO Take 1 tablet by mouth daily.     Calcium Citrate-Vitamin D (CALCITRATE/VITAMIN D PO) Take 1 tablet by mouth daily.     Cholecalciferol (VITAMIN D3) 50 MCG (2000 UT) TABS Take 2,000 Units by mouth daily.     COLLAGEN PO Take 1 tablet by mouth daily.     omeprazole (PRILOSEC) 40 MG capsule Take 1 capsule (40 mg total) by mouth daily. 60 capsule 1   Turmeric (QC TUMERIC COMPLEX PO) Take 1,500 mg by mouth daily.     No current facility-administered medications for this visit.    Allergies as of  08/08/2022 - Review Complete 08/08/2022  Allergen Reaction Noted   Dilaudid [hydromorphone hcl] Other (See Comments) 07/27/2011   Codeine Nausea And Vomiting and Rash 07/27/2011   Morphine and related Nausea And Vomiting 02/24/2012    Family History  Problem Relation Age of Onset   Cancer Mother        Deceased with lung CA   Heart failure Mother    Heart disease Mother    COPD Mother    Varicose Veins Mother    Depression Mother    Cancer Father        Deceased with lung CA   Heart disease Father    Varicose Veins Father    Depression Father    Depression Brother    Lung cancer Brother    ADD / ADHD Son    Parkinson's disease Sister    Other Sister        enlarged heart    Tremor Maternal Grandmother    Parkinson's disease Paternal Grandfather    Lung cancer Other        3 aunts, 2 uncles on both sides of family     Social History   Socioeconomic History   Marital status: Married    Spouse name: Not on file   Number of children: 2   Years of education: 14   Highest education level: Not on file  Occupational History   Not on file  Tobacco Use   Smoking status: Never    Passive exposure: Never   Smokeless tobacco: Never  Vaping Use   Vaping Use: Never used  Substance and Sexual Activity   Alcohol use: No    Alcohol/week: 0.0 standard drinks of alcohol   Drug use: No   Sexual activity: Not Currently    Birth control/protection: Surgical  Other Topics Concern   Not on file  Social History Narrative   Lives in [place] with husband and her mother in law lives with them   Grown children.     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    Caffeine use: seldom drinks green tea   Social Determinants of Health   Financial Resource Strain: Not on file  Food Insecurity: Not on file  Transportation Needs: Not on file  Physical Activity: Not on file  Stress: Not on  file  Social Connections: Not on file   Review of systems General:  negative for malaise, night sweats, fever, chills +weight loss  Neck: Negative for lumps, goiter, pain and significant neck swelling Resp: Negative for cough, wheezing, dyspnea at rest CV: Negative for chest pain, leg swelling, palpitations, orthopnea GI: denies melena, hematochezia, nausea, vomiting, diarrhea, constipation, odynophagia,  +occasional dysphagia to liquids +mid to lower abdominal pain, intermittently, +early satiety +weight loss MSK: Negative for joint pain or swelling, back pain, and muscle pain. Derm: Negative for itching or rash Psych: Denies depression, anxiety, memory loss, confusion. No homicidal or suicidal ideation.  Heme: Negative for prolonged bleeding, bruising easily, and swollen nodes. Endocrine: Negative for cold or heat intolerance, polyuria, polydipsia and goiter. Neuro: negative for tremor, gait imbalance, syncope and seizures. The remainder of the review of systems is noncontributory.  Physical Exam: BP (!) 101/57 (BP Location: Left Arm, Patient Position: Sitting, Cuff Size: Normal)   Pulse 79   Temp (!) 97.5 F (36.4 C) (Oral)   Ht '5\' 7"'$  (1.702 m)   Wt 129 lb 12.8 oz (58.9 kg)   BMI 20.33 kg/m  General:   Alert and oriented. No distress noted. Pleasant and cooperative.  Head:  Normocephalic and atraumatic. Eyes:  Conjuctiva clear without scleral icterus. Mouth:  Oral mucosa pink and moist. Good dentition. No lesions. Heart: Normal rate and rhythm, s1 and s2 heart sounds present.  Lungs: Clear lung sounds in all lobes. Respirations equal and unlabored. Abdomen:  +BS, soft, non-tender and non-distended. No rebound or guarding. No HSM or masses noted. Derm: No palmar erythema or jaundice Msk:  Symmetrical without gross deformities. Normal posture. Extremities:  Without edema. Neurologic:  Alert and  oriented x4 Psych:  Alert and cooperative. Normal mood and affect.  Invalid input(s): "6 MONTHS"   ASSESSMENT: MINEOLA DUAN is a 72 y.o. female  presenting today for follow up of GERD/dysphagia and weight loss  GERD/Dysphagia:has some occasional dysphagia with liquids, though none with solids or pills. Feels that GERD is somewhat better with change in PPI therapy at last visit to omeprazole '40mg'$  daily, though still has some breakthrough 2-3x/week with hoarseness and throat clearing. Will continue with daily PPI and had famotidine '20mg'$  QHS. Ultimately if symptoms persist, will need to further discuss ph Impedence testing/esophageal manometry.   Weight loss/early satiety: recent GES and CT chest/abd/pelvis, colonoscopy in nov 2022 and EGD in sept 2023 without any findings to explain weight loss or early satiety. She was recommended to see cardiology after CT chest/abd/pelvis for concerns that her POTS could be contributing, she had recent change in medications with the removal of diltiazem just recently, though she has not noted any changes thus far.   She had recent labs with PCP and was told they were all normal, though unsure if thyroid function was checked.  At this time weight loss and early satiety etiology is unclear. Will obtain labs for review, and check TSH if this was not done. She denies diarrhea or rectal bleeding, has some intermittent mid to lower abdominal pain, sometimes occurring after meals, but not always associated with eating, low suspicion for mesenteric ischemia as presentation is not typical, however, Will discuss case with Dr. Jenetta Downer for any other recommendations regarding evaluation of weight loss.    PLAN:  Continue omeprazole '40mg'$  daily  2. Obtain labs from PCP/Consider thyroid function if not done with PCP 3. Continue boost/ensure shakes BID 4. Start famotidine '20mg'$  QHS 5. Continue with elevated HOB and  other reflux precautions 6. Consider ph impedence testing/esophageal manometry if GERD/dysphagia continues  All questions were answered, patient verbalized understanding and is in agreement with plan as outlined  above.    Follow Up: 3 months   Stacey Lawrence L. Alver Sorrow, MSN, APRN, AGNP-C Adult-Gerontology Nurse Practitioner [facility]  I have reviewed the note and agree with the APP's assessment as described in this progress note  Etiology of persistent weight loss unclear, possibly related to increased peristasis with poor absorption but unclear. May consider check cortisol in AM, will request most recetn TSH.  Maylon Peppers, MD Gastroenterology and Hepatology [facility] Gastroenterology

## 2022-08-08 NOTE — Patient Instructions (Addendum)
Please continue omeprazole '40mg'$  daily and avoid trigger foods, will add famotidine '20mg'$  in the evenings to see if this helps with breakthrough reflux symptoms  Continue boost/ensure twice a day for added nutrients I will obtain labs from PCP to see if they checked thyroid function as this could be contributing to weight loss  Follow up 3 months

## 2022-08-16 DIAGNOSIS — M5441 Lumbago with sciatica, right side: Secondary | ICD-10-CM | POA: Diagnosis not present

## 2022-08-16 DIAGNOSIS — K219 Gastro-esophageal reflux disease without esophagitis: Secondary | ICD-10-CM | POA: Diagnosis not present

## 2022-08-16 DIAGNOSIS — R634 Abnormal weight loss: Secondary | ICD-10-CM | POA: Diagnosis not present

## 2022-08-24 ENCOUNTER — Ambulatory Visit: Payer: Medicare PPO | Admitting: Cardiology

## 2022-09-05 ENCOUNTER — Other Ambulatory Visit: Payer: Self-pay

## 2022-09-13 ENCOUNTER — Other Ambulatory Visit (HOSPITAL_COMMUNITY): Payer: Self-pay

## 2022-09-16 ENCOUNTER — Other Ambulatory Visit (HOSPITAL_COMMUNITY): Payer: Self-pay

## 2022-09-22 ENCOUNTER — Other Ambulatory Visit (HOSPITAL_COMMUNITY): Payer: Self-pay

## 2022-09-28 ENCOUNTER — Other Ambulatory Visit (HOSPITAL_COMMUNITY): Payer: Self-pay

## 2022-09-28 DIAGNOSIS — R0789 Other chest pain: Secondary | ICD-10-CM | POA: Diagnosis not present

## 2022-09-28 DIAGNOSIS — R051 Acute cough: Secondary | ICD-10-CM | POA: Diagnosis not present

## 2022-09-28 DIAGNOSIS — R059 Cough, unspecified: Secondary | ICD-10-CM | POA: Diagnosis not present

## 2022-09-28 DIAGNOSIS — R0989 Other specified symptoms and signs involving the circulatory and respiratory systems: Secondary | ICD-10-CM | POA: Diagnosis not present

## 2022-09-28 MED ORDER — PREDNISONE 5 MG (21) PO TBPK
ORAL_TABLET | ORAL | 0 refills | Status: DC
  Filled 2022-09-28: qty 21, 6d supply, fill #0

## 2022-09-28 MED ORDER — AZITHROMYCIN 250 MG PO TABS
ORAL_TABLET | ORAL | 0 refills | Status: DC
  Filled 2022-09-28: qty 6, 5d supply, fill #0

## 2022-09-29 ENCOUNTER — Other Ambulatory Visit: Payer: Self-pay

## 2022-10-03 ENCOUNTER — Other Ambulatory Visit (HOSPITAL_COMMUNITY): Payer: Self-pay

## 2022-10-10 ENCOUNTER — Ambulatory Visit: Payer: Medicare PPO | Admitting: Cardiology

## 2022-10-24 ENCOUNTER — Other Ambulatory Visit (HOSPITAL_COMMUNITY): Payer: Self-pay

## 2022-10-24 DIAGNOSIS — L298 Other pruritus: Secondary | ICD-10-CM | POA: Diagnosis not present

## 2022-10-24 DIAGNOSIS — L814 Other melanin hyperpigmentation: Secondary | ICD-10-CM | POA: Diagnosis not present

## 2022-10-24 DIAGNOSIS — L821 Other seborrheic keratosis: Secondary | ICD-10-CM | POA: Diagnosis not present

## 2022-10-24 DIAGNOSIS — D1801 Hemangioma of skin and subcutaneous tissue: Secondary | ICD-10-CM | POA: Diagnosis not present

## 2022-10-24 DIAGNOSIS — L728 Other follicular cysts of the skin and subcutaneous tissue: Secondary | ICD-10-CM | POA: Diagnosis not present

## 2022-10-24 DIAGNOSIS — L538 Other specified erythematous conditions: Secondary | ICD-10-CM | POA: Diagnosis not present

## 2022-10-25 ENCOUNTER — Other Ambulatory Visit (HOSPITAL_COMMUNITY): Payer: Self-pay

## 2022-10-31 ENCOUNTER — Encounter (INDEPENDENT_AMBULATORY_CARE_PROVIDER_SITE_OTHER): Payer: Self-pay | Admitting: Gastroenterology

## 2022-10-31 ENCOUNTER — Ambulatory Visit (INDEPENDENT_AMBULATORY_CARE_PROVIDER_SITE_OTHER): Payer: Medicare PPO | Admitting: Gastroenterology

## 2022-10-31 VITALS — BP 114/70 | HR 90 | Temp 97.8°F | Ht 67.5 in | Wt 132.0 lb

## 2022-10-31 DIAGNOSIS — G90A Postural orthostatic tachycardia syndrome (POTS): Secondary | ICD-10-CM | POA: Diagnosis not present

## 2022-10-31 DIAGNOSIS — K589 Irritable bowel syndrome without diarrhea: Secondary | ICD-10-CM

## 2022-10-31 DIAGNOSIS — R634 Abnormal weight loss: Secondary | ICD-10-CM

## 2022-10-31 NOTE — Patient Instructions (Addendum)
Increase protein shake to twice a day, take one at night Proceed with exercise plan

## 2022-10-31 NOTE — Progress Notes (Signed)
Stacey Lawrence, M.D. Gastroenterology & Hepatology [facility] [address] [place], [place] [address]  Primary Care Physician: Celene Squibb, MD [address] [address]  I will communicate my assessment and recommendations to the referring MD via EMR.  Problems: Weight loss IBS GERD  History of Present Illness: Stacey Lawrence is a 72 y.o. female with past medical history of anemia, anxiety, CAD, fibromyalgia, GERD, IBS, previous MI, Raynaud's, scoliosis, who presents for follow up of weight loss.  The patient was last seen on 08/08/2022. At that time, the patient was advised to continue omeprazole 40 mg every day and start famotidine at night.  TSH was requested from PCP's office.  She is eating as usual, same amount as before. The patient denies having any nausea, vomiting, fever, chills, hematochezia, melena, hematemesis, abdominal distention, abdominal pain, diarrhea, jaundice, pruritus. Her weight is 3 lb higher than before.  She is going to start an exercise program next week - [employer]. She is interested in building up muscle. Is taking a protein shake every morning.  Seen by cardiology on 07/25/2022 who did not consider treatment criteria for POTS given that her blood pressure did not present orthostatic changes.  Patient reports she had COVID and RSV during the last season so this may have also led to recurrent weight loss, as she lost 25 pounds during this time, but she gained weight afterwards.  Last Colonoscopy:08/11/21 - Diverticulosis in the sigmoid colon and in the transverse colon. - Two small polyps at the splenic flexure and in the transverse colon-2 tubular adenomas and one hyperplastic polyp.  - One 4 to 8 mm polyp at the splenic flexure, removed with a cold snare. Resected and retrieved. - External hemorrhoids.  Recommend to repeat colonoscopy in 5 years.  Last Endoscopy:05/31/22 No  endoscopic esophageal abnormality to explain patient's dysphagia. Esophagus dilated. Dilated. - 2 cm hiatal hernia. - Gastroesophageal flap valve classified as Hill Grade IV (no fold, wide open lumen, hiatal hernia present). - Normal stomach. Biopsied. - Normal examined duodenum.  Pathology showed chronic gastritis without H. pylori or dysplasia.  Past Medical History: Past Medical History:  Diagnosis Date   Anemia    hx   Anxiety    CAD (coronary artery disease)    a. 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 05/2013 Lexi CL: EF 77%, old small inferolat scar, no ischemia.   CHF (congestive heart failure) (HCC)    Common migraine with intractable migraine [id number]  Complication of anesthesia    dificulty waking up   Depression    Fibromyalgia    GERD (gastroesophageal reflux disease)    hasn't started taking any meds   History of blood transfusion    288yrago   History of kidney stones    Hx of colonic polyps    IBS (irritable bowel syndrome)    constipation   Joint pain    Joint swelling    Migraine    "q other week" (02/04/2016)   Myocardial infarction ([facility]   Numbness    right leg   Polycythemia    PONV (postoperative nausea and vomiting)    Raynaud's disease  Sciatica of right side 04/21/2020   Scoliosis    Shortness of breath dyspnea    with exertion   SI (sacroiliac) joint dysfunction    right   Wears glasses     Past Surgical History: Past Surgical History:  Procedure Laterality Date   ABDOMINAL HYSTERECTOMY     partial   APPENDECTOMY     with explor. lap.   BIOPSY  05/31/2022   Procedure: BIOPSY;  Surgeon: Harvel Quale, MD;  [facility];  Service: Gastroenterology;;   CARDIAC  CATHETERIZATION     CHOLECYSTECTOMY  10/28/2002   lap.   COLONOSCOPY     COLONOSCOPY WITH PROPOFOL N/A 08/11/2021   Procedure: COLONOSCOPY WITH PROPOFOL;  Surgeon: Rogene Houston, MD;  [facility];  Service: Endoscopy;  Laterality: N/A;  8:30   DEBRIDEMENT TENNIS ELBOW     DIAGNOSTIC LAPAROSCOPY     DILATION AND CURETTAGE OF UTERUS     ELBOW SURGERY     golfer's elbow-bilateral   ESOPHAGEAL DILATION N/A 05/31/2022   Procedure: ESOPHAGEAL DILATION;  Surgeon: Harvel Quale, MD;  [facility];  Service: Gastroenterology;  Laterality: N/A;   ESOPHAGOGASTRODUODENOSCOPY  09/10/2012   Procedure: ESOPHAGOGASTRODUODENOSCOPY (EGD);  Surgeon: Rogene Houston, MD;  [facility];  Service: Endoscopy;  Laterality: N/A;  730   ESOPHAGOGASTRODUODENOSCOPY (EGD) WITH PROPOFOL N/A 05/31/2022   Procedure: ESOPHAGOGASTRODUODENOSCOPY (EGD) WITH PROPOFOL;  Surgeon: Harvel Quale, MD;  [facility];  Service: Gastroenterology;  Laterality: N/A;  205 ASA 3   EXCISION/RELEASE BURSA HIP Left 02/04/2016   Procedure: LEFT HIP TROCHANTERIC BURSECTOMY;  Surgeon: Mcarthur Rossetti, MD;  Location: [facility];  Service: Orthopedics;  Laterality: Left;   I & D EXTREMITY  02/25/2012   Procedure: IRRIGATION AND DEBRIDEMENT EXTREMITY;  Surgeon: Roseanne Kaufman, MD;  Location: [facility];  Service: Orthopedics;  Laterality: Right;  Irrigation and Debridement and Repair Right Thumb Nerve Laceration and Assoiated Structures    KNEE DEBRIDEMENT     right   LAPAROTOMY     LEFT HEART CATHETERIZATION WITH CORONARY ANGIOGRAM N/A 01/16/2013   Procedure: LEFT HEART CATHETERIZATION WITH CORONARY ANGIOGRAM;  Surgeon: Jolaine Artist, MD;  Location: [facility] C[facility];  Service: Cardiovascular;  Laterality: N/A;   POLYPECTOMY  08/11/2021   Procedure: POLYPECTOMY;  Surgeon: Rogene Houston, MD;  [facility];  Service: Endoscopy;;   SACROILIAC JOINT FUSION Right  05/02/2019   Procedure: SACROILIAC JOINT FUSION;  Surgeon: Melina Schools, MD;  Location: [facility];  Service: Orthopedics;  Laterality: Right;  SACROILIAC JOINT FUSION   STAPEDECTOMY     right   TONSILLECTOMY AND ADENOIDECTOMY     TRIGGER FINGER RELEASE  08/19/2011   Procedure: RELEASE TRIGGER FINGER/A-1 PULLEY;  Surgeon: Willa Frater III;  Location: [facility];  Service: Orthopedics;  Laterality: Right;  right middle finger a-1 pulley release with tenosynovectomy   TRIGGER FINGER RELEASE     x 5   TROCHANTERIC BURSA EXCISION Right 02/04/2016   TUBAL LIGATION     TYMPANOPLASTY      Family History: Family History  Problem Relation Age of Onset   Cancer Mother        Deceased with lung CA   Heart failure Mother    Heart disease Mother    COPD Mother    Varicose Veins Mother    Depression Mother    Cancer Father        Deceased  with lung CA   Heart disease Father    Varicose Veins Father    Depression Father    Depression Brother    Lung cancer Brother    ADD / ADHD Son    Parkinson's disease Sister    Other Sister        enlarged heart    Tremor Maternal Grandmother    Parkinson's disease Paternal Grandfather    Lung cancer Other        3 aunts, 2 uncles on both sides of family     Social History: Social History   Tobacco Use  Smoking Status Never   Passive exposure: Never  Smokeless Tobacco Never   Social History   Substance and Sexual Activity  Alcohol Use No   Alcohol/week: 0.0 standard drinks of alcohol   Social History   Substance and Sexual Activity  Drug Use No    Allergies: Allergies  Allergen Reactions   Dilaudid [Hydromorphone Hcl] Other (See Comments)    Resp. Arrest Body drop in temperature   Codeine Nausea And Vomiting and Rash   Morphine And Related Nausea And Vomiting    Medications: Current Outpatient Medications  Medication Sig Dispense Refill   BIOTIN PO Take 1 tablet by mouth daily.     Calcium Citrate-Vitamin  D (CALCITRATE/VITAMIN D PO) Take 1 tablet by mouth daily.     Cholecalciferol (VITAMIN D3) 50 MCG (2000 UT) TABS Take 2,000 Units by mouth daily.     COLLAGEN PO Take 1 tablet by mouth daily.     famotidine (PEPCID) 20 MG tablet Take 1 tablet (20 mg total) by mouth at bedtime. 60 tablet 1   omeprazole (PRILOSEC) 40 MG capsule Take 1 capsule (40 mg total) by mouth daily. 90 capsule 3   Turmeric (QC TUMERIC COMPLEX PO) Take 1,500 mg by mouth daily.     No current facility-administered medications for this visit.    Review of Systems: GENERAL: negative for malaise, night sweats HEENT: No changes in hearing or vision, no nose bleeds or other nasal problems. NECK: Negative for lumps, goiter, pain and significant neck swelling RESPIRATORY: Negative for cough, wheezing CARDIOVASCULAR: Negative for chest pain, leg swelling, palpitations, orthopnea GI: SEE HPI MUSCULOSKELETAL: Negative for joint pain or swelling, back pain, and muscle pain. SKIN: Negative for lesions, rash PSYCH: Negative for sleep disturbance, mood disorder and recent psychosocial stressors. HEMATOLOGY Negative for prolonged bleeding, bruising easily, and swollen nodes. ENDOCRINE: Negative for cold or heat intolerance, polyuria, polydipsia and goiter. NEURO: negative for tremor, gait imbalance, syncope and seizures. The remainder of the review of systems is noncontributory.   Physical Exam: BP 114/70 (BP Location: Left Arm, Patient Position: Sitting, Cuff Size: Large)   Pulse 90   Temp 97.8 F (36.6 C) (Core)   Ht 5' 7.5" (1.715 m)   Wt 132 lb (59.9 kg)   BMI 20.37 kg/m  GENERAL: The patient is AO x3, in no acute distress. HEENT: Head is normocephalic and atraumatic. EOMI are intact. Mouth is well hydrated and without lesions. NECK: Supple. No masses LUNGS: Clear to auscultation. No presence of rhonchi/wheezing/rales. Adequate chest expansion HEART: RRR, normal s1 and s2. ABDOMEN: Soft, nontender, no guarding, no  peritoneal signs, and nondistended. BS +. No masses. EXTREMITIES: Without any cyanosis, clubbing, rash, lesions or edema. NEUROLOGIC: AOx3, no focal motor deficit. SKIN: no jaundice, no rashes  Imaging/Labs: as above  I personally reviewed and interpreted the available labs, imaging and endoscopic files.  Impression and Plan: Jene  Bernardo Heater is a 72 y.o. female with past medical history of anemia, anxiety, CAD, fibromyalgia, GERD, IBS, previous MI, Raynaud's, scoliosis, who presents for follow up of weight loss.  The patient presented some significant weight loss in the past which was unintentional.  Due to this, she underwent endoscopic and cross-sectional chest and abdominopelvic imaging, which were unremarkable and negative for any malignancy.  Since the last time she was seen in our office, she has slowly gaining weight.  I emphasized about the importance of trying to adhere to high-protein diet and to increase her protein intake to avoid sarcopenia, but fortunately, no organic etiology has been found for her weight loss.  - Increase protein shake to twice a day, take one at night - Proceed with exercise plan  All questions were answered.      Stacey Peppers, MD Gastroenterology and Hepatology [facility]

## 2022-11-14 DIAGNOSIS — M5441 Lumbago with sciatica, right side: Secondary | ICD-10-CM | POA: Diagnosis not present

## 2022-11-23 ENCOUNTER — Telehealth (INDEPENDENT_AMBULATORY_CARE_PROVIDER_SITE_OTHER): Payer: Self-pay

## 2022-11-23 NOTE — Telephone Encounter (Signed)
Patient seen in February was told to have her thyroid checked when she has her physical. She says this is not schedule for another three months, she would like to know if you could go ahead and order this as she continues to loose weight and she is not getting any better. She says she has been taking her Prevacid 40 mg once per day and famotidine 20 Qhs.She says she was also asked by her Heart doctor to get a environmental testing done, to see if she has developed any illnesses from her using round up/ grass killer she says she has had exposure to this most of her life also pesticides [place]. I advised that she should have the doctor whom recommended that testing to order it as he would know the name of the tests to order. She was fine with this,but did want to proceed with you ordering the thyroid tests. Please advise.

## 2022-11-23 NOTE — Telephone Encounter (Signed)
Please send her an order for TSH and free T4. Thanks

## 2022-11-24 ENCOUNTER — Other Ambulatory Visit (INDEPENDENT_AMBULATORY_CARE_PROVIDER_SITE_OTHER): Payer: Self-pay

## 2022-11-24 DIAGNOSIS — M5451 Vertebrogenic low back pain: Secondary | ICD-10-CM | POA: Diagnosis not present

## 2022-11-24 DIAGNOSIS — R634 Abnormal weight loss: Secondary | ICD-10-CM

## 2022-11-24 DIAGNOSIS — M6281 Muscle weakness (generalized): Secondary | ICD-10-CM

## 2022-11-24 NOTE — Telephone Encounter (Signed)
Orders placed in Epic for [place] patient made aware.

## 2022-11-28 ENCOUNTER — Other Ambulatory Visit (HOSPITAL_COMMUNITY): Payer: Self-pay

## 2022-11-28 ENCOUNTER — Encounter: Payer: Self-pay | Admitting: Podiatry

## 2022-11-28 ENCOUNTER — Ambulatory Visit: Payer: Medicare PPO | Admitting: Podiatry

## 2022-11-28 DIAGNOSIS — B353 Tinea pedis: Secondary | ICD-10-CM | POA: Diagnosis not present

## 2022-11-28 MED ORDER — KETOCONAZOLE 2 % EX CREA
1.0000 | TOPICAL_CREAM | Freq: Every day | CUTANEOUS | 2 refills | Status: DC
  Filled 2022-11-28: qty 60, 30d supply, fill #0

## 2022-11-28 NOTE — Progress Notes (Signed)
  Subjective:  Patient ID: Stacey Lawrence, female    DOB: [birth year],   MRN: [id number]  Chief Complaint  Patient presents with   Nail Problem    Pt stated that her nails have turned yellow and her feet are itching all over and in between the toes     72 y.o. female presents for concern of nail and foot fungus. Relates her nails have turned yellow and her toes are very itchy. This has been going on for about a month. Relates she has tried some OTC treatments with no relief. She is a retired Haematologist.  . Denies any other pedal complaints. Denies n/v/f/c.   Past Medical History:  Diagnosis Date   Anemia    hx   Anxiety    CAD (coronary artery disease)    a. 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 05/2013 [facility] CL: EF 77%, old small inferolat scar, no ischemia.   CHF (congestive heart failure) (HCC)    Common migraine with intractable migraine 25/85/2778   Complication of anesthesia    dificulty waking up   Depression    Fibromyalgia    GERD (gastroesophageal reflux disease)    hasn't started taking any meds   History of blood transfusion    68yrs ago   History of kidney stones    Hx of colonic polyps    IBS (irritable bowel syndrome)    constipation   Joint pain    Joint swelling    Migraine    "q other week" (02/04/2016)   Myocardial infarction ([place]) 2014   Numbness    right leg   Polycythemia    PONV (postoperative nausea and vomiting)    Raynaud's disease    Sciatica of right side 04/21/2020   Scoliosis    Shortness of breath dyspnea    with exertion   SI (sacroiliac) joint dysfunction    right   Wears glasses     Objective:  Physical Exam: Vascular: DP/PT pulses 2/4 bilateral. CFT <3 seconds. Normal hair growth on  digits. No edema.  Skin. No lacerations or abrasions bilateral feet. Mild scaling noted to the platnar ball of the foot bilateral more so on the left. Some erythema noted to dorsum of foot just proximal to digits bilateral.  Musculoskeletal: MMT 5/5 bilateral lower extremities in DF, PF, Inversion and Eversion. Deceased ROM in DF of ankle joint.  Neurological: Sensation intact to light touch.   Assessment:   1. Tinea pedis of both feet      Plan:  Patient was evaluated and treated and all questions answered. -Examined patient -Discussed treatment options for tinea pedis.  -Ketoconazole provided.  -Patient to return in 3 weeks for follow up evaluation and discussion of fungal culture results or sooner if symptoms worsen.   Lorenda Peck, DPM

## 2022-12-07 DIAGNOSIS — M6281 Muscle weakness (generalized): Secondary | ICD-10-CM | POA: Diagnosis not present

## 2022-12-07 DIAGNOSIS — R634 Abnormal weight loss: Secondary | ICD-10-CM | POA: Diagnosis not present

## 2022-12-08 DIAGNOSIS — M5416 Radiculopathy, lumbar region: Secondary | ICD-10-CM | POA: Diagnosis not present

## 2022-12-08 LAB — TSH: TSH: 3.65 u[IU]/mL (ref 0.450–4.500)

## 2022-12-08 LAB — T4: T4, Total: 6.6 ug/dL (ref 4.5–12.0)

## 2022-12-13 ENCOUNTER — Telehealth (INDEPENDENT_AMBULATORY_CARE_PROVIDER_SITE_OTHER): Payer: Self-pay

## 2022-12-13 NOTE — Telephone Encounter (Signed)
Patient called the office says she has had some LLQ pain ongoing for the last two days, and it is not getting any better. She denies any fever, nausea, vomiting, dark or bloody stools has not had any issues with constipation nor diarrhea. She reports she has ate a whole bottle of Tums in the last two days. She says the pain awakens her in the night and she is miserable. Patient is taking her Famotidine 20 mg Qhs and her Omeprazole 40 mg once per day. She uses [facility]. Please advise.

## 2022-12-14 ENCOUNTER — Other Ambulatory Visit (HOSPITAL_COMMUNITY): Payer: Self-pay

## 2022-12-14 ENCOUNTER — Other Ambulatory Visit (INDEPENDENT_AMBULATORY_CARE_PROVIDER_SITE_OTHER): Payer: Self-pay | Admitting: Gastroenterology

## 2022-12-14 DIAGNOSIS — R109 Unspecified abdominal pain: Secondary | ICD-10-CM

## 2022-12-14 DIAGNOSIS — R634 Abnormal weight loss: Secondary | ICD-10-CM

## 2022-12-14 MED ORDER — MIRTAZAPINE 15 MG PO TBDP
15.0000 mg | ORAL_TABLET | Freq: Every day | ORAL | 1 refills | Status: DC
  Filled 2022-12-14: qty 90, 90d supply, fill #0

## 2022-12-14 NOTE — Telephone Encounter (Signed)
Spoke to patient today regarding her recurrent abdominal pain and recurrent weight loss.  She has had negative endoscopic and cross-sectional abdominal imaging investigations.  Because of the abdominal pain and poor appetite, she has not presented any other symptoms.  We discussed in detail that the symptoms could be related to functional abdominal pain, for which we will start her on Remeron.  She is agreeable to proceed with this.  Mitzie, can you please schedule a follow up appointment for this patient in 6-8 weeks with me or any of the APPs?  Thanks,  Maylon Peppers, MD Gastroenterology and Hepatology [facility]

## 2022-12-14 NOTE — Telephone Encounter (Signed)
Noted  

## 2022-12-15 ENCOUNTER — Other Ambulatory Visit (HOSPITAL_COMMUNITY): Payer: Self-pay

## 2022-12-15 DIAGNOSIS — M5416 Radiculopathy, lumbar region: Secondary | ICD-10-CM | POA: Diagnosis not present

## 2022-12-19 ENCOUNTER — Other Ambulatory Visit (HOSPITAL_COMMUNITY): Payer: Self-pay

## 2022-12-19 ENCOUNTER — Ambulatory Visit: Payer: Medicare PPO | Admitting: Podiatry

## 2022-12-19 ENCOUNTER — Encounter: Payer: Self-pay | Admitting: Podiatry

## 2022-12-19 DIAGNOSIS — B353 Tinea pedis: Secondary | ICD-10-CM

## 2022-12-19 DIAGNOSIS — L309 Dermatitis, unspecified: Secondary | ICD-10-CM | POA: Diagnosis not present

## 2022-12-19 MED ORDER — CLOTRIMAZOLE-BETAMETHASONE 1-0.05 % EX CREA
1.0000 | TOPICAL_CREAM | Freq: Every day | CUTANEOUS | 0 refills | Status: DC
  Filled 2022-12-19: qty 30, 30d supply, fill #0

## 2022-12-19 NOTE — Progress Notes (Signed)
  Subjective:  Patient ID: Stacey Lawrence, female    DOB: [birth year],   MRN: [id number]  Chief Complaint  Patient presents with   Tinea Pedis    Patient is here today for her follow up visit for Tinea pedis of both feet.    72 y.o. female presents for follow-up of nail and foot fungus. Relates the cream has not made any difference. Relates it is worse when in shoes and feet get hot.  She is a retired Haematologist.  . Denies any other pedal complaints. Denies n/v/f/c.   Past Medical History:  Diagnosis Date   Anemia    hx   Anxiety    CAD (coronary artery disease)    a. 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 05/2013 [facility] CL: EF 77%, old small inferolat scar, no ischemia.   CHF (congestive heart failure)    Common migraine with intractable migraine [id number]   Complication of anesthesia    dificulty waking up   Depression    Fibromyalgia    GERD (gastroesophageal reflux disease)    hasn't started taking any meds   History of blood transfusion    52yrs ago   History of kidney stones    Hx of colonic polyps    IBS (irritable bowel syndrome)    constipation   Joint pain    Joint swelling    Migraine    "q other week" (02/04/2016)   Myocardial infarction 2014   Numbness    right leg   Polycythemia    PONV (postoperative nausea and vomiting)    Raynaud's disease    Sciatica of right side 04/21/2020   Scoliosis    Shortness of breath dyspnea    with exertion   SI (sacroiliac) joint dysfunction    right   Wears glasses     Objective:  Physical Exam: Vascular: DP/PT pulses 2/4 bilateral. CFT <3 seconds. Normal hair growth on digits. No edema.  Skin. No lacerations or abrasions bilateral feet. Mild scaling noted to the platnar ball  of the foot bilateral more so on the left. Some erythema noted to dorsum of foot just proximal to digits bilateral.  Musculoskeletal: MMT 5/5 bilateral lower extremities in DF, PF, Inversion and Eversion. Deceased ROM in DF of ankle joint.  Neurological: Sensation intact to light touch.   Assessment:   1. Tinea pedis of both feet   2. Dermatitis       Plan:  Patient was evaluated and treated and all questions answered. -Examined patient -Discussed treatment options for tinea pedis.  .-Will try lotrisone  to see if this will be more effective and cover if this is more of a dermatitis heat rash.  -Patient to return in 3 weeks for follow up evaluation   Lorenda Peck, DPM

## 2022-12-22 DIAGNOSIS — M5416 Radiculopathy, lumbar region: Secondary | ICD-10-CM | POA: Diagnosis not present

## 2022-12-25 NOTE — Progress Notes (Unsigned)
Cardiology Office Note:    Date:  03/16/2022   ID:  Dahja, Spadea 19-Sep-1951, MRN [id number]  PCP:  Benita Stabile, MD  Cardiologist:  Little Ishikawa, MD  Electrophysiologist:  None   Referring MD: Benita Stabile, MD   Chief Complaint  Patient presents with   Chest Pain    History of Present Illness:    Stacey Lawrence is a 72 y.o. female with a hx of SCAD, anomalous left main coronary artery, fibromyalgia, Raynaud's, GERD who presents for follow-up.  She previously followed with Dr. Purvis Sheffield.  She had an MI in 2014 secondary to SCAD of a small OM 2 branch, treated medically.  Also noted to have mid LAD lesion likely related to myocardial bridging.  Coronary CTA 01/02/2019 showed calcium score 0, anomalous left main coronary arising from right coronary artery with retroaortic course.  She has been on Cardizem for suspected microvascular disease.  Had episode of chest pain and exercise Myoview was done on 11/30/2021, which showed good exercise capacity (10.1 METS), no ST deviation, likely normal perfusion (fixed mid inferolateral perfusion defect with normal wall motion suggests artifact).  Calcium score on 12/15/2021 was 0.  Echocardiogram on 12/15/2021 showed normal biventricular function, no significant valvular disease.  Zio patch x 14 days 02/2022 showed 1 episode of NSVT lasting 4 beats.  Since last clinic visit,  she reports that she is doing okay.  Continues to report occasional chest pain that she describes as tightness in chest.  Denies any dyspnea.  Reports lightheadedness but denies any syncope.  Does report she has been having some lower extremity edema.  Reports has been having palpitations 1-2 times per months but she feels like her heart is racing and then goes slow.  Last for 2 to 3 minutes and resolves.   Past Medical History:  Diagnosis Date   Anemia    hx   Anxiety    CAD (coronary artery disease)    a. 12/2012 NSTEMI/Cath: LM anomalous, arising in R cusp  ant to RCA, otw nl, LAD 1m with bridge, RI nl, LCX nl, OM2 small, subtl occl w/ thrombus distally - felt to be spont dissection, too small for PCI->Med Rx, RCA large, dom, nl, PDA/PD nl, EF 55% w/ focal HK in mid-dist antlat wall;  b. 05/2013 Lexi CL: EF 77%, old small inferolat scar, no ischemia.   Common migraine with intractable migraine 06/16/2017   Depression    Fibromyalgia    GERD (gastroesophageal reflux disease)    hasn't started taking any meds   History of blood transfusion    58yrs ago   History of kidney stones    Hx of colonic polyps    IBS (irritable bowel syndrome)    constipation   Joint pain    Joint swelling    Migraine    "q other week" (02/04/2016)   Myocardial infarction (HCC) 2014   Numbness    right leg   Polycythemia    PONV (postoperative nausea and vomiting)    Raynaud's disease    Sciatica of right side 04/21/2020   Scoliosis    Shortness of breath dyspnea    with exertion   SI (sacroiliac) joint dysfunction    right   Wears glasses     Past Surgical History:  Procedure Laterality Date   ABDOMINAL HYSTERECTOMY     partial   APPENDECTOMY     with explor. lap.   CARDIAC CATHETERIZATION     CHOLECYSTECTOMY  10/28/2002   lap.   COLONOSCOPY     COLONOSCOPY WITH PROPOFOL N/A 08/11/2021   Procedure: COLONOSCOPY WITH PROPOFOL;  Surgeon: Malissa Hippo, MD;  Location: [facility];  Service: Endoscopy;  Laterality: N/A;  8:30   DEBRIDEMENT TENNIS ELBOW     DIAGNOSTIC LAPAROSCOPY     DILATION AND CURETTAGE OF UTERUS     ELBOW SURGERY     golfer's elbow-bilateral   ESOPHAGOGASTRODUODENOSCOPY  09/10/2012   Procedure: ESOPHAGOGASTRODUODENOSCOPY (EGD);  Surgeon: Malissa Hippo, MD;  Location: [facility];  Service: Endoscopy;  Laterality: N/A;  730   EXCISION/RELEASE BURSA HIP Left 02/04/2016   Procedure: LEFT HIP TROCHANTERIC BURSECTOMY;  Surgeon: Kathryne Hitch, MD;  Location: [facility] OR;  Service: Orthopedics;  Laterality: Left;   I & D  EXTREMITY  02/25/2012   Procedure: IRRIGATION AND DEBRIDEMENT EXTREMITY;  Surgeon: Dominica Severin, MD;  Location: [facility] OR;  Service: Orthopedics;  Laterality: Right;  Irrigation and Debridement and Repair Right Thumb Nerve Laceration and Assoiated Structures    KNEE DEBRIDEMENT     right   LAPAROTOMY     LEFT HEART CATHETERIZATION WITH CORONARY ANGIOGRAM N/A 01/16/2013   Procedure: LEFT HEART CATHETERIZATION WITH CORONARY ANGIOGRAM;  Surgeon: Dolores Patty, MD;  Location: [facility] CATH LAB;  Service: Cardiovascular;  Laterality: N/A;   POLYPECTOMY  08/11/2021   Procedure: POLYPECTOMY;  Surgeon: Malissa Hippo, MD;  Location: [facility];  Service: Endoscopy;;   SACROILIAC JOINT FUSION Right 05/02/2019   Procedure: SACROILIAC JOINT FUSION;  Surgeon: Venita Lick, MD;  Location: [facility] OR;  Service: Orthopedics;  Laterality: Right;  SACROILIAC JOINT FUSION   STAPEDECTOMY     right   TONSILLECTOMY AND ADENOIDECTOMY     TRIGGER FINGER RELEASE  08/19/2011   Procedure: RELEASE TRIGGER FINGER/A-1 PULLEY;  Surgeon: Oletta Cohn III;  Location: [facility];  Service: Orthopedics;  Laterality: Right;  right middle finger a-1 pulley release with tenosynovectomy   TROCHANTERIC BURSA EXCISION Right 02/04/2016   TUBAL LIGATION     TYMPANOPLASTY      Current Medications: Current Meds  Medication Sig   BIOTIN PO biotin   Calcium Citrate-Vitamin D (CALCITRATE/VITAMIN D PO) Take 1 tablet by mouth daily.   Cholecalciferol (VITAMIN D3) 50 MCG (2000 UT) TABS Take by mouth.   COLLAGEN PO Take by mouth.   dicyclomine (BENTYL) 10 MG capsule Take 1 capsule by mouth 2 (two) times daily before a meal.   diltiazem (CARDIZEM CD) 120 MG 24 hr capsule TAKE 1 CAPSULE BY MOUTH ONCE A DAY   ferrous sulfate 324 MG TBEC Take 324 mg by mouth daily with breakfast.   promethazine (PHENERGAN) 25 MG tablet Take 25 mg by mouth every 6 (six) hours as needed for nausea or vomiting.   Turmeric (QC TUMERIC COMPLEX PO)  Take 1,500 mg by mouth daily.     Allergies:   Dilaudid [hydromorphone hcl], Codeine, and Morphine and related   Social History   Socioeconomic History   Marital status: Married    Spouse name: Not on file   Number of children: 2   Years of education: 14   Highest education level: Not on file  Occupational History   Not on file  Tobacco Use   Smoking status: Never   Smokeless tobacco: Never  Vaping Use   Vaping Use: Never used  Substance and Sexual Activity   Alcohol use: No    Alcohol/week: 0.0 standard drinks of alcohol  Drug use: No   Sexual activity: Not Currently    Birth control/protection: Surgical  Other Topics Concern   Not on file  Social History Narrative   Lives in [place] with husband and her mother in law lives with them   Grown children.     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    Caffeine use: seldom drinks green tea   Social Determinants of Corporate investment banker Strain: Not on file  Food Insecurity: Not on file  Transportation Needs: Not on file  Physical Activity: Not on file  Stress: Not on file  Social Connections: Not on file     Family History: The patient's family history includes ADD / ADHD in her son; COPD in her mother; Cancer in her father and mother; Depression in her brother, father, and mother; Heart disease in her father and mother; Heart failure in her mother; Lung cancer in her brother and another family member; Other in her sister; Parkinson's disease in her paternal grandfather and sister; Tremor in her maternal grandmother; Varicose Veins in her father and mother.  ROS:   Please see the history of present illness.     All other systems reviewed and are negative.  EKGs/Labs/Other Studies Reviewed:    The following studies were reviewed today:   EKG:   11/25/21: Sinus rhythm, first-degree AV block, incomplete right bundle branch block, rate 71  Recent Labs: No results found for  requested labs within last 365 days.  Recent Lipid Panel    Component Value Date/Time   CHOL 176 01/02/2019 0227   TRIG 53 01/02/2019 0227   HDL 80 01/02/2019 0227   CHOLHDL 2.2 01/02/2019 0227   VLDL 11 01/02/2019 0227   LDLCALC 85 01/02/2019 0227    Physical Exam:    VS:  BP 110/60   Pulse 75   Ht 5\' 7"  (1.702 m)   Wt 138 lb (62.6 kg)   SpO2 97%   BMI 21.61 kg/m     Wt Readings from Last 3 Encounters:  03/16/22 138 lb (62.6 kg)  11/30/21 141 lb (64 kg)  11/25/21 141 lb 12.8 oz (64.3 kg)     GEN:  Well nourished, well developed in no acute distress HEENT: Normal NECK: No JVD; No carotid bruits LYMPHATICS: No lymphadenopathy CARDIAC: RRR, no murmurs, rubs, gallops RESPIRATORY:  Clear to auscultation without rales, wheezing or rhonchi  ABDOMEN: Soft, non-tender, non-distended MUSCULOSKELETAL:  No edema; No deformity  SKIN: Warm and dry NEUROLOGIC:  Alert and oriented x 3 PSYCHIATRIC:  Normal affect   ASSESSMENT:    1. Palpitations   2. Chest pain of uncertain etiology   3. Hyperlipidemia, unspecified hyperlipidemia type   4. Anomalous left coronary artery   5. Coronary artery dissection     PLAN:    Chest pain: Description concerning for angina, as describes episode of exertional chest heaviness.  Coronary CTA in 2020 showed calcium score 0 with no CAD, but had LAD myocardial bridge and anomalous left main off RCA with retroaortic course.  Had episode of chest pain and exercise Myoview was done on 11/30/2021, which showed good exercise capacity (10.1 METS), no ST deviation, likely normal perfusion (fixed mid inferolateral perfusion defect with normal wall motion suggests artifact).  Calcium score on 12/15/2021 was 0.  Echocardiogram on 12/15/2021 showed normal biventricular function, no significant valvular disease. -***She has been on diltiazem for suspected microvascular dysfunction, can continue.  SCAD: had an MI  in 2014 secondary to SCAD of a small OM 2 branch,  treated medically.   Hyperlipidemia: LDL 111 on 07/30/2021.  Calcium score 0 on 12/15/2021  Palpitations: Zio patch x 14 days 02/2022 showed 1 episode of NSVT lasting 4 beats.  RTC in 6 months   Medication Adjustments/Labs and Tests Ordered: Current medicines are reviewed at length with the patient today.  Concerns regarding medicines are outlined above.  Orders Placed This Encounter  Procedures   LONG TERM MONITOR (3-14 DAYS)   No orders of the defined types were placed in this encounter.   Patient Instructions  Medication Instructions:  Your physician recommends that you continue on your current medications as directed. Please refer to the Current Medication list given to you today.  *If you need a refill on your cardiac medications before your next appointment, please call your pharmacy*  Testing/Procedures: ZIO XT- Long Term Monitor Instructions   Your physician has requested you wear a ZIO patch monitor for _14_ days.  This is a single patch monitor.   [employer] supplies one patch monitor per enrollment. Additional stickers are not available. Please do not apply patch if you will be having a Nuclear Stress Test, Echocardiogram, Cardiac CT, MRI, or Chest Xray during the period you would be wearing the monitor. The patch cannot be worn during these tests. You cannot remove and re-apply the ZIO XT patch monitor.  Your ZIO patch monitor will be sent Fed Ex from [employer] directly to your home address. It may take 3-5 days to receive your monitor after you have been enrolled.  Once you have received your monitor, please review the enclosed instructions. Your monitor has already been registered assigning a specific monitor serial # to you.  Billing and Patient Assistance Program Information   We have supplied [employer] with any of your insurance information on file for billing purposes. [employer] offers a sliding scale Patient Assistance Program for patients that do not have  insurance, or whose insurance does not completely cover the cost of the ZIO monitor.   You must apply for the Patient Assistance Program to qualify for this discounted rate.     To apply, please call [employer] at [phone], select option 4, then select option 2, and ask to apply for Patient Assistance Program.  [employer] will ask your household income, and how many people are in your household.  They will quote your out-of-pocket cost based on that information.  [employer] will also be able to set up a 1353-month, interest-free payment plan if needed.  Applying the monitor   Shave hair from upper left chest.  Hold abrader disc by orange tab. Rub abrader in 40 strokes over the upper left chest as indicated in your monitor instructions.  Clean area with 4 enclosed alcohol pads. Let dry.  Apply patch as indicated in monitor instructions. Patch will be placed under collarbone on left side of chest with arrow pointing upward.  Rub patch adhesive wings for 2 minutes. Remove white label marked "1". Remove the white label marked "2". Rub patch adhesive wings for 2 additional minutes.  While looking in a mirror, press and release button in center of patch. A small green light will flash 3-4 times. This will be your only indicator that the monitor has been turned on. ?  Do not shower for the first 24 hours. You may shower after the first 24 hours.  Press the button if you feel a symptom. You will hear a small click. Record Date,  Time and Symptom in the Patient Logbook.  When you are ready to remove the patch, follow instructions on the last 2 pages of the Patient Logbook. Stick patch monitor onto the last page of Patient Logbook.  Place Patient Logbook in the blue and white box.  Use locking tab on box and tape box closed securely.  The blue and white box has prepaid postage on it. Please place it in the mailbox as soon as possible. Your physician should have your test results approximately 7 days after the monitor has  been mailed back to [facility].  Call [facility] Customer Care at [phone] if you have questions regarding your ZIO XT patch monitor. Call them immediately if you see an orange light blinking on your monitor.  If your monitor falls off in less than 4 days, contact our Monitor department at [phone]. ?If your monitor becomes loose or falls off after 4 days call [employer] at [phone] for suggestions on securing your monitor.?  Follow-Up: At [facility], you and your health needs are our priority.  As part of our continuing mission to provide you with exceptional heart care, we have created designated Provider Care Teams.  These Care Teams include your primary Cardiologist (physician) and Advanced Practice Providers (APPs -  Physician Assistants and Nurse Practitioners) who all work together to provide you with the care you need, when you need it.  We recommend signing up for the patient portal called "MyChart".  Sign up information is provided on this After Visit Summary.  MyChart is used to connect with patients for Virtual Visits (Telemedicine).  Patients are able to view lab/test results, encounter notes, upcoming appointments, etc.  Non-urgent messages can be sent to your provider as well.   To learn more about what you can do with MyChart, go to [email or web address].    Your next appointment:   6 month(s)  The format for your next appointment:   In Person  Provider:   Little Ishikawa, MD {    Important Information About Sugar         Signed, Little Ishikawa, MD  03/16/2022 9:32 AM    [facility]

## 2022-12-27 DIAGNOSIS — M5416 Radiculopathy, lumbar region: Secondary | ICD-10-CM | POA: Diagnosis not present

## 2022-12-28 ENCOUNTER — Encounter: Payer: Self-pay | Admitting: Cardiology

## 2022-12-28 ENCOUNTER — Ambulatory Visit: Payer: Medicare PPO | Attending: Cardiology | Admitting: Cardiology

## 2022-12-28 VITALS — BP 112/62 | HR 72 | Ht 67.0 in | Wt 141.8 lb

## 2022-12-28 DIAGNOSIS — R079 Chest pain, unspecified: Secondary | ICD-10-CM | POA: Diagnosis not present

## 2022-12-28 DIAGNOSIS — E785 Hyperlipidemia, unspecified: Secondary | ICD-10-CM | POA: Diagnosis not present

## 2022-12-28 DIAGNOSIS — I2542 Coronary artery dissection: Secondary | ICD-10-CM | POA: Diagnosis not present

## 2022-12-28 DIAGNOSIS — R002 Palpitations: Secondary | ICD-10-CM

## 2022-12-28 NOTE — Patient Instructions (Signed)
Medication Instructions:  Your physician recommends that you continue on your current medications as directed. Please refer to the Current Medication list given to you today.  *If you need a refill on your cardiac medications before your next appointment, please call your pharmacy*  Follow-Up: At [facility], you and your health needs are our priority.  As part of our continuing mission to provide you with exceptional heart care, we have created designated Provider Care Teams.  These Care Teams include your primary Cardiologist (physician) and Advanced Practice Providers (APPs -  Physician Assistants and Nurse Practitioners) who all work together to provide you with the care you need, when you need it.  We recommend signing up for the patient portal called "MyChart".  Sign up information is provided on this After Visit Summary.  MyChart is used to connect with patients for Virtual Visits (Telemedicine).  Patients are able to view lab/test results, encounter notes, upcoming appointments, etc.  Non-urgent messages can be sent to your provider as well.   To learn more about what you can do with MyChart, go to [email or web address].    Your next appointment:   12 month(s)  Provider:   Christopher L Schumann, MD      

## 2022-12-30 DIAGNOSIS — M5416 Radiculopathy, lumbar region: Secondary | ICD-10-CM | POA: Diagnosis not present

## 2023-01-02 DIAGNOSIS — M5416 Radiculopathy, lumbar region: Secondary | ICD-10-CM | POA: Diagnosis not present

## 2023-01-09 ENCOUNTER — Ambulatory Visit: Payer: Medicare PPO | Admitting: Podiatry

## 2023-01-18 ENCOUNTER — Other Ambulatory Visit (HOSPITAL_COMMUNITY): Payer: Self-pay

## 2023-01-18 DIAGNOSIS — M5451 Vertebrogenic low back pain: Secondary | ICD-10-CM | POA: Diagnosis not present

## 2023-01-24 ENCOUNTER — Encounter: Payer: Self-pay | Admitting: Podiatry

## 2023-01-24 ENCOUNTER — Ambulatory Visit: Payer: Medicare PPO | Admitting: Podiatry

## 2023-01-24 DIAGNOSIS — B353 Tinea pedis: Secondary | ICD-10-CM | POA: Diagnosis not present

## 2023-01-24 NOTE — Progress Notes (Signed)
  Subjective:  Patient ID: Stacey Lawrence, female    DOB: 10-23-1950,   MRN: [id number]  Chief Complaint  Patient presents with   Nail Problem    3 weeks f/u patient states feet are doing much better since the last visit but they are still improving     72 y.o. female presents for follow-up of foot fungus. Relates they are improving and doing better but still have room to improve. .   She is a retired Scientist, forensic.  . Denies any other pedal complaints. Denies n/v/f/c.   Past Medical History:  Diagnosis Date   Anemia    hx   Anxiety    CAD (coronary artery disease)    a. 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 05/2013 Lexi CL: EF 77%, old small inferolat scar, no ischemia.   CHF (congestive heart failure) (HCC)    Common migraine with intractable migraine 06/16/2017   Complication of anesthesia    dificulty waking up   Depression    Fibromyalgia    GERD (gastroesophageal reflux disease)    hasn't started taking any meds   History of blood transfusion    20yrs ago   History of kidney stones    Hx of colonic polyps    IBS (irritable bowel syndrome)    constipation   Joint pain    Joint swelling    Migraine    "q other week" (02/04/2016)   Myocardial infarction (HCC) 2014   Numbness    right leg   Polycythemia    PONV (postoperative nausea and vomiting)    Raynaud's disease    Sciatica of right side 04/21/2020   Scoliosis    Shortness of breath dyspnea    with exertion   SI (sacroiliac) joint dysfunction    right   Wears glasses     Objective:  Physical Exam: Vascular: DP/PT pulses 2/4 bilateral. CFT <3 seconds. Normal hair growth on digits. No edema.  Skin. No lacerations or abrasions bilateral feet. Scaling and erythema  improved.  Musculoskeletal: MMT 5/5 bilateral lower extremities in DF, PF, Inversion and Eversion. Deceased ROM in DF of ankle joint.  Neurological: Sensation intact to light touch.   Assessment:   1. Tinea pedis of both feet        Plan:  Patient was evaluated and treated and all questions answered. -Examined patient -Discussed treatment options for tinea pedis.  .-Continue lotrisone -Follow-up as needed.   Louann Sjogren, DPM

## 2023-01-31 DIAGNOSIS — R7301 Impaired fasting glucose: Secondary | ICD-10-CM | POA: Diagnosis not present

## 2023-02-02 DIAGNOSIS — M5416 Radiculopathy, lumbar region: Secondary | ICD-10-CM | POA: Diagnosis not present

## 2023-02-03 DIAGNOSIS — Z0000 Encounter for general adult medical examination without abnormal findings: Secondary | ICD-10-CM | POA: Diagnosis not present

## 2023-02-06 DIAGNOSIS — F329 Major depressive disorder, single episode, unspecified: Secondary | ICD-10-CM | POA: Diagnosis not present

## 2023-02-06 DIAGNOSIS — K219 Gastro-esophageal reflux disease without esophagitis: Secondary | ICD-10-CM | POA: Diagnosis not present

## 2023-02-06 DIAGNOSIS — R1310 Dysphagia, unspecified: Secondary | ICD-10-CM | POA: Diagnosis not present

## 2023-02-06 DIAGNOSIS — M797 Fibromyalgia: Secondary | ICD-10-CM | POA: Diagnosis not present

## 2023-02-06 DIAGNOSIS — R7301 Impaired fasting glucose: Secondary | ICD-10-CM | POA: Diagnosis not present

## 2023-02-06 DIAGNOSIS — R634 Abnormal weight loss: Secondary | ICD-10-CM | POA: Diagnosis not present

## 2023-02-06 DIAGNOSIS — G43009 Migraine without aura, not intractable, without status migrainosus: Secondary | ICD-10-CM | POA: Diagnosis not present

## 2023-02-06 DIAGNOSIS — E782 Mixed hyperlipidemia: Secondary | ICD-10-CM | POA: Diagnosis not present

## 2023-02-06 DIAGNOSIS — R251 Tremor, unspecified: Secondary | ICD-10-CM | POA: Diagnosis not present

## 2023-02-09 ENCOUNTER — Other Ambulatory Visit (HOSPITAL_COMMUNITY): Payer: Self-pay

## 2023-02-09 ENCOUNTER — Ambulatory Visit: Payer: Medicare PPO | Admitting: Gastroenterology

## 2023-02-14 DIAGNOSIS — N3946 Mixed incontinence: Secondary | ICD-10-CM | POA: Diagnosis not present

## 2023-02-14 DIAGNOSIS — R311 Benign essential microscopic hematuria: Secondary | ICD-10-CM | POA: Diagnosis not present

## 2023-02-15 DIAGNOSIS — M79641 Pain in right hand: Secondary | ICD-10-CM | POA: Diagnosis not present

## 2023-02-15 DIAGNOSIS — M1812 Unilateral primary osteoarthritis of first carpometacarpal joint, left hand: Secondary | ICD-10-CM | POA: Diagnosis not present

## 2023-02-15 DIAGNOSIS — M79644 Pain in right finger(s): Secondary | ICD-10-CM | POA: Diagnosis not present

## 2023-02-15 DIAGNOSIS — M79642 Pain in left hand: Secondary | ICD-10-CM | POA: Diagnosis not present

## 2023-02-22 DIAGNOSIS — Z789 Other specified health status: Secondary | ICD-10-CM | POA: Diagnosis not present

## 2023-02-22 DIAGNOSIS — L820 Inflamed seborrheic keratosis: Secondary | ICD-10-CM | POA: Diagnosis not present

## 2023-02-22 DIAGNOSIS — L814 Other melanin hyperpigmentation: Secondary | ICD-10-CM | POA: Diagnosis not present

## 2023-02-22 DIAGNOSIS — L538 Other specified erythematous conditions: Secondary | ICD-10-CM | POA: Diagnosis not present

## 2023-02-22 DIAGNOSIS — L298 Other pruritus: Secondary | ICD-10-CM | POA: Diagnosis not present

## 2023-02-22 DIAGNOSIS — D485 Neoplasm of uncertain behavior of skin: Secondary | ICD-10-CM | POA: Diagnosis not present

## 2023-02-22 DIAGNOSIS — L821 Other seborrheic keratosis: Secondary | ICD-10-CM | POA: Diagnosis not present

## 2023-02-22 DIAGNOSIS — C4441 Basal cell carcinoma of skin of scalp and neck: Secondary | ICD-10-CM | POA: Diagnosis not present

## 2023-02-22 DIAGNOSIS — R208 Other disturbances of skin sensation: Secondary | ICD-10-CM | POA: Diagnosis not present

## 2023-03-09 DIAGNOSIS — L989 Disorder of the skin and subcutaneous tissue, unspecified: Secondary | ICD-10-CM | POA: Diagnosis not present

## 2023-03-09 DIAGNOSIS — C4441 Basal cell carcinoma of skin of scalp and neck: Secondary | ICD-10-CM | POA: Diagnosis not present

## 2023-03-16 DIAGNOSIS — K5730 Diverticulosis of large intestine without perforation or abscess without bleeding: Secondary | ICD-10-CM | POA: Diagnosis not present

## 2023-03-16 DIAGNOSIS — N281 Cyst of kidney, acquired: Secondary | ICD-10-CM | POA: Diagnosis not present

## 2023-03-16 DIAGNOSIS — R311 Benign essential microscopic hematuria: Secondary | ICD-10-CM | POA: Diagnosis not present

## 2023-03-16 DIAGNOSIS — R319 Hematuria, unspecified: Secondary | ICD-10-CM | POA: Diagnosis not present

## 2023-03-16 DIAGNOSIS — N200 Calculus of kidney: Secondary | ICD-10-CM | POA: Diagnosis not present

## 2023-03-21 DIAGNOSIS — D225 Melanocytic nevi of trunk: Secondary | ICD-10-CM | POA: Diagnosis not present

## 2023-03-21 DIAGNOSIS — L821 Other seborrheic keratosis: Secondary | ICD-10-CM | POA: Diagnosis not present

## 2023-03-21 DIAGNOSIS — L814 Other melanin hyperpigmentation: Secondary | ICD-10-CM | POA: Diagnosis not present

## 2023-03-21 DIAGNOSIS — Z08 Encounter for follow-up examination after completed treatment for malignant neoplasm: Secondary | ICD-10-CM | POA: Diagnosis not present

## 2023-03-21 DIAGNOSIS — Z85828 Personal history of other malignant neoplasm of skin: Secondary | ICD-10-CM | POA: Diagnosis not present

## 2023-03-28 NOTE — Progress Notes (Unsigned)
No chief complaint on file.   HISTORY OF PRESENT ILLNESS:  03/28/23 ALL:  Stacey Lawrence is a 72 y.o. female here today for follow up for tremor. She was last seen by Dr Terrace Arabia 07/2021 for gait abnormalities. MRI cervical spine ordered due to urinary dysfunction and brisk reflexes. MRI showed multilevel degenerative changes but no cord compression. PT advised. Since,    HISTORY (copied from Dr Zannie Cove previous note)  Stacey Lawrence is a 72 year old female, seen in request by primary care physician Dr. Margo Aye, Kathleene Hazel, evaluation of gait abnormality, initial evaluation was on August 03, 2021   I reviewed and summarized the referring note. PMHX. CAD Depression, Anxiety Chronic migraine.   She had a history of chronic neck pain, is a retired Producer, television/film/video for 33 years, since beginning of 2022, she noticed unbalanced gait, especially when she suddenly change positions, tends to bump into the door frame, fell few times  In addition, she noticed worsening bilateral hands paresthesia, especially first 2 fingers, weak grip, denies toe paresthesia, she does have neck pain, radiating pain to bilateral shoulder, had a history of right SI joint fusion, now presented with left hip pain  She has worsening urinary frequency, urgency, chronic constipation   REVIEW OF SYSTEMS: Out of a complete 14 system review of symptoms, the patient complains only of the following symptoms, and all other reviewed systems are negative.   ALLERGIES: Allergies  Allergen Reactions   Dilaudid [Hydromorphone Hcl] Other (See Comments)    Resp. Arrest Body drop in temperature   Codeine Nausea And Vomiting and Rash   Morphine And Codeine Nausea And Vomiting     HOME MEDICATIONS: Outpatient Medications Prior to Visit  Medication Sig Dispense Refill   BIOTIN PO Take 1 tablet by mouth daily.     Calcium Citrate-Vitamin D (CALCITRATE/VITAMIN D PO) Take 1 tablet by mouth daily.     Cholecalciferol  (VITAMIN D3) 50 MCG (2000 UT) TABS Take 2,000 Units by mouth daily.     clotrimazole-betamethasone (LOTRISONE) cream Apply 1 Application topically daily. 30 g 0   COLLAGEN PO Take 1 tablet by mouth daily.     famotidine (PEPCID) 20 MG tablet Take 1 tablet (20 mg total) by mouth at bedtime. 60 tablet 1   ketoconazole (NIZORAL) 2 % cream Apply 1 Application topically daily. 60 g 2   mirtazapine (REMERON SOL-TAB) 15 MG disintegrating tablet Take 1 tablet (15 mg total) by mouth at bedtime. 90 tablet 1   omeprazole (PRILOSEC) 40 MG capsule Take 1 capsule (40 mg total) by mouth daily. 90 capsule 3   Turmeric (QC TUMERIC COMPLEX PO) Take 1,500 mg by mouth daily.     No facility-administered medications prior to visit.     PAST MEDICAL HISTORY: Past Medical History:  Diagnosis Date   Anemia    hx   Anxiety    CAD (coronary artery disease)    a. 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 05/2013 Lexi CL: EF 77%, old small inferolat scar, no ischemia.   CHF (congestive heart failure) (HCC)    Common migraine with intractable migraine 06/16/2017   Complication of anesthesia    dificulty waking up  Depression    Fibromyalgia    GERD (gastroesophageal reflux disease)    hasn't started taking any meds   History of blood transfusion    73yrs ago   History of kidney stones    Hx of colonic polyps    IBS (irritable bowel syndrome)    constipation   Joint pain    Joint swelling    Migraine    "q other week" (02/04/2016)   Myocardial infarction (HCC) 2014   Numbness    right leg   Polycythemia    PONV (postoperative nausea and vomiting)    Raynaud's disease    Sciatica of right side 04/21/2020   Scoliosis    Shortness of breath dyspnea    with  exertion   SI (sacroiliac) joint dysfunction    right   Wears glasses      PAST SURGICAL HISTORY: Past Surgical History:  Procedure Laterality Date   ABDOMINAL HYSTERECTOMY     partial   APPENDECTOMY     with explor. lap.   BIOPSY  05/31/2022   Procedure: BIOPSY;  Surgeon: Dolores Frame, MD;  Location: [facility];  Service: Gastroenterology;;   CARDIAC CATHETERIZATION     CHOLECYSTECTOMY  10/28/2002   lap.   COLONOSCOPY     COLONOSCOPY WITH PROPOFOL N/A 08/11/2021   Procedure: COLONOSCOPY WITH PROPOFOL;  Surgeon: Malissa Hippo, MD;  Location: [facility];  Service: Endoscopy;  Laterality: N/A;  8:30   DEBRIDEMENT TENNIS ELBOW     DIAGNOSTIC LAPAROSCOPY     DILATION AND CURETTAGE OF UTERUS     ELBOW SURGERY     golfer's elbow-bilateral   ESOPHAGEAL DILATION N/A 05/31/2022   Procedure: ESOPHAGEAL DILATION;  Surgeon: Dolores Frame, MD;  Location: [facility];  Service: Gastroenterology;  Laterality: N/A;   ESOPHAGOGASTRODUODENOSCOPY  09/10/2012   Procedure: ESOPHAGOGASTRODUODENOSCOPY (EGD);  Surgeon: Malissa Hippo, MD;  Location: [facility];  Service: Endoscopy;  Laterality: N/A;  730   ESOPHAGOGASTRODUODENOSCOPY (EGD) WITH PROPOFOL N/A 05/31/2022   Procedure: ESOPHAGOGASTRODUODENOSCOPY (EGD) WITH PROPOFOL;  Surgeon: Dolores Frame, MD;  Location: [facility];  Service: Gastroenterology;  Laterality: N/A;  205 ASA 3   EXCISION/RELEASE BURSA HIP Left 02/04/2016   Procedure: LEFT HIP TROCHANTERIC BURSECTOMY;  Surgeon: Kathryne Hitch, MD;  Location: [facility] OR;  Service: Orthopedics;  Laterality: Left;   I & D EXTREMITY  02/25/2012   Procedure: IRRIGATION AND DEBRIDEMENT EXTREMITY;  Surgeon: Dominica Severin, MD;  Location: [facility] OR;  Service: Orthopedics;  Laterality: Right;  Irrigation and Debridement and Repair Right Thumb Nerve Laceration and Assoiated Structures    KNEE DEBRIDEMENT     right   LAPAROTOMY     LEFT HEART  CATHETERIZATION WITH CORONARY ANGIOGRAM N/A 01/16/2013   Procedure: LEFT HEART CATHETERIZATION WITH CORONARY ANGIOGRAM;  Surgeon: Dolores Patty, MD;  Location: [facility] CATH LAB;  Service: Cardiovascular;  Laterality: N/A;   POLYPECTOMY  08/11/2021   Procedure: POLYPECTOMY;  Surgeon: Malissa Hippo, MD;  Location: [facility];  Service: Endoscopy;;   SACROILIAC JOINT FUSION Right 05/02/2019   Procedure: SACROILIAC JOINT FUSION;  Surgeon: Venita Lick, MD;  Location: [facility] OR;  Service: Orthopedics;  Laterality: Right;  SACROILIAC JOINT FUSION   STAPEDECTOMY     right   TONSILLECTOMY AND ADENOIDECTOMY     TRIGGER FINGER RELEASE  08/19/2011   Procedure: RELEASE TRIGGER FINGER/A-1 PULLEY;  Surgeon: Oletta Cohn III;  Location: [facility];  Service: Orthopedics;  Laterality: Right;  right middle finger a-1 pulley release with tenosynovectomy   TRIGGER FINGER RELEASE     x 5   TROCHANTERIC BURSA EXCISION Right 02/04/2016   TUBAL LIGATION     TYMPANOPLASTY       FAMILY HISTORY: Family History  Problem Relation Age of Onset   Cancer Mother        Deceased with lung CA   Heart failure Mother    Heart disease Mother    COPD Mother    Varicose Veins Mother    Depression Mother    Cancer Father        Deceased with lung CA   Heart disease Father    Varicose Veins Father    Depression Father    Depression Brother    Lung cancer Brother    ADD / ADHD Son    Parkinson's disease Sister    Other Sister        enlarged heart    Tremor Maternal Grandmother    Parkinson's disease Paternal Grandfather    Lung cancer Other        3 aunts, 2 uncles on both sides of family      SOCIAL HISTORY: Social History   Socioeconomic History   Marital status: Married    Spouse name: Not on file   Number of children: 2   Years of education: 14   Highest education level: Not on file  Occupational History   Not on file  Tobacco Use   Smoking status: Never    Passive  exposure: Never   Smokeless tobacco: Never  Vaping Use   Vaping Use: Never used  Substance and Sexual Activity   Alcohol use: No    Alcohol/week: 0.0 standard drinks of alcohol   Drug use: No   Sexual activity: Not Currently    Birth control/protection: Surgical  Other Topics Concern   Not on file  Social History Narrative   Lives in [place] with husband and her mother in law lives with them   Grown children.     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 There is no height or weight on file to calculate BMI.  Generalized: Well developed, in no acute distress  Cardiology: normal rate and rhythm, no murmur auscultated  Respiratory: clear to auscultation bilaterally    Neurological examination  Mentation: Alert oriented to time, place, history taking. Follows all commands speech and language fluent Cranial nerve II-XII: Pupils were equal round reactive to light. Extraocular movements were full, visual field were full on confrontational test. Facial sensation and strength were normal. Uvula tongue midline. Head turning and shoulder shrug  were normal and symmetric. Motor: The motor testing reveals 5 over 5 strength of all 4 extremities. Good symmetric motor tone is noted throughout.  Sensory: Sensory testing is intact to soft touch on all 4 extremities. No evidence of extinction is noted.  Coordination: Cerebellar testing reveals good finger-nose-finger and heel-to-shin bilaterally.  Gait and station: Gait is normal. Tandem gait is normal. Romberg is  negative. No drift is seen.   Reflexes: Deep tendon reflexes are symmetric and normal bilaterally.    DIAGNOSTIC DATA (LABS, IMAGING, TESTING) - I reviewed patient records, labs, notes, testing and imaging myself where available.  Lab Results  Component Value Date   WBC 5.0 04/29/2019   HGB 14.5 04/29/2019   HCT 46.8 (H) 04/29/2019   MCV 92.9 04/29/2019   PLT 247 04/29/2019      Component Value Date/Time   NA 142 04/29/2019 0851   K 3.7 04/29/2019 0851   CL 105 04/29/2019 0851   CO2 27 04/29/2019 0851   GLUCOSE 87 04/29/2019 0851   BUN 12 04/29/2019 0851   CREATININE 0.94 04/29/2019 0851   CREATININE 0.79 07/16/2014 1707   CALCIUM 9.3 04/29/2019 0851   PROT 6.7 04/29/2019 0851   ALBUMIN 3.9 04/29/2019 0851   AST 20 04/29/2019 0851   ALT 11 04/29/2019 0851   ALKPHOS 99 04/29/2019 0851   BILITOT 0.6 04/29/2019 0851   GFRNONAA >60 04/29/2019 0851   GFRAA >60 04/29/2019 0851   Lab Results  Component Value Date   CHOL 176 01/02/2019   HDL 80 01/02/2019   LDLCALC 85 01/02/2019   TRIG 53 01/02/2019   CHOLHDL 2.2 01/02/2019   Lab Results  Component Value Date   HGBA1C 5.7 (H) 10/20/2014   No results found for: "VITAMINB12" Lab Results  Component Value Date   TSH 3.650 12/07/2022        No data to display               No data to display           ASSESSMENT AND PLAN  72 y.o. year old female  has a past medical history of Anemia, Anxiety, CAD (coronary artery disease), CHF (congestive heart failure) (HCC), Common migraine with intractable migraine (06/16/2017), Complication of anesthesia, Depression, Fibromyalgia, GERD (gastroesophageal reflux disease), History of blood transfusion, History of kidney stones, colonic polyps, IBS (irritable bowel syndrome), Joint pain, Joint swelling, Migraine, Myocardial infarction (HCC) (2014), Numbness, Polycythemia, PONV (postoperative nausea and vomiting), Raynaud's disease, Sciatica of right side (04/21/2020), Scoliosis, Shortness of breath  dyspnea, SI (sacroiliac) joint dysfunction, and Wears glasses. here with    No diagnosis found.  Charleen Kirks ***.  Healthy lifestyle habits encouraged. *** will follow up with PCP as directed. *** will return to see me in ***, sooner if needed. *** verbalizes understanding and agreement with this plan.   No orders of the defined types were placed in this encounter.    No orders of the defined types were placed in this encounter.    Shawnie Dapper, MSN, FNP-C 03/28/2023, 4:50 PM  [facility] [address] [place], [place] [address] [phone]

## 2023-03-28 NOTE — Patient Instructions (Signed)
Below is our plan:  We will refer you to occupational therapy. We can consider a medication if tremor worsens.   Please make sure you are staying well hydrated. I recommend 50-60 ounces daily. Well balanced diet and regular exercise encouraged. Consistent sleep schedule with 6-8 hours recommended.   Please continue follow up with care team as directed.   Follow up with me in 1 year, sooner if needed   You may receive a survey regarding today's visit. I encourage you to leave honest feed back as I do use this information to improve patient care. Thank you for seeing me today!

## 2023-03-29 ENCOUNTER — Encounter: Payer: Self-pay | Admitting: Family Medicine

## 2023-03-29 ENCOUNTER — Ambulatory Visit: Payer: Medicare HMO | Admitting: Family Medicine

## 2023-03-29 VITALS — BP 97/60 | HR 79 | Ht 66.0 in | Wt 135.5 lb

## 2023-03-29 DIAGNOSIS — G250 Essential tremor: Secondary | ICD-10-CM | POA: Diagnosis not present

## 2023-04-11 ENCOUNTER — Ambulatory Visit: Payer: Medicare HMO | Attending: Family Medicine | Admitting: Occupational Therapy

## 2023-04-11 ENCOUNTER — Encounter: Payer: Self-pay | Admitting: Occupational Therapy

## 2023-04-11 ENCOUNTER — Other Ambulatory Visit: Payer: Self-pay

## 2023-04-11 DIAGNOSIS — R208 Other disturbances of skin sensation: Secondary | ICD-10-CM | POA: Insufficient documentation

## 2023-04-11 DIAGNOSIS — R29818 Other symptoms and signs involving the nervous system: Secondary | ICD-10-CM | POA: Insufficient documentation

## 2023-04-11 DIAGNOSIS — M6281 Muscle weakness (generalized): Secondary | ICD-10-CM | POA: Diagnosis not present

## 2023-04-11 DIAGNOSIS — R278 Other lack of coordination: Secondary | ICD-10-CM | POA: Diagnosis not present

## 2023-04-11 DIAGNOSIS — G250 Essential tremor: Secondary | ICD-10-CM | POA: Diagnosis not present

## 2023-04-11 DIAGNOSIS — R251 Tremor, unspecified: Secondary | ICD-10-CM | POA: Insufficient documentation

## 2023-04-11 NOTE — Therapy (Unsigned)
OUTPATIENT OCCUPATIONAL THERAPY NEURO EVALUATION  Patient Name: Stacey Lawrence MRN: [id number] DOB:30-Jul-1951, 72 y.o., female Today's Date: 04/11/2023  PCP: Benita Stabile, MD REFERRING PROVIDER: Shawnie Dapper, NP  END OF SESSION:  OT End of Session - 04/11/23 1449     Visit Number 1    Number of Visits 6   + evaluation   Date for OT Re-Evaluation 06/02/23    Authorization Type [employer]    OT Start Time 1446    OT Stop Time 1535    OT Time Calculation (min) 49 min    Equipment Utilized During Treatment Testing Material    Activity Tolerance Patient tolerated treatment well    Behavior During Therapy WFL for tasks assessed/performed             Past Medical History:  Diagnosis Date   Anemia    hx   Anxiety    CAD (coronary artery disease)    a. 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 05/2013 Lexi CL: EF 77%, old small inferolat scar, no ischemia.   CHF (congestive heart failure) (HCC)    Common migraine with intractable migraine 06/16/2017   Complication of anesthesia    dificulty waking up   Depression    Fibromyalgia    GERD (gastroesophageal reflux disease)    hasn't started taking any meds   History of blood transfusion    37yrs ago   History of kidney stones    Hx of colonic polyps    IBS (irritable bowel syndrome)    constipation   Joint pain    Joint swelling    Migraine    "q other week" (02/04/2016)   Myocardial infarction (HCC) 2014   Numbness    right leg   Polycythemia    PONV (postoperative nausea and vomiting)    Raynaud's disease    Sciatica of right side 04/21/2020   Scoliosis    Shortness of breath dyspnea    with exertion   SI (sacroiliac) joint dysfunction    right   Wears glasses     Past Surgical History:  Procedure Laterality Date   ABDOMINAL HYSTERECTOMY     partial   APPENDECTOMY     with explor. lap.   BIOPSY  05/31/2022   Procedure: BIOPSY;  Surgeon: Dolores Frame, MD;  Location: [facility];  Service: Gastroenterology;;   CARDIAC CATHETERIZATION     CHOLECYSTECTOMY  10/28/2002   lap.   COLONOSCOPY     COLONOSCOPY WITH PROPOFOL N/A 08/11/2021   Procedure: COLONOSCOPY WITH PROPOFOL;  Surgeon: Malissa Hippo, MD;  Location: [facility];  Service: Endoscopy;  Laterality: N/A;  8:30   DEBRIDEMENT TENNIS ELBOW     DIAGNOSTIC LAPAROSCOPY     DILATION AND CURETTAGE OF UTERUS     ELBOW SURGERY     golfer's elbow-bilateral   ESOPHAGEAL DILATION N/A 05/31/2022   Procedure: ESOPHAGEAL DILATION;  Surgeon: Dolores Frame, MD;  Location: [facility];  Service: Gastroenterology;  Laterality: N/A;   ESOPHAGOGASTRODUODENOSCOPY  09/10/2012   Procedure: ESOPHAGOGASTRODUODENOSCOPY (EGD);  Surgeon: Malissa Hippo, MD;  Location: [facility];  Service: Endoscopy;  Laterality: N/A;  730   ESOPHAGOGASTRODUODENOSCOPY (  EGD) WITH PROPOFOL N/A 05/31/2022   Procedure: ESOPHAGOGASTRODUODENOSCOPY (EGD) WITH PROPOFOL;  Surgeon: Dolores Frame, MD;  Location: [facility];  Service: Gastroenterology;  Laterality: N/A;  205 ASA 3   EXCISION/RELEASE BURSA HIP Left 02/04/2016   Procedure: LEFT HIP TROCHANTERIC BURSECTOMY;  Surgeon: Kathryne Hitch, MD;  Location: [facility] OR;  Service: Orthopedics;  Laterality: Left;   I & D EXTREMITY  02/25/2012   Procedure: IRRIGATION AND DEBRIDEMENT EXTREMITY;  Surgeon: Dominica Severin, MD;  Location: [facility] OR;  Service: Orthopedics;  Laterality: Right;  Irrigation and Debridement and Repair Right Thumb Nerve Laceration and Assoiated Structures    KNEE DEBRIDEMENT     right   LAPAROTOMY     LEFT HEART CATHETERIZATION WITH CORONARY ANGIOGRAM N/A 01/16/2013   Procedure: LEFT HEART CATHETERIZATION WITH CORONARY  ANGIOGRAM;  Surgeon: Dolores Patty, MD;  Location: [facility] CATH LAB;  Service: Cardiovascular;  Laterality: N/A;   POLYPECTOMY  08/11/2021   Procedure: POLYPECTOMY;  Surgeon: Malissa Hippo, MD;  Location: [facility];  Service: Endoscopy;;   SACROILIAC JOINT FUSION Right 05/02/2019   Procedure: SACROILIAC JOINT FUSION;  Surgeon: Venita Lick, MD;  Location: [facility] OR;  Service: Orthopedics;  Laterality: Right;  SACROILIAC JOINT FUSION   STAPEDECTOMY     right   TONSILLECTOMY AND ADENOIDECTOMY     TRIGGER FINGER RELEASE  08/19/2011   Procedure: RELEASE TRIGGER FINGER/A-1 PULLEY;  Surgeon: Oletta Cohn III;  Location: [facility];  Service: Orthopedics;  Laterality: Right;  right middle finger a-1 pulley release with tenosynovectomy   TRIGGER FINGER RELEASE     x 5   TROCHANTERIC BURSA EXCISION Right 02/04/2016   TUBAL LIGATION     TYMPANOPLASTY     Patient Active Problem List   Diagnosis Date Noted   Abdominal pain, chronic, epigastric 08/08/2022   Lower abdominal pain 08/08/2022   Loss of weight 05/05/2022   Dysphagia 05/05/2022   Gait abnormality 08/03/2021   Irritable bowel syndrome (IBS) 07/13/2021   Sciatica of right side 04/21/2020   Microvascular angina 01/01/2019   Unstable angina (HCC)    Common migraine with intractable migraine 06/16/2017   Tremor 11/16/2016   Trochanteric bursitis of left hip 02/04/2016   Trochanteric bursitis of both hips 10/31/2015   Chest pain at rest 06/17/2015   Inferior myocardial infarction [facility]) 06/17/2015   Sprain of interphalangeal joint of left little finger 02/05/2014   Pain in joint, hand 02/05/2014   Midsternal chest pain 06/04/2013   CAD (coronary artery disease)    Fibromyalgia    Gastroesophageal reflux disease    Migraine headache    Acute non-STEMI < 8 weeks prior, subsequent admission after initial 01/16/2013   Radial nerve laceration 03/27/2012   Lack of coordination 03/27/2012   Muscle weakness  (generalized) 03/27/2012    ONSET DATE: 03/29/2023  REFERRING DIAG: G25.0 (ICD-10-CM) - Essential tremor  THERAPY DIAG:  Other lack of coordination  Muscle weakness (generalized)  Other symptoms and signs involving the nervous system  Other disturbances of skin sensation  Rationale for Evaluation and Treatment: Rehabilitation  SUBJECTIVE:   SUBJECTIVE STATEMENT: Patient reports she has a sample medication for kidney issues not listed on medication list (returns next week to see about getting prescription to continue. She reports having been seen for therapy for bladder issues in the past.  Pt accompanied by: self  PERTINENT HISTORY:   Patient reports 22 surgeries in her lifetime, B tennis/golf surgery repairs, Trigger finger surgeries - Both  thumbs, both middle fingers & R thumb surgery where she cut the tendons, Fibromyalgia x20 years, Raynaud's syndrome and Sjgren's syndrome (extensive dryness) x 30 years  PRECAUTIONS: None  WEIGHT BEARING RESTRICTIONS: No  PAIN:  Are you having pain? Yes: NPRS scale: 9 (all the time)/10 Pain location: back and R hip (last lumbar disc in need of surgical repair) Pain description: sharp pains down her leg Aggravating factors: standing and moving (has done PT in the past) Relieving factors: Advil, ice pack, move and then sit  FALLS: Has patient fallen in last 6 months? 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  Tremors come and go - they are worse at night when she is tired. She has broken 3 glasses and a bowl ie) knocking them over or dropping item, in the past 2 weeks. No  strength. Typing is getting worse.  OBJECTIVE:   HAND DOMINANCE: Right  ADLs: Overall ADLs: Independent Transfers/ambulation related to ADLs: I Eating: Harder to cut stuff recently Grooming: I UB Dressing: I but starting to have trouble with little buttons LB Dressing: I Toileting: I  Bathing: I  Tub Shower transfers: I Equipment: Shower seat with back  IADLs: Shopping: Still driving (vehicle, tractor, Health and safety inspector) Light housekeeping: Mopping hurts Meal Prep: MI - trouble cutting raw meats, she has found 2 knives that work well for her Community mobility: I/MI with walking stick at times Medication management: I with pill bottles Financial management: Husband does finances Handwriting: 75% legible  MOBILITY STATUS: Independent  POSTURE COMMENTS:  forward head Sitting balance: WFL  ACTIVITY TOLERANCE: Activity tolerance: Good  FUNCTIONAL OUTCOME MEASURES: TBD  UPPER EXTREMITY ROM:    Active ROM Right eval Left eval  Shoulder flexion WFL  Shoulder abduction   Shoulder adduction   Shoulder extension   Shoulder internal rotation   Shoulder external rotation   Elbow flexion   Elbow extension   Wrist flexion   Wrist extension   Wrist ulnar deviation   Wrist radial deviation   Wrist pronation   Wrist supination   (Blank rows = not tested)  UPPER EXTREMITY MMT:     MMT Right eval Left eval  Shoulder flexion    Shoulder abduction    Shoulder adduction    Shoulder extension    Shoulder internal rotation    Shoulder external rotation    Middle trapezius    Lower trapezius    Elbow flexion    Elbow extension    Wrist flexion 3+/5 3+/5  Wrist extension 3/5 3/5  Wrist ulnar deviation    Wrist radial deviation    Wrist pronation    Wrist supination    (Blank rows = not tested)  HAND FUNCTION: Grip strength: Right: 46.0, 61.5, 67.2  lbs; Left: 55.9, 60.4, 62.1  lbs Average: Right: 58.2 lb Left: 59.5 lbs  COORDINATION: 9 Hole Peg test: Right:  22.67 sec; Left: 26.74 sec Box and Blocks:  Right 41 blocks, Left 43 blocks  SENSATION: Light touch: Impaired  - can notice distal phalange numbness and tingling  EDEMA: at night - especially after using tools  MUSCLE TONE: [facility]  COGNITION: Overall cognitive status: Within functional limits for tasks assessed  VISION: Subjective report: Doesn't drive at night - see shadows of signs at night Baseline vision: Wears glasses all the time reading is harder but can watch TV without glasses Visual history: cataracts and floaters not r/t cataracts but not enough for surgery  VISION ASSESSMENT: Not tested  Patient has difficulty with following activities due to following visual impairments: crocheting and reading without glasses.  PERCEPTION: Not tested  PRAXIS: Not tested  OBSERVATIONS: Pt ambulates with MI and no use of AE with no loss of balance. The pt appears well kept and has an active lifestyle.  She has fairly good grip strength and coordination but with some tremors which tend to be worse in the evening and difficulty with sustained activity tolerance and safety ie) has broken 3 glass items lately.    TODAY'S TREATMENT:                                                                                                                              DATE: NA   PATIENT EDUCATION: Education details: OT POC considerations and buttoning modification Person educated: Patient Education method: Explanation Education comprehension: verbalized understanding and needs further education  HOME EXERCISE PROGRAM: TBD   GOALS: Goals reviewed with patient? Yes - Will need to discuss specific goals  SHORT TERM GOALS: Target date: 05/12/23  Patient will demonstrate UE coordination HEP with 25% verbal cues or less for proper execution.  Baseline: New to OT Goal status: INITIAL  2.  Patient will complete typing assessment to assess difficulties and assist with modifications for improved ease  with typing. Baseline: Self reports typing is getting worse. Goal status: INITIAL  3.  Patient will be aware of 3+ compensatory strategies for safety in the kitchen to prevent injury. Baseline: She has broken 3 glasses and a bowl ie) knocking them over or dropping item, in the past 2 weeks. Goal status: INITIAL  4.  Patient will complete nine-hole peg with use of BUE in 20-25 seconds or less with increased ease.  Baseline:  Right: 22.67 sec; Left: 26.74 sec with self reported awkwardness Goal status: INITIAL  LONG TERM GOALS: Target date: 05/29/23  Patient will demonstrate UE ROM/coordination and sensory HEP with MI with proper execution.  Baseline: New to OT Goal status: INITIAL  2. Patient will report no dropping of objects from B UE x 1 week. Baseline: She has broken 3 glasses and a bowl ie) knocking them over or dropping item, in the past 2 weeks. Goal status: INITIAL  3.  Patient will improve pinch strength/coordination & stabilization of B hand to increase ease & independence in tying shoelaces, and managing buttons.  Baseline: Self reported difficulty. Goal status: INITIAL  4.  Improve touch sensation & calibration of grasp so patient can effectively grasp a paper/styrofoam cup w/o spilling and/or maintain hold of objects throughout home w/o slipping out of her hand. Baseline: Performing  tasks bilaterally at eval for safety. Goal status: INITIAL   ASSESSMENT:  CLINICAL IMPRESSION: Patient is a 72 y.o. female who was seen today for occupational therapy evaluation for tremors and decreased coordination. Hx includes multiple surgeries including trigger finger releases and elbow surgeries for tennis/golfers elbow. Patient currently presents below baseline level of functioning due to functional deficits and impairments as noted below. Pt would benefit from skilled OT services in the outpatient setting to work on impairments as noted below to help pt return to PLOF as able.     PERFORMANCE DEFICITS: in functional skills including ADLs, IADLs, coordination, dexterity, sensation, Fine motor control, decreased knowledge of use of DME, and UE functional use,   IMPAIRMENTS: are limiting patient from ADLs, IADLs, rest and sleep, and leisure.   CO-MORBIDITIES: has co-morbidities such as Raynaud's and h/o trigger finger surgeries and B elbow tennis/golfer's elbow  that affects occupational performance. Patient will benefit from skilled OT to address above impairments and improve overall function.  MODIFICATION OR ASSISTANCE TO COMPLETE EVALUATION: No modification of tasks or assist necessary to complete an evaluation.  OT OCCUPATIONAL PROFILE AND HISTORY: Problem focused assessment: Including review of records relating to presenting problem.  CLINICAL DECISION MAKING: LOW - limited treatment options, no task modification necessary  REHAB POTENTIAL: Good  EVALUATION COMPLEXITY: Low    PLAN:  OT FREQUENCY: 1x/week  OT DURATION: 6 weeks  PLANNED INTERVENTIONS: self care/ADL training, therapeutic exercise, therapeutic activity, neuromuscular re-education, electrical stimulation, ultrasound, fluidotherapy, patient/family education, visual/perceptual remediation/compensation, energy conservation, coping strategies training, and DME and/or AE instructions  RECOMMENDED OTHER SERVICES: NA  CONSULTED AND AGREED WITH PLAN OF CARE: Patient  PLAN FOR NEXT SESSION:  Patient wanted to discuss tics ie) the more tense she gets the more she bites her top lip  Patient to rate activities per PSFS for goal update.  Typing assessment.  Introduce coordination and strengthening (putty) activities.   Victorino Sparrow, OT 04/11/2023, 4:50 PM

## 2023-04-18 ENCOUNTER — Ambulatory Visit: Payer: Medicare HMO | Admitting: Occupational Therapy

## 2023-04-18 DIAGNOSIS — R29818 Other symptoms and signs involving the nervous system: Secondary | ICD-10-CM | POA: Diagnosis not present

## 2023-04-18 DIAGNOSIS — M6281 Muscle weakness (generalized): Secondary | ICD-10-CM

## 2023-04-18 DIAGNOSIS — R251 Tremor, unspecified: Secondary | ICD-10-CM | POA: Diagnosis not present

## 2023-04-18 DIAGNOSIS — R278 Other lack of coordination: Secondary | ICD-10-CM

## 2023-04-18 DIAGNOSIS — G250 Essential tremor: Secondary | ICD-10-CM | POA: Diagnosis not present

## 2023-04-18 DIAGNOSIS — R208 Other disturbances of skin sensation: Secondary | ICD-10-CM | POA: Diagnosis not present

## 2023-04-18 NOTE — Patient Instructions (Signed)
Putty Exercises  Access Code: [email or web address] URL: [email or web address] Date: 04/18/2023 Prepared by: Amada Kingfisher  Exercises - Putty Squeezes  - 1 x daily - 2 sets - 10 reps - 3-Point Pinch with Putty  - 1 x daily - 2 sets - 10 reps - Tip PUSH with Putty  - 1 x daily - 2 sets - 10 reps - Key Pinch with Putty  - 1 x daily - 3 sets - 10 reps - Finger Pinch and Pull with Putty  - 1 x daily - 2 sets - 10 reps - Removing Marbles from Putty  - 1 x daily - 2 sets - 10 reps

## 2023-04-18 NOTE — Therapy (Unsigned)
OUTPATIENT OCCUPATIONAL THERAPY NEURO TREATMENT  Patient Name: Stacey Lawrence MRN: [id number] DOB:11-13-1950, 72 y.o., female Today's Date: 04/18/2023  PCP: Benita Stabile, MD REFERRING PROVIDER: Shawnie Dapper, NP  END OF SESSION:  OT End of Session - 04/18/23 1457     Visit Number 2    Number of Visits 6   + evaluation   Date for OT Re-Evaluation 06/02/23    Authorization Type [employer]IC[employer]    OT Start Time 1450    OT Stop Time 1530    OT Time Calculation (min) 40 min    Equipment Utilized During Treatment Putty/FM objects    Activity Tolerance Patient tolerated treatment well    Behavior During Therapy WFL for tasks assessed/performed              Past Medical History:  Diagnosis Date   Anemia    hx   Anxiety    CAD (coronary artery disease)    a. 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 05/2013 Lex[facility] CL: EF 77%, old small inferolat scar, no ischemia.   CHF (congestive heart failure) (HCC)    Common migraine with intractable migraine 06/16/2017   Complication of anesthesia    dificulty waking up   Depression    Fibromyalgia    GERD (gastroesophageal reflux disease)    hasn't started taking any meds   History of blood transfusion    5yrs ago   History of kidney stones    Hx of colonic polyps    IBS (irritable bowel syndrome)    constipation   Joint pain    Joint swelling    Migraine    "q other week" (02/04/2016)   Myocardial infarction (HCC) 2014   Numbness    right leg   Polycythemia    PONV (postoperative nausea and vomiting)    Raynaud's disease    Sciatica of right side 04/21/2020   Scoliosis    Shortness of breath dyspnea    with exertion   SI (sacroiliac) joint dysfunction    right   Wears glasses     Past Surgical History:  Procedure Laterality Date   ABDOMINAL HYSTERECTOMY     partial   APPENDECTOMY     with explor. lap.   BIOPSY  05/31/2022   Procedure: BIOPSY;  Surgeon: Dolores Frame, MD;  Location: [facility];  Service: Gastroenterology;;   CARDIAC CATHETERIZATION     CHOLECYSTECTOMY  10/28/2002   lap.   COLONOSCOPY     COLONOSCOPY WITH PROPOFOL N/A 08/11/2021   Procedure: COLONOSCOPY WITH PROPOFOL;  Surgeon: Malissa Hippo, MD;  Location: [facility];  Service: Endoscopy;  Laterality: N/A;  8:30   DEBRIDEMENT TENNIS ELBOW     DIAGNOSTIC LAPAROSCOPY     DILATION AND CURETTAGE OF UTERUS     ELBOW SURGERY     golfer's elbow-bilateral   ESOPHAGEAL DILATION N/A 05/31/2022   Procedure: ESOPHAGEAL DILATION;  Surgeon: Dolores Frame, MD;  Location: [facility];  Service: Gastroenterology;  Laterality: N/A;   ESOPHAGOGASTRODUODENOSCOPY  09/10/2012   Procedure: ESOPHAGOGASTRODUODENOSCOPY (EGD);  Surgeon: Malissa Hippo, MD;  Location: [facility];  Service: Endoscopy;  Laterality: N/A;  730  ESOPHAGOGASTRODUODENOSCOPY (EGD) WITH PROPOFOL N/A 05/31/2022   Procedure: ESOPHAGOGASTRODUODENOSCOPY (EGD) WITH PROPOFOL;  Surgeon: Dolores Frame, MD;  Location: [facility];  Service: Gastroenterology;  Laterality: N/A;  205 ASA 3   EXCISION/RELEASE BURSA HIP Left 02/04/2016   Procedure: LEFT HIP TROCHANTERIC BURSECTOMY;  Surgeon: Kathryne Hitch, MD;  Location[facility] OR;  Service: Orthopedics;  Laterality: Left;   I & D EXTREMITY  02/25/2012   Procedure: IRRIGATION AND DEBRIDEMENT EXTREMITY;  Surgeon: Dominica Severin, MD;  Location: [facility] OR;  Service: Orthopedics;  Laterality: Right;  Irrigation and Debridement and Repair Right Thumb Nerve Laceration and Assoiated Structures    KNEE DEBRIDEMENT     right   LAPAROTOMY     LEFT HEART CATHETERIZATION WITH CORONARY ANGIOGRAM N/A 01/16/2013   Procedure: LEFT HEART CATHETERIZATION WITH CORONARY  ANGIOGRAM;  Surgeon: Dolores Patty, MD;  Location: [facility] CATH LAB;  Service: Cardiovascular;  Laterality: N/A;   POLYPECTOMY  08/11/2021   Procedure: POLYPECTOMY;  Surgeon: Malissa Hippo, MD;  Location: [facility];  Service: Endoscopy;;   SACROILIAC JOINT FUSION Right 05/02/2019   Procedure: SACROILIAC JOINT FUSION;  Surgeon: Venita Lick, MD;  Location: [facility] OR;  Service: Orthopedics;  Laterality: Right;  SACROILIAC JOINT FUSION   STAPEDECTOMY     right   TONSILLECTOMY AND ADENOIDECTOMY     TRIGGER FINGER RELEASE  08/19/2011   Procedure: RELEASE TRIGGER FINGER/A-1 PULLEY;  Surgeon: Oletta Cohn III;  Location: [facility];  Service: Orthopedics;  Laterality: Right;  right middle finger a-1 pulley release with tenosynovectomy   TRIGGER FINGER RELEASE     x 5   TROCHANTERIC BURSA EXCISION Right 02/04/2016   TUBAL LIGATION     TYMPANOPLASTY     Patient Active Problem List   Diagnosis Date Noted   Abdominal pain, chronic, epigastric 08/08/2022   Lower abdominal pain 08/08/2022   Loss of weight 05/05/2022   Dysphagia 05/05/2022   Gait abnormality 08/03/2021   Irritable bowel syndrome (IBS) 07/13/2021   Sciatica of right side 04/21/2020   Microvascular angina 01/01/2019   Unstable angina (HCC)    Common migraine with intractable migraine 06/16/2017   Tremor 11/16/2016   Trochanteric bursitis of left hip 02/04/2016   Trochanteric bursitis of both hips 10/31/2015   Chest pain at rest 06/17/2015   Inferior myocardial infarction [facility]) 06/17/2015   Sprain of interphalangeal joint of left little finger 02/05/2014   Pain in joint, hand 02/05/2014   Midsternal chest pain 06/04/2013   CAD (coronary artery disease)    Fibromyalgia    Gastroesophageal reflux disease    Migraine headache    Acute non-STEMI < 8 weeks prior, subsequent admission after initial 01/16/2013   Radial nerve laceration 03/27/2012   Lack of coordination 03/27/2012   Muscle weakness  (generalized) 03/27/2012    ONSET DATE: 03/29/2023  REFERRING DIAG: G25.0 (ICD-10-CM) - Essential tremor  THERAPY DIAG:  Other lack of coordination  Other symptoms and signs involving the nervous system  Tremors of nervous system  Muscle weakness (generalized)  Rationale for Evaluation and Treatment: Rehabilitation  SUBJECTIVE:   SUBJECTIVE STATEMENT: Patient reports she and her husband are working on rebuilding barn and she will be using a drill to screw things together.   Pt accompanied by: self  PERTINENT HISTORY:   Patient reports 22 surgeries in her lifetime, B tennis/golf surgery repairs, Trigger finger surgeries - Both thumbs, both middle fingers & R thumb surgery where she cut the tendons, Fibromyalgia x20  years, Raynaud's syndrome and Sjgren's syndrome (extensive dryness) x 30 years  PRECAUTIONS: None  WEIGHT BEARING RESTRICTIONS: No  PAIN:  Are you having pain? Yes: NPRS scale: 9 (all the time)/10 Pain location: back and R hip (last lumbar disc in need of surgical repair) Pain description: sharp pains down her leg Aggravating factors: standing and moving (has done PT in the past) Relieving factors: Advil, ice pack, move and then sit  FALLS: Has patient fallen in last 6 months? 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  Tremors come and go - they are worse at night when she is tired. She has broken 3 glasses and a bowl ie) knocking them over or dropping item, in the past 2 weeks. No strength. Typing is getting worse.  OBJECTIVE:   HAND DOMINANCE: Right  ADLs: Overall ADLs:  Independent Transfers/ambulation related to ADLs: I Eating: Harder to cut stuff recently Grooming: I UB Dressing: I but starting to have trouble with little buttons LB Dressing: I Toileting: I  Bathing: I  Tub Shower transfers: I Equipment: Shower seat with back  IADLs: Shopping: Still driving (vehicle, tractor, Health and safety inspector) Light housekeeping: Mopping hurts Meal Prep: MI - trouble cutting raw meats, she has found 2 knives that work well for her Community mobility: I/MI with walking stick at times Medication management: I with pill bottles Financial management: Husband does finances Handwriting: 75% legible  MOBILITY STATUS: Independent  POSTURE COMMENTS:  forward head Sitting balance: WFL  ACTIVITY TOLERANCE: Activity tolerance: Good  FUNCTIONAL OUTCOME MEASURES: TBD  UPPER EXTREMITY ROM:    Active ROM Right eval Left eval  Shoulder flexion WFL  Shoulder abduction   Shoulder adduction   Shoulder extension   Shoulder internal rotation   Shoulder external rotation   Elbow flexion   Elbow extension   Wrist flexion   Wrist extension   Wrist ulnar deviation   Wrist radial deviation   Wrist pronation   Wrist supination   (Blank rows = not tested)  UPPER EXTREMITY MMT:     MMT Right eval Left eval  Shoulder flexion    Shoulder abduction    Shoulder adduction    Shoulder extension    Shoulder internal rotation    Shoulder external rotation    Middle trapezius    Lower trapezius    Elbow flexion    Elbow extension    Wrist flexion 3+/5 3+/5  Wrist extension 3/5 3/5  Wrist ulnar deviation    Wrist radial deviation    Wrist pronation    Wrist supination    (Blank rows = not tested)  HAND FUNCTION: Grip strength: Right: 46.0, 61.5, 67.2  lbs; Left: 55.9, 60.4, 62.1  lbs Average: Right: 58.2 lb Left: 59.5 lbs  COORDINATION: 9 Hole Peg test: Right: 22.67 sec; Left: 26.74 sec Box and Blocks:  Right 41 blocks, Left 43 blocks  SENSATION: Light  touch: Impaired  - can notice distal phalange numbness and tingling  EDEMA: at night - especially after using tools  MUSCLE TONE: WFL  COGNITION: Overall cognitive status: Within functional limits for tasks assessed  VISION: Subjective report:  Doesn't drive at night - see shadows of signs at night Baseline vision: Wears glasses all the time reading is harder but can watch TV without glasses Visual history: cataracts and floaters not r/t cataracts but not enough for surgery  VISION ASSESSMENT: Not tested  Patient has difficulty with following activities due to following visual impairments: crocheting and reading without glasses.  PERCEPTION: Not tested  PRAXIS: Not tested  OBSERVATIONS at Evaluation: Pt ambulates with MI and no use of AE with no loss of balance. The pt appears well kept and has an active lifestyle.  She has fairly good grip strength and coordination but with some tremors which tend to be worse in the evening and difficulty with sustained activity tolerance and safety ie) has broken 3 glass items lately.    TODAY'S TREATMENT:                                                                                                                              DATE:   Therapeutic Exercises and Activities  Patient engaged in introduction of home exercise program and activities, including theraputty and coordination tasks. Handouts provided for putty exercises and demonstration provided along with patient practicing exercises.  Patient given blue putty for strengthening and stronger sensory feedback/ pressure on digits.  Patient given pink putty for finding hidden objects. Patient education provided on not overdoing the exercises ie) doing appropriate repetitions and spacing activities out over the day.   Coordination activities also introduced with handout with images provided, activities demonstrated and practice occurring. Patient is encouraged to focus on in hand manipulation of  multiple objects and then moving them out to her fingertips one at a time trying not to drop items. Patient reports playing solitaire on her phone and is encouraged to consider using cards for coordination aspect of shuffling, dealing and managing cards.   Patient also encouraged to continue working on sensory stimulation, i.e. with different textures and finding objects in the putty. Patients encouraged to work on task with one hand, vision included, and hiding different, but similar objects, i.e. different coins, and trying to identify them.  Safety education provided regarding options to help with tool use, including use of needle nose pliers in left hand to hold a screw or nail before tapping it or using the drill with the right hand.  Patient is also encouraged to take breaks, watch her hands for increased visual feedback with her sensory deficits.  PATIENT EDUCATION: Education details: OT goals, putty exercises and Coordination tasks Person educated: Patient Education method: Explanation, Demonstration, Verbal cues, and Handouts Education comprehension: verbalized understanding, returned demonstration, verbal cues required, and needs further education  HOME EXERCISE PROGRAM: 04/18/23 - Putty Exercises  Access Code: [email or web address]   Coordination Activities with Images handout provided   GOALS: Goals reviewed with patient? Yes   SHORT TERM GOALS: Target date: 05/12/23  Patient will demonstrate UE coordination HEP with 25% verbal cues or less for proper  execution.  Baseline: New to OT Goal status: IN Progress  2.  Patient will complete typing assessment to assess difficulties and assist with modifications for improved ease with typing. Baseline: Self reports typing is getting worse. Goal status: INITIAL  3.  Patient will be aware of 3+ compensatory strategies for safety in the kitchen to prevent injury. Baseline: She has broken 3 glasses and a bowl ie) knocking them over or dropping item,  in the past 2 weeks. Goal status: IN Progress  4.  Patient will complete nine-hole peg with use of BUE in 20-25 seconds or less with increased ease.  Baseline:  Right: 22.67 sec; Left: 26.74 sec with self reported awkwardness Goal status: IN Progress  LONG TERM GOALS: Target date: 05/29/23  Patient will demonstrate UE ROM/coordination and sensory HEP with MI with proper execution.  Baseline: New to OT Goal status: IN Progress  2. Patient will report no dropping of objects from B UE x 1 week. Baseline: She has broken 3 glasses and a bowl ie) knocking them over or dropping item, in the past 2 weeks. Goal status: INITIAL  3.  Patient will improve pinch strength/coordination & stabilization of B hand to increase ease & independence in tying shoelaces, and managing buttons.  Baseline: Self reported difficulty. Goal status: IN Progress  4.  Improve touch sensation & calibration of grasp so patient can effectively grasp a paper/styrofoam cup w/o spilling and/or maintain hold of objects throughout home w/o slipping out of her hand. Baseline: Performing tasks bilaterally at eval for safety. Goal status: INITIAL   ASSESSMENT:  CLINICAL IMPRESSION: Patient is a 72 y.o. female who returns for occupational therapy treatment today for history of tremors, decreased coordination and sensory deficits. Patient responded well to various putty and coordination activities presented today with patient encouraged to take breaks as tremors require, use safety ideas to protect her hands and not to over do it as she is helping with building activities on the farm this week. Pt will continue to benefit from skilled OT services in the outpatient setting to work on impairments to help pt return to max potential for UE awareness and motor skills.    PERFORMANCE DEFICITS: in functional skills including ADLs, IADLs, coordination, dexterity, sensation, Fine motor control, decreased knowledge of use of DME, and UE  functional use,   IMPAIRMENTS: are limiting patient from ADLs, IADLs, rest and sleep, and leisure.   CO-MORBIDITIES: has co-morbidities such as Raynaud's and h/o trigger finger surgeries and B elbow tennis/golfer's elbow  that affects occupational performance. Patient will benefit from skilled OT to address above impairments and improve overall function.  REHAB POTENTIAL: Good  PLAN:  OT FREQUENCY: 1x/week  OT DURATION: 6 weeks  PLANNED INTERVENTIONS: self care/ADL training, therapeutic exercise, therapeutic activity, neuromuscular re-education, electrical stimulation, ultrasound, fluidotherapy, patient/family education, visual/perceptual remediation/compensation, energy conservation, coping strategies training, and DME and/or AE instructions  RECOMMENDED OTHER SERVICES: NA  CONSULTED AND AGREED WITH PLAN OF CARE: Patient  PLAN FOR NEXT SESSION:   Review coordination and strengthening (putty) activities.   Patient to rate activities per PSFS for goal update.  Typing assessment.  Patient wanted to discuss tics ie) the more tense she gets the more she bites her top lip  Victorino Sparrow, OT 04/18/2023, 5:16 PM

## 2023-04-20 DIAGNOSIS — Z01419 Encounter for gynecological examination (general) (routine) without abnormal findings: Secondary | ICD-10-CM | POA: Diagnosis not present

## 2023-04-24 ENCOUNTER — Other Ambulatory Visit (HOSPITAL_COMMUNITY): Payer: Self-pay

## 2023-04-24 ENCOUNTER — Ambulatory Visit: Payer: Medicare HMO | Admitting: Podiatry

## 2023-04-24 ENCOUNTER — Other Ambulatory Visit: Payer: Self-pay | Admitting: Podiatry

## 2023-04-24 ENCOUNTER — Other Ambulatory Visit: Payer: Self-pay | Admitting: Obstetrics and Gynecology

## 2023-04-24 ENCOUNTER — Encounter: Payer: Self-pay | Admitting: Podiatry

## 2023-04-24 VITALS — BP 129/70 | HR 71 | Ht 66.0 in | Wt 135.0 lb

## 2023-04-24 DIAGNOSIS — M792 Neuralgia and neuritis, unspecified: Secondary | ICD-10-CM

## 2023-04-24 DIAGNOSIS — M5416 Radiculopathy, lumbar region: Secondary | ICD-10-CM | POA: Diagnosis not present

## 2023-04-24 DIAGNOSIS — M859 Disorder of bone density and structure, unspecified: Secondary | ICD-10-CM

## 2023-04-24 DIAGNOSIS — M8580 Other specified disorders of bone density and structure, unspecified site: Secondary | ICD-10-CM

## 2023-04-24 MED ORDER — GABAPENTIN 300 MG PO CAPS
300.0000 mg | ORAL_CAPSULE | Freq: Every day | ORAL | 3 refills | Status: DC
  Filled 2023-04-24: qty 90, 90d supply, fill #0

## 2023-04-24 NOTE — Progress Notes (Signed)
  Subjective:  Patient ID: Stacey Lawrence, female    DOB: August 21, 1951,   MRN: [id number]  Chief Complaint  Patient presents with   Foot Problem    Bilateral feet itching burning soaking 2 times daily     72 y.o. female presents for follow-up of foot fungus. Relates  the Lotrisone has not continues to help and still getting itching and burning pain in feet and soaking 2 times daily.  Does relate some pinched nerves in her back that surgery has been recommended but patient has been hesitant on. Has had gabapentin in the past but not in a while.   She is a retired Scientist, forensic.  . Denies any other pedal complaints. Denies n/v/f/c.   Past Medical History:  Diagnosis Date   Anemia    hx   Anxiety    CAD (coronary artery disease)    a. 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 05/2013 [facility] CL: EF 77%, old small inferolat scar, no ischemia.   CHF (congestive heart failure) (HCC)    Common migraine with intractable migraine 06/16/2017   Complication of anesthesia    dificulty waking up   Depression    Fibromyalgia    GERD (gastroesophageal reflux disease)    hasn't started taking any meds   History of blood transfusion    63yrs ago   History of kidney stones    Hx of colonic polyps    IBS (irritable bowel syndrome)    constipation   Joint pain    Joint swelling    Migraine    "q other week" (02/04/2016)   Myocardial infarction (HCC) 2014   Numbness    right leg   Polycythemia    PONV (postoperative nausea and vomiting)    Raynaud's disease    Sciatica of right side 04/21/2020   Scoliosis    Shortness of breath dyspnea    with exertion   SI (sacroiliac) joint dysfunction    right   Wears glasses     Objective:  Physical Exam: Vascular:  DP/PT pulses 2/4 bilateral. CFT <3 seconds. Normal hair growth on digits. No edema.  Skin. No lacerations or abrasions bilateral feet. Scaling and erythema improved.  Musculoskeletal: MMT 5/5 bilateral lower extremities in DF, PF, Inversion and Eversion. Deceased ROM in DF of ankle joint. No pain to palpation about the foot and negative tinels sign Neurological: Sensation intact to light touch.   Assessment:   1. Lumbar radiculopathy   2. Neuritis         Plan:  Patient was evaluated and treated and all questions answered. Discussed neuropathy and etiology as well as treatment with patient. Feel this is no longer tinea as skin has cleared.  -Discussed and educated patient on foot care, especially with  regards to the vascular, neurological and musculoskeletal systems.  -Discussed supportive shoes at all times and checking feet regularly.  -Gabapentin sent to pharmacy 300 mg nightly to start -Patient to return in 3 months for follow-up evaluation.    Louann Sjogren, DPM

## 2023-04-25 ENCOUNTER — Other Ambulatory Visit (HOSPITAL_COMMUNITY): Payer: Self-pay

## 2023-04-25 MED ORDER — CLOTRIMAZOLE-BETAMETHASONE 1-0.05 % EX CREA
1.0000 | TOPICAL_CREAM | Freq: Every day | CUTANEOUS | 0 refills | Status: DC
  Filled 2023-04-25: qty 30, 30d supply, fill #0

## 2023-05-01 ENCOUNTER — Ambulatory Visit: Payer: Medicare HMO | Attending: Family Medicine | Admitting: Occupational Therapy

## 2023-05-01 DIAGNOSIS — R251 Tremor, unspecified: Secondary | ICD-10-CM | POA: Diagnosis not present

## 2023-05-01 DIAGNOSIS — R208 Other disturbances of skin sensation: Secondary | ICD-10-CM | POA: Insufficient documentation

## 2023-05-01 DIAGNOSIS — R29818 Other symptoms and signs involving the nervous system: Secondary | ICD-10-CM | POA: Insufficient documentation

## 2023-05-01 DIAGNOSIS — R278 Other lack of coordination: Secondary | ICD-10-CM | POA: Insufficient documentation

## 2023-05-01 NOTE — Therapy (Unsigned)
OUTPATIENT OCCUPATIONAL THERAPY NEURO TREATMENT  Patient Name: Stacey Lawrence MRN: [id number] DOB:[birth year], 72 y.o., female Today's Date: 05/01/2023  PCP: Benita Stabile, MD REFERRING PROVIDER: Shawnie Dapper, NP  END OF SESSION:  OT End of Session - 05/01/23 1537     Visit Number 3    Number of Visits 6   + evaluation   Date for OT Re-Evaluation 06/02/23    Authorization Type [employer]    OT Start Time 1535    OT Stop Time 1620    OT Time Calculation (min) 45 min    Activity Tolerance Patient tolerated treatment well    Behavior During Therapy WFL for tasks assessed/performed              Past Medical History:  Diagnosis Date   Anemia    hx   Anxiety    CAD (coronary artery disease)    a. 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 05/2013 Lexi CL: EF 77%, old small inferolat scar, no ischemia.   CHF (congestive heart failure) (HCC)    Common migraine with intractable migraine 06/16/2017   Complication of anesthesia    dificulty waking up   Depression    Fibromyalgia    GERD (gastroesophageal reflux disease)    hasn't started taking any meds   History of blood transfusion    16yrs ago   History of kidney stones    Hx of colonic polyps    IBS (irritable bowel syndrome)    constipation   Joint pain    Joint swelling    Migraine    "q other week" (02/04/2016)   Myocardial infarction (HCC) 2014   Numbness    right leg   Polycythemia    PONV (postoperative nausea and vomiting)    Raynaud's disease    Sciatica of right side 04/21/2020   Scoliosis    Shortness of breath dyspnea    with exertion   SI (sacroiliac) joint dysfunction    right   Wears glasses    Past Surgical History:  Procedure Laterality Date    ABDOMINAL HYSTERECTOMY     partial   APPENDECTOMY     with explor. lap.   BIOPSY  05/31/2022   Procedure: BIOPSY;  Surgeon: Dolores Frame, MD;  [facility]: [facility];  Service: Gastroenterology;;   CARDIAC CATHETERIZATION     CHOLECYSTECTOMY  10/28/2002   lap.   COLONOSCOPY     COLONOSCOPY WITH PROPOFOL N/A 08/11/2021   Procedure: COLONOSCOPY WITH PROPOFOL;  Surgeon: Malissa Hippo, MD;  Location: [facility];  Service: Endoscopy;  Laterality: N/A;  8:30   DEBRIDEMENT TENNIS ELBOW     DIAGNOSTIC LAPAROSCOPY     DILATION AND CURETTAGE OF UTERUS     ELBOW SURGERY     golfer's elbow-bilateral   ESOPHAGEAL DILATION N/A 05/31/2022   Procedure: ESOPHAGEAL DILATION;  Surgeon: Dolores Frame, MD;  [facility]: [facility];  Service: Gastroenterology;  Laterality: N/A;   ESOPHAGOGASTRODUODENOSCOPY  09/10/2012   Procedure: ESOPHAGOGASTRODUODENOSCOPY (EGD);  Surgeon: Malissa Hippo, MD;  [facility]: [facility];  Service: Endoscopy;  Laterality: N/A;  730   ESOPHAGOGASTRODUODENOSCOPY (EGD) WITH PROPOFOL N/A 05/31/2022   Procedure:  ESOPHAGOGASTRODUODENOSCOPY (EGD) WITH PROPOFOL;  Surgeon: Marguerita Merles, Reuel Boom, MD;  Location: [facility];  Service: Gastroenterology;  Laterality: N/A;  205 ASA 3   EXCISION/RELEASE BURSA HIP Left 02/04/2016   Procedure: LEFT HIP TROCHANTERIC BURSECTOMY;  Surgeon: Kathryne Hitch, MD;  Location: [facility];  Service: Orthopedics;  Laterality: Left;   I & D EXTREMITY  02/25/2012   Procedure: IRRIGATION AND DEBRIDEMENT EXTREMITY;  Surgeon: Dominica Severin, MD;  Location: [facility] OR;  Service: Orthopedics;  Laterality: Right;  Irrigation and Debridement and Repair Right Thumb Nerve Laceration and Assoiated Structures    KNEE DEBRIDEMENT     right   LAPAROTOMY     LEFT HEART CATHETERIZATION WITH CORONARY ANGIOGRAM N/A 01/16/2013   Procedure: LEFT HEART CATHETERIZATION WITH CORONARY ANGIOGRAM;  Surgeon: Dolores Patty, MD;  Location: [facility]  CATH LAB;  Service: Cardiovascular;  Laterality: N/A;   POLYPECTOMY  08/11/2021   Procedure: POLYPECTOMY;  Surgeon: Malissa Hippo, MD;  Location: [facility];  Service: Endoscopy;;   SACROILIAC JOINT FUSION Right 05/02/2019   Procedure: SACROILIAC JOINT FUSION;  Surgeon: Venita Lick, MD;  Location: [facility] OR;  Service: Orthopedics;  Laterality: Right;  SACROILIAC JOINT FUSION   STAPEDECTOMY     right   TONSILLECTOMY AND ADENOIDECTOMY     TRIGGER FINGER RELEASE  08/19/2011   Procedure: RELEASE TRIGGER FINGER/A-1 PULLEY;  Surgeon: Oletta Cohn III;  Location: [facility];  Service: Orthopedics;  Laterality: Right;  right middle finger a-1 pulley release with tenosynovectomy   TRIGGER FINGER RELEASE     x 5   TROCHANTERIC BURSA EXCISION Right 02/04/2016   TUBAL LIGATION     TYMPANOPLASTY     Patient Active Problem List   Diagnosis Date Noted   Abdominal pain, chronic, epigastric 08/08/2022   Lower abdominal pain 08/08/2022   Loss of weight 05/05/2022   Dysphagia 05/05/2022   Gait abnormality 08/03/2021   Irritable bowel syndrome (IBS) 07/13/2021   Sciatica of right side 04/21/2020   Acquired short Achilles tendon, right 07/03/2019   Microvascular angina 01/01/2019   Unstable angina (HCC)    Common migraine with intractable migraine 06/16/2017   Tremor 11/16/2016   Trochanteric bursitis of left hip 02/04/2016   Trochanteric bursitis of both hips 10/31/2015   Chest pain at rest 06/17/2015   Inferior myocardial infarction [facility]) 06/17/2015   Sprain of interphalangeal joint of left little finger 02/05/2014   Pain in joint, hand 02/05/2014   Midsternal chest pain 06/04/2013   CAD (coronary artery disease)    Fibromyalgia    Gastroesophageal reflux disease    Migraine headache    Acute non-STEMI < 8 weeks prior, subsequent admission after initial 01/16/2013   Radial nerve laceration 03/27/2012   Lack of coordination 03/27/2012   Muscle weakness (generalized)  03/27/2012    ONSET DATE: 03/29/2023  REFERRING DIAG: G25.0 (ICD-10-CM) - Essential tremor  THERAPY DIAG:  Other disturbances of skin sensation  Other lack of coordination  Tremors of nervous system  Rationale for Evaluation and Treatment: Rehabilitation  SUBJECTIVE:   SUBJECTIVE STATEMENT: Patient has resumed Gabapentin at night due to both feet experiencing neuropathy - pain and heat on top of feet ankle to toes and sometimes it can be the bottom of the them too.  She doesn't really feel much change but MD said it could take up to a month to feel changes.  She reports also waking up foggy or with a headache, which the DR notes may occur until  she gets used to it.  Patient does report some comfort and relief for her hands through use of hemp gloves for a couple of hours in the evening.  Pt accompanied by: self  PERTINENT HISTORY:   Patient reports 22 surgeries in her lifetime, B tennis/golf surgery repairs, Trigger finger surgeries - Both thumbs, both middle fingers & R thumb surgery where she cut the tendons, Fibromyalgia x20 years, Raynaud's syndrome and Sjgren's syndrome (extensive dryness) x 30 years  PRECAUTIONS: None  WEIGHT BEARING RESTRICTIONS: No  PAIN:  Are you having pain? Yes: NPRS scale: 9 (all the time)/10 Pain location: back and R hip (last lumbar disc in need of surgical repair) Pain description: sharp pains down her leg Aggravating factors: standing and moving (has done PT in the past) Relieving factors: Advil, ice pack, move and then sit  FALLS: Has patient fallen in last 6 months? 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  Tremors come and go - they are worse at night when she is tired. She has broken 3 glasses and a bowl ie) knocking them over or dropping item, in the past 2 weeks. No strength. Typing is getting worse.  OBJECTIVE:   HAND DOMINANCE: Right  ADLs: Overall ADLs: Independent Transfers/ambulation related to ADLs: I Eating: Harder to cut stuff recently Grooming: I UB Dressing: I but starting to have trouble with little buttons LB Dressing: I Toileting: I  Bathing: I  Tub Shower transfers: I Equipment: Shower seat with back  IADLs: Shopping: Still driving (vehicle, tractor, Health and safety inspector) Light housekeeping: Mopping hurts Meal Prep: MI - trouble cutting raw meats, she has found 2 knives that work well for her Community mobility: I/MI with walking stick at times Medication management: I with pill bottles Financial management: Husband does finances Handwriting: 75% legible  MOBILITY STATUS: Independent  POSTURE COMMENTS:  forward head Sitting balance: WFL  ACTIVITY TOLERANCE: Activity tolerance: Good  FUNCTIONAL OUTCOME MEASURES: TBD  UPPER EXTREMITY ROM:    Active ROM Right eval Left eval  Shoulder flexion WFL  Shoulder abduction   Shoulder adduction   Shoulder extension   Shoulder internal rotation   Shoulder external rotation   Elbow flexion   Elbow extension   Wrist flexion   Wrist extension   Wrist ulnar deviation   Wrist radial deviation   Wrist pronation   Wrist supination   (Blank rows = not tested)  UPPER EXTREMITY MMT:     MMT Right eval Left eval  Shoulder flexion    Shoulder abduction    Shoulder adduction    Shoulder extension    Shoulder internal rotation    Shoulder external rotation    Middle trapezius    Lower trapezius    Elbow flexion    Elbow extension    Wrist flexion 3+/5 3+/5  Wrist extension 3/5 3/5  Wrist ulnar deviation    Wrist radial deviation    Wrist pronation    Wrist supination    (Blank  rows = not tested)  HAND FUNCTION: Grip strength: Right: 46.0, 61.5, 67.2  lbs; Left: 55.9, 60.4, 62.1  lbs Average:  Right: 58.2 lb Left: 59.5 lbs  COORDINATION: 9 Hole Peg test: Right: 22.67 sec; Left: 26.74 sec Box and Blocks:  Right 41 blocks, Left 43 blocks  SENSATION: Light touch: Impaired  - can notice distal phalange numbness and tingling  EDEMA: at night - especially after using tools  MUSCLE TONE: WFL  COGNITION: Overall cognitive status: Within functional limits for tasks assessed  VISION: Subjective report: Doesn't drive at night - see shadows of signs at night Baseline vision: Wears glasses all the time reading is harder but can watch TV without glasses Visual history: cataracts and floaters not r/t cataracts but not enough for surgery  VISION ASSESSMENT: Not tested  Patient has difficulty with following activities due to following visual impairments: crocheting and reading without glasses.  PERCEPTION: Not tested  PRAXIS: Not tested  OBSERVATIONS at Evaluation: Pt ambulates with MI and no use of AE with no loss of balance. The pt appears well kept and has an active lifestyle.  She has fairly good grip strength and coordination but with some tremors which tend to be worse in the evening and difficulty with sustained activity tolerance and safety ie) has broken 3 glass items lately.    TODAY'S TREATMENT:                                                                                                                               Self Care Education - Multiple areas of education provided to patient today:  Producer, television/film/video, Desensitization Patient program and Joint protection education.  Safety considerations for loss of sensation:   Look at affected hand when using it!   Do NOT use affected arm for anything: sharp, hot, breakable, or too heavy  Always check temperature of water (for showering, washing dishes, etc) with UNaffected arm/extremity  Consider travel  mugs w/ lids to transport hot liquids/coffee  Consider alternative options and/or adaptive equipment to make things safer (ex: hand chopper or cut resistant glove for chopping vegetables)   Avoid cold temperatures as well (wear glove in cold temperatures, get ice w/ unaffected extremity)  AVOID handling chemicals and machinery    DESENSITIZATION: Patient is able to report various textures she is using at home to help with stimulation and education provided on continuing to vary objects, textures, tasks and difficulty of sensory input as well as using visualization and positive talk during incorporation of sensory stimulation during different activities throughout the week   Examples of desensitization textures you can use: ? Brushing: use a hair brush or combing in circular or sweeping motions over the area ? Tapping: use your hand to tap or pat the area ? Towel rubs: use a dry towel and rub the area in circular or sweeping motions ? Massage: massage the area with your hands, may use lotion ? Vibration: apply home massager or other vibration tool over area ? Light touch: gently "tickle" the area with your fingertips  Education provided on applying different textures with different motions light stroking, firm stroking, tapping and circular motions according to what she can tolerate.    Using somatosensory feedback ie) telling self what the surface property is, looking at the texture and exploring the texture with intact areas.     Using mental imagery and verbal/mental feedback re: texture etc when she applies them to affected digits.  Joint protection    OT educated pt on joint protection, ergonomics, and Optometrist principles as noted in pt instructions as needed to improve UE pain.    Handout is provided regarding joint protection and patient is encouraged to consider the specific acronym "LESS" ie) less strain on joints  L: Listen to your body E: Energy Conservation S: Stronger  Joints take the Lead S: Strategize   Patient encouraged to protect hands and wrist by  - Respecting for Pain and stopping activities before they reach the point of discomfort or pain  - Rest and Work Balance ie) balancing activities with appropriate rests during activity - Reduction of Effort - Use two hands instead of one for carrying or lifting  - Use of Larger/Stronger Joints Ie) Lift or carry with the forearm or shoulder rather than fingers - Avoid Activities That Cannot Be Stopped - Use of Assistive Equipment - Consider splint use and equipment such as a jar opener to protect joints from deformity and stresses.  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   Then reviewed activities that can be modified with adaptive equipment and encourage patient to look adaptive equipment for arthritis [i.e. to consider joint protection] and/or for stroke patients [i.e. for one-handed use].  PATIENT EDUCATION: Education details: OT goals, putty exercises and Coordination tasks Person educated: Patient Education method: Explanation, Demonstration, Verbal cues, and Handouts Education comprehension: verbalized understanding, returned demonstration, verbal cues required, and needs further education  HOME EXERCISE PROGRAM: 04/18/23 - Putty Exercises  Access Code: [email or web address] & Coordination Activities with Images handout provided 05/02/23 - Sensory Safety, Desensitization Program, Joint Protection   GOALS: Goals reviewed with patient? Yes   SHORT TERM GOALS: Target date: 05/12/23  Patient will demonstrate UE coordination HEP with 25% verbal cues or less for proper execution.  Baseline: New to OT Goal status: IN Progress  2.  Patient will complete typing assessment to assess difficulties and assist with modifications for improved ease with typing. Baseline: Self reports typing is  getting worse. Goal status: INITIAL  3.  Patient will be aware of 3+ compensatory strategies for safety in the kitchen to prevent injury. Baseline: She has broken 3 glasses and a bowl ie) knocking them over or dropping item, in the past 2 weeks. Goal status: IN Progress  4.  Patient will complete nine-hole peg with use of BUE in 20-25 seconds or less with increased ease.  Baseline:  Right: 22.67 sec; Left: 26.74 sec with self reported awkwardness Goal status: IN Progress  LONG TERM GOALS: Target date: 05/29/23  Patient will demonstrate UE ROM/coordination and sensory HEP with MI with proper execution.  Baseline: New to OT Goal status: IN Progress  2. Patient will report no dropping of objects from B UE x 1 week. Baseline: She has broken 3 glasses and a bowl ie) knocking them over or dropping item, in the past 2 weeks. Goal status: In progress  3.  Patient will improve pinch strength/coordination & stabilization  of B hand to increase ease & independence in tying shoelaces, and managing buttons.  Baseline: Self reported difficulty. Goal status: IN Progress  4.  Improve touch sensation & calibration of grasp so patient can effectively grasp a paper/styrofoam cup w/o spilling and/or maintain hold of objects throughout home w/o slipping out of her hand. Baseline: Performing tasks bilaterally at eval for safety. Goal status: INITIAL   ASSESSMENT:  CLINICAL IMPRESSION: Patient is a 72 y.o. female who returns for occupational therapy treatment today for history of tremors, decreased coordination and sensory deficits. Patient education focussed on today ie) safety ideas to protect her hands, further desensitization ideas and joint protection. Pt will continue to benefit from skilled OT services in the outpatient setting to work on impairments to help pt return to max potential for UE awareness and motor skills.    PERFORMANCE DEFICITS: in functional skills including ADLs, IADLs, coordination,  dexterity, sensation, Fine motor control, decreased knowledge of use of DME, and UE functional use,   IMPAIRMENTS: are limiting patient from ADLs, IADLs, rest and sleep, and leisure.   CO-MORBIDITIES: has co-morbidities such as Raynaud's and h/o trigger finger surgeries and B elbow tennis/golfer's elbow  that affects occupational performance. Patient will benefit from skilled OT to address above impairments and improve overall function.  REHAB POTENTIAL: Good  PLAN:  OT FREQUENCY: 1x/week  OT DURATION: 6 weeks  PLANNED INTERVENTIONS: self care/ADL training, therapeutic exercise, therapeutic activity, neuromuscular re-education, electrical stimulation, ultrasound, fluidotherapy, patient/family education, visual/perceptual remediation/compensation, energy conservation, coping strategies training, and DME and/or AE instructions  RECOMMENDED OTHER SERVICES: NA  CONSULTED AND AGREED WITH PLAN OF CARE: Patient  PLAN FOR NEXT SESSION:   Picking up and moving objects to minimize dropping.  Typing assessment.  Patient to rate activities per PSFS for goal update.  Victorino Sparrow, OT 05/01/2023, 5:22 PM

## 2023-05-01 NOTE — Patient Instructions (Addendum)
  Safety considerations for loss of sensation:   Look at affected hand when using it!   Do NOT use affected arm for anything: sharp, hot, breakable, or too heavy  Always check temperature of water (for showering, washing dishes, etc) with UNaffected arm/extremity  Consider travel mugs w/ lids to transport hot liquids/coffee  Consider alternative options and/or adaptive equipment to make things safer (ex: hand chopper or cut resistant glove for chopping vegetables)   Avoid cold temperatures as well (wear glove in cold temperatures, get ice w/ unaffected extremity)  AVOID handling chemicals and machinery   

## 2023-05-02 ENCOUNTER — Other Ambulatory Visit (HOSPITAL_COMMUNITY): Payer: Self-pay

## 2023-05-02 DIAGNOSIS — R3915 Urgency of urination: Secondary | ICD-10-CM | POA: Diagnosis not present

## 2023-05-02 DIAGNOSIS — N3946 Mixed incontinence: Secondary | ICD-10-CM | POA: Diagnosis not present

## 2023-05-02 MED ORDER — MIRABEGRON ER 50 MG PO TB24
50.0000 mg | ORAL_TABLET | Freq: Every day | ORAL | 11 refills | Status: DC
  Filled 2023-05-02: qty 30, 30d supply, fill #0
  Filled 2023-07-20: qty 30, 30d supply, fill #1
  Filled 2023-10-31 – 2023-11-03 (×2): qty 30, 30d supply, fill #2

## 2023-05-08 ENCOUNTER — Other Ambulatory Visit (HOSPITAL_COMMUNITY): Payer: Self-pay

## 2023-05-08 ENCOUNTER — Ambulatory Visit: Payer: Medicare HMO | Admitting: Occupational Therapy

## 2023-05-08 DIAGNOSIS — R208 Other disturbances of skin sensation: Secondary | ICD-10-CM

## 2023-05-08 DIAGNOSIS — R29818 Other symptoms and signs involving the nervous system: Secondary | ICD-10-CM | POA: Diagnosis not present

## 2023-05-08 DIAGNOSIS — R278 Other lack of coordination: Secondary | ICD-10-CM | POA: Diagnosis not present

## 2023-05-08 DIAGNOSIS — R251 Tremor, unspecified: Secondary | ICD-10-CM | POA: Diagnosis not present

## 2023-05-08 NOTE — Therapy (Unsigned)
OUTPATIENT OCCUPATIONAL THERAPY NEURO TREATMENT  Patient Name: Stacey Lawrence MRN: [id number] DOB:[birth year], 72 y.o., female Today's Date: 05/08/2023  PCP: Benita Stabile, MD REFERRING PROVIDER: Shawnie Dapper, NP  END OF SESSION:  OT End of Session - 05/08/23 1536     Visit Number 4    Number of Visits 6   + evaluation   Date for OT Re-Evaluation 06/02/23    Authorization Type [employer]    OT Start Time 1535    OT Stop Time 1620    OT Time Calculation (min) 45 min    Equipment Utilized During Treatment Rice Bin/FM objects    Activity Tolerance Patient tolerated treatment well    Behavior During Therapy WFL for tasks assessed/performed              Past Medical History:  Diagnosis Date   Anemia    hx   Anxiety    CAD (coronary artery disease)    a. 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 05/2013 Lexi CL: EF 77%, old small inferolat scar, no ischemia.   CHF (congestive heart failure) (HCC)    Common migraine with intractable migraine 06/16/2017   Complication of anesthesia    dificulty waking up   Depression    Fibromyalgia    GERD (gastroesophageal reflux disease)    hasn't started taking any meds   History of blood transfusion    87yrs ago   History of kidney stones    Hx of colonic polyps    IBS (irritable bowel syndrome)    constipation   Joint pain    Joint swelling    Migraine    "q other week" (02/04/2016)   Myocardial infarction (HCC) 2014   Numbness    right leg   Polycythemia    PONV (postoperative nausea and vomiting)    Raynaud's disease    Sciatica of right side 04/21/2020   Scoliosis    Shortness of breath dyspnea    with exertion   SI (sacroiliac) joint dysfunction    right   Wears  glasses    Past Surgical History:  Procedure Laterality Date   ABDOMINAL HYSTERECTOMY     partial   APPENDECTOMY     with explor. lap.   BIOPSY  05/31/2022   Procedure: BIOPSY;  Surgeon: Dolores Frame, MD;  Location: [facility];  Service: Gastroenterology;;   CARDIAC CATHETERIZATION     CHOLECYSTECTOMY  10/28/2002   lap.   COLONOSCOPY     COLONOSCOPY WITH PROPOFOL N/A 08/11/2021   Procedure: COLONOSCOPY WITH PROPOFOL;  Surgeon: Malissa Hippo, MD;  Location: [facility];  Service: Endoscopy;  Laterality: N/A;  8:30   DEBRIDEMENT TENNIS ELBOW     DIAGNOSTIC LAPAROSCOPY     DILATION AND CURETTAGE OF UTERUS     ELBOW SURGERY     golfer's elbow-bilateral   ESOPHAGEAL DILATION N/A 05/31/2022   Procedure: ESOPHAGEAL DILATION;  Surgeon: Dolores Frame, MD;  Location: [facility];  Service: Gastroenterology;  Laterality: N/A;   ESOPHAGOGASTRODUODENOSCOPY  09/10/2012   Procedure: ESOPHAGOGASTRODUODENOSCOPY (EGD);  Surgeon: Malissa Hippo, MD;  Location: [facility];  Service: Endoscopy;  Laterality: N/A;  730  ESOPHAGOGASTRODUODENOSCOPY (EGD) WITH PROPOFOL N/A 05/31/2022   Procedure: ESOPHAGOGASTRODUODENOSCOPY (EGD) WITH PROPOFOL;  Surgeon: Dolores Frame, MD;  Location: [facility];  Service: Gastroenterology;  Laterality: N/A;  205 ASA 3   EXCISION/RELEASE BURSA HIP Left 02/04/2016   Procedure: LEFT HIP TROCHANTERIC BURSECTOMY;  Surgeon: Kathryne Hitch, MD;  Location: [facility] OR;  Service: Orthopedics;  Laterality: Left;   I & D EXTREMITY  02/25/2012   Procedure: IRRIGATION AND DEBRIDEMENT EXTREMITY;  Surgeon: Dominica Severin, MD;  Location: [facility] OR;  Service: Orthopedics;  Laterality: Right;  Irrigation and Debridement and Repair Right Thumb Nerve Laceration and Assoiated Structures    KNEE DEBRIDEMENT     right   LAPAROTOMY     LEFT HEART CATHETERIZATION WITH CORONARY ANGIOGRAM N/A 01/16/2013   Procedure: LEFT HEART CATHETERIZATION WITH  CORONARY ANGIOGRAM;  Surgeon: Dolores Patty, MD;  Location: [facility] CATH LAB;  Service: Cardiovascular;  Laterality: N/A;   POLYPECTOMY  08/11/2021   Procedure: POLYPECTOMY;  Surgeon: Malissa Hippo, MD;  Location: [facility];  Service: Endoscopy;;   SACROILIAC JOINT FUSION Right 05/02/2019   Procedure: SACROILIAC JOINT FUSION;  Surgeon: Venita Lick, MD;  Location: [facility] OR;  Service: Orthopedics;  Laterality: Right;  SACROILIAC JOINT FUSION   STAPEDECTOMY     right   TONSILLECTOMY AND ADENOIDECTOMY     TRIGGER FINGER RELEASE  08/19/2011   Procedure: RELEASE TRIGGER FINGER/A-1 PULLEY;  Surgeon: Oletta Cohn III;  Location: [facility];  Service: Orthopedics;  Laterality: Right;  right middle finger a-1 pulley release with tenosynovectomy   TRIGGER FINGER RELEASE     x 5   TROCHANTERIC BURSA EXCISION Right 02/04/2016   TUBAL LIGATION     TYMPANOPLASTY     Patient Active Problem List   Diagnosis Date Noted   Abdominal pain, chronic, epigastric 08/08/2022   Lower abdominal pain 08/08/2022   Loss of weight 05/05/2022   Dysphagia 05/05/2022   Gait abnormality 08/03/2021   Irritable bowel syndrome (IBS) 07/13/2021   Sciatica of right side 04/21/2020   Acquired short Achilles tendon, right 07/03/2019   Microvascular angina 01/01/2019   Unstable angina (HCC)    Common migraine with intractable migraine 06/16/2017   Tremor 11/16/2016   Trochanteric bursitis of left hip 02/04/2016   Trochanteric bursitis of both hips 10/31/2015   Chest pain at rest 06/17/2015   Inferior myocardial infarction [facility]) 06/17/2015   Sprain of interphalangeal joint of left little finger 02/05/2014   Pain in joint, hand 02/05/2014   Midsternal chest pain 06/04/2013   CAD (coronary artery disease)    Fibromyalgia    Gastroesophageal reflux disease    Migraine headache    Acute non-STEMI < 8 weeks prior, subsequent admission after initial 01/16/2013   Radial nerve laceration 03/27/2012    Lack of coordination 03/27/2012   Muscle weakness (generalized) 03/27/2012    ONSET DATE: 03/29/2023  REFERRING DIAG: G25.0 (ICD-10-CM) - Essential tremor  THERAPY DIAG:  Other disturbances of skin sensation  Tremors of nervous system  Other symptoms and signs involving the nervous system  Rationale for Evaluation and Treatment: Rehabilitation  SUBJECTIVE:   SUBJECTIVE STATEMENT: Patient reports she had bad tremors earlier and upon questioning she did confirm being stressed about her car issues today. Sometimes take Tylenol or Advil for tremors  Can sand with vibrating sander.  Patient has continued with resumed Gabapentin (x10 days) with no significant change in how her feet fee.  They even felt like they were on  fire/burning upon arrival to therapy office and putting them on cold floor feels better.  She has taken to decreasing temperature of dish water to help with how her hands feel.  Patient does report ongoing comfort and relief for her hands through use of different gloves for work and hemp gloves for a couple of hours in the evening.  Pt accompanied by: self  PERTINENT HISTORY:   Patient reports 22 surgeries in her lifetime, B tennis/golf surgery repairs, Trigger finger surgeries - Both thumbs, both middle fingers & R thumb surgery where she cut the tendons, Fibromyalgia x20 years, Raynaud's syndrome and Sjgren's syndrome (extensive dryness) x 30 years  PRECAUTIONS: None  WEIGHT BEARING RESTRICTIONS: No  PAIN:  Are you having pain? Yes: NPRS scale: 9 (same in feet) /10 Pain location: back and R hip (last lumbar disc in need of surgical repair) Pain description: sharp pains down her leg Aggravating factors: standing and moving (has done PT in the past) Relieving factors: Advil, ice pack, move and then sit  Has made her own ice pack 1/4 cup alcohol to 1 cup water for slushy.  FALLS: Has patient fallen in last 6 months? 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  Tremors come and go - they are worse at night when she is tired. She has broken 3 glasses and a bowl ie) knocking them over or dropping item, in the past 2 weeks. No strength. Typing is getting worse.  OBJECTIVE:   HAND DOMINANCE: Right  ADLs: Overall ADLs: Independent Transfers/ambulation related to ADLs: I Eating: Harder to cut stuff recently Grooming: I UB Dressing: I but starting to have trouble with little buttons LB Dressing: I Toileting: I  Bathing: I  Tub Shower transfers: I Equipment: Shower seat with back  IADLs: Shopping: Still driving (vehicle, tractor, Health and safety inspector) Light housekeeping: Mopping hurts Meal Prep: MI - trouble cutting raw meats, she has found 2 knives that work well for her Community mobility: I/MI with walking stick at times Medication management: I with pill bottles Financial management: Husband does finances Handwriting: 75% legible  MOBILITY STATUS: Independent  POSTURE COMMENTS:  forward head Sitting balance: WFL  ACTIVITY TOLERANCE: Activity tolerance: Good  FUNCTIONAL OUTCOME MEASURES: TBD  UPPER EXTREMITY ROM:    Active ROM Right eval Left eval  Shoulder flexion WFL  Shoulder abduction   Shoulder adduction   Shoulder extension   Shoulder internal rotation   Shoulder external rotation   Elbow flexion   Elbow extension   Wrist flexion   Wrist extension   Wrist ulnar deviation   Wrist radial deviation   Wrist pronation   Wrist supination   (Blank rows = not tested)  UPPER EXTREMITY MMT:     MMT Right eval Left eval  Shoulder flexion    Shoulder abduction     Shoulder adduction    Shoulder extension    Shoulder internal rotation    Shoulder external rotation    Middle trapezius    Lower trapezius    Elbow flexion    Elbow extension    Wrist flexion 3+/5 3+/5  Wrist extension  3/5 3/5  Wrist ulnar deviation    Wrist radial deviation    Wrist pronation    Wrist supination    (Blank rows = not tested)  HAND FUNCTION: Grip strength: Right: 46.0, 61.5, 67.2  lbs; Left: 55.9, 60.4, 62.1  lbs Average: Right: 58.2 lb Left: 59.5 lbs  COORDINATION: 9 Hole Peg test: Right: 22.67 sec; Left: 26.74 sec Box and Blocks:  Right 41 blocks, Left 43 blocks  SENSATION: Light touch: Impaired  - can notice distal phalange numbness and tingling  EDEMA: at night - especially after using tools  MUSCLE TONE: WFL  COGNITION: Overall cognitive status: Within functional limits for tasks assessed  VISION: Subjective report: Doesn't drive at night - see shadows of signs at night Baseline vision: Wears glasses all the time reading is harder but can watch TV without glasses Visual history: cataracts and floaters not r/t cataracts but not enough for surgery  VISION ASSESSMENT: Not tested  Patient has difficulty with following activities due to following visual impairments: crocheting and reading without glasses.  PERCEPTION: Not tested  PRAXIS: Not tested  OBSERVATIONS at Evaluation: Pt ambulates with MI and no use of AE with no loss of balance. The pt appears well kept and has an active lifestyle.  She has fairly good grip strength and coordination but with some tremors which tend to be worse in the evening and difficulty with sustained activity tolerance and safety ie) has broken 3 glass items lately.    TODAY'S TREATMENT:                                                                                                                               Demonstrated Rice bin activity to help with stimulation of her hands and continued desensitization with  suggestions of additional alternatives at home ie) dry beans, macaroni.  She is encouraged to use both hands to find objects hidden in the rice and encouraged to try and identify objects or describe them with vision occluded with fairly good success at identifying key features of items such as block, peg, checker, screw, etc. Although she had difficulty identifying objects without seeing them at first, he could match them appropriately.  She continues to be encouraged to use visualization, verbal feedback/positive talk to help with awareness of different features of objects ie) round, square, spiky, rubbery, cool etc.    Stereognosis activities also demonstrated to progress HEPs for sensory stimulation and awareness of similar objects.  She did well with 4 different block objects on R side x 100%, L side x 50% but struggled with 4 different disc/penny sized objects 25% success.  Pt. worked on [facility] skills grasping small 1/4" pegs, performing translatory movements moving each peg through the hand from the palm to the tip of the 2nd digit and thumb in preparation for placing them in the pegboard following a design pattern with minimal dropping of objects.  PATIENT EDUCATION: Education details: Arts administrator  educated: Patient Education method: Explanation, Demonstration, and Verbal cues Education comprehension: verbalized understanding, returned demonstration, and verbal cues required  HOME EXERCISE PROGRAM: 04/18/23 - Putty Exercises  Access Code: [email or web address] & Coordination Activities with Images handout provided 05/02/23 - Sensory Safety, Desensitization Program, Joint Protection   GOALS: Goals reviewed with patient? Yes   SHORT TERM GOALS: Target date: 05/12/23  Patient will demonstrate UE coordination HEP with 25% verbal cues or less for proper execution.  Baseline: New to OT Goal status: IN Progress  2.  Patient will complete typing assessment to assess difficulties and assist with  modifications for improved ease with typing. Baseline: Self reports typing is getting worse. Goal status: INITIAL  3.  Patient will be aware of 3+ compensatory strategies for safety in the kitchen to prevent injury. Baseline: She has broken 3 glasses and a bowl ie) knocking them over or dropping item, in the past 2 weeks. Goal status: IN Progress  4.  Patient will complete nine-hole peg with use of BUE in 20-25 seconds or less with increased ease.  Baseline:  Right: 22.67 sec; Left: 26.74 sec with self reported awkwardness Goal status: IN Progress  LONG TERM GOALS: Target date: 05/29/23  Patient will demonstrate UE ROM/coordination and sensory HEP with MI with proper execution.  Baseline: New to OT Goal status: IN Progress  2. Patient will report no dropping of objects from B UE x 1 week. Baseline: She has broken 3 glasses and a bowl ie) knocking them over or dropping item, in the past 2 weeks. Goal status: In progress  3.  Patient will improve pinch strength/coordination & stabilization of B hand to increase ease & independence in tying shoelaces, and managing buttons.  Baseline: Self reported difficulty. Goal status: IN Progress  4.  Improve touch sensation & calibration of grasp so patient can effectively grasp a paper/styrofoam cup w/o spilling and/or maintain hold of objects throughout home w/o slipping out of her hand. Baseline: Performing tasks bilaterally at eval for safety. Goal status: INITIAL   ASSESSMENT:  CLINICAL IMPRESSION: Patient is a 72 y.o. female who returns for occupational therapy treatment today with increased tremors today but fairly good compensatory motions ie) stabilized on table top, decreased coordination and sensory deficits. Patient education focussed on today ie) safety ideas to protect her hands, further desensitization ideas and joint protection. Pt will continue to benefit from skilled OT services in the outpatient setting to work on impairments to  help pt return to max potential for UE awareness and motor skills.    PERFORMANCE DEFICITS: in functional skills including ADLs, IADLs, coordination, dexterity, sensation, Fine motor control, decreased knowledge of use of DME, and UE functional use,   IMPAIRMENTS: are limiting patient from ADLs, IADLs, rest and sleep, and leisure.   CO-MORBIDITIES: has co-morbidities such as Raynaud's and h/o trigger finger surgeries and B elbow tennis/golfer's elbow  that affects occupational performance. Patient will benefit from skilled OT to address above impairments and improve overall function.  REHAB POTENTIAL: Good  PLAN:  OT FREQUENCY: 1x/week  OT DURATION: 6 weeks  PLANNED INTERVENTIONS: self care/ADL training, therapeutic exercise, therapeutic activity, neuromuscular re-education, electrical stimulation, ultrasound, fluidotherapy, patient/family education, visual/perceptual remediation/compensation, energy conservation, coping strategies training, and DME and/or AE instructions  RECOMMENDED OTHER SERVICES: NA  CONSULTED AND AGREED WITH PLAN OF CARE: Patient  PLAN FOR NEXT SESSION:   Picking up and moving objects to minimize dropping.  Typing assessment.  Patient to rate activities per PSFS for goal update.  Marylu Lund  Pricilla Holm, OT 05/08/2023, 6:15 PM

## 2023-05-15 ENCOUNTER — Ambulatory Visit: Payer: Medicare HMO | Admitting: Occupational Therapy

## 2023-05-15 DIAGNOSIS — R29818 Other symptoms and signs involving the nervous system: Secondary | ICD-10-CM

## 2023-05-15 DIAGNOSIS — R208 Other disturbances of skin sensation: Secondary | ICD-10-CM

## 2023-05-15 DIAGNOSIS — R278 Other lack of coordination: Secondary | ICD-10-CM | POA: Diagnosis not present

## 2023-05-15 DIAGNOSIS — R251 Tremor, unspecified: Secondary | ICD-10-CM | POA: Diagnosis not present

## 2023-05-15 NOTE — Therapy (Signed)
OUTPATIENT OCCUPATIONAL THERAPY NEURO TREATMENT  Patient Name: Stacey Lawrence MRN: [id number] DOB:09-05-1951, 72 y.o., female Today's Date: 05/15/2023  PCP: Benita Stabile, MD REFERRING PROVIDER: Shawnie Dapper, NP  END OF SESSION:  OT End of Session - 05/15/23 1534     Visit Number 5    Number of Visits 6   + evaluation   Date for OT Re-Evaluation 06/02/23    Authorization Type [employer]    OT Start Time 1534    OT Stop Time 1619    OT Time Calculation (min) 45 min    Equipment Utilized During Treatment FM objects    Activity Tolerance Patient tolerated treatment well    Behavior During Therapy WFL for tasks assessed/performed              Past Medical History:  Diagnosis Date   Anemia    hx   Anxiety    CAD (coronary artery disease)    a. 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 05/2013 Lexi CL: EF 77%, old small inferolat scar, no ischemia.   CHF (congestive heart failure) (HCC)    Common migraine with intractable migraine 06/16/2017   Complication of anesthesia    dificulty waking up   Depression    Fibromyalgia    GERD (gastroesophageal reflux disease)    hasn't started taking any meds   History of blood transfusion    24yrs ago   History of kidney stones    Hx of colonic polyps    IBS (irritable bowel syndrome)    constipation   Joint pain    Joint swelling    Migraine    "q other week" (02/04/2016)   Myocardial infarction (HCC) 2014   Numbness    right leg   Polycythemia    PONV (postoperative nausea and vomiting)    Raynaud's disease    Sciatica of right side 04/21/2020   Scoliosis    Shortness of breath dyspnea    with exertion   SI (sacroiliac) joint dysfunction    right   Wears glasses     Past Surgical History:  Procedure Laterality Date   ABDOMINAL HYSTERECTOMY     partial   APPENDECTOMY     with explor. lap.   BIOPSY  05/31/2022   Procedure: BIOPSY;  Surgeon: Dolores Frame, MD;  Location: [facility];  Service: Gastroenterology;;   CARDIAC CATHETERIZATION     CHOLECYSTECTOMY  10/28/2002   lap.   COLONOSCOPY     COLONOSCOPY WITH PROPOFOL N/A 08/11/2021   Procedure: COLONOSCOPY WITH PROPOFOL;  Surgeon: Malissa Hippo, MD;  Location: [facility];  Service: Endoscopy;  Laterality: N/A;  8:30   DEBRIDEMENT TENNIS ELBOW     DIAGNOSTIC LAPAROSCOPY     DILATION AND CURETTAGE OF UTERUS     ELBOW SURGERY     golfer's elbow-bilateral   ESOPHAGEAL DILATION N/A 05/31/2022   Procedure: ESOPHAGEAL DILATION;  Surgeon: Dolores Frame, MD;  Location: [facility];  Service: Gastroenterology;  Laterality: N/A;   ESOPHAGOGASTRODUODENOSCOPY  09/10/2012   Procedure: ESOPHAGOGASTRODUODENOSCOPY (EGD);  Surgeon: Malissa Hippo, MD;  Location: [facility];  Service: Endoscopy;  Laterality: N/A;  730  ESOPHAGOGASTRODUODENOSCOPY (EGD) WITH PROPOFOL N/A 05/31/2022   Procedure: ESOPHAGOGASTRODUODENOSCOPY (EGD) WITH PROPOFOL;  Surgeon: Dolores Frame, MD;  Location: [facility];  Service: Gastroenterology;  Laterality: N/A;  205 ASA 3   EXCISION/RELEASE BURSA HIP Left 02/04/2016   Procedure: LEFT HIP TROCHANTERIC BURSECTOMY;  Surgeon: Kathryne Hitch, MD;  Location: [facility] OR;  Service: Orthopedics;  Laterality: Left;   I & D EXTREMITY  02/25/2012   Procedure: IRRIGATION AND DEBRIDEMENT EXTREMITY;  Surgeon: Dominica Severin, MD;  Location: [facility] OR;  Service: Orthopedics;  Laterality: Right;  Irrigation and Debridement and Repair Right Thumb Nerve Laceration and Assoiated Structures    KNEE DEBRIDEMENT     right   LAPAROTOMY     LEFT HEART CATHETERIZATION WITH CORONARY ANGIOGRAM N/A 01/16/2013   Procedure: LEFT HEART CATHETERIZATION WITH CORONARY  ANGIOGRAM;  Surgeon: Dolores Patty, MD;  Location: [facility];  Service: Cardiovascular;  Laterality: N/A;   POLYPECTOMY  08/11/2021   Procedure: POLYPECTOMY;  Surgeon: Malissa Hippo, MD;  Location: [facility];  Service: Endoscopy;;   SACROILIAC JOINT FUSION Right 05/02/2019   Procedure: SACROILIAC JOINT FUSION;  Surgeon: Venita Lick, MD;  Location: [facility] OR;  Service: Orthopedics;  Laterality: Right;  SACROILIAC JOINT FUSION   STAPEDECTOMY     right   TONSILLECTOMY AND ADENOIDECTOMY     TRIGGER FINGER RELEASE  08/19/2011   Procedure: RELEASE TRIGGER FINGER/A-1 PULLEY;  Surgeon: Oletta Cohn III;  Location: [facility];  Service: Orthopedics;  Laterality: Right;  right middle finger a-1 pulley release with tenosynovectomy   TRIGGER FINGER RELEASE     x 5   TROCHANTERIC BURSA EXCISION Right 02/04/2016   TUBAL LIGATION     TYMPANOPLASTY     Patient Active Problem List   Diagnosis Date Noted   Abdominal pain, chronic, epigastric 08/08/2022   Lower abdominal pain 08/08/2022   Loss of weight 05/05/2022   Dysphagia 05/05/2022   Gait abnormality 08/03/2021   Irritable bowel syndrome (IBS) 07/13/2021   Sciatica of right side 04/21/2020   Acquired short Achilles tendon, right 07/03/2019   Microvascular angina 01/01/2019   Unstable angina (HCC)    Common migraine with intractable migraine 06/16/2017   Tremor 11/16/2016   Trochanteric bursitis of left hip 02/04/2016   Trochanteric bursitis of both hips 10/31/2015   Chest pain at rest 06/17/2015   Inferior myocardial infarction [facility]) 06/17/2015   Sprain of interphalangeal joint of left little finger 02/05/2014   Pain in joint, hand 02/05/2014   Midsternal chest pain 06/04/2013   CAD (coronary artery disease)    Fibromyalgia    Gastroesophageal reflux disease    Migraine headache    Acute non-STEMI < 8 weeks prior, subsequent admission after initial 01/16/2013   Radial nerve laceration 03/27/2012   Lack of  coordination 03/27/2012   Muscle weakness (generalized) 03/27/2012    ONSET DATE: 03/29/2023  REFERRING DIAG: G25.0 (ICD-10-CM) - Essential tremor  THERAPY DIAG:  Other lack of coordination  Tremors of nervous system  Other symptoms and signs involving the nervous system  Other disturbances of skin sensation  Rationale for Evaluation and Treatment: Rehabilitation  SUBJECTIVE:   SUBJECTIVE STATEMENT:   Patient reports that she has had some family stresses - granddaughter and mother in law were taken to the hospital.  She was so upset that it made her stress high and more tremors.  She took some Aleve but has been having ongoing trouble with her hip s/p a misstep a  couple of week ago.   Pt accompanied by: self  PERTINENT HISTORY:   Patient reports 22 surgeries in her lifetime, B tennis/golf surgery repairs, Trigger finger surgeries - Both thumbs, both middle fingers & R thumb surgery where she cut the tendons, Fibromyalgia x20 years, Raynaud's syndrome and Sjgren's syndrome (extensive dryness) x 30 years  PRECAUTIONS: None  WEIGHT BEARING RESTRICTIONS: No  PAIN:  Are you having pain? Yes: NPRS scale: 8 (same in feet) /10 Pain location: back and R hip (last lumbar disc in need of surgical repair) Pain description: sharp pains down her leg Aggravating factors: standing and moving (has done PT in the past) Relieving factors: Advil, ice pack, move and then sit  Has made her own ice pack 1/4 cup alcohol to 1 cup water for slushy.  Hands are achy and swelling at night  FALLS: Has patient fallen in last 6 months? 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  Tremors come and go - they are worse at night when she is tired. She has broken 3 glasses and a bowl ie) knocking them over or dropping item, in the past 2 weeks. No strength. Typing is getting worse.  OBJECTIVE:   HAND DOMINANCE: Right  ADLs: Overall ADLs: Independent Transfers/ambulation related to ADLs: I Eating: Harder to cut stuff recently Grooming: I UB Dressing: I but starting to have trouble with little buttons LB Dressing: I Toileting: I  Bathing: I  Tub Shower transfers: I Equipment: Shower seat with back  IADLs: Shopping: Still driving (vehicle, tractor, Health and safety inspector) Light housekeeping: Mopping hurts Meal Prep: MI - trouble cutting raw meats, she has found 2 knives that work well for her Community mobility: I/MI with walking stick at times Medication management: I with pill bottles Financial management: Husband does finances Handwriting: 75% legible  MOBILITY STATUS: Independent  POSTURE COMMENTS:  forward head Sitting balance: WFL  ACTIVITY TOLERANCE: Activity tolerance: Good  FUNCTIONAL OUTCOME MEASURES: TBD  UPPER EXTREMITY ROM:    Active ROM Right eval Left eval  Shoulder flexion WFL  Shoulder abduction   Shoulder adduction   Shoulder extension   Shoulder internal rotation   Shoulder external rotation   Elbow flexion   Elbow extension   Wrist flexion   Wrist extension   Wrist ulnar deviation   Wrist radial deviation   Wrist pronation   Wrist supination   (Blank rows = not tested)  UPPER EXTREMITY MMT:     MMT Right eval Left eval  Shoulder flexion    Shoulder abduction    Shoulder adduction    Shoulder extension    Shoulder internal rotation    Shoulder external rotation    Middle trapezius    Lower trapezius    Elbow flexion    Elbow extension    Wrist flexion 3+/5 3+/5  Wrist extension 3/5 3/5  Wrist ulnar deviation    Wrist radial deviation    Wrist pronation    Wrist supination     (Blank rows = not tested)  HAND FUNCTION: Grip strength: Right: 46.0, 61.5, 67.2  lbs; Left: 55.9, 60.4, 62.1  lbs  Average: Right: 58.2 lb Left: 59.5 lbs  COORDINATION: 9 Hole Peg test: Right: 22.67 sec; Left: 26.74 sec Box and Blocks:  Right 41 blocks, Left 43 blocks  SENSATION: Light touch: Impaired  - can notice distal phalange numbness and tingling  EDEMA: at night - especially after using tools  MUSCLE TONE: WFL  COGNITION: Overall cognitive status: Within functional limits for tasks assessed  VISION: Subjective report: Doesn't drive at night - see shadows of signs at night Baseline vision: Wears glasses all the time reading is harder but can watch TV without glasses Visual history: cataracts and floaters not r/t cataracts but not enough for surgery  VISION ASSESSMENT: Not tested  Patient has difficulty with following activities due to following visual impairments: crocheting and reading without glasses.  PERCEPTION: Not tested  PRAXIS: Not tested  OBSERVATIONS at Evaluation: Pt ambulates with MI and no use of AE with no loss of balance. The pt appears well kept and has an active lifestyle.  She has fairly good grip strength and coordination but with some tremors which tend to be worse in the evening and difficulty with sustained activity tolerance and safety ie) has broken 3 glass items lately.    TODAY'S TREATMENT:                                                                                                                               Therapeutic Activities: Patient engaged in retesting with 9 hole peg test.  Both initial attempts were completed in more time than at eval.  With strategic instruction and purposeful grasp of pegs below her fingertips (which are numb), patient is able to perform task in timely manner with goal met (ie) 3-5 second improvement on both sides).  Reviewed ongoing re sensitization ideas with patient reporting having a rice and now a dry  bean container at home.   Self care: Reviewed joint protection ideas as patient currently sleeps in fetal position.  Handout needs to be located but education provided on sleep with arms stretched over a pillow, possible use of wrist splint and working to minimize tight fist/hand position.  Reviewed safety ideas with patient confirming use of strategies such as looking at what she is picking up, using 2 hands as needed, and continuing to work on tasks that work her coordination and strength ie) cleaning tiles form her crafting activities etc.  PATIENT EDUCATION: Education details: Introduced sleep position recommendations. Person educated: Patient Education method: Explanation and Verbal cues - NEEDS Handouts Education comprehension: verbalized understanding, verbal cues required, and needs further education  HOME EXERCISE PROGRAM: 04/18/23 - Putty Exercises  Access Code: [email or web address] & Coordination Activities with Images handout provided 05/02/23 - Sensory Safety, Desensitization Program, Joint Protection   GOALS: Goals reviewed with patient? Yes   SHORT TERM GOALS: Target date: 05/12/23  Patient will demonstrate UE coordination HEP with 25% verbal cues or less for proper execution.  Baseline: New to OT Goal status:  IN Progress  2.  Patient will complete typing assessment to assess difficulties and assist with modifications for improved ease with typing. Baseline: Self reports typing is getting worse. Goal status: Goal discontinued - minimal typing needed now. Patient verbalizes text messages  3.  Patient will be aware of 3+ compensatory strategies for safety in the kitchen to prevent injury. Baseline: She has broken 3 glasses and a bowl ie) knocking them over or dropping item, in the past 2 weeks. Goal status: IN Progress  4.  Patient will complete nine-hole peg with use of BUE in 20-25 seconds or less with increased ease.  Baseline:  Right: 22.67 sec; Left: 26.74 sec with self  reported awkwardness Goal status: MET 05/15/23 Right 19.47 sec  Left 21.86 Strategic with grasp ie) lower on finger rather than tipss   LONG TERM GOALS: Target date: 05/29/23  Patient will demonstrate UE ROM/coordination and sensory HEP with MI with proper execution.  Baseline: New to OT Goal status: IN Progress  2. Patient will report no dropping of objects from B UE x 1 week. Baseline: She has broken 3 glasses and a bowl ie) knocking them over or dropping item, in the past 2 weeks. Goal status: In progress 05/15/23 -   3.  Patient will improve pinch strength/coordination & stabilization of B hand to increase ease & independence in tying shoelaces, and managing buttons.  Baseline: Self reported difficulty. Goal status: IN Progress 05/15/23 - Button assist education reviewed, pt uses slip on shoes now  4.  Improve touch sensation & calibration of grasp so patient can effectively grasp a paper/styrofoam cup w/o spilling and/or maintain hold of objects throughout home w/o slipping out of her hand. Baseline: Performing tasks bilaterally at eval for safety. Goal status: INITIAL   ASSESSMENT:  CLINICAL IMPRESSION: Patient is a 72 y.o. female who returns for occupational therapy treatment today with overall improvements in B UE coordination with strategic grasp ie) below fingertips which are numb and with strategic sequence of tasks to avoid bumping pegs etc, she had 3-5 second improvement in task without significant tremors today. Patient education focussed on again today ie) joint protection, sleep positions, possible splint options and, further desensitization ideas. Pt will continue to benefit from skilled OT services in the outpatient setting to work on impairments to help pt return to max potential for UE awareness and motor skills.    PERFORMANCE DEFICITS: in functional skills including ADLs, IADLs, coordination, dexterity, sensation, Fine motor control, decreased knowledge of use of DME,  and UE functional use,   IMPAIRMENTS: are limiting patient from ADLs, IADLs, rest and sleep, and leisure.   CO-MORBIDITIES: has co-morbidities such as Raynaud's and h/o trigger finger surgeries and B elbow tennis/golfer's elbow  that affects occupational performance. Patient will benefit from skilled OT to address above impairments and improve overall function.  REHAB POTENTIAL: Good  PLAN:  OT FREQUENCY: 1x/week  OT DURATION: 6 weeks  PLANNED INTERVENTIONS: self care/ADL training, therapeutic exercise, therapeutic activity, neuromuscular re-education, electrical stimulation, ultrasound, fluidotherapy, patient/family education, visual/perceptual remediation/compensation, energy conservation, coping strategies training, and DME and/or AE instructions  RECOMMENDED OTHER SERVICES: NA  CONSULTED AND AGREED WITH PLAN OF CARE: Patient  PLAN FOR NEXT SESSION:   Sleep handout.  Picking up and moving objects to minimize dropping ie) stack small blocks, grooved pegboard  Victorino Sparrow, OT 05/15/2023, 5:33 PM

## 2023-05-24 ENCOUNTER — Ambulatory Visit: Payer: Medicare HMO | Attending: Family Medicine | Admitting: Occupational Therapy

## 2023-05-24 DIAGNOSIS — R208 Other disturbances of skin sensation: Secondary | ICD-10-CM | POA: Insufficient documentation

## 2023-05-24 DIAGNOSIS — R278 Other lack of coordination: Secondary | ICD-10-CM | POA: Diagnosis not present

## 2023-05-24 DIAGNOSIS — R29818 Other symptoms and signs involving the nervous system: Secondary | ICD-10-CM | POA: Diagnosis not present

## 2023-05-24 DIAGNOSIS — M6281 Muscle weakness (generalized): Secondary | ICD-10-CM | POA: Diagnosis not present

## 2023-05-24 DIAGNOSIS — R251 Tremor, unspecified: Secondary | ICD-10-CM | POA: Diagnosis not present

## 2023-05-24 NOTE — Therapy (Unsigned)
OUTPATIENT OCCUPATIONAL THERAPY NEURO TREATMENT  Patient Name: Stacey Lawrence MRN: [id number] DOB:Jul 11, 1951, 72 y.o., female Today's Date: 05/24/2023  PCP: Benita Stabile, MD REFERRING PROVIDER: Shawnie Dapper, NP  END OF SESSION:  OT End of Session - 05/24/23 1535     Visit Number 6    Number of Visits 7   includng evaluation   Date for OT Re-Evaluation 06/02/23    Authorization Type [employer]    OT Start Time 1533    OT Stop Time 1618    OT Time Calculation (min) 45 min    Equipment Utilized During Treatment FM objects    Activity Tolerance Patient tolerated treatment well    Behavior During Therapy WFL for tasks assessed/performed              Past Medical History:  Diagnosis Date   Anemia    hx   Anxiety    CAD (coronary artery disease)    a. 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 05/2013 Lexi CL: EF 77%, old small inferolat scar, no ischemia.   CHF (congestive heart failure) (HCC)    Common migraine with intractable migraine 06/16/2017   Complication of anesthesia    dificulty waking up   Depression    Fibromyalgia    GERD (gastroesophageal reflux disease)    hasn't started taking any meds   History of blood transfusion    47yrs ago   History of kidney stones    Hx of colonic polyps    IBS (irritable bowel syndrome)    constipation   Joint pain    Joint swelling    Migraine    "q other week" (02/04/2016)   Myocardial infarction (HCC) 2014   Numbness    right leg   Polycythemia    PONV (postoperative nausea and vomiting)    Raynaud's disease    Sciatica of right side 04/21/2020   Scoliosis    Shortness of breath dyspnea    with exertion   SI (sacroiliac) joint dysfunction    right   Wears glasses     Past Surgical History:  Procedure Laterality Date   ABDOMINAL HYSTERECTOMY     partial   APPENDECTOMY     with explor. lap.   BIOPSY  05/31/2022   Procedure: BIOPSY;  Surgeon: Dolores Frame, MD;  Location: [facility];  Service: Gastroenterology;;   CARDIAC CATHETERIZATION     CHOLECYSTECTOMY  10/28/2002   lap.   COLONOSCOPY     COLONOSCOPY WITH PROPOFOL N/A 08/11/2021   Procedure: COLONOSCOPY WITH PROPOFOL;  Surgeon: Malissa Hippo, MD;  Location: [facility];  Service: Endoscopy;  Laterality: N/A;  8:30   DEBRIDEMENT TENNIS ELBOW     DIAGNOSTIC LAPAROSCOPY     DILATION AND CURETTAGE OF UTERUS     ELBOW SURGERY     golfer's elbow-bilateral   ESOPHAGEAL DILATION N/A 05/31/2022   Procedure: ESOPHAGEAL DILATION;  Surgeon: Dolores Frame, MD;  Location: [facility];  Service: Gastroenterology;  Laterality: N/A;   ESOPHAGOGASTRODUODENOSCOPY  09/10/2012   Procedure: ESOPHAGOGASTRODUODENOSCOPY (EGD);  Surgeon: Malissa Hippo, MD;  Location: [facility];  Service: Endoscopy;  Laterality: N/A;  730  ESOPHAGOGASTRODUODENOSCOPY (EGD) WITH PROPOFOL N/A 05/31/2022   Procedure: ESOPHAGOGASTRODUODENOSCOPY (EGD) WITH PROPOFOL;  Surgeon: Dolores Frame, MD;  Location: [facility];  Service: Gastroenterology;  Laterality: N/A;  205 ASA 3   EXCISION/RELEASE BURSA HIP Left 02/04/2016   Procedure: LEFT HIP TROCHANTERIC BURSECTOMY;  Surgeon: Kathryne Hitch, MD;  Location: [facility] OR;  Service: Orthopedics;  Laterality: Left;   I & D EXTREMITY  02/25/2012   Procedure: IRRIGATION AND DEBRIDEMENT EXTREMITY;  Surgeon: Dominica Severin, MD;  Location: [facility] OR;  Service: Orthopedics;  Laterality: Right;  Irrigation and Debridement and Repair Right Thumb Nerve Laceration and Assoiated Structures    KNEE DEBRIDEMENT     right   LAPAROTOMY     LEFT HEART CATHETERIZATION WITH CORONARY ANGIOGRAM N/A 01/16/2013   Procedure: LEFT HEART CATHETERIZATION WITH CORONARY  ANGIOGRAM;  Surgeon: Dolores Patty, MD;  Location: [facility] CATH LAB;  Service: Cardiovascular;  Laterality: N/A;   POLYPECTOMY  08/11/2021   Procedure: POLYPECTOMY;  Surgeon: Malissa Hippo, MD;  Location: [facility];  Service: Endoscopy;;   SACROILIAC JOINT FUSION Right 05/02/2019   Procedure: SACROILIAC JOINT FUSION;  Surgeon: Venita Lick, MD;  Location: [facility];  Service: Orthopedics;  Laterality: Right;  SACROILIAC JOINT FUSION   STAPEDECTOMY     right   TONSILLECTOMY AND ADENOIDECTOMY     TRIGGER FINGER RELEASE  08/19/2011   Procedure: RELEASE TRIGGER FINGER/A-1 PULLEY;  Surgeon: Oletta Cohn III;  Location: [facility];  Service: Orthopedics;  Laterality: Right;  right middle finger a-1 pulley release with tenosynovectomy   TRIGGER FINGER RELEASE     x 5   TROCHANTERIC BURSA EXCISION Right 02/04/2016   TUBAL LIGATION     TYMPANOPLASTY     Patient Active Problem List   Diagnosis Date Noted   Abdominal pain, chronic, epigastric 08/08/2022   Lower abdominal pain 08/08/2022   Loss of weight 05/05/2022   Dysphagia 05/05/2022   Gait abnormality 08/03/2021   Irritable bowel syndrome (IBS) 07/13/2021   Sciatica of right side 04/21/2020   Acquired short Achilles tendon, right 07/03/2019   Microvascular angina 01/01/2019   Unstable angina (HCC)    Common migraine with intractable migraine 06/16/2017   Tremor 11/16/2016   Trochanteric bursitis of left hip 02/04/2016   Trochanteric bursitis of both hips 10/31/2015   Chest pain at rest 06/17/2015   Inferior myocardial infarction [facility]) 06/17/2015   Sprain of interphalangeal joint of left little finger 02/05/2014   Pain in joint, hand 02/05/2014   Midsternal chest pain 06/04/2013   CAD (coronary artery disease)    Fibromyalgia    Gastroesophageal reflux disease    Migraine headache    Acute non-STEMI < 8 weeks prior, subsequent admission after initial 01/16/2013   Radial nerve laceration 03/27/2012   Lack of  coordination 03/27/2012   Muscle weakness (generalized) 03/27/2012    ONSET DATE: 03/29/2023  REFERRING DIAG: G25.0 (ICD-10-CM) - Essential tremor  THERAPY DIAG:  Tremors of nervous system  Other lack of coordination  Other disturbances of skin sensation  Muscle weakness (generalized)  Rationale for Evaluation and Treatment: Rehabilitation  SUBJECTIVE:   SUBJECTIVE STATEMENT:   Pt reports being tired, she has been cleaning up the barn etc.  Pt has figured out how to position her hands to help sleep with them open at night and even has a small ice pack she has slept with because the cold makes them feel better.  She has been taiking Advil but has been  having ongoing trouble with her hip s/p a misstep a couple of week ago.   Pt accompanied by: self  PERTINENT HISTORY:   Patient reports 22 surgeries in her lifetime, B tennis/golf surgery repairs, Trigger finger surgeries - Both thumbs, both middle fingers & R thumb surgery where she cut the tendons, Fibromyalgia x20 years, Raynaud's syndrome and Sjgren's syndrome (extensive dryness) x 30 years  PRECAUTIONS: None  WEIGHT BEARING RESTRICTIONS: No  PAIN:  Are you having pain? Yes: NPRS scale: 9 (same in feet) /10 Pain location: back and R hip (last lumbar disc in need of surgical repair) Pain description: sharp pains down her leg Aggravating factors: standing and moving (has done PT in the past) Relieving factors: Advil, ice pack, move and then sit  Has made her own ice pack 1/4 cup alcohol to 1 cup water for slushy.  Hands are achy and swelling at night  FALLS: Has patient fallen in last 6 months? 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  Tremors come and go - they are worse at night when she is tired. She has broken 3 glasses and a bowl ie) knocking them over or dropping item, in the past 2 weeks. No strength. Typing is getting worse.  OBJECTIVE:   HAND DOMINANCE: Right  ADLs: Overall ADLs: Independent Transfers/ambulation related to ADLs: I Eating: Harder to cut stuff recently Grooming: I UB Dressing: I but starting to have trouble with little buttons LB Dressing: I Toileting: I  Bathing: I  Tub Shower transfers: I Equipment: Shower seat with back  IADLs: Shopping: Still driving (vehicle, tractor, Health and safety inspector) Light housekeeping: Mopping hurts Meal Prep: MI - trouble cutting raw meats, she has found 2 knives that work well for her Community mobility: I/MI with walking stick at times Medication management: I with pill bottles Financial management: Husband does finances Handwriting: 75% legible  MOBILITY STATUS: Independent  POSTURE COMMENTS:  forward head Sitting balance: WFL  ACTIVITY TOLERANCE: Activity tolerance: Good  FUNCTIONAL OUTCOME MEASURES: TBD  UPPER EXTREMITY ROM:    Active ROM Right eval Left eval  Shoulder flexion WFL  Shoulder abduction   Shoulder adduction   Shoulder extension   Shoulder internal rotation   Shoulder external rotation   Elbow flexion   Elbow extension   Wrist flexion   Wrist extension   Wrist ulnar deviation   Wrist radial deviation   Wrist pronation   Wrist supination   (Blank rows = not tested)  UPPER EXTREMITY MMT:     MMT Right eval Left eval  Shoulder flexion    Shoulder abduction    Shoulder adduction    Shoulder extension    Shoulder internal rotation    Shoulder external rotation    Middle trapezius    Lower trapezius    Elbow flexion    Elbow extension    Wrist flexion 3+/5 3+/5  Wrist extension 3/5 3/5  Wrist ulnar deviation    Wrist radial  deviation    Wrist pronation    Wrist supination    (Blank rows = not tested)  HAND FUNCTION: Grip strength: Right: 46.0,  61.5, 67.2  lbs; Left: 55.9, 60.4, 62.1  lbs Average: Right: 58.2 lb Left: 59.5 lbs  COORDINATION: 9 Hole Peg test: Right: 22.67 sec; Left: 26.74 sec Box and Blocks:  Right 41 blocks, Left 43 blocks  SENSATION: Light touch: Impaired  - can notice distal phalange numbness and tingling  EDEMA: at night - especially after using tools  MUSCLE TONE: WFL  COGNITION: Overall cognitive status: Within functional limits for tasks assessed  VISION: Subjective report: Doesn't drive at night - see shadows of signs at night Baseline vision: Wears glasses all the time reading is harder but can watch TV without glasses Visual history: cataracts and floaters not r/t cataracts but not enough for surgery  VISION ASSESSMENT: Not tested  Patient has difficulty with following activities due to following visual impairments: crocheting and reading without glasses.  PERCEPTION: Not tested  PRAXIS: Not tested  OBSERVATIONS at Evaluation: Pt ambulates with MI and no use of AE with no loss of balance. The pt appears well kept and has an active lifestyle.  She has fairly good grip strength and coordination but with some tremors which tend to be worse in the evening and difficulty with sustained activity tolerance and safety ie) has broken 3 glass items lately.    TODAY'S TREATMENT:                                                                                                                               Therapeutic Activities:  Grooved pegboard Needed extra time assistance with Left hand - picked up 10 pegs with R hand and only 3-5 with L hand when removing pegs.  Pick up small pegs by tiny tip,   Reviewed energy conservation, stres reducation     Self care: Reviewed joint protection ideas as patient currently sleeps in fetal position.  Handout needs to be located but  education provided on sleep with arms stretched over a pillow, possible use of wrist splint and working to minimize tight fist/hand position.  Reviewed safety ideas with patient confirming use of strategies such as looking at what she is picking up, using 2 hands as needed, and continuing to work on tasks that work her coordination and strength ie) cleaning tiles form her crafting activities etc.  PATIENT EDUCATION: Education details: Introduced sleep position recommendations. Person educated: Patient Education method: Explanation and Verbal cues - NEEDS Handouts Education comprehension: verbalized understanding, verbal cues required, and needs further education  HOME EXERCISE PROGRAM: 04/18/23 - Putty Exercises  Access Code: [email or web address] & Coordination Activities with Images handout provided 05/02/23 - Sensory Safety, Desensitization Program, Joint Protection   GOALS: Goals reviewed with patient? Yes   SHORT TERM GOALS: Target date: 05/12/23  Patient will demonstrate UE coordination HEP with 25% verbal cues or less for proper execution.  Baseline: New to OT Goal status: MET  2.  Patient will complete typing assessment to assess difficulties and assist with modifications for improved ease with typing.  Baseline: Self reports typing is getting worse. Goal status: Goal discontinued - minimal typing needed now. Patient verbalizes text messages  3.  Patient will be aware of 3+ compensatory strategies for safety in the kitchen to prevent injury. Baseline: She has broken 3 glasses and a bowl ie) knocking them over or dropping item, in the past 2 weeks. Goal status: MET 05/24/23 - Patient able to grasp items with whole hand to minimize slipping out of hand, looks at objects and uses 2 hands  4.  Patient will complete nine-hole peg with use of BUE in 20-25 seconds or less with increased ease.  Baseline:  Right: 22.67 sec; Left: 26.74 sec with self reported awkwardness Goal status: MET 05/15/23  Right 19.47 sec  Left 21.86 Strategic with grasp ie) lower on finger rather than tipss   LONG TERM GOALS: Target date: 05/29/23  Patient will demonstrate UE ROM/coordination and sensory HEP with MI with proper execution.  Baseline: New to OT Goal status: MET  2. Patient will report no dropping of objects from B UE x 1 week. Baseline: She has broken 3 glasses and a bowl ie) knocking them over or dropping item, in the past 2 weeks. Goal status: MET   3.  Patient will improve pinch strength/coordination & stabilization of B hand to increase ease & independence in tying shoelaces, and managing buttons.  Baseline: Self reported difficulty. Goal status: MET 05/15/23 - Button assist education reviewed, pt uses slip on shoes now  4.  Improve touch sensation & calibration of grasp so patient can effectively grasp a paper/styrofoam cup w/o spilling and/or maintain hold of objects throughout home w/o slipping out of her hand. Baseline: Performing tasks bilaterally at eval for safety. Goal status: IN progress   ASSESSMENT:  CLINICAL IMPRESSION: Patient is a 72 y.o. female who returns for occupational therapy treatment today with overall improvements in B UE coordination with strategic grasp ie) below fingertips which are numb and with strategic sequence of tasks to avoid bumping pegs etc, she had 3-5 second improvement in task without significant tremors today. Patient education focussed on again today ie) joint protection, sleep positions, possible splint options and, further desensitization ideas. Pt will continue to benefit from skilled OT services in the outpatient setting to work on impairments to help pt return to max potential for UE awareness and motor skills.    PERFORMANCE DEFICITS: in functional skills including ADLs, IADLs, coordination, dexterity, sensation, Fine motor control, decreased knowledge of use of DME, and UE functional use,   IMPAIRMENTS: are limiting patient from ADLs,  IADLs, rest and sleep, and leisure.   CO-MORBIDITIES: has co-morbidities such as Raynaud's and h/o trigger finger surgeries and B elbow tennis/golfer's elbow  that affects occupational performance. Patient will benefit from skilled OT to address above impairments and improve overall function.  REHAB POTENTIAL: Good  PLAN:  OT FREQUENCY: 1x/week  OT DURATION: 6 weeks  PLANNED INTERVENTIONS: self care/ADL training, therapeutic exercise, therapeutic activity, neuromuscular re-education, electrical stimulation, ultrasound, fluidotherapy, patient/family education, visual/perceptual remediation/compensation, energy conservation, coping strategies training, and DME and/or AE instructions  RECOMMENDED OTHER SERVICES: NA  CONSULTED AND AGREED WITH PLAN OF CARE: Patient  PLAN FOR NEXT SESSION:   Sleep handout.  Picking up and moving objects to minimize dropping ie) stack small blocks, grooved pegboard  Victorino Sparrow, OT 05/24/2023, 5:22 PM

## 2023-05-29 ENCOUNTER — Ambulatory Visit: Payer: Medicare HMO | Admitting: Occupational Therapy

## 2023-05-29 DIAGNOSIS — R278 Other lack of coordination: Secondary | ICD-10-CM | POA: Diagnosis not present

## 2023-05-29 DIAGNOSIS — R208 Other disturbances of skin sensation: Secondary | ICD-10-CM | POA: Diagnosis not present

## 2023-05-29 DIAGNOSIS — R251 Tremor, unspecified: Secondary | ICD-10-CM

## 2023-05-29 DIAGNOSIS — R29818 Other symptoms and signs involving the nervous system: Secondary | ICD-10-CM

## 2023-05-29 DIAGNOSIS — M6281 Muscle weakness (generalized): Secondary | ICD-10-CM | POA: Diagnosis not present

## 2023-05-29 NOTE — Therapy (Unsigned)
OUTPATIENT OCCUPATIONAL THERAPY NEURO TREATMENT & DISCHARGE  Patient Name: Stacey Lawrence MRN: [id number] DOB:Mar 22, 1951, 72 y.o., female Today's Date: 05/29/2023  PCP: Benita Stabile, MD REFERRING PROVIDER: Shawnie Dapper, NP  END OF SESSION:  OT End of Session - 05/29/23 1532     Visit Number 7    Number of Visits 7   including evaluation   Date for OT Re-Evaluation 06/02/23    Authorization Type [employer]    OT Start Time 1532    OT Stop Time 1620    OT Time Calculation (min) 48 min    Activity Tolerance Patient tolerated treatment well    Behavior During Therapy WFL for tasks assessed/performed              Past Medical History:  Diagnosis Date   Anemia    hx   Anxiety    CAD (coronary artery disease)    a. 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 05/2013 Lexi CL: EF 77%, old small inferolat scar, no ischemia.   CHF (congestive heart failure) (HCC)    Common migraine with intractable migraine 06/16/2017   Complication of anesthesia    dificulty waking up   Depression    Fibromyalgia    GERD (gastroesophageal reflux disease)    hasn't started taking any meds   History of blood transfusion    70yrs ago   History of kidney stones    Hx of colonic polyps    IBS (irritable bowel syndrome)    constipation   Joint pain    Joint swelling    Migraine    "q other week" (02/04/2016)   Myocardial infarction (HCC) 2014   Numbness    right leg   Polycythemia    PONV (postoperative nausea and vomiting)    Raynaud's disease    Sciatica of right side 04/21/2020   Scoliosis    Shortness of breath dyspnea    with exertion   SI (sacroiliac) joint dysfunction    right   Wears glasses    Past Surgical History:  Procedure  Laterality Date   ABDOMINAL HYSTERECTOMY     partial   APPENDECTOMY     with explor. lap.   BIOPSY  05/31/2022   Procedure: BIOPSY;  Surgeon: Dolores Frame, MD;  Location: [facility];  Service: Gastroenterology;;   CARDIAC CATHETERIZATION     CHOLECYSTECTOMY  10/28/2002   lap.   COLONOSCOPY     COLONOSCOPY WITH PROPOFOL N/A 08/11/2021   Procedure: COLONOSCOPY WITH PROPOFOL;  Surgeon: Malissa Hippo, MD;  Location: [facility];  Service: Endoscopy;  Laterality: N/A;  8:30   DEBRIDEMENT TENNIS ELBOW     DIAGNOSTIC LAPAROSCOPY     DILATION AND CURETTAGE OF UTERUS     ELBOW SURGERY     golfer's elbow-bilateral   ESOPHAGEAL DILATION N/A 05/31/2022   Procedure: ESOPHAGEAL DILATION;  Surgeon: Dolores Frame, MD;  Location: [facility];  Service: Gastroenterology;  Laterality: N/A;   ESOPHAGOGASTRODUODENOSCOPY  09/10/2012   Procedure: ESOPHAGOGASTRODUODENOSCOPY (EGD);  Surgeon: Malissa Hippo, MD;  Location: [facility];  Service: Endoscopy;  Laterality: N/A;  730   ESOPHAGOGASTRODUODENOSCOPY (EGD) WITH PROPOFOL N/A 05/31/2022  Procedure: ESOPHAGOGASTRODUODENOSCOPY (EGD) WITH PROPOFOL;  Surgeon: Dolores Frame, MD;  Location: [facility];  Service: Gastroenterology;  Laterality: N/A;  205 ASA 3   EXCISION/RELEASE BURSA HIP Left 02/04/2016   Procedure: LEFT HIP TROCHANTERIC BURSECTOMY;  Surgeon: Kathryne Hitch, MD;  Location: [facility];  Service: Orthopedics;  Laterality: Left;   I & D EXTREMITY  02/25/2012   Procedure: IRRIGATION AND DEBRIDEMENT EXTREMITY;  Surgeon: Dominica Severin, MD;  Location: [facility] OR;  Service: Orthopedics;  Laterality: Right;  Irrigation and Debridement and Repair Right Thumb Nerve Laceration and Assoiated Structures    KNEE DEBRIDEMENT     right   LAPAROTOMY     LEFT HEART CATHETERIZATION WITH CORONARY ANGIOGRAM N/A 01/16/2013   Procedure: LEFT HEART CATHETERIZATION WITH CORONARY ANGIOGRAM;  Surgeon: Dolores Patty,  MD;  Location: [facility] CATH [facility];  Service: Cardiovascular;  Laterality: N/A;   POLYPECTOMY  08/11/2021   Procedure: POLYPECTOMY;  Surgeon: Malissa Hippo, MD;  Location: [facility];  Service: Endoscopy;;   SACROILIAC JOINT FUSION Right 05/02/2019   Procedure: SACROILIAC JOINT FUSION;  Surgeon: Venita Lick, MD;  Location: [facility] OR;  Service: Orthopedics;  Laterality: Right;  SACROILIAC JOINT FUSION   STAPEDECTOMY     right   TONSILLECTOMY AND ADENOIDECTOMY     TRIGGER FINGER RELEASE  08/19/2011   Procedure: RELEASE TRIGGER FINGER/A-1 PULLEY;  Surgeon: Oletta Cohn III;  Location: [facility];  Service: Orthopedics;  Laterality: Right;  right middle finger a-1 pulley release with tenosynovectomy   TRIGGER FINGER RELEASE     x 5   TROCHANTERIC BURSA EXCISION Right 02/04/2016   TUBAL LIGATION     TYMPANOPLASTY     Patient Active Problem List   Diagnosis Date Noted   Abdominal pain, chronic, epigastric 08/08/2022   Lower abdominal pain 08/08/2022   Loss of weight 05/05/2022   Dysphagia 05/05/2022   Gait abnormality 08/03/2021   Irritable bowel syndrome (IBS) 07/13/2021   Sciatica of right side 04/21/2020   Acquired short Achilles tendon, right 07/03/2019   Microvascular angina 01/01/2019   Unstable angina (HCC)    Common migraine with intractable migraine 06/16/2017   Tremor 11/16/2016   Trochanteric bursitis of left hip 02/04/2016   Trochanteric bursitis of both hips 10/31/2015   Chest pain at rest 06/17/2015   Inferior myocardial infarction [facility]) 06/17/2015   Sprain of interphalangeal joint of left little finger 02/05/2014   Pain in joint, hand 02/05/2014   Midsternal chest pain 06/04/2013   CAD (coronary artery disease)    Fibromyalgia    Gastroesophageal reflux disease    Migraine headache    Acute non-STEMI < 8 weeks prior, subsequent admission after initial 01/16/2013   Radial nerve laceration 03/27/2012   Lack of coordination 03/27/2012   Muscle  weakness (generalized) 03/27/2012    ONSET DATE: 03/29/2023  REFERRING DIAG: G25.0 (ICD-10-CM) - Essential tremor  THERAPY DIAG:  Tremors of nervous system  Other lack of coordination  Other symptoms and signs involving the nervous system  Rationale for Evaluation and Treatment: Rehabilitation  SUBJECTIVE:   SUBJECTIVE STATEMENT:   Pt reports coming off gabapentin due to being foggy brained and decreasing frequency over this week until discontinuing it.  She has been replacing it with Melatonin to help with sleeping.  She report continued success with positioning her hands for improved sleep.  The gabapentin was for the feeling in her feet but she states when comparing the foggy headedness to the pain in her  feet, she would rather the pain in her feet.   Pt reports doing over 3+ hours of pressure washing this weekend and although she wore her thumb splints while doing the task, she still needed to wear them today because of how her hands were feeling.   Pt accompanied by: self  PERTINENT HISTORY:   Patient reports 22 surgeries in her lifetime, B tennis/golf surgery repairs, Trigger finger surgeries - Both thumbs, both middle fingers & R thumb surgery where she cut the tendons, Fibromyalgia x20 years, Raynaud's syndrome and Sjgren's syndrome (extensive dryness) x 30 years  PRECAUTIONS: None  WEIGHT BEARING RESTRICTIONS: No  PAIN:  Are you having pain? Yes: NPRS scale: 9/10 Pain location: back and R hip (last lumbar disc in need of surgical repair) Pain description: sharp pains down her leg Aggravating factors: standing and moving (has done PT in the past) Relieving factors: Advil, ice pack, move and then sit    FALLS: Has patient fallen in last 6 months? 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  Tremors come and go - they are worse at night when she is tired. She has broken 3 glasses and a bowl ie) knocking them over or dropping item, in the past 2 weeks. No strength. Typing is getting worse.  OBJECTIVE:   HAND DOMINANCE: Right  ADLs: Overall ADLs: Independent Transfers/ambulation related to ADLs: I Eating: Harder to cut stuff recently Grooming: I UB Dressing: I but starting to have trouble with little buttons LB Dressing: I Toileting: I  Bathing: I  Tub Shower transfers: I Equipment: Shower seat with back  IADLs: Shopping: Still driving (vehicle, tractor, Health and safety inspector) Light housekeeping: Mopping hurts Meal Prep: MI - trouble cutting raw meats, she has found 2 knives that work well for her Community mobility: I/MI with walking stick at times Medication management: I with pill bottles Financial management: Husband does finances Handwriting: 75% legible  MOBILITY STATUS: Independent  POSTURE COMMENTS:  forward head Sitting balance: WFL  ACTIVITY TOLERANCE: Activity tolerance: Good  FUNCTIONAL OUTCOME MEASURES: TBD  UPPER EXTREMITY ROM:    Active ROM Right eval Left eval  Shoulder flexion WFL  Shoulder abduction   Shoulder adduction   Shoulder extension   Shoulder internal rotation   Shoulder external rotation   Elbow flexion   Elbow extension   Wrist flexion   Wrist extension   Wrist ulnar deviation   Wrist radial deviation   Wrist pronation   Wrist supination   (Blank rows = not tested)  UPPER EXTREMITY MMT:     MMT Right eval Left eval  Shoulder flexion    Shoulder abduction    Shoulder adduction    Shoulder extension    Shoulder internal rotation    Shoulder external rotation    Middle trapezius    Lower trapezius    Elbow flexion    Elbow  extension    Wrist flexion 3+/5 3+/5  Wrist extension 3/5 3/5  Wrist ulnar deviation    Wrist radial deviation    Wrist pronation    Wrist supination    (Blank rows = not tested)  HAND FUNCTION: Grip strength: Right: 46.0, 61.5, 67.2  lbs; Left: 55.9, 60.4, 62.1  lbs Average: Right: 58.2 lb Left: 59.5 lbs  05/29/23 Right: 62.6, 66.1 Average 64.4 lbs Left: 59.0, 57.5 Average 58.3 lbs  COORDINATION: 9 Hole Peg test: Right: 22.67 sec; Left: 26.74 sec Box and Blocks:  Right 41 blocks, Left 43 blocks  SENSATION: Light touch: Impaired  - can notice distal phalange numbness and tingling  EDEMA: at night - especially after using tools  MUSCLE TONE: WFL  COGNITION: Overall cognitive status: Within functional limits for tasks assessed  VISION: Subjective report: Doesn't drive at night - see shadows of signs at night Baseline vision: Wears glasses all the time reading is harder but can watch TV without glasses Visual history: cataracts and floaters not r/t cataracts but not enough for surgery  VISION ASSESSMENT: Not tested  Patient has difficulty with following activities due to following visual impairments: crocheting and reading without glasses.  PERCEPTION: Not tested  PRAXIS: Not tested  OBSERVATIONS at Evaluation: Pt ambulates with MI and no use of AE with no loss of balance. The pt appears well kept and has an active lifestyle.  She has fairly good grip strength and coordination but with some tremors which tend to be worse in the evening and difficulty with sustained activity tolerance and safety ie) has broken 3 glass items lately.    TODAY'S TREATMENT:                                                                                                                               Therapeutic Activities:  Reteted grip strength with improvement in R UE and LUE remaining similar to eval but with increased discomfort in hands during testing though s/p lots of heavy work this  weekend. Evaluation Average: Right: 58.2 lb Left: 59.5 lbs TODAY Right: 62.6, 66.1 Average 64.4 lbs Left: 59.0, 57.5 Average 58.3 lbs  Discharge Instructions reviewed with emphasis on Energy Conservation.  Patient continues to report prolonged participation in heavy work activities and energy conservation education is provided along with handouts.  Initial recommendations include the 4 pieces of energy conservation i.e. planning, pacing, prioritizing and positioning.  Examples are shared with patient regarding breaking up tasks such as the pressure washing activity she participated in for greater than 3 hours over the weekend.  Patient is able to confirm that she does have a hard time breaking up tasks i.e. wants to finish something when she started.  She continues to be encouraged to utilize other resources including her grandson who will be coming to visit. Additional energy conservation handouts were provided on housekeeping activities to help provide more specific information related to daily routine.  Patient remains very active with crafting as well.  She is encouraged to use her splints as needed, continue desensitization activities, and alternate activities when she has tremors.  Reviewed stress reduction ideas as she continues to agree that over-working and stress significantly impact  her comfort, tremors and sensory feedback.  Patient continues to be hard working and is encouraged to take breaks, change activities and seek assistance.    PATIENT EDUCATION: Education details: Reviewed stress reduction and energy conservation. Person educated: Patient Education method: Explanation, Verbal cues, and Handouts  Education comprehension: verbalized understanding  HOME EXERCISE PROGRAM: 04/18/23 - Putty Exercises  Access Code: [email or web address] & Coordination Activities with Images handout provided 05/02/23 - Sensory Safety, Desensitization Program, Joint Protection 05/29/23 - Energy  Conservation   GOALS: Goals reviewed with patient? Yes   SHORT TERM GOALS: Target date: 05/12/23  Patient will demonstrate UE coordination HEP with 25% verbal cues or less for proper execution.  Baseline: New to OT Goal status: MET  2.  Patient will complete typing assessment to assess difficulties and assist with modifications for improved ease with typing. Baseline: Self reports typing is getting worse. Goal status: Goal discontinued - minimal typing needed now. Patient verbalizes text messages  3.  Patient will be aware of 3+ compensatory strategies for safety in the kitchen to prevent injury. Baseline: She has broken 3 glasses and a bowl ie) knocking them over or dropping item, in the past 2 weeks. Goal status: MET 05/24/23 - Patient able to grasp items with whole hand to minimize slipping out of hand, looks at objects and uses 2 hands  4.  Patient will complete nine-hole peg with use of BUE in 20-25 seconds or less with increased ease.  Baseline:  Right: 22.67 sec; Left: 26.74 sec with self reported awkwardness Goal status: MET 05/15/23 Right 19.47 sec  Left 21.86 Strategic with grasp ie) lower on finger rather than tips   LONG TERM GOALS: Target date: 05/29/23  Patient will demonstrate UE ROM/coordination and sensory HEP with MI with proper execution.  Baseline: New to OT Goal status: MET  2. Patient will report no dropping of objects from B UE x 1 week. Baseline: She has broken 3 glasses and a bowl ie) knocking them over or dropping item, in the past 2 weeks. Goal status: MET   3.  Patient will improve pinch strength/coordination & stabilization of B hand to increase ease & independence in tying shoelaces, and managing buttons.  Baseline: Self reported difficulty. Goal status: MET 05/15/23 - Button assist education reviewed, pt uses slip on shoes now  4.  Improve touch sensation & calibration of grasp so patient can effectively grasp a paper/styrofoam cup w/o spilling  and/or maintain hold of objects throughout home w/o slipping out of her hand. Baseline: Performing tasks bilaterally at eval for safety. Goal status: MET   ASSESSMENT:  CLINICAL IMPRESSION: Patient is a 72 y.o. female who completed her occupational therapy treatment today with overall improvements in B UE coordination with remors today.  Patient education re: energy conservation, and ongoing relaxation activities.   PLAN:  OCCUPATIONAL THERAPY DISCHARGE SUMMARY  Visits from Start of Care: 7 including evaluation  Current functional level related to goals / functional outcomes: Pt has met all goals (3/3 short term and 4/4 long term) to satisfactory levels and is pleased with outcomes.   Remaining deficits: Pt has ongoing pain (chronic back and LE) but no more significant functional deficits in B Ues with good HEP for carryover.  Education / Equipment: Pt has all needed materials and education. Pt understands how to continue on with self-management. See tx notes for more details.   Patient agrees to discharge due to max benefits received from outpatient occupational therapy at this time.   Marylu Lund  Pricilla Holm, OT 05/29/2023, 5:11 PM

## 2023-06-12 ENCOUNTER — Other Ambulatory Visit (HOSPITAL_COMMUNITY): Payer: Self-pay

## 2023-06-23 ENCOUNTER — Ambulatory Visit
Admission: EM | Admit: 2023-06-23 | Discharge: 2023-06-23 | Disposition: A | Discharge status: Home / Self Care | Payer: Medicare HMO | Attending: Family Medicine | Admitting: Family Medicine

## 2023-06-23 ENCOUNTER — Encounter: Payer: Self-pay | Admitting: Emergency Medicine

## 2023-06-23 DIAGNOSIS — S0501XA Injury of conjunctiva and corneal abrasion without foreign body, right eye, initial encounter: Secondary | ICD-10-CM | POA: Diagnosis not present

## 2023-06-23 MED ORDER — ERYTHROMYCIN 5 MG/GM OP OINT: TOPICAL_OINTMENT | OPHTHALMIC | 0 refills | Status: DC

## 2023-06-23 NOTE — ED Provider Notes (Signed)
[facility]    CSN: [id number] Arrival date & time: 06/23/23  1909      History   Chief Complaint No chief complaint on file.   HPI Stacey Lawrence is a 72 y.o. female.   Patient presenting today with right eye pain after getting a blade of grass into the eye while mowing about an hour or 2 ago.  Has done multiple eye flushes at home but feels like it still moving around in the eye.  Denies vision change, decreased motion of the eye, headache, nausea, vomiting.    Past Medical History:  Diagnosis Date   Anemia    hx   Anxiety    CAD (coronary artery disease)    a. 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 05/2013 Lexi CL: EF 77%, old small inferolat scar, no ischemia.   CHF (congestive heart failure) (HCC)    Common migraine with intractable migraine 06/16/2017   Complication of anesthesia    dificulty waking up   Depression    Fibromyalgia    GERD (gastroesophageal reflux disease)    hasn't started taking any meds   History of blood transfusion    46yrs ago   History of kidney stones    Hx of colonic polyps    IBS (irritable bowel syndrome)    constipation   Joint pain    Joint swelling    Migraine    "q other week" (02/04/2016)   Myocardial infarction (HCC) 2014   Numbness    right leg   Polycythemia    PONV (postoperative nausea and vomiting)    Raynaud's disease    Sciatica of right side 04/21/2020   Scoliosis    Shortness of breath dyspnea    with exertion   SI (sacroiliac) joint dysfunction    right   Wears glasses     Patient Active Problem List   Diagnosis Date Noted   Abdominal pain, chronic, epigastric 08/08/2022   Lower abdominal pain 08/08/2022   Loss of weight 05/05/2022   Dysphagia  05/05/2022   Gait abnormality 08/03/2021   Irritable bowel syndrome (IBS) 07/13/2021   Sciatica of right side 04/21/2020   Acquired short Achilles tendon, right 07/03/2019   Microvascular angina (HCC) 01/01/2019   Unstable angina (HCC)    Common migraine with intractable migraine 06/16/2017   Tremor 11/16/2016   Trochanteric bursitis of left hip 02/04/2016   Trochanteric bursitis of both hips 10/31/2015   Chest pain at rest 06/17/2015   Inferior myocardial infarction [facility]) 06/17/2015   Sprain of interphalangeal joint of left little finger 02/05/2014   Pain in joint, hand 02/05/2014   Midsternal chest pain 06/04/2013   CAD (coronary artery disease)    Fibromyalgia    Gastroesophageal reflux disease    Migraine headache    Acute non-STEMI < 8 weeks prior, subsequent admission after initial 01/16/2013   Radial nerve laceration 03/27/2012   Lack of coordination 03/27/2012   Muscle weakness (generalized) 03/27/2012    Past Surgical History:  Procedure Laterality Date   ABDOMINAL HYSTERECTOMY     partial   APPENDECTOMY     with explor. lap.   BIOPSY  05/31/2022   Procedure: BIOPSY;  Surgeon:  Dolores Frame, MD;  Location: [facility];  Service: Gastroenterology;;   CARDIAC CATHETERIZATION     CHOLECYSTECTOMY  10/28/2002   lap.   COLONOSCOPY     COLONOSCOPY WITH PROPOFOL N/A 08/11/2021   Procedure: COLONOSCOPY WITH PROPOFOL;  Surgeon: Malissa Hippo, MD;  Location: [facility];  Service: Endoscopy;  Laterality: N/A;  8:30   DEBRIDEMENT TENNIS ELBOW     DIAGNOSTIC LAPAROSCOPY     DILATION AND CURETTAGE OF UTERUS     ELBOW SURGERY     golfer's elbow-bilateral   ESOPHAGEAL DILATION N/A 05/31/2022   Procedure: ESOPHAGEAL DILATION;  Surgeon: Dolores Frame, MD;  Location: [facility];  Service: Gastroenterology;  Laterality: N/A;   ESOPHAGOGASTRODUODENOSCOPY  09/10/2012   Procedure: ESOPHAGOGASTRODUODENOSCOPY (EGD);  Surgeon: Malissa Hippo, MD;   Location: [facility];  Service: Endoscopy;  Laterality: N/A;  730   ESOPHAGOGASTRODUODENOSCOPY (EGD) WITH PROPOFOL N/A 05/31/2022   Procedure: ESOPHAGOGASTRODUODENOSCOPY (EGD) WITH PROPOFOL;  Surgeon: Dolores Frame, MD;  Location: [facility];  Service: Gastroenterology;  Laterality: N/A;  205 ASA 3   EXCISION/RELEASE BURSA HIP Left 02/04/2016   Procedure: LEFT HIP TROCHANTERIC BURSECTOMY;  Surgeon: Kathryne Hitch, MD;  Location[facility] OR;  Service: Orthopedics;  Laterality: Left;   I & D EXTREMITY  02/25/2012   Procedure: IRRIGATION AND DEBRIDEMENT EXTREMITY;  Surgeon: Dominica Severin, MD;  Location: [facility] OR;  Service: Orthopedics;  Laterality: Right;  Irrigation and Debridement and Repair Right Thumb Nerve Laceration and Assoiated Structures    KNEE DEBRIDEMENT     right   LAPAROTOMY     LEFT HEART CATHETERIZATION WITH CORONARY ANGIOGRAM N/A 01/16/2013   Procedure: LEFT HEART CATHETERIZATION WITH CORONARY ANGIOGRAM;  Surgeon: Dolores Patty, MD;  Location: [facility] CATH LAB;  Service: Cardiovascular;  Laterality: N/A;   POLYPECTOMY  08/11/2021   Procedure: POLYPECTOMY;  Surgeon: Malissa Hippo, MD;  Location: [facility];  Service: Endoscopy;;   SACROILIAC JOINT FUSION Right 05/02/2019   Procedure: SACROILIAC JOINT FUSION;  Surgeon: Venita Lick, MD;  Location: [facility] OR;  Service: Orthopedics;  Laterality: Right;  SACROILIAC JOINT FUSION   STAPEDECTOMY     right   TONSILLECTOMY AND ADENOIDECTOMY     TRIGGER FINGER RELEASE  08/19/2011   Procedure: RELEASE TRIGGER FINGER/A-1 PULLEY;  Surgeon: Oletta Cohn III;  Location: [facility];  Service: Orthopedics;  Laterality: Right;  right middle finger a-1 pulley release with tenosynovectomy   TRIGGER FINGER RELEASE     x 5   TROCHANTERIC BURSA EXCISION Right 02/04/2016   TUBAL LIGATION     TYMPANOPLASTY      OB History   No obstetric history on file.      Home Medications    Prior to Admission  medications   Medication Sig Start Date End Date Taking? Authorizing Provider  erythromycin ophthalmic ointment Place a 1/2 inch ribbon of ointment into the right lower eyelid BID. 06/23/23  Yes Particia Nearing, PA-C  BIOTIN PO Take 1 tablet by mouth daily.    [provider]  Calcium Citrate-Vitamin D (CALCITRATE/VITAMIN D PO) Take 1 tablet by mouth daily.    [provider]  Cholecalciferol (VITAMIN D3) 50 MCG (2000 UT) TABS Take 2,000 Units by mouth daily.    [provider]  clotrimazole-betamethasone (LOTRISONE) cream Apply topically once daily as directed. 04/25/23   Louann Sjogren, DPM  COLLAGEN PO Take 1 tablet by mouth daily.    [provider]  famotidine (PEPCID) 20 MG tablet Take 1 tablet (20 mg total) by mouth at bedtime. 08/08/22   Carlan, Chelsea L, NP  gabapentin (NEURONTIN) 300 MG capsule Take 1 capsule (300 mg total) by mouth at bedtime. 04/24/23   Louann Sjogren, DPM  ketoconazole (NIZORAL) 2 % cream Apply 1 Application topically daily. 11/28/22   Louann Sjogren, DPM  mirabegron ER (MYRBETRIQ) 50 MG TB24 tablet Take 1 tablet (50 mg total) by mouth daily. 05/02/23     mirtazapine (REMERON SOL-TAB) 15 MG disintegrating tablet Take 1 tablet (15 mg total) by mouth at bedtime. 12/14/22   Dolores Frame, MD  omeprazole (PRILOSEC) 40 MG capsule Take 1 capsule (40 mg total) by mouth daily. 08/08/22   Carlan, Jeral Pinch, NP  Turmeric (QC TUMERIC COMPLEX PO) Take 1,500 mg by mouth daily.    [provider]    Family History Family History  Problem Relation Age of Onset   Cancer Mother        Deceased with lung CA   Heart failure Mother    Heart disease Mother    COPD Mother    Varicose Veins Mother    Depression Mother    Cancer Father        Deceased with lung CA   Heart disease Father    Varicose Veins Father    Depression Father    Depression Brother    Lung cancer Brother    ADD / ADHD Son    Parkinson's disease  Sister    Other Sister        enlarged heart    Tremor Maternal Grandmother    Parkinson's disease Paternal Grandfather    Lung cancer Other        3 aunts, 2 uncles on both sides of family     Social History Social History   Tobacco Use   Smoking status: Never    Passive exposure: Never   Smokeless tobacco: Never  Vaping Use   Vaping status: Never Used  Substance Use Topics   Alcohol use: No    Alcohol/week: 0.0 standard drinks of alcohol   Drug use: No     Allergies   Dilaudid [hydromorphone hcl], Codeine, and Morphine and codeine   Review of Systems Review of Systems Per HPI  Physical Exam Triage Vital Signs ED Triage Vitals [06/23/23 1913]  Encounter Vitals Group     BP 131/64     Systolic BP Percentile      Diastolic BP Percentile      Pulse Rate 61     Resp 18     Temp 98.4 F (36.9 C)     Temp Source Oral     SpO2 95 %     Weight      Height      Head Circumference      Peak Flow      Pain Score 6     Pain Loc      Pain Education      Exclude from Growth Chart    No data found.  Updated Vital Signs BP 131/64 (BP Location: Right Arm)   Pulse 61   Temp 98.4 F (36.9 C) (Oral)   Resp 18   SpO2 95%   Visual Acuity Right Eye Distance:   Left Eye Distance:   Bilateral Distance:    Right Eye Near:   Left Eye Near:    Bilateral Near:     Physical Exam Vitals and nursing  note reviewed.  Constitutional:      Appearance: Normal appearance. She is not ill-appearing.  HENT:     Head: Atraumatic.  Eyes:     Extraocular Movements: Extraocular movements intact.     Pupils: Pupils are equal, round, and reactive to light.     Comments: No foreign body to right eye appreciable on thorough exam including under fluorescein stain.  Multiple areas of of increased uptake to the right cornea on fluorescein stain  Cardiovascular:     Rate and Rhythm: Normal rate and regular rhythm.     Heart sounds: Normal heart sounds.  Pulmonary:     Effort:  Pulmonary effort is normal.     Breath sounds: Normal breath sounds.  Musculoskeletal:        General: Normal range of motion.     Cervical back: Normal range of motion and neck supple.  Skin:    General: Skin is warm and dry.  Neurological:     Mental Status: She is alert and oriented to person, place, and time.  Psychiatric:        Mood and Affect: Mood normal.        Thought Content: Thought content normal.        Judgment: Judgment normal.      UC Treatments / Results  Labs (all labs ordered are listed, but only abnormal results are displayed) Labs Reviewed - No data to display  EKG   Radiology No results found.  Procedures Procedures (including critical care time)  Medications Ordered in UC Medications - No data to display  Initial Impression / Assessment and Plan / UC Course  I have reviewed the triage vital signs and the nursing notes.  Pertinent labs & imaging results that were available during my care of the patient were reviewed by me and considered in my medical decision making (see chart for details).     No foreign body appreciable on exam, corneal abrasion causing foreign body sensation.  Treat with erythromycin ointment, cool compresses, ibuprofen and Tylenol as needed.  Visual acuity declined as vision intact.  Final Clinical Impressions(s) / UC Diagnoses   Final diagnoses:  Abrasion of right cornea, initial encounter   Discharge Instructions   None    ED Prescriptions     Medication Sig Dispense Auth. Provider   erythromycin ophthalmic ointment Place a 1/2 inch ribbon of ointment into the right lower eyelid BID. 3.5 g Particia Nearing, PA-C      PDMP not reviewed this encounter.   Particia Nearing, [place] 06/23/23 1931

## 2023-06-23 NOTE — ED Triage Notes (Signed)
While mowing today, a piece of grass got into her right eye

## 2023-07-20 ENCOUNTER — Other Ambulatory Visit (HOSPITAL_COMMUNITY): Payer: Self-pay

## 2023-07-25 ENCOUNTER — Ambulatory Visit: Payer: Medicare HMO | Admitting: Podiatry

## 2023-07-25 ENCOUNTER — Encounter: Payer: Self-pay | Admitting: Podiatry

## 2023-07-25 ENCOUNTER — Other Ambulatory Visit (HOSPITAL_COMMUNITY): Payer: Self-pay

## 2023-07-25 DIAGNOSIS — M792 Neuralgia and neuritis, unspecified: Secondary | ICD-10-CM

## 2023-07-25 DIAGNOSIS — B353 Tinea pedis: Secondary | ICD-10-CM

## 2023-07-25 DIAGNOSIS — M5416 Radiculopathy, lumbar region: Secondary | ICD-10-CM

## 2023-07-25 MED ORDER — PREGABALIN 75 MG PO CAPS
75.0000 mg | ORAL_CAPSULE | Freq: Two times a day (BID) | ORAL | 2 refills | Status: DC
  Filled 2023-07-25: qty 60, 30d supply, fill #0

## 2023-07-25 MED ORDER — CLOTRIMAZOLE-BETAMETHASONE 1-0.05 % EX CREA
1.0000 | TOPICAL_CREAM | Freq: Every day | CUTANEOUS | 0 refills | Status: AC
  Filled 2023-07-25 – 2024-07-01 (×2): qty 30, 30d supply, fill #0

## 2023-07-25 NOTE — Progress Notes (Signed)
  Subjective:  Patient ID: Stacey Lawrence, female    DOB: 1951-02-19,   MRN: [id number]  Chief Complaint  Patient presents with   Foot Problem    Pt presents for bil foot pain pt stated tat her left foot ha been peeling on the bottom of the foot.    72 y.o. female presents for follow-up of foot fungus. Relates the peeling has come back and has started using Lotrisone again but almost out. She is also scheduled for have her back evaluated and possible surgery for radiculopathy. Relates she was unable to tolerate the gabapentin as it put her in a fog.     She is a retired Scientist, forensic.  . Denies any other pedal complaints. Denies n/v/f/c.   Past Medical History:  Diagnosis Date   Anemia    hx   Anxiety    CAD (coronary artery disease)    a. 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 05/2013 Lexi CL: EF 77%, old small inferolat scar, no ischemia.   CHF (congestive heart failure) (HCC)    Common migraine with intractable migraine 06/16/2017   Complication of anesthesia    dificulty waking up   Depression    Fibromyalgia    GERD (gastroesophageal reflux disease)    hasn't started taking any meds   History of blood transfusion    34yrs ago   History of kidney stones    Hx of colonic polyps    IBS (irritable bowel syndrome)    constipation   Joint pain    Joint swelling    Migraine    "q other week" (02/04/2016)   Myocardial infarction (HCC) 2014   Numbness    right leg   Polycythemia    PONV (postoperative nausea and vomiting)    Raynaud's disease    Sciatica of right side 04/21/2020   Scoliosis    Shortness of breath dyspnea    with exertion   SI (sacroiliac) joint dysfunction    right   Wears glasses     Objective:  Physical  Exam: Vascular: DP/PT pulses 2/4 bilateral. CFT <3 seconds. Normal hair growth on digits. No edema.  Skin. No lacerations or abrasions bilateral feet. Scaling and erythema improved.  Musculoskeletal: MMT 5/5 bilateral lower extremities in DF, PF, Inversion and Eversion. Deceased ROM in DF of ankle joint. No pain to palpation about the foot and negative tinels sign Neurological: Sensation intact to light touch.   Assessment:   1. Lumbar radiculopathy   2. Neuritis   3. Tinea pedis of both feet          Plan:  Patient was evaluated and treated and all questions answered. Discussed neuropathy and etiology as well as treatment with patient. Feel this is no longer tinea as skin has cleared.  -Discussed and educated patient on foot care, especially with  regards to the vascular, neurological and musculoskeletal systems.  -Discussed supportive shoes at all times and checking feet regularly.  -Will try lyrica as she has failed gabapentin to see if this helps with her pains.  -Patient to return in 3 months for follow-up evaluation.    Louann Sjogren, DPM

## 2023-07-26 DIAGNOSIS — M5459 Other low back pain: Secondary | ICD-10-CM | POA: Diagnosis not present

## 2023-07-26 DIAGNOSIS — M5451 Vertebrogenic low back pain: Secondary | ICD-10-CM | POA: Diagnosis not present

## 2023-08-03 ENCOUNTER — Other Ambulatory Visit (HOSPITAL_COMMUNITY): Payer: Self-pay

## 2023-08-03 DIAGNOSIS — N3946 Mixed incontinence: Secondary | ICD-10-CM | POA: Diagnosis not present

## 2023-08-03 DIAGNOSIS — R7301 Impaired fasting glucose: Secondary | ICD-10-CM | POA: Diagnosis not present

## 2023-08-03 DIAGNOSIS — E782 Mixed hyperlipidemia: Secondary | ICD-10-CM | POA: Diagnosis not present

## 2023-08-03 MED ORDER — MIRABEGRON ER 50 MG PO TB24
50.0000 mg | ORAL_TABLET | Freq: Every day | ORAL | 11 refills | Status: DC
  Filled 2023-08-03 – 2023-09-04 (×3): qty 30, 30d supply, fill #0

## 2023-08-04 ENCOUNTER — Other Ambulatory Visit (HOSPITAL_COMMUNITY): Payer: Self-pay

## 2023-08-08 ENCOUNTER — Other Ambulatory Visit (HOSPITAL_COMMUNITY): Payer: Self-pay | Admitting: Obstetrics and Gynecology

## 2023-08-08 DIAGNOSIS — Z1231 Encounter for screening mammogram for malignant neoplasm of breast: Secondary | ICD-10-CM

## 2023-08-09 DIAGNOSIS — E782 Mixed hyperlipidemia: Secondary | ICD-10-CM | POA: Diagnosis not present

## 2023-08-09 DIAGNOSIS — G43909 Migraine, unspecified, not intractable, without status migrainosus: Secondary | ICD-10-CM | POA: Diagnosis not present

## 2023-08-09 DIAGNOSIS — R1310 Dysphagia, unspecified: Secondary | ICD-10-CM | POA: Diagnosis not present

## 2023-08-09 DIAGNOSIS — F332 Major depressive disorder, recurrent severe without psychotic features: Secondary | ICD-10-CM | POA: Diagnosis not present

## 2023-08-09 DIAGNOSIS — R251 Tremor, unspecified: Secondary | ICD-10-CM | POA: Diagnosis not present

## 2023-08-09 DIAGNOSIS — R634 Abnormal weight loss: Secondary | ICD-10-CM | POA: Diagnosis not present

## 2023-08-09 DIAGNOSIS — K219 Gastro-esophageal reflux disease without esophagitis: Secondary | ICD-10-CM | POA: Diagnosis not present

## 2023-08-09 DIAGNOSIS — Z23 Encounter for immunization: Secondary | ICD-10-CM | POA: Diagnosis not present

## 2023-08-09 DIAGNOSIS — R7301 Impaired fasting glucose: Secondary | ICD-10-CM | POA: Diagnosis not present

## 2023-08-14 ENCOUNTER — Other Ambulatory Visit (HOSPITAL_COMMUNITY): Payer: Self-pay

## 2023-08-14 DIAGNOSIS — M6281 Muscle weakness (generalized): Secondary | ICD-10-CM | POA: Diagnosis not present

## 2023-08-14 DIAGNOSIS — N3946 Mixed incontinence: Secondary | ICD-10-CM | POA: Diagnosis not present

## 2023-08-14 DIAGNOSIS — R278 Other lack of coordination: Secondary | ICD-10-CM | POA: Diagnosis not present

## 2023-08-15 DIAGNOSIS — M5416 Radiculopathy, lumbar region: Secondary | ICD-10-CM | POA: Diagnosis not present

## 2023-08-21 ENCOUNTER — Ambulatory Visit (HOSPITAL_COMMUNITY)
Admission: RE | Admit: 2023-08-21 | Discharge: 2023-08-21 | Disposition: A | Discharge status: Home / Self Care | Payer: Medicare HMO | Source: Ambulatory Visit | Attending: Obstetrics and Gynecology | Admitting: Obstetrics and Gynecology

## 2023-08-21 DIAGNOSIS — Z1231 Encounter for screening mammogram for malignant neoplasm of breast: Secondary | ICD-10-CM | POA: Insufficient documentation

## 2023-08-23 DIAGNOSIS — M5451 Vertebrogenic low back pain: Secondary | ICD-10-CM | POA: Diagnosis not present

## 2023-08-28 ENCOUNTER — Other Ambulatory Visit (HOSPITAL_COMMUNITY): Payer: Self-pay

## 2023-09-04 ENCOUNTER — Other Ambulatory Visit (HOSPITAL_COMMUNITY): Payer: Self-pay

## 2023-09-04 DIAGNOSIS — M1812 Unilateral primary osteoarthritis of first carpometacarpal joint, left hand: Secondary | ICD-10-CM | POA: Diagnosis not present

## 2023-09-04 DIAGNOSIS — M65311 Trigger thumb, right thumb: Secondary | ICD-10-CM | POA: Diagnosis not present

## 2023-09-06 DIAGNOSIS — E2839 Other primary ovarian failure: Secondary | ICD-10-CM | POA: Diagnosis not present

## 2023-09-06 DIAGNOSIS — M8588 Other specified disorders of bone density and structure, other site: Secondary | ICD-10-CM | POA: Diagnosis not present

## 2023-09-25 DIAGNOSIS — Z85828 Personal history of other malignant neoplasm of skin: Secondary | ICD-10-CM | POA: Diagnosis not present

## 2023-09-25 DIAGNOSIS — L814 Other melanin hyperpigmentation: Secondary | ICD-10-CM | POA: Diagnosis not present

## 2023-09-25 DIAGNOSIS — L821 Other seborrheic keratosis: Secondary | ICD-10-CM | POA: Diagnosis not present

## 2023-09-25 DIAGNOSIS — D225 Melanocytic nevi of trunk: Secondary | ICD-10-CM | POA: Diagnosis not present

## 2023-09-25 DIAGNOSIS — Z08 Encounter for follow-up examination after completed treatment for malignant neoplasm: Secondary | ICD-10-CM | POA: Diagnosis not present

## 2023-10-04 DIAGNOSIS — M5416 Radiculopathy, lumbar region: Secondary | ICD-10-CM | POA: Diagnosis not present

## 2023-10-05 DIAGNOSIS — M65311 Trigger thumb, right thumb: Secondary | ICD-10-CM | POA: Diagnosis not present

## 2023-10-19 DIAGNOSIS — M62838 Other muscle spasm: Secondary | ICD-10-CM | POA: Diagnosis not present

## 2023-10-19 DIAGNOSIS — K5900 Constipation, unspecified: Secondary | ICD-10-CM | POA: Diagnosis not present

## 2023-10-19 DIAGNOSIS — N3941 Urge incontinence: Secondary | ICD-10-CM | POA: Diagnosis not present

## 2023-10-19 DIAGNOSIS — M6289 Other specified disorders of muscle: Secondary | ICD-10-CM | POA: Diagnosis not present

## 2023-10-19 DIAGNOSIS — M6281 Muscle weakness (generalized): Secondary | ICD-10-CM | POA: Diagnosis not present

## 2023-10-26 DIAGNOSIS — R5383 Other fatigue: Secondary | ICD-10-CM | POA: Diagnosis not present

## 2023-10-30 ENCOUNTER — Encounter (INDEPENDENT_AMBULATORY_CARE_PROVIDER_SITE_OTHER): Payer: Self-pay | Admitting: Gastroenterology

## 2023-10-30 ENCOUNTER — Other Ambulatory Visit (HOSPITAL_COMMUNITY): Payer: Self-pay

## 2023-10-30 ENCOUNTER — Ambulatory Visit (INDEPENDENT_AMBULATORY_CARE_PROVIDER_SITE_OTHER): Payer: Medicare HMO | Admitting: Gastroenterology

## 2023-10-30 VITALS — BP 102/59 | HR 85 | Temp 98.0°F | Ht 66.0 in | Wt 132.2 lb

## 2023-10-30 DIAGNOSIS — K581 Irritable bowel syndrome with constipation: Secondary | ICD-10-CM | POA: Diagnosis not present

## 2023-10-30 DIAGNOSIS — G8929 Other chronic pain: Secondary | ICD-10-CM

## 2023-10-30 MED ORDER — LINACLOTIDE 290 MCG PO CAPS
290.0000 ug | ORAL_CAPSULE | Freq: Every day | ORAL | 3 refills | Status: DC
  Filled 2023-10-30: qty 90, 90d supply, fill #0

## 2023-10-30 NOTE — Progress Notes (Signed)
 Samantha Cress, M.D. Gastroenterology & Hepatology [facility] [address] [place], [place] [address]  Primary Care Physician: Omie Bickers, MD [address] [place] [address]  I will communicate my assessment and recommendations to the referring MD via EMR.  Problems: IBS GERD  History of Present Illness: Stacey Lawrence is a 73 y.o. female with past medical history of anemia, anxiety, CAD, fibromyalgia, GERD, IBS, previous MI, Raynaud's, scoliosis, who presents for evaluation of constipation and abdominal pain.  The patient was last seen on 10/31/2022. At that time, the patient was advised to increase intake of protein shakes twice a day and 1 at night due to weight loss issues.  Patient reports that she has felt persistently decreased appetite. She is only having two meals per day but has been taking protein shakes daily.  She is still having abdominal pain in her upper abdomen.  States that the pain is especially exacerbated after having any meal, but she Lawrence have the pain several hours after having food intake.  She reports that she is having a bowel movement rarely if she does not take anything. She takes Colace x3 which helps her move her bowels once a week.   She has only tried Linzess  in the past, does not know how much she was taking in the past.  The patient denies having any nausea, vomiting, fever, chills, hematochezia, melena, hematemesis, diarrhea, jaundice, pruritus or weight loss.  Has be  under significant stress as her husband was recently diagnosed with kidney cancer.  Also, at least 2 family members passed away in the last year.  Most recent labs from 08/03/2023 showed hemoglobin A1c 5.9, CBC with WBC 4.8, hemoglobin 15.6, platelets 196, CMP creatinine 0.81, BUN 21, normal electrolytes, AST 20, ALT 17, alkaline phosphatase 88 and total bilirubin 0.5, albumin was 4.0.  Previous GI workup: -Gastric emptying  study 06/22/2022  - increased gastric emptying, question hypermotility versus dumping syndrome -CT chest, abdomen and pelvis with IV contrast on 07/01/2022: Benign hepatic cysts, status postcholecystectomy and hysterectomy  Last Colonoscopy:08/11/21 - Diverticulosis in the sigmoid colon and in the transverse colon. - Two small polyps at the splenic flexure and in the transverse colon-2 tubular adenomas and one hyperplastic polyp.  - One 4 to 8 mm polyp at the splenic flexure, removed with a cold snare. Resected and retrieved. - External hemorrhoids.   Recommend to repeat colonoscopy in 5 years.   Last Endoscopy:05/31/22 No endoscopic esophageal abnormality to explain patient's dysphagia. Esophagus dilated. Dilated. - 2 cm hiatal hernia. - Gastroesophageal flap valve classified as Hill Grade IV (no fold, wide open lumen, hiatal hernia present). - Normal stomach. Biopsied. - Normal examined duodenum.   Pathology showed chronic gastritis without H. pylori or dysplasia.  Past Medical History: Past Medical History:  Diagnosis Date   Anemia    hx   Anxiety    CAD (coronary artery disease)    a. 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 05/2013 Lexi CL: EF 77%, old small inferolat scar, no ischemia.   CHF (congestive heart failure) (HCC)    Common migraine with intractable migraine 06/16/2017   Complication of anesthesia  dificulty waking up   Depression    Fibromyalgia    GERD (gastroesophageal reflux disease)    hasn't started taking any meds   History of blood transfusion    35yrs ago   History of kidney stones    Hx of colonic polyps    IBS (irritable bowel syndrome)    constipation   Joint pain    Joint swelling    Migraine    "q  other week" (02/04/2016)   Myocardial infarction (HCC) 2014   Numbness    right leg   Polycythemia    PONV (postoperative nausea and vomiting)    Raynaud's disease    Sciatica of right side 04/21/2020   Scoliosis    Shortness of breath dyspnea    with exertion   SI (sacroiliac) joint dysfunction    right   Wears glasses     Past Surgical History: Past Surgical History:  Procedure Laterality Date   ABDOMINAL HYSTERECTOMY     partial   APPENDECTOMY     with explor. lap.   BIOPSY  05/31/2022   Procedure: BIOPSY;  Surgeon: Urban Garden, MD;  [facility]: [facility];  Service: Gastroenterology;;   CARDIAC CATHETERIZATION     CHOLECYSTECTOMY  10/28/2002   lap.   COLONOSCOPY     COLONOSCOPY WITH PROPOFOL  N/A 08/11/2021   Procedure: COLONOSCOPY WITH PROPOFOL ;  Surgeon: Ruby Corporal, MD;  Location: [facility];  Service: Endoscopy;  Laterality: N/A;  8:30   DEBRIDEMENT TENNIS ELBOW     DIAGNOSTIC LAPAROSCOPY     DILATION AND CURETTAGE OF UTERUS     ELBOW SURGERY     golfer's elbow-bilateral   ESOPHAGEAL DILATION N/A 05/31/2022   Procedure: ESOPHAGEAL DILATION;  Surgeon: Urban Garden, MD;  [facility]: [facility];  Service: Gastroenterology;  Laterality: N/A;   ESOPHAGOGASTRODUODENOSCOPY  09/10/2012   Procedure: ESOPHAGOGASTRODUODENOSCOPY (EGD);  Surgeon: Ruby Corporal, MD;  [facility]: [facility];  Service: Endoscopy;  Laterality: N/A;  730   ESOPHAGOGASTRODUODENOSCOPY (EGD) WITH PROPOFOL  N/A 05/31/2022   Procedure: ESOPHAGOGASTRODUODENOSCOPY (EGD) WITH PROPOFOL ;  Surgeon: Urban Garden, MD;  [facility]: [facility];  Service: Gastroenterology;  Laterality: N/A;  205 ASA 3   EXCISION/RELEASE BURSA HIP Left 02/04/2016   Procedure: LEFT HIP TROCHANTERIC BURSECTOMY;  Surgeon: Arnie Lao, MD;  Location: [facility] OR;  Service: Orthopedics;  Laterality: Left;   I & D EXTREMITY  02/25/2012   Procedure: IRRIGATION AND DEBRIDEMENT EXTREMITY;   Surgeon: Ronn Cohn, MD;  Location: [facility] OR;  Service: Orthopedics;  Laterality: Right;  Irrigation and Debridement and Repair Right Thumb Nerve Laceration and Assoiated Structures    KNEE DEBRIDEMENT     right   LAPAROTOMY     LEFT HEART CATHETERIZATION WITH CORONARY ANGIOGRAM N/A 01/16/2013   Procedure: LEFT HEART CATHETERIZATION WITH CORONARY ANGIOGRAM;  Surgeon: Mardell Shade, MD;  Location: [facility] C[facility];  Service: Cardiovascular;  Laterality: N/A;   POLYPECTOMY  08/11/2021   Procedure: POLYPECTOMY;  Surgeon: Ruby Corporal, MD;  [facility]: [facility];  Service: Endoscopy;;   SACROILIAC JOINT FUSION Right 05/02/2019   Procedure: SACROILIAC JOINT FUSION;  Surgeon: Mort Ards, MD;  Location: [facility] OR;  Service: Orthopedics;  Laterality: Right;  SACROILIAC JOINT FUSION   STAPEDECTOMY     right   TONSILLECTOMY AND ADENOIDECTOMY     TRIGGER FINGER RELEASE  08/19/2011   Procedure: RELEASE TRIGGER FINGER/A-1 PULLEY;  Surgeon: Isom Marin III;  Location: [facility];  Service: Orthopedics;  Laterality: Right;  right middle finger a-1 pulley release with tenosynovectomy   TRIGGER FINGER RELEASE     x 5   TROCHANTERIC BURSA EXCISION Right 02/04/2016   TUBAL LIGATION     TYMPANOPLASTY      Family History: Family History  Problem Relation Age of Onset   Cancer Mother        Deceased with lung CA   Heart failure Mother    Heart disease Mother    COPD Mother    Varicose Veins Mother    Depression Mother    Cancer Father        Deceased with lung CA   Heart disease Father    Varicose Veins Father    Depression Father    Depression Brother    Lung cancer Brother    ADD / ADHD Son    Parkinson's disease Sister    Other Sister        enlarged heart    Tremor Maternal Grandmother    Parkinson's disease Paternal Grandfather    Lung cancer Other        3 aunts, 2 uncles on both sides of family     Social History: Social History   Tobacco Use  Smoking  Status Never   Passive exposure: Never  Smokeless Tobacco Never   Social History   Substance and Sexual Activity  Alcohol Use No   Alcohol/week: 0.0 standard drinks of alcohol   Social History   Substance and Sexual Activity  Drug Use No    Allergies: Allergies  Allergen Reactions   Dilaudid  [Hydromorphone  Hcl] Other (See Comments)    Resp. Arrest Body drop in temperature   Codeine Nausea And Vomiting and Rash   Morphine And Codeine Nausea And Vomiting    Medications: Current Outpatient Medications  Medication Sig Dispense Refill   BIOTIN PO Take 1 tablet by mouth daily.     Calcium  Citrate-Vitamin D (CALCITRATE/VITAMIN D PO) Take 1 tablet by mouth daily.     Cholecalciferol (VITAMIN D3) 50 MCG (2000 UT) TABS Take 2,000 Units by mouth daily.     clotrimazole -betamethasone  (LOTRISONE ) cream Apply topically once daily as directed. 30 g 0   mirabegron  ER (MYRBETRIQ ) 50 MG TB24 tablet Take 1 tablet (50 mg total) by mouth daily. 30 tablet 11   omeprazole  (PRILOSEC) 40 MG capsule Take 1 capsule (40 mg total) by mouth daily. 90 capsule 3   Turmeric (QC TUMERIC COMPLEX PO) Take 1,500 mg by mouth daily.     famotidine  (PEPCID ) 20 MG tablet Take 1 tablet (20 mg total) by mouth at bedtime. (Patient not taking: Reported on 10/30/2023) 60 tablet 1   No current facility-administered medications for this visit.    Review of Systems: GENERAL: negative for malaise, night sweats HEENT: No changes in hearing or vision, no nose bleeds or other nasal problems. NECK: Negative for lumps, goiter, pain and significant neck swelling RESPIRATORY: Negative for cough, wheezing CARDIOVASCULAR: Negative for chest pain, leg swelling, palpitations, orthopnea GI: SEE HPI MUSCULOSKELETAL: Negative for joint pain or swelling, back pain, and muscle pain. SKIN: Negative for lesions, rash PSYCH: Negative for sleep disturbance, mood disorder and recent psychosocial stressors. HEMATOLOGY Negative for  prolonged bleeding, bruising easily, and swollen nodes. ENDOCRINE: Negative for cold or heat intolerance, polyuria, polydipsia and goiter. NEURO: negative for tremor, gait imbalance, syncope and seizures. The remainder of the review of systems is noncontributory.   Physical Exam: BP (!) 102/59 (BP Location:  Left Arm, Patient Position: Sitting, Cuff Size: Normal)   Pulse 85   Temp 98 F (36.7 C) (Temporal)   Ht 5\' 6"  (1.676 m)   Wt 132 lb 3.2 oz (60 kg)   BMI 21.34 kg/m  GENERAL: The patient is AO x3, in no acute distress. HEENT: Head is normocephalic and atraumatic. EOMI are intact. Mouth is well hydrated and without lesions. NECK: Supple. No masses LUNGS: Clear to auscultation. No presence of rhonchi/wheezing/rales. Adequate chest expansion HEART: RRR, normal s1 and s2. ABDOMEN: Mildly tender to palpation in the upper abdomen, no guarding, no peritoneal signs, and nondistended. BS +. No masses. EXTREMITIES: Without any cyanosis, clubbing, rash, lesions or edema. NEUROLOGIC: AOx3, no focal motor deficit. SKIN: no jaundice, no rashes  Imaging/Labs: as above  I personally reviewed and interpreted the available labs, imaging and endoscopic files.  Impression and Plan: Stacey Lawrence is a 73 y.o. female with past medical history of anemia, anxiety, CAD, fibromyalgia, GERD, IBS, previous MI, Raynaud's, scoliosis, who presents for evaluation of constipation and abdominal pain.  Patient has presented longstanding intermittent episodes of abdominal pain and chronic constipation.  Cross-sectional abdominal imaging and endoscopic evaluations in the past have been unremarkable.  She has been under significant stress which could be exacerbating some of her chronic symptoms, likely functional in nature.  We discussed in detail that we could try her dose of Linzess  to relieve her IBS-C symptoms.  Patient is agreeable to proceed with this.  She is not presenting with classical symptoms of  intestinal angina, but if symptoms persist with postprandial pain, Lawrence consider performing a CT angio of the abdomen and pelvis  -Start Linzess  290 mcg every day -If after 2 to 3 weeks of taking Linzess , you do not see any improvement, start taking Miralax 1 capful every day for one week. If bowel movements do not improve, increase to 2 capfuls every day. If after two weeks there is no improvement, increase to 2 capfuls in AM and one at night. -Lawrence consider CT angio of the abdomen and pelvis if persistent symptoms  All questions were answered.      Samantha Cress, MD Gastroenterology and Hepatology [facility]astroenterology

## 2023-10-30 NOTE — Patient Instructions (Addendum)
 Start Linzess  290 mcg every day If after 2 to 3 weeks of taking Linzess , you do not see any improvement, start taking Miralax 1 capful every day for one week. If bowel movements do not improve, increase to 2 capfuls every day. If after two weeks there is no improvement, increase to 2 capfuls in AM and one at night. May consider CT angio of the abdomen and pelvis if persistent symptoms   ---- Linzess  works best when taken once a day every day, on an empty stomach, at least 30 minutes before your first meal of the day.  When Linzess  is taken daily as directed:  *Constipation relief is typically felt in about a week *IBS-C patients may begin to experience relief from belly pain and overall abdominal symptoms (pain, discomfort, and bloating) in about 1 week,   with symptoms typically improving over 12 weeks.  Diarrhea, nausea or abdominal cramping may occur in the first 2 weeks -keep taking it.  These symptoms should eventually resolve. Please notify us  if having more than 4 watery bowel movements per day or fecal soiling accidents.

## 2023-10-31 ENCOUNTER — Other Ambulatory Visit (HOSPITAL_COMMUNITY): Payer: Self-pay

## 2023-11-02 ENCOUNTER — Other Ambulatory Visit (HOSPITAL_COMMUNITY): Payer: Self-pay

## 2023-11-03 ENCOUNTER — Other Ambulatory Visit (HOSPITAL_COMMUNITY): Payer: Self-pay

## 2023-11-03 ENCOUNTER — Encounter (HOSPITAL_COMMUNITY): Payer: Self-pay

## 2023-12-07 DIAGNOSIS — R300 Dysuria: Secondary | ICD-10-CM | POA: Diagnosis not present

## 2023-12-12 DIAGNOSIS — N3941 Urge incontinence: Secondary | ICD-10-CM | POA: Diagnosis not present

## 2023-12-12 DIAGNOSIS — M6281 Muscle weakness (generalized): Secondary | ICD-10-CM | POA: Diagnosis not present

## 2023-12-12 DIAGNOSIS — K5900 Constipation, unspecified: Secondary | ICD-10-CM | POA: Diagnosis not present

## 2023-12-12 DIAGNOSIS — M62838 Other muscle spasm: Secondary | ICD-10-CM | POA: Diagnosis not present

## 2023-12-12 DIAGNOSIS — M6289 Other specified disorders of muscle: Secondary | ICD-10-CM | POA: Diagnosis not present

## 2023-12-25 DIAGNOSIS — N898 Other specified noninflammatory disorders of vagina: Secondary | ICD-10-CM | POA: Diagnosis not present

## 2023-12-25 DIAGNOSIS — N951 Menopausal and female climacteric states: Secondary | ICD-10-CM | POA: Diagnosis not present

## 2023-12-29 ENCOUNTER — Other Ambulatory Visit (HOSPITAL_COMMUNITY): Payer: Self-pay

## 2023-12-29 DIAGNOSIS — M4180 Other forms of scoliosis, site unspecified: Secondary | ICD-10-CM | POA: Diagnosis not present

## 2023-12-29 DIAGNOSIS — M5416 Radiculopathy, lumbar region: Secondary | ICD-10-CM | POA: Diagnosis not present

## 2023-12-29 DIAGNOSIS — M5459 Other low back pain: Secondary | ICD-10-CM | POA: Diagnosis not present

## 2023-12-29 MED ORDER — METHYLPREDNISOLONE 4 MG PO TBPK
ORAL_TABLET | ORAL | 0 refills | Status: DC
  Filled 2023-12-29: qty 21, 6d supply, fill #0

## 2024-01-02 DIAGNOSIS — H52223 Regular astigmatism, bilateral: Secondary | ICD-10-CM | POA: Diagnosis not present

## 2024-01-02 DIAGNOSIS — H2513 Age-related nuclear cataract, bilateral: Secondary | ICD-10-CM | POA: Diagnosis not present

## 2024-01-02 DIAGNOSIS — H43811 Vitreous degeneration, right eye: Secondary | ICD-10-CM | POA: Diagnosis not present

## 2024-01-02 DIAGNOSIS — H35372 Puckering of macula, left eye: Secondary | ICD-10-CM | POA: Diagnosis not present

## 2024-01-02 DIAGNOSIS — H04123 Dry eye syndrome of bilateral lacrimal glands: Secondary | ICD-10-CM | POA: Diagnosis not present

## 2024-01-02 DIAGNOSIS — H524 Presbyopia: Secondary | ICD-10-CM | POA: Diagnosis not present

## 2024-01-02 DIAGNOSIS — H02831 Dermatochalasis of right upper eyelid: Secondary | ICD-10-CM | POA: Diagnosis not present

## 2024-01-02 DIAGNOSIS — H5203 Hypermetropia, bilateral: Secondary | ICD-10-CM | POA: Diagnosis not present

## 2024-01-02 DIAGNOSIS — H02834 Dermatochalasis of left upper eyelid: Secondary | ICD-10-CM | POA: Diagnosis not present

## 2024-01-04 DIAGNOSIS — M6289 Other specified disorders of muscle: Secondary | ICD-10-CM | POA: Diagnosis not present

## 2024-01-04 DIAGNOSIS — M62838 Other muscle spasm: Secondary | ICD-10-CM | POA: Diagnosis not present

## 2024-01-04 DIAGNOSIS — M6281 Muscle weakness (generalized): Secondary | ICD-10-CM | POA: Diagnosis not present

## 2024-01-04 DIAGNOSIS — K5900 Constipation, unspecified: Secondary | ICD-10-CM | POA: Diagnosis not present

## 2024-01-04 DIAGNOSIS — N3941 Urge incontinence: Secondary | ICD-10-CM | POA: Diagnosis not present

## 2024-01-08 ENCOUNTER — Other Ambulatory Visit (HOSPITAL_COMMUNITY): Payer: Self-pay

## 2024-01-08 MED ORDER — MIEBO 1.338 GM/ML OP SOLN
1.0000 [drp] | Freq: Four times a day (QID) | OPHTHALMIC | 0 refills | Status: DC
  Filled 2024-01-08: qty 3, 8d supply, fill #0

## 2024-01-09 ENCOUNTER — Other Ambulatory Visit (HOSPITAL_COMMUNITY): Payer: Self-pay

## 2024-01-10 DIAGNOSIS — M1812 Unilateral primary osteoarthritis of first carpometacarpal joint, left hand: Secondary | ICD-10-CM | POA: Diagnosis not present

## 2024-01-10 DIAGNOSIS — M65311 Trigger thumb, right thumb: Secondary | ICD-10-CM | POA: Diagnosis not present

## 2024-01-18 DIAGNOSIS — M5416 Radiculopathy, lumbar region: Secondary | ICD-10-CM | POA: Diagnosis not present

## 2024-01-30 ENCOUNTER — Other Ambulatory Visit (HOSPITAL_COMMUNITY): Payer: Self-pay

## 2024-01-30 DIAGNOSIS — N3941 Urge incontinence: Secondary | ICD-10-CM | POA: Diagnosis not present

## 2024-01-30 DIAGNOSIS — R300 Dysuria: Secondary | ICD-10-CM | POA: Diagnosis not present

## 2024-01-30 MED ORDER — ESTRADIOL 0.1 MG/GM VA CREA
TOPICAL_CREAM | VAGINAL | 3 refills | Status: DC
  Filled 2024-01-30: qty 42.5, 100d supply, fill #0

## 2024-01-31 DIAGNOSIS — E782 Mixed hyperlipidemia: Secondary | ICD-10-CM | POA: Diagnosis not present

## 2024-01-31 DIAGNOSIS — R7301 Impaired fasting glucose: Secondary | ICD-10-CM | POA: Diagnosis not present

## 2024-02-06 DIAGNOSIS — G43009 Migraine without aura, not intractable, without status migrainosus: Secondary | ICD-10-CM | POA: Diagnosis not present

## 2024-02-06 DIAGNOSIS — R7301 Impaired fasting glucose: Secondary | ICD-10-CM | POA: Diagnosis not present

## 2024-02-06 DIAGNOSIS — R634 Abnormal weight loss: Secondary | ICD-10-CM | POA: Diagnosis not present

## 2024-02-06 DIAGNOSIS — E782 Mixed hyperlipidemia: Secondary | ICD-10-CM | POA: Diagnosis not present

## 2024-02-06 DIAGNOSIS — R1310 Dysphagia, unspecified: Secondary | ICD-10-CM | POA: Diagnosis not present

## 2024-02-06 DIAGNOSIS — F332 Major depressive disorder, recurrent severe without psychotic features: Secondary | ICD-10-CM | POA: Diagnosis not present

## 2024-02-06 DIAGNOSIS — K219 Gastro-esophageal reflux disease without esophagitis: Secondary | ICD-10-CM | POA: Diagnosis not present

## 2024-02-06 DIAGNOSIS — R251 Tremor, unspecified: Secondary | ICD-10-CM | POA: Diagnosis not present

## 2024-02-06 DIAGNOSIS — M797 Fibromyalgia: Secondary | ICD-10-CM | POA: Diagnosis not present

## 2024-02-20 ENCOUNTER — Other Ambulatory Visit (HOSPITAL_COMMUNITY): Payer: Self-pay

## 2024-02-20 ENCOUNTER — Other Ambulatory Visit (HOSPITAL_COMMUNITY): Payer: Self-pay | Admitting: Occupational Therapy

## 2024-02-20 ENCOUNTER — Ambulatory Visit (INDEPENDENT_AMBULATORY_CARE_PROVIDER_SITE_OTHER): Admitting: Gastroenterology

## 2024-02-20 ENCOUNTER — Encounter (INDEPENDENT_AMBULATORY_CARE_PROVIDER_SITE_OTHER): Payer: Self-pay | Admitting: Gastroenterology

## 2024-02-20 ENCOUNTER — Other Ambulatory Visit: Payer: Self-pay

## 2024-02-20 VITALS — BP 98/60 | HR 64 | Temp 97.8°F | Ht 66.0 in | Wt 137.1 lb

## 2024-02-20 DIAGNOSIS — K219 Gastro-esophageal reflux disease without esophagitis: Secondary | ICD-10-CM

## 2024-02-20 DIAGNOSIS — R1310 Dysphagia, unspecified: Secondary | ICD-10-CM

## 2024-02-20 DIAGNOSIS — R1312 Dysphagia, oropharyngeal phase: Secondary | ICD-10-CM

## 2024-02-20 DIAGNOSIS — K581 Irritable bowel syndrome with constipation: Secondary | ICD-10-CM | POA: Diagnosis not present

## 2024-02-20 DIAGNOSIS — G8929 Other chronic pain: Secondary | ICD-10-CM

## 2024-02-20 MED ORDER — OMEPRAZOLE 40 MG PO CPDR
40.0000 mg | DELAYED_RELEASE_CAPSULE | Freq: Every day | ORAL | 1 refills | Status: DC
  Filled 2024-02-20: qty 90, 90d supply, fill #0

## 2024-02-20 MED ORDER — LUBIPROSTONE 24 MCG PO CAPS
24.0000 ug | ORAL_CAPSULE | Freq: Two times a day (BID) | ORAL | 1 refills | Status: AC
Start: 2024-02-20 — End: ?
  Filled 2024-02-20: qty 60, 30d supply, fill #0
  Filled 2024-07-01: qty 60, 30d supply, fill #1

## 2024-02-20 NOTE — Patient Instructions (Signed)
 We will restart omeprazole  40mg  daily for GERD Be mindful of spicy, tomato/citrus based foods, caffeine, chocolate and alcohol  Stay upright 2-3 hours after eating prior to laying down  We will schedule barium swallow study I have sent amitiza  to take twice daily for constipation Increase water  intake, aim for atleast 64 oz per day Increase fruits, veggies and whole grains, kiwi and prunes are especially good for constipation  Follow up 4 months  It was a pleasure to see you today. I want to create trusting relationships with patients and provide genuine, compassionate, and quality care. I truly value your feedback! please be on the lookout for a survey regarding your visit with me today. I appreciate your input about our visit and your time in completing this!    Kandi Brusseau L. Dahlia Nifong, MSN, APRN, AGNP-C Adult-Gerontology Nurse Practitioner [facility] Gastroenterology at [facility]

## 2024-02-20 NOTE — Progress Notes (Addendum)
 Referring Provider: Omie Bickers, MD Primary Care Physician:  Omie Bickers, MD Primary GI Physician: Dr. Sammi Crick   Chief Complaint  Patient presents with   Dysphagia    Food is getting hung.   Constipation    Pt is having chronic constipation.   HPI:   Stacey Lawrence is a 73 y.o. female with past medical history of anemia, anxiety, CAD, fibromyalgia, GERD, IBS, previous MI, Raynaud's, scoliosis   Patient presenting today for:  Dysphagia Constipation  GERD Abdominal pain   Last seen in February by Dr. Sammi Crick, at that time having decrease apeptite. Upper abdominal pain. Rare BMs, taking colace x3, having a BM weekly.   Recommended to start linzess  290mcg daily, can add miralax if not improving, consider CT angio if abdominal pain persisting   Present: States that she was previously on prilosec but ran out, taking prilosec otc just occasionally when she has reflux symptoms though does not think this makes much difference. She has less symptoms on a bland diet but anything spicy makes her have her heartburn. She reports she is having some dysphagia especially with drier foods, sometimes liquids make her "strangled." This occurs maybe 3-4 times per week. taking tums whenever she is going to eat something spicy.  She did not get linzess  as it was $400. Taking colace as directed by her PCP, using this daily but not really seeing much results. States she took 5 colace and had a small BM. Having a BM once weekly. Water  intake is good. Is doing prune juice and grape juice. No blood in stools or black stools.   Weight is actually up some from last visit. Still with some abdominal pain, occasionally after eating. Appetite is not great but she avoid junk food.   Previous GI workup: -Gastric emptying study 06/22/2022  - increased gastric emptying, question hypermotility versus dumping syndrome -CT chest, abdomen and pelvis with IV contrast on 07/01/2022: Benign hepatic cysts, status  postcholecystectomy and hysterectomy   EGD: 05/2022:- No endoscopic esophageal abnormality to explain                            patient's dysphagia. Esophagus dilated. Dilated.                           - 2 cm hiatal hernia.                           - Gastroesophageal flap valve classified as Hill                            Grade IV (no fold, wide open lumen, hiatal hernia                            present).                           - Normal stomach. Biopsied.                           - Normal examined duodenum. Last Colonoscopy:08/11/21 - Diverticulosis in the sigmoid colon and in the transverse colon. - Two small polyps at the splenic flexure and in the transverse colon-2 tubular adenomas and  one hyperplastic polyp.  - One 4 to 8 mm polyp at the splenic flexure, removed with a cold snare. Resected and retrieved. - External hemorrhoids.   Recommend to repeat colonoscopy in 5 years Filed Weights   02/20/24 0945  Weight: 137 lb 1.6 oz (62.2 kg)     Past Medical History:  Diagnosis Date   Anemia    hx   Anxiety    CAD (coronary artery disease)    a. 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 05/2013 Lexi CL: EF 77%, old small inferolat scar, no ischemia.   CHF (congestive heart failure) (HCC)    Common migraine with intractable migraine 06/16/2017   Complication of anesthesia    dificulty waking up   Depression    Fibromyalgia    GERD (gastroesophageal reflux disease)    hasn't started taking any meds   History of blood transfusion    14yrs ago   History of kidney stones    Hx of colonic polyps    IBS (irritable bowel syndrome)    constipation   Joint pain    Joint swelling    Migraine    "q other week" (02/04/2016)   Myocardial  infarction (HCC) 2014   Numbness    right leg   Polycythemia    PONV (postoperative nausea and vomiting)    Raynaud's disease    Sciatica of right side 04/21/2020   Scoliosis    Shortness of breath dyspnea    with exertion   SI (sacroiliac) joint dysfunction    right   Wears glasses     Past Surgical History:  Procedure Laterality Date   ABDOMINAL HYSTERECTOMY     partial   APPENDECTOMY     with explor. lap.   BIOPSY  05/31/2022   Procedure: BIOPSY;  Surgeon: Urban Garden, MD;  Location: [facility];  Service: Gastroenterology;;   CARDIAC CATHETERIZATION     CHOLECYSTECTOMY  10/28/2002   lap.   COLONOSCOPY     COLONOSCOPY WITH PROPOFOL  N/A 08/11/2021   Procedure: COLONOSCOPY WITH PROPOFOL ;  Surgeon: Ruby Corporal, MD;  Location: [facility];  Service: Endoscopy;  Laterality: N/A;  8:30   DEBRIDEMENT TENNIS ELBOW     DIAGNOSTIC LAPAROSCOPY     DILATION AND CURETTAGE OF UTERUS     ELBOW SURGERY     golfer's elbow-bilateral   ESOPHAGEAL DILATION N/A 05/31/2022   Procedure: ESOPHAGEAL DILATION;  Surgeon: Urban Garden, MD;  Location: [facility];  Service: Gastroenterology;  Laterality: N/A;   ESOPHAGOGASTRODUODENOSCOPY  09/10/2012   Procedure: ESOPHAGOGASTRODUODENOSCOPY (EGD);  Surgeon: Ruby Corporal, MD;  Location: [facility];  Service: Endoscopy;  Laterality: N/A;  730   ESOPHAGOGASTRODUODENOSCOPY (EGD) WITH PROPOFOL  N/A 05/31/2022   Procedure: ESOPHAGOGASTRODUODENOSCOPY (EGD) WITH PROPOFOL ;  Surgeon: Urban Garden, MD;  Location: [facility];  Service: Gastroenterology;  Laterality: N/A;  205 ASA 3   EXCISION/RELEASE BURSA HIP Left 02/04/2016   Procedure: LEFT HIP TROCHANTERIC BURSECTOMY;  Surgeon: Arnie Lao, MD;  Location: [facility];  Service: Orthopedics;  Laterality: Left;   I & D EXTREMITY  02/25/2012   Procedure: IRRIGATION AND DEBRIDEMENT EXTREMITY;  Surgeon: Ronn Cohn, MD;  Location:  [facility] OR;  Service:  Orthopedics;  Laterality: Right;  Irrigation and Debridement and Repair Right Thumb Nerve Laceration and Assoiated Structures    KNEE DEBRIDEMENT     right   LAPAROTOMY     LEFT HEART CATHETERIZATION WITH CORONARY ANGIOGRAM N/A 01/16/2013   Procedure: LEFT HEART CATHETERIZATION WITH CORONARY ANGIOGRAM;  Surgeon: Mardell Shade, MD;  Location: [facility] CATH LAB;  Service: Cardiovascular;  Laterality: N/A;   POLYPECTOMY  08/11/2021   Procedure: POLYPECTOMY;  Surgeon: Ruby Corporal, MD;  Location: [facility];  Service: Endoscopy;;   SACROILIAC JOINT FUSION Right 05/02/2019   Procedure: SACROILIAC JOINT FUSION;  Surgeon: Mort Ards, MD;  Location: [facility] OR;  Service: Orthopedics;  Laterality: Right;  SACROILIAC JOINT FUSION   STAPEDECTOMY     right   TONSILLECTOMY AND ADENOIDECTOMY     TRIGGER FINGER RELEASE  08/19/2011   Procedure: RELEASE TRIGGER FINGER/A-1 PULLEY;  Surgeon: Isom Marin III;  Location: [facility];  Service: Orthopedics;  Laterality: Right;  right middle finger a-1 pulley release with tenosynovectomy   TRIGGER FINGER RELEASE     x 5   TROCHANTERIC BURSA EXCISION Right 02/04/2016   TUBAL LIGATION     TYMPANOPLASTY      Current Outpatient Medications  Medication Sig Dispense Refill   BIOTIN PO Take 1 tablet by mouth daily.     Cholecalciferol (VITAMIN D3) 50 MCG (2000 UT) TABS Take 2,000 Units by mouth daily.     clotrimazole -betamethasone  (LOTRISONE ) cream Apply topically once daily as directed. 30 g 0   famotidine  (PEPCID ) 20 MG tablet Take 1 tablet (20 mg total) by mouth at bedtime. 60 tablet 1   Turmeric (QC TUMERIC COMPLEX PO) Take 1,500 mg by mouth daily.     No current facility-administered medications for this visit.    Allergies as of 02/20/2024 - Review Complete 02/20/2024  Allergen Reaction Noted   Dilaudid  [hydromorphone  hcl] Other (See Comments) 07/27/2011   Codeine Nausea And Vomiting and Rash 07/27/2011   Morphine and codeine  Nausea And Vomiting 02/24/2012    Social History   Socioeconomic History   Marital status: Married    Spouse name: Not on file   Number of children: 2   Years of education: 14   Highest education level: Not on file  Occupational History   Not on file  Tobacco Use   Smoking status: Never    Passive exposure: Never   Smokeless tobacco: Never  Vaping Use   Vaping status: Never Used  Substance and Sexual Activity   Alcohol use: No    Alcohol/week: 0.0 standard drinks of alcohol   Drug use: No   Sexual activity: Not Currently    Birth control/protection: Surgical  Other Topics Concern   Not on file  Social History Narrative   Lives in [place] with husband and her mother in law lives with them   Grown children.     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    Caffeine use: seldom drinks green tea   Social Drivers of Corporate investment banker Strain: Not on file  Food Insecurity: Not on file  Transportation Needs: Not on file  Physical Activity: Not on file  Stress: Not on file  Social Connections: Not on file    Review of systems General: negative for malaise, night sweats, fever, chills, weight loss Neck: Negative for lumps, goiter, pain and significant neck swelling Resp: Negative for cough,  wheezing, dyspnea at rest CV: Negative for chest pain, leg swelling, palpitations, orthopnea GI: denies melena, hematochezia, nausea, vomiting, diarrhea, odyonophagia, early satiety or unintentional weight loss. +dysphagia +constipation +GERD MSK: Negative for joint pain or swelling, back pain, and muscle pain. Derm: Negative for itching or rash Psych: Denies depression, anxiety, memory loss, confusion. No homicidal or suicidal ideation.  Heme: Negative for prolonged bleeding, bruising easily, and swollen nodes. Endocrine: Negative for cold or heat intolerance, polyuria, polydipsia and goiter. Neuro: negative for tremor, gait imbalance, syncope and  seizures. The remainder of the review of systems is noncontributory.  Physical Exam: BP 98/60 (BP Location: Right Arm, Patient Position: Sitting, Cuff Size: Normal)   Pulse 64   Temp 97.8 F (36.6 C) (Oral)   Ht 5\' 6"  (1.676 m)   Wt 137 lb 1.6 oz (62.2 kg)   SpO2 96%   BMI 22.13 kg/m  General:   Alert and oriented. No distress noted. Pleasant and cooperative.  Head:  Normocephalic and atraumatic. Eyes:  Conjuctiva clear without scleral icterus. Mouth:  Oral mucosa pink and moist. Good dentition. No lesions. Heart: Normal rate and rhythm, s1 and s2 heart sounds present.  Lungs: Clear lung sounds in all lobes. Respirations equal and unlabored. Abdomen:  +BS, soft,  and non-distended. Generalized tenderness to palpation of abdomen. No rebound or guarding. No HSM or masses noted. Derm: No palmar erythema or jaundice Msk:  Symmetrical without gross deformities. Normal posture. Extremities:  Without edema. Neurologic:  Alert and  oriented x4 Psych:  Alert and cooperative. Normal mood and affect.  Invalid input(s): "6 MONTHS"   ASSESSMENT: JENNALEE GREAVES is a 73 y.o. female presenting today for follow up of constipation, GERD, dysphagia, abdominal pain   IBS-C: has presented with longstanding, intermittent episodes of constipation and abdominal pain.  Cross-sectional imaging evaluation in the past unremarkable.  She has been notably under significant stress which is likely exacerbating her chronic symptoms that are likely functional in nature.  Reassuringly she has gained a little weight since her last visit.  She did not get Linzess  as prescribed at last visit as it was too expensive and continues to constipation.  Will try Amitiza  24 mcg twice daily.  She is not presenting with classical symptoms of intestinal angina, if symptoms persist despite good control of her constipation and stress, consider performing CT angio abdomen pelvis.  GERD/dysphagia: More issues with reflux recently  especially if she eats something spicy.  She ran out of prescription strength omeprazole  and is taking over-the-counter omeprazole  only occasionally as well as Tums.  She is having more dysphagia again, but mostly issues with choking on liquids.  She noted some mild improvement after esophageal dilation in 2023 but not complete resolution.  We discussed repeat upper endoscopy but she does not really wish to pursue this at this time, can proceed with barium swallow study with SLP evaluation as it would be important to rule out aspiration given issues with swallowing liquids.   PLAN:  -start omeprazole  40mg  daily -good reflux precautions -amitiza  24mcg BID -Increase water  intake, aim for atleast 64 oz per day -Increase fruits, veggies and whole grains, kiwi and prunes are especially good for constipation -barium swallow with SLP evaluation   All questions were answered, patient verbalized understanding and is in agreement with plan as outlined above.   Follow Up: 4 months   Rayona Sardinha L. Adrien Alberta, MSN, APRN, AGNP-C Adult-Gerontology Nurse Practitioner [facility]   I have reviewed the note  and agree with the APP's assessment as described in this progress note  If patient presents persistent episodes of dysphagia with negative imaging abnormalities, may consider eventual esophageal manometry.  Samantha Cress, MD Gastroenterology and Hepatology [facility]

## 2024-03-11 ENCOUNTER — Encounter (HOSPITAL_COMMUNITY): Payer: Self-pay | Admitting: Speech Pathology

## 2024-03-11 ENCOUNTER — Ambulatory Visit (HOSPITAL_COMMUNITY)
Admission: RE | Admit: 2024-03-11 | Discharge: 2024-03-11 | Disposition: A | Discharge status: Home / Self Care | Source: Ambulatory Visit | Attending: Gastroenterology | Admitting: Gastroenterology

## 2024-03-11 ENCOUNTER — Ambulatory Visit (HOSPITAL_COMMUNITY): Attending: Gastroenterology | Admitting: Speech Pathology

## 2024-03-11 ENCOUNTER — Other Ambulatory Visit: Payer: Self-pay

## 2024-03-11 DIAGNOSIS — R1312 Dysphagia, oropharyngeal phase: Secondary | ICD-10-CM | POA: Insufficient documentation

## 2024-03-11 DIAGNOSIS — K219 Gastro-esophageal reflux disease without esophagitis: Secondary | ICD-10-CM | POA: Insufficient documentation

## 2024-03-11 DIAGNOSIS — R059 Cough, unspecified: Secondary | ICD-10-CM | POA: Diagnosis not present

## 2024-03-11 NOTE — Therapy (Signed)
 Modified Barium Swallow Study  Patient Details  Name: Stacey Lawrence MRN: [id number] Date of Birth: June 14, 1951  Today's Date: 03/11/2024  HPI/PMH: HPI: Stacey Lawrence is a 73 yo female who was referred for MBSS by Mitzie Boettcher, NP (GI) due to Pt reports of strangling and coughing with liquids at times. She has xerostomia from Sjogren's. She has a past medical history of anemia, anxiety, CAD, fibromyalgia, GERD, IBS, previous MI, Raynaud's, scoliosis. She reports she is having some dysphagia especially with drier foods, sometimes liquids make her strangled. Gastric emptying study 06/22/2022 - increased gastric emptying, question hypermotility versus dumping syndrome.  GI note:  <<GERD/dysphagia: More issues with reflux recently especially if she eats something spicy.  She ran out of prescription strength omeprazole  and is taking over-the-counter omeprazole  only occasionally as well as Tums.  She is having more dysphagia again, but mostly issues with choking on liquids.  She noted some mild improvement after esophageal dilation in 2023 but not complete resolution.  We discussed repeat upper endoscopy but she does not really wish to pursue this at this time, can proceed with barium swallow study with SLP evaluation as it would be important to rule out aspiration given issues with swallowing liquids.>>  Diagnosis: R13.10 (ICD-10-CM) - Dysphagia K21.9 (ICD-10-CM) - GERD (gastroesophageal reflux disease)  Clinical Impression: Clinical Impression: MBSS completed as an outpatient at [facility] and Pt presents with mild oral phase and normal pharyngoesophageal swallow characterized by piecemeal deglution with regular textures and honey thick liquids in setting of xerostomia also resulating in min lingual residue which then drips to the valleculae and is spontaneously swallowed. Pt with timely swallow initiation and hyolaryngeal excursion and no penetration, aspiration, or significant pharyngeal  residuals noted. Pt had one episode of coughing/throat clearing when swallowing the graham crackers, however nothing observed on imaging. She tipped her head back when taking the barium tablet with cup sips of barium, but otherwise WNL. Esophageal sweep was unremarkable. Suspect that Pt's dysphagia reports are due to xerostomia. She does not use any artificial saliva products, but does use a droplet to administer some olive oil to oral cavity to moisten as needed. She endorses drinking over 100 ounces of water  per day. She reports improvement in her dysphagia symptoms after EGD with dilation in 2023. She currently reports getting strangled and coughing with liquids and difficulty swallowing large pills. Pt can try taking large pills in jelly or a puree item and follow with liquid wash to see if this helps (she swallowed the barium tablet without incident today). Recommend regular textures and all liquids and consider adding moisture to dry solids and sip on water  throughout the day (consider artificial saliva products). This study and recommendations were reviewed with Pt. She was given my contact information, should she have any further questions or concers. No further SLP services indicated at this time.  Factors that may increase risk of adverse event in presence of aspiration Noe & Lianne 2021): No data recorded  Recommendations/Plan: Swallowing Evaluation Recommendations Swallowing Evaluation Recommendations Recommendations: PO diet PO Diet Recommendation: Regular; Thin liquids (Level 0) Liquid Administration via: Cup; Straw Medication Administration: Whole meds with liquid Supervision: Patient able to self-feed Postural changes: Position pt fully upright for meals; Stay upright 30-60 min after meals Oral care recommendations: Oral care BID (2x/day); Pt independent with oral care   Treatment Plan Treatment Plan Treatment recommendations: No treatment recommended at this time Follow-up  recommendations: No SLP follow up   Recommendations Recommendations for follow up therapy  are one component of a multi-disciplinary discharge planning process, led by the attending physician.  Recommendations may be updated based on patient status, additional functional criteria and insurance authorization.  Assessment: Orofacial Exam: Orofacial Exam Oral Cavity: Oral Hygiene: Xerostomia Oral Cavity - Dentition: Adequate natural dentition Orofacial Anatomy: WFL Oral Motor/Sensory Function: WFL   Anatomy:  Anatomy: WFL  Boluses Administered: Boluses Administered Boluses Administered: Thin liquids (Level 0); Mildly thick liquids (Level 2, nectar thick); Moderately thick liquids (Level 3, honey thick); Puree; Solid    Oral Impairment Domain: Oral Impairment Domain Lip Closure: No labial escape Tongue control during bolus hold: Cohesive bolus between tongue to palatal seal Bolus preparation/mastication: Slow prolonged chewing/mashing with complete recollection Bolus transport/lingual motion: Brisk tongue motion Oral residue: Trace residue lining oral structures Location of oral residue : Tongue Initiation of pharyngeal swallow : Posterior angle of the ramus; Valleculae    Pharyngeal Impairment Domain: Pharyngeal Impairment Domain Soft palate elevation: No bolus between soft palate (SP)/pharyngeal wall (PW) Laryngeal elevation: Complete superior movement of thyroid  cartilage with complete approximation of arytenoids to epiglottic petiole Anterior hyoid excursion: Complete anterior movement Epiglottic movement: Complete inversion Laryngeal vestibule closure: Complete, no air/contrast in laryngeal vestibule Pharyngeal stripping wave : Present - complete Pharyngeal contraction (A/P view only): Complete Pharyngoesophageal segment opening: Complete distension and complete duration, no obstruction of flow Tongue base retraction: No contrast between tongue base and posterior  pharyngeal wall (PPW) Pharyngeal residue: Complete pharyngeal clearance    Esophageal Impairment Domain: Esophageal Impairment Domain Esophageal clearance upright position: Complete clearance, esophageal coating   Pill: Pill Consistency administered: Thin liquids (Level 0) Thin liquids (Level 0): [facility]   Penetration/Aspiration Scale Score: Penetration/Aspiration Scale Score 1.  Material does not enter airway: Thin liquids (Level 0); Mildly thick liquids (Level 2, nectar thick); Moderately thick liquids (Level 3, honey thick); Puree; Solid; Pill   Compensatory Strategies: Compensatory Strategies Compensatory strategies: No     General Information: Caregiver present: No   Diet Prior to this Study: Regular; Thin liquids (Level 0)    Temperature : Normal    Respiratory Status: WFL    Supplemental O2: None (Room air)    History of Recent Intubation: No   Behavior/Cognition: Alert; Cooperative; Pleasant mood  Self-Feeding Abilities: Able to self-feed  Baseline vocal quality/speech: Normal  Volitional Cough: Able to elicit  Volitional Swallow: Able to elicit  Exam Limitations: No limitations   Pain: Pain Assessment Pain Assessment: No/denies pain   End of Session: Start Time:No data recorded Stop Time: No data recorded Time Calculation:No data recorded Charges: No data recorded SLP visit diagnosis: SLP Visit Diagnosis: Dysphagia, oropharyngeal phase (R13.12)    Past Medical History:  Past Medical History:  Diagnosis Date   Anemia    hx   Anxiety    CAD (coronary artery disease)    a. 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 05/2013 [facility] CL: EF 77%, old small inferolat scar, no ischemia.   CHF (congestive heart  failure) (HCC)    Common migraine with intractable migraine 06/16/2017   Complication of anesthesia    dificulty waking up   Depression    Fibromyalgia    GERD (gastroesophageal reflux disease)    hasn't started  taking any meds   History of blood transfusion    36yrs ago   History of kidney stones    Hx of colonic polyps    IBS (irritable bowel syndrome)    constipation   Joint pain    Joint swelling    Migraine    q other week (02/04/2016)   Myocardial infarction [facility]) 2014   Numbness    right leg   Polycythemia    PONV (postoperative nausea and vomiting)    Raynaud's disease    Sciatica of right side 04/21/2020   Scoliosis    Shortness of breath dyspnea    with exertion   SI (sacroiliac) joint dysfunction    right   Wears glasses    Past Surgical History:  Past Surgical History:  Procedure Laterality Date   ABDOMINAL HYSTERECTOMY     partial   APPENDECTOMY     with explor. lap.   BIOPSY  05/31/2022   Procedure: BIOPSY;  Surgeon: Eartha Angelia Sieving, MD;  [facility]: [facility];  Service: Gastroenterology;;   CARDIAC CATHETERIZATION     CHOLECYSTECTOMY  10/28/2002   lap.   COLONOSCOPY     COLONOSCOPY WITH PROPOFOL  N/A 08/11/2021   Procedure: COLONOSCOPY WITH PROPOFOL ;  Surgeon: Golda Claudis PENNER, MD;  Location: [facility];  Service: Endoscopy;  Laterality: N/A;  8:30   DEBRIDEMENT TENNIS ELBOW     DIAGNOSTIC LAPAROSCOPY     DILATION AND CURETTAGE OF UTERUS     ELBOW SURGERY     golfer's elbow-bilateral   ESOPHAGEAL DILATION N/A 05/31/2022   Procedure: ESOPHAGEAL DILATION;  Surgeon: Eartha Angelia Sieving, MD;  [facility]: [facility];  Service: Gastroenterology;  Laterality: N/A;   ESOPHAGOGASTRODUODENOSCOPY  09/10/2012   Procedure: ESOPHAGOGASTRODUODENOSCOPY (EGD);  Surgeon: Claudis PENNER Golda, MD;  [facility]: [facility];  Service: Endoscopy;  Laterality: N/A;  730   ESOPHAGOGASTRODUODENOSCOPY (EGD) WITH PROPOFOL  N/A 05/31/2022   Procedure:  ESOPHAGOGASTRODUODENOSCOPY (EGD) WITH PROPOFOL ;  Surgeon: Eartha Angelia Sieving, MD;  [facility]: [facility];  Service: Gastroenterology;  Laterality: N/A;  205 ASA 3   EXCISION/RELEASE BURSA HIP Left 02/04/2016   Procedure: LEFT HIP TROCHANTERIC BURSECTOMY;  Surgeon: Lonni CINDERELLA Poli, MD;  Location: [facility] OR;  Service: Orthopedics;  Laterality: Left;   I & D EXTREMITY  02/25/2012   Procedure: IRRIGATION AND DEBRIDEMENT EXTREMITY;  Surgeon: Elsie Mussel, MD;  Location: [facility] OR;  Service: Orthopedics;  Laterality: Right;  Irrigation and Debridement and Repair Right Thumb Nerve Laceration and Assoiated Structures    KNEE DEBRIDEMENT     right   LAPAROTOMY     LEFT HEART CATHETERIZATION WITH CORONARY ANGIOGRAM N/A 01/16/2013   Procedure: LEFT HEART CATHETERIZATION WITH CORONARY ANGIOGRAM;  Surgeon: Sieving JONELLE Fuel, MD;  Location: [facility] CATH LAB;  Service: Cardiovascular;  Laterality: N/A;   POLYPECTOMY  08/11/2021   Procedure: POLYPECTOMY;  Surgeon: Golda Claudis PENNER, MD;  Location: [facility];  Service: Endoscopy;;   SACROILIAC JOINT FUSION Right 05/02/2019   Procedure: SACROILIAC JOINT FUSION;  Surgeon: Burnetta Aures, MD;  Location: [facility] OR;  Service: Orthopedics;  Laterality: Right;  SACROILIAC JOINT FUSION   STAPEDECTOMY     right   TONSILLECTOMY AND ADENOIDECTOMY     TRIGGER FINGER RELEASE  08/19/2011   Procedure: RELEASE TRIGGER FINGER/A-1 PULLEY;  Surgeon: Elsie CHRISTELLA Mussel III;  Location: [facility];  Service: Orthopedics;  Laterality: Right;  right middle finger a-1 pulley release with tenosynovectomy   TRIGGER FINGER RELEASE  x 5   TROCHANTERIC BURSA EXCISION Right 02/04/2016   TUBAL LIGATION     TYMPANOPLASTY     Thank you,  Lamar Candy, CCC-SLP [phone]  Shalona Harbour 03/11/2024, 3:26 PM

## 2024-04-08 ENCOUNTER — Ambulatory Visit (HOSPITAL_BASED_OUTPATIENT_CLINIC_OR_DEPARTMENT_OTHER)

## 2024-04-08 ENCOUNTER — Other Ambulatory Visit (HOSPITAL_COMMUNITY): Payer: Self-pay | Admitting: Family Medicine

## 2024-04-08 DIAGNOSIS — R1032 Left lower quadrant pain: Secondary | ICD-10-CM | POA: Diagnosis not present

## 2024-04-08 DIAGNOSIS — K449 Diaphragmatic hernia without obstruction or gangrene: Secondary | ICD-10-CM | POA: Diagnosis not present

## 2024-04-09 ENCOUNTER — Encounter (INDEPENDENT_AMBULATORY_CARE_PROVIDER_SITE_OTHER): Admitting: Gastroenterology

## 2024-04-09 ENCOUNTER — Ambulatory Visit (HOSPITAL_COMMUNITY)
Admission: RE | Admit: 2024-04-09 | Discharge: 2024-04-09 | Disposition: A | Discharge status: Home / Self Care | Source: Ambulatory Visit | Attending: Family Medicine | Admitting: Family Medicine

## 2024-04-09 DIAGNOSIS — K5732 Diverticulitis of large intestine without perforation or abscess without bleeding: Secondary | ICD-10-CM | POA: Diagnosis not present

## 2024-04-09 DIAGNOSIS — N200 Calculus of kidney: Secondary | ICD-10-CM | POA: Diagnosis not present

## 2024-04-09 DIAGNOSIS — R1032 Left lower quadrant pain: Secondary | ICD-10-CM | POA: Insufficient documentation

## 2024-04-09 DIAGNOSIS — K769 Liver disease, unspecified: Secondary | ICD-10-CM | POA: Diagnosis not present

## 2024-04-09 DIAGNOSIS — N281 Cyst of kidney, acquired: Secondary | ICD-10-CM | POA: Diagnosis not present

## 2024-04-15 DIAGNOSIS — Z85828 Personal history of other malignant neoplasm of skin: Secondary | ICD-10-CM | POA: Diagnosis not present

## 2024-04-15 DIAGNOSIS — D225 Melanocytic nevi of trunk: Secondary | ICD-10-CM | POA: Diagnosis not present

## 2024-04-15 DIAGNOSIS — L814 Other melanin hyperpigmentation: Secondary | ICD-10-CM | POA: Diagnosis not present

## 2024-04-15 DIAGNOSIS — L821 Other seborrheic keratosis: Secondary | ICD-10-CM | POA: Diagnosis not present

## 2024-04-15 DIAGNOSIS — Z08 Encounter for follow-up examination after completed treatment for malignant neoplasm: Secondary | ICD-10-CM | POA: Diagnosis not present

## 2024-04-17 ENCOUNTER — Ambulatory Visit: Admitting: Podiatry

## 2024-04-17 DIAGNOSIS — M65311 Trigger thumb, right thumb: Secondary | ICD-10-CM | POA: Diagnosis not present

## 2024-04-17 DIAGNOSIS — M1812 Unilateral primary osteoarthritis of first carpometacarpal joint, left hand: Secondary | ICD-10-CM | POA: Diagnosis not present

## 2024-04-29 ENCOUNTER — Ambulatory Visit: Admitting: Podiatry

## 2024-05-06 ENCOUNTER — Ambulatory Visit: Admitting: Podiatry

## 2024-05-07 DIAGNOSIS — M545 Low back pain, unspecified: Secondary | ICD-10-CM | POA: Diagnosis not present

## 2024-05-09 ENCOUNTER — Other Ambulatory Visit (HOSPITAL_COMMUNITY): Payer: Self-pay

## 2024-05-09 DIAGNOSIS — M65311 Trigger thumb, right thumb: Secondary | ICD-10-CM | POA: Diagnosis not present

## 2024-05-09 MED ORDER — TRAMADOL HCL 50 MG PO TABS
50.0000 mg | ORAL_TABLET | ORAL | 0 refills | Status: AC
  Filled 2024-05-09: qty 50, 9d supply, fill #0

## 2024-05-09 MED ORDER — DOXYCYCLINE HYCLATE 100 MG PO CAPS
100.0000 mg | ORAL_CAPSULE | Freq: Two times a day (BID) | ORAL | 0 refills | Status: AC
  Filled 2024-05-09: qty 14, 7d supply, fill #0

## 2024-05-23 DIAGNOSIS — M25641 Stiffness of right hand, not elsewhere classified: Secondary | ICD-10-CM | POA: Diagnosis not present

## 2024-05-26 DIAGNOSIS — M545 Low back pain, unspecified: Secondary | ICD-10-CM | POA: Diagnosis not present

## 2024-06-04 DIAGNOSIS — M4180 Other forms of scoliosis, site unspecified: Secondary | ICD-10-CM | POA: Diagnosis not present

## 2024-06-11 ENCOUNTER — Telehealth (INDEPENDENT_AMBULATORY_CARE_PROVIDER_SITE_OTHER): Payer: Self-pay | Admitting: Gastroenterology

## 2024-06-11 ENCOUNTER — Encounter: Payer: Self-pay | Admitting: *Deleted

## 2024-06-11 NOTE — Progress Notes (Unsigned)
 Cardiology Office Note    Patient Name: [name] Date of Encounter: 06/11/2024  Primary Care Provider:  Shona Norleen PEDLAR, MD Primary Cardiologist:  Lonni LITTIE Nanas, MD Primary Electrophysiologist: None   Past Medical History    Past Medical History:  Diagnosis Date   Anemia    hx   Anxiety    CAD (coronary artery disease)    a. 12/2012 NSTEMI/Cath: LM anomalous, arising in R cusp ant to RCA, otw nl, LAD 62m with bridge, RI nl, LCX nl, OM2 small, subtl occl w/ thrombus distally - felt to be spont dissection, too small for PCI->Med Rx, RCA large, dom, nl, PDA/PD nl, EF 55% w/ focal HK in mid-dist antlat wall;  b. 05/2013 [facility] CL: EF 77%, old small inferolat scar, no ischemia.   CHF (congestive heart failure) (HCC)    Common migraine with intractable migraine 06/16/2017   Complication of anesthesia    dificulty waking up   Depression    Fibromyalgia    GERD (gastroesophageal reflux disease)    hasn't started taking any meds   History of blood transfusion    60yrs ago   History of kidney stones    Hx of colonic polyps    IBS (irritable bowel syndrome)    constipation   Joint pain    Joint swelling    Migraine    q other week (02/04/2016)   Myocardial infarction (HCC) 2014   Numbness    right leg   Polycythemia    PONV (postoperative nausea and vomiting)    Raynaud's disease    Sciatica of right side 04/21/2020   Scoliosis    Shortness of breath dyspnea    with exertion   SI (sacroiliac) joint dysfunction    right   Wears glasses     History of Present Illness  Stacey Lawrence is a 73 y.o. 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  [name] was seen initially in 2014 with complaint of chest pain was diagnosed with NSTEMI with left heart cath showing spontaneous coronary artery dissection small OM 2 that was treated  medically.  She was also found to have mid LAD lesion with myocardial bridging.  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 Echocardiogram showed normal left ventricular EF, 55- 60%, and normal wall motion.  No significant valvular abnormalities and no pericardial effusion.  He had metoprolol  discontinued due to history of Raynaud's and migraine headaches.  She had Cardizem  added for for prevention of coronary vasospasm and microvascular angina.  She was seen initially by Dr. Nanas on 11/25/2021 to establish care after being followed by Dr. Charls.  Visit she reported episodes of heaviness in the chest with heart racing and occasional shortness of breath with lightheadedness.  An exercise Myoview  which showed good exercise capacity and normal perfusion with fixed mid inferior lateral perfusion defect with no wall motion abnormalities.  She had coronary calcium  score completed that was 0 and 2D echo was repeated showing normal LV function with no significant valve abnormalities. Zio patch x 14 days 02/2022 showed 1 episode of NSVT lasting 4 beats.  She was last seen on 12/28/2022 for follow-up and reported some  chest pain which she attributed to indigestion.  She was walking up to 3 miles a day and denied any exertional chest pain or dyspnea.  Patient denies chest pain, palpitations, dyspnea, PND, orthopnea, nausea, vomiting, dizziness, syncope, edema, weight gain, or early satiety.   Discussed the use of AI scribe software for clinical note transcription with the patient, who gave verbal consent to proceed.  History of Present Illness    ***Notes:   Review of Systems  Please see the history of present illness.    All other systems reviewed and are otherwise negative except as noted above.  Physical Exam    Wt Readings from Last 3  Encounters:  02/20/24 137 lb 1.6 oz (62.2 kg)  10/30/23 132 lb 3.2 oz (60 kg)  04/24/23 135 lb (61.2 kg)   CD:Uyzmz were no vitals filed for this visit.,There is no height or weight on file to calculate BMI. GEN: Well nourished, well developed in no acute distress Neck: No JVD; No carotid bruits Pulmonary: Clear to auscultation without rales, wheezing or rhonchi  Cardiovascular: Normal rate. Regular rhythm. Normal S1. Normal S2.   Murmurs: There is no murmur.  ABDOMEN: Soft, non-tender, non-distended EXTREMITIES:  No edema; No deformity   EKG/LABS/ Recent Cardiac Studies   ECG personally reviewed by me today - ***  Risk Assessment/Calculations:   {Does this patient have ATRIAL FIBRILLATION?:[phone]}      Lab Results  Component Value Date   WBC 5.0 04/29/2019   HGB 14.5 04/29/2019   HCT 46.8 (H) 04/29/2019   MCV 92.9 04/29/2019   PLT 247 04/29/2019   Lab Results  Component Value Date   CREATININE 0.94 04/29/2019   BUN 12 04/29/2019   NA 142 04/29/2019   K 3.7 04/29/2019   CL 105 04/29/2019   CO2 27 04/29/2019   Lab Results  Component Value Date   CHOL 176 01/02/2019   HDL 80 01/02/2019   LDLCALC 85 01/02/2019   TRIG 53 01/02/2019   CHOLHDL 2.2 01/02/2019    Lab Results  Component Value Date   HGBA1C 5.7 (H) 10/20/2014   Assessment & Plan    Assessment and Plan Assessment & Plan     1.  History of CAD  2.  Chest pain  3.  Palpitations  4.  Hyperlipidemia      Disposition: Follow-up with Lonni LITTIE Nanas, MD or APP in *** months {Are you ordering a CV Procedure (e.g. stress test, cath, DCCV, TEE, etc)?   Press F2        :[id number]}   Signed, Wyn Raddle, Jackee Shove, NP 06/11/2024, 7:23 PM [facility]

## 2024-06-11 NOTE — Telephone Encounter (Signed)
 Pt left voicemail stating that she has a history of bowel obstruction, hard to eat without having pain accompanying, pain is getting worse instead of better, constipated even after taking all prescribed meds. Pt states she has tried all medications including exlax and other OTC products. Pt also drank an entire bottle of mag citrate. Last BM was Sunday afternoon. Pt drank a sprite this afternoon and burped about 10 times, that helped a little bit but pain came right back. Pain located at top of sternum going to left lower side. Pt states she is getting worse instead of better. History of ulcers as a child. Pt states pain is worse 4 days after mag citrate. No nausea or vomiting but is having a terrible headache. Pt last seen in June. Please advise. Thank you

## 2024-06-11 NOTE — Telephone Encounter (Signed)
 Pt contacted. Pt states she knows this is not her heart. She has had 3 heart attacks. She knows this is her indigestion. Reiterated provider recommendations to pt; pt states she will get checked out but it will not be at the ER at [facility] or [facility]. Pt states they have facility fees of $2000 and they have paid out $5000 over the past 4 months for her husband. Pt states she will go to her cardiologist.

## 2024-06-12 ENCOUNTER — Encounter: Payer: Self-pay | Admitting: Nurse Practitioner

## 2024-06-12 ENCOUNTER — Ambulatory Visit: Attending: Cardiology | Admitting: Nurse Practitioner

## 2024-06-12 VITALS — BP 90/58 | HR 79 | Ht 66.0 in | Wt 132.8 lb

## 2024-06-12 DIAGNOSIS — R002 Palpitations: Secondary | ICD-10-CM | POA: Diagnosis not present

## 2024-06-12 DIAGNOSIS — E785 Hyperlipidemia, unspecified: Secondary | ICD-10-CM | POA: Diagnosis not present

## 2024-06-12 DIAGNOSIS — I2542 Coronary artery dissection: Secondary | ICD-10-CM

## 2024-06-12 DIAGNOSIS — Z01810 Encounter for preprocedural cardiovascular examination: Secondary | ICD-10-CM | POA: Diagnosis not present

## 2024-06-12 DIAGNOSIS — R079 Chest pain, unspecified: Secondary | ICD-10-CM

## 2024-06-12 NOTE — Telephone Encounter (Signed)
 Noted. Advised pt in yesterdays conversation.

## 2024-06-12 NOTE — Patient Instructions (Signed)
 Medication Instructions:  None *If you need a refill on your cardiac medications before your next appointment, please call your pharmacy*  Lab Work: None If you have labs (blood work) drawn today and your tests are completely normal, you will receive your results only by: MyChart Message (if you have MyChart) OR A paper copy in the mail If you have any lab test that is abnormal or we need to change your treatment, we will call you to review the results.  Testing/Procedures: None  Follow-Up: At [facility], you and your health needs are our priority.  As part of our continuing mission to provide you with exceptional heart care, our providers are all part of one team.  This team includes your primary Cardiologist (physician) and Advanced Practice Providers or APPs (Physician Assistants and Nurse Practitioners) who all work together to provide you with the care you need, when you need it.  Your next appointment:   1 year(s)  Provider:   Lonni LITTIE Nanas, MD  We recommend signing up for the patient portal called MyChart.  Sign up information is provided on this After Visit Summary.  MyChart is used to connect with patients for Virtual Visits (Telemedicine).  Patients are able to view lab/test results, encounter notes, upcoming appointments, etc.  Non-urgent messages can be sent to your provider as well.   To learn more about what you can do with MyChart, go to [email or web address].   Other Instructions None

## 2024-06-14 DIAGNOSIS — M1812 Unilateral primary osteoarthritis of first carpometacarpal joint, left hand: Secondary | ICD-10-CM | POA: Diagnosis not present

## 2024-06-17 DIAGNOSIS — N9489 Other specified conditions associated with female genital organs and menstrual cycle: Secondary | ICD-10-CM | POA: Diagnosis not present

## 2024-06-24 ENCOUNTER — Ambulatory Visit (INDEPENDENT_AMBULATORY_CARE_PROVIDER_SITE_OTHER): Admitting: Gastroenterology

## 2024-06-24 ENCOUNTER — Other Ambulatory Visit (HOSPITAL_COMMUNITY): Payer: Self-pay

## 2024-06-24 VITALS — BP 106/67 | HR 71 | Temp 97.5°F | Ht 66.0 in | Wt 131.8 lb

## 2024-06-24 DIAGNOSIS — R109 Unspecified abdominal pain: Secondary | ICD-10-CM | POA: Diagnosis not present

## 2024-06-24 DIAGNOSIS — K5900 Constipation, unspecified: Secondary | ICD-10-CM | POA: Diagnosis not present

## 2024-06-24 DIAGNOSIS — K581 Irritable bowel syndrome with constipation: Secondary | ICD-10-CM

## 2024-06-24 DIAGNOSIS — K219 Gastro-esophageal reflux disease without esophagitis: Secondary | ICD-10-CM | POA: Diagnosis not present

## 2024-06-24 DIAGNOSIS — R1310 Dysphagia, unspecified: Secondary | ICD-10-CM

## 2024-06-24 DIAGNOSIS — K5732 Diverticulitis of large intestine without perforation or abscess without bleeding: Secondary | ICD-10-CM | POA: Diagnosis not present

## 2024-06-24 MED ORDER — OMEPRAZOLE 40 MG PO CPDR
40.0000 mg | DELAYED_RELEASE_CAPSULE | Freq: Two times a day (BID) | ORAL | 3 refills | Status: AC
  Filled 2024-06-24: qty 180, 90d supply, fill #0
  Filled 2024-10-02: qty 180, 90d supply, fill #1
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
## 2024-06-24 NOTE — Patient Instructions (Signed)
-  let me know if you wish to schedule colonoscopy -continue omeprazole , increase to BID -continue famotidine  20mg  nightly -consider esophageal manometry/ph impedence testing if reflux/dysphagia persists -continue amitiza  24mcg twice daily -miralax 1 capful daily -Increase water  intake, aim for atleast 64 oz per day -Increase fruits, veggies and whole grains, kiwi and prunes are especially good for constipation  Follow up 3 months  It was a pleasure to see you today. I want to create trusting relationships with patients and provide genuine, compassionate, and quality care. I truly value your feedback! please be on the lookout for a survey regarding your visit with me today. I appreciate your input about our visit and your time in completing this!    Zaydee Aina L. Kely Dohn, MSN, APRN, AGNP-C Adult-Gerontology Nurse Practitioner [facility]

## 2024-07-01 ENCOUNTER — Other Ambulatory Visit (HOSPITAL_COMMUNITY): Payer: Self-pay

## 2024-07-01 DIAGNOSIS — M5416 Radiculopathy, lumbar region: Secondary | ICD-10-CM | POA: Diagnosis not present

## 2024-07-03 ENCOUNTER — Encounter (INDEPENDENT_AMBULATORY_CARE_PROVIDER_SITE_OTHER): Payer: Self-pay | Admitting: Gastroenterology

## 2024-07-04 DIAGNOSIS — Z23 Encounter for immunization: Secondary | ICD-10-CM | POA: Diagnosis not present

## 2024-07-08 DIAGNOSIS — M5416 Radiculopathy, lumbar region: Secondary | ICD-10-CM | POA: Diagnosis not present

## 2024-07-10 ENCOUNTER — Telehealth (INDEPENDENT_AMBULATORY_CARE_PROVIDER_SITE_OTHER): Payer: Self-pay

## 2024-07-10 NOTE — Telephone Encounter (Signed)
 Patient left vm, she is now wanting to schedule colonoscopy. Please advise regarding procedure orders.

## 2024-07-15 ENCOUNTER — Other Ambulatory Visit (HOSPITAL_COMMUNITY): Payer: Self-pay

## 2024-07-15 MED ORDER — PEG 3350-KCL-NA BICARB-NACL 420 G PO SOLR
4000.0000 mL | Freq: Once | ORAL | 0 refills | Status: AC
  Filled 2024-07-15: qty 4000, 1d supply, fill #0

## 2024-07-15 NOTE — Addendum Note (Signed)
 Addended by: JEANELL GRAEME RAMAN on: 07/15/2024 02:58 PM   Modules accepted: Orders

## 2024-07-15 NOTE — Telephone Encounter (Addendum)
 Spoke with pt. She has been scheduled for 12/2. Aware will send prep instructions and rx for prep to pharmacy.   Per cohere website for PA Information about your requested care Prior authorization is not required for this code. If you would like to submit this code for review, please login to Availity, or contact [employer]; for Medicare call [phone], for Commercial call [phone]

## 2024-07-16 DIAGNOSIS — M5451 Vertebrogenic low back pain: Secondary | ICD-10-CM | POA: Diagnosis not present

## 2024-07-16 DIAGNOSIS — M4180 Other forms of scoliosis, site unspecified: Secondary | ICD-10-CM | POA: Diagnosis not present

## 2024-07-16 DIAGNOSIS — M5416 Radiculopathy, lumbar region: Secondary | ICD-10-CM | POA: Diagnosis not present

## 2024-07-19 ENCOUNTER — Other Ambulatory Visit (HOSPITAL_COMMUNITY): Payer: Self-pay

## 2024-07-22 ENCOUNTER — Other Ambulatory Visit (HOSPITAL_COMMUNITY): Payer: Self-pay

## 2024-07-23 ENCOUNTER — Other Ambulatory Visit (HOSPITAL_COMMUNITY): Payer: Self-pay | Admitting: Internal Medicine

## 2024-07-23 DIAGNOSIS — Z1231 Encounter for screening mammogram for malignant neoplasm of breast: Secondary | ICD-10-CM

## 2024-07-26 DIAGNOSIS — E782 Mixed hyperlipidemia: Secondary | ICD-10-CM | POA: Diagnosis not present

## 2024-07-28 LAB — LAB REPORT - SCANNED
A1c: 5.7
Albumin, Urine POC: 7.9
Creatinine, POC: 105.1 mg/dL
EGFR: 67
Microalb Creat Ratio: 8

## 2024-08-02 DIAGNOSIS — R1310 Dysphagia, unspecified: Secondary | ICD-10-CM | POA: Diagnosis not present

## 2024-08-02 DIAGNOSIS — K219 Gastro-esophageal reflux disease without esophagitis: Secondary | ICD-10-CM | POA: Diagnosis not present

## 2024-08-02 DIAGNOSIS — G43009 Migraine without aura, not intractable, without status migrainosus: Secondary | ICD-10-CM | POA: Diagnosis not present

## 2024-08-02 DIAGNOSIS — Z8679 Personal history of other diseases of the circulatory system: Secondary | ICD-10-CM | POA: Diagnosis not present

## 2024-08-02 DIAGNOSIS — R7301 Impaired fasting glucose: Secondary | ICD-10-CM | POA: Diagnosis not present

## 2024-08-02 DIAGNOSIS — F332 Major depressive disorder, recurrent severe without psychotic features: Secondary | ICD-10-CM | POA: Diagnosis not present

## 2024-08-02 DIAGNOSIS — E782 Mixed hyperlipidemia: Secondary | ICD-10-CM | POA: Diagnosis not present

## 2024-08-02 DIAGNOSIS — R634 Abnormal weight loss: Secondary | ICD-10-CM | POA: Diagnosis not present

## 2024-08-02 DIAGNOSIS — R251 Tremor, unspecified: Secondary | ICD-10-CM | POA: Diagnosis not present

## 2024-08-12 NOTE — Telephone Encounter (Signed)
 Patient called needing to reschedule due to having to go out of town for a sick family member. Rescheduled colonoscopy to 08/30/2024 at 10:30 am. Sending updated instructions though the mail.

## 2024-08-14 ENCOUNTER — Encounter (HOSPITAL_COMMUNITY)

## 2024-08-19 DIAGNOSIS — R519 Headache, unspecified: Secondary | ICD-10-CM | POA: Diagnosis not present

## 2024-08-19 DIAGNOSIS — J029 Acute pharyngitis, unspecified: Secondary | ICD-10-CM | POA: Diagnosis not present

## 2024-08-26 ENCOUNTER — Ambulatory Visit (HOSPITAL_COMMUNITY)

## 2024-08-27 ENCOUNTER — Encounter (HOSPITAL_COMMUNITY)
Admission: RE | Admit: 2024-08-27 | Discharge: 2024-08-27 | Disposition: A | Source: Ambulatory Visit | Attending: Gastroenterology

## 2024-08-27 NOTE — Telephone Encounter (Signed)
 Patient called highly upset stating that prior authorization was not completed for her colonoscopy and that is scheduled on Friday. I have went on Cohere and typed all the information again and received the same message of Prior authorization is not required for this code. If you would like to submit this code for review, please login to Availity, or contact [employer]; for Medicare call [phone], for Commercial call [phone]. I gave the patient the number to the billing department as I do not control what she is billed for.

## 2024-08-28 ENCOUNTER — Other Ambulatory Visit: Payer: Self-pay

## 2024-08-28 ENCOUNTER — Encounter (HOSPITAL_COMMUNITY): Payer: Self-pay

## 2024-08-30 ENCOUNTER — Encounter (HOSPITAL_COMMUNITY): Payer: Self-pay | Admitting: Gastroenterology

## 2024-08-30 ENCOUNTER — Encounter (HOSPITAL_COMMUNITY): Admission: RE | Disposition: A | Payer: Self-pay | Source: Home / Self Care | Attending: Gastroenterology

## 2024-08-30 ENCOUNTER — Ambulatory Visit (HOSPITAL_COMMUNITY): Admitting: Anesthesiology

## 2024-08-30 ENCOUNTER — Ambulatory Visit (HOSPITAL_COMMUNITY)
Admission: RE | Admit: 2024-08-30 | Discharge: 2024-08-30 | Disposition: A | Attending: Gastroenterology | Admitting: Gastroenterology

## 2024-08-30 DIAGNOSIS — K5730 Diverticulosis of large intestine without perforation or abscess without bleeding: Secondary | ICD-10-CM

## 2024-08-30 DIAGNOSIS — D120 Benign neoplasm of cecum: Secondary | ICD-10-CM | POA: Diagnosis not present

## 2024-08-30 DIAGNOSIS — D122 Benign neoplasm of ascending colon: Secondary | ICD-10-CM | POA: Diagnosis not present

## 2024-08-30 DIAGNOSIS — Z79899 Other long term (current) drug therapy: Secondary | ICD-10-CM | POA: Diagnosis not present

## 2024-08-30 DIAGNOSIS — I25119 Atherosclerotic heart disease of native coronary artery with unspecified angina pectoris: Secondary | ICD-10-CM

## 2024-08-30 DIAGNOSIS — D649 Anemia, unspecified: Secondary | ICD-10-CM | POA: Diagnosis not present

## 2024-08-30 DIAGNOSIS — K648 Other hemorrhoids: Secondary | ICD-10-CM

## 2024-08-30 DIAGNOSIS — F419 Anxiety disorder, unspecified: Secondary | ICD-10-CM | POA: Diagnosis not present

## 2024-08-30 DIAGNOSIS — F32A Depression, unspecified: Secondary | ICD-10-CM | POA: Diagnosis not present

## 2024-08-30 DIAGNOSIS — I509 Heart failure, unspecified: Secondary | ICD-10-CM | POA: Diagnosis not present

## 2024-08-30 DIAGNOSIS — K635 Polyp of colon: Secondary | ICD-10-CM | POA: Diagnosis not present

## 2024-08-30 DIAGNOSIS — R0602 Shortness of breath: Secondary | ICD-10-CM | POA: Diagnosis not present

## 2024-08-30 DIAGNOSIS — I110 Hypertensive heart disease with heart failure: Secondary | ICD-10-CM | POA: Diagnosis not present

## 2024-08-30 DIAGNOSIS — R519 Headache, unspecified: Secondary | ICD-10-CM | POA: Diagnosis not present

## 2024-08-30 DIAGNOSIS — K219 Gastro-esophageal reflux disease without esophagitis: Secondary | ICD-10-CM | POA: Diagnosis not present

## 2024-08-30 DIAGNOSIS — I252 Old myocardial infarction: Secondary | ICD-10-CM | POA: Diagnosis not present

## 2024-08-30 LAB — HM COLONOSCOPY

## 2024-08-30 SURGERY — COLONOSCOPY
Anesthesia: Monitor Anesthesia Care

## 2024-08-30 MED ORDER — LIDOCAINE 2% (20 MG/ML) 5 ML SYRINGE
INTRAMUSCULAR | Status: DC | PRN
  Administered 2024-08-30: 60 mg via INTRAVENOUS

## 2024-08-30 MED ORDER — LACTATED RINGERS IV SOLN: INTRAVENOUS | Status: DC

## 2024-08-30 MED ORDER — PHENYLEPHRINE 80 MCG/ML (10ML) SYRINGE FOR IV PUSH (FOR BLOOD PRESSURE SUPPORT)
PREFILLED_SYRINGE | INTRAVENOUS | Status: DC | PRN
  Administered 2024-08-30: 160 ug via INTRAVENOUS
  Administered 2024-08-30: 80 ug via INTRAVENOUS
  Administered 2024-08-30: 160 ug via INTRAVENOUS
  Administered 2024-08-30: 80 ug via INTRAVENOUS
  Administered 2024-08-30: 160 ug via INTRAVENOUS
  Administered 2024-08-30: 80 ug via INTRAVENOUS

## 2024-08-30 MED ORDER — PROPOFOL 500 MG/50ML IV EMUL
INTRAVENOUS | Status: DC | PRN
  Administered 2024-08-30: 70 mg via INTRAVENOUS
  Administered 2024-08-30: 125 ug/kg/min via INTRAVENOUS

## 2024-08-30 MED ORDER — EPHEDRINE SULFATE (PRESSORS) 25 MG/5ML IV SOSY
PREFILLED_SYRINGE | INTRAVENOUS | Status: DC | PRN
  Administered 2024-08-30: 10 mg via INTRAVENOUS

## 2024-08-30 NOTE — Transfer of Care (Signed)
 Immediate Anesthesia Transfer of Care Note  Patient: Stacey Lawrence  Procedure(s) Performed: COLONOSCOPY  Patient Location: Short Stay  Anesthesia Type:MAC  Level of Consciousness: drowsy and patient cooperative  Airway & Oxygen Therapy: Patient Spontanous Breathing  Post-op Assessment: Report given to RN and Post -op Vital signs reviewed and stable  Post vital signs: Reviewed and stable  Last Vitals:  Vitals Value Taken Time  BP 126/50 08/30/24   1135  Temp 98.1 08/30/24   1135  Pulse 65 08/30/24   1135  Resp 17 08/30/24   1135  SpO2 99% 08/30/24   1135    Last Pain:  Vitals:   08/30/24 0920  PainSc: 0-No pain         Complications: No notable events documented.

## 2024-08-30 NOTE — H&P (Signed)
 Stacey Lawrence is an 73 y.o. female.   Chief Complaint: diverticulitis HPI: 73 year old female with past medical history of coronary artery disease and NSTEMI status post stent placement, anxiety, fibromyalgia, CHF, depression, GERD, IBS, coming for history of diverticulitis.  Patient reports having some intermittent abdominal pain episodes in the past.  No nausea, vomiting, fever, chills, diarrhea, abdominal distention, melena or hematochezia.  Mother was diagnosed with colon cancer at age 18.  Last colonoscopy in 2022, had 1 tubular adenoma removed.  Past Medical History:  Diagnosis Date   Anemia    hx   Anxiety    CAD (coronary artery disease)    a. 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 05/2013 Lexi CL: EF 77%, old small inferolat scar, no ischemia.   CHF (congestive heart failure) (HCC)    Common migraine with intractable migraine 06/16/2017   Complication of anesthesia    dificulty waking up   Depression    Fibromyalgia    GERD (gastroesophageal reflux disease)    hasn't started taking any meds   History of blood transfusion    24yrs ago   Hx of colonic polyps    IBS (irritable bowel syndrome)    constipation   Joint pain    Joint swelling    Migraine    q other week (02/04/2016)   Myocardial infarction (HCC) 2014   Numbness    right leg   Polycythemia    PONV (postoperative nausea and vomiting)    Raynaud's disease    Sciatica of right side 04/21/2020   Scoliosis    Shortness of breath dyspnea    with exertion   SI (sacroiliac) joint dysfunction    right   Wears glasses     Past Surgical History:  Procedure Laterality Date   ABDOMINAL HYSTERECTOMY     partial   APPENDECTOMY     with explor. lap.   BIOPSY   05/31/2022   Procedure: BIOPSY;  Surgeon: Eartha Angelia Sieving, MD;  Location: [facility];  Service: Gastroenterology;;   CARDIAC CATHETERIZATION     CHOLECYSTECTOMY  10/28/2002   lap.   COLONOSCOPY     COLONOSCOPY WITH PROPOFOL  N/A 08/11/2021   Procedure: COLONOSCOPY WITH PROPOFOL ;  Surgeon: Golda Claudis PENNER, MD;  Location: [facility];  Service: Endoscopy;  Laterality: N/A;  8:30   DEBRIDEMENT TENNIS ELBOW     DIAGNOSTIC LAPAROSCOPY     DILATION AND CURETTAGE OF UTERUS     ELBOW SURGERY     golfer's elbow-bilateral   ESOPHAGEAL DILATION N/A 05/31/2022   Procedure: ESOPHAGEAL DILATION;  Surgeon: Eartha Angelia Sieving, MD;  Location: [facility];  Service: Gastroenterology;  Laterality: N/A;   ESOPHAGOGASTRODUODENOSCOPY  09/10/2012   Procedure: ESOPHAGOGASTRODUODENOSCOPY (EGD);  Surgeon: Claudis PENNER Golda, MD;  Location: [facility];  Service: Endoscopy;  Laterality: N/A;  730   ESOPHAGOGASTRODUODENOSCOPY (EGD) WITH PROPOFOL  N/A 05/31/2022   Procedure: ESOPHAGOGASTRODUODENOSCOPY (EGD) WITH PROPOFOL ;  Surgeon: Eartha Angelia Sieving, MD;  Location: [facility];  Service: Gastroenterology;  Laterality: N/A;  205 ASA 3   EXCISION/RELEASE BURSA HIP Left 02/04/2016   Procedure: LEFT HIP TROCHANTERIC BURSECTOMY;  Surgeon: Lonni CINDERELLA Poli, MD;  Location: [facility] OR;  Service:  Orthopedics;  Laterality: Left;   I & D EXTREMITY  02/25/2012   Procedure: IRRIGATION AND DEBRIDEMENT EXTREMITY;  Surgeon: Elsie Mussel, MD;  Location: [facility] OR;  Service: Orthopedics;  Laterality: Right;  Irrigation and Debridement and Repair Right Thumb Nerve Laceration and Assoiated Structures    KNEE DEBRIDEMENT     right   LAPAROTOMY     LEFT HEART CATHETERIZATION WITH CORONARY ANGIOGRAM N/A 01/16/2013   Procedure: LEFT HEART CATHETERIZATION WITH CORONARY ANGIOGRAM;  Surgeon: Toribio JONELLE Fuel, MD;  Location[facility] C[facility];  Service: Cardiovascular;  Laterality: N/A;   POLYPECTOMY  08/11/2021    Procedure: POLYPECTOMY;  Surgeon: Golda Claudis PENNER, MD;  Location: [facility];  Service: Endoscopy;;   SACROILIAC JOINT FUSION Right 05/02/2019   Procedure: SACROILIAC JOINT FUSION;  Surgeon: Burnetta Aures, MD;  Location: [facility] OR;  Service: Orthopedics;  Laterality: Right;  SACROILIAC JOINT FUSION   STAPEDECTOMY     right   TONSILLECTOMY AND ADENOIDECTOMY     TRIGGER FINGER RELEASE  08/19/2011   Procedure: RELEASE TRIGGER FINGER/A-1 PULLEY;  Surgeon: Elsie CHRISTELLA Mussel III;  Location: [facility];  Service: Orthopedics;  Laterality: Right;  right middle finger a-1 pulley release with tenosynovectomy   TRIGGER FINGER RELEASE     x 5   TROCHANTERIC BURSA EXCISION Right 02/04/2016   TUBAL LIGATION     TYMPANOPLASTY      Family History  Problem Relation Age of Onset   Cancer Mother        Deceased with lung CA   Heart failure Mother    Heart disease Mother    COPD Mother    Varicose Veins Mother    Depression Mother    Cancer Father        Deceased with lung CA   Heart disease Father    Varicose Veins Father    Depression Father    Depression Brother    Lung cancer Brother    ADD / ADHD Son    Parkinson's disease Sister    Other Sister        enlarged heart    Tremor Maternal Grandmother    Parkinson's disease Paternal Grandfather    Lung cancer Other        3 aunts, 2 uncles on both sides of family    Social History:  reports that she has never smoked. She has never been exposed to tobacco smoke. She has never used smokeless tobacco. She reports that she does not drink alcohol and does not use drugs.  Allergies: Allergies[1]  Medications Prior to Admission  Medication Sig Dispense Refill   BIOTIN PO Take 1 tablet by mouth daily.     Cholecalciferol (VITAMIN D3) 50 MCG (2000 UT) TABS Take 2,000 Units by mouth daily.     clotrimazole -betamethasone  (LOTRISONE ) cream Apply topically once daily as directed. 30 g 0   estradiol  (ESTRACE  VAGINAL) 0.1 MG/GM vaginal  cream Place 1 Applicatorful vaginally every other day.     famotidine  (PEPCID ) 20 MG tablet Take 1 tablet (20 mg total) by mouth at bedtime. 60 tablet 1   fexofenadine-pseudoephedrine (ALLEGRA-D) 60-120 MG 12 hr tablet Take 1 tablet by mouth 2 (two) times daily.     lubiprostone  (AMITIZA ) 24 MCG capsule Take 1 capsule (24 mcg total) by mouth 2 (two) times daily with a meal. 60 capsule 1   mirabegron  ER (MYRBETRIQ ) 50 MG TB24 tablet Take 50 mg by mouth daily.     omeprazole  (PRILOSEC) 40 MG  capsule Take 1 capsule (40 mg total) by mouth 2 (two) times daily. 180 capsule 3   Pseudoeph-Doxylamine-DM-APAP (NYQUIL PO) Take by mouth.     traMADol  (ULTRAM ) 50 MG tablet Take 1 tablet (50 mg total) by mouth every 4-6 hours. 50 tablet 0   Turmeric (QC TUMERIC COMPLEX PO) Take 1,500 mg by mouth daily.     doxycycline  (VIBRAMYCIN ) 100 MG capsule Take 1 capsule (100 mg total) by mouth 2 (two) times daily for 7 days. 14 capsule 0    No results found for this or any previous visit (from the past 48 hours). No results found.  Review of Systems  Blood pressure (!) 123/58, temperature 98.2 F (36.8 C), resp. rate 14, SpO2 99%. Physical Exam  GENERAL: The patient is AO x3, in no acute distress. HEENT: Head is normocephalic and atraumatic. EOMI are intact. Mouth is well hydrated and without lesions. NECK: Supple. No masses LUNGS: Clear to auscultation. No presence of rhonchi/wheezing/rales. Adequate chest expansion HEART: RRR, normal s1 and s2. ABDOMEN: Soft, nontender, no guarding, no peritoneal signs, and nondistended. BS +. No masses. EXTREMITIES: Without any cyanosis, clubbing, rash, lesions or edema. NEUROLOGIC: AOx3, no focal motor deficit. SKIN: no jaundice, no rashes  Assessment/Plan 73 year old female with past medical history of coronary artery disease and NSTEMI status post stent placement, anxiety, fibromyalgia, CHF, depression, GERD, IBS, coming for history of diverticulitis.  Will proceed with  colonoscopy  Toribio Eartha Flavors, MD 08/30/2024, 9:46 AM       [1]  Allergies Allergen Reactions   Dilaudid  [Hydromorphone  Hcl] Other (See Comments)    Resp. Arrest Body drop in temperature   Codeine Nausea And Vomiting, Rash and Dermatitis    codeine   Morphine And Codeine Nausea And Vomiting

## 2024-08-30 NOTE — Discharge Instructions (Signed)
 You are being discharged to home.  Resume your previous diet.  We are waiting for your pathology results.  Your physician has recommended a repeat colonoscopy for surveillance based on pathology results.

## 2024-08-30 NOTE — Anesthesia Preprocedure Evaluation (Signed)
 Anesthesia Evaluation  Patient identified by MRN, date of birth, ID band Patient awake    Reviewed: Allergy & Precautions, H&P , NPO status , Patient's Chart, lab work & pertinent test results, reviewed documented beta blocker date and time   History of Anesthesia Complications (+) PONV and history of anesthetic complications  Airway Mallampati: II  TM Distance: >3 FB Neck ROM: full    Dental no notable dental hx.    Pulmonary neg pulmonary ROS, shortness of breath   Pulmonary exam normal breath sounds clear to auscultation       Cardiovascular Exercise Tolerance: Good hypertension, + angina  + CAD, + Past MI and +CHF  negative cardio ROS  Rhythm:regular Rate:Normal     Neuro/Psych  Headaches PSYCHIATRIC DISORDERS Anxiety Depression     Neuromuscular disease negative neurological ROS  negative psych ROS   GI/Hepatic negative GI ROS, Neg liver ROS,GERD  ,,  Endo/Other  negative endocrine ROS    Renal/GU negative Renal ROS  negative genitourinary   Musculoskeletal   Abdominal   Peds  Hematology negative hematology ROS (+) Blood dyscrasia, anemia   Anesthesia Other Findings   Reproductive/Obstetrics negative OB ROS                              Anesthesia Physical Anesthesia Plan  ASA: 3  Anesthesia Plan: MAC   Post-op Pain Management:    Induction:   PONV Risk Score and Plan: Propofol  infusion  Airway Management Planned:   Additional Equipment:   Intra-op Plan:   Post-operative Plan:   Informed Consent: I have reviewed the patients History and Physical, chart, labs and discussed the procedure including the risks, benefits and alternatives for the proposed anesthesia with the patient or authorized representative who has indicated his/her understanding and acceptance.     Dental Advisory Given  Plan Discussed with: CRNA  Anesthesia Plan Comments:          Anesthesia Quick Evaluation

## 2024-08-30 NOTE — Anesthesia Procedure Notes (Signed)
 Date/Time: 08/30/2024 10:58 AM  Performed by: Para Jerelene CROME, CRNAOxygen Delivery Method: Nasal cannula

## 2024-08-30 NOTE — Op Note (Signed)
 [facility] Patient Name: Stacey Lawrence Procedure Date: 08/30/2024 10:44 AM MRN: [id number] Date of Birth: 02-22-1951 Attending MD: Toribio Fortune , , [id number] CSN: [id number] Age: 73 Admit Type: Outpatient Procedure:                Colonoscopy Indications:              Follow-up of diverticulitis Providers:                Toribio Fortune, Olam Ada, RN, Gordy Lonni Balm, Technician Referring MD:              Medicines:                Monitored Anesthesia Care Complications:            No immediate complications. Estimated Blood Loss:     Estimated blood loss: none. Procedure:                Pre-Anesthesia Assessment:                           - Prior to the procedure, a History and Physical                            was performed, and patient medications, allergies                            and sensitivities were reviewed. The patient's                            tolerance of previous anesthesia was reviewed.                           - The risks and benefits of the procedure and the                            sedation options and risks were discussed with the                            patient. All questions were answered and informed                            consent was obtained.                           - ASA Grade Assessment: II - A patient with mild                            systemic disease.                           After obtaining informed consent, the colonoscope                            was passed under direct vision. Throughout the  procedure, the patient's blood pressure, pulse, and                            oxygen saturations were monitored continuously. The                            [id number] ([id number]) Peds Colon was introduced                            through the anus and advanced to the the cecum,                            identified by appendiceal orifice and ileocecal                             valve. The colonoscopy was performed without                            difficulty. The patient tolerated the procedure                            well. The quality of the bowel preparation was good. Scope In: 11:04:38 AM Scope Out: 11:25:28 AM Scope Withdrawal Time: 0 hours 14 minutes 0 seconds  Total Procedure Duration: 0 hours 20 minutes 50 seconds  Findings:      The perianal and digital rectal examinations were normal.      Two sessile polyps were found in the ascending colon and cecum. The       polyps were 3 to 5 mm in size. These polyps were removed with a cold       snare. Resection and retrieval were complete.      Multiple medium-mouthed and small-mouthed diverticula were found in the       sigmoid colon and descending colon.      Non-bleeding internal hemorrhoids were found during retroflexion. The       hemorrhoids were medium-sized. Impression:               - Two 3 to 5 mm polyps in the ascending colon and                            in the cecum, removed with a cold snare. Resected                            and retrieved.                           - Diverticulosis in the sigmoid colon and in the                            descending colon.                           - Non-bleeding internal hemorrhoids. Moderate Sedation:      Per Anesthesia Care Recommendation:           - Discharge patient to home (ambulatory).                           -  Resume previous diet.                           - Await pathology results.                           - Repeat colonoscopy for surveillance based on                            pathology results. Procedure Code(s):        --- Professional ---                           [id number], Colonoscopy, flexible; with removal of                            tumor(s), polyp(s), or other lesion(s) by snare                            technique Diagnosis Code(s):        --- Professional ---                           D12.2, Benign neoplasm of  ascending colon                           D12.0, Benign neoplasm of cecum                           K64.8, Other hemorrhoids                           K57.32, Diverticulitis of large intestine without                            perforation or abscess without bleeding                           K57.30, Diverticulosis of large intestine without                            perforation or abscess without bleeding CPT copyright 2022 [employer]. All rights reserved. The codes documented in this report are preliminary and upon coder review may  be revised to meet current compliance requirements. Toribio Fortune, MD Toribio Fortune,  08/30/2024 11:32:38 AM This report has been signed electronically. Number of Addenda: 0

## 2024-09-02 ENCOUNTER — Ambulatory Visit (INDEPENDENT_AMBULATORY_CARE_PROVIDER_SITE_OTHER): Payer: Self-pay | Admitting: Gastroenterology

## 2024-09-02 ENCOUNTER — Encounter (HOSPITAL_COMMUNITY): Payer: Self-pay | Admitting: Gastroenterology

## 2024-09-02 LAB — SURGICAL PATHOLOGY

## 2024-09-03 ENCOUNTER — Encounter (INDEPENDENT_AMBULATORY_CARE_PROVIDER_SITE_OTHER): Payer: Self-pay | Admitting: *Deleted

## 2024-09-05 NOTE — Anesthesia Postprocedure Evaluation (Signed)
 Anesthesia Post Note  Patient: SARABI SOCKWELL  Procedure(s) Performed: COLONOSCOPY  Patient location during evaluation: Phase II Anesthesia Type: MAC Level of consciousness: awake Pain management: pain level controlled Vital Signs Assessment: post-procedure vital signs reviewed and stable Respiratory status: spontaneous breathing and respiratory function stable Cardiovascular status: blood pressure returned to baseline and stable Postop Assessment: no headache and no apparent nausea or vomiting Anesthetic complications: no Comments: Late entry   No notable events documented.   Last Vitals:  Vitals:   08/30/24 0925 08/30/24 1138  BP: (!) 123/58 (!) 126/50  Pulse:  85  Resp: 14 16  Temp: 36.8 C 36.7 C  SpO2: 99% 98%    Last Pain:  Vitals:   09/02/24 1410  TempSrc:   PainSc: 0-No pain                 Yvonna JINNY Bosworth

## 2024-09-06 NOTE — Progress Notes (Signed)
 5 yr TCS noted in recall Patient result letter mailed procedure note and pathology result faxed to PCP

## 2024-10-02 ENCOUNTER — Other Ambulatory Visit (HOSPITAL_COMMUNITY): Payer: Self-pay

## 2024-10-02 ENCOUNTER — Other Ambulatory Visit: Payer: Self-pay | Admitting: Podiatry

## 2024-10-02 ENCOUNTER — Other Ambulatory Visit: Payer: Self-pay

## 2024-10-14 ENCOUNTER — Ambulatory Visit (HOSPITAL_COMMUNITY)

## 2024-10-16 ENCOUNTER — Ambulatory Visit (HOSPITAL_COMMUNITY)

## 2024-10-24 ENCOUNTER — Ambulatory Visit (HOSPITAL_COMMUNITY)
